# Patient Record
Sex: Male | Born: 1945 | Race: Black or African American | Hispanic: No | Smoking: Current every day smoker
Health system: Southern US, Community
[De-identification: ages and names within clinical notes are randomized; demographics above are authoritative.]

## PROBLEM LIST (undated history)

## (undated) DIAGNOSIS — B1920 Unspecified viral hepatitis C without hepatic coma: Secondary | ICD-10-CM

## (undated) DIAGNOSIS — E119 Type 2 diabetes mellitus without complications: Secondary | ICD-10-CM

## (undated) DIAGNOSIS — F209 Schizophrenia, unspecified: Secondary | ICD-10-CM

## (undated) DIAGNOSIS — I639 Cerebral infarction, unspecified: Secondary | ICD-10-CM

## (undated) HISTORY — PX: HERNIA REPAIR: SHX51

---

## 2006-07-08 ENCOUNTER — Emergency Department: Payer: Self-pay | Admitting: Emergency Medicine

## 2006-07-23 ENCOUNTER — Encounter: Payer: Self-pay | Admitting: Emergency Medicine

## 2008-06-28 ENCOUNTER — Emergency Department: Payer: Self-pay | Admitting: Internal Medicine

## 2008-07-31 ENCOUNTER — Emergency Department: Payer: Self-pay | Admitting: Emergency Medicine

## 2009-10-21 DIAGNOSIS — K219 Gastro-esophageal reflux disease without esophagitis: Secondary | ICD-10-CM | POA: Insufficient documentation

## 2012-07-31 DIAGNOSIS — G5603 Carpal tunnel syndrome, bilateral upper limbs: Secondary | ICD-10-CM | POA: Insufficient documentation

## 2012-07-31 DIAGNOSIS — M160 Bilateral primary osteoarthritis of hip: Secondary | ICD-10-CM | POA: Insufficient documentation

## 2012-07-31 DIAGNOSIS — E1142 Type 2 diabetes mellitus with diabetic polyneuropathy: Secondary | ICD-10-CM | POA: Insufficient documentation

## 2012-10-23 DIAGNOSIS — F1990 Other psychoactive substance use, unspecified, uncomplicated: Secondary | ICD-10-CM | POA: Insufficient documentation

## 2012-10-23 DIAGNOSIS — F329 Major depressive disorder, single episode, unspecified: Secondary | ICD-10-CM | POA: Insufficient documentation

## 2012-10-23 DIAGNOSIS — F209 Schizophrenia, unspecified: Secondary | ICD-10-CM | POA: Insufficient documentation

## 2013-03-21 DIAGNOSIS — Z23 Encounter for immunization: Secondary | ICD-10-CM | POA: Insufficient documentation

## 2013-03-21 DIAGNOSIS — B182 Chronic viral hepatitis C: Secondary | ICD-10-CM | POA: Insufficient documentation

## 2013-03-21 DIAGNOSIS — B1920 Unspecified viral hepatitis C without hepatic coma: Secondary | ICD-10-CM | POA: Insufficient documentation

## 2013-08-26 DIAGNOSIS — E1140 Type 2 diabetes mellitus with diabetic neuropathy, unspecified: Secondary | ICD-10-CM | POA: Insufficient documentation

## 2013-08-26 DIAGNOSIS — E1165 Type 2 diabetes mellitus with hyperglycemia: Secondary | ICD-10-CM | POA: Insufficient documentation

## 2013-08-30 DIAGNOSIS — D126 Benign neoplasm of colon, unspecified: Secondary | ICD-10-CM | POA: Insufficient documentation

## 2014-07-07 DIAGNOSIS — F1120 Opioid dependence, uncomplicated: Secondary | ICD-10-CM | POA: Insufficient documentation

## 2014-08-31 DIAGNOSIS — K610 Anal abscess: Secondary | ICD-10-CM | POA: Insufficient documentation

## 2015-03-10 DIAGNOSIS — Z206 Contact with and (suspected) exposure to human immunodeficiency virus [HIV]: Secondary | ICD-10-CM | POA: Insufficient documentation

## 2015-03-10 DIAGNOSIS — R2230 Localized swelling, mass and lump, unspecified upper limb: Secondary | ICD-10-CM | POA: Insufficient documentation

## 2015-03-10 DIAGNOSIS — F1910 Other psychoactive substance abuse, uncomplicated: Secondary | ICD-10-CM | POA: Insufficient documentation

## 2015-12-04 ENCOUNTER — Encounter: Payer: Self-pay | Admitting: Urology

## 2015-12-04 ENCOUNTER — Ambulatory Visit: Payer: Self-pay | Admitting: Urology

## 2016-03-14 ENCOUNTER — Encounter: Payer: Self-pay | Admitting: Emergency Medicine

## 2016-03-14 ENCOUNTER — Emergency Department: Payer: Medicare Other

## 2016-03-14 ENCOUNTER — Emergency Department
Admission: EM | Admit: 2016-03-14 | Discharge: 2016-03-14 | Disposition: A | Discharge status: Home / Self Care | Payer: Medicare Other | Attending: Emergency Medicine | Admitting: Emergency Medicine

## 2016-03-14 DIAGNOSIS — R1084 Generalized abdominal pain: Secondary | ICD-10-CM | POA: Diagnosis present

## 2016-03-14 DIAGNOSIS — R1011 Right upper quadrant pain: Secondary | ICD-10-CM

## 2016-03-14 DIAGNOSIS — F17200 Nicotine dependence, unspecified, uncomplicated: Secondary | ICD-10-CM | POA: Diagnosis not present

## 2016-03-14 DIAGNOSIS — R1013 Epigastric pain: Secondary | ICD-10-CM | POA: Diagnosis not present

## 2016-03-14 DIAGNOSIS — E119 Type 2 diabetes mellitus without complications: Secondary | ICD-10-CM | POA: Insufficient documentation

## 2016-03-14 HISTORY — DX: Type 2 diabetes mellitus without complications: E11.9

## 2016-03-14 LAB — CBC
HEMATOCRIT: 41.9 % (ref 40.0–52.0)
HEMOGLOBIN: 14.4 g/dL (ref 13.0–18.0)
MCH: 31.8 pg (ref 26.0–34.0)
MCHC: 34.3 g/dL (ref 32.0–36.0)
MCV: 92.6 fL (ref 80.0–100.0)
Platelets: 152 10*3/uL (ref 150–440)
RBC: 4.53 MIL/uL (ref 4.40–5.90)
RDW: 12.5 % (ref 11.5–14.5)
WBC: 9.5 10*3/uL (ref 3.8–10.6)

## 2016-03-14 LAB — COMPREHENSIVE METABOLIC PANEL
ALT: 60 U/L (ref 17–63)
ANION GAP: 6 (ref 5–15)
AST: 48 U/L — ABNORMAL HIGH (ref 15–41)
Albumin: 3.7 g/dL (ref 3.5–5.0)
Alkaline Phosphatase: 75 U/L (ref 38–126)
BILIRUBIN TOTAL: 0.9 mg/dL (ref 0.3–1.2)
BUN: 14 mg/dL (ref 6–20)
CO2: 31 mmol/L (ref 22–32)
Calcium: 9.4 mg/dL (ref 8.9–10.3)
Chloride: 100 mmol/L — ABNORMAL LOW (ref 101–111)
Creatinine, Ser: 0.83 mg/dL (ref 0.61–1.24)
Glucose, Bld: 201 mg/dL — ABNORMAL HIGH (ref 65–99)
POTASSIUM: 3.9 mmol/L (ref 3.5–5.1)
Sodium: 137 mmol/L (ref 135–145)
TOTAL PROTEIN: 7.4 g/dL (ref 6.5–8.1)

## 2016-03-14 LAB — URINALYSIS COMPLETE WITH MICROSCOPIC (ARMC ONLY)
BACTERIA UA: NONE SEEN
Bilirubin Urine: NEGATIVE
Glucose, UA: >500 mg/dL — AB
Hgb urine dipstick: NEGATIVE
LEUKOCYTES UA: NEGATIVE
Nitrite: NEGATIVE
PROTEIN: 100 mg/dL — AB
Specific Gravity, Urine: 1.024 (ref 1.005–1.030)
pH: 5 (ref 5.0–8.0)

## 2016-03-14 LAB — LIPASE, BLOOD: LIPASE: 43 U/L (ref 11–51)

## 2016-03-14 LAB — TROPONIN I: Troponin I: <0.03 ng/mL (ref <0.03)

## 2016-03-14 MED ORDER — ONDANSETRON HCL 4 MG PO TABS: 4.0000 mg | ORAL_TABLET | Freq: Every day | ORAL | 0 refills | Status: DC | PRN

## 2016-03-14 MED ORDER — ONDANSETRON HCL 4 MG/2ML IJ SOLN
4.0000 mg | Freq: Once | INTRAMUSCULAR | Status: AC
  Administered 2016-03-14: 4 mg via INTRAVENOUS
  Filled 2016-03-14: qty 2

## 2016-03-14 MED ORDER — SODIUM CHLORIDE 0.9 % IV BOLUS (SEPSIS)
1000.0000 mL | Freq: Once | INTRAVENOUS | Status: AC
  Administered 2016-03-14: 1000 mL via INTRAVENOUS

## 2016-03-14 MED ORDER — IOPAMIDOL (ISOVUE-300) INJECTION 61%
30.0000 mL | Freq: Once | INTRAVENOUS | Status: AC
  Administered 2016-03-14: 30 mL via ORAL

## 2016-03-14 MED ORDER — OXYCODONE-ACETAMINOPHEN 5-325 MG PO TABS: 1.0000 | ORAL_TABLET | Freq: Four times a day (QID) | ORAL | 0 refills | Status: DC | PRN

## 2016-03-14 MED ORDER — MORPHINE SULFATE (PF) 4 MG/ML IV SOLN
4.0000 mg | Freq: Once | INTRAVENOUS | Status: AC
  Administered 2016-03-14: 4 mg via INTRAVENOUS
  Filled 2016-03-14: qty 1

## 2016-03-14 MED ORDER — IOPAMIDOL (ISOVUE-300) INJECTION 61%
100.0000 mL | Freq: Once | INTRAVENOUS | Status: AC | PRN
  Administered 2016-03-14: 100 mL via INTRAVENOUS

## 2016-03-14 NOTE — ED Notes (Signed)
Assisted pt to bathroom. Collected urine sample to send to lab. Pt drank first bottle of contrast but unable to drink second.

## 2016-03-14 NOTE — ED Provider Notes (Signed)
[facility] Emergency Department Provider Note  ____________________________________________   I have reviewed the triage vital signs and the nursing notes.   HISTORY  Chief Complaint Abdominal Pain    HPI Lance [name] is a 70 y.o. male who states that he is here today complaining of diffuse abdominal discomfort. His finger going on for a day or 2. Denies any chest pain to me although he may have told the triage nurse he had some. He had a bowel movement today that seems slightly loose. Did not have any fever or chills. States the pain is mostly epigastric and some mild extent right upper quadrant. However to also "everywhere". He denies melena or bright red blood per rectum.    Past Medical History:  Diagnosis Date  . Diabetes mellitus without complication ([place])     There are no active problems to display for this patient.   History reviewed. No pertinent surgical history.  Prior to Admission medications   Not on File    Allergies Review of patient's allergies indicates no known allergies.  History reviewed. No pertinent family history.  Social History Social History  Substance Use Topics  . Smoking status: Current Every Day Smoker  . Smokeless tobacco: Not on file  . Alcohol use No    Review of Systems Constitutional: No fever/chills Eyes: No visual changes. ENT: No sore throat. No stiff neck no neck pain Cardiovascular: Denies chest pain. Respiratory: Denies shortness of breath. Gastrointestinal:   See history of present illness Genitourinary: Negative for dysuria. Musculoskeletal: Negative lower extremity swelling Skin: Negative for rash. Neurological: Negative for severe headaches, focal weakness or numbness. 10-point ROS otherwise negative.  ____________________________________________   PHYSICAL EXAM:  VITAL SIGNS: ED Triage Vitals  Enc Vitals Group     BP 03/14/16 1524 125/88     Pulse Rate 03/14/16 1524 69     Resp  03/14/16 1524 20     Temp 03/14/16 1524 98.4 F (36.9 C)     Temp Source 03/14/16 1524 Oral     SpO2 03/14/16 1524 98 %     Weight 03/14/16 1523 176 lb (79.8 kg)     Height 03/14/16 1523 5\' 9"  (1.753 m)     Head Circumference --      Peak Flow --      Pain Score 03/14/16 1524 10     Pain Loc --      Pain Edu? --      Excl. in [place]? --     Constitutional: Alert and oriented. Well appearing and in no acute distress. Eyes: Conjunctivae are normal. PERRL. EOMI. Head: Atraumatic. Nose: No congestion/rhinnorhea. Mouth/Throat: Mucous membranes are moist.  Oropharynx non-erythematous. Neck: No stridor.   Nontender with no meningismus Cardiovascular: Normal rate, regular rhythm. Grossly normal heart sounds.  Good peripheral circulation. Respiratory: Normal respiratory effort.  No retractions. Lungs CTAB. Abdominal: Soft and Mildly tender principally in the epigastric and right upper quadrant. No distention. No guarding no rebound Back:  There is no focal tenderness or step off.  there is no midline tenderness there are no lesions noted. there is no CVA tenderness Musculoskeletal: No lower extremity tenderness, no upper extremity tenderness. No joint effusions, no DVT signs strong distal pulses no edema Neurologic:  Normal speech and language. No gross focal neurologic deficits are appreciated.  Skin:  Skin is warm, dry and intact. No rash noted. Psychiatric: Mood and affect are normal. Speech and behavior are normal.  ____________________________________________   LABS (all labs  ordered are listed, but only abnormal results are displayed)  Labs Reviewed  COMPREHENSIVE METABOLIC PANEL - Abnormal; Notable for the following:       Result Value   Chloride 100 (*)    Glucose, Bld 201 (*)    AST 48 (*)    All other components within normal limits  URINALYSIS COMPLETEWITH MICROSCOPIC ([facility] ONLY) - Abnormal; Notable for the following:    Color, Urine YELLOW (*)    APPearance CLEAR (*)     Glucose, UA >500 (*)    Ketones, ur 1+ (*)    Protein, ur 100 (*)    Squamous Epithelial / LPF 0-5 (*)    All other components within normal limits  LIPASE, BLOOD  CBC  TROPONIN I   ____________________________________________  EKG  I personally interpreted any EKGs ordered by me or triage Sinus rhythm rate 65 bpm no acute ST elevation or acute ST depression borderline RAD. ____________________________________________  RADIOLOGY  I reviewed any imaging ordered by me or triage that were performed during my shift and, if possible, patient and/or family made aware of any abnormal findings. ____________________________________________   PROCEDURES  Procedure(s) performed: None  Procedures  Critical Care performed: None  ____________________________________________   INITIAL IMPRESSION / ASSESSMENT AND PLAN / ED COURSE  Pertinent labs & imaging results that were available during my care of the patient were reviewed by me and considered in my medical decision making (see chart for details).  Patient with reproducible Donald discomfort for a few days associated with vomiting and diarrhea. He was pain-free after a single dose of pain medication. He has not been vomiting. Serial abdominal exams are benign. This time is nothing to indicate the patient has "pain out of proportion to exam" or ischemic gut. CT scan is reassuring, ultrasound is reassuring, vital signs are reassuring white count and blood work is reassuring. Patient is tolerating by mouth with no difficulty here. He did not wish to drink another bottle of contrast however he states he could do it. We will continue by mouth challenge here. Extensive return precautions and follow-up given and understood. This time, there is no indication for acute intra-abdominal pathology. Patient remains with a benign abdomen. Nor certainly indication this reproducible abdominal pain and vomiting and loose stool is caused by ACS but  nonetheless we did do an EKG and a troponin which, despite 2-3 days of symptoms, is reassuring. Extensive return precautions and follow-up given and understood  Clinical Course   ____________________________________________   FINAL CLINICAL IMPRESSION(S) / ED DIAGNOSES  Final diagnoses:  RUQ pain      This chart was dictated using voice recognition software.  Despite best efforts to proofread,  errors can occur which can change meaning.      Schuyler Amor, MD 03/14/16 [phone]

## 2016-03-14 NOTE — ED Triage Notes (Signed)
Pt c/o generalized abdominal pain and nausea/vomiting. Reports chest pain last night but none today. Last BM today.

## 2016-03-16 ENCOUNTER — Emergency Department
Admission: EM | Admit: 2016-03-16 | Discharge: 2016-03-16 | Disposition: A | Discharge status: Home / Self Care | Payer: Medicare Other | Attending: Emergency Medicine | Admitting: Emergency Medicine

## 2016-03-16 DIAGNOSIS — K529 Noninfective gastroenteritis and colitis, unspecified: Secondary | ICD-10-CM | POA: Diagnosis not present

## 2016-03-16 DIAGNOSIS — R112 Nausea with vomiting, unspecified: Secondary | ICD-10-CM | POA: Diagnosis not present

## 2016-03-16 DIAGNOSIS — F17200 Nicotine dependence, unspecified, uncomplicated: Secondary | ICD-10-CM | POA: Diagnosis not present

## 2016-03-16 DIAGNOSIS — Z7984 Long term (current) use of oral hypoglycemic drugs: Secondary | ICD-10-CM | POA: Insufficient documentation

## 2016-03-16 DIAGNOSIS — E119 Type 2 diabetes mellitus without complications: Secondary | ICD-10-CM | POA: Diagnosis not present

## 2016-03-16 DIAGNOSIS — R1084 Generalized abdominal pain: Secondary | ICD-10-CM

## 2016-03-16 DIAGNOSIS — R109 Unspecified abdominal pain: Secondary | ICD-10-CM | POA: Diagnosis present

## 2016-03-16 DIAGNOSIS — Z79899 Other long term (current) drug therapy: Secondary | ICD-10-CM | POA: Insufficient documentation

## 2016-03-16 LAB — URINALYSIS COMPLETE WITH MICROSCOPIC (ARMC ONLY)
Bilirubin Urine: NEGATIVE
Glucose, UA: >500 mg/dL — AB
Hgb urine dipstick: NEGATIVE
Leukocytes, UA: NEGATIVE
NITRITE: NEGATIVE
PROTEIN: 100 mg/dL — AB
Specific Gravity, Urine: 1.031 — ABNORMAL HIGH (ref 1.005–1.030)
pH: 5 (ref 5.0–8.0)

## 2016-03-16 LAB — CBC WITH DIFFERENTIAL/PLATELET
Basophils Absolute: 0 10*3/uL (ref 0–0.1)
Basophils Relative: 0 %
EOS ABS: 0 10*3/uL (ref 0–0.7)
EOS PCT: 0 %
HCT: 43.5 % (ref 40.0–52.0)
Hemoglobin: 15.4 g/dL (ref 13.0–18.0)
LYMPHS ABS: 1.5 10*3/uL (ref 1.0–3.6)
LYMPHS PCT: 19 %
MCH: 32.3 pg (ref 26.0–34.0)
MCHC: 35.3 g/dL (ref 32.0–36.0)
MCV: 91.4 fL (ref 80.0–100.0)
MONO ABS: 0.7 10*3/uL (ref 0.2–1.0)
MONOS PCT: 9 %
Neutro Abs: 5.8 10*3/uL (ref 1.4–6.5)
Neutrophils Relative %: 72 %
PLATELETS: 175 10*3/uL (ref 150–440)
RBC: 4.76 MIL/uL (ref 4.40–5.90)
RDW: 12.7 % (ref 11.5–14.5)
WBC: 8.1 10*3/uL (ref 3.8–10.6)

## 2016-03-16 LAB — COMPREHENSIVE METABOLIC PANEL
ALT: 66 U/L — AB (ref 17–63)
ANION GAP: 8 (ref 5–15)
AST: 54 U/L — ABNORMAL HIGH (ref 15–41)
Albumin: 3.7 g/dL (ref 3.5–5.0)
Alkaline Phosphatase: 77 U/L (ref 38–126)
BUN: 17 mg/dL (ref 6–20)
CALCIUM: 9.1 mg/dL (ref 8.9–10.3)
CHLORIDE: 97 mmol/L — AB (ref 101–111)
CO2: 30 mmol/L (ref 22–32)
CREATININE: 0.81 mg/dL (ref 0.61–1.24)
Glucose, Bld: 177 mg/dL — ABNORMAL HIGH (ref 65–99)
Potassium: 3.9 mmol/L (ref 3.5–5.1)
Sodium: 135 mmol/L (ref 135–145)
Total Bilirubin: 1.1 mg/dL (ref 0.3–1.2)
Total Protein: 8.1 g/dL (ref 6.5–8.1)

## 2016-03-16 LAB — LIPASE, BLOOD: LIPASE: 24 U/L (ref 11–51)

## 2016-03-16 MED ORDER — DICYCLOMINE HCL 20 MG PO TABS: 20.0000 mg | ORAL_TABLET | Freq: Three times a day (TID) | ORAL | 0 refills | Status: DC | PRN

## 2016-03-16 MED ORDER — DICYCLOMINE HCL 10 MG/ML IM SOLN
20.0000 mg | Freq: Once | INTRAMUSCULAR | Status: AC
Start: 2016-03-16 — End: 2016-03-16
  Administered 2016-03-16: 20 mg via INTRAMUSCULAR
  Filled 2016-03-16: qty 2

## 2016-03-16 MED ORDER — METRONIDAZOLE 500 MG PO TABS
500.0000 mg | ORAL_TABLET | Freq: Once | ORAL | Status: AC
  Administered 2016-03-16: 500 mg via ORAL
  Filled 2016-03-16: qty 1

## 2016-03-16 MED ORDER — OXYCODONE-ACETAMINOPHEN 5-325 MG PO TABS
1.0000 | ORAL_TABLET | Freq: Once | ORAL | Status: AC
  Administered 2016-03-16: 1 via ORAL
  Filled 2016-03-16: qty 1

## 2016-03-16 MED ORDER — METRONIDAZOLE 500 MG PO TABS: 500.0000 mg | ORAL_TABLET | Freq: Three times a day (TID) | ORAL | 0 refills | Status: AC

## 2016-03-16 MED ORDER — CIPROFLOXACIN HCL 500 MG PO TABS
500.0000 mg | ORAL_TABLET | Freq: Two times a day (BID) | ORAL | 0 refills | Status: AC
Start: 2016-03-16 — End: 2016-03-26

## 2016-03-16 MED ORDER — ONDANSETRON HCL 4 MG/2ML IJ SOLN
4.0000 mg | Freq: Once | INTRAMUSCULAR | Status: AC
  Administered 2016-03-16: 4 mg via INTRAVENOUS
  Filled 2016-03-16: qty 2

## 2016-03-16 MED ORDER — ONDANSETRON HCL 4 MG PO TABS: 4.0000 mg | ORAL_TABLET | Freq: Three times a day (TID) | ORAL | 0 refills | Status: DC | PRN

## 2016-03-16 MED ORDER — CIPROFLOXACIN HCL 500 MG PO TABS
500.0000 mg | ORAL_TABLET | Freq: Once | ORAL | Status: AC
  Administered 2016-03-16: 500 mg via ORAL
  Filled 2016-03-16: qty 1

## 2016-03-16 NOTE — ED Provider Notes (Signed)
[facility] Emergency Department Provider Note   ____________________________________________   I have reviewed the triage vital signs and the nursing notes.   HISTORY  Chief Complaint Abdominal Pain   History limited by: Not Limited   HPI Lance [name] is a 70 y.o. male who presents to the emergency department today with concerns for continued abdominal pain, nausea and vomiting. The patient was seen in the emergency department 2 days ago for these complaints. At that time he had blood work and CT scan performed. CT scan showed some mild possible inflammation of the colon consistent with colitis. Patient states he was sent home on pain medication and nausea medication. He states that he finished those courses and that the symptoms have persisted. He denies any fevers. Denies any bloody vomit or bloody diarrhea.    Past Medical History:  Diagnosis Date  . Diabetes mellitus without complication ([place])     There are no active problems to display for this patient.   History reviewed. No pertinent surgical history.  Prior to Admission medications   Medication Sig Start Date End Date Taking? Authorizing Provider  gabapentin (NEURONTIN) 300 MG capsule Take 300 mg by mouth 3 (three) times daily. 03/04/16  Yes Historical Provider, MD  JANUVIA 100 MG tablet Take 100 mg by mouth daily. 03/06/16  Yes Historical Provider, MD  metFORMIN (GLUCOPHAGE-XR) 500 MG 24 hr tablet Take 500 mg by mouth daily. 02/04/16  Yes Historical Provider, MD  Multiple Vitamins-Minerals (GNP MENS MULTIPLUS PO) Take 1 tablet by mouth daily.   Yes Historical Provider, MD  ondansetron (ZOFRAN) 4 MG tablet Take 1 tablet (4 mg total) by mouth daily as needed for nausea or vomiting. 03/14/16  Yes Schuyler Amor, MD  oxyCODONE-acetaminophen (ROXICET) 5-325 MG tablet Take 1 tablet by mouth every 6 (six) hours as needed for severe pain. 03/14/16  Yes Schuyler Amor, MD    Allergies Review of  patient's allergies indicates no known allergies.  No family history on file.  Social History Social History  Substance Use Topics  . Smoking status: Current Every Day Smoker  . Smokeless tobacco: Not on file  . Alcohol use No    Review of Systems  Constitutional: Negative for fever. Cardiovascular: Negative for chest pain. Respiratory: Negative for shortness of breath. Gastrointestinal: Positive for abdominal pain, nausea and vomiting Neurological: Negative for headaches, focal weakness or numbness.  10-point ROS otherwise negative.  ____________________________________________   PHYSICAL EXAM:  VITAL SIGNS: ED Triage Vitals  Enc Vitals Group     BP 03/16/16 1552 (!) 163/87     Pulse Rate 03/16/16 1552 70     Resp 03/16/16 1552 18     Temp 03/16/16 1552 98.1 F (36.7 C)     Temp Source 03/16/16 1552 Oral     SpO2 03/16/16 1552 98 %     Weight 03/16/16 1548 176 lb (79.8 kg)     Height 03/16/16 1548 5\' 9"  (1.753 m)     Head Circumference --      Peak Flow --      Pain Score 03/16/16 1548 10   Constitutional: Alert and oriented. Well appearing and in no distress. Eyes: Conjunctivae are normal. Normal extraocular movements. ENT   Head: Normocephalic and atraumatic.   Nose: No congestion/rhinnorhea.   Mouth/Throat: Mucous membranes are moist.   Neck: No stridor. Hematological/Lymphatic/Immunilogical: No cervical lymphadenopathy. Cardiovascular: Normal rate, regular rhythm.  No murmurs, rubs, or gallops. Respiratory: Normal respiratory effort without tachypnea nor retractions.  Breath sounds are clear and equal bilaterally. No wheezes/rales/rhonchi. Gastrointestinal: Soft and minimally tender to palpation. No rebound. No guarding.  Genitourinary: Deferred Musculoskeletal: Normal range of motion in all extremities. No lower extremity edema. Neurologic:  Normal speech and language. No gross focal neurologic deficits are appreciated.  Skin:  Skin is warm,  dry and intact. No rash noted. Psychiatric: Mood and affect are normal. Speech and behavior are normal. Patient exhibits appropriate insight and judgment.  ____________________________________________    LABS (pertinent positives/negatives)  Labs Reviewed  COMPREHENSIVE METABOLIC PANEL - Abnormal; Notable for the following:       Result Value   Chloride 97 (*)    Glucose, Bld 177 (*)    AST 54 (*)    ALT 66 (*)    All other components within normal limits  URINALYSIS COMPLETEWITH MICROSCOPIC ([facility] ONLY) - Abnormal; Notable for the following:    Color, Urine AMBER (*)    APPearance CLEAR (*)    Glucose, UA >500 (*)    Ketones, ur 2+ (*)    Specific Gravity, Urine 1.031 (*)    Protein, ur 100 (*)    Bacteria, UA RARE (*)    Squamous Epithelial / LPF 0-5 (*)    All other components within normal limits  CBC WITH DIFFERENTIAL/PLATELET  LIPASE, BLOOD     ____________________________________________   EKG  None  ____________________________________________    RADIOLOGY  None  ____________________________________________   PROCEDURES  Procedures  ____________________________________________   INITIAL IMPRESSION / ASSESSMENT AND PLAN / ED COURSE  Pertinent labs & imaging results that were available during my care of the patient were reviewed by me and considered in my medical decision making (see chart for details).  Patient presented to the emergency department today with concerns for continuing abdominal pain. Blood work today without any concerning leukocytosis. CT scan from 2 days ago showed a possible colitis. Given that the patient still has symptoms will place patient on antibiotics. ____________________________________________   FINAL CLINICAL IMPRESSION(S) / ED DIAGNOSES  Final diagnoses:  Generalized abdominal pain  Colitis     Note: This dictation was prepared with Dragon dictation. Any transcriptional errors that result from this process  are unintentional    Nance Pear, MD 03/16/16 1831

## 2016-03-16 NOTE — Discharge Instructions (Signed)
Please seek medical attention for any high fevers, chest pain, shortness of breath, change in behavior, persistent vomiting, bloody stool or any other new or concerning symptoms.  

## 2016-03-16 NOTE — ED Notes (Signed)
Family called again for pt but no answer, taxi called for pt at request, [facility] open pharmacy found and directions and contact given to pt

## 2016-03-16 NOTE — ED Triage Notes (Signed)
Pt from home via EMS, reports diffuse abd pain N/V. Was seen here on the 5th for same but symptoms have not resolved

## 2016-08-26 ENCOUNTER — Emergency Department: Payer: Medicare Other

## 2016-08-26 ENCOUNTER — Encounter: Payer: Self-pay | Admitting: Intensive Care

## 2016-08-26 ENCOUNTER — Emergency Department
Admission: EM | Admit: 2016-08-26 | Discharge: 2016-08-26 | Disposition: A | Discharge status: Home / Self Care | Payer: Medicare Other | Attending: Emergency Medicine | Admitting: Emergency Medicine

## 2016-08-26 DIAGNOSIS — R079 Chest pain, unspecified: Secondary | ICD-10-CM | POA: Diagnosis not present

## 2016-08-26 DIAGNOSIS — F17200 Nicotine dependence, unspecified, uncomplicated: Secondary | ICD-10-CM | POA: Insufficient documentation

## 2016-08-26 DIAGNOSIS — E119 Type 2 diabetes mellitus without complications: Secondary | ICD-10-CM | POA: Diagnosis not present

## 2016-08-26 DIAGNOSIS — Z5321 Procedure and treatment not carried out due to patient leaving prior to being seen by health care provider: Secondary | ICD-10-CM | POA: Insufficient documentation

## 2016-08-26 DIAGNOSIS — Z7984 Long term (current) use of oral hypoglycemic drugs: Secondary | ICD-10-CM | POA: Insufficient documentation

## 2016-08-26 LAB — CBC
HCT: 38.3 % — ABNORMAL LOW (ref 40.0–52.0)
HEMOGLOBIN: 13.2 g/dL (ref 13.0–18.0)
MCH: 31.9 pg (ref 26.0–34.0)
MCHC: 34.4 g/dL (ref 32.0–36.0)
MCV: 92.6 fL (ref 80.0–100.0)
PLATELETS: 136 10*3/uL — AB (ref 150–440)
RBC: 4.14 MIL/uL — AB (ref 4.40–5.90)
RDW: 12.8 % (ref 11.5–14.5)
WBC: 7.1 10*3/uL (ref 3.8–10.6)

## 2016-08-26 LAB — BASIC METABOLIC PANEL
Anion gap: 7 (ref 5–15)
BUN: 20 mg/dL (ref 6–20)
CHLORIDE: 101 mmol/L (ref 101–111)
CO2: 29 mmol/L (ref 22–32)
CREATININE: 0.6 mg/dL — AB (ref 0.61–1.24)
Calcium: 9.1 mg/dL (ref 8.9–10.3)
GFR calc Af Amer: >60 mL/min (ref >60)
GFR calc non Af Amer: >60 mL/min (ref >60)
GLUCOSE: 285 mg/dL — AB (ref 65–99)
Potassium: 4.2 mmol/L (ref 3.5–5.1)
Sodium: 137 mmol/L (ref 135–145)

## 2016-08-26 LAB — ETHANOL: Alcohol, Ethyl (B): <5 mg/dL (ref <5)

## 2016-08-26 LAB — TROPONIN I: Troponin I: <0.03 ng/mL (ref <0.03)

## 2016-08-26 NOTE — ED Triage Notes (Signed)
Patient presents to ER for L sided chest pain and abdominal pain X1 week. Reports it feels like something is sitting on his chest. Patient has been falling asleep during triage. NAD noted in triage.Speech clear

## 2016-08-27 ENCOUNTER — Telehealth: Payer: Self-pay | Admitting: Emergency Medicine

## 2016-08-27 NOTE — Telephone Encounter (Signed)
Called patient due to lwot to inquire about condition and follow up plans. Name on voicemail is not patients, so I did not leave a message.

## 2016-10-06 ENCOUNTER — Emergency Department: Payer: Medicare Other

## 2016-10-06 ENCOUNTER — Emergency Department
Admission: EM | Admit: 2016-10-06 | Discharge: 2016-10-06 | Disposition: A | Discharge status: Home / Self Care | Payer: Medicare Other | Attending: Emergency Medicine | Admitting: Emergency Medicine

## 2016-10-06 ENCOUNTER — Encounter: Payer: Self-pay | Admitting: Emergency Medicine

## 2016-10-06 DIAGNOSIS — E1165 Type 2 diabetes mellitus with hyperglycemia: Secondary | ICD-10-CM | POA: Insufficient documentation

## 2016-10-06 DIAGNOSIS — Z79899 Other long term (current) drug therapy: Secondary | ICD-10-CM | POA: Insufficient documentation

## 2016-10-06 DIAGNOSIS — Z7984 Long term (current) use of oral hypoglycemic drugs: Secondary | ICD-10-CM | POA: Insufficient documentation

## 2016-10-06 DIAGNOSIS — R1033 Periumbilical pain: Secondary | ICD-10-CM

## 2016-10-06 DIAGNOSIS — F17200 Nicotine dependence, unspecified, uncomplicated: Secondary | ICD-10-CM | POA: Insufficient documentation

## 2016-10-06 DIAGNOSIS — R112 Nausea with vomiting, unspecified: Secondary | ICD-10-CM

## 2016-10-06 DIAGNOSIS — R739 Hyperglycemia, unspecified: Secondary | ICD-10-CM

## 2016-10-06 LAB — CBC
HEMATOCRIT: 49 % (ref 40.0–52.0)
Hemoglobin: 16.6 g/dL (ref 13.0–18.0)
MCH: 31.5 pg (ref 26.0–34.0)
MCHC: 33.8 g/dL (ref 32.0–36.0)
MCV: 93.3 fL (ref 80.0–100.0)
Platelets: 176 10*3/uL (ref 150–440)
RBC: 5.25 MIL/uL (ref 4.40–5.90)
RDW: 12.8 % (ref 11.5–14.5)
WBC: 9.7 10*3/uL (ref 3.8–10.6)

## 2016-10-06 LAB — COMPREHENSIVE METABOLIC PANEL
ALT: 90 U/L — ABNORMAL HIGH (ref 17–63)
AST: 68 U/L — ABNORMAL HIGH (ref 15–41)
Albumin: 3.9 g/dL (ref 3.5–5.0)
Alkaline Phosphatase: 82 U/L (ref 38–126)
Anion gap: 10 (ref 5–15)
BILIRUBIN TOTAL: 1.2 mg/dL (ref 0.3–1.2)
BUN: 16 mg/dL (ref 6–20)
CHLORIDE: 96 mmol/L — AB (ref 101–111)
CO2: 30 mmol/L (ref 22–32)
Calcium: 9.3 mg/dL (ref 8.9–10.3)
Creatinine, Ser: 0.86 mg/dL (ref 0.61–1.24)
Glucose, Bld: 206 mg/dL — ABNORMAL HIGH (ref 65–99)
POTASSIUM: 4 mmol/L (ref 3.5–5.1)
Sodium: 136 mmol/L (ref 135–145)
TOTAL PROTEIN: 8.5 g/dL — AB (ref 6.5–8.1)

## 2016-10-06 LAB — URINALYSIS, COMPLETE (UACMP) WITH MICROSCOPIC
BACTERIA UA: NONE SEEN
BILIRUBIN URINE: NEGATIVE
Glucose, UA: >=500 mg/dL — AB
HGB URINE DIPSTICK: NEGATIVE
KETONES UR: 20 mg/dL — AB
LEUKOCYTES UA: NEGATIVE
NITRITE: NEGATIVE
Protein, ur: 100 mg/dL — AB
SPECIFIC GRAVITY, URINE: 1.039 — AB (ref 1.005–1.030)
pH: 5 (ref 5.0–8.0)

## 2016-10-06 LAB — LIPASE, BLOOD: Lipase: 37 U/L (ref 11–51)

## 2016-10-06 LAB — GLUCOSE, CAPILLARY: Glucose-Capillary: 201 mg/dL — ABNORMAL HIGH (ref 65–99)

## 2016-10-06 LAB — TROPONIN I: Troponin I: <0.03 ng/mL (ref <0.03)

## 2016-10-06 MED ORDER — ONDANSETRON HCL 4 MG/2ML IJ SOLN
4.0000 mg | Freq: Once | INTRAMUSCULAR | Status: AC | PRN
  Administered 2016-10-06: 4 mg via INTRAVENOUS
  Filled 2016-10-06: qty 2

## 2016-10-06 MED ORDER — IOPAMIDOL (ISOVUE-300) INJECTION 61%
30.0000 mL | Freq: Once | INTRAVENOUS | Status: AC | PRN
  Administered 2016-10-06: 30 mL via ORAL

## 2016-10-06 MED ORDER — ONDANSETRON HCL 4 MG PO TABS: 4.0000 mg | ORAL_TABLET | Freq: Three times a day (TID) | ORAL | 0 refills | Status: DC | PRN

## 2016-10-06 MED ORDER — HYDROCODONE-ACETAMINOPHEN 5-325 MG PO TABS: 1.0000 | ORAL_TABLET | ORAL | 0 refills | Status: DC | PRN

## 2016-10-06 MED ORDER — SODIUM CHLORIDE 0.9 % IV BOLUS (SEPSIS)
1000.0000 mL | Freq: Once | INTRAVENOUS | Status: AC
  Administered 2016-10-06: 1000 mL via INTRAVENOUS

## 2016-10-06 MED ORDER — IOPAMIDOL (ISOVUE-300) INJECTION 61%
100.0000 mL | Freq: Once | INTRAVENOUS | Status: AC | PRN
  Administered 2016-10-06: 100 mL via INTRAVENOUS

## 2016-10-06 MED ORDER — MORPHINE SULFATE (PF) 4 MG/ML IV SOLN
4.0000 mg | Freq: Once | INTRAVENOUS | Status: AC
  Administered 2016-10-06: 4 mg via INTRAVENOUS
  Filled 2016-10-06: qty 1

## 2016-10-06 MED ORDER — ONDANSETRON HCL 4 MG/2ML IJ SOLN
4.0000 mg | Freq: Once | INTRAMUSCULAR | Status: AC
  Administered 2016-10-06: 4 mg via INTRAVENOUS
  Filled 2016-10-06: qty 2

## 2016-10-06 NOTE — ED Triage Notes (Signed)
Pt arrived via EMS from home at a boarding house with reports of N/V x 2 days and constipation x 2 days.  Hx of diabetes. C/o abdominal pain generalized and in the RLQ. Afebrile

## 2016-10-06 NOTE — Discharge Instructions (Signed)
Please call the number provided for GI medicine to arrange a follow-up appointment as soon as possible. Please take your pain medication as needed, as prescribed for pain. Do not drink alcohol or drive while taking this medication. Return to the emergency department for any significant worsening of pain, fever, or any other symptom personally concerning to yourself.

## 2016-10-06 NOTE — ED Provider Notes (Signed)
[facility] Emergency Department Provider Note ____________________________________________   I have reviewed the triage vital signs and the triage nursing note.  HISTORY  Chief Complaint Abdominal Pain; Emesis; and Nausea   Historian Patient  HPI Halford Goetzke is a 71 y.o. male with diabetes, takes metformin, reports 2 days of nausea and vomiting and abdominal pain.  Watery vomiting, nonbilious has some blood streaks in it. Pain in abdomen mid abdomen, and described as crampy and waxing and waning. Denies diarrhea. Denies bad food. States that he's felt like this before at times with diabetes, but does not believe he's been diagnosed with gastroparesis.  No chest pain or trouble breathing. Denies headache. Denies fevers.   Past Medical History:  Diagnosis Date  . Diabetes mellitus without complication ([place])     There are no active problems to display for this patient.   History reviewed. No pertinent surgical history.  Prior to Admission medications   Medication Sig Start Date End Date Taking? Authorizing Provider  dicyclomine (BENTYL) 20 MG tablet Take 1 tablet (20 mg total) by mouth 3 (three) times daily as needed (abdominal pain). 03/16/16   Nance Pear, MD  gabapentin (NEURONTIN) 300 MG capsule Take 300 mg by mouth 3 (three) times daily. 03/04/16   Historical Provider, MD  JANUVIA 100 MG tablet Take 100 mg by mouth daily. 03/06/16   Historical Provider, MD  metFORMIN (GLUCOPHAGE-XR) 500 MG 24 hr tablet Take 500 mg by mouth daily. 02/04/16   Historical Provider, MD  Multiple Vitamins-Minerals (GNP MENS MULTIPLUS PO) Take 1 tablet by mouth daily.    Historical Provider, MD  ondansetron (ZOFRAN) 4 MG tablet Take 1 tablet (4 mg total) by mouth every 8 (eight) hours as needed for nausea or vomiting. 10/06/16   Lisa Roca, MD  oxyCODONE-acetaminophen (ROXICET) 5-325 MG tablet Take 1 tablet by mouth every 6 (six) hours as needed for severe pain. 03/14/16    Schuyler Amor, MD    No Known Allergies  History reviewed. No pertinent family history.  Social History Social History  Substance Use Topics  . Smoking status: Current Every Day Smoker  . Smokeless tobacco: Never Used  . Alcohol use 6.0 oz/week    10 Shots of liquor per week    Review of Systems  Constitutional: Negative for fever. Eyes: Negative for visual changes. ENT: Negative for sore throat. Cardiovascular: Negative for chest pain. Respiratory: Negative for shortness of breath. Gastrointestinal: Positive as per history of present illness Genitourinary: Negative for dysuria. Musculoskeletal: Negative for back pain. Skin: Negative for rash. Neurological: Negative for headache. 10 point Review of Systems otherwise negative ____________________________________________   PHYSICAL EXAM:  VITAL SIGNS: ED Triage Vitals  Enc Vitals Group     BP 10/06/16 1116 (!) 174/97     Pulse Rate 10/06/16 1116 95     Resp 10/06/16 1116 (!) 24     Temp 10/06/16 1116 97.9 F (36.6 C)     Temp Source 10/06/16 1116 Oral     SpO2 10/06/16 1116 100 %     Weight 10/06/16 1116 166 lb (75.3 kg)     Height 10/06/16 1116 5\' 9"  (1.753 m)     Head Circumference --      Peak Flow --      Pain Score 10/06/16 1113 10     Pain Loc --      Pain Edu? --      Excl. in [place]? --      Constitutional: Alert and  oriented. Well appearing and in no distress. HEENT   Head: Normocephalic and atraumatic.      Eyes: Conjunctivae are normal. PERRL. Normal extraocular movements.      Ears:         Nose: No congestion/rhinnorhea.   Mouth/Throat: Mucous membranes are Moderately dry.   Neck: No stridor. Cardiovascular/Chest: Normal rate, regular rhythm.  No murmurs, rubs, or gallops. Respiratory: Normal respiratory effort without tachypnea nor retractions. Breath sounds are clear and equal bilaterally. No wheezes/rales/rhonchi. Gastrointestinal: Soft. No distention, no guarding, no rebound.  Moderate tenderness mid abdomen.  Genitourinary/rectal:Deferred Musculoskeletal: Nontender with normal range of motion in all extremities. No joint effusions.  No lower extremity tenderness.  No edema. Neurologic:  Normal speech and language. No gross or focal neurologic deficits are appreciated. Skin:  Skin is warm, dry and intact. No rash noted. Psychiatric: Mood and affect are normal. Speech and behavior are normal. Patient exhibits appropriate insight and judgment.   ____________________________________________  LABS (pertinent positives/negatives)  Labs Reviewed  COMPREHENSIVE METABOLIC PANEL - Abnormal; Notable for the following:       Result Value   Chloride 96 (*)    Glucose, Bld 206 (*)    Total Protein 8.5 (*)    AST 68 (*)    ALT 90 (*)    All other components within normal limits  URINALYSIS, COMPLETE ([facility]) WITH MICROSCOPIC - Abnormal; Notable for the following:    Color, Urine AMBER (*)    APPearance HAZY (*)    Specific Gravity, Urine 1.039 (*)    Glucose, UA >=500 (*)    Ketones, ur 20 (*)    Protein, ur 100 (*)    Squamous Epithelial / LPF 0-5 (*)    All other components within normal limits  GLUCOSE, CAPILLARY - Abnormal; Notable for the following:    Glucose-Capillary 201 (*)    All other components within normal limits  LIPASE, BLOOD  CBC  TROPONIN I    ____________________________________________    EKG I, Lisa Roca, MD, the attending physician have personally viewed and interpreted all ECGs.  72 bpm. Normal sinus rhythm. Narrow QRS Normal axis. Normal ST and T-wave ____________________________________________  RADIOLOGY All Xrays were viewed by me. Imaging interpreted by Radiologist.  CT abdomen and pelvis with contrast: Pending __________________________________________  PROCEDURES  Procedure(s) performed: None  Critical Care performed: None  ____________________________________________   ED COURSE / ASSESSMENT AND  PLAN  Pertinent labs & imaging results that were available during my care of the patient were reviewed by me and considered in my medical decision making (see chart for details).    Mr. Mcalexander is here with vomiting abdominal pain and cramping for 2 days. He does look somewhat clinically dehydrated. I am starting IV fluid resuscitation. No vital sign abnormalities.  Laboratory studies are reassuring.  On exam he continued to have moderate mid abdominal pain, I discussed with him risks versus benefit of obtaining CT for evaluation and he does want proceed.  Patient care transferred to Dr. Kerman Passey at shift change 3 PM. CT scan result pending. Disposition upon CT results and patient's examination.    CONSULTATIONS: None  Patient / Family / Caregiver informed of clinical course, medical decision-making process, and agree with plan.    ___________________________________________   FINAL CLINICAL IMPRESSION(S) / ED DIAGNOSES   Final diagnoses:  Non-intractable vomiting with nausea, unspecified vomiting type  Hyperglycemia  Abdominal pain, periumbilical              Note: This  dictation was prepared with Dragon dictation. Any transcriptional errors that result from this process are unintentional    Lisa Roca, MD 10/06/16 1506

## 2016-10-06 NOTE — ED Provider Notes (Signed)
-----------------------------------------   5:20 PM on 10/06/2016 -----------------------------------------  Patient CT scan shows likely biliary sludge with mild LFT elevation we'll obtain an ultrasound to further evaluate.  Ultrasound is normal. We'll discharge with a short course of pain medication. Patient will follow up with GI medicine. Patient agreeable to this plan.   Harvest Dark, MD 10/06/16 [phone]

## 2016-10-10 ENCOUNTER — Encounter
Admission: EM | Disposition: A | Discharge status: Home / Self Care | Payer: Self-pay | Source: Home / Self Care | Attending: Internal Medicine

## 2016-10-10 ENCOUNTER — Inpatient Hospital Stay: Payer: Medicare Other | Admitting: Certified Registered"

## 2016-10-10 ENCOUNTER — Inpatient Hospital Stay
Admission: EM | Admit: 2016-10-10 | Discharge: 2016-10-14 | DRG: 357 | Disposition: A | Discharge status: Home / Self Care | Payer: Medicare Other | Attending: Internal Medicine | Admitting: Internal Medicine

## 2016-10-10 ENCOUNTER — Encounter: Payer: Self-pay | Admitting: Emergency Medicine

## 2016-10-10 DIAGNOSIS — E860 Dehydration: Secondary | ICD-10-CM | POA: Diagnosis present

## 2016-10-10 DIAGNOSIS — F17200 Nicotine dependence, unspecified, uncomplicated: Secondary | ICD-10-CM | POA: Diagnosis present

## 2016-10-10 DIAGNOSIS — K254 Chronic or unspecified gastric ulcer with hemorrhage: Secondary | ICD-10-CM | POA: Diagnosis present

## 2016-10-10 DIAGNOSIS — K922 Gastrointestinal hemorrhage, unspecified: Secondary | ICD-10-CM | POA: Diagnosis present

## 2016-10-10 DIAGNOSIS — B182 Chronic viral hepatitis C: Secondary | ICD-10-CM | POA: Diagnosis present

## 2016-10-10 DIAGNOSIS — F209 Schizophrenia, unspecified: Secondary | ICD-10-CM | POA: Diagnosis present

## 2016-10-10 DIAGNOSIS — E875 Hyperkalemia: Secondary | ICD-10-CM | POA: Diagnosis present

## 2016-10-10 DIAGNOSIS — Z7984 Long term (current) use of oral hypoglycemic drugs: Secondary | ICD-10-CM

## 2016-10-10 DIAGNOSIS — Z79899 Other long term (current) drug therapy: Secondary | ICD-10-CM

## 2016-10-10 DIAGNOSIS — Z79891 Long term (current) use of opiate analgesic: Secondary | ICD-10-CM

## 2016-10-10 DIAGNOSIS — D62 Acute posthemorrhagic anemia: Secondary | ICD-10-CM | POA: Diagnosis present

## 2016-10-10 DIAGNOSIS — Z23 Encounter for immunization: Secondary | ICD-10-CM

## 2016-10-10 DIAGNOSIS — K2970 Gastritis, unspecified, without bleeding: Secondary | ICD-10-CM | POA: Diagnosis present

## 2016-10-10 DIAGNOSIS — I959 Hypotension, unspecified: Secondary | ICD-10-CM

## 2016-10-10 DIAGNOSIS — E1165 Type 2 diabetes mellitus with hyperglycemia: Secondary | ICD-10-CM | POA: Diagnosis present

## 2016-10-10 DIAGNOSIS — K264 Chronic or unspecified duodenal ulcer with hemorrhage: Principal | ICD-10-CM | POA: Diagnosis present

## 2016-10-10 DIAGNOSIS — Z833 Family history of diabetes mellitus: Secondary | ICD-10-CM | POA: Diagnosis not present

## 2016-10-10 DIAGNOSIS — N179 Acute kidney failure, unspecified: Secondary | ICD-10-CM | POA: Diagnosis present

## 2016-10-10 DIAGNOSIS — D72829 Elevated white blood cell count, unspecified: Secondary | ICD-10-CM | POA: Diagnosis present

## 2016-10-10 DIAGNOSIS — E861 Hypovolemia: Secondary | ICD-10-CM | POA: Diagnosis present

## 2016-10-10 DIAGNOSIS — Z8719 Personal history of other diseases of the digestive system: Secondary | ICD-10-CM

## 2016-10-10 DIAGNOSIS — E119 Type 2 diabetes mellitus without complications: Secondary | ICD-10-CM | POA: Diagnosis not present

## 2016-10-10 HISTORY — DX: Schizophrenia, unspecified: F20.9

## 2016-10-10 HISTORY — PX: ESOPHAGOGASTRODUODENOSCOPY (EGD) WITH PROPOFOL: SHX5813

## 2016-10-10 HISTORY — DX: Unspecified viral hepatitis C without hepatic coma: B19.20

## 2016-10-10 LAB — COMPREHENSIVE METABOLIC PANEL
ALBUMIN: 2.9 g/dL — AB (ref 3.5–5.0)
ALT: 62 U/L (ref 17–63)
ANION GAP: 13 (ref 5–15)
AST: 47 U/L — ABNORMAL HIGH (ref 15–41)
Alkaline Phosphatase: 60 U/L (ref 38–126)
BUN: 61 mg/dL — ABNORMAL HIGH (ref 6–20)
CHLORIDE: 97 mmol/L — AB (ref 101–111)
CO2: 23 mmol/L (ref 22–32)
Calcium: 8.1 mg/dL — ABNORMAL LOW (ref 8.9–10.3)
Creatinine, Ser: 1.57 mg/dL — ABNORMAL HIGH (ref 0.61–1.24)
GFR calc Af Amer: 51 mL/min — ABNORMAL LOW (ref >60)
GFR calc non Af Amer: 44 mL/min — ABNORMAL LOW (ref >60)
Glucose, Bld: 392 mg/dL — ABNORMAL HIGH (ref 65–99)
Potassium: 5 mmol/L (ref 3.5–5.1)
SODIUM: 133 mmol/L — AB (ref 135–145)
Total Bilirubin: 1 mg/dL (ref 0.3–1.2)
Total Protein: 6.2 g/dL — ABNORMAL LOW (ref 6.5–8.1)

## 2016-10-10 LAB — URINALYSIS, COMPLETE (UACMP) WITH MICROSCOPIC
BACTERIA UA: NONE SEEN
BILIRUBIN URINE: NEGATIVE
Glucose, UA: >=500 mg/dL — AB
Hgb urine dipstick: NEGATIVE
KETONES UR: NEGATIVE mg/dL
Nitrite: NEGATIVE
PH: 5 (ref 5.0–8.0)
PROTEIN: NEGATIVE mg/dL
Specific Gravity, Urine: 1.021 (ref 1.005–1.030)

## 2016-10-10 LAB — GLUCOSE, CAPILLARY
GLUCOSE-CAPILLARY: 196 mg/dL — AB (ref 65–99)
Glucose-Capillary: 116 mg/dL — ABNORMAL HIGH (ref 65–99)
Glucose-Capillary: 153 mg/dL — ABNORMAL HIGH (ref 65–99)
Glucose-Capillary: 176 mg/dL — ABNORMAL HIGH (ref 65–99)
Glucose-Capillary: 256 mg/dL — ABNORMAL HIGH (ref 65–99)

## 2016-10-10 LAB — HEMOGLOBIN AND HEMATOCRIT, BLOOD
HCT: 31.2 % — ABNORMAL LOW (ref 40.0–52.0)
HEMATOCRIT: 34.6 % — AB (ref 40.0–52.0)
HEMOGLOBIN: 12.2 g/dL — AB (ref 13.0–18.0)
HEMOGLOBIN: 10.8 g/dL — AB (ref 13.0–18.0)

## 2016-10-10 LAB — CBC WITH DIFFERENTIAL/PLATELET
BASOS PCT: 0 %
Basophils Absolute: 0 10*3/uL (ref 0–0.1)
EOS ABS: 0 10*3/uL (ref 0–0.7)
EOS PCT: 0 %
HCT: 35.1 % — ABNORMAL LOW (ref 40.0–52.0)
Hemoglobin: 12 g/dL — ABNORMAL LOW (ref 13.0–18.0)
Lymphocytes Relative: 11 %
Lymphs Abs: 1.6 10*3/uL (ref 1.0–3.6)
MCH: 31.6 pg (ref 26.0–34.0)
MCHC: 34.2 g/dL (ref 32.0–36.0)
MCV: 92.6 fL (ref 80.0–100.0)
Monocytes Absolute: 1.2 10*3/uL — ABNORMAL HIGH (ref 0.2–1.0)
Monocytes Relative: 8 %
Neutro Abs: 12 10*3/uL — ABNORMAL HIGH (ref 1.4–6.5)
Neutrophils Relative %: 81 %
PLATELETS: 177 10*3/uL (ref 150–440)
RBC: 3.8 MIL/uL — AB (ref 4.40–5.90)
RDW: 12.3 % (ref 11.5–14.5)
WBC: 14.8 10*3/uL — AB (ref 3.8–10.6)

## 2016-10-10 LAB — MRSA PCR SCREENING: MRSA BY PCR: NEGATIVE

## 2016-10-10 LAB — PROTIME-INR
INR: 1.38
PROTHROMBIN TIME: 17.1 s — AB (ref 11.4–15.2)

## 2016-10-10 LAB — ABO/RH: ABO/RH(D): O POS

## 2016-10-10 LAB — APTT: aPTT: 26 seconds (ref 24–36)

## 2016-10-10 LAB — PREPARE RBC (CROSSMATCH)

## 2016-10-10 LAB — ETHANOL: Alcohol, Ethyl (B): <5 mg/dL (ref <5)

## 2016-10-10 SURGERY — ESOPHAGOGASTRODUODENOSCOPY (EGD) WITH PROPOFOL
Anesthesia: General

## 2016-10-10 MED ORDER — SODIUM CHLORIDE 0.9 % IV SOLN
INTRAVENOUS | Status: DC
  Administered 2016-10-10 – 2016-10-11 (×4): via INTRAVENOUS

## 2016-10-10 MED ORDER — EPHEDRINE SULFATE 50 MG/ML IJ SOLN
INTRAMUSCULAR | Status: AC
  Filled 2016-10-10: qty 1

## 2016-10-10 MED ORDER — SODIUM CHLORIDE 0.9 % IV SOLN: INTRAVENOUS | Status: DC

## 2016-10-10 MED ORDER — PNEUMOCOCCAL VAC POLYVALENT 25 MCG/0.5ML IJ INJ
0.5000 mL | INJECTION | INTRAMUSCULAR | Status: AC
  Administered 2016-10-13: 0.5 mL via INTRAMUSCULAR
  Filled 2016-10-10: qty 0.5

## 2016-10-10 MED ORDER — LIDOCAINE HCL 2 % IJ SOLN
INTRAMUSCULAR | Status: AC
  Filled 2016-10-10: qty 10

## 2016-10-10 MED ORDER — FENTANYL CITRATE (PF) 100 MCG/2ML IJ SOLN
INTRAMUSCULAR | Status: AC
  Filled 2016-10-10: qty 2

## 2016-10-10 MED ORDER — ONDANSETRON HCL 4 MG/2ML IJ SOLN
4.0000 mg | Freq: Four times a day (QID) | INTRAMUSCULAR | Status: DC | PRN
  Administered 2016-10-10: 4 mg via INTRAVENOUS
  Filled 2016-10-10: qty 2

## 2016-10-10 MED ORDER — SODIUM CHLORIDE 0.9 % IJ SOLN
INTRAMUSCULAR | Status: AC
  Filled 2016-10-10: qty 10

## 2016-10-10 MED ORDER — POLYETHYLENE GLYCOL 3350 17 G PO PACK: 17.0000 g | PACK | Freq: Every day | ORAL | Status: DC | PRN

## 2016-10-10 MED ORDER — PHENYLEPHRINE HCL 10 MG/ML IJ SOLN
INTRAMUSCULAR | Status: DC | PRN
  Administered 2016-10-10 (×2): 200 ug via INTRAVENOUS

## 2016-10-10 MED ORDER — LIDOCAINE 5 % EX PTCH
1.0000 | MEDICATED_PATCH | CUTANEOUS | Status: DC
  Administered 2016-10-10 – 2016-10-13 (×4): 1 via TRANSDERMAL
  Filled 2016-10-10 (×6): qty 1

## 2016-10-10 MED ORDER — ACETAMINOPHEN 650 MG RE SUPP: 650.0000 mg | Freq: Four times a day (QID) | RECTAL | Status: DC | PRN

## 2016-10-10 MED ORDER — MIDAZOLAM HCL 2 MG/2ML IJ SOLN
INTRAMUSCULAR | Status: DC | PRN
  Administered 2016-10-10: 1 mg via INTRAVENOUS

## 2016-10-10 MED ORDER — PANTOPRAZOLE SODIUM 40 MG IV SOLR
40.0000 mg | Freq: Two times a day (BID) | INTRAVENOUS | Status: DC
  Administered 2016-10-13: 40 mg via INTRAVENOUS
  Filled 2016-10-10: qty 40

## 2016-10-10 MED ORDER — PHENYLEPHRINE HCL 10 MG/ML IJ SOLN
INTRAMUSCULAR | Status: AC
  Filled 2016-10-10: qty 1

## 2016-10-10 MED ORDER — HYDROCODONE-ACETAMINOPHEN 5-325 MG PO TABS
1.0000 | ORAL_TABLET | ORAL | Status: DC | PRN
  Administered 2016-10-10: 1 via ORAL
  Administered 2016-10-13 – 2016-10-14 (×2): 2 via ORAL
  Filled 2016-10-10: qty 1
  Filled 2016-10-10 (×2): qty 2

## 2016-10-10 MED ORDER — INSULIN ASPART 100 UNIT/ML ~~LOC~~ SOLN
0.0000 [IU] | SUBCUTANEOUS | Status: DC
  Administered 2016-10-10: 2 [IU] via SUBCUTANEOUS
  Administered 2016-10-10: 5 [IU] via SUBCUTANEOUS
  Administered 2016-10-12: 1 [IU] via SUBCUTANEOUS
  Filled 2016-10-10: qty 2
  Filled 2016-10-10: qty 1
  Filled 2016-10-10: qty 5

## 2016-10-10 MED ORDER — SODIUM CHLORIDE 0.9 % IV SOLN
80.0000 mg | Freq: Once | INTRAVENOUS | Status: AC
  Administered 2016-10-10: 80 mg via INTRAVENOUS
  Filled 2016-10-10: qty 80

## 2016-10-10 MED ORDER — LIDOCAINE HCL (CARDIAC) 20 MG/ML IV SOLN
INTRAVENOUS | Status: DC | PRN
  Administered 2016-10-10: 60 mg via INTRAVENOUS

## 2016-10-10 MED ORDER — ACETAMINOPHEN 325 MG PO TABS: 650.0000 mg | ORAL_TABLET | Freq: Four times a day (QID) | ORAL | Status: DC | PRN

## 2016-10-10 MED ORDER — SODIUM CHLORIDE 0.9 % IV SOLN
8.0000 mg/h | INTRAVENOUS | Status: AC
  Administered 2016-10-10 – 2016-10-13 (×7): 8 mg/h via INTRAVENOUS
  Filled 2016-10-10 (×7): qty 80

## 2016-10-10 MED ORDER — ONDANSETRON HCL 4 MG PO TABS: 4.0000 mg | ORAL_TABLET | Freq: Four times a day (QID) | ORAL | Status: DC | PRN

## 2016-10-10 MED ORDER — PROPOFOL 10 MG/ML IV BOLUS
INTRAVENOUS | Status: DC | PRN
  Administered 2016-10-10: 50 mg via INTRAVENOUS

## 2016-10-10 MED ORDER — FENTANYL CITRATE (PF) 100 MCG/2ML IJ SOLN
INTRAMUSCULAR | Status: DC | PRN
  Administered 2016-10-10: 25 ug via INTRAVENOUS

## 2016-10-10 MED ORDER — SODIUM CHLORIDE 0.9 % IV SOLN
10.0000 mL/h | Freq: Once | INTRAVENOUS | Status: AC
  Administered 2016-10-10: 10 mL/h via INTRAVENOUS

## 2016-10-10 MED ORDER — SODIUM CHLORIDE 0.9 % IV BOLUS (SEPSIS)
500.0000 mL | Freq: Once | INTRAVENOUS | Status: AC
  Administered 2016-10-10: 500 mL via INTRAVENOUS

## 2016-10-10 MED ORDER — PROPOFOL 500 MG/50ML IV EMUL
INTRAVENOUS | Status: AC
  Filled 2016-10-10: qty 50

## 2016-10-10 MED ORDER — MIDAZOLAM HCL 2 MG/2ML IJ SOLN
INTRAMUSCULAR | Status: AC
  Filled 2016-10-10: qty 2

## 2016-10-10 NOTE — Anesthesia Post-op Follow-up Note (Cosign Needed)
Anesthesia QCDR form completed.        

## 2016-10-10 NOTE — Op Note (Signed)
[facility] Patient Name: Lance [name] Procedure Date: 10/10/2016 3:51 PM MRN: [id number] Account #: [id number] Date of Birth: [birth year] Admit Type: Inpatient Age: 71 Room: [facility] ENDO ROOM 1 Gender: Male Note Status: Finalized Procedure:            Upper GI endoscopy Indications:          Melena Providers:            Lucilla Lame MD, MD Referring MD:         Tania Ade (Referring MD) Medicines:            Propofol per Anesthesia Complications:        No immediate complications. Procedure:            Pre-Anesthesia Assessment:                       - Prior to the procedure, a History and Physical was                        performed, and patient medications and allergies were                        reviewed. The patient's tolerance of previous                        anesthesia was also reviewed. The risks and benefits of                        the procedure and the sedation options and risks were                        discussed with the patient. All questions were                        answered, and informed consent was obtained. Prior                        Anticoagulants: The patient has taken no previous                        anticoagulant or antiplatelet agents. ASA Grade                        Assessment: II - A patient with mild systemic disease.                        After reviewing the risks and benefits, the patient was                        deemed in satisfactory condition to undergo the                        procedure.                       After obtaining informed consent, the endoscope was                        passed under direct vision. Throughout the procedure,  the patient's blood pressure, pulse, and oxygen                        saturations were monitored continuously. The Endoscope                        was introduced through the mouth, and advanced to the                        second part  of duodenum. The upper GI endoscopy was                        accomplished without difficulty. The patient tolerated                        the procedure well. Findings:      The examined esophagus was normal.      Moderate inflammation characterized by aphthous ulcerations was found in       the gastric body and in the gastric antrum.      One non-bleeding cratered duodenal ulcer with a visible vessel was found       in the duodenal bulb. Impression:           - Normal esophagus.                       - Gastritis.                       - One non-bleeding duodenal ulcer with a visible vessel.                       - No specimens collected. Recommendation:       - Return patient to ICU for ongoing care. Lucilla Lame MD, MD 10/10/2016 4:38:50 PM This report has been signed electronically. Number of Addenda: 0 Note Initiated On: 10/10/2016 3:51 PM      [facility]

## 2016-10-10 NOTE — Consult Note (Addendum)
[facility] Vascular Consult Note  MRN : [id number]  Lance [name] is a 71 y.o. (20-Jun-1945) male who presents with chief complaint of  Chief Complaint  Patient presents with  . GI Bleeding  . Emesis  .  History of Present Illness: I am asked to see the patient by Dr. Allen Norris For consideration for embolization for an upper GI bleed. The patient was admitted earlier today with recurrent brisk lower GI bleeding. At this point was a combination of melena and bright red blood per rectum. He was previously admitted for melena several days ago. He has a gnawing upper abdominal pain. He denies use of NSAIDs or aspirin. He does not take Alka-Seltzer or Goody powders. He has been throwing up blood. He has no fever or chills. He underwent upper endoscopy which I have reviewed the images with his gastroenterologist and has a very large duodenal ulcer with a clearly exposed visible vessel. This is not actively bleeding, but clearly had recent bleeding. This is located in the first portion of the duodenum.  Current Facility-Administered Medications  Medication Dose Route Frequency Provider Last Rate Last Dose  . 0.9 %  sodium chloride infusion   Intravenous Continuous Hillary Bow, MD 100 mL/hr at 10/10/16 1041    . 0.9 %  sodium chloride infusion   Intravenous Continuous Algernon Huxley, MD      . Doug Sou Hold] acetaminophen (TYLENOL) tablet 650 mg  650 mg Oral Q6H PRN Hillary Bow, MD       Or  . Doug Sou Hold] acetaminophen (TYLENOL) suppository 650 mg  650 mg Rectal Q6H PRN Hillary Bow, MD      . Doug Sou Hold] HYDROcodone-acetaminophen (NORCO/VICODIN) 5-325 MG per tablet 1-2 tablet  1-2 tablet Oral Q4H PRN Hillary Bow, MD      . Doug Sou Hold] insulin aspart (novoLOG) injection 0-9 Units  0-9 Units Subcutaneous Q4H Hillary Bow, MD   5 Units at 10/10/16 1126  . [MAR Hold] ondansetron (ZOFRAN) tablet 4 mg  4 mg Oral Q6H PRN Hillary Bow, MD       Or  . Doug Sou Hold] ondansetron (ZOFRAN)  injection 4 mg  4 mg Intravenous Q6H PRN Hillary Bow, MD   4 mg at 10/10/16 1316  . pantoprazole (PROTONIX) 80 mg in sodium chloride 0.9 % 250 mL (0.32 mg/mL) infusion  8 mg/hr Intravenous Continuous Schuyler Amor, MD 25 mL/hr at 10/10/16 0944 8 mg/hr at 10/10/16 0944  . [MAR Hold] pantoprazole (PROTONIX) injection 40 mg  40 mg Intravenous Q12H Schuyler Amor, MD      . Doug Sou Hold] pneumococcal 23 valent vaccine (PNU-IMMUNE) injection 0.5 mL  0.5 mL Intramuscular Tomorrow-1000 Wilhelmina Mcardle, MD      . Doug Sou Hold] polyethylene glycol (MIRALAX / GLYCOLAX) packet 17 g  17 g Oral Daily PRN Hillary Bow, MD        Past Medical History:  Diagnosis Date  . Diabetes mellitus without complication ([place])   . Hepatitis C   . Schizophrenia ([place])     History reviewed. No pertinent surgical history.  Social History Social History  Substance Use Topics  . Smoking status: Current Every Day Smoker  . Smokeless tobacco: Never Used  . Alcohol use 6.0 oz/week    10 Shots of liquor per week  No IV drug use  Family History Family History  Problem Relation Age of Onset  . Diabetes Sister   No bleeding disorders, clotting disorders, autoimmune diseases, or aneurysms  No  Known Allergies   REVIEW OF SYSTEMS (Negative unless checked)  Constitutional: [] Weight loss  [] Fever  [] Chills Cardiac: [] Chest pain   [] Chest pressure   [x] Palpitations   [] Shortness of breath when laying flat   [] Shortness of breath at rest   [] Shortness of breath with exertion. Vascular:  [] Pain in legs with walking   [] Pain in legs at rest   [] Pain in legs when laying flat   [] Claudication   [] Pain in feet when walking  [] Pain in feet at rest  [] Pain in feet when laying flat   [] History of DVT   [] Phlebitis   [] Swelling in legs   [] Varicose veins   [] Non-healing ulcers Pulmonary:   [] Uses home oxygen   [] Productive cough   [] Hemoptysis   [] Wheeze  [] COPD   [] Asthma Neurologic:  [] Dizziness  [] Blackouts   [] Seizures    [] History of stroke   [] History of TIA  [] Aphasia   [] Temporary blindness   [] Dysphagia   [] Weakness or numbness in arms   [] Weakness or numbness in legs Musculoskeletal:  [] Arthritis   [] Joint swelling   [] Joint pain   [] Low back pain Hematologic:  [] Easy bruising  [] Easy bleeding   [] Hypercoagulable state   [] Anemic  [] Hepatitis Gastrointestinal:  [x] Blood in stool   [x] Vomiting blood  [x] Gastroesophageal reflux/heartburn   [] Difficulty swallowing. Genitourinary:  [] Chronic kidney disease   [] Difficult urination  [] Frequent urination  [] Burning with urination   [] Blood in urine Skin:  [] Rashes   [] Ulcers   [] Wounds Psychological:  [x] History of anxiety   []  History of major depression.  Physical Examination  Vitals:   10/10/16 1630 10/10/16 1640 10/10/16 1650 10/10/16 1700  BP: 98/62 95/66 107/77 122/76  Pulse: 83 85 92 92  Resp: 17 16 (!) 22 16  Temp: 97.1 F (36.2 C)     TempSrc: Tympanic     SpO2: 100% 100% 100% 100%  Weight:      Height:       Body mass index is 22.63 kg/m. Gen:  WD/WN, NAD Head: [place]/AT, No temporalis wasting.  Ear/Nose/Throat: Hearing grossly intact, nares w/o erythema or drainage, oropharynx w/o Erythema/Exudate Eyes: Sclera non-icteric, conjunctiva clear Neck: Trachea midline.  No JVD.  Pulmonary:  Good air movement, respirations not labored, equal bilaterally.  Cardiac: RRR, normal S1, S2. Vascular:  Vessel Right Left  Radial Palpable Palpable                                   Gastrointestinal: soft, non-tender/non-distended.  Musculoskeletal: M/S 5/5 throughout.  Extremities without ischemic changes.  No deformity or atrophy. No edema. Neurologic: Sensation grossly intact in extremities.  Symmetrical.  Speech is fluent. Motor exam as listed above. Psychiatric: Judgment intact, Mood & affect appropriate for pt's clinical situation. Dermatologic: No rashes or ulcers noted.  No cellulitis or open wounds.    HGB 12.2 (L) 10/10/2016   HCT 34.6  (L) 10/10/2016   MCV 92.6 10/10/2016   PLT 177 10/10/2016    BMET    Component Value Date/Time   NA 133 (L) 10/10/2016 0822   K 5.0 10/10/2016 0822   CL 97 (L) 10/10/2016 0822   CO2 23 10/10/2016 0822   GLUCOSE 392 (H) 10/10/2016 0822   BUN 61 (H) 10/10/2016 0822   CREATININE 1.57 (H) 10/10/2016 0822   CALCIUM 8.1 (L) 10/10/2016 0822   GFRNONAA 44 (L) 10/10/2016 0822   GFRAA 51 (L) 10/10/2016 8756  Estimated Creatinine Clearance: 44.3 mL/min (A) (by C-G formula based on SCr of 1.57 mg/dL (H)).  COAG Lab Results  Component Value Date   INR 1.38 10/10/2016    Radiology Ct Abdomen Pelvis W Contrast  Result Date: 10/06/2016 CLINICAL DATA:  Abdominal pain and vomiting. EXAM: CT ABDOMEN AND PELVIS WITH CONTRAST TECHNIQUE: Multidetector CT imaging of the abdomen and pelvis was performed using the standard protocol following bolus administration of intravenous contrast. CONTRAST:  169mL ISOVUE-300 IOPAMIDOL (ISOVUE-300) INJECTION 61% COMPARISON:  None. FINDINGS: Lower chest: No acute abnormality. Hepatobiliary: No focal liver abnormality is seen. No gallbladder wall thickening, or biliary dilatation. Biphasic appearance of the gallbladder suggestive of sludge. Pancreas: Unremarkable. No pancreatic ductal dilatation or surrounding inflammatory changes. Spleen: Normal in size without focal abnormality. Adrenals/Urinary Tract: Adrenal glands are unremarkable. Kidneys are without renal calculi, focal lesion, or hydronephrosis. Bilateral renal cysts. Bladder is unremarkable. Stomach/Bowel: Stomach is within normal limits. No evidence of appendicitis. No evidence of bowel wall thickening, distention, or inflammatory changes. Vascular/Lymphatic: Aortic atherosclerosis. No enlarged abdominal or pelvic lymph nodes. Reproductive: Heterogeneous appears of the prostate gland. Other: No abdominal wall hernia or abnormality. No abdominopelvic ascites. Musculoskeletal: L3-L4 and L4-L5 osteoarthritic changes  with particularly prominent discogenic endplate remodeling in the inferior endplate of L4 vertebral body. Extensive lower lumbosacral spine posterior facet arthropathy. IMPRESSION: Suspected gallbladder sludge. Bilateral renal cysts. Aortic atherosclerosis. Heterogeneous appearance of the prostate gland. Please correlate to patient's serum PSA values. Osteoarthritic changes at L4-L5 and L3-L4, and advanced posterior facet arthropathy of the lumbosacral spine. Electronically Signed   By: Fidela Salisbury M.D.   On: 10/06/2016 16:19   US Abdomen Limited Ruq  Result Date: 10/06/2016 CLINICAL DATA:  Right upper quadrant pain. Elevated liver function tests. EXAM: US ABDOMEN LIMITED - RIGHT UPPER QUADRANT COMPARISON:  CT, 10/06/2016 at 1603 hours FINDINGS: Gallbladder: No gallstones or wall thickening visualized. No sonographic Murphy sign noted by sonographer. Common bile duct: Diameter: 4 mm Liver: No focal lesion identified. Within normal limits in parenchymal echogenicity. IMPRESSION: Normal right upper quadrant ultrasound. Electronically Signed   By: Lajean Manes M.D.   On: 10/06/2016 17:04      Assessment/Plan 1. upper GI bleeding with an exposed vessel and the first portion of the duodenum. I have had a long discussion with gastroenterology and general surgery as well as the patient regarding the vascular options for treatment of this patient. I have discussed with the patient in detail the options for mesenteric artery embolization. I think embolization of the gastroduodenal artery would be a reasonable option as that is the most likely vessel to be bleeding. If that is the culprit vessel, the likelihood of success is reasonably high. Not be particularly able to selectively embolize smaller branches or other bleeding branches. The fact that he is not actively bleeding may make it more difficult to localize, but I think a planned coil embolization of the gastroduodenal artery tomorrow would be a  reasonable option. If that fails, open surgical therapy would likely be required. The patient voices understanding and agrees to proceed with this.  2. Tobacco dependence. May increase his risk of ulcer disease and certainly increase his risk of vascular disease.  3. Diabetes. Stable on outpatient medications and blood glucose control important in reducing the progression of atherosclerotic disease. Also, involved in wound healing. On appropriate medications. 4. Hepatitis C. This does not appear to be a portal hypertension situation is more a duodenal ulcer bleed.   Leotis Pain, MD  10/10/2016 5:14 PM    This note was created with Dragon medical transcription system.  Any error is purely unintentional

## 2016-10-10 NOTE — Consult Note (Signed)
This patient was admitted with melanotic stools and a drop in his hemoglobin.  The patient was taken down for a upper endoscopy shortly after admission.  The patient was found to have a large duodenal ulcer with visible vessels seen.  The patient has been seen by surgery and vascular surgery in case the patient has further bleeding.

## 2016-10-10 NOTE — ED Provider Notes (Addendum)
[facility] Emergency Department Provider Note  ____________________________________________   I have reviewed the triage vital signs and the nursing notes.   HISTORY  Chief Complaint GI Bleeding and Emesis    HPI Lance [name] is a 71 y.o. male with a history of diabetes, who states he did have some sort of bleeding once from his bottom years ago but he cannot really remember the details. He states that he was seen here 4 days ago at that time he had reassuring ultrasound and CT he will was over 16, he went home his abdominal pain which was epigastric is primarily gone. He is now complaining of hematemesis and melanotic stools since yesterday. EMS found him with a marginal blood pressure  they did give him IV fluids and his pressure responded well. He has had 300 cc approximately prior to arrival. Patient states his pain is minimal. Nothing makes symptoms better nothing makes symptoms worse, symptoms since yesterday, pain is better, nothing made the pain better and nothing made the pain worse and seemed to get better on its own. Pain was in the epigastric region and sharp.   Past Medical History:  Diagnosis Date  . Diabetes mellitus without complication ([place])     There are no active problems to display for this patient.   No past surgical history on file.  Prior to Admission medications   Medication Sig Start Date End Date Taking? Authorizing Provider  dicyclomine (BENTYL) 20 MG tablet Take 1 tablet (20 mg total) by mouth 3 (three) times daily as needed (abdominal pain). 03/16/16   Nance Pear, MD  gabapentin (NEURONTIN) 300 MG capsule Take 300 mg by mouth 3 (three) times daily. 03/04/16   Historical Provider, MD  HYDROcodone-acetaminophen (NORCO/VICODIN) 5-325 MG tablet Take 1 tablet by mouth every 4 (four) hours as needed. 10/06/16   Harvest Dark, MD  JANUVIA 100 MG tablet Take 100 mg by mouth daily. 03/06/16   Historical Provider, MD  metFORMIN  (GLUCOPHAGE-XR) 500 MG 24 hr tablet Take 500 mg by mouth daily. 02/04/16   Historical Provider, MD  Multiple Vitamins-Minerals (GNP MENS MULTIPLUS PO) Take 1 tablet by mouth daily.    Historical Provider, MD  ondansetron (ZOFRAN) 4 MG tablet Take 1 tablet (4 mg total) by mouth every 8 (eight) hours as needed for nausea or vomiting. 10/06/16   Lisa Roca, MD  oxyCODONE-acetaminophen (ROXICET) 5-325 MG tablet Take 1 tablet by mouth every 6 (six) hours as needed for severe pain. 03/14/16   Schuyler Amor, MD    Allergies Patient has no known allergies.  No family history on file.  Social History Social History  Substance Use Topics  . Smoking status: Current Every Day Smoker  . Smokeless tobacco: Never Used  . Alcohol use 6.0 oz/week    10 Shots of liquor per week    Review of Systems Constitutional: No fever/chills Eyes: No visual changes. ENT: No sore throat. No stiff neck no neck pain Cardiovascular: Denies chest pain. Respiratory: Denies shortness of breath. Gastrointestinal:  See history of present illness  No constipation. Genitourinary: Negative for dysuria. Musculoskeletal: Negative lower extremity swelling Skin: Negative for rash. Neurological: Negative for severe headaches, focal weakness or numbness. 10-point ROS otherwise negative.  ____________________________________________   PHYSICAL EXAM:  VITAL SIGNS: ED Triage Vitals  Enc Vitals Group     BP 10/10/16 0824 104/74     Pulse Rate 10/10/16 0824 100     Resp 10/10/16 0824 (!) 21  Temp 10/10/16 0824 97.6 F (36.4 C)     Temp Source 10/10/16 0824 Oral     SpO2 10/10/16 0824 98 %     Weight 10/10/16 0821 166 lb (75.3 kg)     Height 10/10/16 0821 5\' 9"  (1.753 m)     Head Circumference --      Peak Flow --      Pain Score 10/10/16 0818 10     Pain Loc --      Pain Edu? --      Excl. in [place]? --     Constitutional: Alert and oriented. Well appearing and in no acute distress. Eyes: Conjunctivae are  normal. PERRL. EOMI. Head: Atraumatic. Nose: No congestion/rhinnorhea. Mouth/Throat: Mucous membranes are moist.  Oropharynx non-erythematous. Neck: No stridor.   Nontender with no meningismus Cardiovascular: Normal rate, regular rhythm. Grossly normal heart sounds.  Good peripheral circulation. Respiratory: Normal respiratory effort.  No retractions. Lungs CTAB. Abdominal: Soft and nontender. No distention. No guarding no rebound Back:  There is no focal tenderness or step off.  there is no midline tenderness there are no lesions noted. there is no CVA tenderness Rectal exam: Guaiac positive melanotic reddish stool Musculoskeletal: No lower extremity tenderness, no upper extremity tenderness. No joint effusions, no DVT signs strong distal pulses no edema Neurologic:  Normal speech and language. No gross focal neurologic deficits are appreciated.  Skin:  Skin is warm, dry and intact. No rash noted. Psychiatric: Mood and affect are normal. Speech and behavior are normal.  ____________________________________________   LABS (all labs ordered are listed, but only abnormal results are displayed)  Labs Reviewed  COMPREHENSIVE METABOLIC PANEL  ETHANOL  CBC WITH DIFFERENTIAL/PLATELET  PROTIME-INR  URINALYSIS, COMPLETE (UACMP) WITH MICROSCOPIC  APTT  TYPE AND SCREEN  PREPARE RBC (CROSSMATCH)   ____________________________________________  EKG  I personally interpreted any EKGs ordered by me or triage Sinus tach rate 104, normal axis no acute ST elevation or depression nonspecific ST changes noted ____________________________________________  RADIOLOGY  I reviewed any imaging ordered by me or triage that were performed during my shift and, if possible, patient and/or family made aware of any abnormal findings. ____________________________________________   PROCEDURES  Procedure(s) performed: None  Procedures  Critical Care performed: CRITICAL CARE Performed by: Schuyler Amor   Total critical care time: 60 minutes  Critical care time was exclusive of separately billable procedures and treating other patients.  Critical care was necessary to treat or prevent imminent or life-threatening deterioration.  Critical care was time spent personally by me on the following activities: development of treatment plan with patient and/or surrogate as well as nursing, discussions with consultants, evaluation of patient's response to treatment, examination of patient, obtaining history from patient or surrogate, ordering and performing treatments and interventions, ordering and review of laboratory studies, ordering and review of radiographic studies, pulse oximetry and re-evaluation of patient's condition.   ____________________________________________   INITIAL IMPRESSION / ASSESSMENT AND PLAN / ED COURSE  Pertinent labs & imaging results that were available during my care of the patient were reviewed by me and considered in my medical decision making (see chart for details).  Patient here with a likely upper GI bleed with hematemesis and melena. Giving him IV proton X, type and screen, why we'll transfuse him empirically given low blood pressure in the field, at this point his blood pressures were lower than his baseline in any event. He does appear to be stable enough hopefully to wait for type and  cross if not will use emergency release blood. Patient will need to be admitted to the hospital for endoscopy and colonoscopy. Nothing to suggest rupture, patient has normal abdominal exam with no tenderness. Nothing to suggest ruptured viscus or hemoperitoneum.  ----------------------------------------- 9:01 AM on 10/10/2016 -----------------------------------------  Discussed with GI, they do recommend unit admission, we'll discuss with the hospitalist service, have paged the hospitalist service. Hemoglobin is 12 but obviously that stem 4 from yesterday blood pressure  remains tenuous, he is around 90 right now, we will give him IV fluids pending blood. If we have to we'll give him not crossmatch blood. I talked to the blood bank, they state were about 20 minutes away from being able to give him crossmatch blood. It is my preference to do that but if he is unstable in any worsening way we'll give him not crossmatch blood. Patient is awake and alert. Risk benefits and alternatives to transfusion have been explained, patient understands and agrees to transfusion. He has no religious or other barriers to transfusion        ____________________________________________   FINAL CLINICAL IMPRESSION(S) / ED DIAGNOSES  Final diagnoses:  None      This chart was dictated using voice recognition software.  Despite best efforts to proofread,  errors can occur which can change meaning.      Schuyler Amor, MD 10/10/16 1281    Schuyler Amor, MD 10/10/16 [place], MD 10/10/16 [place], MD 10/10/16 [phone]

## 2016-10-10 NOTE — Consult Note (Signed)
Name: Lance [name] MRN: [id number] DOB: [birth year]    ADMISSION DATE:  10/10/2016 CONSULTATION DATE: 10/10/2016  REFERRING MD :  Dr. Darvin Neighbours   CHIEF COMPLAINT:  Vomiting black emesis and Black tarry stools   BRIEF PATIENT DESCRIPTION:  71 yo male admitted 05/3 with gastrointestinal bleeding and hypotension  SIGNIFICANT EVENTS  05/3-Pt admitted to ICU   STUDIES:  None  HISTORY OF PRESENT ILLNESS:   This is a 71 yo male with a PMH of Schizophrenia, Depression, Chronic Hepatitis C, Gastrointestinal Bleeding, Former IV Drug User, Current everyday smoker (1-4 cigarettes per day), and Type 2 Diabetes Mellitus.  He presented to [facility] ER 05/3 with c/o vomiting black emesis and having black tarry stools  onset of symptoms 05/2.  He recently went to the ER  04/29 with c/o epigastric pain, however he was not admitted, abdominal ultrasound and CT Abd Pelvis findings were reassuring.  Per ER notes pt notified EMS today and upon EMS arrival his blood pressure was marginal, therefore he received 300 ml IV fluids with improvement of bp.  He states he had 2 syncopal episodes at home.  He denies taking anticoagulants, NSAID's, or herbal remedies.  Lab results revealed Na+ 133, Creatinine 1.57, WBC 14.8, hgb 12.0 and he was hypotensive with systolic bp 44'I.  He was subsequently admitted to [facility] for further workup and management gastroenterology consulted.    PAST MEDICAL HISTORY :   has a past medical history of Diabetes mellitus without complication ([place]); Hepatitis C; and Schizophrenia ([place]).  has no past surgical history on file. Prior to Admission medications   Medication Sig Start Date End Date Taking? Authorizing Provider  gabapentin (NEURONTIN) 300 MG capsule Take 600 mg by mouth 4 (four) times daily.  03/04/16  Yes Historical Provider, MD  JANUVIA 100 MG tablet Take 100 mg by mouth daily. 03/06/16  Yes Historical Provider, MD  metFORMIN (GLUCOPHAGE-XR) 500 MG 24 hr tablet Take 1,000 mg by  mouth 2 (two) times daily.  02/04/16  Yes Historical Provider, MD  Multiple Vitamins-Minerals (GNP MENS MULTIPLUS PO) Take 1 tablet by mouth daily.   Yes Historical Provider, MD  dicyclomine (BENTYL) 20 MG tablet Take 1 tablet (20 mg total) by mouth 3 (three) times daily as needed (abdominal pain). Patient not taking: Reported on 10/10/2016 03/16/16   Nance Pear, MD  HYDROcodone-acetaminophen (NORCO/VICODIN) 5-325 MG tablet Take 1 tablet by mouth every 4 (four) hours as needed. Patient not taking: Reported on 10/10/2016 10/06/16   Harvest Dark, MD  ondansetron (ZOFRAN) 4 MG tablet Take 1 tablet (4 mg total) by mouth every 8 (eight) hours as needed for nausea or vomiting. Patient not taking: Reported on 10/10/2016 10/06/16   Lisa Roca, MD  oxyCODONE-acetaminophen (ROXICET) 5-325 MG tablet Take 1 tablet by mouth every 6 (six) hours as needed for severe pain. Patient not taking: Reported on 10/10/2016 03/14/16   Schuyler Amor, MD   No Known Allergies  FAMILY HISTORY:  family history includes Diabetes in his sister. SOCIAL HISTORY:  reports that he has been smoking.  He has never used smokeless tobacco. He reports that he drinks about 6.0 oz of alcohol per week . He reports that he does not use drugs.  REVIEW OF SYSTEMS:  Positives in BOLD  Constitutional: Negative for fever, chills, weight loss, malaise/fatigue and diaphoresis.  HENT: Negative for hearing loss, ear pain, nosebleeds, congestion, sore throat, neck pain, tinnitus and ear discharge.  Eyes: Negative for blurred vision, double vision, photophobia, pain,  discharge and redness.  Respiratory: Negative for cough, hemoptysis, sputum production, shortness of breath, wheezing and stridor.   Cardiovascular: Negative for chest pain, palpitations, orthopnea, claudication, leg swelling and PND.  Gastrointestinal: heartburn, nausea, hematemesis, epigastric pain, diarrhea, constipation, blood in stool and melena.  Genitourinary: Negative for  dysuria, urgency, frequency, hematuria and flank pain.  Musculoskeletal: Negative for myalgias, back pain, joint pain and falls.  Skin: Negative for itching and rash.  Neurological: Negative for dizziness, tingling, tremors, sensory change, speech change, focal weakness, seizures, loss of consciousness, weakness and headaches.  Endo/Heme/Allergies: Negative for environmental allergies and polydipsia. Does not bruise/bleed easily.  SUBJECTIVE:  No complaints at this time he states he wants to eat.  VITAL SIGNS: Temp:  [97.6 F (36.4 C)] 97.6 F (36.4 C) (05/03 0824) Pulse Rate:  [95-100] 95 (05/03 0830) Resp:  [17-23] 22 (05/03 1030) BP: (86-122)/(69-84) 106/79 (05/03 1030) SpO2:  [98 %-99 %] 99 % (05/03 0830) Weight:  [75.3 kg (166 lb)] 75.3 kg (166 lb) (05/03 0821)  PHYSICAL EXAMINATION: General: AA male resting in bed, NAD Neuro: alert and oriented, follows commands  HEENT: supple, no JVD Cardiovascular: nsr, s1s2, no M/R/G Lungs: rhonchi throughout, even, non labored  Abdomen: +BS x4, soft, non tender, non distended  Musculoskeletal: normal bulk and tone, no edema Skin: intact no rashes or lesions    Recent Labs Lab 10/06/16 1117 10/10/16 0822  NA 136 133*  K 4.0 5.0  CL 96* 97*  CO2 30 23  BUN 16 61*  CREATININE 0.86 1.57*  GLUCOSE 206* 392*    Recent Labs Lab 10/06/16 1117 10/10/16 0822  HGB 16.6 12.0*  HCT 49.0 35.1*  WBC 9.7 14.8*  PLT 176 177   No results found.  ASSESSMENT / PLAN: Gastrointestinal Bleeding  Hypotension secondary to hypovolemia due to GI bleed Anemia with acute blood loss Acute renal failure likely secondary to hypotension Mild leukocytosis  Hx: Uncontrolled Type 2 DM and Current Everyday smoker  P: Supplemental O2 to maintain O2 sats >92% Serial h&h Monitor for s/sx of bleeding  Gastroenterology consulted appreciate input will likely need colonoscopy and endoscopy Protonix gtt Transfuse 1 unit pRBC's today 05/3 recheck CBC  1hr post transfusion  Will avoid chemical VTE prophylaxis, SCD's for VTE prophylaxis  Fluid resuscitation as needed to maintain map >65 NS @ 100 ml/hr Trend BMP Replace electrolytes as indicated Monitor UOP Continuous telemetry monitoring Keep NPO for now  CBG's q4hr and SSI   Smoking Cessation counseling provided   Marda Stalker, [place] Pager [phone] (please enter 7 digits) PCCM Consult Pager [phone] (please enter 7 digits)   Pt seen and examined with NP, agree with assessment, plan, findings. The patient is a 71 yo male with history of HCV, DM, now with vomiting, hematemesis, c/w likely upper GI bleeding. The patient was hypotensive, his BP has responded to IV fluid, he is now on protonix drip. GI consulted, will continue to monitor hb and transfuse as needed.   Marda Stalker, M.D.  10/10/2016

## 2016-10-10 NOTE — Progress Notes (Signed)
CH responded to an OR for an AD. Pt was educated on the AD. [place] provided the ministry of active listening as the Pt spoke of his wife who passed a year ago June. Pt also spoke of his desire to be a Psychologist, sport and exercise", not wanting to just talk about his faith, but to live it out. [place] and Pt prayed together for his continued spiritual growth. Pt indicated that his spirit was lifted. CH is available for follow up as needed.    10/10/16 1500  Clinical Encounter Type  Visited With Patient;Health care provider  Visit Type Initial;Spiritual support;Critical Care  Referral From Nurse  Consult/Referral To Chaplain  Spiritual Encounters  Spiritual Needs Literature;Prayer;Emotional

## 2016-10-10 NOTE — Progress Notes (Signed)
Endo Recovery finished. Report called to [facility] RN and pt. Transferred back to CCU. Pt. Aware that he is NPO

## 2016-10-10 NOTE — ED Triage Notes (Signed)
Pt arrived via EMS from home for reports of continued black tarry stools. Pt reports now vomiting black emesis. Pt was seen and discharged two days ago. Pt alert and oriented on arrival.

## 2016-10-10 NOTE — Progress Notes (Signed)
Patient went to endoscopy by bed with Ivin Booty, RN.  Report given to Webb Silversmith, RN endo nurse via phone prior to patient leaving ICU.

## 2016-10-10 NOTE — Transfer of Care (Signed)
Immediate Anesthesia Transfer of Care Note  Patient: Lance [name]  Procedure(s) Performed: Procedure(s): ESOPHAGOGASTRODUODENOSCOPY (EGD) WITH PROPOFOL (N/A)  Patient Location: Endoscopy Unit  Anesthesia Type:General  Level of Consciousness: patient cooperative and responds to stimulation  Airway & Oxygen Therapy: Patient Spontanous Breathing and Patient connected to nasal cannula oxygen  Post-op Assessment: Report given to RN, Post -op Vital signs reviewed and stable and Patient moving all extremities X 4  Post vital signs: Reviewed and stable  Last Vitals:  Vitals:   10/10/16 1530 10/10/16 1630  BP: 117/74 98/62  Pulse: 93 83  Resp: 16 17  Temp: 36.3 C 36.2 C    Last Pain:  Vitals:   10/10/16 1630  TempSrc: Tympanic  PainSc:          Complications: No apparent anesthesia complications

## 2016-10-10 NOTE — Anesthesia Preprocedure Evaluation (Signed)
Anesthesia Evaluation  Patient identified by MRN, date of birth, ID band Patient awake    Reviewed: Allergy & Precautions, NPO status , Patient's Chart, lab work & pertinent test results  History of Anesthesia Complications Negative for: history of anesthetic complications  Airway Mallampati: II  TM Distance: >3 FB Neck ROM: Full    Dental  (+) Upper Dentures, Lower Dentures   Pulmonary neg sleep apnea, neg COPD, Current Smoker,    breath sounds clear to auscultation- rhonchi (-) wheezing      Cardiovascular (-) hypertension(-) CAD and (-) Past MI  Rhythm:Regular Rate:Normal - Systolic murmurs and - Diastolic murmurs    Neuro/Psych PSYCHIATRIC DISORDERS Schizophrenia negative neurological ROS     GI/Hepatic (+) Hepatitis -, CPossible GIB   Endo/Other  diabetes, Insulin Dependent  Renal/GU negative Renal ROS     Musculoskeletal negative musculoskeletal ROS (+)   Abdominal (+) - obese,   Peds  Hematology negative hematology ROS (+)   Anesthesia Other Findings Past Medical History: No date: Diabetes mellitus without complication (H[facility]) No date: Hepatitis C No date: Schizophrenia ([place])   Reproductive/Obstetrics                             Anesthesia Physical Anesthesia Plan  ASA: III  Anesthesia Plan: General   Post-op Pain Management:    Induction: Intravenous  Airway Management Planned: Natural Airway  Additional Equipment:   Intra-op Plan:   Post-operative Plan:   Informed Consent: I have reviewed the patients History and Physical, chart, labs and discussed the procedure including the risks, benefits and alternatives for the proposed anesthesia with the patient or authorized representative who has indicated his/her understanding and acceptance.   Dental advisory given  Plan Discussed with: CRNA and Anesthesiologist  Anesthesia Plan Comments:         Lab Results   Component Value Date   WBC 14.8 (H) 10/10/2016   HGB 12.2 (L) 10/10/2016   HCT 34.6 (L) 10/10/2016   MCV 92.6 10/10/2016   PLT 177 10/10/2016    Anesthesia Quick Evaluation

## 2016-10-10 NOTE — Care Management (Signed)
RNCM consult for discharge planning. Patient is NOT open to [facility] but has an application in place [phone] which I confirmed. RNCM spoke with patient and he said he lives in a boarding house requesting more options of places to live.  Address: [place]. His contact is [phone]. I have updated CSW. The address listed on his face sheet is his daughter's address. He states this is not an option for him but he gets his mail at this address.He states he does not have any DME. Per RN patient can walk but has history of fall. Patient states he would benefit from a rollator. PT evaluation to check safety with patient using a rollator would be helpful. His  PCP is Dr. Gwynneth Aliment and he uses that pharmacy for some med and  [facility] [address] for other medications. Transports via [facility]. He would like home health physical therapy without preference of agencies provided. [facility]. RNCM will continue to follow.

## 2016-10-10 NOTE — Anesthesia Postprocedure Evaluation (Signed)
Anesthesia Post Note  Patient: Lance [name]  Procedure(s) Performed: Procedure(s) (LRB): ESOPHAGOGASTRODUODENOSCOPY (EGD) WITH PROPOFOL (N/A)  Patient location during evaluation: Endoscopy Anesthesia Type: General Level of consciousness: awake and alert and oriented Pain management: pain level controlled Vital Signs Assessment: post-procedure vital signs reviewed and stable Respiratory status: spontaneous breathing, nonlabored ventilation and respiratory function stable Cardiovascular status: blood pressure returned to baseline and stable Postop Assessment: no signs of nausea or vomiting Anesthetic complications: no     Last Vitals:  Vitals:   10/10/16 1650 10/10/16 1700  BP: 107/77 122/76  Pulse: 92 92  Resp: (!) 22 16  Temp:      Last Pain:  Vitals:   10/10/16 1630  TempSrc: Tympanic  PainSc:                  Amada Hallisey

## 2016-10-10 NOTE — H&P (Signed)
[place] [facility] [place] NAME: Lance [name]    MR#:  [id number]  DATE OF BIRTH:  Feb 10, 1946  DATE OF ADMISSION:  10/10/2016  PRIMARY CARE PHYSICIAN: Sabino Snipes KEY, MD   REQUESTING/REFERRING PHYSICIAN: Dr. Burlene Arnt  CHIEF COMPLAINT:   Chief Complaint  Patient presents with  . GI Bleeding  . Emesis    HISTORY OF PRESENT ILLNESS:  Lance [name]  is a 71 y.o. male with a known history of DM, Hepatitis C without cirrhosis presents with abdominal pain, vomiting, hematemesis, hematochezia. Was in ED on Thursday and discharged home. Later started vomiting blood. No anti coagulants. Feels weak, lightheaded. Hypotensive into systolic 40J.  PAST MEDICAL HISTORY:   Past Medical History:  Diagnosis Date  . Diabetes mellitus without complication ([place])   . Hepatitis C   . Schizophrenia ([place])     PAST SURGICAL HISTORY:  No past surgical history on file.  SOCIAL HISTORY:   Social History  Substance Use Topics  . Smoking status: Current Every Day Smoker  . Smokeless tobacco: Never Used  . Alcohol use 6.0 oz/week    10 Shots of liquor per week    FAMILY HISTORY:   Family History  Problem Relation Age of Onset  . Diabetes Sister     DRUG ALLERGIES:  No Known Allergies  REVIEW OF SYSTEMS:   Review of Systems  Constitutional: Positive for malaise/fatigue. Negative for chills and fever.  HENT: Negative for sore throat.   Eyes: Negative for blurred vision, double vision and pain.  Respiratory: Negative for cough, hemoptysis, shortness of breath and wheezing.   Cardiovascular: Negative for chest pain, palpitations, orthopnea and leg swelling.  Gastrointestinal: Positive for abdominal pain, blood in stool, melena, nausea and vomiting. Negative for constipation, diarrhea and heartburn.  Genitourinary: Negative for dysuria and hematuria.  Musculoskeletal: Negative for back pain and joint pain.  Skin: Negative for rash.   Neurological: Positive for weakness. Negative for sensory change, speech change, focal weakness and headaches.  Endo/Heme/Allergies: Does not bruise/bleed easily.  Psychiatric/Behavioral: Negative for depression. The patient is not nervous/anxious.     MEDICATIONS AT HOME:   Prior to Admission medications   Medication Sig Start Date End Date Taking? Authorizing Provider  dicyclomine (BENTYL) 20 MG tablet Take 1 tablet (20 mg total) by mouth 3 (three) times daily as needed (abdominal pain). 03/16/16   Nance Pear, MD  gabapentin (NEURONTIN) 300 MG capsule Take 300 mg by mouth 3 (three) times daily. 03/04/16   Historical Provider, MD  HYDROcodone-acetaminophen (NORCO/VICODIN) 5-325 MG tablet Take 1 tablet by mouth every 4 (four) hours as needed. 10/06/16   Harvest Dark, MD  JANUVIA 100 MG tablet Take 100 mg by mouth daily. 03/06/16   Historical Provider, MD  metFORMIN (GLUCOPHAGE-XR) 500 MG 24 hr tablet Take 500 mg by mouth daily. 02/04/16   Historical Provider, MD  Multiple Vitamins-Minerals (GNP MENS MULTIPLUS PO) Take 1 tablet by mouth daily.    Historical Provider, MD  ondansetron (ZOFRAN) 4 MG tablet Take 1 tablet (4 mg total) by mouth every 8 (eight) hours as needed for nausea or vomiting. 10/06/16   Lisa Roca, MD  oxyCODONE-acetaminophen (ROXICET) 5-325 MG tablet Take 1 tablet by mouth every 6 (six) hours as needed for severe pain. 03/14/16   Schuyler Amor, MD     VITAL SIGNS:  Blood pressure 112/76, pulse 95, temperature 97.6 F (36.4 C), temperature source Oral, resp. rate (!) 21, height 5\' 9"  (1.753 m),  weight 75.3 kg (166 lb), SpO2 99 %.  PHYSICAL EXAMINATION:  Physical Exam  GENERAL:  71 y.o.-year-old patient lying in the bed with no acute distress.  EYES: Pupils equal, round, reactive to light and accommodation. No scleral icterus. Extraocular muscles intact.  HEENT: Head atraumatic, normocephalic. Oropharynx and nasopharynx clear. No oropharyngeal erythema, moist oral  dry  NECK:  Supple, no jugular venous distention. No thyroid enlargement, no tenderness.  LUNGS: Normal breath sounds bilaterally, no wheezing, rales, rhonchi. No use of accessory muscles of respiration.  CARDIOVASCULAR: S1, S2, tachycardia ABDOMEN: Soft, nontender, nondistended. Bowel sounds present. No organomegaly or mass.  EXTREMITIES: No pedal edema, cyanosis, or clubbing. + 2 pedal & radial pulses b/l.   NEUROLOGIC: Cranial nerves II through XII are intact. No focal Motor or sensory deficits appreciated b/l PSYCHIATRIC: The patient is alert and oriented x 3. Good affect.  SKIN: No obvious rash, lesion, or ulcer.   LABORATORY PANEL:   CBC  Recent Labs Lab 10/10/16 0822  WBC 14.8*  HGB 12.0*  HCT 35.1*  PLT 177   ------------------------------------------------------------------------------------------------------------------  Chemistries   Recent Labs Lab 10/10/16 0822  NA 133*  K 5.0  CL 97*  CO2 23  GLUCOSE 392*  BUN 61*  CREATININE 1.57*  CALCIUM 8.1*  AST 47*  ALT 62  ALKPHOS 60  BILITOT 1.0   ------------------------------------------------------------------------------------------------------------------  Cardiac Enzymes  Recent Labs Lab 10/06/16 1117  TROPONINI <0.03   ------------------------------------------------------------------------------------------------------------------  RADIOLOGY:  No results found.   IMPRESSION AND PLAN:   * Upper GI bleed with melena, hematemesis  And Hematochezia Likely PUD. Start protonix drip Transfuse 1 unit PRBC due to hypotension and significant drop in Hb. Admit to ICU GI - Dr. Allen Norris consulted for EGD Discussed with ICU- Dr. Alva Garnet IVF NPO  * AKI due to ATN from hypotension and dehydration Mild hyperkalemia Start IVF Repeat labs in AM I/Os  * DM NPO SSI  * DVT prophylaxis SCDs  All the records are reviewed and case discussed with ED provider. Management plans discussed with the  patient, family and they are in agreement.  CODE STATUS: FULL CODE  TOTAL CC TIME TAKING CARE OF THIS PATIENT: 40 minutes.   Hillary Bow R M.D on 10/10/2016 at 9:52 AM  Between 7am to 6pm - Pager - [phone]  After 6pm go to [email or web address] - password [email or web address] [facility]  Office  [phone]  CC: Primary care physician; Sabino Snipes KEY, MD  Note: This dictation was prepared with Dragon dictation along with smaller phrase technology. Any transcriptional errors that result from this process are unintentional.

## 2016-10-10 NOTE — Progress Notes (Signed)
RN asked Marda Stalker, NP about how long that blood transfusion should be for and made her aware that current order reads for blood to go in over an hour.  NP gave order for unit of blood to transfuse over 4 hours.

## 2016-10-10 NOTE — Consult Note (Signed)
SURGICAL CONSULTATION NOTE (initial) - cpt: 99254  HISTORY OF PRESENT ILLNESS (HPI):  71 y.o. schizophrenic male presented to [facility] ED after vomiting bright red blood along with black emesis and black tarry stools associated with 2 episodes of passing out at home today. Patient reports he recently presented to ED 4 (4/29) days ago for upper abdominal pain, but was discharged home after Right upper quadrant ultrasound and CT of abdomen and pelvis failed to reveal a specific etiology for his symptoms. Upon arrival of EMS, patient was found to be marginally hypotensive, which improved with IV fluid. Patient reports significant EtOH dependence until one year ago and former IV drug abuse, but he denies any recent or prolonged NSAID's such as Goody Powders or otherwise. Patient just underwent EGD with Dr. Allen Norris, which diagnosed a large ulceration in the first segment of his duodenum with an exposed large pulsatile vessel at the base of the ulcer with some surrounding blood but no evidence of active bleeding at this time. Patient now reports only mild upper abdominal pain and +flatus, denies fever/chills, CP, or SOB.  Surgery is consulted by GI physician Dr. Allen Norris in this context for evaluation and management of large duodenal ulcer with exposed pulsatile artery.  PAST MEDICAL HISTORY (PMH):  Past Medical History:  Diagnosis Date  . Diabetes mellitus without complication ([place])   . Hepatitis C   . Schizophrenia ([place])      PAST SURGICAL HISTORY ([place]):  History reviewed. No pertinent surgical history.   MEDICATIONS:  Prior to Admission medications   Medication Sig Start Date End Date Taking? Authorizing Provider  gabapentin (NEURONTIN) 300 MG capsule Take 600 mg by mouth 4 (four) times daily.  03/04/16  Yes Historical Provider, MD  JANUVIA 100 MG tablet Take 100 mg by mouth daily. 03/06/16  Yes Historical Provider, MD  metFORMIN (GLUCOPHAGE-XR) 500 MG 24 hr tablet Take 1,000 mg by mouth 2 (two) times  daily.  02/04/16  Yes Historical Provider, MD  Multiple Vitamins-Minerals (GNP MENS MULTIPLUS PO) Take 1 tablet by mouth daily.   Yes Historical Provider, MD  dicyclomine (BENTYL) 20 MG tablet Take 1 tablet (20 mg total) by mouth 3 (three) times daily as needed (abdominal pain). Patient not taking: Reported on 10/10/2016 03/16/16   Nance Pear, MD  HYDROcodone-acetaminophen (NORCO/VICODIN) 5-325 MG tablet Take 1 tablet by mouth every 4 (four) hours as needed. Patient not taking: Reported on 10/10/2016 10/06/16   Harvest Dark, MD  ondansetron (ZOFRAN) 4 MG tablet Take 1 tablet (4 mg total) by mouth every 8 (eight) hours as needed for nausea or vomiting. Patient not taking: Reported on 10/10/2016 10/06/16   Lisa Roca, MD  oxyCODONE-acetaminophen (ROXICET) 5-325 MG tablet Take 1 tablet by mouth every 6 (six) hours as needed for severe pain. Patient not taking: Reported on 10/10/2016 03/14/16   Schuyler Amor, MD     ALLERGIES:  No Known Allergies   SOCIAL HISTORY:  Social History   Social History  . Marital status: Widowed    Spouse name: N/A  . Number of children: N/A  . Years of education: N/A   Occupational History  . Not on file.   Social History Main Topics  . Smoking status: Current Every Day Smoker  . Smokeless tobacco: Never Used  . Alcohol use 6.0 oz/week    10 Shots of liquor per week  . Drug use: No  . Sexual activity: Not on file   Other Topics Concern  . Not on file  Social History Narrative  . No narrative on file    FAMILY HISTORY:  Family History  Problem Relation Age of Onset  . Diabetes Sister     REVIEW OF SYSTEMS:  Constitutional: denies weight loss, fever, chills, or sweats  Eyes: denies any other vision changes, history of eye injury  ENT: denies sore throat, hearing problems  Respiratory: denies shortness of breath, wheezing  Cardiovascular: denies chest pain, palpitations  Gastrointestinal: abdominal pain, N/V, and bowel function as per  HPI Genitourinary: denies burning with urination or urinary frequency Musculoskeletal: denies any other joint pains or cramps  Skin: denies any other rashes or skin discolorations  Neurological: denies any other headache, dizziness, weakness  Psychiatric: denies any other depression, anxiety   All other review of systems were negative   VITAL SIGNS:  Temp:  [97.1 F (36.2 C)-98.2 F (36.8 C)] 97.1 F (36.2 C) (05/03 1630) Pulse Rate:  [83-107] 92 (05/03 1700) Resp:  [16-28] 16 (05/03 1700) BP: (86-130)/(62-105) 122/76 (05/03 1700) SpO2:  [95 %-100 %] 100 % (05/03 1700) Weight:  [153 lb 3.5 oz (69.5 kg)-166 lb (75.3 kg)] 153 lb 3.5 oz (69.5 kg) (05/03 1104)     Height: 5\' 9"  (175.3 cm) Weight: 153 lb 3.5 oz (69.5 kg) BMI (Calculated): 22.7   INTAKE/OUTPUT:  This shift: Total I/O In: 2135 [I.V.:1725; Blood:310; IV Piggyback:100] Out: 600 [Urine:600]  Last 2 shifts: @IOLAST2SHIFTS @   PHYSICAL EXAM:  Constitutional:  -- Normal body habitus  -- Awake, alert, and oriented x3  Eyes:  -- Pupils equally round and reactive to light  -- No scleral icterus  Ear, nose, and throat:  -- No jugular venous distension  Pulmonary:  -- No crackles  -- Equal breath sounds bilaterally -- Breathing non-labored at rest Cardiovascular:  -- S1, S2 present  -- No pericardial rubs Gastrointestinal:  -- Abdomen soft, nontender, nondistended, no guarding/rebound  -- No abdominal masses appreciated, pulsatile or otherwise  Musculoskeletal and Integumentary:  -- Wounds or skin discoloration: None appreciated -- Extremities: B/L UE and LE FROM, hands and feet warm, no edema  Neurologic:  -- Motor function: intact and symmetric -- Sensation: intact and symmetric  Labs:  CBC Latest Ref Rng & Units 10/10/2016 10/10/2016 10/06/2016  WBC 3.8 - 10.6 K/uL - 14.8(H) 9.7  Hemoglobin 13.0 - 18.0 g/dL 12.2(L) 12.0(L) 16.6  Hematocrit 40.0 - 52.0 % 34.6(L) 35.1(L) 49.0  Platelets 150 - 440 K/uL - 177 176    BMP:  Lab Results  Component Value Date   GLUCOSE 392 (H) 10/10/2016   CO2 23 10/10/2016   BUN 61 (H) 10/10/2016   CREATININE 1.57 (H) 10/10/2016   CALCIUM 8.1 (L) 10/10/2016     Imaging studies:  Esophagogastricduodenoscopy (Dr. Allen Norris, 10/10/2016) - personally reviewed with Dr. Allen Norris and with patient  - The examined esophagus was normal.  - Moderate inflammation characterized by aphthous ulcerations was found in the gastric body and in the gastric antrum.  - One non-bleeding cratered duodenal ulcer with a visible vessel was found in the duodenal bulb.  Assessment/Plan: (ICD-10's: K25.4) 71 y.o. male with a large ulceration in the first segment of his duodenum with an exposed large pulsatile vessel (GDA) at the base of the ulcer with some surrounding blood but no evidence of active bleeding at this time, to which recent acute hemorrhagic blood loss anemia with syncope x 2 is attributed,  complicated by pertinent comorbidities including DM, hepatitis C, EtOH and IV drug abuse, and schizophrenia with limited routine  medical care.   - check serial Hb/Hct   - close ICU monitoring of BP with target SBP 100 - 120 mmHg   - percutaneous embolization of GDA +/- PDA tomorrow morning by Dr. Lucky Cowboy  - general surgery available for surgical control of bleeding duodenal ulcer as needed   - PPI and medical management of comorbidities as per medical team  All of the above findings and recommendations were discussed with the patient, Dr. Allen Norris and Dr. Lucky Cowboy, and all of patient's questions were answered to his expressed satisfaction.  Thank you for the opportunity to participate in this patient's care.   -- Marilynne Drivers Rosana Hoes, MD, [place]: [facility] - Partnering for exceptional care. Office: [phone]

## 2016-10-10 NOTE — Progress Notes (Signed)
A&Ox4.  Talking to his daughter who is at bedside. Denies pain at this time.  No vomiting and no stool since admission.  NPO for vascular surgery in the morning.

## 2016-10-11 ENCOUNTER — Encounter
Admission: EM | Disposition: A | Discharge status: Home / Self Care | Payer: Self-pay | Source: Home / Self Care | Attending: Internal Medicine

## 2016-10-11 ENCOUNTER — Encounter: Payer: Self-pay | Admitting: Gastroenterology

## 2016-10-11 DIAGNOSIS — K922 Gastrointestinal hemorrhage, unspecified: Secondary | ICD-10-CM

## 2016-10-11 HISTORY — PX: VISCERAL ARTERY INTERVENTION: CATH118277

## 2016-10-11 LAB — CBC
HEMATOCRIT: 31.1 % — AB (ref 40.0–52.0)
HEMOGLOBIN: 10.7 g/dL — AB (ref 13.0–18.0)
MCH: 31.9 pg (ref 26.0–34.0)
MCHC: 34.6 g/dL (ref 32.0–36.0)
MCV: 92.3 fL (ref 80.0–100.0)
Platelets: 134 10*3/uL — ABNORMAL LOW (ref 150–440)
RBC: 3.37 MIL/uL — AB (ref 4.40–5.90)
RDW: 12.9 % (ref 11.5–14.5)
WBC: 10.8 10*3/uL — ABNORMAL HIGH (ref 3.8–10.6)

## 2016-10-11 LAB — HEMOGLOBIN A1C
Hgb A1c MFr Bld: 9 % — ABNORMAL HIGH (ref 4.8–5.6)
Mean Plasma Glucose: 212 mg/dL

## 2016-10-11 LAB — BASIC METABOLIC PANEL
ANION GAP: 3 — AB (ref 5–15)
BUN: 32 mg/dL — ABNORMAL HIGH (ref 6–20)
CHLORIDE: 114 mmol/L — AB (ref 101–111)
CO2: 26 mmol/L (ref 22–32)
Calcium: 7.9 mg/dL — ABNORMAL LOW (ref 8.9–10.3)
Creatinine, Ser: 0.7 mg/dL (ref 0.61–1.24)
GFR calc non Af Amer: >60 mL/min (ref >60)
GLUCOSE: 134 mg/dL — AB (ref 65–99)
POTASSIUM: 3.9 mmol/L (ref 3.5–5.1)
Sodium: 143 mmol/L (ref 135–145)

## 2016-10-11 LAB — GLUCOSE, CAPILLARY
GLUCOSE-CAPILLARY: 118 mg/dL — AB (ref 65–99)
Glucose-Capillary: 115 mg/dL — ABNORMAL HIGH (ref 65–99)
Glucose-Capillary: 120 mg/dL — ABNORMAL HIGH (ref 65–99)
Glucose-Capillary: 104 mg/dL — ABNORMAL HIGH (ref 65–99)

## 2016-10-11 SURGERY — VISCERAL ARTERY INTERVENTION
Anesthesia: Moderate Sedation

## 2016-10-11 MED ORDER — MIDAZOLAM HCL 2 MG/2ML IJ SOLN
INTRAMUSCULAR | Status: DC | PRN
  Administered 2016-10-11 (×2): 2 mg via INTRAVENOUS
  Administered 2016-10-11: 1 mg via INTRAVENOUS

## 2016-10-11 MED ORDER — CEFAZOLIN SODIUM-DEXTROSE 1-4 GM/50ML-% IV SOLN
INTRAVENOUS | Status: AC
  Filled 2016-10-11: qty 50

## 2016-10-11 MED ORDER — FENTANYL CITRATE (PF) 100 MCG/2ML IJ SOLN
INTRAMUSCULAR | Status: AC
Start: 2016-10-11 — End: 2016-10-11
  Filled 2016-10-11: qty 2

## 2016-10-11 MED ORDER — MIDAZOLAM HCL 5 MG/5ML IJ SOLN
INTRAMUSCULAR | Status: AC
  Filled 2016-10-11: qty 5

## 2016-10-11 MED ORDER — FENTANYL CITRATE (PF) 100 MCG/2ML IJ SOLN
INTRAMUSCULAR | Status: AC
  Filled 2016-10-11: qty 2

## 2016-10-11 MED ORDER — HYDROMORPHONE HCL 1 MG/ML IJ SOLN
1.0000 mg | Freq: Once | INTRAMUSCULAR | Status: AC
  Administered 2016-10-11: 1 mg via INTRAVENOUS

## 2016-10-11 MED ORDER — FENTANYL CITRATE (PF) 100 MCG/2ML IJ SOLN
INTRAMUSCULAR | Status: DC | PRN
  Administered 2016-10-11 (×2): 50 ug via INTRAVENOUS

## 2016-10-11 MED ORDER — HYDROMORPHONE HCL 1 MG/ML IJ SOLN
INTRAMUSCULAR | Status: AC
  Filled 2016-10-11: qty 1

## 2016-10-11 MED ORDER — DIPHENHYDRAMINE HCL 50 MG/ML IJ SOLN
INTRAMUSCULAR | Status: AC
  Filled 2016-10-11: qty 1

## 2016-10-11 MED ORDER — LACTATED RINGERS IV SOLN
INTRAVENOUS | Status: DC
  Administered 2016-10-11: 19:00:00 via INTRAVENOUS

## 2016-10-11 MED ORDER — CHLORHEXIDINE GLUCONATE 0.12 % MT SOLN
15.0000 mL | Freq: Two times a day (BID) | OROMUCOSAL | Status: DC
  Administered 2016-10-12 – 2016-10-14 (×5): 15 mL via OROMUCOSAL
  Filled 2016-10-11 (×5): qty 15

## 2016-10-11 MED ORDER — LIDOCAINE-EPINEPHRINE (PF) 2 %-1:200000 IJ SOLN
INTRAMUSCULAR | Status: AC
  Filled 2016-10-11: qty 20

## 2016-10-11 MED ORDER — HEPARIN SODIUM (PORCINE) 1000 UNIT/ML IJ SOLN
INTRAMUSCULAR | Status: AC
  Filled 2016-10-11: qty 1

## 2016-10-11 MED ORDER — DEXTROSE 5 % IV SOLN
1000.0000 mg | Freq: Once | INTRAVENOUS | Status: DC
  Filled 2016-10-11: qty 10

## 2016-10-11 MED ORDER — DIPHENHYDRAMINE HCL 50 MG/ML IJ SOLN
INTRAMUSCULAR | Status: DC | PRN
  Administered 2016-10-11: 50 mg via INTRAVENOUS

## 2016-10-11 MED ORDER — CEFAZOLIN SODIUM-DEXTROSE 1-4 GM/50ML-% IV SOLN
1.0000 g | Freq: Once | INTRAVENOUS | Status: AC
  Administered 2016-10-11: 1 g via INTRAVENOUS

## 2016-10-11 MED ORDER — ORAL CARE MOUTH RINSE
15.0000 mL | Freq: Two times a day (BID) | OROMUCOSAL | Status: DC
  Administered 2016-10-12 – 2016-10-13 (×2): 15 mL via OROMUCOSAL

## 2016-10-11 SURGICAL SUPPLY — 26 items
BLOCK BEAD 300-500 MIC (Vascular Products) ×3 IMPLANT
CATH 5FR REUT (CATHETERS) ×3 IMPLANT
CATH BEACON 5 .035 65 KMP TIP (CATHETERS) ×3 IMPLANT
CATH LANTERN 025 150CM 45TIP (MICROCATHETER) ×2 IMPLANT
CATH MICROCATH PRGRT 110CM (CATHETERS) ×1 IMPLANT
CATH PIG 70CM (CATHETERS) ×3 IMPLANT
COIL 400 COMPLEX SOFT 4X15CM (Vascular Products) ×3 IMPLANT
COIL 400 COMPLEX SOFT 4X6CM (Vascular Products) ×3 IMPLANT
COIL 400 COMPLEX SOFT 6X20CM (Vascular Products) ×3 IMPLANT
COIL 400 COMPLEX STD 5X12CM (Vascular Products) ×3 IMPLANT
COIL 400 COMPLEX STD 6X20CM (Vascular Products) ×3 IMPLANT
DEVICE PRESTO INFLATION (MISCELLANEOUS) ×3 IMPLANT
DEVICE STARCLOSE SE CLOSURE (Vascular Products) ×3 IMPLANT
DEVICE TORQUE (MISCELLANEOUS) ×3 IMPLANT
GLIDEWIRE STIFF .35X180X3 HYDR (WIRE) ×3 IMPLANT
GUIDEWIRE FATHOM 016-180CM (WIRE) ×3 IMPLANT
GUIDEWIRE PFTE-COATED .018X300 (WIRE) ×3 IMPLANT
HANDLE DETACHMENT COIL (MISCELLANEOUS) ×3 IMPLANT
MICROCATH PROGREAT 110CM (CATHETERS) ×3
PACK ANGIOGRAPHY (CUSTOM PROCEDURE TRAY) ×3 IMPLANT
SHEATH ANL2 6FRX45 HC (SHEATH) ×3 IMPLANT
SHEATH BRITE TIP 5FRX11 (SHEATH) ×3 IMPLANT
SYR MEDRAD MARK V 150ML (SYRINGE) ×3 IMPLANT
TUBING CONTRAST HIGH PRESS 72 (TUBING) ×3 IMPLANT
WIRE J 3MM .035X145CM (WIRE) ×3 IMPLANT
WIRE MAGIC TORQUE 260C (WIRE) ×3 IMPLANT

## 2016-10-11 NOTE — Progress Notes (Signed)
[place] [facility] [place] NAME: Lance [name]    MR#:  [id number]  DATE OF BIRTH:  [birth year]  SUBJECTIVE:  CHIEF COMPLAINT:   Chief Complaint  Patient presents with  . GI Bleeding  . Emesis   No further melena or vomiting. EGD showed duodenal ulcer with pulsatile vessel at the base. No active bleeding found.  REVIEW OF SYSTEMS:    Review of Systems  Constitutional: Positive for malaise/fatigue. Negative for chills and fever.  HENT: Negative for sore throat.   Eyes: Negative for blurred vision, double vision and pain.  Respiratory: Negative for cough, hemoptysis, shortness of breath and wheezing.   Cardiovascular: Negative for chest pain, palpitations, orthopnea and leg swelling.  Gastrointestinal: Negative for abdominal pain, constipation, diarrhea, heartburn, nausea and vomiting.  Genitourinary: Negative for dysuria and hematuria.  Musculoskeletal: Negative for back pain and joint pain.  Skin: Negative for rash.  Neurological: Positive for weakness. Negative for sensory change, speech change, focal weakness and headaches.  Endo/Heme/Allergies: Does not bruise/bleed easily.  Psychiatric/Behavioral: Negative for depression. The patient is not nervous/anxious.     DRUG ALLERGIES:  No Known Allergies  VITALS:  Blood pressure 123/84, pulse 82, temperature 97.5 F (36.4 C), temperature source Axillary, resp. rate 20, height 5\' 9"  (1.753 m), weight 68.2 kg (150 lb 5.7 oz), SpO2 100 %.  PHYSICAL EXAMINATION:   Physical Exam  GENERAL:  71 y.o.-year-old patient lying in the bed with no acute distress.  EYES: Pupils equal, round, reactive to light and accommodation. No scleral icterus. Extraocular muscles intact.  HEENT: Head atraumatic, normocephalic. Oropharynx and nasopharynx clear.  NECK:  Supple, no jugular venous distention. No thyroid enlargement, no tenderness.  LUNGS: Normal breath sounds bilaterally, no wheezing, rales, rhonchi. No  use of accessory muscles of respiration.  CARDIOVASCULAR: S1, S2 normal. No murmurs, rubs, or gallops.  ABDOMEN: Soft, nontender, nondistended. Bowel sounds present. No organomegaly or mass.  EXTREMITIES: No cyanosis, clubbing or edema b/l.    NEUROLOGIC: Cranial nerves II through XII are intact. No focal Motor or sensory deficits b/l.   PSYCHIATRIC: The patient is alert and oriented x 3.  SKIN: No obvious rash, lesion, or ulcer.   LABORATORY PANEL:   CBC  Recent Labs Lab 10/11/16 0327  WBC 10.8*  HGB 10.7*  HCT 31.1*  PLT 134*   ------------------------------------------------------------------------------------------------------------------ Chemistries   Recent Labs Lab 10/10/16 0822 10/11/16 0327  NA 133* 143  K 5.0 3.9  CL 97* 114*  CO2 23 26  GLUCOSE 392* 134*  BUN 61* 32*  CREATININE 1.57* 0.70  CALCIUM 8.1* 7.9*  AST 47*  --   ALT 62  --   ALKPHOS 60  --   BILITOT 1.0  --    ------------------------------------------------------------------------------------------------------------------  Cardiac Enzymes  Recent Labs Lab 10/06/16 1117  TROPONINI <0.03   ------------------------------------------------------------------------------------------------------------------  RADIOLOGY:  No results found.   ASSESSMENT AND PLAN:   * Upper GI bleed due to duodenal ulcer with a pulsatile vessel. Patient nothing by mouth Associated acute blood loss anemia status post 1 unit packed RBC transfusion percutaneous embolization of GDA +/- PDA today by vascular surgery  * Acute kidney injury due to hypotension from GI bleed. Resolved  * Diabetes mellitus. On sliding scale insulin.  * DVT prophylaxis with SCDs  All the records are reviewed and case discussed with Care Management/Social Workerr. Management plans discussed with the patient, family and they are in agreement.  CODE STATUS: FULL CODE  DVT  Prophylaxis: SCDs  TOTAL TIME TAKING CARE OF THIS  PATIENT: 30 minutes.   POSSIBLE D/C IN 2-3 DAYS, DEPENDING ON CLINICAL CONDITION.  Hillary Bow R M.D on 10/11/2016 at 10:33 AM  Between 7am to 6pm - Pager - [phone]  After 6pm go to [email or web address] - password [email or web address] [facility]  Office  [phone]  CC: Primary care physician; Sabino Snipes KEY, MD  Note: This dictation was prepared with Dragon dictation along with smaller phrase technology. Any transcriptional errors that result from this process are unintentional.

## 2016-10-11 NOTE — Progress Notes (Signed)
Patient taken to special procedures by bed with this RN on monitoring devices.  Report given to [place], Therapist, sports.

## 2016-10-11 NOTE — Progress Notes (Signed)
Patient alert to self and place.  Drowsy since arriving back to ICU from special procedures.  Arouses easily to voice.  NSR per cardiac monitor with stable blood pressure.  No respiratory distress.  Right groin site free of complications and palpable bilateral pedal pulses.  Report given to [place], Therapist, sports.

## 2016-10-11 NOTE — Progress Notes (Signed)
  Lance Lame, MD [facility]   [address] [place],  [address] Phone: [phone] Fax : [phone]   Subjective: The patient has had no further bleeding overnight. The patient had an EGD with a large duodenal ulcer and a visible vessel seen yesterday. The patient has had no further signs of bleeding with a stable hemoglobin.   Objective: Vital signs in last 24 hours: Vitals:   10/11/16 0800 10/11/16 0830 10/11/16 1135 10/11/16 1146  BP: 128/81 123/84 108/76   Pulse: 75 82    Resp: 17 20 18    Temp:    98.3 F (36.8 C)  TempSrc:    Oral  SpO2: 99% 100% 95%   Weight:      Height:       Weight change:   Intake/Output Summary (Last 24 hours) at 10/11/16 1149 Last data filed at 10/11/16 1142  Gross per 24 hour  Intake             4190 ml  Output             1075 ml  Net             3115 ml     Exam: Heart:: Regular rate and rhythm, S1S2 present or without murmur or extra heart sounds Lungs: normal and clear to auscultation and percussion Abdomen: soft, nontender, normal bowel sounds   Lab Results: @LABTEST2 @ Micro Results: Recent Results (from the past 240 hour(s))  MRSA PCR Screening     Status: None   Collection Time: 10/10/16 11:08 AM  Result Value Ref Range Status   MRSA by PCR NEGATIVE NEGATIVE Final    Comment:        The GeneXpert MRSA Assay (FDA approved for NASAL specimens only), is one component of a comprehensive MRSA colonization surveillance program. It is not intended to diagnose MRSA infection nor to guide or monitor treatment for MRSA infections.    Studies/Results: No results found. Medications: I have reviewed the patient's current medications. Scheduled Meds: . insulin aspart  0-9 Units Subcutaneous Q4H  . lidocaine  1 patch Transdermal Q24H  . [START ON 10/13/2016] pantoprazole  40 mg Intravenous Q12H  . pneumococcal 23 valent vaccine  0.5 mL Intramuscular Tomorrow-1000   Continuous Infusions: . sodium chloride 100  mL/hr at 10/11/16 1138  . pantoprozole (PROTONIX) infusion 8 mg/hr (10/11/16 0835)   PRN Meds:.acetaminophen **OR** acetaminophen, HYDROcodone-acetaminophen, ondansetron **OR** ondansetron (ZOFRAN) IV, polyethylene glycol   Assessment: Active Problems:   Acute upper GI bleed   Hypotension    Plan: This patient had a large one ulcer with visible vessel. The patient has had general surgery and thoracic surgery involved in the care. There is not anything that could be done endoscopically at this time. Continue treating with a PPI.    LOS: 1 day   Lance [name] 10/11/2016, 11:49 AM

## 2016-10-11 NOTE — Progress Notes (Signed)
Initial Nutrition Assessment  DOCUMENTATION CODES:   Severe malnutrition in context of acute illness/injury  INTERVENTION:  1. Monitor for diet advancement per MD/NP/PA  NUTRITION DIAGNOSIS:   Malnutrition related to acute illness as evidenced by percent weight loss, energy intake < or equal to 50% for > or equal to 5 days.  GOAL:   Patient will meet greater than or equal to 90% of their needs  MONITOR:   I & O's, Labs, Weight trends, Diet advancement  REASON FOR ASSESSMENT:   Malnutrition Screening Tool    ASSESSMENT:   Lance [name]  is a 71 y.o. male with a known history of DM, Hepatitis C without cirrhosis presents with abdominal pain, vomiting, hematemesis, hematochezia. Was in ED on Thursday and discharged home. Later started vomiting blood.  Spoke with patient at bedside. He claims he hasn't eaten anything since last Wednesday. States he was vomiting food and liquids during prior to this.  Prior to onset of this illness - he was eating 3 meals per day, changed his diet when he became diabetic and thus lost 30-40# Continues to have HGBA1C of 9. Only diabetic medication at this time is metformin. States he struggles with not eating fried fish but otherwise is doing ok. Normally eats McDonalds or Brendolyn Patty for breakfast. Has someone cook his dinner for him. Also lost his 2nd wife last year, has been going to mental health counseling and continues to work 17.5 hours per week.  Per chart, currently exhibits a severe 16#/10% wt loss over 7 days. No chewing/swallowing concerns. Complains that he wants to eat and drink.  Underwent EGD yesterday for GIB, duodenal ulcer with pulsatile vessel found.  Labs and medications reviewed: NS @ 123mL/hr, Protonix gtt  Diet Order:     Skin:  Reviewed, no issues  Last BM:  10/10/2016  Height:   Ht Readings from Last 1 Encounters:  10/10/16 5\' 9"  (1.753 m)    Weight:   Wt Readings from Last 1 Encounters:  10/11/16 150 lb  5.7 oz (68.2 kg)    Ideal Body Weight:  72.72 kg  BMI:  Body mass index is 22.2 kg/m.  Estimated Nutritional Needs:   Kcal:  1600-1900 calories  Protein:  68-81 gm  Fluid:  >/= 1.6L  EDUCATION NEEDS:   Education needs no appropriate at this time  Lance Anis. Avree Szczygiel, MS, RD LDN Inpatient Clinical Dietitian Pager [phone]

## 2016-10-11 NOTE — Op Note (Signed)
[facility] Percutaneous Study/Intervention Procedural Note     Surgeon(s): [employer]  Assistants: none  Pre-operative Diagnosis: 1. Upper GI bleed   Post-operative diagnosis: Same  Procedure(s) Performed: 1. Ultrasound guidance for vascular access right femoral artery 2. Catheter placement into pancreaticoduodenal artery through the celiac artery from the right femoral approach 3. Aortogram and selective angiogram of the celiac artery, gastroduodenal artery, and pancreaticoduodenal artery 4. Microbead embolization of pancreaticoduodenal artery and gastroduodenal artery with 2 cc of 300-500  polyvinyl alcohol beads.  5.  Coil embolization using two 4 mm Ruby coils to the pancreaticoduodenal artery  6.  Coil embolization using two 6 mm Ruby coils and a single 5 mm Ruby coil to the gastroduodenal artery 7. StarClose closure device right femoral artery  Anesthesia: Moderate conscious sedation for approximately 60 minutes using 5 mg of Versed and 100 mcg of Fentanyl  EBL: 20 cc  Fluoro Time: 13.1 minutes  Contrast: 65 cc  Indications: Patient is a 71 y.o.male with brisk recurrent upper GI bleeding with resultant anemia. The patient has a very large duodenal ulcer that gastroenterology did not feel comfortable performing endoscopic therapy on. General surgery recommended consideration for embolization before proceeding with laparotomy. The patient is brought in for angiography for further evaluation and potential treatment. Risks and benefits are discussed and informed consent is obtained  Procedure: The patient was identified and appropriate procedural time out was performed. The patient was then placed supine on the table and prepped and draped in the usual sterile fashion.Moderate conscious sedation was administered during a face to face encounter with the patient  throughout the procedure with my supervision of the RN administering medicines and monitoring the patient's vital signs, pulse oximetry, telemetry and mental status throughout from the start of the procedure until the patient was taken to the recovery room.  Ultrasound was used to evaluate the right common femoral artery. It was patent . A digital ultrasound image was acquired. A Seldinger needle was used to access the right common femoral artery under direct ultrasound guidance and a permanent image was performed. A 0.035 J wire was advanced without resistance and a 5Fr sheath was placed. Pigtail catheter was placed into the aorta and an aortogram was performed. This demonstrated normal origins of the celiac and spare mesenteric artery and renal arteries. We transitioned to the lateral projection to cannulate the celiac artery. A V S1 catheter was used to selectively cannulate the celiac artery.  This demonstrated typical branching pattern of the celiac artery with the gastroduodenal artery coming off of the hepatic artery. I placed a 6 [place] Ansell sheath and the celiac artery of a Magic torque wire and used a Kumpe catheter and Glidewire to get into the hepatic artery and then down the gastroduodenal artery. Selective imaging showed a large arcade going into the area likely consistent with the duodenal ulcer although active bleeding was not seen. There was then a continuation of a large pancreaticoduodenal artery going transversely distally. I was I will take a appropriate microcatheter out the pancreaticoduodenal artery and place a 4 mm Ruby coil in this artery to stop any backbleeding into the gastroduodenal artery. A second 4 mm Ruby coil was then deployed and successful occlusion was present on angiography. I then instilled 1 cc of 300-500  polyvinyl alcohol beads into the pancreaticoduodenal artery before pulling back the Kumpe catheter and the prograde microcatheter into the main gastroduodenal  artery. An additional cc of 300-500  polyvinyl alcohol beads were  then deployed into the gastroduodenal artery and coil embolization of the gastroduodenal artery was performed up to its initial branch taking care not to completely devascularize the duodenum. The first coils 5 mm in diameter by 12 cm in length in the next 2 coils were 6 mm diameter by 20 cm length Ruby coils. With the catheter back in the hepatic artery successful embolization of the gastroduodenal and pancreaticoduodenal arteries were seen. I elected to terminate the procedure. The diagnostic catheter was removed. StarClose closure device was deployed in usual fashion with excellent hemostatic result. The patient was taken to the recovery room in stable condition having tolerated the procedure well.     Findings: no active bleeding but large GDA in area of known previous bleeding ulcer   Disposition: Patient was taken to the recovery room in stable condition having tolerated the procedure well.  Complications: None  Leotis Pain 10/11/2016 5:48 PM   This note was created with Dragon Medical transcription system. Any errors in dictation are purely unintentional.

## 2016-10-11 NOTE — Progress Notes (Signed)
SURGICAL PROGRESS NOTE (cpt [phone])  Hospital Day(s): 1.   Post op day(s): 1 Day Post-Op.   Interval History: Patient seen and examined, no acute events or new complaints overnight. Patient reports continued resolution of his recent epigastric abdominal pain with +flatus and denies any further nausea, bloody emesis or BM's (bloody/black tarry or otherwise), fever/chills, CP, or SOB.  Review of Systems:  Constitutional: denies fever, chills  HEENT: denies cough or congestion  Respiratory: denies any shortness of breath  Cardiovascular: denies chest pain or palpitations  Gastrointestinal: abdominal pain, N/V, and bowel function as per interval history Genitourinary: denies burning with urination or urinary frequency Musculoskeletal: denies pain, decreased motor or sensation Integumentary: denies any other rashes or skin discolorations Neurological: denies HA or vision/hearing changes   Vital signs in last 24 hours: [min-max] current  Temp:  [97.1 F (36.2 C)-98.8 F (37.1 C)] 97.5 F (36.4 C) (05/04 0200) Pulse Rate:  [67-107] 84 (05/04 0730) Resp:  [13-28] 15 (05/04 0730) BP: (86-130)/(62-105) 106/84 (05/04 0730) SpO2:  [95 %-100 %] 100 % (05/04 0730) Weight:  [150 lb 5.7 oz (68.2 kg)-166 lb (75.3 kg)] 150 lb 5.7 oz (68.2 kg) (05/04 0500)     Height: 5\' 9"  (175.3 cm) Weight: 150 lb 5.7 oz (68.2 kg) BMI (Calculated): 22.7   Intake/Output this shift:  Total I/O In: 125 [I.V.:125] Out: -    Intake/Output last 2 shifts:  @IOLAST2SHIFTS @   Physical Exam:  Constitutional: alert, cooperative and no distress  HENT: normocephalic without obvious abnormality  Eyes: PERRL, EOM's grossly intact and symmetric  Neuro: CN II - XII grossly intact and symmetric without deficit  Respiratory: breathing non-labored at rest  Cardiovascular: regular rate and sinus rhythm  Gastrointestinal: soft, non-tender, and non-distended  Musculoskeletal: UE and LE FROM, no edema or wounds, motor and  sensation grossly intact, NT   Labs:  CBC Latest Ref Rng & Units 10/11/2016 10/10/2016 10/10/2016  WBC 3.8 - 10.6 K/uL 10.8(H) - -  Hemoglobin 13.0 - 18.0 g/dL 10.7(L) 10.8(L) 12.2(L)  Hematocrit 40.0 - 52.0 % 31.1(L) 31.2(L) 34.6(L)  Platelets 150 - 440 K/uL 134(L) - -   CMP Latest Ref Rng & Units 10/11/2016 10/10/2016 10/06/2016  Glucose 65 - 99 mg/dL 134(H) 392(H) 206(H)  BUN 6 - 20 mg/dL 32(H) 61(H) 16  Creatinine 0.61 - 1.24 mg/dL 0.70 1.57(H) 0.86  Sodium 135 - 145 mmol/L 143 133(L) 136  Potassium 3.5 - 5.1 mmol/L 3.9 5.0 4.0  Chloride 101 - 111 mmol/L 114(H) 97(L) 96(L)  CO2 22 - 32 mmol/L 26 23 30   Calcium 8.9 - 10.3 mg/dL 7.9(L) 8.1(L) 9.3  Total Protein 6.5 - 8.1 g/dL - 6.2(L) 8.5(H)  Total Bilirubin 0.3 - 1.2 mg/dL - 1.0 1.2  Alkaline Phos 38 - 126 U/L - 60 82  AST 15 - 41 U/L - 47(H) 68(H)  ALT 17 - 63 U/L - 62 90(H)   Imaging studies: No new pertinent imaging studies   Assessment/Plan: (ICD-10's: K26.4) 71 y.o. male with a large ulceration in the first segment of his duodenum with an exposed large pulsatile vessel (GDA) at the base of the ulcer with some surrounding blood but no evidence of active bleeding at this time, to which recent acute hemorrhagic blood loss anemia with syncope x 2 is attributed,  complicated by pertinent comorbidities including DM, hepatitis C, EtOH and IV drug abuse, and schizophrenia with limited routine medical care.              -  check serial Hb/Hct              - close ICU monitoring of BP with target SBP 100 - 120 mmHg              - percutaneous embolization of GDA +/- PDA later today by Dr. Lucky Cowboy             - general surgery available for surgical control of bleeding duodenal ulcer as needed              - PPI and medical management of comorbidities as per medical team  All of the above findings and recommendations were discussed with the patient, Dr. Allen Norris and Dr. Lucky Cowboy, and all of patient's questions were answered to his expressed  satisfaction.  Thank you for the opportunity to participate in this patient's care.   -- Marilynne Drivers Rosana Hoes, MD, [place]: [facility] - Partnering for exceptional care. Office: [phone]

## 2016-10-11 NOTE — Progress Notes (Signed)
Hemodynamically stable with no evidence of further bleeding. Mesenteric angiogram and embolization planned for later this afternoon. PCCM service is available for assistance while he is in ICU/SDU.  Merton Border, MD PCCM service Mobile [phone] Pager [phone] 10/11/2016 1:44 PM

## 2016-10-12 LAB — TYPE AND SCREEN
ABO/RH(D): O POS
ANTIBODY SCREEN: NEGATIVE
UNIT DIVISION: 0
UNIT DIVISION: 0
Unit division: 0
Unit division: 0

## 2016-10-12 LAB — CBC
HEMATOCRIT: 30.2 % — AB (ref 40.0–52.0)
HEMOGLOBIN: 10.2 g/dL — AB (ref 13.0–18.0)
MCH: 30.6 pg (ref 26.0–34.0)
MCHC: 33.7 g/dL (ref 32.0–36.0)
MCV: 90.8 fL (ref 80.0–100.0)
PLATELETS: 141 10*3/uL — AB (ref 150–440)
RBC: 3.32 MIL/uL — AB (ref 4.40–5.90)
RDW: 13.1 % (ref 11.5–14.5)
WBC: 8.8 10*3/uL (ref 3.8–10.6)

## 2016-10-12 LAB — BPAM RBC
BLOOD PRODUCT EXPIRATION DATE: 201805302359
BLOOD PRODUCT EXPIRATION DATE: 201805302359
Blood Product Expiration Date: 201805282359
Blood Product Expiration Date: 201805302359
ISSUE DATE / TIME: 201805031118
UNIT TYPE AND RH: 5100
UNIT TYPE AND RH: 5100
UNIT TYPE AND RH: 5100
Unit Type and Rh: 5100

## 2016-10-12 LAB — BASIC METABOLIC PANEL
ANION GAP: 6 (ref 5–15)
BUN: 11 mg/dL (ref 6–20)
CHLORIDE: 105 mmol/L (ref 101–111)
CO2: 25 mmol/L (ref 22–32)
CREATININE: 0.49 mg/dL — AB (ref 0.61–1.24)
Calcium: 7.9 mg/dL — ABNORMAL LOW (ref 8.9–10.3)
GFR calc non Af Amer: >60 mL/min (ref >60)
Glucose, Bld: 117 mg/dL — ABNORMAL HIGH (ref 65–99)
POTASSIUM: 3.3 mmol/L — AB (ref 3.5–5.1)
SODIUM: 136 mmol/L (ref 135–145)

## 2016-10-12 LAB — GLUCOSE, CAPILLARY
GLUCOSE-CAPILLARY: 111 mg/dL — AB (ref 65–99)
GLUCOSE-CAPILLARY: 128 mg/dL — AB (ref 65–99)
Glucose-Capillary: 104 mg/dL — ABNORMAL HIGH (ref 65–99)
Glucose-Capillary: 117 mg/dL — ABNORMAL HIGH (ref 65–99)
Glucose-Capillary: 120 mg/dL — ABNORMAL HIGH (ref 65–99)
Glucose-Capillary: 143 mg/dL — ABNORMAL HIGH (ref 65–99)

## 2016-10-12 MED ORDER — INSULIN ASPART 100 UNIT/ML ~~LOC~~ SOLN: 0.0000 [IU] | Freq: Every day | SUBCUTANEOUS | Status: DC

## 2016-10-12 MED ORDER — POTASSIUM CHLORIDE 10 MEQ/100ML IV SOLN
10.0000 meq | INTRAVENOUS | Status: AC
  Administered 2016-10-12 (×4): 10 meq via INTRAVENOUS
  Filled 2016-10-12 (×4): qty 100

## 2016-10-12 MED ORDER — POTASSIUM CHLORIDE 20 MEQ PO PACK
40.0000 meq | PACK | Freq: Once | ORAL | Status: DC
  Filled 2016-10-12: qty 2

## 2016-10-12 MED ORDER — INSULIN ASPART 100 UNIT/ML ~~LOC~~ SOLN
0.0000 [IU] | Freq: Three times a day (TID) | SUBCUTANEOUS | Status: DC
  Administered 2016-10-13 (×2): 2 [IU] via SUBCUTANEOUS
  Administered 2016-10-13: 1 [IU] via SUBCUTANEOUS
  Administered 2016-10-14 (×2): 2 [IU] via SUBCUTANEOUS
  Filled 2016-10-12 (×4): qty 2
  Filled 2016-10-12: qty 1

## 2016-10-12 NOTE — Progress Notes (Signed)
Called Ms. Patria Mane, NP and alerted her of pt.'s K+: 3.3.  NP ordered PO replacement.  Will continue to monitor pt. Closely.

## 2016-10-12 NOTE — Progress Notes (Signed)
Vascular Surgery  Doing well. Hemodynamically stable. No further bleeding. HgB- 10  Denies abdominal pain. Resting. Abdomen- soft, NT/ND  No further Vascular Surgery intervention. Further care per Medicine and GI.

## 2016-10-12 NOTE — Progress Notes (Addendum)
Pt.'s PAD remains with 40 mL of air on R femoral, per orders.  Deflated and re-inflated per policy overnight with no s/s bleeding, strong pulses and no c/o pain. Pt. Slept well O/N, VSS, A & O x 4, but lethargic with a RASS: -1. Pt. Able to MAE and following commands, but weak needing a 1 person assist. UOP: > 1 L. No care concerns at this time, report given to [facility]

## 2016-10-12 NOTE — Progress Notes (Signed)
SURGICAL PROGRESS NOTE (cpt [phone])  Hospital Day(s): 2.   Post op day(s): 1 Day Post-Op.   Interval History: Patient seen and examined, no acute events or new complaints overnight. Patient reports mild RUQ abdominal pain (improved in comparison to pain with presentation) and tolerating PO, denies Right groin pain, N/V, fever/chills, CP, or SOB.  Review of Systems:  Constitutional: denies fever, chills  HEENT: denies cough or congestion  Respiratory: denies any shortness of breath  Cardiovascular: denies chest pain or palpitations  Gastrointestinal: abdominal pain, N/V, and bowel function as per interval history Genitourinary: denies burning with urination or urinary frequency Musculoskeletal: denies pain, decreased motor or sensation Integumentary: denies any other rashes or skin discolorations Neurological: denies HA or vision/hearing changes   Vital signs in last 24 hours: [min-max] current  Temp:  [97.6 F (36.4 C)-99 F (37.2 C)] 99 F (37.2 C) (05/05 0200) Pulse Rate:  [61-99] 71 (05/05 0500) Resp:  [13-20] 14 (05/05 0500) BP: (106-162)/(63-91) 155/82 (05/05 0500) SpO2:  [94 %-100 %] 100 % (05/05 0500) Weight:  [148 lb 9.4 oz (67.4 kg)-150 lb (68 kg)] 148 lb 9.4 oz (67.4 kg) (05/05 0500)     Height: 5\' 9"  (175.3 cm) Weight: 148 lb 9.4 oz (67.4 kg) BMI (Calculated): 22.2   Intake/Output this shift:  No intake/output data recorded.   Intake/Output last 2 shifts:  @IOLAST2SHIFTS @   Physical Exam:  Constitutional: alert, cooperative and no distress  HENT: normocephalic without obvious abnormality  Eyes: PERRL, EOM's grossly intact and symmetric  Neuro: CN II - XII grossly intact and symmetric without deficit  Respiratory: breathing non-labored at rest  Cardiovascular: regular rate and sinus rhythm, Right groin NT without ecchymosis or hematoma Gastrointestinal: soft, minimal RUQ tenderness to palpation, and non-distended  Musculoskeletal: UE and LE FROM, no edema or  wounds, motor and sensation grossly intact, NT   Labs:  CBC Latest Ref Rng & Units 10/12/2016 10/11/2016 10/10/2016  WBC 3.8 - 10.6 K/uL 8.8 10.8(H) -  Hemoglobin 13.0 - 18.0 g/dL 10.2(L) 10.7(L) 10.8(L)  Hematocrit 40.0 - 52.0 % 30.2(L) 31.1(L) 31.2(L)  Platelets 150 - 440 K/uL 141(L) 134(L) -   CMP Latest Ref Rng & Units 10/12/2016 10/11/2016 10/10/2016  Glucose 65 - 99 mg/dL 117(H) 134(H) 392(H)  BUN 6 - 20 mg/dL 11 32(H) 61(H)  Creatinine 0.61 - 1.24 mg/dL 0.49(L) 0.70 1.57(H)  Sodium 135 - 145 mmol/L 136 143 133(L)  Potassium 3.5 - 5.1 mmol/L 3.3(L) 3.9 5.0  Chloride 101 - 111 mmol/L 105 114(H) 97(L)  CO2 22 - 32 mmol/L 25 26 23   Calcium 8.9 - 10.3 mg/dL 7.9(L) 7.9(L) 8.1(L)  Total Protein 6.5 - 8.1 g/dL - - 6.2(L)  Total Bilirubin 0.3 - 1.2 mg/dL - - 1.0  Alkaline Phos 38 - 126 U/L - - 60  AST 15 - 41 U/L - - 47(H)  ALT 17 - 63 U/L - - 62   Imaging studies: No new pertinent imaging studies   Assessment/Plan: (ICD-10's: K26.4) 70 y.o.maledoing well with no evidence of further GI bleeding 1 Day Post-Op s/p percutaneous embolization of GDA and PDA for large ulceration in the first segment of his duodenum with an exposed large pulsatile vessel (GDA) at the base of the ulcer with some surrounding blood (recently bleeding) but no evidence of active bleeding, to which recent acute hemorrhagic blood loss anemia with syncope x 2 is attributed,complicated by pertinent comorbidities including DM, hepatitis C, EtOH and IV drug abuse, and schizophrenia with limited routine medical  care.  - check routine Hb/Hct  - advance diet as tolerated  - PPI and medical management as per medical team - no indication for surgical intervention at this time  All of the above findings and recommendations were discussed with the patient and patient's ICU RN, and all of patient's questions were answered to his expressed satisfaction.  Thank you for the opportunity to participate in this  patient's care.   -- Marilynne Drivers Rosana Hoes, MD, [place]: [place] [facility] - Partnering for exceptional care. Office: [phone]

## 2016-10-12 NOTE — Progress Notes (Signed)
Pt. Had a small run of V-tach- see CV strips, unsustained, currently in NSR- other VSS, discussed with Ms. Patria Mane, NP. Will continue to monitor pt. Closely.

## 2016-10-12 NOTE — Plan of Care (Signed)
Problem: Safety: Goal: Ability to remain free from injury will improve Outcome: Progressing Pt. One assist to stand bedside, tolerated well.  Problem: Nutrition: Goal: Adequate nutrition will be maintained Outcome: Not Progressing Pt. Ordered clear liquid diet for breakfast, attempted to drink a clear liquid beverage with K + supplement which he vomited back up.  Ms. Patria Mane, NP and incoming RN taylor aware.

## 2016-10-12 NOTE — Progress Notes (Signed)
[place] [facility] [place] NAME: Lance [name]    MR#:  [id number]  DATE OF BIRTH:  04-13-1946  SUBJECTIVE:   No further melena or vomiting. EGD showed duodenal ulcer with pulsatile vessel at the base. No active bleeding found. Tolerating po CLD diet REVIEW OF SYSTEMS:    Review of Systems  Constitutional: Positive for malaise/fatigue. Negative for chills and fever.  HENT: Negative for sore throat.   Eyes: Negative for blurred vision, double vision and pain.  Respiratory: Negative for cough, hemoptysis, shortness of breath and wheezing.   Cardiovascular: Negative for chest pain, palpitations, orthopnea and leg swelling.  Gastrointestinal: Negative for abdominal pain, constipation, diarrhea, heartburn, nausea and vomiting.  Genitourinary: Negative for dysuria and hematuria.  Musculoskeletal: Negative for back pain and joint pain.  Skin: Negative for rash.  Neurological: Positive for weakness. Negative for sensory change, speech change, focal weakness and headaches.  Endo/Heme/Allergies: Does not bruise/bleed easily.  Psychiatric/Behavioral: Negative for depression. The patient is not nervous/anxious.     DRUG ALLERGIES:  No Known Allergies  VITALS:  Blood pressure 133/62, pulse 83, temperature 97.7 F (36.5 C), temperature source Axillary, resp. rate (!) 22, height 5\' 9"  (1.753 m), weight 67.4 kg (148 lb 9.4 oz), SpO2 97 %.  PHYSICAL EXAMINATION:   Physical Exam  GENERAL:  71 y.o.-year-old patient lying in the bed with no acute distress.  EYES: Pupils equal, round, reactive to light and accommodation. No scleral icterus. Extraocular muscles intact.  HEENT: Head atraumatic, normocephalic. Oropharynx and nasopharynx clear.  NECK:  Supple, no jugular venous distention. No thyroid enlargement, no tenderness.  LUNGS: Normal breath sounds bilaterally, no wheezing, rales, rhonchi. No use of accessory muscles of respiration.  CARDIOVASCULAR: S1,  S2 normal. No murmurs, rubs, or gallops.  ABDOMEN: Soft, nontender, nondistended. Bowel sounds present. No organomegaly or mass.  EXTREMITIES: No cyanosis, clubbing or edema b/l.    NEUROLOGIC: Cranial nerves II through XII are intact. No focal Motor or sensory deficits b/l.   PSYCHIATRIC: The patient is alert and oriented x 3.  SKIN: No obvious rash, lesion, or ulcer.   LABORATORY PANEL:   CBC  Recent Labs Lab 10/12/16 0321  WBC 8.8  HGB 10.2*  HCT 30.2*  PLT 141*   ------------------------------------------------------------------------------------------------------------------ Chemistries   Recent Labs Lab 10/10/16 0822  10/12/16 0321  NA 133*  < > 136  K 5.0  < > 3.3*  CL 97*  < > 105  CO2 23  < > 25  GLUCOSE 392*  < > 117*  BUN 61*  < > 11  CREATININE 1.57*  < > 0.49*  CALCIUM 8.1*  < > 7.9*  AST 47*  --   --   ALT 62  --   --   ALKPHOS 60  --   --   BILITOT 1.0  --   --   < > = values in this interval not displayed. ------------------------------------------------------------------------------------------------------------------  Cardiac Enzymes  Recent Labs Lab 10/06/16 1117  TROPONINI <0.03   ------------------------------------------------------------------------------------------------------------------  RADIOLOGY:  No results found.   ASSESSMENT AND PLAN:  Lance [name]  is a 71 y.o. male with a known history of DM, Hepatitis C without cirrhosis presents with abdominal pain, vomiting, hematemesis, hematochezia. Was in ED on Thursday and discharged home. Later started vomiting blood. No anti coagulants.  * Upper GI bleed due to duodenal ulcer with a pulsatile vessel. Associated acute blood loss anemia status post 1 unit packed RBC transfusion -s/p  percutaneous embolization of GDA and PDA by vascular surgery on 10/11/16 -CLD hgb stable  * Acute kidney injury due to hypotension from GI bleed. Resolved  * Diabetes mellitus. On sliding scale  insulin.  * DVT prophylaxis with SCDs  Transfer to medical floor  All the records are reviewed and case discussed with Care Management/Social Workerr. Management plans discussed with the patient, family and they are in agreement.  CODE STATUS: FULL CODE  DVT Prophylaxis: SCDs  TOTAL TIME TAKING CARE OF THIS PATIENT: 30 minutes.   POSSIBLE D/C IN 1-2 DAYS, DEPENDING ON CLINICAL CONDITION.  Andjela Wickes M.D on 10/12/2016 at 12:47 PM  Between 7am to 6pm - Pager - [phone]  After 6pm go to [email or web address] - password [email or web address] [place] [facility]  Office  [phone]  CC: Primary care physician; Herminio Commons, MD  Note: This dictation was prepared with Dragon dictation along with smaller phrase technology. Any transcriptional errors that result from this process are unintentional.

## 2016-10-13 DIAGNOSIS — K922 Gastrointestinal hemorrhage, unspecified: Secondary | ICD-10-CM

## 2016-10-13 LAB — GLUCOSE, CAPILLARY
GLUCOSE-CAPILLARY: 183 mg/dL — AB (ref 65–99)
GLUCOSE-CAPILLARY: 186 mg/dL — AB (ref 65–99)
Glucose-Capillary: 126 mg/dL — ABNORMAL HIGH (ref 65–99)
Glucose-Capillary: 196 mg/dL — ABNORMAL HIGH (ref 65–99)

## 2016-10-13 LAB — BASIC METABOLIC PANEL
ANION GAP: 7 (ref 5–15)
BUN: 8 mg/dL (ref 6–20)
CALCIUM: 8.2 mg/dL — AB (ref 8.9–10.3)
CO2: 25 mmol/L (ref 22–32)
CREATININE: 0.52 mg/dL — AB (ref 0.61–1.24)
Chloride: 99 mmol/L — ABNORMAL LOW (ref 101–111)
GFR calc non Af Amer: >60 mL/min (ref >60)
GLUCOSE: 136 mg/dL — AB (ref 65–99)
Potassium: 3.3 mmol/L — ABNORMAL LOW (ref 3.5–5.1)
Sodium: 131 mmol/L — ABNORMAL LOW (ref 135–145)

## 2016-10-13 NOTE — Evaluation (Signed)
Physical Therapy Evaluation Patient Details Name: Lance [name] MRN: [id number] DOB: 1945-07-02 Today's Date: 10/13/2016   History of Present Illness  71 yo male with onset of upper GI bleed with aortogram and selective angiogram of celiac, gastroduodenal and pancreaticoduodenal arteries for duodenal ulcer.  Has AKI  from the bleed complications.  PMHx:  DM, Hep C, EtOH abuse, IV drug abuse, schizophrenia.   Clinical Impression  Pt is assessed for mobility with a considerable difficulty getting to the chair today, fatigued by the effort.  He is expecting to be admitted to ALF in June, and is a participant in the [facility] program.  He is going to remain on the PT caseload in acute care, and will progress as he is able with gait and strengthening.  Will need inpt therapy to follow up as he is unsafe to walk more than short trips.  Progress acutely to increase standing balance and control of LE's which will decrease his need to stay in SNF.      Follow Up Recommendations SNF    Equipment Recommendations  Rolling walker with 5" wheels    Recommendations for Other Services       Precautions / Restrictions Precautions Precautions: Fall Restrictions Weight Bearing Restrictions: No      Mobility  Bed Mobility Overal bed mobility: Needs Assistance Bed Mobility: Supine to Sit     Supine to sit: Min assist;Mod assist     General bed mobility comments: help to lift trunk and to pivot hips to side of bed  Transfers Overall transfer level: Needs assistance Equipment used: Rolling walker (2 wheeled);1 person hand held assist Transfers: Sit to/from Stand Sit to Stand: Mod assist         General transfer comment: reminders for hand placement  Ambulation/Gait Ambulation/Gait assistance: Min assist Ambulation Distance (Feet): 4 Feet Assistive device: Rolling walker (2 wheeled);1 person hand held assist Gait Pattern/deviations: Step-to pattern;Step-through pattern;Shuffle;Decreased stride  length;Narrow base of support;Trunk flexed Gait velocity: reduced Gait velocity interpretation: Below normal speed for age/gender General Gait Details: pt is looking buckled on LE's and takes very small steps to chair  Stairs            Wheelchair Mobility    Modified Rankin (Stroke Patients Only)       Balance                                             Pertinent Vitals/Pain Pain Assessment: No/denies pain    Home Living Family/patient expects to be discharged to:: Private residence Living Arrangements: Alone Available Help at Discharge: Family;Friend(s);Available PRN/intermittently Type of Home: House Home Access: Stairs to enter Entrance Stairs-Rails: Right Entrance Stairs-Number of Steps: 13 Home Layout: One level Home Equipment: Cane - single point Additional Comments: pt states he uses the cane often but has neuropathy in limbs and frequent falls on the stairs    Prior Function Level of Independence: Independent with assistive device(s)         Comments: with falls     Hand Dominance        Extremity/Trunk Assessment   Upper Extremity Assessment Upper Extremity Assessment: Overall WFL for tasks assessed    Lower Extremity Assessment Lower Extremity Assessment: Generalized weakness    Cervical / Trunk Assessment Cervical / Trunk Assessment: Normal  Communication   Communication: No difficulties  Cognition Arousal/Alertness: Awake/alert Behavior During Therapy:  WFL for tasks assessed/performed Overall Cognitive Status: No family/caregiver present to determine baseline cognitive functioning                                        General Comments      Exercises     Assessment/Plan    PT Assessment Patient needs continued PT services  PT Problem List Decreased strength;Decreased range of motion;Decreased activity tolerance;Decreased balance;Decreased mobility;Decreased coordination;Decreased  cognition;Decreased knowledge of use of DME;Decreased safety awareness;Decreased knowledge of precautions;Decreased skin integrity       PT Treatment Interventions DME instruction;Gait training;Stair training;Functional mobility training;Therapeutic activities;Therapeutic exercise;Balance training;Neuromuscular re-education;Patient/family education    PT Goals (Current goals can be found in the Care Plan section)  Acute Rehab PT Goals Patient Stated Goal: to prevent further falls PT Goal Formulation: With patient Time For Goal Achievement: 10/27/16 Potential to Achieve Goals: Good    Frequency Min 2X/week   Barriers to discharge Inaccessible home environment;Decreased caregiver support home alone with 13 stairs to get into house    Co-evaluation               AM-PAC PT "6 Clicks" Daily Activity  Outcome Measure Difficulty turning over in bed (including adjusting bedclothes, sheets and blankets)?: Total Difficulty moving from lying on back to sitting on the side of the bed? : Total Difficulty sitting down on and standing up from a chair with arms (e.g., wheelchair, bedside commode, etc,.)?: Total Help needed moving to and from a bed to chair (including a wheelchair)?: A Little Help needed walking in hospital room?: A Lot Help needed climbing 3-5 steps with a railing? : A Lot 6 Click Score: 10    End of Session Equipment Utilized During Treatment: Gait belt Activity Tolerance: Patient limited by fatigue Patient left: in chair;with call bell/phone within reach;with chair alarm set Nurse Communication: Mobility status PT Visit Diagnosis: Unsteadiness on feet (R26.81);History of falling (Z91.81);Muscle weakness (generalized) (M62.81)    Time: 8119-1478 PT Time Calculation (min) (ACUTE ONLY): 35 min   Charges:   PT Evaluation $PT Eval Moderate Complexity: 1 Procedure PT Treatments $Gait Training: 8-22 mins   PT G Codes:   PT G-Codes **NOT FOR INPATIENT  CLASS** Functional Assessment Tool Used: AM-PAC 6 Clicks Basic Mobility    Ramond Dial 10/13/2016, 3:46 PM   Mee Hives, PT MS Acute Rehab Dept. Number: [place] and [place]

## 2016-10-13 NOTE — Progress Notes (Signed)
SURGICAL PROGRESS NOTE (cpt [phone])  Hospital Day(s): 3.   Post op day(s): 2 Days Post-Op.   Interval History: Patient seen and examined, no acute events or new complaints overnight. Patient reports improved RUQ abdominal pain and tolerating clear liquids diet, +flatus, denies any further GI bleeding, N/V, fever/chills, RLE pain (s/p arterial access for angio), CP, or SOB.  Review of Systems:  Constitutional: denies fever, chills  HEENT: denies cough or congestion  Respiratory: denies any shortness of breath  Cardiovascular: denies chest pain or palpitations  Gastrointestinal: abdominal pain, N/V, and bowel function as per interval history Genitourinary: denies burning with urination or urinary frequency Musculoskeletal: denies pain, decreased motor or sensation Integumentary: denies any other rashes or skin discolorations Neurological: denies HA or vision/hearing changes   Vital signs in last 24 hours: [min-max] current  Temp:  [97.7 F (36.5 C)-99.3 F (37.4 C)] 97.8 F (36.6 C) (05/06 0633) Pulse Rate:  [73-93] 86 (05/06 0633) Resp:  [16-23] 16 (05/06 0633) BP: (125-153)/(62-85) 144/72 (05/06 0633) SpO2:  [96 %-100 %] 100 % (05/06 0633)     Height: 5\' 9"  (175.3 cm) Weight: 148 lb 9.4 oz (67.4 kg) BMI (Calculated): 22.2   Intake/Output this shift:  Total I/O In: 300.4 [I.V.:300.4] Out: -    Intake/Output last 2 shifts:  @IOLAST2SHIFTS @   Physical Exam:  Constitutional: alert, cooperative and no distress  HENT: normocephalic without obvious abnormality  Eyes: PERRL, EOM's grossly intact and symmetric  Neuro: CN II - XII grossly intact and symmetric without deficit  Respiratory: breathing non-labored at rest  Cardiovascular: regular rate and sinus rhythm  Gastrointestinal: soft, non-tender, and non-distended  Musculoskeletal: UE and LE FROM, no edema or wounds, motor and sensation grossly intact, NT   Labs:  CBC Latest Ref Rng & Units 10/12/2016 10/11/2016 10/10/2016   WBC 3.8 - 10.6 K/uL 8.8 10.8(H) -  Hemoglobin 13.0 - 18.0 g/dL 10.2(L) 10.7(L) 10.8(L)  Hematocrit 40.0 - 52.0 % 30.2(L) 31.1(L) 31.2(L)  Platelets 150 - 440 K/uL 141(L) 134(L) -   CMP Latest Ref Rng & Units 10/13/2016 10/12/2016 10/11/2016  Glucose 65 - 99 mg/dL 136(H) 117(H) 134(H)  BUN 6 - 20 mg/dL 8 11 32(H)  Creatinine 0.61 - 1.24 mg/dL 0.52(L) 0.49(L) 0.70  Sodium 135 - 145 mmol/L 131(L) 136 143  Potassium 3.5 - 5.1 mmol/L 3.3(L) 3.3(L) 3.9  Chloride 101 - 111 mmol/L 99(L) 105 114(H)  CO2 22 - 32 mmol/L 25 25 26   Calcium 8.9 - 10.3 mg/dL 8.2(L) 7.9(L) 7.9(L)  Total Protein 6.5 - 8.1 g/dL - - -  Total Bilirubin 0.3 - 1.2 mg/dL - - -  Alkaline Phos 38 - 126 U/L - - -  AST 15 - 41 U/L - - -  ALT 17 - 63 U/L - - -   Imaging studies: No new pertinent imaging studies   Assessment/Plan: (ICD-10's: K26.4) 71 y.o.maledoing well with no evidence of further GI bleeding 2 Days Post-Op s/p percutaneous embolization of GDA and PDA for large ulceration in the first segment of his duodenum with an exposed large pulsatile vessel (GDA) at the base of the ulcer with some surrounding blood (recently bleeding) but no evidence of active bleeding, to which recent acute hemorrhagic blood loss anemia with syncope x 2 is attributed,complicated by AKI and pertinent comorbidities including DM, hepatitis C, EtOH and IV drug abuse, and schizophrenia with limited routine medical care.              - advance diet as tolerated             -  PPI and medical management as per medical team - no indication for surgical intervention at this time  - discharge planning as per primary team  All of the above findings and recommendations were discussed with the patient, and all of patient's questions were answered to his expressed satisfaction.  Thank you for the opportunity to participate in this patient's care.   -- Marilynne Drivers Rosana Hoes, MD, [place]: [facility]  - Partnering for exceptional care. Office: [phone]

## 2016-10-13 NOTE — Progress Notes (Signed)
[facility] [place] NAME: Lance [name]    MR#:  [id number]  DATE OF BIRTH:  11/12/1945  SUBJECTIVE:   No further melena or vomiting. Tolerating po CLD diet REVIEW OF SYSTEMS:    Review of Systems  Constitutional: Positive for malaise/fatigue. Negative for chills and fever.  HENT: Negative for sore throat.   Eyes: Negative for blurred vision, double vision and pain.  Respiratory: Negative for cough, hemoptysis, shortness of breath and wheezing.   Cardiovascular: Negative for chest pain, palpitations, orthopnea and leg swelling.  Gastrointestinal: Negative for abdominal pain, constipation, diarrhea, heartburn, nausea and vomiting.  Genitourinary: Negative for dysuria and hematuria.  Musculoskeletal: Negative for back pain and joint pain.  Skin: Negative for rash.  Neurological: Positive for weakness. Negative for sensory change, speech change, focal weakness and headaches.  Endo/Heme/Allergies: Does not bruise/bleed easily.  Psychiatric/Behavioral: Negative for depression. The patient is not nervous/anxious.     DRUG ALLERGIES:  No Known Allergies  VITALS:  Blood pressure (!) 144/72, pulse 86, temperature 97.8 F (36.6 C), temperature source Oral, resp. rate 16, height 5\' 9"  (1.753 m), weight 65.4 kg (144 lb 3.2 oz), SpO2 100 %.  PHYSICAL EXAMINATION:   Physical Exam  GENERAL:  71 y.o.-year-old patient lying in the bed with no acute distress.  EYES: Pupils equal, round, reactive to light and accommodation. No scleral icterus. Extraocular muscles intact.  HEENT: Head atraumatic, normocephalic. Oropharynx and nasopharynx clear.  NECK:  Supple, no jugular venous distention. No thyroid enlargement, no tenderness.  LUNGS: Normal breath sounds bilaterally, no wheezing, rales, rhonchi. No use of accessory muscles of respiration.  CARDIOVASCULAR: S1, S2 normal. No murmurs, rubs, or gallops.  ABDOMEN: Soft, nontender, nondistended. Bowel  sounds present. No organomegaly or mass.  EXTREMITIES: No cyanosis, clubbing or edema b/l.    NEUROLOGIC: Cranial nerves II through XII are intact. No focal Motor or sensory deficits b/l.   PSYCHIATRIC: The patient is alert and oriented x 3.  SKIN: No obvious rash, lesion, or ulcer.   LABORATORY PANEL:   CBC  Recent Labs Lab 10/12/16 0321  WBC 8.8  HGB 10.2*  HCT 30.2*  PLT 141*   ------------------------------------------------------------------------------------------------------------------ Chemistries   Recent Labs Lab 10/10/16 0822  10/13/16 0446  NA 133*  < > 131*  K 5.0  < > 3.3*  CL 97*  < > 99*  CO2 23  < > 25  GLUCOSE 392*  < > 136*  BUN 61*  < > 8  CREATININE 1.57*  < > 0.52*  CALCIUM 8.1*  < > 8.2*  AST 47*  --   --   ALT 62  --   --   ALKPHOS 60  --   --   BILITOT 1.0  --   --   < > = values in this interval not displayed. ------------------------------------------------------------------------------------------------------------------  Cardiac Enzymes No results for input(s): TROPONINI in the last 168 hours. ------------------------------------------------------------------------------------------------------------------  RADIOLOGY:  No results found.   ASSESSMENT AND PLAN:  Lance [name]  is a 71 y.o. male with a known history of DM, Hepatitis C without cirrhosis presents with abdominal pain, vomiting, hematemesis, hematochezia. Was in ED on Thursday and discharged home. Later started vomiting blood. No anti coagulants.  * Upper GI bleed due to duodenal ulcer with a pulsatile vessel. Associated acute blood loss anemia status post 1 unit packed RBC transfusion -s/p percutaneous embolization of GDA and PDA by vascular surgery on 10/11/16 -CLD--FLD today hgb stable  *  Acute kidney injury due to hypotension from GI bleed. Resolved  * Diabetes mellitus. On sliding scale insulin.  * DVT prophylaxis with SCDs  IF remains stable d/c home in  am  All the records are reviewed and case discussed with Care Management/Social Workerr. Management plans discussed with the patient, family and they are in agreement.  CODE STATUS: FULL CODE  DVT Prophylaxis: SCDs  TOTAL TIME TAKING CARE OF THIS PATIENT: 20 minutes.   POSSIBLE D/C IN 1-2 DAYS, DEPENDING ON CLINICAL CONDITION.  Britanni Yarde M.D on 10/13/2016 at 1:52 PM  Between 7am to 6pm - Pager - [phone]  After 6pm go to [email or web address] - password [email or web address] [facility]  Office  [phone]  CC: Primary care physician; Herminio Commons, MD  Note: This dictation was prepared with Dragon dictation along with smaller phrase technology. Any transcriptional errors that result from this process are unintentional.

## 2016-10-14 ENCOUNTER — Encounter: Payer: Self-pay | Admitting: Vascular Surgery

## 2016-10-14 LAB — GLUCOSE, CAPILLARY
Glucose-Capillary: 161 mg/dL — ABNORMAL HIGH (ref 65–99)
Glucose-Capillary: 190 mg/dL — ABNORMAL HIGH (ref 65–99)

## 2016-10-14 MED ORDER — METFORMIN HCL ER 500 MG PO TB24
1000.0000 mg | ORAL_TABLET | Freq: Two times a day (BID) | ORAL | Status: DC
  Administered 2016-10-14: 1000 mg via ORAL
  Filled 2016-10-14 (×2): qty 2

## 2016-10-14 MED ORDER — ACETAMINOPHEN 325 MG PO TABS
650.0000 mg | ORAL_TABLET | Freq: Four times a day (QID) | ORAL | Status: DC | PRN
  Administered 2016-10-14: 650 mg via ORAL
  Filled 2016-10-14: qty 2

## 2016-10-14 MED ORDER — PANTOPRAZOLE SODIUM 40 MG PO TBEC: 40.0000 mg | DELAYED_RELEASE_TABLET | Freq: Two times a day (BID) | ORAL | 0 refills | Status: DC

## 2016-10-14 MED ORDER — POLYETHYLENE GLYCOL 3350 17 G PO PACK: 17.0000 g | PACK | Freq: Every day | ORAL | 0 refills | Status: DC | PRN

## 2016-10-14 MED ORDER — ENSURE ENLIVE PO LIQD
237.0000 mL | Freq: Two times a day (BID) | ORAL | Status: DC
  Administered 2016-10-14 (×2): 237 mL via ORAL

## 2016-10-14 MED ORDER — ENSURE ENLIVE PO LIQD: 237.0000 mL | Freq: Two times a day (BID) | ORAL | 12 refills | Status: DC

## 2016-10-14 MED ORDER — GABAPENTIN 300 MG PO CAPS
600.0000 mg | ORAL_CAPSULE | Freq: Four times a day (QID) | ORAL | Status: DC
  Administered 2016-10-14 (×2): 600 mg via ORAL
  Filled 2016-10-14 (×2): qty 2

## 2016-10-14 MED ORDER — PANTOPRAZOLE SODIUM 40 MG PO TBEC: 40.0000 mg | DELAYED_RELEASE_TABLET | Freq: Two times a day (BID) | ORAL | Status: DC

## 2016-10-14 NOTE — Progress Notes (Signed)
CC: Abdominal pain Subjective: Patient reports that his abdominal pain has almost resolved. He's feeling much better than he was on admission. He is tolerating a diet and having normal bowel function. He has been very happy with his experience for this hospital stay.  Objective: Vital signs in last 24 hours: Temp:  [98.1 F (36.7 C)-98.4 F (36.9 C)] 98.1 F (36.7 C) (05/07 0433) Pulse Rate:  [77-97] 77 (05/07 0433) Resp:  [17-18] 18 (05/07 0433) BP: (114-137)/(72-93) 115/76 (05/07 0433) SpO2:  [98 %-100 %] 100 % (05/07 0433) Weight:  [65.4 kg (144 lb 3.2 oz)] 65.4 kg (144 lb 3.2 oz) (05/07 0537) Last BM Date: 10/10/16  Intake/Output from previous day: 05/06 0701 - 05/07 0700 In: 1000 [P.O.:1000] Out: 300 [Urine:300] Intake/Output this shift: No intake/output data recorded.  Physical exam:  Gen.: No acute distress  chest: Clear to auscultation Heart: Regular rhythm Abdomen: Soft, nontender, nondistended  Lab Results: CBC   Recent Labs  10/12/16 0321  WBC 8.8  HGB 10.2*  HCT 30.2*  PLT 141*   BMET  Recent Labs  10/12/16 0321 10/13/16 0446  NA 136 131*  K 3.3* 3.3*  CL 105 99*  CO2 25 25  GLUCOSE 117* 136*  BUN 11 8  CREATININE 0.49* 0.52*  CALCIUM 7.9* 8.2*   PT/INR No results for input(s): LABPROT, INR in the last 72 hours. ABG No results for input(s): PHART, HCO3 in the last 72 hours.  Invalid input(s): PCO2, PO2  Studies/Results: No results found.  Anti-infectives: Anti-infectives    Start     Dose/Rate Route Frequency Ordered Stop   10/11/16 1715  ceFAZolin (ANCEF) IVPB 1 g/50 mL premix     1 g 100 mL/hr over 30 Minutes Intravenous  Once 10/11/16 1705 10/11/16 1849   10/11/16 1545  ceFAZolin (ANCEF) 1,000 mg in dextrose 5 % 100 mL IVPB  Status:  Discontinued     1,000 mg 220 mL/hr over 30 Minutes Intravenous  Once 10/11/16 1535 10/11/16 1704      Assessment/Plan:  71 year old male admitted with an upper GI bleed that is treated  endovascularly. Clinically doing much better. Discussed with the patient that at this point should he bleed again he would likely require a surgical intervention. However given he's had no evidence of bleeding over the last 48 hours that this would be unlikely to occur. No plans for any general surgery intervention at this time. Please call again if we can be of further assistance with this patient's care.  Dejia Ebron T. Adonis Huguenin, MD, [facility] General Surgeon [facility]  Day [facility] [phone] Night [facility] [phone] 10/14/2016

## 2016-10-14 NOTE — Progress Notes (Signed)
Pt had large liquid BM with old blood. Notified MD and per MD still okay for pt to discharge as he had not had a BM since the bloody stool when he came in and old blood needed to be passed

## 2016-10-14 NOTE — Progress Notes (Signed)
Per Dr. Posey Pronto okay to place order for ensure 2 times daily

## 2016-10-14 NOTE — Progress Notes (Signed)
Pt A and O x 4. VSS. Pt tolerating diet well. No complaints of pain or nausea. IV removed intact, prescriptions sent across. Pt voiced understanding of discharge instructions with no further questions. Pt discharged via wheelchair with nurse. Report called to RN at [facility]. Pt to leave via car with son in law.

## 2016-10-14 NOTE — Clinical Social Work Note (Signed)
Clinical Social Work Assessment  Patient Details  Name: Lance [name] MRN: [id number] Date of Birth: August 26, 1945  Date of referral:  10/14/16               Reason for consult:  Facility Placement                Permission sought to share information with:  Facility Sport and exercise psychologist, Family Supports Permission granted to share information::  Yes, Verbal Permission Granted  Name::        Agency::     Relationship::     Contact Information:     Housing/Transportation Living arrangements for the past 2 months:  International Paper of Information:  Patient Patient Interpreter Needed:  None Criminal Activity/Legal Involvement Pertinent to Current Situation/Hospitalization:  No - Comment as needed Significant Relationships:  Adult Children Lives with:  Self Do you feel safe going back to the place where you live?  Yes Need for family participation in patient care:  Yes (Comment)  Care giving concerns:  Patient resides in a boarding house.   Social Worker assessment / plan:  CSW spoke with patient regarding PT recommendations for STR. Patient is in agreement to go to rehab but states he does not know the facilities very well. CSW began a bed search and received two offers: Geophysicist/field seismologist and [facility]. Patient asked that I call his daughter regarding which one and CSW contacted patient's daughter via phone. She chose [facility] and is having her boyfriend transport patient today. Discharge information has been sent. Nurse to call report.  Employment status:  Disabled (Comment on whether or not currently receiving Disability) Insurance information:  Medicare PT Recommendations:  [place] / Referral to community resources:     Patient/Family's Response to care:  Patient expressed appreciation for CSW assistance.  Patient/Family's Understanding of and Emotional Response to Diagnosis, Current Treatment, and Prognosis:  Patient is aware that  he would benefit from strengthening at a rehab facility.  Emotional Assessment Appearance:  Appears stated age Attitude/Demeanor/Rapport:   (pleasant and cooperative) Affect (typically observed):  Accepting, Adaptable, Calm, Pleasant, Guarded Orientation:  Oriented to Self, Oriented to Place, Oriented to  Time, Oriented to Situation Alcohol / Substance use:  Not Applicable Psych involvement (Current and /or in the community):  No (Comment)  Discharge Needs  Concerns to be addressed:  Care Coordination Readmission within the last 30 days:  No Current discharge risk:  None Barriers to Discharge:  No Barriers Identified   Shela Leff, LCSW 10/14/2016, 1:43 PM

## 2016-10-14 NOTE — Discharge Summary (Addendum)
[facility] [name] at [place] NAME: Lance [name]    MR#:  [id number]  DATE OF BIRTH:  06-13-1945  DATE OF ADMISSION:  10/10/2016 ADMITTING PHYSICIAN: Hillary Bow, MD  DATE OF DISCHARGE: 10/14/16  PRIMARY CARE PHYSICIAN: Herminio Commons, MD    ADMISSION DIAGNOSIS:  Acute upper GI bleed [K92.2]  DISCHARGE DIAGNOSIS:  Upper GI bleed due to Gastric ULCer DM-2  SECONDARY DIAGNOSIS:   Past Medical History:  Diagnosis Date  . Diabetes mellitus without complication ([place])   . Hepatitis C   . Schizophrenia [facility])     HOSPITAL COURSE:  JamesThompsonis a 71 y.o.malewith a known history of DM, Hepatitis C without cirrhosis presents with abdominal pain, vomiting, hematemesis, hematochezia.  Later started vomiting blood. No anti coagulants.  * Upper GI bleed due to duodenal ulcer with a pulsatile vessel. Associated acute blood loss anemia status post 1 unit packed RBC transfusion -s/p percutaneous embolization of GDA and PDA by vascular surgery on 10/11/16 -CLD--FLD -now on soft diet hgb stable  * Acute kidney injury due to hypotension from GI bleed. Resolved  * Diabetes mellitus. On sliding scale insulin. -resume metformin  * DVT prophylaxis with SCDs  * PT recommends Rehab CSW for d/c planning  spoke with dter Earlie Lou  CONSULTS OBTAINED:  Treatment Team:  Lucilla Lame, MD Algernon Huxley, MD Vickie Epley, MD  DRUG ALLERGIES:  No Known Allergies  DISCHARGE MEDICATIONS:   Current Discharge Medication List    START taking these medications   Details  feeding supplement, ENSURE ENLIVE, (ENSURE ENLIVE) LIQD Take 237 mLs by mouth 2 (two) times daily between meals. Qty: 237 mL, Refills: 12    pantoprazole (PROTONIX) 40 MG tablet Take 1 tablet (40 mg total) by mouth 2 (two) times daily before a meal. Qty: 60 tablet, Refills: 0    polyethylene glycol (MIRALAX / GLYCOLAX) packet Take 17 g by mouth daily as  needed for mild constipation. Qty: 14 each, Refills: 0      CONTINUE these medications which have NOT CHANGED   Details  gabapentin (NEURONTIN) 300 MG capsule Take 600 mg by mouth 4 (four) times daily.  Refills: 2    metFORMIN (GLUCOPHAGE-XR) 500 MG 24 hr tablet Take 1,000 mg by mouth 2 (two) times daily.  Refills: 2    Multiple Vitamins-Minerals (GNP MENS MULTIPLUS PO) Take 1 tablet by mouth daily.      STOP taking these medications     JANUVIA 100 MG tablet         If you experience worsening of your admission symptoms, develop shortness of breath, life threatening emergency, suicidal or homicidal thoughts you must seek medical attention immediately by calling 911 or calling your MD immediately  if symptoms less severe.  You Must read complete instructions/literature along with all the possible adverse reactions/side effects for all the Medicines you take and that have been prescribed to you. Take any new Medicines after you have completely understood and accept all the possible adverse reactions/side effects.   Please note  You were cared for by a hospitalist during your hospital stay. If you have any questions about your discharge medications or the care you received while you were in the hospital after you are discharged, you can call the unit and asked to speak with the hospitalist on call if the hospitalist that took care of you is not available. Once you are discharged, your primary care physician will handle any further  medical issues. Please note that NO REFILLS for any discharge medications will be authorized once you are discharged, as it is imperative that you return to your primary care physician (or establish a relationship with a primary care physician if you do not have one) for your aftercare needs so that they can reassess your need for medications and monitor your lab values. Today   SUBJECTIVE   Doing well  VITAL SIGNS:  Blood pressure 115/76, pulse 77,  temperature 98.1 F (36.7 C), temperature source Oral, resp. rate 18, height 5\' 9"  (1.753 m), weight 65.4 kg (144 lb 3.2 oz), SpO2 100 %.  I/O:    Intake/Output Summary (Last 24 hours) at 10/14/16 1001 Last data filed at 10/14/16 1000  Gross per 24 hour  Intake             1760 ml  Output              300 ml  Net             1460 ml    PHYSICAL EXAMINATION:  GENERAL:  71 y.o.-year-old patient lying in the bed with no acute distress.  EYES: Pupils equal, round, reactive to light and accommodation. No scleral icterus. Extraocular muscles intact.  HEENT: Head atraumatic, normocephalic. Oropharynx and nasopharynx clear.  NECK:  Supple, no jugular venous distention. No thyroid enlargement, no tenderness.  LUNGS: Normal breath sounds bilaterally, no wheezing, rales,rhonchi or crepitation. No use of accessory muscles of respiration.  CARDIOVASCULAR: S1, S2 normal. No murmurs, rubs, or gallops.  ABDOMEN: Soft, non-tender, non-distended. Bowel sounds present. No organomegaly or mass.  EXTREMITIES: No pedal edema, cyanosis, or clubbing.  NEUROLOGIC: Cranial nerves II through XII are intact. Muscle strength 5/5 in all extremities. Sensation intact. Gait not checked.  PSYCHIATRIC: The patient is alert and oriented x 3.  SKIN: No obvious rash, lesion, or ulcer.   DATA REVIEW:   CBC   Recent Labs Lab 10/12/16 0321  WBC 8.8  HGB 10.2*  HCT 30.2*  PLT 141*    Chemistries   Recent Labs Lab 10/10/16 0822  10/13/16 0446  NA 133*  < > 131*  K 5.0  < > 3.3*  CL 97*  < > 99*  CO2 23  < > 25  GLUCOSE 392*  < > 136*  BUN 61*  < > 8  CREATININE 1.57*  < > 0.52*  CALCIUM 8.1*  < > 8.2*  AST 47*  --   --   ALT 62  --   --   ALKPHOS 60  --   --   BILITOT 1.0  --   --   < > = values in this interval not displayed.  Microbiology Results   Recent Results (from the past 240 hour(s))  MRSA PCR Screening     Status: None   Collection Time: 10/10/16 11:08 AM  Result Value Ref Range  Status   MRSA by PCR NEGATIVE NEGATIVE Final    Comment:        The GeneXpert MRSA Assay (FDA approved for NASAL specimens only), is one component of a comprehensive MRSA colonization surveillance program. It is not intended to diagnose MRSA infection nor to guide or monitor treatment for MRSA infections.     RADIOLOGY:  No results found.   Management plans discussed with the patient, family and they are in agreement.  CODE STATUS:     Code Status Orders        Start  Ordered   10/10/16 0947  Full code  Continuous     10/10/16 0947    Code Status History    Date Active Date Inactive Code Status Order ID Comments User Context   This patient has a current code status but no historical code status.      TOTAL TIME TAKING CARE OF THIS PATIENT: *40* minutes.    Annaliese Saez M.D on 10/14/2016 at 10:01 AM  Between 7am to 6pm - Pager - [phone] After 6pm go to [email or web address] - password [email or web address] [place] [facility]  Office  [phone]  CC: Primary care physician; Herminio Commons, MD

## 2016-10-14 NOTE — NC FL2 (Signed)
[place] LEVEL OF CARE SCREENING TOOL     IDENTIFICATION  Patient Name: Lance [name] Birthdate: 1946-03-28 Sex: male Admission Date (Current Location): 10/10/2016  [place] and [place] Number:  Engineering geologist and Address:  [facility], [address] [place], [place] [address]      Provider Number: [id number]  Attending Physician Name and Address:  Fritzi Mandes, MD  Relative Name and Phone Number:       Current Level of Care: Hospital Recommended Level of Care: [place] Prior Approval Number:    Date Approved/Denied:   PASRR Number:    Discharge Plan: SNF    Current Diagnoses: Patient Active Problem List   Diagnosis Date Noted  . Acute upper GI bleed 10/10/2016  . Hypotension     Orientation RESPIRATION BLADDER Height & Weight     Self, Time, Situation, Place  Normal Incontinent Weight: 144 lb 3.2 oz (65.4 kg) Height:  5\' 9"  (175.3 cm)  BEHAVIORAL SYMPTOMS/MOOD NEUROLOGICAL BOWEL NUTRITION STATUS   (none)  (none) Continent Diet (soft)  AMBULATORY STATUS COMMUNICATION OF NEEDS Skin   Limited Assist Verbally Normal                       Personal Care Assistance Level of Assistance  Bathing, Dressing Bathing Assistance: Independent   Dressing Assistance: Independent     Functional Limitations Info   (no issues)          SPECIAL CARE FACTORS FREQUENCY  PT (By licensed PT)                    Contractures Contractures Info: Not present    Additional Factors Info  Code Status               Current Medications (10/14/2016):  This is the current hospital active medication list Current Facility-Administered Medications  Medication Dose Route Frequency Provider Last Rate Last Dose  . chlorhexidine (PERIDEX) 0.12 % solution 15 mL  15 mL Mouth Rinse BID Tukov, Magadalene S, NP   15 mL at 10/14/16 1002  . feeding supplement (ENSURE ENLIVE) (ENSURE ENLIVE) liquid 237 mL  237 mL Oral  BID BM Fritzi Mandes, MD   237 mL at 10/14/16 1002  . gabapentin (NEURONTIN) capsule 600 mg  600 mg Oral QID Fritzi Mandes, MD   600 mg at 10/14/16 1002  . insulin aspart (novoLOG) injection 0-5 Units  0-5 Units Subcutaneous QHS Merton Border B, MD      . insulin aspart (novoLOG) injection 0-9 Units  0-9 Units Subcutaneous TID WC Wilhelmina Mcardle, MD   2 Units at 10/14/16 [phone]  . lidocaine (LIDODERM) 5 % 1 patch  1 patch Transdermal Q24H Varughese, Bincy S, NP   1 patch at 10/13/16 2100  . MEDLINE mouth rinse  15 mL Mouth Rinse q12n4p Tukov, Magadalene S, NP   15 mL at 10/13/16 1638  . metFORMIN (GLUCOPHAGE-XR) 24 hr tablet 1,000 mg  1,000 mg Oral BID WC Fritzi Mandes, MD   1,000 mg at 10/14/16 1002  . ondansetron (ZOFRAN) injection 4 mg  4 mg Intravenous Q6H PRN Hillary Bow, MD   4 mg at 10/10/16 1316  . pantoprazole (PROTONIX) EC tablet 40 mg  40 mg Oral BID AC Patel, Sona, MD      . polyethylene glycol (MIRALAX / GLYCOLAX) packet 17 g  17 g Oral Daily PRN Hillary Bow, MD  Discharge Medications: Please see discharge summary for a list of discharge medications.  Relevant Imaging Results:  Relevant Lab Results:   Additional Information ss: [id number]  Shela Leff, LCSW

## 2016-10-14 NOTE — Care Management Important Message (Signed)
Important Message  Patient Details  Name: Lance [name] MRN: [id number] Date of Birth: 10/16/1945   Medicare Important Message Given:  Yes    Beverly Sessions, RN 10/14/2016, 10:12 AM

## 2016-10-14 NOTE — Progress Notes (Signed)
Pt complaining of abdominal pain. Per Dr. Posey Pronto okay to place order for tylenol 650mg  oral q 6 prn for pain.

## 2016-10-30 ENCOUNTER — Ambulatory Visit: Payer: Medicare Other | Admitting: Gastroenterology

## 2016-10-30 ENCOUNTER — Encounter: Payer: Self-pay | Admitting: Gastroenterology

## 2016-11-19 ENCOUNTER — Telehealth: Payer: Self-pay

## 2016-11-19 NOTE — Telephone Encounter (Signed)
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 I called the [facility] office and was transferred x 4 and no one could tell me where the pt was located or the PCP. Left v/m for Mr Lance [name] that could not find this pt as a [facility] pt.

## 2017-06-18 DIAGNOSIS — E1121 Type 2 diabetes mellitus with diabetic nephropathy: Secondary | ICD-10-CM | POA: Diagnosis not present

## 2017-06-18 DIAGNOSIS — B182 Chronic viral hepatitis C: Secondary | ICD-10-CM | POA: Diagnosis not present

## 2017-06-25 DIAGNOSIS — E119 Type 2 diabetes mellitus without complications: Secondary | ICD-10-CM | POA: Diagnosis not present

## 2017-06-25 DIAGNOSIS — E1140 Type 2 diabetes mellitus with diabetic neuropathy, unspecified: Secondary | ICD-10-CM | POA: Diagnosis not present

## 2017-06-25 DIAGNOSIS — E1121 Type 2 diabetes mellitus with diabetic nephropathy: Secondary | ICD-10-CM | POA: Diagnosis not present

## 2017-07-04 ENCOUNTER — Ambulatory Visit: Payer: Medicare Other | Admitting: Podiatry

## 2017-07-09 ENCOUNTER — Ambulatory Visit (INDEPENDENT_AMBULATORY_CARE_PROVIDER_SITE_OTHER): Payer: Medicare HMO | Admitting: Podiatry

## 2017-07-09 ENCOUNTER — Encounter: Payer: Self-pay | Admitting: Podiatry

## 2017-07-09 ENCOUNTER — Ambulatory Visit (INDEPENDENT_AMBULATORY_CARE_PROVIDER_SITE_OTHER): Payer: Medicaid Other

## 2017-07-09 VITALS — BP 127/86 | HR 102 | Resp 16

## 2017-07-09 DIAGNOSIS — G629 Polyneuropathy, unspecified: Secondary | ICD-10-CM | POA: Diagnosis not present

## 2017-07-09 DIAGNOSIS — E1142 Type 2 diabetes mellitus with diabetic polyneuropathy: Secondary | ICD-10-CM | POA: Diagnosis not present

## 2017-07-09 MED ORDER — GABAPENTIN 600 MG PO TABS: 600.0000 mg | ORAL_TABLET | Freq: Two times a day (BID) | ORAL | 3 refills | Status: DC

## 2017-07-09 NOTE — Progress Notes (Signed)
Subjective:  Patient ID: Lance [name], male    DOB: [birth year],  MRN: [id number] HPI Chief Complaint  Patient presents with  . Diabetes    Patient states he has neuropathy symptoms, takes gabapentin 300mg  TID, burning a lot, toenails long and dark and tender, last a1c 9.0    72 y.o. male presents with the above complaint.   He is currently undergoing treatment for hepatitis C he is also and is taking gabapentin 300 mg 3 tablets 3 times a day.  So he is taking 2700 mg of gabapentin.  He states that he still has night pain.  Past Medical History:  Diagnosis Date  . Diabetes mellitus without complication ([place])   . Hepatitis C   . Schizophrenia [facility])    Past Surgical History:  Procedure Laterality Date  . ESOPHAGOGASTRODUODENOSCOPY (EGD) WITH PROPOFOL N/A 10/10/2016   Procedure: ESOPHAGOGASTRODUODENOSCOPY (EGD) WITH PROPOFOL;  Surgeon: Lucilla Lame, MD;  Location: [facility] ENDOSCOPY;  Service: Endoscopy;  Laterality: N/A;  . VISCERAL ARTERY INTERVENTION N/A 10/11/2016   Procedure: Visceral Artery Intervention;  Surgeon: Algernon Huxley, MD;  Location: [place] [facility];  Service: Cardiovascular;  Laterality: N/A;    Current Outpatient Medications:  .  gabapentin (NEURONTIN) 300 MG capsule, Take by mouth., Disp: , Rfl:  .  metFORMIN (GLUCOPHAGE-XR) 500 MG 24 hr tablet, TAKE 4 TABLETS BY MOUTH EVERY DAY, Disp: , Rfl:  .  sitaGLIPtin (JANUVIA) 100 MG tablet, TAKE 1 TABLET BY MOUTH EVERY DAY, Disp: , Rfl:  .  buprenorphine-naloxone (SUBOXONE) 8-2 mg SUBL SL tablet, Place under the tongue., Disp: , Rfl:  .  feeding supplement, ENSURE ENLIVE, (ENSURE ENLIVE) LIQD, Take 237 mLs by mouth 2 (two) times daily between meals., Disp: 237 mL, Rfl: 12 .  gabapentin (NEURONTIN) 300 MG capsule, Take 600 mg by mouth 4 (four) times daily. , Disp: , Rfl: 2 .  HARVONI 90-400 MG TABS, , Disp: , Rfl:  .  Multiple Vitamins-Minerals (GNP MENS MULTIPLUS PO), Take 1 tablet by mouth daily., Disp: , Rfl:  .   pantoprazole (PROTONIX) 40 MG tablet, Take 1 tablet (40 mg total) by mouth 2 (two) times daily before a meal., Disp: 60 tablet, Rfl: 0 .  polyethylene glycol (MIRALAX / GLYCOLAX) packet, Take 17 g by mouth daily as needed for mild constipation., Disp: 14 each, Rfl: 0  No Known Allergies Review of Systems  All other systems reviewed and are negative.  Objective:   Vitals:   07/09/17 1331  BP: 127/86  Pulse: (!) 102  Resp: 16    General: Well developed, nourished, in no acute distress, alert and oriented x3   Dermatological: Skin is warm, dry and supple bilateral. Nails x 10 are well maintained; remaining integument appears unremarkable at this time. There are no open sores, no preulcerative lesions, no rash or signs of infection present.  Toenails are long thick yellow dystrophic clinically mycotic painful on palpation.  Vascular: Dorsalis Pedis artery and Posterior Tibial artery pedal pulses are 2/4 bilateral with immedate capillary fill time. Pedal hair growth present. No varicosities and no lower extremity edema present bilateral.   Neruologic: Grossly intact via light touch bilateral. Vibratory intact via tuning fork bilateral.  Loss of protective sensation per Semmes Weinstein monofilament to all pedal locations to the level of the ankle.  Patellar and Achilles deep tendon reflexes 2+ bilateral. No Babinski or clonus noted bilateral.   Musculoskeletal: No gross boney pedal deformities bilateral. No pain, crepitus, or limitation noted with foot  and ankle range of motion bilateral. Muscular strength 5/5 in all groups tested bilateral.  Gait: Unassisted, Nonantalgic.    Radiographs:  Demonstrates no acute findings.  Hammertoe deformities are noted bunion deformities are noted Shon Hale arthritic changes are noted  Assessment & Plan:   Assessment: Diabetic peripheral neuropathy pain in limb secondary to onychomycosis.  Plan: Debridement of toenails 1 through 5 bilateral.  I increased  his gabapentin to the maximum dose of 3600 mg a day 1200 mg 3 times a day.     Azadeh Hyder T. [place], [place]

## 2017-07-09 NOTE — Patient Instructions (Signed)

## 2017-07-23 DIAGNOSIS — Z23 Encounter for immunization: Secondary | ICD-10-CM | POA: Diagnosis not present

## 2017-07-23 DIAGNOSIS — F4320 Adjustment disorder, unspecified: Secondary | ICD-10-CM | POA: Diagnosis not present

## 2017-07-23 DIAGNOSIS — Z1329 Encounter for screening for other suspected endocrine disorder: Secondary | ICD-10-CM | POA: Diagnosis not present

## 2017-07-23 DIAGNOSIS — B182 Chronic viral hepatitis C: Secondary | ICD-10-CM | POA: Diagnosis not present

## 2017-08-22 DIAGNOSIS — Z111 Encounter for screening for respiratory tuberculosis: Secondary | ICD-10-CM | POA: Diagnosis not present

## 2017-08-22 DIAGNOSIS — Z23 Encounter for immunization: Secondary | ICD-10-CM | POA: Diagnosis not present

## 2017-08-22 DIAGNOSIS — B182 Chronic viral hepatitis C: Secondary | ICD-10-CM | POA: Diagnosis not present

## 2017-08-22 DIAGNOSIS — Z1329 Encounter for screening for other suspected endocrine disorder: Secondary | ICD-10-CM | POA: Diagnosis not present

## 2017-08-25 ENCOUNTER — Ambulatory Visit
Admission: RE | Admit: 2017-08-25 | Discharge: 2017-08-25 | Disposition: A | Discharge status: Home / Self Care | Payer: Medicaid Other | Source: Ambulatory Visit | Attending: Family Medicine | Admitting: Family Medicine

## 2017-08-25 ENCOUNTER — Other Ambulatory Visit (HOSPITAL_COMMUNITY): Payer: Self-pay | Admitting: Family Medicine

## 2017-08-25 DIAGNOSIS — R7611 Nonspecific reaction to tuberculin skin test without active tuberculosis: Secondary | ICD-10-CM

## 2017-08-25 DIAGNOSIS — F4320 Adjustment disorder, unspecified: Secondary | ICD-10-CM | POA: Diagnosis not present

## 2017-09-17 DIAGNOSIS — Z1329 Encounter for screening for other suspected endocrine disorder: Secondary | ICD-10-CM | POA: Diagnosis not present

## 2017-09-17 DIAGNOSIS — F4320 Adjustment disorder, unspecified: Secondary | ICD-10-CM | POA: Diagnosis not present

## 2017-09-17 DIAGNOSIS — E1140 Type 2 diabetes mellitus with diabetic neuropathy, unspecified: Secondary | ICD-10-CM | POA: Diagnosis not present

## 2017-09-19 DIAGNOSIS — E1121 Type 2 diabetes mellitus with diabetic nephropathy: Secondary | ICD-10-CM | POA: Diagnosis not present

## 2017-10-08 DIAGNOSIS — E1140 Type 2 diabetes mellitus with diabetic neuropathy, unspecified: Secondary | ICD-10-CM | POA: Diagnosis not present

## 2017-10-08 DIAGNOSIS — Z1329 Encounter for screening for other suspected endocrine disorder: Secondary | ICD-10-CM | POA: Diagnosis not present

## 2017-10-08 DIAGNOSIS — E1121 Type 2 diabetes mellitus with diabetic nephropathy: Secondary | ICD-10-CM | POA: Diagnosis not present

## 2017-10-08 DIAGNOSIS — R300 Dysuria: Secondary | ICD-10-CM | POA: Diagnosis not present

## 2017-10-09 ENCOUNTER — Other Ambulatory Visit: Payer: Self-pay | Admitting: *Deleted

## 2017-10-09 ENCOUNTER — Ambulatory Visit (INDEPENDENT_AMBULATORY_CARE_PROVIDER_SITE_OTHER): Payer: Medicare HMO | Admitting: Podiatry

## 2017-10-09 ENCOUNTER — Encounter: Payer: Self-pay | Admitting: Podiatry

## 2017-10-09 DIAGNOSIS — M79676 Pain in unspecified toe(s): Secondary | ICD-10-CM | POA: Diagnosis not present

## 2017-10-09 DIAGNOSIS — B351 Tinea unguium: Secondary | ICD-10-CM | POA: Diagnosis not present

## 2017-10-09 DIAGNOSIS — M79674 Pain in right toe(s): Principal | ICD-10-CM

## 2017-10-09 DIAGNOSIS — E119 Type 2 diabetes mellitus without complications: Secondary | ICD-10-CM

## 2017-10-09 DIAGNOSIS — M79675 Pain in left toe(s): Principal | ICD-10-CM

## 2017-10-09 MED ORDER — TRIAMCINOLONE ACETONIDE 0.025 % EX OINT: 1.0000 "application " | TOPICAL_OINTMENT | Freq: Two times a day (BID) | CUTANEOUS | 0 refills | Status: DC

## 2017-10-09 NOTE — Progress Notes (Signed)
Complaint:  Visit Type: Patient returns to my office for continued preventative foot care services. Complaint: Patient states" my nails have grown long and thick and become painful to walk and wear shoes" Patient has been diagnosed with DM with no foot complications. The patient presents for preventative foot care services. No changes to ROS  Podiatric Exam: Vascular: dorsalis pedis and posterior tibial pulses are palpable bilateral. Capillary return is immediate. Temperature gradient is WNL. Skin turgor WNL  Sensorium: Normal Semmes Weinstein monofilament test. Normal tactile sensation bilaterally. Nail Exam: Pt has thick disfigured discolored nails with subungual debris noted bilateral entire nail hallux through fifth toenails Ulcer Exam: There is no evidence of ulcer or pre-ulcerative changes or infection. Orthopedic Exam: Muscle tone and strength are WNL. No limitations in general ROM. No crepitus or effusions noted. Foot type and digits show no abnormalities. Bony prominences are unremarkable. Skin: No Porokeratosis. No infection or ulcers  Diagnosis:  Onychomycosis, , Pain in right toe, pain in left toes  Treatment & Plan Procedures and Treatment: Consent by patient was obtained for treatment procedures.   Debridement of mycotic and hypertrophic toenails, 1 through 5 bilateral and clearing of subungual debris. No ulceration, no infection noted. Refilled Triamcinalone 0.025% Return Visit-Office Procedure: Patient instructed to return to the office for a follow up visit 3 months for continued evaluation and treatment.    Kaylee Trivett DPM 

## 2017-10-13 ENCOUNTER — Telehealth: Payer: Self-pay | Admitting: Podiatry

## 2017-10-13 DIAGNOSIS — K219 Gastro-esophageal reflux disease without esophagitis: Secondary | ICD-10-CM | POA: Diagnosis not present

## 2017-10-13 DIAGNOSIS — E119 Type 2 diabetes mellitus without complications: Secondary | ICD-10-CM | POA: Diagnosis not present

## 2017-10-13 DIAGNOSIS — F209 Schizophrenia, unspecified: Secondary | ICD-10-CM | POA: Diagnosis not present

## 2017-10-13 DIAGNOSIS — N400 Enlarged prostate without lower urinary tract symptoms: Secondary | ICD-10-CM | POA: Diagnosis not present

## 2017-10-13 NOTE — Telephone Encounter (Signed)
I'm Mr. Dauphinais's care taker. He was there last Thursday and he said you were supposed to send a cream/ointment he uses to the pharmacy. Neither did the pharmacy nor the care home has received it. Please call me back at [phone] or [phone]. Thank you.

## 2017-10-13 NOTE — Telephone Encounter (Signed)
Pt never received creme  Medication, it was sent to wrong Pharmacy. Please send RX to [facility] [place] [address]  Phone number [phone].

## 2017-10-14 MED ORDER — TRIAMCINOLONE ACETONIDE 0.025 % EX OINT: 1.0000 "application " | TOPICAL_OINTMENT | Freq: Two times a day (BID) | CUTANEOUS | 0 refills | Status: DC

## 2017-10-14 NOTE — Telephone Encounter (Signed)
I spoke with caretaker, he requested rx to be sent to [facility].  Medication was sent

## 2017-10-14 NOTE — Addendum Note (Signed)
Addended by: Graceann Congress D on: 10/14/2017 09:12 AM   Modules accepted: Orders

## 2017-10-23 DIAGNOSIS — R52 Pain, unspecified: Secondary | ICD-10-CM | POA: Diagnosis not present

## 2017-10-24 DIAGNOSIS — N419 Inflammatory disease of prostate, unspecified: Secondary | ICD-10-CM | POA: Diagnosis not present

## 2017-10-24 DIAGNOSIS — Z1329 Encounter for screening for other suspected endocrine disorder: Secondary | ICD-10-CM | POA: Diagnosis not present

## 2017-10-27 DIAGNOSIS — I700 Atherosclerosis of aorta: Secondary | ICD-10-CM | POA: Diagnosis not present

## 2017-10-27 DIAGNOSIS — F209 Schizophrenia, unspecified: Secondary | ICD-10-CM | POA: Diagnosis not present

## 2017-10-27 DIAGNOSIS — F319 Bipolar disorder, unspecified: Secondary | ICD-10-CM | POA: Diagnosis not present

## 2017-10-29 DIAGNOSIS — Z1329 Encounter for screening for other suspected endocrine disorder: Secondary | ICD-10-CM | POA: Diagnosis not present

## 2017-10-29 DIAGNOSIS — N419 Inflammatory disease of prostate, unspecified: Secondary | ICD-10-CM | POA: Diagnosis not present

## 2017-11-05 DIAGNOSIS — F17209 Nicotine dependence, unspecified, with unspecified nicotine-induced disorders: Secondary | ICD-10-CM | POA: Diagnosis not present

## 2017-11-05 DIAGNOSIS — E1140 Type 2 diabetes mellitus with diabetic neuropathy, unspecified: Secondary | ICD-10-CM | POA: Diagnosis not present

## 2017-11-08 DIAGNOSIS — F209 Schizophrenia, unspecified: Secondary | ICD-10-CM | POA: Diagnosis not present

## 2017-11-08 DIAGNOSIS — I700 Atherosclerosis of aorta: Secondary | ICD-10-CM | POA: Diagnosis not present

## 2017-11-08 DIAGNOSIS — F319 Bipolar disorder, unspecified: Secondary | ICD-10-CM | POA: Diagnosis not present

## 2017-11-09 DIAGNOSIS — F319 Bipolar disorder, unspecified: Secondary | ICD-10-CM | POA: Diagnosis not present

## 2017-11-09 DIAGNOSIS — F209 Schizophrenia, unspecified: Secondary | ICD-10-CM | POA: Diagnosis not present

## 2017-11-09 DIAGNOSIS — I700 Atherosclerosis of aorta: Secondary | ICD-10-CM | POA: Diagnosis not present

## 2017-11-10 DIAGNOSIS — K219 Gastro-esophageal reflux disease without esophagitis: Secondary | ICD-10-CM | POA: Diagnosis not present

## 2017-11-10 DIAGNOSIS — F319 Bipolar disorder, unspecified: Secondary | ICD-10-CM | POA: Diagnosis not present

## 2017-11-10 DIAGNOSIS — I700 Atherosclerosis of aorta: Secondary | ICD-10-CM | POA: Diagnosis not present

## 2017-11-10 DIAGNOSIS — F209 Schizophrenia, unspecified: Secondary | ICD-10-CM | POA: Diagnosis not present

## 2017-11-10 DIAGNOSIS — N400 Enlarged prostate without lower urinary tract symptoms: Secondary | ICD-10-CM | POA: Diagnosis not present

## 2017-11-10 DIAGNOSIS — E119 Type 2 diabetes mellitus without complications: Secondary | ICD-10-CM | POA: Diagnosis not present

## 2017-12-03 DIAGNOSIS — Z23 Encounter for immunization: Secondary | ICD-10-CM | POA: Diagnosis not present

## 2017-12-03 DIAGNOSIS — Z1389 Encounter for screening for other disorder: Secondary | ICD-10-CM | POA: Diagnosis not present

## 2017-12-03 DIAGNOSIS — Z0000 Encounter for general adult medical examination without abnormal findings: Secondary | ICD-10-CM | POA: Diagnosis not present

## 2017-12-03 DIAGNOSIS — Z1329 Encounter for screening for other suspected endocrine disorder: Secondary | ICD-10-CM | POA: Diagnosis not present

## 2017-12-05 DIAGNOSIS — E1121 Type 2 diabetes mellitus with diabetic nephropathy: Secondary | ICD-10-CM | POA: Diagnosis not present

## 2017-12-05 DIAGNOSIS — B182 Chronic viral hepatitis C: Secondary | ICD-10-CM | POA: Diagnosis not present

## 2017-12-08 DIAGNOSIS — I700 Atherosclerosis of aorta: Secondary | ICD-10-CM | POA: Diagnosis not present

## 2017-12-08 DIAGNOSIS — F319 Bipolar disorder, unspecified: Secondary | ICD-10-CM | POA: Diagnosis not present

## 2017-12-08 DIAGNOSIS — F209 Schizophrenia, unspecified: Secondary | ICD-10-CM | POA: Diagnosis not present

## 2017-12-09 DIAGNOSIS — I700 Atherosclerosis of aorta: Secondary | ICD-10-CM | POA: Diagnosis not present

## 2017-12-09 DIAGNOSIS — F319 Bipolar disorder, unspecified: Secondary | ICD-10-CM | POA: Diagnosis not present

## 2017-12-09 DIAGNOSIS — F209 Schizophrenia, unspecified: Secondary | ICD-10-CM | POA: Diagnosis not present

## 2017-12-10 DIAGNOSIS — F319 Bipolar disorder, unspecified: Secondary | ICD-10-CM | POA: Diagnosis not present

## 2017-12-10 DIAGNOSIS — N400 Enlarged prostate without lower urinary tract symptoms: Secondary | ICD-10-CM | POA: Diagnosis not present

## 2017-12-10 DIAGNOSIS — K219 Gastro-esophageal reflux disease without esophagitis: Secondary | ICD-10-CM | POA: Diagnosis not present

## 2017-12-10 DIAGNOSIS — E119 Type 2 diabetes mellitus without complications: Secondary | ICD-10-CM | POA: Diagnosis not present

## 2017-12-10 DIAGNOSIS — F209 Schizophrenia, unspecified: Secondary | ICD-10-CM | POA: Diagnosis not present

## 2017-12-10 DIAGNOSIS — I700 Atherosclerosis of aorta: Secondary | ICD-10-CM | POA: Diagnosis not present

## 2018-01-05 DIAGNOSIS — G64 Other disorders of peripheral nervous system: Secondary | ICD-10-CM | POA: Diagnosis not present

## 2018-01-05 DIAGNOSIS — K219 Gastro-esophageal reflux disease without esophagitis: Secondary | ICD-10-CM | POA: Diagnosis not present

## 2018-01-05 DIAGNOSIS — K4090 Unilateral inguinal hernia, without obstruction or gangrene, not specified as recurrent: Secondary | ICD-10-CM | POA: Diagnosis not present

## 2018-01-05 DIAGNOSIS — E119 Type 2 diabetes mellitus without complications: Secondary | ICD-10-CM | POA: Diagnosis not present

## 2018-01-08 DIAGNOSIS — F319 Bipolar disorder, unspecified: Secondary | ICD-10-CM | POA: Diagnosis not present

## 2018-01-08 DIAGNOSIS — F209 Schizophrenia, unspecified: Secondary | ICD-10-CM | POA: Diagnosis not present

## 2018-01-08 DIAGNOSIS — I700 Atherosclerosis of aorta: Secondary | ICD-10-CM | POA: Diagnosis not present

## 2018-01-09 DIAGNOSIS — K4090 Unilateral inguinal hernia, without obstruction or gangrene, not specified as recurrent: Secondary | ICD-10-CM | POA: Diagnosis not present

## 2018-01-09 DIAGNOSIS — E1121 Type 2 diabetes mellitus with diabetic nephropathy: Secondary | ICD-10-CM | POA: Diagnosis not present

## 2018-01-09 DIAGNOSIS — Z1329 Encounter for screening for other suspected endocrine disorder: Secondary | ICD-10-CM | POA: Diagnosis not present

## 2018-01-12 ENCOUNTER — Encounter: Payer: Self-pay | Admitting: *Deleted

## 2018-01-12 DIAGNOSIS — E785 Hyperlipidemia, unspecified: Secondary | ICD-10-CM | POA: Diagnosis not present

## 2018-01-15 ENCOUNTER — Ambulatory Visit: Payer: Medicaid Other | Admitting: Podiatry

## 2018-01-22 ENCOUNTER — Encounter: Payer: Self-pay | Admitting: Podiatry

## 2018-01-22 ENCOUNTER — Ambulatory Visit (INDEPENDENT_AMBULATORY_CARE_PROVIDER_SITE_OTHER): Payer: Medicare HMO | Admitting: Podiatry

## 2018-01-22 DIAGNOSIS — E119 Type 2 diabetes mellitus without complications: Secondary | ICD-10-CM

## 2018-01-22 DIAGNOSIS — M79675 Pain in left toe(s): Secondary | ICD-10-CM | POA: Diagnosis not present

## 2018-01-22 DIAGNOSIS — B351 Tinea unguium: Secondary | ICD-10-CM

## 2018-01-22 DIAGNOSIS — M79674 Pain in right toe(s): Secondary | ICD-10-CM | POA: Diagnosis not present

## 2018-01-22 MED ORDER — TRIAMCINOLONE ACETONIDE 0.025 % EX OINT: 1.0000 "application " | TOPICAL_OINTMENT | Freq: Two times a day (BID) | CUTANEOUS | 0 refills | Status: DC

## 2018-01-22 NOTE — Progress Notes (Signed)
Complaint:  Visit Type: Patient returns to my office for continued preventative foot care services. Complaint: Patient states" my nails have grown long and thick and become painful to walk and wear shoes" Patient has been diagnosed with DM with no foot complications. The patient presents for preventative foot care services. No changes to ROS  Podiatric Exam: Vascular: dorsalis pedis and posterior tibial pulses are palpable bilateral. Capillary return is immediate. Temperature gradient is WNL. Skin turgor WNL  Sensorium: Normal Semmes Weinstein monofilament test. Normal tactile sensation bilaterally. Nail Exam: Pt has thick disfigured discolored nails with subungual debris noted bilateral entire nail hallux through fifth toenails Ulcer Exam: There is no evidence of ulcer or pre-ulcerative changes or infection. Orthopedic Exam: Muscle tone and strength are WNL. No limitations in general ROM. No crepitus or effusions noted. Foot type and digits show no abnormalities. Bony prominences are unremarkable. Skin: No Porokeratosis. No infection or ulcers  Diagnosis:  Onychomycosis, , Pain in right toe, pain in left toes  Treatment & Plan Procedures and Treatment: Consent by patient was obtained for treatment procedures.   Debridement of mycotic and hypertrophic toenails, 1 through 5 bilateral and clearing of subungual debris. No ulceration, no infection noted. Refilled Triamcinalone 0.025% Return Visit-Office Procedure: Patient instructed to return to the office for a follow up visit 3 months for continued evaluation and treatment.    Gardiner Barefoot DPM

## 2018-02-02 DIAGNOSIS — N400 Enlarged prostate without lower urinary tract symptoms: Secondary | ICD-10-CM | POA: Diagnosis not present

## 2018-02-02 DIAGNOSIS — E119 Type 2 diabetes mellitus without complications: Secondary | ICD-10-CM | POA: Diagnosis not present

## 2018-02-02 DIAGNOSIS — K219 Gastro-esophageal reflux disease without esophagitis: Secondary | ICD-10-CM | POA: Diagnosis not present

## 2018-02-02 DIAGNOSIS — F319 Bipolar disorder, unspecified: Secondary | ICD-10-CM | POA: Diagnosis not present

## 2018-02-08 DIAGNOSIS — I700 Atherosclerosis of aorta: Secondary | ICD-10-CM | POA: Diagnosis not present

## 2018-02-08 DIAGNOSIS — F209 Schizophrenia, unspecified: Secondary | ICD-10-CM | POA: Diagnosis not present

## 2018-02-08 DIAGNOSIS — F319 Bipolar disorder, unspecified: Secondary | ICD-10-CM | POA: Diagnosis not present

## 2018-02-09 DIAGNOSIS — F209 Schizophrenia, unspecified: Secondary | ICD-10-CM | POA: Diagnosis not present

## 2018-02-09 DIAGNOSIS — F319 Bipolar disorder, unspecified: Secondary | ICD-10-CM | POA: Diagnosis not present

## 2018-02-09 DIAGNOSIS — I700 Atherosclerosis of aorta: Secondary | ICD-10-CM | POA: Diagnosis not present

## 2018-02-10 ENCOUNTER — Ambulatory Visit: Payer: Medicaid Other | Admitting: General Surgery

## 2018-02-10 DIAGNOSIS — F319 Bipolar disorder, unspecified: Secondary | ICD-10-CM | POA: Diagnosis not present

## 2018-02-10 DIAGNOSIS — I700 Atherosclerosis of aorta: Secondary | ICD-10-CM | POA: Diagnosis not present

## 2018-02-10 DIAGNOSIS — F209 Schizophrenia, unspecified: Secondary | ICD-10-CM | POA: Diagnosis not present

## 2018-02-19 ENCOUNTER — Other Ambulatory Visit: Payer: Self-pay

## 2018-02-19 ENCOUNTER — Emergency Department: Payer: Medicare HMO

## 2018-02-19 ENCOUNTER — Emergency Department
Admission: EM | Admit: 2018-02-19 | Discharge: 2018-02-19 | Disposition: A | Discharge status: Home / Self Care | Payer: Medicare HMO | Attending: Emergency Medicine | Admitting: Emergency Medicine

## 2018-02-19 DIAGNOSIS — F17200 Nicotine dependence, unspecified, uncomplicated: Secondary | ICD-10-CM | POA: Insufficient documentation

## 2018-02-19 DIAGNOSIS — Z79899 Other long term (current) drug therapy: Secondary | ICD-10-CM | POA: Diagnosis not present

## 2018-02-19 DIAGNOSIS — Z7984 Long term (current) use of oral hypoglycemic drugs: Secondary | ICD-10-CM | POA: Insufficient documentation

## 2018-02-19 DIAGNOSIS — E1140 Type 2 diabetes mellitus with diabetic neuropathy, unspecified: Secondary | ICD-10-CM | POA: Diagnosis not present

## 2018-02-19 DIAGNOSIS — R1032 Left lower quadrant pain: Secondary | ICD-10-CM

## 2018-02-19 DIAGNOSIS — K4090 Unilateral inguinal hernia, without obstruction or gangrene, not specified as recurrent: Secondary | ICD-10-CM | POA: Diagnosis not present

## 2018-02-19 LAB — URINALYSIS, COMPLETE (UACMP) WITH MICROSCOPIC
Bacteria, UA: NONE SEEN
Bilirubin Urine: NEGATIVE
GLUCOSE, UA: 150 mg/dL — AB
Ketones, ur: NEGATIVE mg/dL
Nitrite: NEGATIVE
PH: 6 (ref 5.0–8.0)
PROTEIN: NEGATIVE mg/dL
SPECIFIC GRAVITY, URINE: 1.018 (ref 1.005–1.030)

## 2018-02-19 LAB — CBC
HEMATOCRIT: 35.9 % — AB (ref 40.0–52.0)
HEMOGLOBIN: 12.6 g/dL — AB (ref 13.0–18.0)
MCH: 31.4 pg (ref 26.0–34.0)
MCHC: 35.1 g/dL (ref 32.0–36.0)
MCV: 89.5 fL (ref 80.0–100.0)
Platelets: 193 10*3/uL (ref 150–440)
RBC: 4.01 MIL/uL — ABNORMAL LOW (ref 4.40–5.90)
RDW: 13.4 % (ref 11.5–14.5)
WBC: 7.3 10*3/uL (ref 3.8–10.6)

## 2018-02-19 LAB — BASIC METABOLIC PANEL
ANION GAP: 9 (ref 5–15)
BUN: 11 mg/dL (ref 8–23)
CHLORIDE: 105 mmol/L (ref 98–111)
CO2: 27 mmol/L (ref 22–32)
Calcium: 9.2 mg/dL (ref 8.9–10.3)
Creatinine, Ser: 0.74 mg/dL (ref 0.61–1.24)
GFR calc Af Amer: >60 mL/min (ref >60)
GLUCOSE: 134 mg/dL — AB (ref 70–99)
POTASSIUM: 4.2 mmol/L (ref 3.5–5.1)
Sodium: 141 mmol/L (ref 135–145)

## 2018-02-19 MED ORDER — TRAMADOL HCL 50 MG PO TABS
50.0000 mg | ORAL_TABLET | ORAL | Status: AC
  Administered 2018-02-19: 50 mg via ORAL
  Filled 2018-02-19: qty 1

## 2018-02-19 NOTE — ED Provider Notes (Signed)
[facility] Emergency Department Provider Note   ____________________________________________   First MD Initiated Contact with Patient 02/19/18 1515     (approximate)  I have reviewed the triage vital signs and the nursing notes.   HISTORY  Chief Complaint Hernia    HPI Lance [name] is a 72 y.o. male said about 2 to 3 days of intermittent pain in his left groin.  Reports he saw his primary care doctor and was diagnosed with a "hernia" and has an appointment in 1 week to see surgery.  Reports occasionally feels slightly swollen in the left groin it comes and goes causes pain discomfort off and on.  No nausea or vomiting.  Had some pain and discomfort this morning worse when he walks and stands, currently not experiencing pain except for a very slight discomfort in the left lower groin.  Usually takes tramadol at his group home  No chest pain or trouble breathing.  No fevers or chills.  Has not noticed any blood in his urine.  No pain or burning with urination.  Reports when the pain gums it is very uncomfortable, he is hoping to be able to see a doctor for the pain soon.  Past Medical History:  Diagnosis Date  . Diabetes mellitus without complication ([place])   . Hepatitis C   . Schizophrenia [facility])     Patient Active Problem List   Diagnosis Date Noted  . Acute upper GI bleed 10/10/2016  . Hypotension   . Perianal abscess 08/31/2014  . 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 Adenomatous polyp of colon 08/30/2013  . Type 2 diabetes, controlled, with neuropathy ([place]) 08/26/2013  . Chronic viral hepatitis C ([place]) 03/21/2013  . Need for hepatitis B vaccination 03/21/2013  . Depression 10/23/2012  . IVDU (intravenous drug user) 10/23/2012  . Schizophrenia ([place]) 10/23/2012  . Carpal tunnel syndrome on both sides 07/31/2012  . Diabetic peripheral neuropathy ([place]) 07/31/2012  . Osteoarthritis of both hips 07/31/2012    Past  Surgical History:  Procedure Laterality Date  . ESOPHAGOGASTRODUODENOSCOPY (EGD) WITH PROPOFOL N/A 10/10/2016   Procedure: ESOPHAGOGASTRODUODENOSCOPY (EGD) WITH PROPOFOL;  Surgeon: Lucilla Lame, MD;  Location: [facility] ENDOSCOPY;  Service: Endoscopy;  Laterality: N/A;  . VISCERAL ARTERY INTERVENTION N/A 10/11/2016   Procedure: Visceral Artery Intervention;  Surgeon: Algernon Huxley, MD;  Location: [place] [facility];  Service: Cardiovascular;  Laterality: N/A;    Prior to Admission medications   Medication Sig Start Date End Date Taking? Authorizing Provider  buprenorphine-naloxone (SUBOXONE) 8-2 mg SUBL SL tablet Place under the tongue.    [provider]  feeding supplement, ENSURE ENLIVE, (ENSURE ENLIVE) LIQD Take 237 mLs by mouth 2 (two) times daily between meals. 10/14/16   Fritzi Mandes, MD  gabapentin (NEURONTIN) 600 MG tablet Take 1 tablet (600 mg total) by mouth 2 (two) times daily. 07/09/17   Hyatt, Max T, DPM  HARVONI 90-400 MG TABS  07/02/17   [provider]  metFORMIN (GLUCOPHAGE-XR) 500 MG 24 hr tablet TAKE 4 TABLETS BY MOUTH EVERY DAY 02/23/15   [provider]  Multiple Vitamins-Minerals (GNP MENS MULTIPLUS PO) Take 1 tablet by mouth daily.    [provider]  pantoprazole (PROTONIX) 40 MG tablet Take 1 tablet (40 mg total) by mouth 2 (two) times daily before a meal. 10/14/16   Fritzi Mandes, MD  polyethylene glycol (MIRALAX / GLYCOLAX) packet Take 17 g by mouth daily as needed for mild constipation. 10/14/16  Fritzi Mandes, MD  sitaGLIPtin (JANUVIA) 100 MG tablet TAKE 1 TABLET BY MOUTH EVERY DAY 01/23/16   [provider]  triamcinolone (KENALOG) 0.025 % ointment Apply 1 application topically 2 (two) times daily. 01/22/18   Gardiner Barefoot, DPM    Allergies Patient has no known allergies.  Family History  Problem Relation Age of Onset  . Diabetes Sister     Social History Social History   Tobacco Use  . Smoking status: Current Every Day Smoker  .  Smokeless tobacco: Never Used  Substance Use Topics  . Alcohol use: Yes    Alcohol/week: 10.0 standard drinks    Types: 10 Shots of liquor per week  . Drug use: No    Review of Systems Constitutional: No fever/chills Eyes: No visual changes. ENT: No sore throat. Cardiovascular: Denies chest pain. Respiratory: Denies shortness of breath. Gastrointestinal:   No nausea, no vomiting.  No diarrhea.  No constipation. Genitourinary: Negative for dysuria. Musculoskeletal: Negative for back pain. Skin: Negative for rash. Neurological: Negative for headaches, focal weakness or numbness.    ____________________________________________   PHYSICAL EXAM:  VITAL SIGNS: ED Triage Vitals  Enc Vitals Group     BP 02/19/18 1307 133/76     Pulse Rate 02/19/18 1306 95     Resp 02/19/18 1306 16     Temp 02/19/18 1306 97.9 F (36.6 C)     Temp Source 02/19/18 1306 Oral     SpO2 02/19/18 1306 100 %     Weight 02/19/18 1306 196 lb (88.9 kg)     Height 02/19/18 1306 5\' 9"  (1.753 m)     Head Circumference --      Peak Flow --      Pain Score 02/19/18 1306 10     Pain Loc --      Pain Edu? --      Excl. in [place]? --     Constitutional: Alert and oriented. Well appearing and in no acute distress.  Very pleasant.  Well-dressed. Eyes: Conjunctivae are normal. Head: Atraumatic. Nose: No congestion/rhinnorhea. Mouth/Throat: Mucous membranes are moist. Neck: No stridor.   Cardiovascular: Normal rate, regular rhythm. Grossly normal heart sounds.  Good peripheral circulation. Respiratory: Normal respiratory effort.  No retractions. Lungs CTAB. Gastrointestinal: Soft and nontender. No distention.  Right inguinal canal nontender.  Left inguinal canal nontender, when he bears down question if there may be just a slight feeling of a small hernia present, but is rapidly reduced and goes away when he stops bearing down.  No testicular pain.  Normal circumcised penis Musculoskeletal: No lower extremity  tenderness nor edema. Neurologic:  Normal speech and language. No gross focal neurologic deficits are appreciated.  Skin:  Skin is warm, dry and intact. No rash noted. Psychiatric: Mood and affect are normal. Speech and behavior are normal.  ____________________________________________   LABS (all labs ordered are listed, but only abnormal results are displayed)  Labs Reviewed  URINALYSIS, COMPLETE (UACMP) WITH MICROSCOPIC - Abnormal; Notable for the following components:      Result Value   Color, Urine YELLOW (*)    APPearance CLEAR (*)    Glucose, UA 150 (*)    Hgb urine dipstick SMALL (*)    Leukocytes, UA TRACE (*)    All other components within normal limits  BASIC METABOLIC PANEL - Abnormal; Notable for the following components:   Glucose, Bld 134 (*)    All other components within normal limits  CBC - Abnormal; Notable for the following  components:   RBC 4.01 (*)    Hemoglobin 12.6 (*)    HCT 35.9 (*)    All other components within normal limits   ____________________________________________  EKG   ____________________________________________  RADIOLOGY   CT reviewed by me, soft tissue thickening left groin ____________________________________________   PROCEDURES  Procedure(s) performed: None  Procedures  Critical Care performed: No  ____________________________________________   INITIAL IMPRESSION / ASSESSMENT AND PLAN / ED COURSE  Pertinent labs & imaging results that were available during my care of the patient were reviewed by me and considered in my medical decision making (see chart for details).  Clinical history suggestive of probable left inguinal hernia.  No evidence of incarceration by clinical history or exam at this time.  Reassuring exam with normal vital signs.  CT scan performed does not demonstrate any incarcerated hernia, though it is suspicious to me that there could be a small hernia responsible for his intermittent discomfort and  symptoms.  Has general surgery appointment set up for September 20, discussed with patient and advised and recommended he call to schedule a sooner appointment if possible.  Counseled on return precautions.  Return precautions and treatment recommendations and follow-up discussed with the patient who is agreeable with the plan.       ____________________________________________   FINAL CLINICAL IMPRESSION(S) / ED DIAGNOSES  Final diagnoses:  Left groin pain      NEW MEDICATIONS STARTED DURING THIS VISIT:  New Prescriptions   No medications on file     Note:  This document was prepared using Dragon voice recognition software and may include unintentional dictation errors.     Delman Kitten, MD 02/19/18 1550

## 2018-02-19 NOTE — ED Triage Notes (Signed)
Called pt for triage, no answer 

## 2018-02-19 NOTE — ED Triage Notes (Signed)
Pt arrives to ED stating he has a hernia in groin on L side. States he can't take the pain anymore. Alert, oriented. In wheelchair. States gave urine sample at doctor this AM but states "I can't wait no more, I've been waiting 2-3 weeks now."

## 2018-02-19 NOTE — Discharge Instructions (Signed)

## 2018-02-27 ENCOUNTER — Encounter: Payer: Self-pay | Admitting: Surgery

## 2018-02-27 ENCOUNTER — Ambulatory Visit (INDEPENDENT_AMBULATORY_CARE_PROVIDER_SITE_OTHER): Payer: Medicare HMO | Admitting: Surgery

## 2018-02-27 VITALS — BP 112/82 | HR 108 | Temp 98.4°F | Wt 168.0 lb

## 2018-02-27 DIAGNOSIS — R1032 Left lower quadrant pain: Secondary | ICD-10-CM

## 2018-02-27 NOTE — Progress Notes (Signed)
02/27/2018  Reason for Visit:  Left inguinal hernia  Referring Provider:  Elyse Jarvis, MD  History of Present Illness: Lance [name] is a 72 y.o. male referred to [place] for evaluation of a left inguinal hernia.  She had actually been to the emergency room on 9/12 with left groin pain CT scan was obtained on that point.  Reviewing the CT scan the patient does not have a left inguinal hernia.  The read was transcribed incorrectly.  The patient does report that over the last few months he has been having burning and pain where he describes as the left groin but is constant.  He does not feel a bulge that is very inconsistent and unclear with his own medical history.  He had first mentioned that he has never had any hernia repairs and then later on he did.  Unable to obtain a full history due to the patient's inconsistencies.  Past Medical History: Past Medical History:  Diagnosis Date  . Diabetes mellitus without complication ([place])   . Hepatitis C   . Schizophrenia [facility])      Past Surgical History: Past Surgical History:  Procedure Laterality Date  . ESOPHAGOGASTRODUODENOSCOPY (EGD) WITH PROPOFOL N/A 10/10/2016   Procedure: ESOPHAGOGASTRODUODENOSCOPY (EGD) WITH PROPOFOL;  Surgeon: Lucilla Lame, MD;  Location: [facility] ENDOSCOPY;  Service: Endoscopy;  Laterality: N/A;  . VISCERAL ARTERY INTERVENTION N/A 10/11/2016   Procedure: Visceral Artery Intervention;  Surgeon: Algernon Huxley, MD;  Location: [place] [facility];  Service: Cardiovascular;  Laterality: N/A;    Home Medications: Prior to Admission medications   Medication Sig Start Date End Date Taking? Authorizing Provider  acetaminophen (TYLENOL) 325 MG tablet Take 650 mg by mouth every 4 (four) hours as needed.   Yes [provider]  albuterol (PROVENTIL HFA;VENTOLIN HFA) 108 (90 Base) MCG/ACT inhaler Inhale into the lungs every 6 (six) hours as needed for wheezing or shortness of breath.   Yes [provider]   atorvastatin (LIPITOR) 40 MG tablet Take 1 tablet (40 mg total) by mouth once daily. 02/17/15  Yes [provider]  buprenorphine-naloxone (SUBOXONE) 8-2 mg SUBL SL tablet Place under the tongue.   Yes [provider]  dicyclomine (BENTYL) 20 MG tablet  12/16/14  Yes [provider]  docusate sodium (COLACE) 100 MG capsule Take by mouth. 03/08/15  Yes [provider]  feeding supplement, ENSURE ENLIVE, (ENSURE ENLIVE) LIQD Take 237 mLs by mouth 2 (two) times daily between meals. 10/14/16  Yes Fritzi Mandes, MD  ferrous sulfate 325 (65 FE) MG tablet Take 325 mg by mouth daily with breakfast.   Yes [provider]  gabapentin (NEURONTIN) 600 MG tablet Take 1 tablet (600 mg total) by mouth 2 (two) times daily. 07/09/17  Yes Hyatt, Max T, DPM  glipiZIDE (GLUCOTROL) 5 MG tablet Take 1 tablet (5 mg total) by mouth every morning before breakfast. 02/17/15  Yes [provider]  Glucose Blood (BLOOD GLUCOSE TEST STRIPS) STRP Use as instructed.to test BS twice daily 07/26/13  Yes [provider]  guaiFENesin (ROBITUSSIN) 100 MG/5ML liquid Take 200 mg by mouth 3 (three) times daily as needed for cough.   Yes [provider]  HARVONI 90-400 MG TABS  07/02/17  Yes [provider]  Lancets 28G MISC Use as instructed.to test BS twice daily fastclix 07/26/13  Yes [provider]  LEVEMIR FLEXTOUCH 100 UNIT/ML Pen  02/25/18  Yes [provider]  levofloxacin (LEVAQUIN) 500 MG tablet Take by mouth.  10/23/09  Yes [provider]  loperamide (IMODIUM) 2 MG capsule  11/26/17  Yes [provider]  magnesium hydroxide (MILK OF MAGNESIA) 400 MG/5ML suspension Take by mouth daily as needed for mild constipation.   Yes [provider]  metFORMIN (GLUCOPHAGE-XR) 500 MG 24 hr tablet TAKE 4 TABLETS BY MOUTH EVERY DAY 02/23/15  Yes [provider]  Multiple Vitamins-Minerals (GNP MENS MULTIPLUS PO) Take 1 tablet  by mouth daily.   Yes [provider]  OLANZapine (ZYPREXA) 5 MG tablet Take 5 mg by mouth at bedtime.   Yes [provider]  pantoprazole (PROTONIX) 40 MG tablet Take 1 tablet (40 mg total) by mouth 2 (two) times daily before a meal. 10/14/16  Yes Fritzi Mandes, MD  polyethylene glycol (MIRALAX / GLYCOLAX) packet Take 17 g by mouth daily as needed for mild constipation. 10/14/16  Yes Fritzi Mandes, MD  simvastatin (ZOCOR) 20 MG tablet Take 1 tablet (20 mg total) by mouth nightly. 03/17/15  Yes [provider]  sitaGLIPtin (JANUVIA) 100 MG tablet TAKE 1 TABLET BY MOUTH EVERY DAY 01/23/16  Yes [provider]  sucralfate (CARAFATE) 1 g tablet  02/16/18  Yes [provider]  tamsulosin (FLOMAX) 0.4 MG CAPS capsule  02/16/18  Yes [provider]  traMADol (ULTRAM) 50 MG tablet  01/05/18  Yes [provider]  triamcinolone (KENALOG) 0.025 % ointment Apply 1 application topically 2 (two) times daily. 01/22/18  Yes Gardiner Barefoot, DPM    Allergies: No Known Allergies  Social History:  reports that he has been smoking. He has never used smokeless tobacco. He reports that he drinks about 10.0 standard drinks of alcohol per week. He reports that he does not use drugs.   Family History: Family History  Problem Relation Age of Onset  . Diabetes Sister     Review of Systems: Review of Systems  Unable to perform ROS: Medical condition    Physical Exam BP 112/82   Pulse (!) 108   Temp 98.4 F (36.9 C) (Skin)   Wt 168 lb (76.2 kg)   BMI 24.81 kg/m  CONSTITUTIONAL: No acute distress HEENT:  Normocephalic, atraumatic, extraocular motion intact. NECK: Trachea is midline, and there is no jugular venous distension.  RESPIRATORY:  Lungs are clear, and breath sounds are equal bilaterally. Normal respiratory effort without pathologic use of accessory muscles. CARDIOVASCULAR: Heart is regular without murmurs, gallops, or rubs. GI: The abdomen is soft,  nondistended, nontender to palpation.  The patient does have a prior open left inguinal hernia repair scar in the left groin, but there is no recurrent hernia at this point.  There is also no significant discomfort or tenderness when palpating the left groin. GU: The patient does exhibit discomfort and some tenderness when palpating the left testicle and scrotal area. MUSCULOSKELETAL:  Normal muscle strength and tone in all four extremities.  No peripheral edema or cyanosis. SKIN: Skin turgor is normal. There are no pathologic skin lesions.  NEUROLOGIC:  Motor and sensation is grossly normal.  Cranial nerves are grossly intact. PSYCH:  Alert and oriented to person, place and time. Affect is normal.  Laboratory Analysis: Labs from 02/19/2018 show a sodium of 141, potassium 4.2, chloride 105, CO2 27, BUN 11, creatinine 0.74.  WBC 7.3, hemoglobin 12.6, hematocrit 35.9, platelet count 193.  Imaging: Patient had a CT scan on 9/12 which has been personally reviewed by me.  He does have areas of scar tissue that are consistent with postoperative changes  from his prior left inguinal hernia repair.  However there is no recurrence of a hernia.  Assessment and Plan: This is a 72 y.o. male referred to [place] for evaluation of a left inguinal hernia that does not exist on exam or on CT scan.  Reassured the patient that he does not have a left inguinal hernia or a recurrence or other issues in the left groin.  His scar is well-healed and does not appear to have any complications.  We are unclear as to when he had his hernia repair as the patient does not remember but tracking back on his chart, he has had a CT scan in 2018, and a CT scan 2017, and a CT scan 2015 all of which do not show an inguinal hernia and only shows scar tissue corresponding to a prior history of hernia repair.  However given that the pain has been on exam uncomfortable with some tenderness to the left scrotum and testicle, we will send the  referral for urology as this may be the etiology of the patient's symptoms so he can get further work-up.  Patient may follow-up with [place] on an as-needed basis.  Face-to-face time spent with the patient and care providers was 80 minutes, with more than 50% of the time spent counseling, educating, and coordinating care of the patient.     Melvyn Neth, [facility]

## 2018-02-27 NOTE — Patient Instructions (Addendum)
Patient needs to be referred to [facility] for Left testicular pain.  Please give [place] a call in case you  Have any questions or concerns.

## 2018-03-05 ENCOUNTER — Ambulatory Visit: Payer: Medicare Other | Admitting: Urology

## 2018-03-06 ENCOUNTER — Ambulatory Visit: Payer: Medicare Other | Admitting: Urology

## 2018-03-09 DIAGNOSIS — E1140 Type 2 diabetes mellitus with diabetic neuropathy, unspecified: Secondary | ICD-10-CM | POA: Diagnosis not present

## 2018-03-09 DIAGNOSIS — F319 Bipolar disorder, unspecified: Secondary | ICD-10-CM | POA: Diagnosis not present

## 2018-03-09 DIAGNOSIS — F209 Schizophrenia, unspecified: Secondary | ICD-10-CM | POA: Diagnosis not present

## 2018-03-09 DIAGNOSIS — E1121 Type 2 diabetes mellitus with diabetic nephropathy: Secondary | ICD-10-CM | POA: Diagnosis not present

## 2018-03-09 DIAGNOSIS — E785 Hyperlipidemia, unspecified: Secondary | ICD-10-CM | POA: Diagnosis not present

## 2018-03-10 ENCOUNTER — Ambulatory Visit: Payer: Medicaid Other | Admitting: General Surgery

## 2018-03-10 DIAGNOSIS — I700 Atherosclerosis of aorta: Secondary | ICD-10-CM | POA: Diagnosis not present

## 2018-03-10 DIAGNOSIS — F319 Bipolar disorder, unspecified: Secondary | ICD-10-CM | POA: Diagnosis not present

## 2018-03-10 DIAGNOSIS — F209 Schizophrenia, unspecified: Secondary | ICD-10-CM | POA: Diagnosis not present

## 2018-03-11 ENCOUNTER — Other Ambulatory Visit: Payer: Self-pay

## 2018-03-11 ENCOUNTER — Encounter: Payer: Self-pay | Admitting: Urology

## 2018-03-11 ENCOUNTER — Ambulatory Visit (INDEPENDENT_AMBULATORY_CARE_PROVIDER_SITE_OTHER): Payer: Medicare HMO | Admitting: Urology

## 2018-03-11 VITALS — BP 157/86 | HR 110 | Ht 69.0 in | Wt 169.1 lb

## 2018-03-11 DIAGNOSIS — R1032 Left lower quadrant pain: Secondary | ICD-10-CM | POA: Diagnosis not present

## 2018-03-11 DIAGNOSIS — F209 Schizophrenia, unspecified: Secondary | ICD-10-CM | POA: Diagnosis not present

## 2018-03-11 DIAGNOSIS — R1031 Right lower quadrant pain: Secondary | ICD-10-CM

## 2018-03-11 DIAGNOSIS — R399 Unspecified symptoms and signs involving the genitourinary system: Secondary | ICD-10-CM

## 2018-03-11 DIAGNOSIS — I700 Atherosclerosis of aorta: Secondary | ICD-10-CM | POA: Diagnosis not present

## 2018-03-11 DIAGNOSIS — R102 Pelvic and perineal pain: Secondary | ICD-10-CM | POA: Diagnosis not present

## 2018-03-11 DIAGNOSIS — R3129 Other microscopic hematuria: Secondary | ICD-10-CM | POA: Diagnosis not present

## 2018-03-11 DIAGNOSIS — F319 Bipolar disorder, unspecified: Secondary | ICD-10-CM | POA: Diagnosis not present

## 2018-03-11 DIAGNOSIS — R1030 Lower abdominal pain, unspecified: Secondary | ICD-10-CM | POA: Diagnosis not present

## 2018-03-11 LAB — URINALYSIS, COMPLETE
BILIRUBIN UA: NEGATIVE
GLUCOSE, UA: NEGATIVE
KETONES UA: NEGATIVE
Nitrite, UA: NEGATIVE
Specific Gravity, UA: 1.015 (ref 1.005–1.030)
Urobilinogen, Ur: 0.2 mg/dL (ref 0.2–1.0)
pH, UA: 6 (ref 5.0–7.5)

## 2018-03-11 LAB — MICROSCOPIC EXAMINATION

## 2018-03-11 MED ORDER — TAMSULOSIN HCL 0.4 MG PO CAPS: 0.8000 mg | ORAL_CAPSULE | Freq: Every day | ORAL | 1 refills | Status: DC

## 2018-03-11 NOTE — Progress Notes (Signed)
03/11/2018 10:05 AM   Lance [name], Lance [name] [id number]  Referring provider: Theotis Burrow, MD [address] [place], [place] [address]  Chief Complaint  Patient presents with  . Groin Pain    pain, some swelling in the groin area. Pt reports he's notice improvement with the swelling    HPI: 72 year old male presented to the ED on 02/19/2018 with a 2 to 3-day history of intermittent left groin pain.  He was felt to have a hernia on a prior visit to his PCP and had been referred to general surgery.  He had a CT in the ED which showed mild soft tissue thickening in the left groin but no evidence of a hernia.  He saw Dr. Hampton Abbot on 9/20 and physical exam showed no evidence of a recurrent hernia.  At that visit he was noted to have tenderness of his left testis and was referred to urology.  He complains of bilateral groin pain left greater than right.  He is also had some suprapubic discomfort.  He notes lower urinary tract symptoms of frequency, intermittent urinary stream and nocturia x2-3.  He denies dysuria or gross hematuria.  Denies previous history of urologic problems.   PMH: Past Medical History:  Diagnosis Date  . Diabetes mellitus without complication ([place])   . Hepatitis C   . Schizophrenia [facility])     Surgical History: Past Surgical History:  Procedure Laterality Date  . ESOPHAGOGASTRODUODENOSCOPY (EGD) WITH PROPOFOL N/A 10/10/2016   Procedure: ESOPHAGOGASTRODUODENOSCOPY (EGD) WITH PROPOFOL;  Surgeon: Lucilla Lame, MD;  Location: [facility] ENDOSCOPY;  Service: Endoscopy;  Laterality: N/A;  . HERNIA REPAIR    . VISCERAL ARTERY INTERVENTION N/A 10/11/2016   Procedure: Visceral Artery Intervention;  Surgeon: Algernon Huxley, MD;  Location: [place] [facility];  Service: Cardiovascular;  Laterality: N/A;    Home Medications:  Allergies as of 03/11/2018   No Known Allergies     Medication List        Accurate as of 03/11/18 10:05 AM. Always use your most recent  med list.          acetaminophen 325 MG tablet Commonly known as:  TYLENOL Take 650 mg by mouth every 4 (four) hours as needed.   albuterol 108 (90 Base) MCG/ACT inhaler Commonly known as:  PROVENTIL HFA;VENTOLIN HFA Inhale into the lungs every 6 (six) hours as needed for wheezing or shortness of breath.   atorvastatin 40 MG tablet Commonly known as:  LIPITOR Take 1 tablet (40 mg total) by mouth once daily.   BLOOD GLUCOSE TEST STRIPS Strp Use as instructed.to test BS twice daily   buprenorphine-naloxone 8-2 mg Subl SL tablet Commonly known as:  SUBOXONE Place under the tongue.   dicyclomine 20 MG tablet Commonly known as:  BENTYL   docusate sodium 100 MG capsule Commonly known as:  COLACE Take by mouth.   feeding supplement (ENSURE ENLIVE) Liqd Take 237 mLs by mouth 2 (two) times daily between meals.   ferrous sulfate 325 (65 FE) MG tablet Take 325 mg by mouth daily with breakfast.   gabapentin 600 MG tablet Commonly known as:  NEURONTIN Take 1 tablet (600 mg total) by mouth 2 (two) times daily.   glipiZIDE 5 MG tablet Commonly known as:  GLUCOTROL Take 1 tablet (5 mg total) by mouth every morning before breakfast.   GNP MENS MULTIPLUS PO Take 1 tablet by mouth daily.   guaiFENesin 100 MG/5ML liquid Commonly known as:  ROBITUSSIN Take 200  mg by mouth 3 (three) times daily as needed for cough.   HARVONI 90-400 MG Tabs Generic drug:  Ledipasvir-Sofosbuvir   JANUVIA 100 MG tablet Generic drug:  sitaGLIPtin TAKE 1 TABLET BY MOUTH EVERY DAY   Lancets 28G Misc Use as instructed.to test BS twice daily fastclix   LEVEMIR FLEXTOUCH 100 UNIT/ML Pen Generic drug:  Insulin Detemir   levofloxacin 500 MG tablet Commonly known as:  LEVAQUIN Take by mouth.   loperamide 2 MG capsule Commonly known as:  IMODIUM   magnesium hydroxide 400 MG/5ML suspension Commonly known as:  MILK OF MAGNESIA Take by mouth daily as needed for mild constipation.   metFORMIN 500  MG 24 hr tablet Commonly known as:  GLUCOPHAGE-XR TAKE 4 TABLETS BY MOUTH EVERY DAY   OLANZapine 5 MG tablet Commonly known as:  ZYPREXA Take 5 mg by mouth at bedtime.   pantoprazole 40 MG tablet Commonly known as:  PROTONIX Take 1 tablet (40 mg total) by mouth 2 (two) times daily before a meal.   polyethylene glycol packet Commonly known as:  MIRALAX / GLYCOLAX Take 17 g by mouth daily as needed for mild constipation.   simvastatin 20 MG tablet Commonly known as:  ZOCOR Take 1 tablet (20 mg total) by mouth nightly.   sucralfate 1 g tablet Commonly known as:  CARAFATE   tamsulosin 0.4 MG Caps capsule Commonly known as:  FLOMAX   traMADol 50 MG tablet Commonly known as:  ULTRAM   triamcinolone 0.025 % ointment Commonly known as:  KENALOG Apply 1 application topically 2 (two) times daily.       Allergies: No Known Allergies  Family History: Family History  Problem Relation Age of Onset  . Diabetes Sister     Social History:  reports that he has been smoking. He has been smoking about 0.25 packs per day. He has never used smokeless tobacco. He reports that he drank about 10.0 standard drinks of alcohol per week. He reports that he does not use drugs.  ROS: UROLOGY Frequent Urination?: Yes Hard to postpone urination?: No Burning/pain with urination?: No Get up at night to urinate?: Yes Leakage of urine?: No Urine stream starts and stops?: Yes Trouble starting stream?: No Do you have to strain to urinate?: No Blood in urine?: No Urinary tract infection?: Yes Sexually transmitted disease?: No Injury to kidneys or bladder?: No Painful intercourse?: No Weak stream?: No Erection problems?: Yes Penile pain?: No  Gastrointestinal Nausea?: No Vomiting?: No Indigestion/heartburn?: No Diarrhea?: No Constipation?: No  Constitutional Fever: No Night sweats?: No Weight loss?: No Fatigue?: No  Skin Skin rash/lesions?: No Itching?: No  Eyes Blurred  vision?: No Double vision?: No  Ears/Nose/Throat Sore throat?: No Sinus problems?: No  Hematologic/Lymphatic Swollen glands?: No Easy bruising?: No  Cardiovascular Leg swelling?: No Chest pain?: No  Respiratory Cough?: No Shortness of breath?: No  Endocrine Excessive thirst?: No  Musculoskeletal Back pain?: Yes Joint pain?: Yes  Neurological Headaches?: No Dizziness?: No  Psychologic Depression?: No Anxiety?: No  Physical Exam: BP (!) 157/86 (BP Location: Right Arm)   Pulse (!) 110   Ht 5\' 9"  (1.753 m)   Wt 169 lb 1.6 oz (76.7 kg)   BMI 24.97 kg/m   Constitutional:  Alert and oriented, No acute distress. HEENT: [place] AT, moist mucus membranes.  Trachea midline, no masses. Cardiovascular: No clubbing, cyanosis, or edema. Respiratory: Normal respiratory effort, no increased work of breathing. GI: Abdomen is soft, nontender, nondistended, no abdominal masses GU: No  CVA tenderness.  No groin tenderness or suprapubic tenderness.  Testes descended bilaterally without masses or tenderness.  Small left varicocele present.  Prostate 30 g, smooth with mild to moderate tenderness.  No nodules or induration. Lymph: No cervical or inguinal lymphadenopathy. Skin: No rashes, bruises or suspicious lesions. Neurologic: Grossly intact, no focal deficits, moving all 4 extremities. Psychiatric: Normal mood and affect.   Urinalysis Dipstick 1+ blood Microscopy 3-10 RBC  Pertinent Imaging: CT images personally reviewed  Results for orders placed during the hospital encounter of 02/19/18  CT Renal Stone Study   Narrative CLINICAL DATA:  Hernia left groin abdomen pain  EXAM: CT ABDOMEN AND PELVIS WITHOUT CONTRAST  TECHNIQUE: Multidetector CT imaging of the abdomen and pelvis was performed following the standard protocol without IV contrast.  COMPARISON:  CT 10/06/2016  FINDINGS: Lower chest: Lung bases demonstrate no acute consolidation or pleural effusion. Heart size  is upper normal. Trace pericardial effusion.  Hepatobiliary: No focal liver abnormality is seen. No gallstones, gallbladder wall thickening, or biliary dilatation.  Pancreas: Unremarkable. No pancreatic ductal dilatation or surrounding inflammatory changes.  Spleen: Normal in size without focal abnormality.  Adrenals/Urinary Tract: Adrenal glands are within normal limits. No hydronephrosis or ureteral stone. Cysts within the bilateral kidneys. The bladder is unremarkable.  Stomach/Bowel: Stomach is nonenlarged. Metallic coils anterior to the stomach and adjacent to the pancreas. No dilated small bowel. No bowel wall thickening. Large volume of stool in the colon. Negative appendix.  Vascular/Lymphatic: Moderate aortic atherosclerosis. No aneurysmal dilatation. No significantly enlarged lymph nodes.  Reproductive: Prostate is slightly enlarged.  Other: Negative for free air or free fluid. Mild soft tissue thickening within the left groin region without bowel containing hernia or inguinal mass.  Musculoskeletal: Degenerative changes of the spine. No acute or suspicious abnormality.  IMPRESSION: 1. Negative for hydronephrosis or ureteral stone. 2. Mild soft tissue thickening in the left groin without bowel containing hernia, enlarged lymph node or inguinal mass   Electronically Signed   By: Donavan Foil M.D.   On: 02/19/2018 15:41     Assessment & Plan:   72 year old male with bilateral groin and pelvic pain.  Genitourinary exam is unremarkable.  No testicular abnormalities appreciated.  His pain may be referred secondary to prostatic inflammation.  Increase tamsulosin to 0.8 mg daily.  Return in 4-6 weeks for symptom reassessment and cystoscopy for further evaluation of his microhematuria.   Abbie Sons, [facility] [address] [place], [place] [address] [phone]

## 2018-03-12 DIAGNOSIS — F209 Schizophrenia, unspecified: Secondary | ICD-10-CM | POA: Diagnosis not present

## 2018-03-12 DIAGNOSIS — I700 Atherosclerosis of aorta: Secondary | ICD-10-CM | POA: Diagnosis not present

## 2018-03-12 DIAGNOSIS — F319 Bipolar disorder, unspecified: Secondary | ICD-10-CM | POA: Diagnosis not present

## 2018-03-16 DIAGNOSIS — Z23 Encounter for immunization: Secondary | ICD-10-CM | POA: Diagnosis not present

## 2018-03-19 ENCOUNTER — Ambulatory Visit: Payer: Medicaid Other | Admitting: General Surgery

## 2018-03-30 ENCOUNTER — Other Ambulatory Visit: Payer: Self-pay | Admitting: Podiatry

## 2018-04-01 ENCOUNTER — Other Ambulatory Visit: Payer: Self-pay | Admitting: Podiatry

## 2018-04-08 ENCOUNTER — Other Ambulatory Visit: Payer: Medicare HMO | Admitting: Urology

## 2018-04-08 ENCOUNTER — Encounter: Payer: Self-pay | Admitting: Urology

## 2018-04-10 DIAGNOSIS — I700 Atherosclerosis of aorta: Secondary | ICD-10-CM | POA: Diagnosis not present

## 2018-04-10 DIAGNOSIS — F319 Bipolar disorder, unspecified: Secondary | ICD-10-CM | POA: Diagnosis not present

## 2018-04-10 DIAGNOSIS — F209 Schizophrenia, unspecified: Secondary | ICD-10-CM | POA: Diagnosis not present

## 2018-04-21 DIAGNOSIS — E1121 Type 2 diabetes mellitus with diabetic nephropathy: Secondary | ICD-10-CM | POA: Diagnosis not present

## 2018-04-21 DIAGNOSIS — F69 Unspecified disorder of adult personality and behavior: Secondary | ICD-10-CM | POA: Diagnosis not present

## 2018-04-21 DIAGNOSIS — E1140 Type 2 diabetes mellitus with diabetic neuropathy, unspecified: Secondary | ICD-10-CM | POA: Diagnosis not present

## 2018-04-23 ENCOUNTER — Ambulatory Visit (INDEPENDENT_AMBULATORY_CARE_PROVIDER_SITE_OTHER): Payer: Medicare HMO | Admitting: Podiatry

## 2018-04-23 ENCOUNTER — Encounter: Payer: Self-pay | Admitting: Podiatry

## 2018-04-23 ENCOUNTER — Ambulatory Visit: Payer: Medicaid Other | Admitting: Podiatry

## 2018-04-23 DIAGNOSIS — E119 Type 2 diabetes mellitus without complications: Secondary | ICD-10-CM

## 2018-04-23 DIAGNOSIS — M79674 Pain in right toe(s): Secondary | ICD-10-CM

## 2018-04-23 DIAGNOSIS — M79675 Pain in left toe(s): Secondary | ICD-10-CM

## 2018-04-23 DIAGNOSIS — B351 Tinea unguium: Secondary | ICD-10-CM | POA: Diagnosis not present

## 2018-04-23 NOTE — Progress Notes (Signed)
Complaint:  Visit Type: Patient returns to my office for continued preventative foot care services. Complaint: Patient states" my nails have grown long and thick and become painful to walk and wear shoes" Patient has been diagnosed with DM with no foot complications. The patient presents for preventative foot care services. No changes to ROS  Podiatric Exam: Vascular: dorsalis pedis and posterior tibial pulses are palpable bilateral. Capillary return is immediate. Temperature gradient is WNL. Skin turgor WNL  Sensorium: Normal Semmes Weinstein monofilament test. Normal tactile sensation bilaterally. Nail Exam: Pt has thick disfigured discolored nails with subungual debris noted bilateral entire nail hallux through fifth toenails Ulcer Exam: There is no evidence of ulcer or pre-ulcerative changes or infection. Orthopedic Exam: Muscle tone and strength are WNL. No limitations in general ROM. No crepitus or effusions noted. Foot type and digits show no abnormalities. Mild  HAV  B/L Skin: No Porokeratosis. No infection or ulcers  Diagnosis:  Onychomycosis, , Pain in right toe, pain in left toes  Treatment & Plan Procedures and Treatment: Consent by patient was obtained for treatment procedures.   Debridement of mycotic and hypertrophic toenails, 1 through 5 bilateral and clearing of subungual debris. No ulceration, no infection noted.  Return Visit-Office Procedure: Patient instructed to return to the office for a follow up visit 3 months for continued evaluation and treatment.    Gardiner Barefoot DPM

## 2018-05-04 DIAGNOSIS — F209 Schizophrenia, unspecified: Secondary | ICD-10-CM | POA: Diagnosis not present

## 2018-05-04 DIAGNOSIS — E1121 Type 2 diabetes mellitus with diabetic nephropathy: Secondary | ICD-10-CM | POA: Diagnosis not present

## 2018-05-10 DIAGNOSIS — I700 Atherosclerosis of aorta: Secondary | ICD-10-CM | POA: Diagnosis not present

## 2018-05-10 DIAGNOSIS — F319 Bipolar disorder, unspecified: Secondary | ICD-10-CM | POA: Diagnosis not present

## 2018-05-10 DIAGNOSIS — F209 Schizophrenia, unspecified: Secondary | ICD-10-CM | POA: Diagnosis not present

## 2018-05-11 DIAGNOSIS — I700 Atherosclerosis of aorta: Secondary | ICD-10-CM | POA: Diagnosis not present

## 2018-05-11 DIAGNOSIS — F319 Bipolar disorder, unspecified: Secondary | ICD-10-CM | POA: Diagnosis not present

## 2018-05-11 DIAGNOSIS — F209 Schizophrenia, unspecified: Secondary | ICD-10-CM | POA: Diagnosis not present

## 2018-05-12 DIAGNOSIS — I700 Atherosclerosis of aorta: Secondary | ICD-10-CM | POA: Diagnosis not present

## 2018-05-12 DIAGNOSIS — F209 Schizophrenia, unspecified: Secondary | ICD-10-CM | POA: Diagnosis not present

## 2018-05-12 DIAGNOSIS — F319 Bipolar disorder, unspecified: Secondary | ICD-10-CM | POA: Diagnosis not present

## 2018-05-13 DIAGNOSIS — E1121 Type 2 diabetes mellitus with diabetic nephropathy: Secondary | ICD-10-CM | POA: Diagnosis not present

## 2018-05-13 DIAGNOSIS — E785 Hyperlipidemia, unspecified: Secondary | ICD-10-CM | POA: Diagnosis not present

## 2018-05-18 ENCOUNTER — Other Ambulatory Visit: Payer: Self-pay | Admitting: Podiatry

## 2018-05-21 ENCOUNTER — Other Ambulatory Visit: Payer: Self-pay | Admitting: Family Medicine

## 2018-05-21 MED ORDER — TAMSULOSIN HCL 0.4 MG PO CAPS: 0.8000 mg | ORAL_CAPSULE | Freq: Every day | ORAL | 11 refills | Status: DC

## 2018-05-25 ENCOUNTER — Other Ambulatory Visit: Payer: Self-pay | Admitting: *Deleted

## 2018-05-25 MED ORDER — TRIAMCINOLONE ACETONIDE 0.025 % EX CREA: TOPICAL_CREAM | CUTANEOUS | 0 refills | Status: DC

## 2018-06-10 DIAGNOSIS — F209 Schizophrenia, unspecified: Secondary | ICD-10-CM | POA: Diagnosis not present

## 2018-06-10 DIAGNOSIS — F319 Bipolar disorder, unspecified: Secondary | ICD-10-CM | POA: Diagnosis not present

## 2018-06-10 DIAGNOSIS — I700 Atherosclerosis of aorta: Secondary | ICD-10-CM | POA: Diagnosis not present

## 2018-06-11 DIAGNOSIS — I700 Atherosclerosis of aorta: Secondary | ICD-10-CM | POA: Diagnosis not present

## 2018-06-11 DIAGNOSIS — F319 Bipolar disorder, unspecified: Secondary | ICD-10-CM | POA: Diagnosis not present

## 2018-06-11 DIAGNOSIS — F209 Schizophrenia, unspecified: Secondary | ICD-10-CM | POA: Diagnosis not present

## 2018-06-12 DIAGNOSIS — I700 Atherosclerosis of aorta: Secondary | ICD-10-CM | POA: Diagnosis not present

## 2018-06-12 DIAGNOSIS — F209 Schizophrenia, unspecified: Secondary | ICD-10-CM | POA: Diagnosis not present

## 2018-06-12 DIAGNOSIS — F319 Bipolar disorder, unspecified: Secondary | ICD-10-CM | POA: Diagnosis not present

## 2018-06-16 DIAGNOSIS — E1121 Type 2 diabetes mellitus with diabetic nephropathy: Secondary | ICD-10-CM | POA: Diagnosis not present

## 2018-06-19 ENCOUNTER — Ambulatory Visit (INDEPENDENT_AMBULATORY_CARE_PROVIDER_SITE_OTHER): Payer: Medicare HMO

## 2018-06-19 VITALS — BP 110/80 | HR 98 | Ht 69.0 in | Wt 174.0 lb

## 2018-06-19 DIAGNOSIS — R399 Unspecified symptoms and signs involving the genitourinary system: Secondary | ICD-10-CM | POA: Diagnosis not present

## 2018-06-19 DIAGNOSIS — R309 Painful micturition, unspecified: Secondary | ICD-10-CM | POA: Diagnosis not present

## 2018-06-19 LAB — MICROSCOPIC EXAMINATION: RBC, UA: NONE SEEN /hpf (ref 0–2)

## 2018-06-19 LAB — URINALYSIS, COMPLETE
Bilirubin, UA: NEGATIVE
Glucose, UA: NEGATIVE
Ketones, UA: NEGATIVE
Leukocytes, UA: NEGATIVE
NITRITE UA: NEGATIVE
Specific Gravity, UA: 1.025 (ref 1.005–1.030)
UUROB: 0.2 mg/dL (ref 0.2–1.0)
pH, UA: 5.5 (ref 5.0–7.5)

## 2018-06-19 LAB — BLADDER SCAN AMB NON-IMAGING

## 2018-06-19 NOTE — Progress Notes (Signed)
Patient presents today complaining of painful urination with lower abdominal pain. Symptoms present for 1-2 weeks. Patient was unable to void for urine sample. PVR 79ml. Patient instructed to wait and drink water for a few minutes. Urine left for UA and culture. Advised patient that we will call with results.

## 2018-06-23 DIAGNOSIS — Z1329 Encounter for screening for other suspected endocrine disorder: Secondary | ICD-10-CM | POA: Diagnosis not present

## 2018-06-23 DIAGNOSIS — F17209 Nicotine dependence, unspecified, with unspecified nicotine-induced disorders: Secondary | ICD-10-CM | POA: Diagnosis not present

## 2018-06-23 DIAGNOSIS — E1121 Type 2 diabetes mellitus with diabetic nephropathy: Secondary | ICD-10-CM | POA: Diagnosis not present

## 2018-06-23 DIAGNOSIS — F69 Unspecified disorder of adult personality and behavior: Secondary | ICD-10-CM | POA: Diagnosis not present

## 2018-06-23 DIAGNOSIS — N419 Inflammatory disease of prostate, unspecified: Secondary | ICD-10-CM | POA: Diagnosis not present

## 2018-06-24 ENCOUNTER — Telehealth: Payer: Self-pay

## 2018-06-24 LAB — CULTURE, URINE COMPREHENSIVE

## 2018-06-24 MED ORDER — CEPHALEXIN 500 MG PO CAPS: 500.0000 mg | ORAL_CAPSULE | Freq: Two times a day (BID) | ORAL | 0 refills | Status: DC

## 2018-06-24 NOTE — Telephone Encounter (Signed)
Left message informing patient he had a positive infection and that Keflex was sent to [facility].

## 2018-06-24 NOTE — Telephone Encounter (Signed)
-----   Message from Nori Riis, PA-C sent at 06/24/2018  1:19 PM EST ----- Please let Mr. Brandel know that his urine culture was positive for infection.  We need to start Keflex 500 mg, BID x seven days.

## 2018-07-01 ENCOUNTER — Ambulatory Visit (INDEPENDENT_AMBULATORY_CARE_PROVIDER_SITE_OTHER): Payer: Medicare HMO | Admitting: Urology

## 2018-07-01 ENCOUNTER — Encounter: Payer: Self-pay | Admitting: Urology

## 2018-07-01 VITALS — BP 90/59 | HR 121 | Ht 67.0 in | Wt 174.0 lb

## 2018-07-01 DIAGNOSIS — R3129 Other microscopic hematuria: Secondary | ICD-10-CM | POA: Diagnosis not present

## 2018-07-01 LAB — URINALYSIS, COMPLETE
Bilirubin, UA: NEGATIVE
Ketones, UA: NEGATIVE
Leukocytes, UA: NEGATIVE
NITRITE UA: NEGATIVE
Specific Gravity, UA: 1.015 (ref 1.005–1.030)
UUROB: 0.2 mg/dL (ref 0.2–1.0)
pH, UA: 6 (ref 5.0–7.5)

## 2018-07-01 LAB — MICROSCOPIC EXAMINATION: WBC, UA: NONE SEEN /hpf (ref 0–5)

## 2018-07-01 MED ORDER — LIDOCAINE HCL URETHRAL/MUCOSAL 2 % EX GEL
1.0000 "application " | Freq: Once | CUTANEOUS | Status: AC
  Administered 2018-07-01: 1 via URETHRAL

## 2018-07-01 NOTE — Patient Instructions (Signed)

## 2018-07-02 NOTE — Progress Notes (Signed)
   07/02/18  CC:  Chief Complaint  Patient presents with  . Cysto    HPI: Initially seen in the ED September 2019 for groin pain.  He was seen by general surgery and subsequently referred here as he was noted to have testis tenderness.  His exam here in October 2019 was unremarkable however he was noted to have microhematuria.  A stone protocol CT showed no significant abnormalities.  Cystoscopy was recommended. His only complaints today are diffuse lower abdominal discomfort which is mild.  He has no bothersome lower urinary tract symptoms.  Denies gross hematuria.  Blood pressure (!) 90/59, pulse (!) 121, height 5\' 7"  (1.702 m), weight 174 lb (78.9 kg). NED. A&Ox3.   No respiratory distress   Abd soft, NT, ND Normal phallus with bilateral descended testicles  Cystoscopy Procedure Note  Patient identification was confirmed, informed consent was obtained, and patient was prepped using Betadine solution.  Lidocaine jelly was administered per urethral meatus.     Pre-Procedure: - Inspection reveals a normal caliber urethral meatus.  Procedure: The flexible cystoscope was introduced without difficulty - No urethral strictures/lesions are present. - Lateral lobe enlargement with prominent hypervascularity, friability prostate  - Moderate median lobe  - Bilateral ureteral orifices identified - Bladder mucosa  reveals no ulcers, tumors, or lesions - No bladder stones -Mild trabeculation  Retroflexion shows no intravesical median lobe and backbleeding from prostate noted   Post-Procedure: - Patient tolerated the procedure well  Assessment/ Plan: Cystoscopy remarkable for BPH with prominent hypervascularity which is the most likely source of his microhematuria.  He did not have a CT urogram.  We will have him follow-up in 3 months for repeat UA.  Recommend CTU if he has persistent clinically significant microhematuria.   Abbie Sons, MD

## 2018-07-07 DIAGNOSIS — E119 Type 2 diabetes mellitus without complications: Secondary | ICD-10-CM | POA: Diagnosis not present

## 2018-07-11 DIAGNOSIS — I700 Atherosclerosis of aorta: Secondary | ICD-10-CM | POA: Diagnosis not present

## 2018-07-11 DIAGNOSIS — F319 Bipolar disorder, unspecified: Secondary | ICD-10-CM | POA: Diagnosis not present

## 2018-07-11 DIAGNOSIS — F209 Schizophrenia, unspecified: Secondary | ICD-10-CM | POA: Diagnosis not present

## 2018-07-12 DIAGNOSIS — F319 Bipolar disorder, unspecified: Secondary | ICD-10-CM | POA: Diagnosis not present

## 2018-07-12 DIAGNOSIS — F209 Schizophrenia, unspecified: Secondary | ICD-10-CM | POA: Diagnosis not present

## 2018-07-12 DIAGNOSIS — I700 Atherosclerosis of aorta: Secondary | ICD-10-CM | POA: Diagnosis not present

## 2018-07-13 DIAGNOSIS — F319 Bipolar disorder, unspecified: Secondary | ICD-10-CM | POA: Diagnosis not present

## 2018-07-13 DIAGNOSIS — F209 Schizophrenia, unspecified: Secondary | ICD-10-CM | POA: Diagnosis not present

## 2018-07-13 DIAGNOSIS — I700 Atherosclerosis of aorta: Secondary | ICD-10-CM | POA: Diagnosis not present

## 2018-07-23 ENCOUNTER — Encounter: Payer: Self-pay | Admitting: Podiatry

## 2018-07-23 ENCOUNTER — Ambulatory Visit: Payer: Medicare HMO | Admitting: Podiatry

## 2018-07-23 ENCOUNTER — Ambulatory Visit (INDEPENDENT_AMBULATORY_CARE_PROVIDER_SITE_OTHER): Payer: Medicare HMO | Admitting: Podiatry

## 2018-07-23 DIAGNOSIS — B351 Tinea unguium: Secondary | ICD-10-CM | POA: Diagnosis not present

## 2018-07-23 DIAGNOSIS — M79675 Pain in left toe(s): Secondary | ICD-10-CM

## 2018-07-23 DIAGNOSIS — E119 Type 2 diabetes mellitus without complications: Secondary | ICD-10-CM

## 2018-07-23 DIAGNOSIS — G629 Polyneuropathy, unspecified: Secondary | ICD-10-CM

## 2018-07-23 DIAGNOSIS — M79674 Pain in right toe(s): Secondary | ICD-10-CM

## 2018-07-23 NOTE — Progress Notes (Signed)
Complaint:  Visit Type: Patient returns to my office for continued preventative foot care services. Complaint: Patient states" my nails have grown long and thick and become painful to walk and wear shoes" Patient has been diagnosed with DM with no foot complications. The patient presents for preventative foot care services. No changes to ROS  Podiatric Exam: Vascular: dorsalis pedis and posterior tibial pulses are palpable bilateral. Capillary return is immediate. Temperature gradient is WNL. Skin turgor WNL  Sensorium: Normal Semmes Weinstein monofilament test. Normal tactile sensation bilaterally. Nail Exam: Pt has thick disfigured discolored nails with subungual debris noted bilateral entire nail hallux through fifth toenails Ulcer Exam: There is no evidence of ulcer or pre-ulcerative changes or infection. Orthopedic Exam: Muscle tone and strength are WNL. No limitations in general ROM. No crepitus or effusions noted. Foot type and digits show no abnormalities. Mild  HAV  B/L Skin: No Porokeratosis. No infection or ulcers  Diagnosis:  Onychomycosis, , Pain in right toe, pain in left toes  Treatment & Plan Procedures and Treatment: Consent by patient was obtained for treatment procedures.   Debridement of mycotic and hypertrophic toenails, 1 through 5 bilateral and clearing of subungual debris. No ulceration, no infection noted.  Return Visit-Office Procedure: Patient instructed to return to the office for a follow up visit 3 months for continued evaluation and treatment.    Gardiner Barefoot DPM

## 2018-08-02 DIAGNOSIS — B1920 Unspecified viral hepatitis C without hepatic coma: Secondary | ICD-10-CM

## 2018-08-09 DIAGNOSIS — I700 Atherosclerosis of aorta: Secondary | ICD-10-CM | POA: Diagnosis not present

## 2018-08-09 DIAGNOSIS — F319 Bipolar disorder, unspecified: Secondary | ICD-10-CM | POA: Diagnosis not present

## 2018-08-09 DIAGNOSIS — F209 Schizophrenia, unspecified: Secondary | ICD-10-CM | POA: Diagnosis not present

## 2018-08-10 DIAGNOSIS — F319 Bipolar disorder, unspecified: Secondary | ICD-10-CM | POA: Diagnosis not present

## 2018-08-10 DIAGNOSIS — F209 Schizophrenia, unspecified: Secondary | ICD-10-CM | POA: Diagnosis not present

## 2018-08-10 DIAGNOSIS — I700 Atherosclerosis of aorta: Secondary | ICD-10-CM | POA: Diagnosis not present

## 2018-08-11 DIAGNOSIS — I700 Atherosclerosis of aorta: Secondary | ICD-10-CM | POA: Diagnosis not present

## 2018-08-11 DIAGNOSIS — F319 Bipolar disorder, unspecified: Secondary | ICD-10-CM | POA: Diagnosis not present

## 2018-08-11 DIAGNOSIS — F209 Schizophrenia, unspecified: Secondary | ICD-10-CM | POA: Diagnosis not present

## 2018-09-02 DIAGNOSIS — F319 Bipolar disorder, unspecified: Secondary | ICD-10-CM | POA: Diagnosis not present

## 2018-09-02 DIAGNOSIS — E1121 Type 2 diabetes mellitus with diabetic nephropathy: Secondary | ICD-10-CM | POA: Diagnosis not present

## 2018-09-02 DIAGNOSIS — F209 Schizophrenia, unspecified: Secondary | ICD-10-CM | POA: Diagnosis not present

## 2018-09-15 ENCOUNTER — Other Ambulatory Visit: Payer: Self-pay | Admitting: Podiatry

## 2018-09-30 ENCOUNTER — Ambulatory Visit: Payer: Medicare HMO | Admitting: Urology

## 2018-10-04 NOTE — Progress Notes (Signed)
10/05/2018 10:56 AM   Lance [name] June 29, 1945 [id number]  Referring provider: Theotis Burrow, MD [address] [place], [place] [address]  Chief Complaint  Patient presents with  . Follow-up    HPI: Lance [name] is a 73 year old African American male with a history of hematuria and BPH with LU TS who presents today for a three month follow up.   CT Renal stone study completed on 02/19/2018 noted adrenal glands are within normal limits. No hydronephrosis or ureteral stone. Cysts within the bilateral kidneys. The bladder is unremarkable.  Prostate is slightly enlarged.  Bladder scan was 17 mL on 06/19/2018.  Cystoscopy on on 07/01/2018 with Lance [name] noted lateral lobe enlargement with prominent hypervascularity, friability prostate.  Moderate median lobe.  Mild trabeculation.    His tamsulosin was increased to 0.8 for complaints of nocturia x 2-3, frequency and intermittency.    BPH WITH LUTS  (prostate and/or bladder) IPSS score: 14/3   PVR: 73 mL  Previous PVR: 17 m  Major complaint(s): Nocturia x 3.  Bilateral side pain that radiates to the suprapubic region.  Describes the pain as burning.  8/10 pain.  Lasts for for one minute.  Nothing makes it worse.  Nothing makes it better.  States it occurs more in the morning and during sleep.  Denies constipation and diarrhea.  BM's normal.    Denies any dysuria or gross hematuria.  Denies any recent fevers, chills, nausea or vomiting.  He states both parents died of cancer, but he cannot remember what type.    He had similar complaints when he first presented to Lance [name] in 03/2018.    His UA was negative for AMH.  It was also bland without evidence of infection.    IPSS    Row Name 10/05/18 0900         International Prostate Symptom Score   How often have you had the sensation of not emptying your bladder?  Less than 1 in 5     How often have you had to urinate less than every two hours?  About half the  time     How often have you found you stopped and started again several times when you urinated?  Less than half the time     How often have you found it difficult to postpone urination?  Less than half the time     How often have you had a weak urinary stream?  Not at All     How often have you had to strain to start urination?  About half the time     How many times did you typically get up at night to urinate?  3 Times     Total IPSS Score  14       Quality of Life due to urinary symptoms   If you were to spend the rest of your life with your urinary condition just the way it is now how would you feel about that?  Mixed        Score:  1-7 Mild 8-19 Moderate 20-35 Severe  PMH: Past Medical History:  Diagnosis Date  . Diabetes mellitus without complication (Lance [name])   . Hepatitis C   . Schizophrenia [facility])     Surgical History: Past Surgical History:  Procedure Laterality Date  . ESOPHAGOGASTRODUODENOSCOPY (EGD) WITH PROPOFOL N/A 10/10/2016   Procedure: ESOPHAGOGASTRODUODENOSCOPY (EGD) WITH PROPOFOL;  Surgeon: Lance Lame, MD;  Location: [facility] ENDOSCOPY;  Service:  Endoscopy;  Laterality: N/A;  . HERNIA REPAIR    . VISCERAL ARTERY INTERVENTION N/A 10/11/2016   Procedure: Visceral Artery Intervention;  Surgeon: Lance Huxley, MD;  Location: [facility];  Service: Cardiovascular;  Laterality: N/A;    Home Medications:  Allergies as of 10/05/2018   No Known Allergies     Medication List       Accurate as of October 05, 2018 10:56 AM. Always use your most recent med list.        acetaminophen 325 MG tablet Commonly known as:  TYLENOL Take 650 mg by mouth every 4 (four) hours as needed.   albuterol 108 (90 Base) MCG/ACT inhaler Commonly known as:  VENTOLIN HFA Inhale into the lungs every 6 (six) hours as needed for wheezing or shortness of breath.   BLOOD GLUCOSE TEST STRIPS Strp Use as instructed.to test BS twice daily   buprenorphine-naloxone 8-2 mg Subl SL tablet  Commonly known as:  SUBOXONE Place under the tongue.   cephALEXin 500 MG capsule Commonly known as:  KEFLEX Take 1 capsule (500 mg total) by mouth 2 (two) times daily.   citalopram 10 MG tablet Commonly known as:  CELEXA Take 10 mg by mouth daily.   dicyclomine 20 MG tablet Commonly known as:  BENTYL   docusate sodium 100 MG capsule Commonly known as:  COLACE Take by mouth.   ferrous sulfate 325 (65 FE) MG tablet Take 325 mg by mouth daily with breakfast.   gabapentin 800 MG tablet Commonly known as:  NEURONTIN 800 mg 3 (three) times daily.   GNP MENS MULTIPLUS PO Take 1 tablet by mouth daily.   guaiFENesin 100 MG/5ML liquid Commonly known as:  ROBITUSSIN Take 200 mg by mouth 3 (three) times daily as needed for cough.   Harvoni 90-400 MG Tabs Generic drug:  Ledipasvir-Sofosbuvir   Januvia 100 MG tablet Generic drug:  sitaGLIPtin TAKE 1 TABLET BY MOUTH EVERY DAY   Lancets 28G Misc Use as instructed.to test BS twice daily fastclix   Levemir FlexTouch 100 UNIT/ML Pen Generic drug:  Insulin Detemir   loperamide 2 MG capsule Commonly known as:  IMODIUM   magnesium hydroxide 400 MG/5ML suspension Commonly known as:  MILK OF MAGNESIA Take by mouth daily as needed for mild constipation.   metFORMIN 1000 MG tablet Commonly known as:  GLUCOPHAGE 1,000 mg 2 (two) times daily with a meal.   OLANZapine 5 MG tablet Commonly known as:  ZYPREXA Take 5 mg by mouth at bedtime.   polyethylene glycol 17 g packet Commonly known as:  MIRALAX / GLYCOLAX Take 17 g by mouth daily as needed for mild constipation.   sucralfate 1 g tablet Commonly known as:  CARAFATE 1 g 4 (four) times daily.   tamsulosin 0.4 MG Caps capsule Commonly known as:  FLOMAX Take 2 capsules (0.8 mg total) by mouth daily after breakfast.   triamcinolone 0.025 % cream Commonly known as:  KENALOG APPLY TOPICALLY 2 TIMES PER DAY.  PT. NEEDS APPOINTMENT PRIOR TOPICALLY FUTUREREFILLS.   TRUEplus  Pen Needles 32G X 4 MM Misc Generic drug:  Insulin Pen Needle       Allergies: No Known Allergies  Family History: Family History  Problem Relation Age of Onset  . Diabetes Sister     Social History:  reports that he has been smoking. He has been smoking about 0.25 packs per day. He has never used smokeless tobacco. He reports previous alcohol use of about 10.0 standard  drinks of alcohol per week. He reports that he does not use drugs.  ROS: UROLOGY Frequent Urination?: No Hard to postpone urination?: No Burning/pain with urination?: No Get up at night to urinate?: No Leakage of urine?: No Urine stream starts and stops?: No Trouble starting stream?: No Do you have to strain to urinate?: No Blood in urine?: No Urinary tract infection?: No Sexually transmitted disease?: No Injury to kidneys or bladder?: No Painful intercourse?: No Weak stream?: No Erection problems?: No Penile pain?: No  Gastrointestinal Nausea?: No Vomiting?: No Indigestion/heartburn?: No Diarrhea?: No Constipation?: No  Constitutional Fever: No Night sweats?: No Weight loss?: Yes Fatigue?: No  Skin Skin rash/lesions?: No Itching?: Yes  Eyes Blurred vision?: No Double vision?: No  Ears/Nose/Throat Sore throat?: No Sinus problems?: No  Hematologic/Lymphatic Swollen glands?: No Easy bruising?: No  Cardiovascular Leg swelling?: No Chest pain?: No  Respiratory Cough?: No Shortness of breath?: No  Endocrine Excessive thirst?: No  Musculoskeletal Back pain?: No Joint pain?: No  Neurological Headaches?: No Dizziness?: No  Psychologic Depression?: No Anxiety?: No  Physical Exam: BP 121/78 (BP Location: Left Arm, Patient Position: Sitting, Cuff Size: Normal)   Pulse (!) 111   Constitutional:  Well nourished. Alert and oriented, No acute distress. HEENT: [place] AT, moist mucus membranes.  Trachea midline, no masses. Cardiovascular: No clubbing, cyanosis, or edema.  Respiratory: Normal respiratory effort, no increased work of breathing. GI: Abdomen is soft, non tender, non distended, no abdominal masses. Liver and spleen not palpable.  No hernias appreciated.  Stool sample for occult testing is not indicated.   GU: No CVA tenderness.  No bladder fullness or masses.   Patient had to leave prior to genital and prostate exam could be performed.   Neurologic: Grossly intact, no focal deficits, moving all 4 extremities. Psychiatric: Normal mood and affect.  Laboratory Data: Lab Results  Component Value Date   WBC 7.3 02/19/2018   HGB 12.6 (L) 02/19/2018   HCT 35.9 (L) 02/19/2018   MCV 89.5 02/19/2018   PLT 193 02/19/2018    Lab Results  Component Value Date   CREATININE 0.74 02/19/2018    No results found for: PSA  No results found for: TESTOSTERONE  Lab Results  Component Value Date   HGBA1C 9.0 (H) 10/10/2016    No results found for: TSH  No results found for: CHOL, HDL, CHOLHDL, VLDL, LDLCALC  Lab Results  Component Value Date   AST 47 (H) 10/10/2016   Lab Results  Component Value Date   ALT 62 10/10/2016   No components found for: ALKALINEPHOPHATASE No components found for: BILIRUBINTOTAL  No results found for: ESTRADIOL  Urinalysis Bland.  See Epic.  I have reviewed the labs.   Pertinent Imaging: Results for CLETUS, PARIS (MRN [id number]) as of 10/05/2018 10:19  Ref. Range 10/05/2018 09:25  Scan Result Unknown 41mL       Assessment & Plan:    1. History of hematuria Hematuria work up completed in 01/20202 with CT Renal study and cysto - findings positive for vascular prostate  No report of gross hematuria  UA today negative  RTC in one year for UA - patient to report any gross hematuria in the interim    2. BPH with LUTS IPSS score is 14/3 Continue conservative management, avoiding bladder irritants and timed voiding's Most bothersome symptoms is/are nocturia  Continue tamsulosin 0.8 mg daily RTC in 12  months for IPSS, PSA, PVR and exam   3. Bilateral side pain -  not of GU etiology as CT Renal stone study, cystoscopy, bladder scan and UA has not demonstrated a cause for his pain - advised to follow up with PCP   Return in about 1 year (around 10/05/2019) for IPSS, UA, PVR and exam.  These notes generated with voice recognition software. I apologize for typographical errors.  Zara Council, PA-C  [facility] [address]  [place], [place] [address] [phone]

## 2018-10-05 ENCOUNTER — Telehealth: Payer: Self-pay | Admitting: Urology

## 2018-10-05 ENCOUNTER — Ambulatory Visit (INDEPENDENT_AMBULATORY_CARE_PROVIDER_SITE_OTHER): Payer: Medicare HMO | Admitting: Urology

## 2018-10-05 ENCOUNTER — Encounter: Payer: Self-pay | Admitting: Urology

## 2018-10-05 ENCOUNTER — Other Ambulatory Visit: Payer: Self-pay

## 2018-10-05 VITALS — BP 121/78 | HR 111

## 2018-10-05 DIAGNOSIS — R109 Unspecified abdominal pain: Secondary | ICD-10-CM

## 2018-10-05 DIAGNOSIS — Z87448 Personal history of other diseases of urinary system: Secondary | ICD-10-CM | POA: Diagnosis not present

## 2018-10-05 DIAGNOSIS — N138 Other obstructive and reflux uropathy: Secondary | ICD-10-CM | POA: Diagnosis not present

## 2018-10-05 DIAGNOSIS — E1121 Type 2 diabetes mellitus with diabetic nephropathy: Secondary | ICD-10-CM | POA: Diagnosis not present

## 2018-10-05 DIAGNOSIS — N401 Enlarged prostate with lower urinary tract symptoms: Secondary | ICD-10-CM | POA: Diagnosis not present

## 2018-10-05 LAB — MICROSCOPIC EXAMINATION

## 2018-10-05 LAB — URINALYSIS, COMPLETE
Bilirubin, UA: NEGATIVE
Ketones, UA: NEGATIVE
Leukocytes,UA: NEGATIVE
Nitrite, UA: NEGATIVE
Specific Gravity, UA: 1.02 (ref 1.005–1.030)
Urobilinogen, Ur: 0.2 mg/dL (ref 0.2–1.0)
pH, UA: 5.5 (ref 5.0–7.5)

## 2018-10-05 LAB — BLADDER SCAN AMB NON-IMAGING

## 2018-10-05 NOTE — Telephone Encounter (Signed)
Please let Lance [name] know that his urine was clear.  No sign of infection.  We need to see him in one year for I PSS, PVR and exam.  Concerning his side pain, he needs to speak with his PCP as we have not identified any urological reason for his pain.

## 2018-10-05 NOTE — Patient Instructions (Signed)
Hematuria, Adult Hematuria is blood in the urine. Blood may be visible in the urine, or it may be identified with a test. This condition can be caused by infections of the bladder, urethra, kidney, or prostate. Other possible causes include:  Kidney stones.  Cancer of the urinary tract.  Too much calcium in the urine.  Conditions that are passed from parent to child (inherited conditions).  Exercise that requires a lot of energy. Infections can usually be treated with medicine, and a kidney stone usually will pass through your urine. If neither of these is the cause of your hematuria, more tests may be needed to identify the cause of your symptoms. It is very important to tell your health care provider about any blood in your urine, even if it is painless or the blood stops without treatment. Blood in the urine, when it happens and then stops and then happens again, can be a symptom of a very serious condition, including cancer. There is no pain in the initial stages of many urinary cancers. Follow these instructions at home: Medicines  Take over-the-counter and prescription medicines only as told by your health care provider.  If you were prescribed an antibiotic medicine, take it as told by your health care provider. Do not stop taking the antibiotic even if you start to feel better. Eating and drinking  Drink enough fluid to keep your urine clear or pale yellow. It is recommended that you drink 3-4 quarts (2.8-3.8 L) a day. If you have been diagnosed with an infection, it is recommended that you drink cranberry juice in addition to large amounts of water.  Avoid caffeine, tea, and carbonated beverages. These tend to irritate the bladder.  Avoid alcohol because it may irritate the prostate (men). General instructions  If you have been diagnosed with a kidney stone, follow your health care provider's instructions about straining your urine to catch the stone.  Empty your bladder  often. Avoid holding urine for long periods of time.  If you are male: ? After a bowel movement, wipe from front to back and use each piece of toilet paper only once. ? Empty your bladder before and after sex.  Pay attention to any changes in your symptoms. Tell your health care provider about any changes or any new symptoms.  It is your responsibility to get your test results. Ask your health care provider, or the department performing the test, when your results will be ready.  Keep all follow-up visits as told by your health care provider. This is important. Contact a health care provider if:  You develop back pain.  You have a fever.  You have nausea or vomiting.  Your symptoms do not improve after 3 days.  Your symptoms get worse. Get help right away if:  You develop severe vomiting and are unable take medicine without vomiting.  You develop severe pain in your back or abdomen even though you are taking medicine.  You pass a large amount of blood in your urine.  You pass blood clots in your urine.  You feel very weak or like you might faint.  You faint. Summary  Hematuria is blood in the urine. It has many possible causes.  It is very important that you tell your health care provider about any blood in your urine, even if it is painless or the blood stops without treatment.  Take over-the-counter and prescription medicines only as told by your health care provider.  Drink enough fluid to keep   your urine clear or pale yellow. This information is not intended to replace advice given to you by your health care provider. Make sure you discuss any questions you have with your health care provider. Document Released: 05/27/2005 Document Revised: 06/29/2016 Document Reviewed: 06/29/2016 Elsevier Interactive Patient Education  2019 Else[employer] Inc. Benign Prostatic Hyperplasia  Benign prostatic hyperplasia (BPH) is an enlarged prostate gland that is caused by the normal  aging process and not by cancer. The prostate is a walnut-sized gland that is involved in the production of semen. It is located in front of the rectum and below the bladder. The bladder stores urine and the urethra is the tube that carries the urine out of the body. The prostate may get bigger as a man gets older. An enlarged prostate can press on the urethra. This can make it harder to pass urine. The build-up of urine in the bladder can cause infection. Back pressure and infection may progress to bladder damage and kidney (renal) failure. What are the causes? This condition is part of a normal aging process. However, not all men develop problems from this condition. If the prostate enlarges away from the urethra, urine flow will not be blocked. If it enlarges toward the urethra and compresses it, there will be problems passing urine. What increases the risk? This condition is more likely to develop in men over the age of 50 years. What are the signs or symptoms? Symptoms of this condition include:  Getting up often during the night to urinate.  Needing to urinate frequently during the day.  Difficulty starting urine flow.  Decrease in size and strength of your urine stream.  Leaking (dribbling) after urinating.  Inability to pass urine. This needs immediate treatment.  Inability to completely empty your bladder.  Pain when you pass urine. This is more common if there is also an infection.  Urinary tract infection (UTI). How is this diagnosed? This condition is diagnosed based on your medical history, a physical exam, and your symptoms. Tests will also be done, such as:  A post-void bladder scan. This measures any amount of urine that may remain in your bladder after you finish urinating.  A digital rectal exam. In a rectal exam, your health care provider checks your prostate by putting a lubricated, gloved finger into your rectum to feel the back of your prostate gland. This exam  detects the size of your gland and any abnormal lumps or growths.  An exam of your urine (urinalysis).  A prostate specific antigen (PSA) screening. This is a blood test used to screen for prostate cancer.  An ultrasound. This test uses sound waves to electronically produce a picture of your prostate gland. Your health care provider may refer you to a specialist in kidney and prostate diseases (urologist). How is this treated? Once symptoms begin, your health care provider will monitor your condition (active surveillance or watchful waiting). Treatment for this condition will depend on the severity of your condition. Treatment may include:  Observation and yearly exams. This may be the only treatment needed if your condition and symptoms are mild.  Medicines to relieve your symptoms, including: ? Medicines to shrink the prostate. ? Medicines to relax the muscle of the prostate.  Surgery in severe cases. Surgery may include: ? Prostatectomy. In this procedure, the prostate tissue is removed completely through an open incision or with a laparascope or robotics. ? Transurethral resection of the prostate (TURP). In this procedure, a tool is inserted through the   opening at the tip of the penis (urethra). It is used to cut away tissue of the inner core of the prostate. The pieces are removed through the same opening of the penis. This removes the blockage. ? Transurethral incision (TUIP). In this procedure, small cuts are made in the prostate. This lessens the prostate's pressure on the urethra. ? Transurethral microwave thermotherapy (TUMT). This procedure uses microwaves to create heat. The heat destroys and removes a small amount of prostate tissue. ? Transurethral needle ablation (TUNA). This procedure uses radio frequencies to destroy and remove a small amount of prostate tissue. ? Interstitial laser coagulation (ILC). This procedure uses a laser to destroy and remove a small amount of prostate  tissue. ? Transurethral electrovaporization (TUVP). This procedure uses electrodes to destroy and remove a small amount of prostate tissue. ? Prostatic urethral lift. This procedure inserts an implant to push the lobes of the prostate away from the urethra. Follow these instructions at home:  Take over-the-counter and prescription medicines only as told by your health care provider.  Monitor your symptoms for any changes. Contact your health care provider with any changes.  Avoid drinking large amounts of liquid before going to bed or out in public.  Avoid or reduce how much caffeine or alcohol you drink.  Give yourself time when you urinate.  Keep all follow-up visits as told by your health care provider. This is important. Contact a health care provider if:  You have unexplained back pain.  Your symptoms do not get better with treatment.  You develop side effects from the medicine you are taking.  Your urine becomes very dark or has a bad smell.  Your lower abdomen becomes distended and you have trouble passing your urine. Get help right away if:  You have a fever or chills.  You suddenly cannot urinate.  You feel lightheaded, or very dizzy, or you faint.  There are large amounts of blood or clots in the urine.  Your urinary problems become hard to manage.  You develop moderate to severe low back or flank pain. The flank is the side of your body between the ribs and the hip. These symptoms may represent a serious problem that is an emergency. Do not wait to see if the symptoms will go away. Get medical help right away. Call your local emergency services (911 in the [place] Do not drive yourself to the hospital. Summary  Benign prostatic hyperplasia (BPH) is an enlarged prostate that is caused by the normal aging process and not by cancer.  An enlarged prostate can press on the urethra. This can make it hard to pass urine.  This condition is part of a normal aging  process and is more likely to develop in men over the age of 50 years.  Get help right away if you suddenly cannot urinate. This information is not intended to replace advice given to you by your health care provider. Make sure you discuss any questions you have with your health care provider. Document Released: 05/27/2005 Document Revised: 07/01/2016 Document Reviewed: 07/01/2016 Elsevier Interactive Patient Education  2019 Elsevier Inc.  

## 2018-10-06 NOTE — Telephone Encounter (Signed)
LMOM notifying patient of Shannon's note below. Also advised patient to return call to schedule one year follow up for I PSS, PVR and exam.

## 2018-10-22 ENCOUNTER — Other Ambulatory Visit: Payer: Self-pay

## 2018-10-22 ENCOUNTER — Ambulatory Visit (INDEPENDENT_AMBULATORY_CARE_PROVIDER_SITE_OTHER): Payer: Medicare HMO | Admitting: Podiatry

## 2018-10-22 ENCOUNTER — Encounter: Payer: Self-pay | Admitting: Podiatry

## 2018-10-22 VITALS — Temp 96.2°F

## 2018-10-22 DIAGNOSIS — M79675 Pain in left toe(s): Secondary | ICD-10-CM

## 2018-10-22 DIAGNOSIS — B351 Tinea unguium: Secondary | ICD-10-CM

## 2018-10-22 DIAGNOSIS — E1142 Type 2 diabetes mellitus with diabetic polyneuropathy: Secondary | ICD-10-CM | POA: Diagnosis not present

## 2018-10-22 DIAGNOSIS — M79674 Pain in right toe(s): Secondary | ICD-10-CM

## 2018-10-22 DIAGNOSIS — I739 Peripheral vascular disease, unspecified: Secondary | ICD-10-CM

## 2018-10-22 NOTE — Progress Notes (Signed)
Complaint:  Visit Type: Patient returns to my office for continued preventative foot care services. Complaint: Patient states" my nails have grown long and thick and become painful to walk and wear shoes" Patient has been diagnosed with DM with neuropathy and angiopathy.. The patient presents for preventative foot care services. No changes to ROS.  During my treatment he stated that he was having pain in his foot and from his ankle into his knee on both feet.  He says that the pain was present especially at night after a day's activity.  Patient states that he used to walk 2 blocks and now can only walk 1 block due to his pain.    Podiatric Exam: Vascular: dorsalis pedis and posterior tibial pulses are weakly  palpable bilateral. Capillary return is immediate. Temperature gradient is WNL. Skin turgor WNL  Sensorium: Normal Semmes Weinstein monofilament test. Normal tactile sensation bilaterally. Nail Exam: Pt has thick disfigured discolored nails with subungual debris noted bilateral entire nail hallux through fifth toenails Ulcer Exam: There is no evidence of ulcer or pre-ulcerative changes or infection. Orthopedic Exam: Muscle tone and strength are WNL. No limitations in general ROM. No crepitus or effusions noted. Foot type and digits show no abnormalities. Mild  HAV  B/L Skin: No Porokeratosis. No infection or ulcers  Diagnosis:  Onychomycosis, , Pain in right toe, pain in left toes  Diabetes with angiopathy and neuropathy  Treatment & Plan Procedures and Treatment: Consent by patient was obtained for treatment procedures.   Debridement of mycotic and hypertrophic toenails, 1 through 5 bilateral and clearing of subungual debris. No ulceration, no infection noted.  After my treatment was provided I decided to send him for a vascular consult for his diminished pulses and possible intermittent claudication.  Patient is diabetic with neuropathy and I could not determine whether his pain was from the  angiopathy or the neuropathy. Return Visit-Office Procedure: Patient instructed to return to the office for a follow up visit 3 months for continued evaluation and treatment.    Gardiner Barefoot DPM

## 2018-10-27 ENCOUNTER — Encounter (INDEPENDENT_AMBULATORY_CARE_PROVIDER_SITE_OTHER): Payer: Medicare HMO

## 2018-10-27 ENCOUNTER — Encounter (INDEPENDENT_AMBULATORY_CARE_PROVIDER_SITE_OTHER): Payer: Medicare HMO | Admitting: Vascular Surgery

## 2018-11-12 ENCOUNTER — Other Ambulatory Visit: Payer: Self-pay | Admitting: Podiatry

## 2018-11-13 ENCOUNTER — Telehealth: Payer: Self-pay | Admitting: *Deleted

## 2018-11-13 NOTE — Telephone Encounter (Signed)
I ordered refill Kenalog 0.025% cream with [facility].

## 2018-11-19 ENCOUNTER — Other Ambulatory Visit: Payer: Self-pay | Admitting: Family Medicine

## 2018-11-19 DIAGNOSIS — Z8619 Personal history of other infectious and parasitic diseases: Secondary | ICD-10-CM

## 2018-12-07 ENCOUNTER — Encounter (INDEPENDENT_AMBULATORY_CARE_PROVIDER_SITE_OTHER): Payer: Self-pay | Admitting: Vascular Surgery

## 2018-12-07 ENCOUNTER — Ambulatory Visit (INDEPENDENT_AMBULATORY_CARE_PROVIDER_SITE_OTHER): Payer: Medicare HMO

## 2018-12-07 ENCOUNTER — Other Ambulatory Visit: Payer: Self-pay

## 2018-12-07 ENCOUNTER — Ambulatory Visit (INDEPENDENT_AMBULATORY_CARE_PROVIDER_SITE_OTHER): Payer: Medicare HMO | Admitting: Vascular Surgery

## 2018-12-07 VITALS — BP 122/75 | HR 92 | Resp 16 | Ht 69.0 in | Wt 178.2 lb

## 2018-12-07 DIAGNOSIS — I739 Peripheral vascular disease, unspecified: Secondary | ICD-10-CM

## 2018-12-07 DIAGNOSIS — M79604 Pain in right leg: Secondary | ICD-10-CM | POA: Diagnosis not present

## 2018-12-07 DIAGNOSIS — M79605 Pain in left leg: Secondary | ICD-10-CM

## 2018-12-07 DIAGNOSIS — E1140 Type 2 diabetes mellitus with diabetic neuropathy, unspecified: Secondary | ICD-10-CM

## 2018-12-07 DIAGNOSIS — M79606 Pain in leg, unspecified: Secondary | ICD-10-CM | POA: Insufficient documentation

## 2018-12-07 DIAGNOSIS — F17200 Nicotine dependence, unspecified, uncomplicated: Secondary | ICD-10-CM

## 2018-12-07 DIAGNOSIS — M160 Bilateral primary osteoarthritis of hip: Secondary | ICD-10-CM

## 2018-12-07 NOTE — Progress Notes (Signed)
MRN : [id number]  Lance [name] is a 73 y.o. (08-20-1945) male who presents with chief complaint of  Chief Complaint  Patient presents with  . New Patient (Initial Visit)    ref Prudence Davidson for PVD, ABI  .  History of Present Illness:   The patient is seen for evaluation of painful lower extremities. Patient notes the pain is variable and not always associated with activity.  The pain is somewhat consistent day to day occurring on most days. The patient notes the pain also occurs with standing and routinely seems worse as the day wears on. The pain has been progressive over the past several years. The patient states these symptoms are causing  a profound negative impact on quality of life and daily activities.  The patient denies rest pain or dangling of an extremity off the side of the bed during the night for relief. No open wounds or sores at this time. No history of DVT or phlebitis. No prior interventions or surgeries.  There is a  history of back problems and DJD of the lumbar and sacral spine.   ABI's today Rt=0.95 and Lt=1.02 with triphasic signals  Current Meds  Medication Sig  . acetaminophen (TYLENOL) 325 MG tablet Take 650 mg by mouth every 4 (four) hours as needed.  Marland Kitchen albuterol (PROVENTIL HFA;VENTOLIN HFA) 108 (90 Base) MCG/ACT inhaler Inhale into the lungs every 6 (six) hours as needed for wheezing or shortness of breath.  Marland Kitchen alum & mag hydroxide-simeth (MAALOX/MYLANTA) 200-200-20 MG/5ML suspension Take by mouth every 6 (six) hours as needed for indigestion or heartburn.  . buprenorphine-naloxone (SUBOXONE) 8-2 mg SUBL SL tablet Place under the tongue.  . Carboxymethylcellulose Sodium (ARTIFICIAL TEARS OP) Apply to eye as needed.  . citalopram (CELEXA) 10 MG tablet Take 10 mg by mouth daily.   Marland Kitchen docusate sodium (COLACE) 100 MG capsule Take by mouth.  . ferrous sulfate 325 (65 FE) MG tablet Take 325 mg by mouth daily with breakfast.  . gabapentin (NEURONTIN) 800 MG tablet  800 mg 3 (three) times daily.   . Glucose Blood (BLOOD GLUCOSE TEST STRIPS) STRP Use as instructed.to test BS twice daily  . guaiFENesin (ROBITUSSIN) 100 MG/5ML liquid Take 200 mg by mouth 3 (three) times daily as needed for cough.  Marland Kitchen HARVONI 90-400 MG TABS   . Lancets 28G MISC Use as instructed.to test BS twice daily fastclix  . LEVEMIR FLEXTOUCH 100 UNIT/ML Pen 10 Units daily.   Marland Kitchen loperamide (IMODIUM) 2 MG capsule   . magnesium hydroxide (MILK OF MAGNESIA) 400 MG/5ML suspension Take by mouth daily as needed for mild constipation.  . metFORMIN (GLUCOPHAGE) 1000 MG tablet 1,000 mg 2 (two) times daily with a meal.   . Multiple Vitamins-Minerals (GNP MENS MULTIPLUS PO) Take 1 tablet by mouth daily.  Marland Kitchen Neomycin-Bacitracin-Polymyxin (TRIPLE ANTIBIOTIC) [phone] OINT Apply topically.  Marland Kitchen OLANZapine (ZYPREXA) 5 MG tablet Take 5 mg by mouth at bedtime.  . polyethylene glycol (MIRALAX / GLYCOLAX) packet Take 17 g by mouth daily as needed for mild constipation.  . sitaGLIPtin (JANUVIA) 100 MG tablet TAKE 1 TABLET BY MOUTH EVERY DAY  . sucralfate (CARAFATE) 1 g tablet 1 g 4 (four) times daily.   . tamsulosin (FLOMAX) 0.4 MG CAPS capsule Take 2 capsules (0.8 mg total) by mouth daily after breakfast.  . TRUEPLUS PEN NEEDLES 32G X 4 MM MISC     Past Medical History:  Diagnosis Date  . Diabetes mellitus without complication ([place])   .  Hepatitis C   . Schizophrenia [facility])     Past Surgical History:  Procedure Laterality Date  . ESOPHAGOGASTRODUODENOSCOPY (EGD) WITH PROPOFOL N/A 10/10/2016   Procedure: ESOPHAGOGASTRODUODENOSCOPY (EGD) WITH PROPOFOL;  Surgeon: Lucilla Lame, MD;  Location: [facility] ENDOSCOPY;  Service: Endoscopy;  Laterality: N/A;  . HERNIA REPAIR    . VISCERAL ARTERY INTERVENTION N/A 10/11/2016   Procedure: Visceral Artery Intervention;  Surgeon: Algernon Huxley, MD;  Location: [place] [facility];  Service: Cardiovascular;  Laterality: N/A;    Social History Social History   Tobacco Use   . Smoking status: Current Every Day Smoker    Packs/day: 0.25  . Smokeless tobacco: Never Used  Substance Use Topics  . Alcohol use: Not Currently    Alcohol/week: 10.0 standard drinks    Types: 10 Shots of liquor per week  . Drug use: No    Family History Family History  Problem Relation Age of Onset  . Diabetes Sister   No family history of bleeding/clotting disorders, porphyria or autoimmune disease   No Known Allergies   REVIEW OF SYSTEMS (Negative unless checked)  Constitutional: [] Weight loss  [] Fever  [] Chills Cardiac: [] Chest pain   [] Chest pressure   [] Palpitations   [] Shortness of breath when laying flat   [] Shortness of breath with exertion. Vascular:  [x] Pain in legs with walking   [x] Pain in legs at rest  [] History of DVT   [] Phlebitis   [] Swelling in legs   [] Varicose veins   [] Non-healing ulcers Pulmonary:   [] Uses home oxygen   [] Productive cough   [] Hemoptysis   [] Wheeze  [] COPD   [] Asthma Neurologic:  [] Dizziness   [] Seizures   [] History of stroke   [] History of TIA  [] Aphasia   [] Vissual changes   [] Weakness or numbness in arm   [] Weakness or numbness in leg Musculoskeletal:   [] Joint swelling   [x] Joint pain   [x] Low back pain Hematologic:  [] Easy bruising  [] Easy bleeding   [] Hypercoagulable state   [] Anemic Gastrointestinal:  [] Diarrhea   [] Vomiting  [x] Gastroesophageal reflux/heartburn   [] Difficulty swallowing. Genitourinary:  [] Chronic kidney disease   [] Difficult urination  [] Frequent urination   [] Blood in urine Skin:  [] Rashes   [] Ulcers  Psychological:  [x] History of anxiety   []  History of major depression.  Physical Examination  Vitals:   12/07/18 1043  BP: 122/75  Pulse: 92  Resp: 16  Weight: 178 lb 3.2 oz (80.8 kg)  Height: 5\' 9"  (1.753 m)   Body mass index is 26.32 kg/m. Gen: WD/WN, NAD Head: [place]/AT, No temporalis wasting.  Ear/Nose/Throat: Hearing grossly intact, nares w/o erythema or drainage, poor dentition Eyes: PER, EOMI, sclera  nonicteric.  Neck: Supple, no masses.  No bruit or JVD.  Pulmonary:  Good air movement, clear to auscultation bilaterally, no use of accessory muscles.  Cardiac: RRR, normal S1, S2, no Murmurs. Vascular:  Vessel Right Left  PT Trace Palpable Trace Palpable  DP 1+ Palpable 1+ Palpable   Gastrointestinal: soft, non-distended. No guarding/no peritoneal signs.  Musculoskeletal: M/S 5/5 throughout.  No deformity or atrophy.  Neurologic: CN 2-12 intact. Pain and light touch intact in extremities.  Symmetrical.  Speech is fluent. Motor exam as listed above. Psychiatric: Judgment intact, Mood & affect appropriate for pt's clinical situation. Dermatologic: No rashes or ulcers noted.  No changes consistent with cellulitis. Lymph : No Cervical lymphadenopathy, no lichenification or skin changes of chronic lymphedema.  CBC Lab Results  Component Value Date   WBC 7.3 02/19/2018  HGB 12.6 (L) 02/19/2018   HCT 35.9 (L) 02/19/2018   MCV 89.5 02/19/2018   PLT 193 02/19/2018    BMET    Component Value Date/Time   NA 141 02/19/2018 1309   K 4.2 02/19/2018 1309   CL 105 02/19/2018 1309   CO2 27 02/19/2018 1309   GLUCOSE 134 (H) 02/19/2018 1309   BUN 11 02/19/2018 1309   CREATININE 0.74 02/19/2018 1309   CALCIUM 9.2 02/19/2018 1309   GFRNONAA >60 02/19/2018 1309   GFRAA >60 02/19/2018 1309   CrCl cannot be calculated (Patient's most recent lab result is older than the maximum 21 days allowed.).  COAG Lab Results  Component Value Date   INR 1.38 10/10/2016    Radiology No results found.   Assessment/Plan 1. Pain in both lower extremities Recommend:  I do not find evidence of Vascular pathology that would explain the patient's symptoms  The patient has atypical pain symptoms for vascular disease  Noninvasive studies including venous ultrasound of the legs do not identify vascular problems  The patient should continue walking and begin a more formal exercise program. The  patient should continue his antiplatelet therapy and aggressive treatment of the lipid abnormalities. The patient should begin wearing graduated compression socks 15-20 mmHg strength to control her mild edema.  Further work-up of his lower extremity pain is deferred to the primary service     - VAS [place] ABI WITH/WO TBI; Future  2. PAD (peripheral artery disease) (HCC) Recommend:  I do not find evidence of life style limiting vascular disease. The patient specifically denies life style limitation.  Previous noninvasive studies including ABI's of the legs do not identify critical vascular problems.  The patient should continue walking and begin a more formal exercise program. The patient should continue his antiplatelet therapy and aggressive treatment of the lipid abnormalities.  The patient should begin wearing graduated compression socks 15-20 mmHg strength to control her mild edema.  - VAS [place] ABI WITH/WO TBI; Future  3. Type 2 diabetes, controlled, with neuropathy ([place]) Continue hypoglycemic medications as already ordered, these medications have been reviewed and there are no changes at this time.  Hgb A1C to be monitored as already arranged by primary service   4. Primary osteoarthritis of both hips Continue NSAID medications as already ordered, these medications have been reviewed and there are no changes at this time.  Continued activity and therapy was stressed.     Hortencia Pilar, MD  12/07/2018 11:26 AM

## 2018-12-14 ENCOUNTER — Telehealth: Payer: Self-pay | Admitting: Podiatry

## 2018-12-14 NOTE — Telephone Encounter (Signed)
Pt called requesting a refill on his medicated cream, Triamcinolone (Kenalog)

## 2018-12-16 NOTE — Telephone Encounter (Signed)
Called patient back, left message to return my call.

## 2019-01-05 ENCOUNTER — Other Ambulatory Visit: Payer: Self-pay | Admitting: Family Medicine

## 2019-01-05 DIAGNOSIS — Z13820 Encounter for screening for osteoporosis: Secondary | ICD-10-CM

## 2019-01-21 ENCOUNTER — Other Ambulatory Visit: Payer: Self-pay

## 2019-01-21 ENCOUNTER — Ambulatory Visit (INDEPENDENT_AMBULATORY_CARE_PROVIDER_SITE_OTHER): Payer: Medicare HMO | Admitting: Podiatry

## 2019-01-21 ENCOUNTER — Encounter: Payer: Self-pay | Admitting: Podiatry

## 2019-01-21 VITALS — Temp 97.7°F

## 2019-01-21 DIAGNOSIS — E1142 Type 2 diabetes mellitus with diabetic polyneuropathy: Secondary | ICD-10-CM | POA: Diagnosis not present

## 2019-01-21 DIAGNOSIS — I739 Peripheral vascular disease, unspecified: Secondary | ICD-10-CM | POA: Diagnosis not present

## 2019-01-21 DIAGNOSIS — M79674 Pain in right toe(s): Secondary | ICD-10-CM | POA: Diagnosis not present

## 2019-01-21 DIAGNOSIS — B351 Tinea unguium: Secondary | ICD-10-CM

## 2019-01-21 DIAGNOSIS — M79675 Pain in left toe(s): Secondary | ICD-10-CM

## 2019-01-21 MED ORDER — TRIAMCINOLONE ACETONIDE 0.025 % EX CREA: TOPICAL_CREAM | CUTANEOUS | 1 refills | Status: DC

## 2019-01-21 NOTE — Progress Notes (Addendum)
Complaint:  Visit Type: Patient returns to my office for continued preventative foot care services. Complaint: Patient states" my nails have grown long and thick and become painful to walk and wear shoes" Patient has been diagnosed with DM with no foot complications. The patient presents for preventative foot care services. No changes to ROS.  Patient says his cream is working well for his discomfort. Podiatric Exam: Vascular: dorsalis pedis and posterior tibial pulses are palpable bilateral. Capillary return is immediate. Temperature gradient is WNL. Skin turgor WNL  Sensorium: Normal Semmes Weinstein monofilament test. Normal tactile sensation bilaterally. Nail Exam: Pt has thick disfigured discolored nails with subungual debris noted bilateral entire nail hallux through fifth toenails Ulcer Exam: There is no evidence of ulcer or pre-ulcerative changes or infection. Orthopedic Exam: Muscle tone and strength are WNL. No limitations in general ROM. No crepitus or effusions noted. Foot type and digits show no abnormalities. Mild  HAV  B/L/DJD. Skin: No Porokeratosis. No infection or ulcers  Diagnosis:  Onychomycosis, , Pain in right toe, pain in left toes  Treatment & Plan Procedures and Treatment: Consent by patient was obtained for treatment procedures.   Debridement of mycotic and hypertrophic toenails, 1 through 5 bilateral and clearing of subungual debris. No ulceration, no infection noted. Prescribe kenalog 0.025% Return Visit-Office Procedure: Patient instructed to return to the office for a follow up visit 3 months for continued evaluation and treatment.    Gardiner Barefoot DPM

## 2019-01-21 NOTE — Addendum Note (Signed)
Addended by: Graceann Congress D on: 01/21/2019 10:15 AM   Modules accepted: Orders

## 2019-02-04 ENCOUNTER — Other Ambulatory Visit: Payer: Medicare HMO

## 2019-03-02 ENCOUNTER — Ambulatory Visit (INDEPENDENT_AMBULATORY_CARE_PROVIDER_SITE_OTHER): Payer: Medicare HMO | Admitting: Gastroenterology

## 2019-03-02 ENCOUNTER — Other Ambulatory Visit: Payer: Self-pay

## 2019-03-02 ENCOUNTER — Encounter: Payer: Self-pay | Admitting: Gastroenterology

## 2019-03-02 ENCOUNTER — Encounter (INDEPENDENT_AMBULATORY_CARE_PROVIDER_SITE_OTHER): Payer: Self-pay

## 2019-03-02 VITALS — BP 93/64 | HR 111 | Temp 98.2°F | Ht 69.0 in | Wt 177.2 lb

## 2019-03-02 DIAGNOSIS — B1920 Unspecified viral hepatitis C without hepatic coma: Secondary | ICD-10-CM

## 2019-03-02 NOTE — Progress Notes (Signed)
Jonathon Bellows MD, MRCP(U.K) [address]  [place], [place] [address]  Main: [phone]  Fax: [phone]   Gastroenterology Consultation  Referring Provider:     Theotis Burrow* Primary Care Physician:  Theotis Burrow, MD Primary Gastroenterologist:  Dr. Jonathon Bellows  Reason for Consultation:     Hepatitis C        HPI:   Lance [name] is a 73 y.o. y/o male referred for consultation & management  by Dr. Alene Mires, Elyse Jarvis, MD.     He has been referred for hepatitis C.  Review of labs shows that he has had a hepatitis C virus antibody positive in 2014 with PCR as well that was positive.  I do not have any recent labs.  Right upper quadrant ultrasound in April 2018 was normal. He says that he used to drink a lot of alcohol and quit a year back.  He also used to use marijuana and cocaine which he also quit many years back.  Denies any tattoos or incarceration.  Denies any Armed forces logistics/support/administrative officer.  He states that about a year back he was treated for hepatitis C with 90 days of treatment.  He cannot recollect what medication she was given but he was treated at the health center.    Past Medical History:  Diagnosis Date  . Diabetes mellitus without complication ([place])   . Hepatitis C   . Schizophrenia [facility])     Past Surgical History:  Procedure Laterality Date  . ESOPHAGOGASTRODUODENOSCOPY (EGD) WITH PROPOFOL N/A 10/10/2016   Procedure: ESOPHAGOGASTRODUODENOSCOPY (EGD) WITH PROPOFOL;  Surgeon: Lucilla Lame, MD;  Location: [facility] ENDOSCOPY;  Service: Endoscopy;  Laterality: N/A;  . HERNIA REPAIR    . VISCERAL ARTERY INTERVENTION N/A 10/11/2016   Procedure: Visceral Artery Intervention;  Surgeon: Algernon Huxley, MD;  Location: [place] [facility];  Service: Cardiovascular;  Laterality: N/A;    Prior to Admission medications   Medication Sig Start Date End Date Taking? Authorizing Provider  acetaminophen (TYLENOL) 325 MG tablet Take 650 mg by mouth every 4  (four) hours as needed.    [provider]  albuterol (PROVENTIL HFA;VENTOLIN HFA) 108 (90 Base) MCG/ACT inhaler Inhale into the lungs every 6 (six) hours as needed for wheezing or shortness of breath.    [provider]  alum & mag hydroxide-simeth (MAALOX/MYLANTA) 200-200-20 MG/5ML suspension Take by mouth every 6 (six) hours as needed for indigestion or heartburn.    [provider]  buprenorphine-naloxone (SUBOXONE) 8-2 mg SUBL SL tablet Place under the tongue.    [provider]  Carboxymethylcellulose Sodium (ARTIFICIAL TEARS OP) Apply to eye as needed.    [provider]  cephALEXin (KEFLEX) 500 MG capsule Take 1 capsule (500 mg total) by mouth 2 (two) times daily. Patient not taking: Reported on 12/07/2018 06/24/18   Zara Council A, PA-C  citalopram (CELEXA) 10 MG tablet Take 10 mg by mouth daily.  06/16/18   [provider]  dicyclomine (BENTYL) 20 MG tablet  12/16/14   [provider]  docusate sodium (COLACE) 100 MG capsule Take by mouth. 03/08/15   [provider]  ferrous sulfate 325 (65 FE) MG tablet Take 325 mg by mouth daily with breakfast.    [provider]  gabapentin (NEURONTIN) 800 MG tablet 800 mg 3 (three) times daily.  04/16/18   [provider]  Glucose Blood (BLOOD GLUCOSE TEST STRIPS) STRP Use as instructed.to test BS twice daily 07/26/13  [provider]  guaiFENesin (ROBITUSSIN) 100 MG/5ML liquid Take 200 mg by mouth 3 (three) times daily as needed for cough.    [provider]  HARVONI 90-400 MG TABS  07/02/17   [provider]  hydroxypropyl methylcellulose / hypromellose (ISOPTO TEARS / GONIOVISC) 2.5 % ophthalmic solution Place 1 drop into both eyes as needed for dry eyes.    [provider]  Lancets 28G MISC Use as instructed.to test BS twice daily fastclix 07/26/13   [provider]  LEVEMIR FLEXTOUCH 100 UNIT/ML Pen 10 Units daily.   02/25/18   [provider]  loperamide (IMODIUM) 2 MG capsule  11/26/17   [provider]  magnesium hydroxide (MILK OF MAGNESIA) 400 MG/5ML suspension Take by mouth daily as needed for mild constipation.    [provider]  metFORMIN (GLUCOPHAGE) 1000 MG tablet 1,000 mg 2 (two) times daily with a meal.  04/16/18   [provider]  Multiple Vitamins-Minerals (GNP MENS MULTIPLUS PO) Take 1 tablet by mouth daily.    [provider]  Neomycin-Bacitracin-Polymyxin (TRIPLE ANTIBIOTIC) [phone] OINT Apply topically.    [provider]  OLANZapine (ZYPREXA) 5 MG tablet Take 5 mg by mouth at bedtime.    [provider]  polyethylene glycol (MIRALAX / GLYCOLAX) packet Take 17 g by mouth daily as needed for mild constipation. 10/14/16   Fritzi Mandes, MD  sitaGLIPtin (JANUVIA) 100 MG tablet TAKE 1 TABLET BY MOUTH EVERY DAY 01/23/16   [provider]  sucralfate (CARAFATE) 1 g tablet 1 g 4 (four) times daily.  02/16/18   [provider]  tamsulosin (FLOMAX) 0.4 MG CAPS capsule Take 2 capsules (0.8 mg total) by mouth daily after breakfast. 05/21/18   Bernardo Heater, Ronda Fairly, MD  triamcinolone (KENALOG) 0.025 % cream Apply topically twice daily 01/21/19   Gardiner Barefoot, DPM  TRUEPLUS PEN NEEDLES 32G X 4 MM MISC  08/14/18   [provider]    Family History  Problem Relation Age of Onset  . Diabetes Sister      Social History   Tobacco Use  . Smoking status: Current Every Day Smoker    Packs/day: 0.25  . Smokeless tobacco: Never Used  Substance Use Topics  . Alcohol use: Not Currently    Alcohol/week: 10.0 standard drinks    Types: 10 Shots of liquor per week  . Drug use: No    Allergies as of 03/02/2019  . (No Known Allergies)    Review of Systems:    All systems reviewed and negative except where noted in HPI.   Physical Exam:  There were no vitals taken for this visit. No LMP for male patient. Psych:  Alert and  cooperative. Normal mood and affect. General:   Alert,  Well-developed, well-nourished, pleasant and cooperative in NAD Head:  Normocephalic and atraumatic. Eyes:  Sclera clear, no icterus.   Conjunctiva pink. Ears:  Normal auditory acuity. Nose:  No deformity, discharge, or lesions. Mouth:  No deformity or lesions,oropharynx pink & moist. Neck:  Supple; no masses or thyromegaly. Lungs:  Respirations even and unlabored.  Clear throughout to auscultation.   No wheezes, crackles, or rhonchi. No acute distress. Heart:  Regular rate and rhythm; no murmurs, clicks, rubs, or gallops. Abdomen:  Normal bowel sounds.  No bruits.  Soft, non-tender and non-distended without masses, hepatosplenomegaly or hernias noted.  No guarding or rebound tenderness.    Neurologic:  Alert and oriented x3;  grossly normal neurologically. Skin:  Intact  without significant lesions or rashes. No jaundice. Lymph Nodes:  No significant cervical adenopathy. Psych:  Alert and cooperative. Normal mood and affect.  Imaging Studies: No results found.  Assessment and Plan:   Lance [name] is a 73 y.o. y/o male has been referred for hepatitis C.  The labs that I can see on epic are from back in 2014.  At that point had a hepatitis C antibody that was positive as along with a positive viral load.  He mentions about being treated about a year back at the [facility] which honestly are not too sure if the treat that but I do see.  I think we should check him again to see if he does have does not have the hepatitis C.  Plan 1.  Obtain labs including CMP, hepatitis A,B and C serology.  Liver elastography   Follow up in 6 weeks  Dr Jonathon Bellows MD,MRCP(U.K)

## 2019-03-04 NOTE — Progress Notes (Signed)
Jadijah please inform hemoglobin is lower than what it has been in the past.  I do not have any other lab results to compare with.  Check iron studies B12 folate.  Noticed that his creatinine is 1.49.  I do not have any other old results to compare with.  Can you please confirm with the primary care doctor Dr. Alene Mires that he is aware of this and if it is something that is new may need to be referred to nephrology.  May have to ensure that he is not taking any NSAIDs.Dr. Vicente Males

## 2019-03-08 ENCOUNTER — Telehealth: Payer: Self-pay

## 2019-03-08 ENCOUNTER — Encounter: Payer: Self-pay | Admitting: Gastroenterology

## 2019-03-08 ENCOUNTER — Other Ambulatory Visit: Payer: Self-pay

## 2019-03-08 DIAGNOSIS — D649 Anemia, unspecified: Secondary | ICD-10-CM

## 2019-03-08 NOTE — Telephone Encounter (Signed)
Called pt to inform him of lab results and U/S appointment information and Dr. Georgeann Oppenheim instructions for pt to have additional lab test to check iron.  Unable to contact, LVM to return call

## 2019-03-08 NOTE — Telephone Encounter (Signed)
-----   Message from Jonathon Bellows, MD sent at 03/08/2019 12:41 PM EDT ----- Is the PCP aware that the creatinine is elevated ?  ----- Message ----- From: Rushie Chestnut, [place]: 03/08/2019  11:32 AM EDT To: Jonathon Bellows, MD  Dr. Vicente Males, pt PCP facility has faxed over his last labs which were on 12-17-18. Pt creatinine was 0.94. ----- Message ----- From: Jonathon Bellows, MD Sent: 03/04/2019   1:19 PM EDT To: Theotis Burrow, MD, #  Sherald Hess please inform hemoglobin is lower than what it has been in the past.  I do not have any other lab results to compare with.  Check iron studies B12 folate.  Noticed that his creatinine is 1.49.  I do not have any other old results to compare w ith.  Can you please confirm with the primary care doctor Dr. Alene Mires that he is aware of this and if it is something that is new may need to be referred to nephrology.  May have to ensure that he is not taking any NSAIDs.Dr. Vicente Males

## 2019-03-10 LAB — CBC WITH DIFFERENTIAL/PLATELET
Basophils Absolute: 0.1 10*3/uL (ref 0.0–0.2)
Basos: 1 %
EOS (ABSOLUTE): 0.2 10*3/uL (ref 0.0–0.4)
Eos: 2 %
Hematocrit: 28.2 % — ABNORMAL LOW (ref 37.5–51.0)
Hemoglobin: 9.4 g/dL — ABNORMAL LOW (ref 13.0–17.7)
Immature Grans (Abs): 0.1 10*3/uL (ref 0.0–0.1)
Immature Granulocytes: 1 %
Lymphocytes Absolute: 2.5 10*3/uL (ref 0.7–3.1)
Lymphs: 21 %
MCH: 30.1 pg (ref 26.6–33.0)
MCHC: 33.3 g/dL (ref 31.5–35.7)
MCV: 90 fL (ref 79–97)
Monocytes Absolute: 0.8 10*3/uL (ref 0.1–0.9)
Monocytes: 6 %
Neutrophils Absolute: 8.3 10*3/uL — ABNORMAL HIGH (ref 1.4–7.0)
Neutrophils: 69 %
Platelets: 241 10*3/uL (ref 150–450)
RBC: 3.12 x10E6/uL — ABNORMAL LOW (ref 4.14–5.80)
RDW: 12.9 % (ref 11.6–15.4)
WBC: 11.9 10*3/uL — ABNORMAL HIGH (ref 3.4–10.8)

## 2019-03-10 LAB — HEPATITIS B E ANTIBODY: Hep B E Ab: NEGATIVE

## 2019-03-10 LAB — PROTIME-INR
INR: 1.2 (ref 0.8–1.2)
Prothrombin Time: 12.3 s — ABNORMAL HIGH (ref 9.1–12.0)

## 2019-03-10 LAB — COMPREHENSIVE METABOLIC PANEL
ALT: 10 IU/L (ref 0–44)
AST: 13 IU/L (ref 0–40)
Albumin/Globulin Ratio: 1.3 (ref 1.2–2.2)
Albumin: 4.4 g/dL (ref 3.7–4.7)
Alkaline Phosphatase: 124 IU/L — ABNORMAL HIGH (ref 39–117)
BUN/Creatinine Ratio: 11 (ref 10–24)
BUN: 17 mg/dL (ref 8–27)
Bilirubin Total: 0.3 mg/dL (ref 0.0–1.2)
CO2: 22 mmol/L (ref 20–29)
Calcium: 9.7 mg/dL (ref 8.6–10.2)
Chloride: 102 mmol/L (ref 96–106)
Creatinine, Ser: 1.49 mg/dL — ABNORMAL HIGH (ref 0.76–1.27)
GFR calc Af Amer: 54 mL/min/{1.73_m2} — ABNORMAL LOW (ref >59)
GFR calc non Af Amer: 47 mL/min/{1.73_m2} — ABNORMAL LOW (ref >59)
Globulin, Total: 3.5 g/dL (ref 1.5–4.5)
Glucose: 147 mg/dL — ABNORMAL HIGH (ref 65–99)
Potassium: 4.5 mmol/L (ref 3.5–5.2)
Sodium: 139 mmol/L (ref 134–144)
Total Protein: 7.9 g/dL (ref 6.0–8.5)

## 2019-03-10 LAB — HEPATITIS A ANTIBODY, TOTAL: hep A Total Ab: POSITIVE — AB

## 2019-03-10 LAB — HEPATITIS B CORE ANTIBODY, TOTAL: Hep B Core Total Ab: NEGATIVE

## 2019-03-10 LAB — HEPATITIS C GENOTYPE

## 2019-03-10 LAB — HEPATITIS B SURFACE ANTIGEN: Hepatitis B Surface Ag: NEGATIVE

## 2019-03-10 LAB — HEPATITIS B E ANTIGEN: Hep B E Ag: NEGATIVE

## 2019-03-10 LAB — HIV ANTIBODY (ROUTINE TESTING W REFLEX): HIV Screen 4th Generation wRfx: NONREACTIVE

## 2019-03-10 NOTE — Telephone Encounter (Signed)
-----   Message from Jonathon Bellows, MD sent at 03/08/2019 12:41 PM EDT ----- Is the PCP aware that the creatinine is elevated ?  ----- Message ----- From: Rushie Chestnut, [place]: 03/08/2019  11:32 AM EDT To: Jonathon Bellows, MD  Dr. Vicente Males, pt PCP facility has faxed over his last labs which were on 12-17-18. Pt creatinine was 0.94. ----- Message ----- From: Jonathon Bellows, MD Sent: 03/04/2019   1:19 PM EDT To: Theotis Burrow, MD, #  Sherald Hess please inform hemoglobin is lower than what it has been in the past.  I do not have any other lab results to compare with.  Check iron studies B12 folate.  Noticed that his creatinine is 1.49.  I do not have any other old results to compare w ith.  Can you please confirm with the primary care doctor Dr. Alene Mires that he is aware of this and if it is something that is new may need to be referred to nephrology.  May have to ensure that he is not taking any NSAIDs.Dr. Vicente Males

## 2019-03-10 NOTE — Telephone Encounter (Signed)
Spoke with pt and informed him of lab results and Dr. Georgeann Oppenheim instructions for pt to have additional lab tests. I also informed pt of his U/S appointment information. Pt agrees and plans to visit lab on Friday after his U/S appointment.

## 2019-03-12 ENCOUNTER — Ambulatory Visit: Payer: Medicare HMO

## 2019-03-15 ENCOUNTER — Other Ambulatory Visit: Payer: Self-pay

## 2019-03-15 ENCOUNTER — Ambulatory Visit
Admission: RE | Admit: 2019-03-15 | Discharge: 2019-03-15 | Disposition: A | Discharge status: Home / Self Care | Payer: Medicare HMO | Source: Ambulatory Visit | Attending: Gastroenterology | Admitting: Gastroenterology

## 2019-03-15 DIAGNOSIS — B1920 Unspecified viral hepatitis C without hepatic coma: Secondary | ICD-10-CM | POA: Diagnosis present

## 2019-03-28 ENCOUNTER — Encounter: Payer: Self-pay | Admitting: Gastroenterology

## 2019-04-13 ENCOUNTER — Ambulatory Visit: Payer: Medicare HMO | Admitting: Gastroenterology

## 2019-04-13 ENCOUNTER — Encounter: Payer: Self-pay | Admitting: Gastroenterology

## 2019-04-15 ENCOUNTER — Encounter: Payer: Self-pay | Admitting: Podiatry

## 2019-04-15 ENCOUNTER — Ambulatory Visit (INDEPENDENT_AMBULATORY_CARE_PROVIDER_SITE_OTHER): Payer: Medicare HMO | Admitting: Podiatry

## 2019-04-15 ENCOUNTER — Other Ambulatory Visit: Payer: Self-pay | Admitting: *Deleted

## 2019-04-15 ENCOUNTER — Other Ambulatory Visit: Payer: Self-pay

## 2019-04-15 DIAGNOSIS — M79674 Pain in right toe(s): Secondary | ICD-10-CM

## 2019-04-15 DIAGNOSIS — E1142 Type 2 diabetes mellitus with diabetic polyneuropathy: Secondary | ICD-10-CM | POA: Diagnosis not present

## 2019-04-15 DIAGNOSIS — M79675 Pain in left toe(s): Secondary | ICD-10-CM | POA: Diagnosis not present

## 2019-04-15 DIAGNOSIS — B351 Tinea unguium: Secondary | ICD-10-CM

## 2019-04-15 MED ORDER — TRIAMCINOLONE ACETONIDE 0.025 % EX CREA: TOPICAL_CREAM | CUTANEOUS | 1 refills | Status: DC

## 2019-04-15 NOTE — Progress Notes (Addendum)
Complaint:  Visit Type: Patient returns to my office for continued preventative foot care services. Complaint: Patient states" my nails have grown long and thick and become painful to walk and wear shoes" Patient has been diagnosed with DM with no foot complications. The patient presents for preventative foot care services. No changes to ROS.  Patient says his cream is not working now for his neuropathy. Podiatric Exam: Vascular: dorsalis pedis and posterior tibial pulses are palpable bilateral. Capillary return is immediate. Temperature gradient is WNL. Skin turgor WNL  Sensorium: Normal Semmes Weinstein monofilament test. Normal tactile sensation bilaterally. Nail Exam: Pt has thick disfigured discolored nails with subungual debris noted bilateral entire nail hallux through fifth toenails Ulcer Exam: There is no evidence of ulcer or pre-ulcerative changes or infection. Orthopedic Exam: Muscle tone and strength are WNL. No limitations in general ROM. No crepitus or effusions noted. Foot type and digits show no abnormalities. Mild  HAV  B/L/DJD. Skin: No Porokeratosis. No infection or ulcers  Diagnosis:  Onychomycosis, , Pain in right toe, pain in left toes  Treatment & Plan Procedures and Treatment: Consent by patient was obtained for treatment procedures.   Debridement of mycotic and hypertrophic toenails, 1 through 5 bilateral and clearing of subungual debris. No ulceration, no infection noted. Prescribe kenalog. Return Visit-Office Procedure: Patient instructed to return to the office for a follow up visit 3 months for continued evaluation and treatment.    Gardiner Barefoot DPM

## 2019-04-19 NOTE — Telephone Encounter (Signed)
completed

## 2019-05-18 ENCOUNTER — Telehealth: Payer: Self-pay

## 2019-05-18 NOTE — Telephone Encounter (Signed)
Spoke with pt to enquire if he's still interested in following Dr. Georgeann Oppenheim recommendations. Pt states yes, he's been busy and was unable to get the additional labs collected. Pt states he'll visit the [facility] lab on Monday. Spoke with pt pcp office to follow up on pt elevated creatinine being addressed, and was told that they would be contacting pt to follow up.

## 2019-05-18 NOTE — Telephone Encounter (Signed)
-----   Message from Jonathon Bellows, MD sent at 05/11/2019 10:54 AM EST ----- Doesn't look like he followed up with any recs?  Can you check on him- if interested needs labs  ----- Message ----- From: Interface, [facility] Lab Results In Sent: 03/03/2019   7:39 AM EST To: Jonathon Bellows, MD

## 2019-06-07 ENCOUNTER — Other Ambulatory Visit: Payer: Self-pay | Admitting: Urology

## 2019-07-20 ENCOUNTER — Ambulatory Visit: Payer: Medicare HMO | Admitting: Gastroenterology

## 2019-08-03 ENCOUNTER — Encounter: Payer: Self-pay | Admitting: Gastroenterology

## 2019-08-03 ENCOUNTER — Ambulatory Visit: Payer: Medicare HMO | Admitting: Gastroenterology

## 2019-08-04 ENCOUNTER — Other Ambulatory Visit: Payer: Self-pay | Admitting: Urology

## 2019-10-21 ENCOUNTER — Telehealth: Payer: Self-pay | Admitting: Urology

## 2019-10-21 NOTE — Telephone Encounter (Signed)
Would you call Mr. Grandville Silos and have him schedule an appointment for a recheck for hematuria?

## 2019-10-25 NOTE — Telephone Encounter (Signed)
Unable to reach patient by phone. Tried home & cell numbers. Voicemail is full.

## 2019-10-28 ENCOUNTER — Other Ambulatory Visit: Payer: Self-pay

## 2019-10-28 ENCOUNTER — Emergency Department
Admission: EM | Admit: 2019-10-28 | Discharge: 2019-10-29 | Disposition: A | Discharge status: Home / Self Care | Payer: Medicare HMO | Attending: Emergency Medicine | Admitting: Emergency Medicine

## 2019-10-28 ENCOUNTER — Emergency Department: Payer: Medicare HMO

## 2019-10-28 DIAGNOSIS — L98491 Non-pressure chronic ulcer of skin of other sites limited to breakdown of skin: Secondary | ICD-10-CM

## 2019-10-28 DIAGNOSIS — E1140 Type 2 diabetes mellitus with diabetic neuropathy, unspecified: Secondary | ICD-10-CM | POA: Diagnosis not present

## 2019-10-28 DIAGNOSIS — F17210 Nicotine dependence, cigarettes, uncomplicated: Secondary | ICD-10-CM | POA: Diagnosis not present

## 2019-10-28 DIAGNOSIS — K921 Melena: Secondary | ICD-10-CM | POA: Diagnosis present

## 2019-10-28 DIAGNOSIS — K5790 Diverticulosis of intestine, part unspecified, without perforation or abscess without bleeding: Secondary | ICD-10-CM | POA: Diagnosis not present

## 2019-10-28 DIAGNOSIS — L0291 Cutaneous abscess, unspecified: Secondary | ICD-10-CM | POA: Diagnosis not present

## 2019-10-28 DIAGNOSIS — Z79899 Other long term (current) drug therapy: Secondary | ICD-10-CM | POA: Insufficient documentation

## 2019-10-28 LAB — CBC
HCT: 39.3 % (ref 39.0–52.0)
Hemoglobin: 13 g/dL (ref 13.0–17.0)
MCH: 29.9 pg (ref 26.0–34.0)
MCHC: 33.1 g/dL (ref 30.0–36.0)
MCV: 90.3 fL (ref 80.0–100.0)
Platelets: 223 10*3/uL (ref 150–400)
RBC: 4.35 MIL/uL (ref 4.22–5.81)
RDW: 12 % (ref 11.5–15.5)
WBC: 9.7 10*3/uL (ref 4.0–10.5)
nRBC: 0 % (ref 0.0–0.2)

## 2019-10-28 LAB — COMPREHENSIVE METABOLIC PANEL
ALT: 18 U/L (ref 0–44)
AST: 13 U/L — ABNORMAL LOW (ref 15–41)
Albumin: 3.9 g/dL (ref 3.5–5.0)
Alkaline Phosphatase: 108 U/L (ref 38–126)
Anion gap: 11 (ref 5–15)
BUN: 27 mg/dL — ABNORMAL HIGH (ref 8–23)
CO2: 25 mmol/L (ref 22–32)
Calcium: 9.2 mg/dL (ref 8.9–10.3)
Chloride: 99 mmol/L (ref 98–111)
Creatinine, Ser: 1.24 mg/dL (ref 0.61–1.24)
GFR calc Af Amer: >60 mL/min (ref >60)
GFR calc non Af Amer: 58 mL/min — ABNORMAL LOW (ref >60)
Glucose, Bld: 249 mg/dL — ABNORMAL HIGH (ref 70–99)
Potassium: 4.7 mmol/L (ref 3.5–5.1)
Sodium: 135 mmol/L (ref 135–145)
Total Bilirubin: 0.6 mg/dL (ref 0.3–1.2)
Total Protein: 8.1 g/dL (ref 6.5–8.1)

## 2019-10-28 LAB — LIPASE, BLOOD: Lipase: 37 U/L (ref 11–51)

## 2019-10-28 MED ORDER — SODIUM CHLORIDE 0.9% FLUSH: 3.0000 mL | Freq: Once | INTRAVENOUS | Status: DC

## 2019-10-28 MED ORDER — IOHEXOL 300 MG/ML  SOLN
100.0000 mL | Freq: Once | INTRAMUSCULAR | Status: AC | PRN
  Administered 2019-10-28: 100 mL via INTRAVENOUS

## 2019-10-28 NOTE — ED Triage Notes (Signed)
Pt comes via POV from home with c/o blood in stool for couple of days the patient states LLQ pain.  Pt denies any N/V/D

## 2019-10-29 DIAGNOSIS — K5790 Diverticulosis of intestine, part unspecified, without perforation or abscess without bleeding: Secondary | ICD-10-CM | POA: Diagnosis not present

## 2019-10-29 MED ORDER — CEPHALEXIN 500 MG PO CAPS
500.0000 mg | ORAL_CAPSULE | Freq: Two times a day (BID) | ORAL | 0 refills | Status: AC
Start: 2019-10-29 — End: 2019-11-08

## 2019-10-29 MED ORDER — CEPHALEXIN 500 MG PO CAPS
500.0000 mg | ORAL_CAPSULE | Freq: Once | ORAL | Status: AC
  Administered 2019-10-29: 500 mg via ORAL
  Filled 2019-10-29: qty 1

## 2019-10-29 MED ORDER — LIDOCAINE VISCOUS HCL 2 % MT SOLN
15.0000 mL | Freq: Once | OROMUCOSAL | Status: AC
  Administered 2019-10-29: 15 mL via OROMUCOSAL
  Filled 2019-10-29: qty 15

## 2019-10-29 NOTE — ED Provider Notes (Signed)
[facility] Emergency Department Provider Note  ____________________________________________   First MD Initiated Contact with Patient 10/28/19 2357     (approximate)  I have reviewed the triage vital signs and the nursing notes.   HISTORY  Chief Complaint blood in stool    HPI Lance [name] is a 74 y.o. male with below listed previous medical conditions presents to the emergency department with bright red blood noted when he wiped after defecation.  Patient states that he noted no blood in the toilet.  Patient admits to pain adjacent to his rectum is tender to touch.  Patient denies any fever.  Patient denies any nausea or vomiting diarrhea or constipation.         Past Medical History:  Diagnosis Date  . Diabetes mellitus without complication ([place])   . Hepatitis C   . Schizophrenia [facility])     Patient Active Problem List   Diagnosis Date Noted  . Leg pain 12/07/2018  . PAD (peripheral artery disease) ([place]) 12/07/2018  . Acute upper GI bleed 10/10/2016  . Hypotension   . Polysubstance abuse ([place]) 03/10/2015  . Mass of arm 03/10/2015  . 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 03/10/2015  . Perianal abscess 08/31/2014  . 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 Adenomatous polyp of colon 08/30/2013  . Type 2 diabetes, controlled, with neuropathy ([place]) 08/26/2013  . Chronic viral hepatitis C ([place]) 03/21/2013  . Need for hepatitis B vaccination 03/21/2013  . Depression 10/23/2012  . IVDU (intravenous drug user) 10/23/2012  . Schizophrenia ([place]) 10/23/2012  . Carpal tunnel syndrome on both sides 07/31/2012  . Diabetic peripheral neuropathy ([place]) 07/31/2012  . Osteoarthritis of both hips 07/31/2012  . Esophageal reflux 10/21/2009  . Diverticulitis of colon 10/21/2009    Past Surgical History:  Procedure Laterality Date  . ESOPHAGOGASTRODUODENOSCOPY (EGD) WITH PROPOFOL N/A 10/10/2016   Procedure: ESOPHAGOGASTRODUODENOSCOPY (EGD) WITH PROPOFOL;   Surgeon: Lucilla Lame, MD;  Location: [facility] ENDOSCOPY;  Service: Endoscopy;  Laterality: N/A;  . HERNIA REPAIR    . VISCERAL ARTERY INTERVENTION N/A 10/11/2016   Procedure: Visceral Artery Intervention;  Surgeon: Algernon Huxley, MD;  Location: [place] [facility];  Service: Cardiovascular;  Laterality: N/A;    Prior to Admission medications   Medication Sig Start Date End Date Taking? Authorizing Provider  acetaminophen (TYLENOL) 325 MG tablet Take 650 mg by mouth every 4 (four) hours as needed.    [provider]  albuterol (PROVENTIL HFA;VENTOLIN HFA) 108 (90 Base) MCG/ACT inhaler Inhale into the lungs every 6 (six) hours as needed for wheezing or shortness of breath.    [provider]  alum & mag hydroxide-simeth (MAALOX/MYLANTA) 200-200-20 MG/5ML suspension Take by mouth every 6 (six) hours as needed for indigestion or heartburn.    [provider]  buprenorphine-naloxone (SUBOXONE) 8-2 mg SUBL SL tablet Place under the tongue.    [provider]  Carboxymethylcellulose Sodium (ARTIFICIAL TEARS OP) Apply to eye as needed.    [provider]  cephALEXin (KEFLEX) 500 MG capsule Take 1 capsule (500 mg total) by mouth 2 (two) times daily. 06/24/18   Zara Council A, PA-C  citalopram (CELEXA) 10 MG tablet Take 10 mg by mouth daily.  06/16/18   [provider]  dicyclomine (BENTYL) 20 MG tablet  12/16/14   [provider]  docusate sodium (COLACE) 100 MG capsule Take by mouth. 03/08/15   [provider]  ferrous sulfate 325 (65 FE) MG tablet Take 325 mg  by mouth daily with breakfast.    [provider]  gabapentin (NEURONTIN) 800 MG tablet 800 mg 3 (three) times daily.  04/16/18   [provider]  Glucose Blood (BLOOD GLUCOSE TEST STRIPS) STRP Use as instructed.to test BS twice daily 07/26/13   [provider]  guaiFENesin (ROBITUSSIN) 100 MG/5ML liquid Take 200 mg by mouth 3 (three) times daily as needed for  cough.    [provider]  HARVONI 90-400 MG TABS  07/02/17   [provider]  hydroxypropyl methylcellulose / hypromellose (ISOPTO TEARS / GONIOVISC) 2.5 % ophthalmic solution Place 1 drop into both eyes as needed for dry eyes.    [provider]  Lancets 28G MISC Use as instructed.to test BS twice daily fastclix 07/26/13   [provider]  LEVEMIR FLEXTOUCH 100 UNIT/ML Pen 10 Units daily.  02/25/18   [provider]  loperamide (IMODIUM) 2 MG capsule  11/26/17   [provider]  magnesium hydroxide (MILK OF MAGNESIA) 400 MG/5ML suspension Take by mouth daily as needed for mild constipation.    [provider]  metFORMIN (GLUCOPHAGE) 1000 MG tablet 1,000 mg 2 (two) times daily with a meal.  04/16/18   [provider]  Multiple Vitamins-Minerals (GNP MENS MULTIPLUS PO) Take 1 tablet by mouth daily.    [provider]  Neomycin-Bacitracin-Polymyxin (TRIPLE ANTIBIOTIC) [phone] OINT Apply topically.    [provider]  OLANZapine (ZYPREXA) 5 MG tablet Take 5 mg by mouth at bedtime.    [provider]  polyethylene glycol (MIRALAX / GLYCOLAX) packet Take 17 g by mouth daily as needed for mild constipation. 10/14/16   Fritzi Mandes, MD  sitaGLIPtin (JANUVIA) 100 MG tablet TAKE 1 TABLET BY MOUTH EVERY DAY 01/23/16   [provider]  sucralfate (CARAFATE) 1 g tablet 1 g 4 (four) times daily.  02/16/18   [provider]  tamsulosin (FLOMAX) 0.4 MG CAPS capsule TAKE 2 CAPSULES BY MOUTH ONCE DAILY 08/04/19   Bernardo Heater, Ronda Fairly, MD  triamcinolone (KENALOG) 0.025 % cream Apply topically twice daily 04/15/19   Gardiner Barefoot, DPM  TRUEPLUS PEN NEEDLES 32G X 4 MM MISC  08/14/18   [provider]    Allergies Patient has no known allergies.  Family History  Problem Relation Age of Onset  . Diabetes Sister     Social History Social History   Tobacco Use  . Smoking status: Current Every Day  Smoker    Packs/day: 0.25  . Smokeless tobacco: Never Used  Substance Use Topics  . Alcohol use: Not Currently    Alcohol/week: 10.0 standard drinks    Types: 10 Shots of liquor per week  . Drug use: No    Review of Systems Constitutional: No fever/chills Eyes: No visual changes. ENT: No sore throat. Cardiovascular: Denies chest pain. Respiratory: Denies shortness of breath. Gastrointestinal: No abdominal pain.  No nausea, no vomiting.  No diarrhea.  No constipation. Genitourinary: Negative for dysuria. Musculoskeletal: Negative for neck pain.  Negative for back pain. Integumentary: Negative for rash. Neurological: Negative for headaches, focal weakness or numbness.  ____________________________________________   PHYSICAL EXAM:  VITAL SIGNS: ED Triage Vitals  Enc Vitals Group     BP 10/28/19 1706 130/85     Pulse Rate 10/28/19 1706 91     Resp 10/28/19 1706 18     Temp 10/28/19 1706 98.6 F (37 C)     Temp src --      SpO2 10/28/19 1706 100 %  Weight 10/28/19 1707 79.4 kg (175 lb)     Height 10/28/19 1707 1.753 m (5\' 9" )     Head Circumference --      Peak Flow --      Pain Score 10/28/19 1707 8     Pain Loc --      Pain Edu? --      Excl. in [place]? --     Constitutional: Alert and oriented.  Eyes: Conjunctivae are normal.  Mouth/Throat: Patient is wearing a mask. Neck: No stridor.  No meningeal signs.   Cardiovascular: Normal rate, regular rhythm. Good peripheral circulation. Grossly normal heart sounds. Respiratory: Normal respiratory effort.  No retractions. Gastrointestinal: Soft and nontender. No distention.  Genitourinary: Shallow-based ulcers exquisitely painful to touch noted in the perineal area.  No active bleeding Musculoskeletal: No lower extremity tenderness nor edema. No gross deformities of extremities. Neurologic:  Normal speech and language. No gross focal neurologic deficits are appreciated.  Skin:  Skin is warm, dry and intact. Psychiatric:  Mood and affect are normal. Speech and behavior are normal.  ____________________________________________   LABS (all labs ordered are listed, but only abnormal results are displayed)  Labs Reviewed  COMPREHENSIVE METABOLIC PANEL - Abnormal; Notable for the following components:      Result Value   Glucose, Bld 249 (*)    BUN 27 (*)    AST 13 (*)    GFR calc non Af Amer 58 (*)    All other components within normal limits  LIPASE, BLOOD  CBC  URINALYSIS, COMPLETE (UACMP) WITH MICROSCOPIC     RADIOLOGY I, Fernando Salinas N Bari Leib, personally viewed and evaluated these images (plain radiographs) as part of my medical decision making, as well as reviewing the written report by the radiologist.  ED MD interpretation: Noninflamed sigmoid diverticulosis no other acute pathology noted  Official radiology report(s): CT ABDOMEN PELVIS W CONTRAST  Result Date: 10/28/2019 CLINICAL DATA:  Left lower quadrant pain EXAM: CT ABDOMEN AND PELVIS WITH CONTRAST TECHNIQUE: Multidetector CT imaging of the abdomen and pelvis was performed using the standard protocol following bolus administration of intravenous contrast. CONTRAST:  177mL OMNIPAQUE IOHEXOL 300 MG/ML  SOLN COMPARISON:  February 19, 2018 FINDINGS: Lower chest: No acute abnormality. Hepatobiliary: No focal liver abnormality is seen. No gallstones, gallbladder wall thickening, or biliary dilatation. Pancreas: Unremarkable. No pancreatic ductal dilatation or surrounding inflammatory changes. Metallic density surgical material is seen anterior to the junction of the body and head of the pancreas and anterior to the gastric antrum. Spleen: Normal in size without focal abnormality. Adrenals/Urinary Tract: Adrenal glands are unremarkable. Kidneys are normal in size, without renal calculi or hydronephrosis. Stable bilateral renal cysts are seen. Bladder is unremarkable. Stomach/Bowel: Stomach is within normal limits. Appendix appears normal. No evidence of  bowel wall thickening, distention, or inflammatory changes. Noninflamed diverticula are seen within the sigmoid colon. Vascular/Lymphatic: There is marked severity calcification of the abdominal aorta. No enlarged abdominal or pelvic lymph nodes. Reproductive: The prostate gland is moderately enlarged. Other: No abdominal wall hernia or abnormality. No abdominopelvic ascites. Musculoskeletal: Degenerative changes seen throughout the lumbar spine. This is most prominent at the levels of L3-L4 and L4-L5. IMPRESSION: 1. Noninflamed sigmoid diverticulosis. 2. Stable bilateral renal cysts. 3. Aortic atherosclerosis. 4. Enlarged prostate gland Aortic Atherosclerosis (ICD10-I70.0). Electronically Signed   By: Virgina Norfolk M.D.   On: 10/28/2019 23:29    ____________________________________________   PROCEDURES   Procedure(s) performed (including Critical Care):  Procedures   ____________________________________________  INITIAL IMPRESSION / MDM / ASSESSMENT AND PLAN / ED COURSE  As part of my medical decision making, I reviewed the following data within the electronic MEDICAL RECORD NUMBER  74 year old male presented with above-stated history and physical exam secondary to bright red blood per rectum when he wipes.  2 distinct shallow-based ulcers noted grossly tender to touch which may be the etiology of the patient's bleeding.  In addition consider the possibility of diverticulosis diverticulitis as the patient has history of the same.  CT scan revealed evidence of diverticulosis however no evidence of diverticulitis.  Patient states that the area of the ulcer is his location for his discomfort and as such I suspect this to be the etiology.  Consider the possibility of genital herpes patient denies any new sexual partner.  However HSV lab test obtained.  Patient given Keflex and will be prescribed the same for home.  Viscous lidocaine applied to the area with resolution of  pain.  ____________________________________________  FINAL CLINICAL IMPRESSION(S) / ED DIAGNOSES  Final diagnoses:  Diverticulosis  Ulcer of perianal area, limited to breakdown of skin (HCC)  Abscess     MEDICATIONS GIVEN DURING THIS VISIT:  Medications  sodium chloride flush (NS) 0.9 % injection 3 mL (has no administration in time range)  cephALEXin (KEFLEX) capsule 500 mg (has no administration in time range)  lidocaine (XYLOCAINE) 2 % viscous mouth solution 15 mL (has no administration in time range)  iohexol (OMNIPAQUE) 300 MG/ML solution 100 mL (100 mLs Intravenous Contrast Given 10/28/19 2302)     ED Discharge Orders    None      *Please note:  KELLEE KNEECE was evaluated in Emergency Department on 10/29/2019 for the symptoms described in the history of present illness. He was evaluated in the context of the global COVID-19 pandemic, which necessitated consideration that the patient might be at risk for infection with the SARS-CoV-2 virus that causes COVID-19. Institutional protocols and algorithms that pertain to the evaluation of patients at risk for COVID-19 are in a state of rapid change based on information released by regulatory bodies including the CDC and federal and state organizations. These policies and algorithms were followed during the patient's care in the ED.  Some ED evaluations and interventions may be delayed as a result of limited staffing during the pandemic.*  Note:  This document was prepared using Dragon voice recognition software and may include unintentional dictation errors.   Gregor Hams, MD 10/29/19 [phone]

## 2019-10-30 LAB — HSV 2 ANTIBODY, IGG: HSV 2 Glycoprotein G Ab, IgG: 12.2 index — ABNORMAL HIGH (ref 0.00–0.90)

## 2019-11-01 ENCOUNTER — Telehealth: Payer: Self-pay | Admitting: Emergency Medicine

## 2019-11-01 NOTE — Telephone Encounter (Signed)
Called patient to give result of hsv test.  Per dr Corky Downs no change in treatment, but patient needs to follow up with his doctor.  Left a message asking him to call me.

## 2019-11-09 NOTE — Telephone Encounter (Signed)
Attempted to contact patient again to inform of hsv and need to follow up with pcp.  Left message again.

## 2019-11-10 ENCOUNTER — Encounter: Payer: Self-pay | Admitting: Urology

## 2019-11-18 ENCOUNTER — Encounter: Payer: Self-pay | Admitting: Urology

## 2019-12-03 ENCOUNTER — Encounter: Payer: Self-pay | Admitting: *Deleted

## 2019-12-09 ENCOUNTER — Ambulatory Visit (INDEPENDENT_AMBULATORY_CARE_PROVIDER_SITE_OTHER): Payer: Medicare HMO | Admitting: Vascular Surgery

## 2019-12-09 ENCOUNTER — Encounter (INDEPENDENT_AMBULATORY_CARE_PROVIDER_SITE_OTHER): Payer: Medicare HMO

## 2020-05-14 ENCOUNTER — Emergency Department: Payer: Medicare HMO

## 2020-05-14 ENCOUNTER — Inpatient Hospital Stay
Admission: EM | Admit: 2020-05-14 | Discharge: 2020-06-15 | DRG: 545 | Disposition: A | Discharge status: Home Health | Payer: Medicare HMO | Attending: Internal Medicine | Admitting: Internal Medicine

## 2020-05-14 ENCOUNTER — Other Ambulatory Visit: Payer: Self-pay

## 2020-05-14 DIAGNOSIS — F17210 Nicotine dependence, cigarettes, uncomplicated: Secondary | ICD-10-CM | POA: Diagnosis present

## 2020-05-14 DIAGNOSIS — A419 Sepsis, unspecified organism: Secondary | ICD-10-CM | POA: Diagnosis present

## 2020-05-14 DIAGNOSIS — E119 Type 2 diabetes mellitus without complications: Secondary | ICD-10-CM

## 2020-05-14 DIAGNOSIS — D649 Anemia, unspecified: Secondary | ICD-10-CM | POA: Diagnosis present

## 2020-05-14 DIAGNOSIS — Z794 Long term (current) use of insulin: Secondary | ICD-10-CM

## 2020-05-14 DIAGNOSIS — G936 Cerebral edema: Secondary | ICD-10-CM | POA: Diagnosis present

## 2020-05-14 DIAGNOSIS — E1165 Type 2 diabetes mellitus with hyperglycemia: Secondary | ICD-10-CM

## 2020-05-14 DIAGNOSIS — F1410 Cocaine abuse, uncomplicated: Secondary | ICD-10-CM | POA: Diagnosis present

## 2020-05-14 DIAGNOSIS — Z1389 Encounter for screening for other disorder: Secondary | ICD-10-CM

## 2020-05-14 DIAGNOSIS — R55 Syncope and collapse: Secondary | ICD-10-CM | POA: Diagnosis not present

## 2020-05-14 DIAGNOSIS — N400 Enlarged prostate without lower urinary tract symptoms: Secondary | ICD-10-CM | POA: Diagnosis present

## 2020-05-14 DIAGNOSIS — K5900 Constipation, unspecified: Secondary | ICD-10-CM

## 2020-05-14 DIAGNOSIS — E872 Acidosis, unspecified: Secondary | ICD-10-CM

## 2020-05-14 DIAGNOSIS — R7989 Other specified abnormal findings of blood chemistry: Secondary | ICD-10-CM

## 2020-05-14 DIAGNOSIS — Z0189 Encounter for other specified special examinations: Secondary | ICD-10-CM

## 2020-05-14 DIAGNOSIS — N390 Urinary tract infection, site not specified: Secondary | ICD-10-CM

## 2020-05-14 DIAGNOSIS — Z5320 Procedure and treatment not carried out because of patient's decision for unspecified reasons: Secondary | ICD-10-CM

## 2020-05-14 DIAGNOSIS — G894 Chronic pain syndrome: Secondary | ICD-10-CM | POA: Diagnosis present

## 2020-05-14 DIAGNOSIS — Z20822 Contact with and (suspected) exposure to covid-19: Secondary | ICD-10-CM | POA: Diagnosis present

## 2020-05-14 DIAGNOSIS — I680 Cerebral amyloid angiopathy: Secondary | ICD-10-CM | POA: Diagnosis present

## 2020-05-14 DIAGNOSIS — E876 Hypokalemia: Secondary | ICD-10-CM | POA: Diagnosis not present

## 2020-05-14 DIAGNOSIS — Z21 Asymptomatic human immunodeficiency virus [HIV] infection status: Secondary | ICD-10-CM | POA: Diagnosis present

## 2020-05-14 DIAGNOSIS — Z79899 Other long term (current) drug therapy: Secondary | ICD-10-CM

## 2020-05-14 DIAGNOSIS — I618 Other nontraumatic intracerebral hemorrhage: Secondary | ICD-10-CM

## 2020-05-14 DIAGNOSIS — F1490 Cocaine use, unspecified, uncomplicated: Secondary | ICD-10-CM

## 2020-05-14 DIAGNOSIS — R0902 Hypoxemia: Secondary | ICD-10-CM | POA: Diagnosis not present

## 2020-05-14 DIAGNOSIS — R410 Disorientation, unspecified: Secondary | ICD-10-CM

## 2020-05-14 DIAGNOSIS — S065X9A Traumatic subdural hemorrhage with loss of consciousness of unspecified duration, initial encounter: Secondary | ICD-10-CM | POA: Diagnosis present

## 2020-05-14 DIAGNOSIS — D320 Benign neoplasm of cerebral meninges: Secondary | ICD-10-CM | POA: Diagnosis present

## 2020-05-14 DIAGNOSIS — E854 Organ-limited amyloidosis: Secondary | ICD-10-CM | POA: Diagnosis not present

## 2020-05-14 DIAGNOSIS — B1920 Unspecified viral hepatitis C without hepatic coma: Secondary | ICD-10-CM

## 2020-05-14 DIAGNOSIS — Z7984 Long term (current) use of oral hypoglycemic drugs: Secondary | ICD-10-CM

## 2020-05-14 DIAGNOSIS — Z833 Family history of diabetes mellitus: Secondary | ICD-10-CM

## 2020-05-14 DIAGNOSIS — G9341 Metabolic encephalopathy: Secondary | ICD-10-CM

## 2020-05-14 DIAGNOSIS — R4182 Altered mental status, unspecified: Secondary | ICD-10-CM

## 2020-05-14 DIAGNOSIS — W19XXXA Unspecified fall, initial encounter: Secondary | ICD-10-CM | POA: Diagnosis present

## 2020-05-14 DIAGNOSIS — R531 Weakness: Secondary | ICD-10-CM

## 2020-05-14 DIAGNOSIS — E440 Moderate protein-calorie malnutrition: Secondary | ICD-10-CM | POA: Insufficient documentation

## 2020-05-14 DIAGNOSIS — B9689 Other specified bacterial agents as the cause of diseases classified elsewhere: Secondary | ICD-10-CM | POA: Diagnosis present

## 2020-05-14 LAB — BLOOD GAS, ARTERIAL
Acid-Base Excess: 1 mmol/L (ref 0.0–2.0)
Bicarbonate: 23.4 mmol/L (ref 20.0–28.0)
FIO2: 0.21
O2 Saturation: 99.5 %
Patient temperature: 37
pCO2 arterial: 30 mmHg — ABNORMAL LOW (ref 32.0–48.0)
pH, Arterial: 7.5 — ABNORMAL HIGH (ref 7.350–7.450)
pO2, Arterial: 151 mmHg — ABNORMAL HIGH (ref 83.0–108.0)

## 2020-05-14 LAB — COMPREHENSIVE METABOLIC PANEL
ALT: 15 U/L (ref 0–44)
AST: 21 U/L (ref 15–41)
Albumin: 3.6 g/dL (ref 3.5–5.0)
Alkaline Phosphatase: 120 U/L (ref 38–126)
Anion gap: 9 (ref 5–15)
BUN: 12 mg/dL (ref 8–23)
CO2: 26 mmol/L (ref 22–32)
Calcium: 9.3 mg/dL (ref 8.9–10.3)
Chloride: 103 mmol/L (ref 98–111)
Creatinine, Ser: 1.16 mg/dL (ref 0.61–1.24)
GFR, Estimated: >60 mL/min (ref >60)
Glucose, Bld: 381 mg/dL — ABNORMAL HIGH (ref 70–99)
Potassium: 4.1 mmol/L (ref 3.5–5.1)
Sodium: 138 mmol/L (ref 135–145)
Total Bilirubin: 0.7 mg/dL (ref 0.3–1.2)
Total Protein: 7.9 g/dL (ref 6.5–8.1)

## 2020-05-14 LAB — URINE DRUG SCREEN, QUALITATIVE (ARMC ONLY)
Amphetamines, Ur Screen: NOT DETECTED
Barbiturates, Ur Screen: NOT DETECTED
Benzodiazepine, Ur Scrn: NOT DETECTED
Cannabinoid 50 Ng, Ur ~~LOC~~: NOT DETECTED
Cocaine Metabolite,Ur ~~LOC~~: POSITIVE — AB
MDMA (Ecstasy)Ur Screen: NOT DETECTED
Methadone Scn, Ur: NOT DETECTED
Opiate, Ur Screen: NOT DETECTED
Phencyclidine (PCP) Ur S: NOT DETECTED
Tricyclic, Ur Screen: NOT DETECTED

## 2020-05-14 LAB — URINALYSIS, COMPLETE (UACMP) WITH MICROSCOPIC
Bacteria, UA: NONE SEEN
Bilirubin Urine: NEGATIVE
Glucose, UA: >=500 mg/dL — AB
Ketones, ur: NEGATIVE mg/dL
Nitrite: NEGATIVE
Protein, ur: 30 mg/dL — AB
Specific Gravity, Urine: 1.028 (ref 1.005–1.030)
pH: 5 (ref 5.0–8.0)

## 2020-05-14 LAB — CBC WITH DIFFERENTIAL/PLATELET
Abs Immature Granulocytes: 0.08 10*3/uL — ABNORMAL HIGH (ref 0.00–0.07)
Basophils Absolute: 0 10*3/uL (ref 0.0–0.1)
Basophils Relative: 0 %
Eosinophils Absolute: 0 10*3/uL (ref 0.0–0.5)
Eosinophils Relative: 0 %
HCT: 40.3 % (ref 39.0–52.0)
Hemoglobin: 13.8 g/dL (ref 13.0–17.0)
Immature Granulocytes: 1 %
Lymphocytes Relative: 7 %
Lymphs Abs: 1.1 10*3/uL (ref 0.7–4.0)
MCH: 30.4 pg (ref 26.0–34.0)
MCHC: 34.2 g/dL (ref 30.0–36.0)
MCV: 88.8 fL (ref 80.0–100.0)
Monocytes Absolute: 0.7 10*3/uL (ref 0.1–1.0)
Monocytes Relative: 4 %
Neutro Abs: 13.2 10*3/uL — ABNORMAL HIGH (ref 1.7–7.7)
Neutrophils Relative %: 88 %
Platelets: 215 10*3/uL (ref 150–400)
RBC: 4.54 MIL/uL (ref 4.22–5.81)
RDW: 12.9 % (ref 11.5–15.5)
WBC: 15.2 10*3/uL — ABNORMAL HIGH (ref 4.0–10.5)
nRBC: 0 % (ref 0.0–0.2)

## 2020-05-14 LAB — RESP PANEL BY RT-PCR (FLU A&B, COVID) ARPGX2
Influenza A by PCR: NEGATIVE
Influenza B by PCR: NEGATIVE
SARS Coronavirus 2 by RT PCR: NEGATIVE

## 2020-05-14 LAB — CK: Total CK: 548 U/L — ABNORMAL HIGH (ref 49–397)

## 2020-05-14 LAB — ETHANOL: Alcohol, Ethyl (B): <10 mg/dL (ref <10)

## 2020-05-14 LAB — TSH: TSH: 0.957 u[IU]/mL (ref 0.350–4.500)

## 2020-05-14 LAB — AMMONIA: Ammonia: 40 umol/L — ABNORMAL HIGH (ref 9–35)

## 2020-05-14 LAB — PROTIME-INR
INR: 1 (ref 0.8–1.2)
Prothrombin Time: 13.1 seconds (ref 11.4–15.2)

## 2020-05-14 LAB — CBG MONITORING, ED: Glucose-Capillary: 329 mg/dL — ABNORMAL HIGH (ref 70–99)

## 2020-05-14 LAB — APTT: aPTT: 27 seconds (ref 24–36)

## 2020-05-14 LAB — LACTIC ACID, PLASMA
Lactic Acid, Venous: 3.1 mmol/L (ref 0.5–1.9)
Lactic Acid, Venous: 1.9 mmol/L (ref 0.5–1.9)

## 2020-05-14 LAB — TROPONIN I (HIGH SENSITIVITY): Troponin I (High Sensitivity): 11 ng/L (ref <18)

## 2020-05-14 MED ORDER — TAMSULOSIN HCL 0.4 MG PO CAPS
0.8000 mg | ORAL_CAPSULE | Freq: Every day | ORAL | Status: DC
  Administered 2020-05-15 – 2020-06-15 (×31): 0.8 mg via ORAL
  Filled 2020-05-14 (×32): qty 2

## 2020-05-14 MED ORDER — ACETAMINOPHEN 650 MG RE SUPP: 325.0000 mg | Freq: Four times a day (QID) | RECTAL | Status: DC | PRN

## 2020-05-14 MED ORDER — VANCOMYCIN HCL IN DEXTROSE 1-5 GM/200ML-% IV SOLN
1000.0000 mg | Freq: Once | INTRAVENOUS | Status: AC
  Administered 2020-05-15: 1000 mg via INTRAVENOUS
  Filled 2020-05-14: qty 200

## 2020-05-14 MED ORDER — VANCOMYCIN HCL 750 MG/150ML IV SOLN
750.0000 mg | Freq: Two times a day (BID) | INTRAVENOUS | Status: DC
  Administered 2020-05-15 – 2020-05-16 (×3): 750 mg via INTRAVENOUS
  Filled 2020-05-14 (×7): qty 150

## 2020-05-14 MED ORDER — VANCOMYCIN HCL IN DEXTROSE 1-5 GM/200ML-% IV SOLN: 1000.0000 mg | Freq: Once | INTRAVENOUS | Status: DC

## 2020-05-14 MED ORDER — OLANZAPINE 5 MG PO TABS
5.0000 mg | ORAL_TABLET | Freq: Every day | ORAL | Status: DC
  Administered 2020-05-15 – 2020-05-18 (×4): 5 mg via ORAL
  Filled 2020-05-14 (×6): qty 1

## 2020-05-14 MED ORDER — METRONIDAZOLE 500 MG PO TABS: 500.0000 mg | ORAL_TABLET | Freq: Three times a day (TID) | ORAL | Status: DC

## 2020-05-14 MED ORDER — SODIUM CHLORIDE 0.9 % IV SOLN
2.0000 g | Freq: Once | INTRAVENOUS | Status: AC
  Administered 2020-05-14: 2 g via INTRAVENOUS
  Filled 2020-05-14: qty 2

## 2020-05-14 MED ORDER — CITALOPRAM HYDROBROMIDE 20 MG PO TABS
10.0000 mg | ORAL_TABLET | Freq: Every day | ORAL | Status: DC
  Administered 2020-05-15 – 2020-06-15 (×31): 10 mg via ORAL
  Filled 2020-05-14 (×32): qty 1

## 2020-05-14 MED ORDER — SODIUM CHLORIDE 0.9 % IV SOLN
2.0000 g | Freq: Two times a day (BID) | INTRAVENOUS | Status: DC
  Administered 2020-05-15: 2 g via INTRAVENOUS
  Filled 2020-05-14: qty 2

## 2020-05-14 MED ORDER — ADULT MULTIVITAMIN W/MINERALS CH
1.0000 | ORAL_TABLET | Freq: Every day | ORAL | Status: DC
  Administered 2020-05-15 – 2020-06-15 (×31): 1 via ORAL
  Filled 2020-05-14 (×32): qty 1

## 2020-05-14 MED ORDER — INSULIN DETEMIR 100 UNIT/ML ~~LOC~~ SOLN
10.0000 [IU] | Freq: Every day | SUBCUTANEOUS | Status: DC
  Administered 2020-05-15 – 2020-06-06 (×21): 10 [IU] via SUBCUTANEOUS
  Filled 2020-05-14 (×26): qty 0.1

## 2020-05-14 MED ORDER — HYDRALAZINE HCL 50 MG PO TABS: 25.0000 mg | ORAL_TABLET | Freq: Three times a day (TID) | ORAL | Status: DC | PRN

## 2020-05-14 MED ORDER — INSULIN ASPART 100 UNIT/ML ~~LOC~~ SOLN
0.0000 [IU] | Freq: Three times a day (TID) | SUBCUTANEOUS | Status: DC
  Administered 2020-05-15: 11 [IU] via SUBCUTANEOUS
  Filled 2020-05-14: qty 1

## 2020-05-14 MED ORDER — SODIUM CHLORIDE 0.9 % IV SOLN: INTRAVENOUS | Status: AC

## 2020-05-14 MED ORDER — ALBUTEROL SULFATE (2.5 MG/3ML) 0.083% IN NEBU: 2.5000 mg | INHALATION_SOLUTION | Freq: Four times a day (QID) | RESPIRATORY_TRACT | Status: DC | PRN

## 2020-05-14 MED ORDER — VANCOMYCIN HCL IN DEXTROSE 1-5 GM/200ML-% IV SOLN
1000.0000 mg | Freq: Once | INTRAVENOUS | Status: AC
  Administered 2020-05-14: 1000 mg via INTRAVENOUS
  Filled 2020-05-14: qty 200

## 2020-05-14 MED ORDER — POLYETHYLENE GLYCOL 3350 17 G PO PACK
17.0000 g | PACK | Freq: Every day | ORAL | Status: DC | PRN
  Filled 2020-05-14: qty 1

## 2020-05-14 MED ORDER — SODIUM CHLORIDE 0.9 % IV SOLN: 2.0000 g | Freq: Once | INTRAVENOUS | Status: DC

## 2020-05-14 MED ORDER — INSULIN ASPART 100 UNIT/ML ~~LOC~~ SOLN
0.0000 [IU] | Freq: Every day | SUBCUTANEOUS | Status: DC
  Administered 2020-05-15: 2 [IU] via SUBCUTANEOUS
  Filled 2020-05-14: qty 1

## 2020-05-14 MED ORDER — LACTATED RINGERS IV BOLUS
1000.0000 mL | Freq: Once | INTRAVENOUS | Status: AC
  Administered 2020-05-14: 1000 mL via INTRAVENOUS

## 2020-05-14 MED ORDER — ONDANSETRON HCL 4 MG PO TABS: 4.0000 mg | ORAL_TABLET | Freq: Four times a day (QID) | ORAL | Status: DC | PRN

## 2020-05-14 MED ORDER — ONDANSETRON HCL 4 MG/2ML IJ SOLN: 4.0000 mg | Freq: Four times a day (QID) | INTRAMUSCULAR | Status: DC | PRN

## 2020-05-14 MED ORDER — METRONIDAZOLE IN NACL 5-0.79 MG/ML-% IV SOLN
500.0000 mg | Freq: Once | INTRAVENOUS | Status: AC
  Administered 2020-05-14: 500 mg via INTRAVENOUS
  Filled 2020-05-14: qty 100

## 2020-05-14 MED ORDER — FERROUS SULFATE 325 (65 FE) MG PO TABS
325.0000 mg | ORAL_TABLET | Freq: Every day | ORAL | Status: DC
  Administered 2020-05-15 – 2020-06-15 (×31): 325 mg via ORAL
  Filled 2020-05-14 (×31): qty 1

## 2020-05-14 MED ORDER — ENOXAPARIN SODIUM 40 MG/0.4ML ~~LOC~~ SOLN
40.0000 mg | SUBCUTANEOUS | Status: DC
  Administered 2020-05-15: 40 mg via SUBCUTANEOUS
  Filled 2020-05-14: qty 0.4

## 2020-05-14 MED ORDER — ACETAMINOPHEN 325 MG PO TABS
325.0000 mg | ORAL_TABLET | Freq: Four times a day (QID) | ORAL | Status: DC | PRN
  Administered 2020-06-01: 325 mg via ORAL
  Filled 2020-05-14: qty 1

## 2020-05-14 NOTE — ED Provider Notes (Addendum)
[facility] Emergency Department Provider Note  ____________________________________________   I have reviewed the triage vital signs and the nursing notes.   HISTORY  Chief Complaint Altered mental status   History limited by and level 5 caveat due to: AMS   HPI Lance [name] is a 74 y.o. male who presents to the emergency department today via EMS because of concerns for altered mental status.  Patient unfortunately cannot give any history.  Apparently friends went and checked on him today and found him on the ground and unresponsive.  They did then call 911.  The patient does not remember what happened to him.   Records reviewed. Per medical record review patient has a history of DM, schizophrenia.   Past Medical History:  Diagnosis Date  . Diabetes mellitus without complication ([place])   . Hepatitis C   . Schizophrenia [facility])     Patient Active Problem List   Diagnosis Date Noted  . Leg pain 12/07/2018  . PAD (peripheral artery disease) ([place]) 12/07/2018  . Acute upper GI bleed 10/10/2016  . Hypotension   . Polysubstance abuse ([place]) 03/10/2015  . Mass of arm 03/10/2015  . 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 03/10/2015  . Perianal abscess 08/31/2014  . 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 Adenomatous polyp of colon 08/30/2013  . Type 2 diabetes, controlled, with neuropathy ([place]) 08/26/2013  . Chronic viral hepatitis C ([place]) 03/21/2013  . Need for hepatitis B vaccination 03/21/2013  . Depression 10/23/2012  . IVDU (intravenous drug user) 10/23/2012  . Schizophrenia ([place]) 10/23/2012  . Carpal tunnel syndrome on both sides 07/31/2012  . Diabetic peripheral neuropathy ([place]) 07/31/2012  . Osteoarthritis of both hips 07/31/2012  . Esophageal reflux 10/21/2009  . Diverticulitis of colon 10/21/2009    Past Surgical History:  Procedure Laterality Date  . ESOPHAGOGASTRODUODENOSCOPY (EGD) WITH PROPOFOL N/A 10/10/2016   Procedure:  ESOPHAGOGASTRODUODENOSCOPY (EGD) WITH PROPOFOL;  Surgeon: Lucilla Lame, MD;  Location: [facility] ENDOSCOPY;  Service: Endoscopy;  Laterality: N/A;  . HERNIA REPAIR    . VISCERAL ARTERY INTERVENTION N/A 10/11/2016   Procedure: Visceral Artery Intervention;  Surgeon: Algernon Huxley, MD;  Location: [place] [facility];  Service: Cardiovascular;  Laterality: N/A;    Prior to Admission medications   Medication Sig Start Date End Date Taking? Authorizing Provider  acetaminophen (TYLENOL) 325 MG tablet Take 650 mg by mouth every 4 (four) hours as needed.    [provider]  albuterol (PROVENTIL HFA;VENTOLIN HFA) 108 (90 Base) MCG/ACT inhaler Inhale into the lungs every 6 (six) hours as needed for wheezing or shortness of breath.    [provider]  alum & mag hydroxide-simeth (MAALOX/MYLANTA) 200-200-20 MG/5ML suspension Take by mouth every 6 (six) hours as needed for indigestion or heartburn.    [provider]  buprenorphine-naloxone (SUBOXONE) 8-2 mg SUBL SL tablet Place under the tongue.    [provider]  Carboxymethylcellulose Sodium (ARTIFICIAL TEARS OP) Apply to eye as needed.    [provider]  cephALEXin (KEFLEX) 500 MG capsule Take 1 capsule (500 mg total) by mouth 2 (two) times daily. 06/24/18   Zara Council A, PA-C  citalopram (CELEXA) 10 MG tablet Take 10 mg by mouth daily.  06/16/18   [provider]  dicyclomine (BENTYL) 20 MG tablet  12/16/14   [provider]  docusate sodium (COLACE) 100 MG capsule Take by mouth. 03/08/15   [provider]  ferrous sulfate 325 (65 FE) MG tablet Take 325  mg by mouth daily with breakfast.    [provider]  gabapentin (NEURONTIN) 800 MG tablet 800 mg 3 (three) times daily.  04/16/18   [provider]  Glucose Blood (BLOOD GLUCOSE TEST STRIPS) STRP Use as instructed.to test BS twice daily 07/26/13   [provider]  guaiFENesin (ROBITUSSIN) 100 MG/5ML liquid Take 200  mg by mouth 3 (three) times daily as needed for cough.    [provider]  HARVONI 90-400 MG TABS  07/02/17   [provider]  hydroxypropyl methylcellulose / hypromellose (ISOPTO TEARS / GONIOVISC) 2.5 % ophthalmic solution Place 1 drop into both eyes as needed for dry eyes.    [provider]  Lancets 28G MISC Use as instructed.to test BS twice daily fastclix 07/26/13   [provider]  LEVEMIR FLEXTOUCH 100 UNIT/ML Pen 10 Units daily.  02/25/18   [provider]  loperamide (IMODIUM) 2 MG capsule  11/26/17   [provider]  magnesium hydroxide (MILK OF MAGNESIA) 400 MG/5ML suspension Take by mouth daily as needed for mild constipation.    [provider]  metFORMIN (GLUCOPHAGE) 1000 MG tablet 1,000 mg 2 (two) times daily with a meal.  04/16/18   [provider]  Multiple Vitamins-Minerals (GNP MENS MULTIPLUS PO) Take 1 tablet by mouth daily.    [provider]  Neomycin-Bacitracin-Polymyxin (TRIPLE ANTIBIOTIC) [phone] OINT Apply topically.    [provider]  OLANZapine (ZYPREXA) 5 MG tablet Take 5 mg by mouth at bedtime.    [provider]  polyethylene glycol (MIRALAX / GLYCOLAX) packet Take 17 g by mouth daily as needed for mild constipation. 10/14/16   Fritzi Mandes, MD  sitaGLIPtin (JANUVIA) 100 MG tablet TAKE 1 TABLET BY MOUTH EVERY DAY 01/23/16   [provider]  sucralfate (CARAFATE) 1 g tablet 1 g 4 (four) times daily.  02/16/18   [provider]  tamsulosin (FLOMAX) 0.4 MG CAPS capsule TAKE 2 CAPSULES BY MOUTH ONCE DAILY 08/04/19   Bernardo Heater, Ronda Fairly, MD  triamcinolone (KENALOG) 0.025 % cream Apply topically twice daily 04/15/19   Gardiner Barefoot, DPM  TRUEPLUS PEN NEEDLES 32G X 4 MM MISC  08/14/18   [provider]    Allergies Patient has no known allergies.  Family History  Problem Relation Age of Onset  . Diabetes Sister     Social History Social History    Tobacco Use  . Smoking status: Current Every Day Smoker    Packs/day: 0.25  . Smokeless tobacco: Never Used  Vaping Use  . Vaping Use: Never used  Substance Use Topics  . Alcohol use: Not Currently    Alcohol/week: 10.0 standard drinks    Types: 10 Shots of liquor per week  . Drug use: No    Review of Systems Unable to obtain reliable ROS secondary to AMS ____________________________________________   PHYSICAL EXAM:  VITAL SIGNS: ED Triage Vitals  Enc Vitals Group     BP 05/14/20 1659 129/65     Pulse Rate 05/14/20 1659 85     Resp 05/14/20 1659 16     Temp 05/14/20 1659 (!) 97.5 F (36.4 C)     Temp Source 05/14/20 1659 Oral     SpO2 05/14/20 1659 98 %     Weight 05/14/20 1714 180 lb (81.6 kg)     Height 05/14/20 1714 5\' 9"  (1.753 m)     Head Circumference --      Peak Flow --  Pain Score 05/14/20 1714 0   Constitutional: Awake and alert. Oriented to name.  Eyes: Conjunctivae are normal.  ENT      Head: Normocephalic and atraumatic.      Nose: No congestion/rhinnorhea.      Mouth/Throat: Mucous membranes are moist.      Neck: No stridor. Hematological/Lymphatic/Immunilogical: No cervical lymphadenopathy. Cardiovascular: Normal rate, regular rhythm.  No murmurs, rubs, or gallops.  Respiratory: Normal respiratory effort without tachypnea nor retractions. Breath sounds are clear and equal bilaterally. No wheezes/rales/rhonchi. Gastrointestinal: Soft and non tender. No rebound. No guarding.  Genitourinary: Deferred Musculoskeletal: Normal range of motion in all extremities. No lower extremity edema. Neurologic:  Awake and alert. Oriented to name.  Skin:  Skin is warm, dry and intact. No rash noted. ____________________________________________    LABS (pertinent positives/negatives)  Ammonia 40 Lactic acid 1.9 Trop hs 11 COVID negative CMP wnl except glu 381 CBC wbc 15.2, hgb 13.8, plt 215 CK 548 UDS positive cocaine ABG pH 7.50, pco2 30 UA hazy,  moderate hgb dipstick, trace leukocytes, 0-5 rbc, 21-50 wbc   EKG I, Nance Pear, attending physician, personally viewed and interpreted this EKG  EKG Time: 1649 Rate: 86 Rhythm: sinus rhythm Axis: normal Intervals: qtc 463 QRS: narrow ST changes: no st elevation Impression: normal ekg  ____________________________________________    RADIOLOGY  CXR No acute abnormality  CT head Mass present without midline shift. No acute abnormality.  ____________________________________________   PROCEDURES  Procedures  CRITICAL CARE Performed by: Nance Pear   Total critical care time: 40 minutes  Critical care time was exclusive of separately billable procedures and treating other patients.  Critical care was necessary to treat or prevent imminent or life-threatening deterioration.  Critical care was time spent personally by me on the following activities: development of treatment plan with patient and/or surrogate as well as nursing, discussions with consultants, evaluation of patient's response to treatment, examination of patient, obtaining history from patient or surrogate, ordering and performing treatments and interventions, ordering and review of laboratory studies, ordering and review of radiographic studies, pulse oximetry and re-evaluation of patient's condition.  ____________________________________________   INITIAL IMPRESSION / ASSESSMENT AND PLAN / ED COURSE  Pertinent labs & imaging results that were available during my care of the patient were reviewed by me and considered in my medical decision making (see chart for details).   Patient presented to the emergency department today via EMS because of concerns for altered mental status.  The patient cannot give any specific history.  EMS did report that he had elevated blood sugar.  Broad work-up was initiated.  Head CT does show a mass however no mass-effect.  Likely meningioma.  Blood work was concerning  for leukocytosis as well as lactic acidosis.  This did raise concern for infection patient was given antibiotics.  Chest x-ray without pneumonia.  Urine was concerning for infection.  Will plan on admission to the hospitalist service.  ____________________________________________   FINAL CLINICAL IMPRESSION(S) / ED DIAGNOSES  Final diagnoses:  Altered mental status, unspecified altered mental status type  Lower urinary tract infectious disease  Lactic acidosis     Note: This dictation was prepared with Dragon dictation. Any transcriptional errors that result from this process are unintentional     Nance Pear, MD 05/14/20 2214    Nance Pear, MD 05/14/20 2241

## 2020-05-14 NOTE — ED Notes (Signed)
Assumed care of pt, pt sleeping and awakens to placement of telemetry leads on chest. Lethargic and oriented to self, place, and year. Disoriented to situation. Denies trauma to head. Denies pain. Side rails up x2, call bell within reach, urinal at bedside, brief in place, non-skid socks in place, blanket provided, bed alarm in place.

## 2020-05-14 NOTE — ED Triage Notes (Signed)
Pt brought in via EMS  For altered mental status.  Found by family friend/laying on couch unresponsive.  EMS states CBG was greater than 500.  NS initiated en route.  Pt is lethargic, but answers name and DOB.

## 2020-05-14 NOTE — ED Notes (Signed)
Pt taken to CT.

## 2020-05-14 NOTE — ED Notes (Signed)
Pt out of bed prior to RN able to reach him with bed alarm going off. Pt had crawled out of bottom of bed insisting on getting up to use restroom. Pt had already voided in brief. RN and assisting RN assisted pt back into bed. Pt reminded not to get out of bed without RN help. Non-skid socks remain in place, bed alarm remains on, call bell at bedside, side rails up x2. Head of bed lowered with feet of bed elevate to decrease difficulty and slow pt from getting out of bed. Water provided and at bedside

## 2020-05-14 NOTE — Progress Notes (Signed)
Pharmacy Antibiotic Note  Lance [name] is a 74 y.o. male admitted on 05/14/2020 with sepsis.  Pharmacy has been consulted for Vancomycin , Cefepime  dosing.  Plan: Vancomycin 1 gm IV X 1 given in ED on 12/5 @ ~ 2100. Additional Vanc 1 gm IV X 1 ordered to make total loading dose of 2 gm. Vancomycin 750 mg IV Q12H ordered to start on 12/6 @ 0900.  Cefepime 2 gm IV X 1 given in ED on 12/5 @ ~2000. Cefepime 2 gm IV Q12H ordered to continue on 12/6 @ 0800.   Height: 5\' 9"  (175.3 cm) Weight: 81.6 kg (180 lb) IBW/kg (Calculated) : 70.7  Temp (24hrs), Avg:97.5 F (36.4 C), Min:97.5 F (36.4 C), Max:97.5 F (36.4 C)  Recent Labs  Lab 05/14/20 1833 05/14/20 2003  WBC 15.2*  --   CREATININE 1.16  --   LATICACIDVEN 3.1* 1.9    Estimated Creatinine Clearance: 57.6 mL/min (by C-G formula based on SCr of 1.16 mg/dL).    No Known Allergies  Antimicrobials this admission:   >>    >>   Dose adjustments this admission:   Microbiology results:  BCx:   UCx:    Sputum:    MRSA PCR:   Thank you for allowing pharmacy to be a part of this patient's care.  Annamarie Yamaguchi D 05/14/2020 10:52 PM

## 2020-05-14 NOTE — H&P (Addendum)
History and Physical   Lance CHIARELLI MWN:[id number] DOB: 04/12/1946 DOA: 05/14/2020  PCP: Theotis Burrow, MD  Outpatient Specialists: [facility] Patient coming from: Home via EMS  I have personally briefly reviewed patient's old medical records in [facility].  Chief Concern: Altered mentation  HPI: Lance [name] is a 74 y.o. male with medical history significant for hepatitis C was treated with Harvoni, insulin-dependent diabetes mellitus presented to the emergency department for chief concerns of being found unresponsive/altered mentation by his neighbors or friends.  No one is at bedside on my evaluation.  Patient does not want anyone to be called.  He does not know what happened.  He reports that he ate dinner today.  He denies anything out of the ordinary.  He denies drug use on day of admission.  He endorses compliance with medications.  ROS was negative for headache, vision changes, chest pain, shortness of breath, nausea, vomiting, dysphagia, fever, chills, dysuria, hematuria, diarrhea, weakness in legs, appetite changes, unintentional weight loss.   Social history: lives by himself. Endorses cocaine use and that the last time he used cocaine was about 1 week ago. He lasted drink etoh 1 week ago.   ED Course: Discussed with ED provider  Review of Systems: As per HPI otherwise 10 point review of systems negative.   Assessment/Plan  Active Problems:   Sepsis ([place])   Altered level of consciousness Sepsis Leukocytosis -Status post sepsis protocol and bolus per ED provider -Continue Vanco and cefepime per pharmacy -Blood cultures pending -UA was positive for leukocytes however no urinary symptoms -Continue coverage  Altered level of consciousness Query syncope -Etiology unclear at this time, query polypharmacy versus cocaine use -Echo ordered -Checking TSH -Checking D-dimer, if positive would recommend CTA to assess for  PE  Insulin-dependent diabetes mellitus-resumed home insulin -Insulin SSI -Checking A1c  Chart reviewed.   DVT prophylaxis: enoxaparin  Code Status: full code Diet: Carb modified/heart healthy Family Communication: he does not want anyone updated Disposition Plan: Pending clinical course Consults called: None at this time Admission status: Observation with telemetry  Past Medical History:  Diagnosis Date  . Diabetes mellitus without complication ([place])   . Hepatitis C   . Schizophrenia [facility])    Past Surgical History:  Procedure Laterality Date  . ESOPHAGOGASTRODUODENOSCOPY (EGD) WITH PROPOFOL N/A 10/10/2016   Procedure: ESOPHAGOGASTRODUODENOSCOPY (EGD) WITH PROPOFOL;  Surgeon: Lucilla Lame, MD;  Location: [facility] ENDOSCOPY;  Service: Endoscopy;  Laterality: N/A;  . HERNIA REPAIR    . VISCERAL ARTERY INTERVENTION N/A 10/11/2016   Procedure: Visceral Artery Intervention;  Surgeon: Algernon Huxley, MD;  Location: [facility];  Service: Cardiovascular;  Laterality: N/A;   Social History:  reports that he has been smoking. He has been smoking about 0.25 packs per day. He has never used smokeless tobacco. He reports previous alcohol use of about 10.0 standard drinks of alcohol per week. He reports that he does not use drugs.  No Known Allergies Family History  Problem Relation Age of Onset  . Diabetes Sister    Family history: Family history reviewed and not pertinent. He denies family history of cancer.   Prior to Admission medications   Medication Sig Start Date End Date Taking? Authorizing Provider  acetaminophen (TYLENOL) 325 MG tablet Take 650 mg by mouth every 4 (four) hours as needed.    [provider]  albuterol (PROVENTIL HFA;VENTOLIN HFA) 108 (90 Base) MCG/ACT inhaler Inhale into the lungs every 6 (six) hours as  needed for wheezing or shortness of breath.    [provider]  alum & mag hydroxide-simeth (MAALOX/MYLANTA) 200-200-20 MG/5ML suspension Take  by mouth every 6 (six) hours as needed for indigestion or heartburn.    [provider]  buprenorphine-naloxone (SUBOXONE) 8-2 mg SUBL SL tablet Place under the tongue.    [provider]  Carboxymethylcellulose Sodium (ARTIFICIAL TEARS OP) Apply to eye as needed.    [provider]  citalopram (CELEXA) 10 MG tablet Take 10 mg by mouth daily.  06/16/18   [provider]  dicyclomine (BENTYL) 20 MG tablet  12/16/14   [provider]  docusate sodium (COLACE) 100 MG capsule Take by mouth. 03/08/15   [provider]  ferrous sulfate 325 (65 FE) MG tablet Take 325 mg by mouth daily with breakfast.    [provider]  gabapentin (NEURONTIN) 800 MG tablet 800 mg 3 (three) times daily.  04/16/18   [provider]  guaiFENesin (ROBITUSSIN) 100 MG/5ML liquid Take 200 mg by mouth 3 (three) times daily as needed for cough.    [provider]  HARVONI 90-400 MG TABS  07/02/17   [provider]  hydroxypropyl methylcellulose / hypromellose (ISOPTO TEARS / GONIOVISC) 2.5 % ophthalmic solution Place 1 drop into both eyes as needed for dry eyes.    [provider]  LEVEMIR FLEXTOUCH 100 UNIT/ML Pen 10 Units daily.  02/25/18   [provider]  loperamide (IMODIUM) 2 MG capsule  11/26/17   [provider]  magnesium hydroxide (MILK OF MAGNESIA) 400 MG/5ML suspension Take by mouth daily as needed for mild constipation.    [provider]  metFORMIN (GLUCOPHAGE) 1000 MG tablet 1,000 mg 2 (two) times daily with a meal.  04/16/18   [provider]  Multiple Vitamins-Minerals (GNP MENS MULTIPLUS PO) Take 1 tablet by mouth daily.    [provider]  Neomycin-Bacitracin-Polymyxin (TRIPLE ANTIBIOTIC) [phone] OINT Apply topically.    [provider]  OLANZapine (ZYPREXA) 5 MG tablet Take 5 mg by mouth at bedtime.    [provider]  polyethylene glycol (MIRALAX /  GLYCOLAX) packet Take 17 g by mouth daily as needed for mild constipation. 10/14/16   Fritzi Mandes, MD  sitaGLIPtin (JANUVIA) 100 MG tablet TAKE 1 TABLET BY MOUTH EVERY DAY 01/23/16   [provider]  sucralfate (CARAFATE) 1 g tablet 1 g 4 (four) times daily.  02/16/18   [provider]  tamsulosin (FLOMAX) 0.4 MG CAPS capsule TAKE 2 CAPSULES BY MOUTH ONCE DAILY 08/04/19   Bernardo Heater, Ronda Fairly, MD  triamcinolone (KENALOG) 0.025 % cream Apply topically twice daily 04/15/19   Gardiner Barefoot, DPM   Physical Exam: Vitals:   05/14/20 1930 05/14/20 2000 05/14/20 2112 05/14/20 2130  BP: (!) 150/82 (!) 158/80 (!) 149/120 (!) 158/86  Pulse: 82 79 71 78  Resp: 13 13 15 16   Temp:      TempSrc:      SpO2: 99% 100% 100% 99%  Weight:      Height:       Constitutional: appears age appropriate, NAD, calm, comfortable Eyes: PERRL, lids and conjunctivae normal ENMT: Mucous membranes are moist. Posterior pharynx clear of any exudate or lesions. Age-appropriate dentition. Hearing appropriate Neck: normal, supple, no masses, no thyromegaly Respiratory: clear to auscultation bilaterally, no wheezing, no crackles. Normal respiratory effort. No accessory muscle use.  Cardiovascular: Regular rate and rhythm, no murmurs / rubs / gallops. No extremity edema. 2+ pedal pulses.  No carotid bruits.  Abdomen: no tenderness, no masses palpated, no hepatosplenomegaly. Bowel sounds positive.  Musculoskeletal: no clubbing / cyanosis. No joint deformity upper and lower extremities. Good ROM, no contractures, no atrophy. Normal muscle tone.  Skin: no rashes, lesions, ulcers. No induration Neurologic: Sensation intact. Strength 5/5 in all 4.  Psychiatric: Normal judgment and insight. Alert and oriented x 3. Normal mood.   EKG: Independently reviewed, showing sinus rhythm with rate of 86, QTc 463.  Chest x-ray on Admission: Personally reviewed and I agree with radiologist reading as below.  CT Head Wo  Contrast  Result Date: 05/14/2020 CLINICAL DATA:  Altered level of consciousness, hyperglycemia EXAM: CT HEAD WITHOUT CONTRAST TECHNIQUE: Contiguous axial images were obtained from the base of the skull through the vertex without intravenous contrast. COMPARISON:  None. FINDINGS: Brain: Confluent hypodensities in the periventricular white matter are nonspecific, but likely reflect age-indeterminate small vessel ischemic change. No other signs of acute infarct. There is a 4.1 x 1.9 x 2.6 cm extra-axial mass abutting the right frontal lobe and anterior falx, with no significant mass effect on the adjacent brain parenchyma. Overall findings are most consistent with meningioma. No signs of acute hemorrhage. Lateral ventricles and remaining midline structures are unremarkable. No acute extra-axial fluid collections. Vascular: No hyperdense vessel or unexpected calcification. Skull: Normal. Negative for fracture or focal lesion. Sinuses/Orbits: No acute finding. Other: None. IMPRESSION: 1. Right frontal extra-axial mass, without significant mass effect on the brain parenchyma, most compatible with meningioma. This could be confirmed with nonemergent follow-up MRI. 2. Age-indeterminate small vessel ischemic changes within the white matter, likely chronic. Electronically Signed   By: Randa Ngo M.D.   On: 05/14/2020 19:01   DG Chest Port 1 View  Result Date: 05/14/2020 CLINICAL DATA:  Altered level of consciousness, sepsis, hyperglycemia EXAM: PORTABLE CHEST 1 VIEW COMPARISON:  08/25/2017 FINDINGS: Single frontal view of the chest demonstrates an unremarkable cardiac silhouette. No airspace disease, effusion, or pneumothorax. No acute bony abnormality. IMPRESSION: 1. Stable exam, no acute process. Electronically Signed   By: Randa Ngo M.D.   On: 05/14/2020 17:40   Labs on Admission: I have personally reviewed following labs  CBC: Recent Labs  Lab 05/14/20 1833  WBC 15.2*  NEUTROABS 13.2*  HGB 13.8   HCT 40.3  MCV 88.8  PLT 109   Basic Metabolic Panel: Recent Labs  Lab 05/14/20 1833  NA 138  K 4.1  CL 103  CO2 26  GLUCOSE 381*  BUN 12  CREATININE 1.16  CALCIUM 9.3   GFR: Estimated Creatinine Clearance: 57.6 mL/min (by C-G formula based on SCr of 1.16 mg/dL). Liver Function Tests: Recent Labs  Lab 05/14/20 1833  AST 21  ALT 15  ALKPHOS 120  BILITOT 0.7  PROT 7.9  ALBUMIN 3.6   Recent Labs  Lab 05/14/20 2003  AMMONIA 40*   Coagulation Profile: Recent Labs  Lab 05/14/20 1833  INR 1.0   Cardiac Enzymes: Recent Labs  Lab 05/14/20 1821  CKTOTAL 548*   CBG: Recent Labs  Lab 05/14/20 1957  GLUCAP 329*   Urine analysis:    Component Value Date/Time   COLORURINE STRAW (A) 05/14/2020 1720   APPEARANCEUR HAZY (A) 05/14/2020 1720   APPEARANCEUR Clear 10/05/2018 1028   LABSPEC 1.028 05/14/2020 1720   PHURINE 5.0 05/14/2020 1720   GLUCOSEU >=500 (A) 05/14/2020 1720   HGBUR MODERATE (A) 05/14/2020 1720   BILIRUBINUR NEGATIVE 05/14/2020 1720   BILIRUBINUR Negative 10/05/2018 1028   KETONESUR NEGATIVE  05/14/2020 1720   PROTEINUR 30 (A) 05/14/2020 1720   NITRITE NEGATIVE 05/14/2020 1720   LEUKOCYTESUR TRACE (A) 05/14/2020 1720   Murriel Eidem N Duanne Duchesne D.O. [facility]  If 12AM-7AM, please contact overnight-coverage provider If 7AM-7PM, please contact day coverage provider [email or web address]  05/14/2020, 10:23 PM

## 2020-05-14 NOTE — ED Notes (Signed)
Pt removed himself from tele monitor, not tolerating at this time

## 2020-05-15 ENCOUNTER — Encounter: Payer: Self-pay | Admitting: Internal Medicine

## 2020-05-15 ENCOUNTER — Other Ambulatory Visit: Payer: Self-pay

## 2020-05-15 ENCOUNTER — Inpatient Hospital Stay: Payer: Medicare HMO

## 2020-05-15 ENCOUNTER — Observation Stay
Admit: 2020-05-15 | Discharge: 2020-05-15 | Disposition: A | Discharge status: Home / Self Care | Payer: Medicare HMO | Attending: Internal Medicine | Admitting: Internal Medicine

## 2020-05-15 DIAGNOSIS — E876 Hypokalemia: Secondary | ICD-10-CM | POA: Diagnosis not present

## 2020-05-15 DIAGNOSIS — N400 Enlarged prostate without lower urinary tract symptoms: Secondary | ICD-10-CM | POA: Diagnosis present

## 2020-05-15 DIAGNOSIS — K5900 Constipation, unspecified: Secondary | ICD-10-CM | POA: Diagnosis present

## 2020-05-15 DIAGNOSIS — E854 Organ-limited amyloidosis: Secondary | ICD-10-CM | POA: Diagnosis present

## 2020-05-15 DIAGNOSIS — R55 Syncope and collapse: Secondary | ICD-10-CM | POA: Diagnosis not present

## 2020-05-15 DIAGNOSIS — G9341 Metabolic encephalopathy: Secondary | ICD-10-CM | POA: Diagnosis present

## 2020-05-15 DIAGNOSIS — F1410 Cocaine abuse, uncomplicated: Secondary | ICD-10-CM

## 2020-05-15 DIAGNOSIS — A419 Sepsis, unspecified organism: Secondary | ICD-10-CM | POA: Diagnosis not present

## 2020-05-15 DIAGNOSIS — R0902 Hypoxemia: Secondary | ICD-10-CM | POA: Diagnosis not present

## 2020-05-15 DIAGNOSIS — R4182 Altered mental status, unspecified: Secondary | ICD-10-CM | POA: Diagnosis not present

## 2020-05-15 DIAGNOSIS — D649 Anemia, unspecified: Secondary | ICD-10-CM | POA: Diagnosis present

## 2020-05-15 DIAGNOSIS — Z5320 Procedure and treatment not carried out because of patient's decision for unspecified reasons: Secondary | ICD-10-CM | POA: Diagnosis not present

## 2020-05-15 DIAGNOSIS — E872 Acidosis: Secondary | ICD-10-CM | POA: Diagnosis present

## 2020-05-15 DIAGNOSIS — R569 Unspecified convulsions: Secondary | ICD-10-CM | POA: Diagnosis not present

## 2020-05-15 DIAGNOSIS — Z20822 Contact with and (suspected) exposure to covid-19: Secondary | ICD-10-CM | POA: Diagnosis present

## 2020-05-15 DIAGNOSIS — R7989 Other specified abnormal findings of blood chemistry: Secondary | ICD-10-CM | POA: Diagnosis present

## 2020-05-15 DIAGNOSIS — F17210 Nicotine dependence, cigarettes, uncomplicated: Secondary | ICD-10-CM | POA: Diagnosis present

## 2020-05-15 DIAGNOSIS — Z794 Long term (current) use of insulin: Secondary | ICD-10-CM | POA: Diagnosis not present

## 2020-05-15 DIAGNOSIS — G894 Chronic pain syndrome: Secondary | ICD-10-CM | POA: Diagnosis present

## 2020-05-15 DIAGNOSIS — B9689 Other specified bacterial agents as the cause of diseases classified elsewhere: Secondary | ICD-10-CM | POA: Diagnosis present

## 2020-05-15 DIAGNOSIS — E1165 Type 2 diabetes mellitus with hyperglycemia: Secondary | ICD-10-CM | POA: Diagnosis present

## 2020-05-15 DIAGNOSIS — Z21 Asymptomatic human immunodeficiency virus [HIV] infection status: Secondary | ICD-10-CM | POA: Diagnosis present

## 2020-05-15 DIAGNOSIS — D329 Benign neoplasm of meninges, unspecified: Secondary | ICD-10-CM | POA: Diagnosis not present

## 2020-05-15 DIAGNOSIS — R410 Disorientation, unspecified: Secondary | ICD-10-CM | POA: Diagnosis not present

## 2020-05-15 DIAGNOSIS — I680 Cerebral amyloid angiopathy: Secondary | ICD-10-CM | POA: Diagnosis present

## 2020-05-15 DIAGNOSIS — Z833 Family history of diabetes mellitus: Secondary | ICD-10-CM | POA: Diagnosis not present

## 2020-05-15 DIAGNOSIS — G936 Cerebral edema: Secondary | ICD-10-CM | POA: Diagnosis present

## 2020-05-15 DIAGNOSIS — R531 Weakness: Secondary | ICD-10-CM | POA: Diagnosis not present

## 2020-05-15 DIAGNOSIS — S065X9A Traumatic subdural hemorrhage with loss of consciousness of unspecified duration, initial encounter: Secondary | ICD-10-CM | POA: Diagnosis present

## 2020-05-15 DIAGNOSIS — D320 Benign neoplasm of cerebral meninges: Secondary | ICD-10-CM | POA: Diagnosis present

## 2020-05-15 DIAGNOSIS — E440 Moderate protein-calorie malnutrition: Secondary | ICD-10-CM | POA: Diagnosis present

## 2020-05-15 DIAGNOSIS — W19XXXA Unspecified fall, initial encounter: Secondary | ICD-10-CM | POA: Diagnosis present

## 2020-05-15 DIAGNOSIS — I618 Other nontraumatic intracerebral hemorrhage: Secondary | ICD-10-CM | POA: Diagnosis not present

## 2020-05-15 DIAGNOSIS — G40909 Epilepsy, unspecified, not intractable, without status epilepticus: Secondary | ICD-10-CM | POA: Diagnosis not present

## 2020-05-15 DIAGNOSIS — N390 Urinary tract infection, site not specified: Secondary | ICD-10-CM | POA: Diagnosis present

## 2020-05-15 DIAGNOSIS — G9340 Encephalopathy, unspecified: Secondary | ICD-10-CM | POA: Diagnosis not present

## 2020-05-15 DIAGNOSIS — Z79899 Other long term (current) drug therapy: Secondary | ICD-10-CM | POA: Diagnosis not present

## 2020-05-15 LAB — HIV ANTIBODY (ROUTINE TESTING W REFLEX): HIV Screen 4th Generation wRfx: NONREACTIVE

## 2020-05-15 LAB — ECHOCARDIOGRAM COMPLETE
AR max vel: 2.17 cm2
AV Area VTI: 2.55 cm2
AV Area mean vel: 2.1 cm2
AV Mean grad: 3 mmHg
AV Peak grad: 5.5 mmHg
Ao pk vel: 1.17 m/s
Area-P 1/2: 2.42 cm2
Height: 69 in
S' Lateral: 2.62 cm
Weight: 2880 oz

## 2020-05-15 LAB — HEMOGLOBIN A1C
Hgb A1c MFr Bld: 10.8 % — ABNORMAL HIGH (ref 4.8–5.6)
Mean Plasma Glucose: 263.26 mg/dL

## 2020-05-15 LAB — CBG MONITORING, ED
Glucose-Capillary: 213 mg/dL — ABNORMAL HIGH (ref 70–99)
Glucose-Capillary: 309 mg/dL — ABNORMAL HIGH (ref 70–99)

## 2020-05-15 LAB — GLUCOSE, CAPILLARY
Glucose-Capillary: 183 mg/dL — ABNORMAL HIGH (ref 70–99)
Glucose-Capillary: 197 mg/dL — ABNORMAL HIGH (ref 70–99)

## 2020-05-15 LAB — PROTIME-INR
INR: 1.1 (ref 0.8–1.2)
Prothrombin Time: 13.5 seconds (ref 11.4–15.2)

## 2020-05-15 LAB — CORTISOL-AM, BLOOD: Cortisol - AM: 11.5 ug/dL (ref 6.7–22.6)

## 2020-05-15 LAB — FIBRIN DERIVATIVES D-DIMER (ARMC ONLY): Fibrin derivatives D-dimer (ARMC): 1417.44 ng/mL (FEU) — ABNORMAL HIGH (ref 0.00–499.00)

## 2020-05-15 LAB — FOLATE: Folate: 13.1 ng/mL (ref >5.9)

## 2020-05-15 LAB — TSH: TSH: 1.225 u[IU]/mL (ref 0.350–4.500)

## 2020-05-15 LAB — VITAMIN B12: Vitamin B-12: 294 pg/mL (ref 180–914)

## 2020-05-15 LAB — PROCALCITONIN: Procalcitonin: <0.1 ng/mL

## 2020-05-15 MED ORDER — LEDIPASVIR-SOFOSBUVIR 90-400 MG PO TABS
1.0000 | ORAL_TABLET | Freq: Every day | ORAL | Status: DC
  Administered 2020-05-26: 09:00:00 1 via ORAL
  Filled 2020-05-15 (×2): qty 1

## 2020-05-15 MED ORDER — THIAMINE HCL 100 MG/ML IJ SOLN
500.0000 mg | Freq: Three times a day (TID) | INTRAVENOUS | Status: AC
  Administered 2020-05-16 – 2020-05-17 (×4): 500 mg via INTRAVENOUS
  Filled 2020-05-15 (×7): qty 5

## 2020-05-15 MED ORDER — HALOPERIDOL LACTATE 5 MG/ML IJ SOLN: 2.0000 mg | Freq: Four times a day (QID) | INTRAMUSCULAR | Status: DC | PRN

## 2020-05-15 MED ORDER — POLYVINYL ALCOHOL 1.4 % OP SOLN
1.0000 [drp] | OPHTHALMIC | Status: DC | PRN
  Filled 2020-05-15: qty 15

## 2020-05-15 MED ORDER — THIAMINE HCL 100 MG/ML IJ SOLN
100.0000 mg | Freq: Every day | INTRAMUSCULAR | Status: DC
  Administered 2020-05-23 – 2020-05-27 (×3): 100 mg via INTRAVENOUS
  Filled 2020-05-15 (×4): qty 2

## 2020-05-15 MED ORDER — GADOBUTROL 1 MMOL/ML IV SOLN
8.0000 mL | Freq: Once | INTRAVENOUS | Status: AC | PRN
  Administered 2020-05-15: 8 mL via INTRAVENOUS

## 2020-05-15 MED ORDER — HYPROMELLOSE (GONIOSCOPIC) 2.5 % OP SOLN: 1.0000 [drp] | OPHTHALMIC | Status: DC | PRN

## 2020-05-15 MED ORDER — GABAPENTIN 400 MG PO CAPS
800.0000 mg | ORAL_CAPSULE | Freq: Three times a day (TID) | ORAL | Status: DC
  Administered 2020-05-15: 800 mg via ORAL
  Filled 2020-05-15: qty 2
  Filled 2020-05-15: qty 8
  Filled 2020-05-15: qty 2

## 2020-05-15 MED ORDER — BUPRENORPHINE HCL-NALOXONE HCL 8-2 MG SL SUBL
1.0000 | SUBLINGUAL_TABLET | Freq: Every day | SUBLINGUAL | Status: DC
  Administered 2020-05-15 – 2020-05-28 (×13): 1 via SUBLINGUAL
  Filled 2020-05-15 (×14): qty 1

## 2020-05-15 MED ORDER — LEVETIRACETAM IN NACL 500 MG/100ML IV SOLN
500.0000 mg | Freq: Two times a day (BID) | INTRAVENOUS | Status: DC
  Filled 2020-05-15 (×2): qty 100

## 2020-05-15 MED ORDER — THIAMINE HCL 100 MG/ML IJ SOLN
250.0000 mg | Freq: Every day | INTRAVENOUS | Status: AC
  Administered 2020-05-17 – 2020-05-22 (×6): 250 mg via INTRAVENOUS
  Filled 2020-05-15 (×6): qty 2.5

## 2020-05-15 NOTE — ED Notes (Signed)
Pt allowed RN to remove monitors and IVF and is now walking around room stating he is going to the BR.  Pt demands that RN "open this door" while tapping on the back wall of the room.  When RN asks pt where he is, he states at my house.  Pt informed he is in hospital and he states "I know I'm not."  Pt tries to place pulse/ox cord around his waist and tie as if it were a belt.  Pt insists that he is home and is changing his pants, as he steps into the blanket he is holding.  Pt continues to swat at staff as they attempt to assist him.

## 2020-05-15 NOTE — ED Notes (Signed)
Pt got back into bed and covered back up.  Pt states he is in the hospital and that he knows he was telling us that he was in his house and that he truly thought that he was in his house.  Pt placed back on monitors. IVF restarted and call bell in reach.  Pt clean and dry, new blanket provided.

## 2020-05-15 NOTE — ED Notes (Signed)
Pt again out of bed and confused,the patient pulled IV put, urinated on floor and dropped lunch tray on floor.

## 2020-05-15 NOTE — Progress Notes (Addendum)
PROGRESS NOTE  Lance [name] HEN:[id number] DOB: 12/29/1945 DOA: 05/14/2020 PCP: Theotis Burrow, MD  Brief History   Lance [name] is a 74 y.o. male with medical history significant for hepatitis C was treated with Harvoni, insulin-dependent diabetes mellitus presented to the emergency department for chief concerns of being found unresponsive/altered mentation by his neighbors or friends.  No one is at bedside on my evaluation.  Patient does not want anyone to be called.  He does not know what happened.  He reports that he ate dinner today.  He denies anything out of the ordinary.  He denies drug use on day of admission.  He endorses compliance with medications.  ROS was negative for headache, vision changes, chest pain, shortness of breath, nausea, vomiting, dysphagia, fever, chills, dysuria, hematuria, diarrhea, weakness in legs, appetite changes, unintentional weight loss.   Social history: lives by himself. Endorses cocaine use and that the last time he used cocaine was about 1 week ago. He lasted drink etoh 1 week ago.   [facility] were consulted to admit the patient for further evaluation and treatment.   The patient tells me that he only remembers waking up on the ground. He states that he had no warning that he was going to pass out. No chest pain or shortness of breath prior to event. Feels fine now.  Consultants  . Neurology  Procedures  . EEG  Antibiotics   Anti-infectives (From admission, onward)   Start     Dose/Rate Route Frequency Ordered Stop   05/15/20 1230  Ledipasvir-Sofosbuvir 90-400 MG TABS 1 tablet        1 tablet Oral Daily 05/15/20 1223     05/15/20 0900  vancomycin (VANCOREADY) IVPB 750 mg/150 mL        750 mg 150 mL/hr over 60 Minutes Intravenous Every 12 hours 05/14/20 2251     05/15/20 0800  ceFEPIme (MAXIPIME) 2 g in sodium chloride 0.9 % 100 mL IVPB  Status:  Discontinued        2 g 200 mL/hr over 30 Minutes Intravenous Every  12 hours 05/14/20 2250 05/15/20 1321   05/14/20 2300  vancomycin (VANCOCIN) IVPB 1000 mg/200 mL premix        1,000 mg 200 mL/hr over 60 Minutes Intravenous  Once 05/14/20 2250 05/15/20 0156   05/14/20 2245  ceFEPIme (MAXIPIME) 2 g in sodium chloride 0.9 % 100 mL IVPB  Status:  Discontinued        2 g 200 mL/hr over 30 Minutes Intravenous  Once 05/14/20 2244 05/14/20 2248   05/14/20 2245  vancomycin (VANCOCIN) IVPB 1000 mg/200 mL premix  Status:  Discontinued        1,000 mg 200 mL/hr over 60 Minutes Intravenous  Once 05/14/20 2244 05/14/20 2248   05/14/20 2245  metroNIDAZOLE (FLAGYL) tablet 500 mg  Status:  Discontinued        500 mg Oral Every 8 hours 05/14/20 2244 05/14/20 2248   05/14/20 1930  ceFEPIme (MAXIPIME) 2 g in sodium chloride 0.9 % 100 mL IVPB        2 g 200 mL/hr over 30 Minutes Intravenous  Once 05/14/20 1923 05/14/20 2106   05/14/20 1930  metroNIDAZOLE (FLAGYL) IVPB 500 mg        500 mg 100 mL/hr over 60 Minutes Intravenous  Once 05/14/20 1923 05/14/20 2106   05/14/20 1930  vancomycin (VANCOCIN) IVPB 1000 mg/200 mL premix        1,000 mg 200  mL/hr over 60 Minutes Intravenous  Once 05/14/20 1923 05/14/20 2219    .   Subjective  During my visit the patient is lying patiently in his bed. He is cooperated, oriented and pleasant. No new complaints.  Objective   Vitals:  Vitals:   05/15/20 1628 05/15/20 1708  BP: (!) 151/99 (!) 174/97  Pulse: 84 88  Resp: 18 19  Temp: 98 F (36.7 C) 98.5 F (36.9 C)  SpO2: 99% 100%   Exam:  Constitutional:  . The patient is awake, alert, and oriented x 3. No acute distress. Respiratory:  . No increased work of breathing. . No wheezes, rales, or rhonchi . No tactile fremitus Cardiovascular:  . Regular rate and rhythm . No murmurs, ectopy, or gallups. . No lateral PMI. No thrills. Abdomen:  . Abdomen is soft, non-tender, non-distended . No hernias, masses, or organomegaly . Normoactive bowel sounds.  Musculoskeletal:   . No cyanosis, clubbing, or edema Skin:  . No rashes, lesions, ulcers . palpation of skin: no induration or nodules Neurologic:  . CN 2-12 intact . Sensation all 4 extremities intact Psychiatric:  . Mental status o Mood, affect appropriate o Orientation to person, place, time  . judgment and insight appear intact  I have personally reviewed the following:   Today's Data  . Vitals, Procalcitonin, Lactic acid, Ammonia, CMP, CBC  Micro Data  . Blood cultures x 2: No growth  Imaging  . CT head . MRI Brain  Cardiology Data  . EKG . Echocardiogram pending  Other Data  . EEG  Scheduled Meds: . buprenorphine-naloxone  1 tablet Sublingual Daily  . citalopram  10 mg Oral Daily  . enoxaparin (LOVENOX) injection  40 mg Subcutaneous Q24H  . ferrous sulfate  325 mg Oral Q breakfast  . gabapentin  800 mg Oral TID  . insulin aspart  0-15 Units Subcutaneous TID WC  . insulin aspart  0-5 Units Subcutaneous QHS  . insulin detemir  10 Units Subcutaneous Q2200  . Ledipasvir-Sofosbuvir  1 tablet Oral Daily  . multivitamin with minerals  1 tablet Oral Daily  . OLANZapine  5 mg Oral QHS  . tamsulosin  0.8 mg Oral Daily  . [START ON 05/23/2020] thiamine injection  100 mg Intravenous Daily   Continuous Infusions: . sodium chloride 125 mL/hr at 05/15/20 0454  . thiamine injection     Followed by  . [START ON 05/17/2020] thiamine injection    . vancomycin Stopped (05/15/20 1613)    Active Problems:   Sepsis ([place])   Syncope   LOS: 0 days   A & P  Altered level of consciousness: The patient has had swings of behavior between being cooperative and lucid to being confused, agitated aond combative. Concern for seizure activity. I have discussed the patient with dr.   Sepsis: Doubt. Leukocytosis - due to demargination/reactive. Lactic acid - due to syncope/seizure/cocaine abuse. Resolved. No fever, hypotension, or likely source of infection. Procalcitonin negative. Antibiotics stopped.    Syncope/Seizure/AlteredMentalStatus/Post ictal period: Seizure would explain lactic acidosis, decreased level of consciousness, and leukocytosis. Possibly due to cocaine abuse and/or meningioma in the f. Echocardiogram ordered to evaluate for syncope. The patient is being monitored on telemetry. I have discussed the patient with Dr. Lorrin Goodell. He will consult and he has ordered an EEG. He recommends that Keppra be initiated if the patient has further episodes.  Insulin-dependent diabetes mellitus-resumed home insulin -Insulin SSI -Checking A1c  HIV: Continue antiretroviral regimen.   DVT prophylaxis: enoxaparin  Code Status: full code Diet: Carb modified/heart healthy Family Communication: he does not want anyone updated Disposition Plan: Pending clinical course   Armanii Urbanik, DO [facility] Direct contact: see [email or web address]  7PM-7AM contact night coverage as above 05/15/2020, 6:39 PM  LOS: 0 days

## 2020-05-15 NOTE — ED Notes (Signed)
Pt reoriented, cleaned of blood and urine.  Pt placed in hospital bed, new gown provided.  Pt eating what remains of lunch at this time.  Nurse Tech will remain near pt room to assist with reorientation and preventing fall.

## 2020-05-15 NOTE — Progress Notes (Incomplete)
Cross Cover Notified by RN patient difficult to arouse around 2000this evening      Addenda  ADDENDUM REPORT: 05/15/2020 23:20  ADDENDUM: Study discussed by telephone with provider Sharion Settler on 05/15/2020 at 2313 hours. Regarding the small bilateral subdural hematomas which appear stable since 05/14/2020, I advised repeat noncontrast Head CT on 05/16/2020.   Electronically Signed   By: Genevie Ann M.D.   On: 05/15/2020 23:20   Signed by Gaspar Cola, MD on 05/15/2020 11:22 PM  Narrative & Impression  CLINICAL DATA:  74 year old male with altered mental status, right frontal extra-axial mass on noncontrast head CT yesterday.  EXAM: MRI HEAD WITHOUT AND WITH CONTRAST  TECHNIQUE: Multiplanar, multiecho pulse sequences of the brain and surrounding structures were obtained without and with intravenous contrast.  CONTRAST:  48mL GADAVIST GADOBUTROL 1 MMOL/ML IV SOLN  COMPARISON:  Head CT 05/14/2020.  FINDINGS: Brain: Confirmed extra-axial (series 9, image 10) dural based and fairly homogeneously enhancing mass along the anterior right frontal convexity encompassing 21 by 40 by 66 mm (AP by transverse by CC). Associated mild mass effect on the anterior right frontal lobe and possibly mild leptomeningeal thickening (series 20, image 11) but no definite cerebral edema.  Additionally, the mass broadly involves the anterior falx and appears to occlude the anterior most portion of the superior sagittal sinus (series 19, image 24 and series 17, image 23).  Additional fairly smooth pachymeningeal thickening and enhancement in the right anterior cranial fossa along the right inferior frontal gyrus (series 18, image 90). No other abnormal enhancement or extra-axial mass identified.  However, there is a trace left side subdural hematoma (series 11, image 28) up to 2 mm in thickness. And similar small right side subdural hematoma which is nearly isointense to CSF  measuring 2-3 mm in thickness (series 11, image 32 and series 17, image 13).  No subsequent midline shift. Basilar cisterns remain normal. No ventriculomegaly. No intraventricular hemorrhage identified.  Furthermore there is a small 5 mm focus of abnormal diffusion in the left frontal operculum with focal T2 and FLAIR hyperintensity which is more suggestive of a small cerebral contusion than a small acute infarct. No other restricted diffusion.  Underlying innumerable foci of chronic microhemorrhage throughout the bilateral brain (series 15 image 40) relatively sparing the deep gray nuclei and brainstem. Some deep cerebellar nuclei involvement.  Additional Patchy and confluent nonspecific bilateral cerebral white matter T2 and FLAIR hyperintensity, with occasional small areas of cystic encephalomalacia most resembling chronic white matter lacunar infarcts (series 11, image 39). Deep gray matter nuclei, brainstem signal is within normal limits for age. Cervicomedullary junction and pituitary are within normal limits.  Vascular: Major intracranial vascular flow voids are preserved. The anterior superior sagittal sinus appears occluded secondary to involvement by meningioma (series 17, image 23), but the mid and posterior aspects of that sinus, as well as the other major dural venous structures appear to remain patent.  Skull and upper cervical spine: Negative for age visible cervical spine. Skull bone marrow signal is normal.  Sinuses/Orbits: Negative.  Other: Mastoids are clear. Visible internal auditory structures appear normal. Chronic appearing right posterior scalp soft tissue scarring.  IMPRESSION: 1. Small or trace bilateral subdural hematomas (up to 3 mm on the right and 2 mm on the left) are likely posttraumatic along with evidence of a small left frontal operculum cerebral contusion. No midline shift. Basilar cisterns remain normal.  2. Superimposed up to  6 cm long axis right anterior frontal convexity Meningioma  with extension to the midline, invasion and occlusion of the anterior-most superior sagittal sinus.  3. 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 Superimposed chronic white matter signal changes, likely small vessel disease related.  Electronically Signed: By: Genevie Ann M.D. On: 05/15/2020 22:57

## 2020-05-15 NOTE — ED Notes (Signed)
To room to hang Vancomycin at this time.  Pt awakes and begins to get up and slide to end of bed.  Attempted to redirect pt to lie back down in bed.  Pt begins to tell RN that he is going to get up and go to his bathroom.  RN explained to pt that there is no BR in his room and he needs to use the urinal.  Pt becomes very frustrated with RN and will not be redirected at this time.  Pt insists that he is going to get up and go to the bathroom.  RN attempts to disconnect pts IV and monitors in an attempt to ambulate pt to BR and pt begins to swing at RN.

## 2020-05-15 NOTE — Progress Notes (Signed)
*  PRELIMINARY RESULTS* Echocardiogram 2D Echocardiogram has been performed.  Lance [name] 05/15/2020, 9:51 AM

## 2020-05-15 NOTE — ED Notes (Signed)
Pt assisted to BR and continues to argue with tech about where he is and why he is here.  PT has intermittent periods of agitation coupled with periods of compliance and cooperation.  Pt currently unwilling to have RN restart his IV.

## 2020-05-15 NOTE — ED Notes (Signed)
Pt in visual field of c pod nurses station, gurney lowest position and pt  On bed alarm, pt explained to call prior to trying to get up.

## 2020-05-15 NOTE — Consult Note (Signed)
NEUROLOGY CONSULTATION NOTE   Date of service: May 15, 2020 Patient Name: Lance [name] MRN:  [id number] DOB:  [birth year] Reason for consult: "Rapid shifts in mentation" _ _ _   _ __   _ __ _ _  __ __   _ __   __ _  History of Present Illness  Lance [name] is a 74 y.o. male with PMH significant for DM2, Hep C, Schizophrenia who is admitted with being found down by his neighbors. Patient has no recollection of the event that led to this. He was admitted for syncope workup.  Today, he was noted to by hospitalist to be oriented, conversant with normal affect. RN later noted patient to be significantly confused, agitated, insisting open the bathroom door, swinging at staff, unable to redirect. About 40 mins later, he was improved and back to his baseline. He had a CTH with a R frontal meningioma. Neurology was consulted to assess for potential seizures.  Workup here with Urine drug screen positive for cocaine. CBC with leukocytosis with WBC count of 15.2 on Cefepime and Vanc. Rest of the labs with Ammonia of 40, EtOH levels < 10.   Lance [name] has no recollection of the events leading upto his admission. He reports that his family brought him in and has no idea what led upto it. He endorses using cocaine and binge drinking alcohol. Denies drinking every day, maybe about once every 2 weeks. Denies using any other recreational substances. Denies any personal hx of seizures, endorses that his brother had seizures when he was an adult. No hx of strokes, no hx of CNS infection or CNS surgery.   ROS   Constitutional Denies weight loss, fever and chills.  HEENT Denies changes in vision and hearing.  Respiratory Denies SOB and cough.  CV Denies palpitations and CP  GI Denies abdominal pain, nausea, vomiting and diarrhea.  GU Denies dysuria and urinary frequency.  MSK Denies myalgia and joint pain.  Skin Denies rash and pruritus.  Neurological Denies headache and syncope.  Psychiatric  Denies recent changes in mood. Denies anxiety and depression.   Past History   Past Medical History:  Diagnosis Date  . Diabetes mellitus without complication ([place])   . Hepatitis C   . Schizophrenia [facility])    Past Surgical History:  Procedure Laterality Date  . ESOPHAGOGASTRODUODENOSCOPY (EGD) WITH PROPOFOL N/A 10/10/2016   Procedure: ESOPHAGOGASTRODUODENOSCOPY (EGD) WITH PROPOFOL;  Surgeon: Lucilla Lame, MD;  Location: [facility] ENDOSCOPY;  Service: Endoscopy;  Laterality: N/A;  . HERNIA REPAIR    . VISCERAL ARTERY INTERVENTION N/A 10/11/2016   Procedure: Visceral Artery Intervention;  Surgeon: Algernon Huxley, MD;  Location: [place] CV LAB;  Service: Cardiovascular;  Laterality: N/A;   Family History  Problem Relation Age of Onset  . Diabetes Sister    Social History   Socioeconomic History  . Marital status: Widowed    Spouse name: Not on file  . Number of children: Not on file  . Years of education: Not on file  . Highest education level: Not on file  Occupational History  . Not on file  Tobacco Use  . Smoking status: Current Every Day Smoker    Packs/day: 0.25  . Smokeless tobacco: Never Used  Vaping Use  . Vaping Use: Never used  Substance and Sexual Activity  . Alcohol use: Not Currently    Alcohol/week: 10.0 standard drinks    Types: 10 Shots of liquor per week  . Drug use: No  .  Sexual activity: Yes    Birth control/protection: None  Other Topics Concern  . Not on file  Social History Narrative  . Not on file   Social Determinants of Health   Financial Resource Strain:   . Difficulty of Paying Living Expenses: Not on file  Food Insecurity:   . Worried About Charity fundraiser in the Last Year: Not on file  . Ran Out of Food in the Last Year: Not on file  Transportation Needs:   . Lack of Transportation (Medical): Not on file  . Lack of Transportation (Non-Medical): Not on file  Physical Activity:   . Days of Exercise per Week: Not on file  . Minutes of  Exercise per Session: Not on file  Stress:   . Feeling of Stress : Not on file  Social Connections:   . Frequency of Communication with Friends and Family: Not on file  . Frequency of Social Gatherings with Friends and Family: Not on file  . Attends Religious Services: Not on file  . Active Member of Clubs or Organizations: Not on file  . Attends Archivist Meetings: Not on file  . Marital Status: Not on file   No Known Allergies  Medications  (Not in a hospital admission)    Vitals   Vitals:   05/15/20 0600 05/15/20 0615 05/15/20 0630 05/15/20 0800  BP: (!) 176/94   (!) 163/93  Pulse: 62 (!) 57 87 (!) 58  Resp:    18  Temp:      TempSrc:      SpO2: 100% 100% 97% 100%  Weight:      Height:         Body mass index is 26.58 kg/Lance.  Physical Exam   General: Laying comfortably in bed; in no acute distress.  HENT: Normal oropharynx and mucosa. Normal external appearance of ears and nose.  Neck: Supple, no pain or tenderness  CV: No JVD. No peripheral edema.  Pulmonary: Symmetric Chest rise. Normal respiratory effort.  Abdomen: Soft to touch, non-tender.  Ext: No cyanosis, edema, or deformity  Skin: No rash. Normal palpation of skin.   Musculoskeletal: Normal digits and nails by inspection. No clubbing.   Neurologic Examination  Mental status/Cognition: Alert, oriented to self, place, but not to month and year, poor attention. Easily distracted. Poor attention limits evaluation of memory and thought process. Does appear to have underlying cognitive deficit. Speech/language: Fluent, comprehension intact, object naming intact, repetition intact.  Cranial nerves:   CN II Pupils equal and reactive to light, no VF deficits    CN III,IV,VI EOM intact, no gaze preference or deviation, no nystagmus    CN V normal sensation in V1, V2, and V3 segments bilaterally    CN VII no asymmetry, no nasolabial fold flattening    CN VIII normal hearing to speech    CN IX & X  normal palatal elevation, no uvular deviation    CN XI 5/5 head turn and 5/5 shoulder shrug bilaterally    CN XII midline tongue protrusion    Motor:  Muscle bulk: normal, tone normal, pronator drift none tremor none Mvmt Root Nerve  Muscle Right Left Comments  SA C5/6 Ax Deltoid 5 5   EF C5/6 Mc Biceps 5 5   EE C6/7/8 Rad Triceps 5 5   WF C6/7 Med FCR 5 5   WE C7/8 PIN ECU 5 5   F Ab C8/T1 U ADM/FDI 5 5   HF L1/2/3 Fem  Illopsoas 5 5   KE L2/3/4 Fem Quad 5 5   DF L4/5 D Peron Tib Ant 5 5   PF S1/2 Tibial Grc/Sol 5 5    Reflexes:  Right Left Comments  Pectoralis      Biceps (C5/6) 1 1   Brachioradialis (C5/6) 1 1    Triceps (C6/7) 1 1    Patellar (L3/4) 1 1    Achilles (S1) 1 1    Hoffman      Plantar     Jaw jerk    Sensation:  Light touch Intact throughout   Pin prick    Temperature    Vibration   Proprioception    Coordination/Complex Motor:  - Finger to Nose intact BL - Heel to shin unable to get him to understand how to do it. - Rapid alternating movement are normal - Gait: deferred.  Labs   CBC:  Recent Labs  Lab 05/14/20 1833  WBC 15.2*  NEUTROABS 13.2*  HGB 13.8  HCT 40.3  MCV 88.8  PLT 782    Basic Metabolic Panel:  Lab Results  Component Value Date   NA 138 05/14/2020   K 4.1 05/14/2020   CO2 26 05/14/2020   GLUCOSE 381 (H) 05/14/2020   BUN 12 05/14/2020   CREATININE 1.16 05/14/2020   CALCIUM 9.3 05/14/2020   GFRNONAA >60 05/14/2020   GFRAA >60 10/28/2019   Lipid Panel: No results found for: LDLCALC HgbA1c:  Lab Results  Component Value Date   HGBA1C 10.8 (H) 05/14/2020   Urine Drug Screen:     Component Value Date/Time   LABOPIA NONE DETECTED 05/14/2020 1720   COCAINSCRNUR POSITIVE (A) 05/14/2020 1720   LABBENZ NONE DETECTED 05/14/2020 1720   AMPHETMU NONE DETECTED 05/14/2020 1720   THCU NONE DETECTED 05/14/2020 1720   LABBARB NONE DETECTED 05/14/2020 1720    Alcohol Level     Component Value Date/Time   ETH <10  05/14/2020 1833    CT Head without contrast: 1. Right frontal extra-axial mass, without significant mass effect on the brain parenchyma, most compatible with meningioma. This could be confirmed with nonemergent follow-up MRI. 2. Age-indeterminate small vessel ischemic changes within the white matter, likely chronic.  MRI Brain pending  rEEG: pending  Impression   Lance [name] is a 74 y.o. male with PMH significant for DM2, Hep C, Schizophrenia who is admitted with being found down by his neighbors. Workup with leukocytosis, positive UDS for cocaine. His neurologic examination is notable for cognitive deficit with poor attention out of proportion to his somnolence, otherwise no focal deficit. Given the location of his meningioma over the R frontal lobe and concern for cognitive deficits, it is reasonable to get an MRI Brain with and without contrast to evaluate for underlying mass effect, subclinical seizures. It is not uncommon for frontal lobe seizures to presents as episodes of behavioral arrest.  Recommendations  - I ordered MRI Brain with and without contrast - I ordered rEEG - I ordered Vit B1 levels, followed by Thiamine high dose replacement - I ordered Vit B12 levels, MMA, Folate, TSH, RPR, HIV Ab. - We will continue to follow along. ______________________________________________________________________   Thank you for the opportunity to take part in the care of this patient. If you have any further questions, please contact the neurology consultation attending.  Signed,  [place] Pager Number [phone] _ _ _   _ __   _ __ _ _  __ __   _  __   __ _  

## 2020-05-15 NOTE — Progress Notes (Signed)
Cross Cover Notified by RN patient difficult to arouse around 2000 this evening.  At bedside patient easily awakening with sternal rub.  He was not able to state his full name or where he was, slightly agitated.  No consistency in following commands but no focal neuro deficits in his movements were noted.  Requested MRI to do previously ordered routine MRI study as stat.    Addenda  ADDENDUM REPORT: 05/15/2020 23:20  ADDENDUM: Study discussed by telephone with provider Sharion Settler on 05/15/2020 at 2313 hours. Regarding the small bilateral subdural hematomas which appear stable since 05/14/2020, I advised repeat noncontrast Head CT on 05/16/2020.   Electronically Signed   By: Genevie Ann M.D.   On: 05/15/2020 23:20   Signed by Gaspar Cola, MD on 05/15/2020 11:22 PM  Narrative & Impression  CLINICAL DATA:  74 year old male with altered mental status, right frontal extra-axial mass on noncontrast head CT yesterday.  EXAM: MRI HEAD WITHOUT AND WITH CONTRAST  TECHNIQUE: Multiplanar, multiecho pulse sequences of the brain and surrounding structures were obtained without and with intravenous contrast.  CONTRAST:  68mL GADAVIST GADOBUTROL 1 MMOL/ML IV SOLN  COMPARISON:  Head CT 05/14/2020.  FINDINGS: Brain: Confirmed extra-axial (series 9, image 10) dural based and fairly homogeneously enhancing mass along the anterior right frontal convexity encompassing 21 by 40 by 66 mm (AP by transverse by CC). Associated mild mass effect on the anterior right frontal lobe and possibly mild leptomeningeal thickening (series 20, image 11) but no definite cerebral edema.  Additionally, the mass broadly involves the anterior falx and appears to occlude the anterior most portion of the superior sagittal sinus (series 19, image 24 and series 17, image 23).  Additional fairly smooth pachymeningeal thickening and enhancement in the right anterior cranial fossa along the right inferior  frontal gyrus (series 18, image 90). No other abnormal enhancement or extra-axial mass identified.  However, there is a trace left side subdural hematoma (series 11, image 28) up to 2 mm in thickness. And similar small right side subdural hematoma which is nearly isointense to CSF measuring 2-3 mm in thickness (series 11, image 32 and series 17, image 13).  No subsequent midline shift. Basilar cisterns remain normal. No ventriculomegaly. No intraventricular hemorrhage identified.  Furthermore there is a small 5 mm focus of abnormal diffusion in the left frontal operculum with focal T2 and FLAIR hyperintensity which is more suggestive of a small cerebral contusion than a small acute infarct. No other restricted diffusion.  Underlying innumerable foci of chronic microhemorrhage throughout the bilateral brain (series 15 image 40) relatively sparing the deep gray nuclei and brainstem. Some deep cerebellar nuclei involvement.  Additional Patchy and confluent nonspecific bilateral cerebral white matter T2 and FLAIR hyperintensity, with occasional small areas of cystic encephalomalacia most resembling chronic white matter lacunar infarcts (series 11, image 39). Deep gray matter nuclei, brainstem signal is within normal limits for age. Cervicomedullary junction and pituitary are within normal limits.  Vascular: Major intracranial vascular flow voids are preserved. The anterior superior sagittal sinus appears occluded secondary to involvement by meningioma (series 17, image 23), but the mid and posterior aspects of that sinus, as well as the other major dural venous structures appear to remain patent.  Skull and upper cervical spine: Negative for age visible cervical spine. Skull bone marrow signal is normal.  Sinuses/Orbits: Negative.  Other: Mastoids are clear. Visible internal auditory structures appear normal. Chronic appearing right posterior scalp soft tissue scarring.   IMPRESSION: 1. Small or  trace bilateral subdural hematomas (up to 3 mm on the right and 2 mm on the left) are likely posttraumatic along with evidence of a small left frontal operculum cerebral contusion. No midline shift. Basilar cisterns remain normal.  2. 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  3. 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 Superimposed chronic white matter signal changes, likely small vessel disease related.  Electronically Signed: By: Genevie Ann M.D. On: 05/15/2020 22:57    After MRI neuro exam slightly improved with patients interactivity but still slightly off with response to requests as taki longer to comprehend and perform.  Still showing impulsivity at times.   He was moved to stepdown care in the ICU to allow for more frequent neuro exams.  CT without contrast ordered for later today as recommended by Dr. Nevada Crane.  Dr. Lacinda Axon with neurosurgery was also contacted to make him aware of patient in case of neuro changes and need for further evaluation from his specialty

## 2020-05-15 NOTE — ED Notes (Signed)
Neurologist to room at this time.

## 2020-05-16 ENCOUNTER — Inpatient Hospital Stay: Payer: Medicare HMO

## 2020-05-16 DIAGNOSIS — R410 Disorientation, unspecified: Secondary | ICD-10-CM

## 2020-05-16 DIAGNOSIS — G9340 Encephalopathy, unspecified: Secondary | ICD-10-CM

## 2020-05-16 LAB — BASIC METABOLIC PANEL
Anion gap: 8 (ref 5–15)
BUN: 14 mg/dL (ref 8–23)
CO2: 25 mmol/L (ref 22–32)
Calcium: 8.6 mg/dL — ABNORMAL LOW (ref 8.9–10.3)
Chloride: 106 mmol/L (ref 98–111)
Creatinine, Ser: 0.98 mg/dL (ref 0.61–1.24)
GFR, Estimated: >60 mL/min (ref >60)
Glucose, Bld: 79 mg/dL (ref 70–99)
Potassium: 3.3 mmol/L — ABNORMAL LOW (ref 3.5–5.1)
Sodium: 139 mmol/L (ref 135–145)

## 2020-05-16 LAB — CBC WITH DIFFERENTIAL/PLATELET
Abs Immature Granulocytes: 0.08 10*3/uL — ABNORMAL HIGH (ref 0.00–0.07)
Basophils Absolute: 0 10*3/uL (ref 0.0–0.1)
Basophils Relative: 0 %
Eosinophils Absolute: 0.1 10*3/uL (ref 0.0–0.5)
Eosinophils Relative: 1 %
HCT: 35.5 % — ABNORMAL LOW (ref 39.0–52.0)
Hemoglobin: 11.9 g/dL — ABNORMAL LOW (ref 13.0–17.0)
Immature Granulocytes: 1 %
Lymphocytes Relative: 21 %
Lymphs Abs: 2.1 10*3/uL (ref 0.7–4.0)
MCH: 30.4 pg (ref 26.0–34.0)
MCHC: 33.5 g/dL (ref 30.0–36.0)
MCV: 90.6 fL (ref 80.0–100.0)
Monocytes Absolute: 0.8 10*3/uL (ref 0.1–1.0)
Monocytes Relative: 8 %
Neutro Abs: 7.1 10*3/uL (ref 1.7–7.7)
Neutrophils Relative %: 69 %
Platelets: 200 10*3/uL (ref 150–400)
RBC: 3.92 MIL/uL — ABNORMAL LOW (ref 4.22–5.81)
RDW: 13.1 % (ref 11.5–15.5)
WBC: 10.1 10*3/uL (ref 4.0–10.5)
nRBC: 0 % (ref 0.0–0.2)

## 2020-05-16 LAB — RPR: RPR Ser Ql: NONREACTIVE

## 2020-05-16 LAB — GLUCOSE, CAPILLARY
Glucose-Capillary: 102 mg/dL — ABNORMAL HIGH (ref 70–99)
Glucose-Capillary: 118 mg/dL — ABNORMAL HIGH (ref 70–99)
Glucose-Capillary: 251 mg/dL — ABNORMAL HIGH (ref 70–99)
Glucose-Capillary: 252 mg/dL — ABNORMAL HIGH (ref 70–99)
Glucose-Capillary: 276 mg/dL — ABNORMAL HIGH (ref 70–99)
Glucose-Capillary: 61 mg/dL — ABNORMAL LOW (ref 70–99)

## 2020-05-16 MED ORDER — POTASSIUM CHLORIDE CRYS ER 20 MEQ PO TBCR
40.0000 meq | EXTENDED_RELEASE_TABLET | Freq: Two times a day (BID) | ORAL | Status: AC
  Administered 2020-05-16 – 2020-05-17 (×2): 40 meq via ORAL
  Filled 2020-05-16 (×2): qty 2

## 2020-05-16 MED ORDER — CHLORHEXIDINE GLUCONATE CLOTH 2 % EX PADS
6.0000 | MEDICATED_PAD | Freq: Every day | CUTANEOUS | Status: DC
  Administered 2020-05-17 – 2020-06-12 (×24): 6 via TOPICAL

## 2020-05-16 MED ORDER — HEPARIN SODIUM (PORCINE) 5000 UNIT/ML IJ SOLN: 5000.0000 [IU] | Freq: Three times a day (TID) | INTRAMUSCULAR | Status: DC

## 2020-05-16 MED ORDER — LEVETIRACETAM IN NACL 1000 MG/100ML IV SOLN
1000.0000 mg | Freq: Once | INTRAVENOUS | Status: AC
  Administered 2020-05-16: 1000 mg via INTRAVENOUS
  Filled 2020-05-16: qty 100

## 2020-05-16 MED ORDER — DIVALPROEX SODIUM 500 MG PO DR TAB
500.0000 mg | DELAYED_RELEASE_TABLET | Freq: Two times a day (BID) | ORAL | Status: DC
  Administered 2020-05-16 – 2020-05-21 (×11): 500 mg via ORAL
  Filled 2020-05-16 (×13): qty 1

## 2020-05-16 MED ORDER — HALOPERIDOL LACTATE 5 MG/ML IJ SOLN
2.0000 mg | Freq: Four times a day (QID) | INTRAMUSCULAR | Status: DC | PRN
  Administered 2020-05-16: 2 mg via INTRAVENOUS
  Filled 2020-05-16: qty 1

## 2020-05-16 MED ORDER — INSULIN ASPART 100 UNIT/ML ~~LOC~~ SOLN
0.0000 [IU] | SUBCUTANEOUS | Status: DC
  Administered 2020-05-16 – 2020-05-17 (×3): 11 [IU] via SUBCUTANEOUS
  Filled 2020-05-16 (×2): qty 1

## 2020-05-16 MED ORDER — INSULIN ASPART 100 UNIT/ML ~~LOC~~ SOLN: 0.0000 [IU] | SUBCUTANEOUS | Status: DC

## 2020-05-16 MED ORDER — DEXTROSE 50 % IV SOLN
INTRAVENOUS | Status: AC
  Administered 2020-05-16: 25 mL via INTRAVENOUS
  Filled 2020-05-16: qty 50

## 2020-05-16 MED ORDER — CYANOCOBALAMIN 1000 MCG/ML IJ SOLN
1000.0000 ug | Freq: Once | INTRAMUSCULAR | Status: AC
  Administered 2020-05-16: 1000 ug via INTRAMUSCULAR
  Filled 2020-05-16: qty 1

## 2020-05-16 MED ORDER — DEXTROSE 50 % IV SOLN: 25.0000 mL | Freq: Once | INTRAVENOUS | Status: AC

## 2020-05-16 MED ORDER — VITAMIN B-12 1000 MCG PO TABS
1000.0000 ug | ORAL_TABLET | Freq: Every day | ORAL | Status: DC
  Administered 2020-05-17 – 2020-06-15 (×29): 1000 ug via ORAL
  Filled 2020-05-16 (×29): qty 1

## 2020-05-16 MED ORDER — HALOPERIDOL LACTATE 5 MG/ML IJ SOLN: 1.0000 mg | INTRAMUSCULAR | Status: DC | PRN

## 2020-05-16 NOTE — Progress Notes (Signed)
Pt refused EEG °

## 2020-05-16 NOTE — Progress Notes (Signed)
2000- Patient transported to CT in ICU on cardiac monitor. Tolerated CT well. Patient is cooperative at this time. After returning from [place] put order for CTA of chest, patient is refusing at this time. Hassan Rowan NP aware, charge RN and CT staff aware.

## 2020-05-16 NOTE — Procedures (Signed)
Pt refused eeg test

## 2020-05-16 NOTE — Progress Notes (Signed)
PT noncompliant with staff.  IV access obtained and pt taken to MRI.  RN notified of MRI results and pt to be moved to ICU

## 2020-05-16 NOTE — Progress Notes (Signed)
Hospitalist and neurology aware that patient is refusing everything. Pt keeps climbing out of bed and urinating on floor. Sitter at bedside but unable to redirect patient. IV Haldol given to patient after he was safely assisted back to bed. Pt resting with eyes closed.

## 2020-05-16 NOTE — Progress Notes (Addendum)
PROGRESS NOTE    Lance [name]  [name] DOB: 02-26-1946 DOA: 05/14/2020 PCP: Theotis Burrow, MD  Brief Narrative:  HPI per Dr. Rupert Stacks on 05/14/20 Lance [name] is a 74 y.o. male with medical history significant for hepatitis C was treated with Harvoni, insulin-dependent diabetes mellitus presented to the emergency department for chief concerns of being found unresponsive/altered mentation by his neighbors or friends.  No one is at bedside on my evaluation.  Patient does not want anyone to be called.  He does not know what happened.  He reports that he ate dinner today.  He denies anything out of the ordinary.  He denies drug use on day of admission.  He endorses compliance with medications.  ROS was negative for headache, vision changes, chest pain, shortness of breath, nausea, vomiting, dysphagia, fever, chills, dysuria, hematuria, diarrhea, weakness in legs, appetite changes, unintentional weight loss.   Social history: lives by himself. Endorses cocaine use and that the last time he used cocaine was about 1 week ago. He lasted drink etoh 1 week ago.   ED Course: Discussed with ED provider  **Interim History  Patient has been intermittently lucid and today he was significantly confused.  Wanted to leave and threatened to leave [place].  Became very agitated and noncompliant and given Haldol.  Psychiatry was consulted for his behavioral disturbances.  Psychiatry recommended inpatient psychiatric hospitalization once patient is medically stable.  Patient refused EEG this morning and will need to be done when he is more calm.  Neurology recommending changing his Keppra to Depakote  Assessment & Plan:   Principal Problem:   Subacute delirium Active Problems:   Sepsis ([place])   Syncope  Acute Encephalopathy  Subacute Delirium  -The patient has had swings of behavior between being cooperative and lucid to being confused, agitated aond combative.   -Concern for seizure activity. -Has been extremely combative and agitated and very confused and threatening to leave.  EEG was ordered however was canceled as patient refused -Keppra was given to the patient however this was changed to Depakote 500 mg p.o. twice daily.  EEG will be reattempted when he is more calm Patient's vitamin B12 levels were normal and neurology recommending starting p.o. cyanocobalamin 1000 mg p.o. daily and also recommending continuing high-dose IV thiamine replacement protocol Patient is also getting p.o. multivitamin -Head CT Done and showed "Right frontal extra-axial mass, without significant mass effect on the brain parenchyma, most compatible with meningioma. This could be confirmed with nonemergent follow-up MRI. Age-indeterminate small vessel ischemic changes within the white matter, likely chronic." -MRI done and showed "Small or trace bilateral subdural hematomas (up to 3 mm on the right and 2 mm on the left) are likely posttraumatic along with evidence of a small left frontal operculum cerebral contusion. No midline shift. Basilar cisterns remain normal. 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 Superimposed chronic white matter signal changes, likely small vessel disease related." -Neurosurgery Dr. Lacinda Axon evaluated the scans and he feels that all the MRI findings are chronic in nature and that the microhemorrhages are not true hemorrhages.  Dr. Lacinda Axon was not impressed by the subdural spaces on the CT scan recommends no repeat head CT from his perspective and feels that he can see the patient in outpatient setting for his meningioma -Patient's TSH was  1.225, RPR was nonreactive and vitamin B12 levels 294 as well as folate level being 30.1 -Because of his  behavioral disturbances psychiatry was consulted and he was given 2 mg of IV Haldol but Dr. Weber Cooks is recommending changing to 1 mg every 4 hours as needed for behavioral disturbances  Sepsis ruled out he had SIRS from seizure - Doubt. Leukocytosis - due to demargination/reactive.  -Lactic acid - due to syncope/seizure/cocaine abuse.  -Resolved. No fever, hypotension, or likely source of infection.  -Procalcitonin negative.  -Antibiotics stopped.   Syncope/Seizure with Post ictal period -Seizure would explain lactic acidosis, decreased level of consciousness, and leukocytosis.  -Possibly due to cocaine abuse and/or meningioma - Echocardiogram ordered to evaluate for syncope. The patient is being monitored on telemetry.  -Dr. Benny Lennert discussed the patient with Dr. Lorrin Goodell. He will consult and he has ordered an EEG. He recommends that Keppra be initiated if the patient has further episodes. -Keppra has been changed to p.o. Depakote as above  Schizophrenia and history of bipolar disorder -Consulted Psych For further evaluation -C/w Olanzapine 5 mg p.o. nightly and also continue the haloperidol as above as well as citalopram -Psych recommending Inpatient Psychiatric Admission   Insulin-Dependent Diabetes Mellitus -Resumed home insulin -Insulin SSI -CBG's ranging from 61-118 -Checking A1c and was 10.8   Normocytic Anemia -Patient's Hgb/Hct went 11.9/35.5 -C/w Ferrous Sulfate -Check Anemia Panel  -Continue to Monitor for S/Sx of Bleeding; Currently no overt bleeding -Repeat CBC in AM  Elevated D-dimer/fibrin derivatives -Patient's D-dimer on admission was 1417.44 -Will obtain CTA of the chest but patient is not complaining of shortness of breath or dyspneic. He did have a Syncopal Episode Likely and ? From PE -Obtain lower extremity venous duplex as well  Chronic Pain Syndrome -Continue with Suboxone  History of Hepatitis C -Continue Antiretroviral regimen with  Ledipasvir-Sofosbuvir 90-400 1 tab po BID. -Check LFTs in the morning  Hypokalemia -K+ was 3.3 -Replete po KCl 40 mEQ BID x2 -Continue to Monitor and Replete as Necessary -Repeat CMP in the AM    DVT prophylaxis: SCDs; will be adding heparin 5000 units subcu Code Status: FULL CODE  Family Communication: No family present at bedside  Disposition Plan:   Status is: Inpatient  Remains inpatient appropriate because:Unsafe d/c plan, IV treatments appropriate due to intensity of illness or inability to take PO and Inpatient level of care appropriate due to severity of illness   Dispo: The patient is from: Home              Anticipated d/c is to: Inpatient Pyschiatry Hospitalization when Medically Stable              Anticipated d/c date is: 2 days              Patient currently is not medically stable to d/c.  Consultants:   Neurology  Neurosurgery (Dr. Lacinda Axon) via [place]  Psychiatry    Procedures: None  Antimicrobials:  Anti-infectives (From admission, onward)   Start     Dose/Rate Route Frequency Ordered Stop   05/15/20 1230  Ledipasvir-Sofosbuvir 90-400 MG TABS 1 tablet        1 tablet Oral Daily 05/15/20 1223     05/15/20 0900  vancomycin (VANCOREADY) IVPB 750 mg/150 mL        750 mg 150 mL/hr over 60 Minutes Intravenous Every 12 hours 05/14/20 2251     05/15/20 0800  ceFEPIme (MAXIPIME) 2 g in sodium chloride 0.9 % 100 mL IVPB  Status:  Discontinued        2 g 200 mL/hr over 30 Minutes Intravenous Every 12 hours 05/14/20 2250 05/15/20 1321   05/14/20 2300  vancomycin (VANCOCIN) IVPB 1000 mg/200 mL premix        1,000 mg 200 mL/hr over 60 Minutes Intravenous  Once 05/14/20 2250 05/15/20 0156   05/14/20 2245  ceFEPIme (MAXIPIME) 2 g in sodium chloride 0.9 % 100 mL IVPB  Status:  Discontinued        2 g 200 mL/hr over 30 Minutes Intravenous  Once 05/14/20 2244 05/14/20 2248   05/14/20 2245  vancomycin (VANCOCIN) IVPB 1000 mg/200 mL premix  Status:  Discontinued         1,000 mg 200 mL/hr over 60 Minutes Intravenous  Once 05/14/20 2244 05/14/20 2248   05/14/20 2245  metroNIDAZOLE (FLAGYL) tablet 500 mg  Status:  Discontinued        500 mg Oral Every 8 hours 05/14/20 2244 05/14/20 2248   05/14/20 1930  ceFEPIme (MAXIPIME) 2 g in sodium chloride 0.9 % 100 mL IVPB        2 g 200 mL/hr over 30 Minutes Intravenous  Once 05/14/20 1923 05/14/20 2106   05/14/20 1930  metroNIDAZOLE (FLAGYL) IVPB 500 mg        500 mg 100 mL/hr over 60 Minutes Intravenous  Once 05/14/20 1923 05/14/20 2106   05/14/20 1930  vancomycin (VANCOCIN) IVPB 1000 mg/200 mL premix        1,000 mg 200 mL/hr over 60 Minutes Intravenous  Once 05/14/20 1923 05/14/20 2219        Subjective: Seen and examined and he is confused this morning.  Ate all his breakfast but unable to provide any subjective history of where he is.  Is very agitated and has a one-to-one Actuary.  Multiple times he is gotten up and urinated on the floor.  Psychiatry was consulted for his behavioral disturbances.  Patient denies any complaints at this time  Objective: Vitals:   05/16/20 1400 05/16/20 1500 05/16/20 1600 05/16/20 1700  BP:      Pulse:      Resp: (!) 7 (!) 9 (!) 8 11  Temp:      TempSrc:      SpO2:      Weight:      Height:        Intake/Output Summary (Last 24 hours) at 05/16/2020 1723 Last data filed at 05/16/2020 1600 Gross per 24 hour  Intake 490.68 ml  Output 325 ml  Net 165.68 ml   Filed Weights   05/14/20 1714 05/16/20 1333  Weight: 81.6 kg 82 kg   Examination: Physical Exam:  Constitutional: WN/WD chronically ill-appearing African-American male currently ain NAD and appears slightly agitated Eyes:  Lids and conjunctivae normal, sclerae anicteric  ENMT: External Ears, Nose appear normal. Grossly normal hearing. .  Neck: Appears normal, supple, no cervical masses, normal ROM, no appreciable thyromegaly: No JVD Respiratory: Diminished to auscultation bilaterally, no wheezing,  rales, rhonchi or crackles. Normal respiratory effort and patient is not tachypenic. No accessory muscle use.  Unlabored breathing Cardiovascular: RRR, no murmurs / rubs / gallops. S1 and S2 auscultated.  Minimal extremity edema Abdomen: Soft, non-tender, non-distended. Bowel sounds positive.  GU: Deferred. Musculoskeletal: No clubbing / cyanosis of digits/nails. No joint deformity upper and lower extremities.  Skin: No rashes, lesions, ulcers on limited skin evaluation. No induration; Warm and dry.  Neurologic: CN 2-12 grossly intact with no focal deficits. Romberg  sign and cerebellar reflexes not assessed.  Psychiatric: Impaired judgment and insight.  He is not alert and oriented x 3. Normal mood and appropriate affect.   Data Reviewed: I have personally reviewed following labs and imaging studies  CBC: Recent Labs  Lab 05/14/20 1833 05/16/20 0458  WBC 15.2* 10.1  NEUTROABS 13.2* 7.1  HGB 13.8 11.9*  HCT 40.3 35.5*  MCV 88.8 90.6  PLT 215 735   Basic Metabolic Panel: Recent Labs  Lab 05/14/20 1833 05/16/20 0458  NA 138 139  K 4.1 3.3*  CL 103 106  CO2 26 25  GLUCOSE 381* 79  BUN 12 14  CREATININE 1.16 0.98  CALCIUM 9.3 8.6*   GFR: Estimated Creatinine Clearance: 68.1 mL/min (by C-G formula based on SCr of 0.98 mg/dL). Liver Function Tests: Recent Labs  Lab 05/14/20 1833  AST 21  ALT 15  ALKPHOS 120  BILITOT 0.7  PROT 7.9  ALBUMIN 3.6   No results for input(s): LIPASE, AMYLASE in the last 168 hours. Recent Labs  Lab 05/14/20 2003  AMMONIA 40*   Coagulation Profile: Recent Labs  Lab 05/14/20 1833 05/15/20 0441  INR 1.0 1.1   Cardiac Enzymes: Recent Labs  Lab 05/14/20 1821  CKTOTAL 548*   BNP (last 3 results) No results for input(s): PROBNP in the last 8760 hours. HbA1C: Recent Labs    05/14/20 1833  HGBA1C 10.8*   CBG: Recent Labs  Lab 05/15/20 2358 05/16/20 0053 05/16/20 0509 05/16/20 0559 05/16/20 0717  GLUCAP 183* 251* 61* 102*  118*   Lipid Profile: No results for input(s): CHOL, HDL, LDLCALC, TRIG, CHOLHDL, LDLDIRECT in the last 72 hours. Thyroid Function Tests: Recent Labs    05/15/20 1749  TSH 1.225   Anemia Panel: Recent Labs    05/15/20 1749  VITAMINB12 294  FOLATE 13.1   Sepsis Labs: Recent Labs  Lab 05/14/20 1833 05/14/20 2003 05/15/20 0441  PROCALCITON  --   --  <0.10  LATICACIDVEN 3.1* 1.9  --     Recent Results (from the past 240 hour(s))  Urine culture     Status: Abnormal (Preliminary result)   Collection Time: 05/14/20  5:20 PM   Specimen: Urine, Random  Result Value Ref Range Status   Specimen Description   Final    URINE, RANDOM Performed at [facility], [address], [place], [place] [address]    Special Requests   Final    NONE Performed at [facility], [address], [place], [place] [address]    Culture (A)  Final    70,000 COLONIES/mL GRAM NEGATIVE RODS IDENTIFICATION AND SUSCEPTIBILITIES TO FOLLOW Performed at [facility], [address] [place], [place] [address]    Report Status PENDING  Incomplete  Blood culture (routine x 2)     Status: None (Preliminary result)   Collection Time: 05/14/20  6:22 PM   Specimen: BLOOD  Result Value Ref Range Status   Specimen Description BLOOD LEFT HAND  Final   Special Requests   Final    BOTTLES DRAWN AEROBIC AND ANAEROBIC Blood Culture adequate volume   Culture   Final    NO GROWTH 2 DAYS Performed at [facility], [address], [place], [place] [address]    Report Status PENDING  Incomplete  Blood culture (routine x 2)     Status: None (Preliminary result)   Collection Time: 05/14/20  6:23 PM   Specimen: BLOOD  Result Value Ref Range Status  Specimen Description BLOOD RIGHT FA  Final   Special Requests   Final    BOTTLES DRAWN AEROBIC AND ANAEROBIC Blood Culture adequate volume   Culture   Final    NO GROWTH 2 DAYS Performed at [facility], [address], [place], [place] [address]    Report Status PENDING  Incomplete  Resp Panel by RT-PCR (Flu A&B, Covid) Nasopharyngeal Swab     Status: None   Collection Time: 05/14/20  8:03 PM   Specimen: Nasopharyngeal Swab; Nasopharyngeal(NP) swabs in vial transport medium  Result Value Ref Range Status   SARS Coronavirus 2 by RT PCR NEGATIVE NEGATIVE Final    Comment: (NOTE) SARS-CoV-2 target nucleic acids are NOT DETECTED.  The SARS-CoV-2 RNA is generally detectable in upper respiratory specimens during the acute phase of infection. The lowest concentration of SARS-CoV-2 viral copies this assay can detect is 138 copies/mL. A negative result does not preclude SARS-Cov-2 infection and should not be used as the sole basis for treatment or other patient management decisions. A negative result may occur with  improper specimen collection/handling, submission of specimen other than nasopharyngeal swab, presence of viral mutation(s) within the areas targeted by this assay, and inadequate number of viral copies(<138 copies/mL). A negative result must be combined with clinical observations, patient history, and epidemiological information. The expected result is Negative.  Fact Sheet for Patients:  EntrepreneurPulse.com.au  Fact Sheet for Healthcare Providers:  IncredibleEmployment.be  This test is no t yet approved or cleared by the [place] FDA and  has been authorized for detection and/or diagnosis of SARS-CoV-2 by FDA under an Emergency Use Authorization (EUA). This EUA will remain  in effect (meaning this test can be used) for the duration of the COVID-19 declaration under Section 564(b)(1) of the Act, 21 U.S.C.section 360bbb-3(b)(1), unless the authorization is terminated  or revoked sooner.       Influenza A by PCR NEGATIVE NEGATIVE Final   Influenza B by PCR NEGATIVE NEGATIVE Final    Comment: (NOTE) The Xpert Xpress SARS-CoV-2/FLU/RSV plus assay  is intended as an aid in the diagnosis of influenza from Nasopharyngeal swab specimens and should not be used as a sole basis for treatment. Nasal washings and aspirates are unacceptable for Xpert Xpress SARS-CoV-2/FLU/RSV testing.  Fact Sheet for Patients: EntrepreneurPulse.com.au  Fact Sheet for Healthcare Providers: IncredibleEmployment.be  This test is not yet approved or cleared by the [place] FDA and has been authorized for detection and/or diagnosis of SARS-CoV-2 by FDA under an Emergency Use Authorization (EUA). This EUA will remain in effect (meaning this test can be used) for the duration of the COVID-19 declaration under Section 564(b)(1) of the Act, 21 U.S.C. section 360bbb-3(b)(1), unless the authorization is terminated or revoked.  Performed at [facility], [address] [place], [place] [address]     RN Pressure Injury Documentation:     Estimated body mass index is 26.7 kg/m as calculated from the following:   Height as of this encounter: 5\' 9"  (1.753 m).   Weight as of this encounter: 82 kg.  Malnutrition Type:   Malnutrition Characteristics:   Nutrition Interventions:   Radiology Studies: DG Pelvis 1-2 Views  Result Date: 05/15/2020 CLINICAL DATA:  MRI screening EXAM: PELVIS - 1-2 VIEW COMPARISON:  None. FINDINGS: There are degenerative changes of both hips and the spine. There is no acute displaced fracture or dislocation. Small phleboliths project over the patient's pelvis. There is no radiopaque foreign body. IMPRESSION: No radiopaque foreign  body. Degenerative changes of both hips and the spine. Electronically Signed   By: Constance Holster M.D.   On: 05/15/2020 19:39   DG Abd 1 View  Result Date: 05/15/2020 CLINICAL DATA:  MRI clearance EXAM: ABDOMEN - 1 VIEW COMPARISON:  CT dated 03/29/2020 FINDINGS: Multiple metallic coils project over the abdomen related to prior embolization. The bowel gas  pattern is nonobstructive. There are degenerative changes of the lumbar spine and bilateral hips. There are no radiopaque kidney stones. IMPRESSION: 1. Nonobstructive bowel gas pattern. No radiopaque kidney stones identified. 2. Multiple metallic coils involving the abdomen related to prior embolization. Electronically Signed   By: Constance Holster M.D.   On: 05/15/2020 19:56   CT Head Wo Contrast  Result Date: 05/14/2020 CLINICAL DATA:  Altered level of consciousness, hyperglycemia EXAM: CT HEAD WITHOUT CONTRAST TECHNIQUE: Contiguous axial images were obtained from the base of the skull through the vertex without intravenous contrast. COMPARISON:  None. FINDINGS: Brain: Confluent hypodensities in the periventricular white matter are nonspecific, but likely reflect age-indeterminate small vessel ischemic change. No other signs of acute infarct. There is a 4.1 x 1.9 x 2.6 cm extra-axial mass abutting the right frontal lobe and anterior falx, with no significant mass effect on the adjacent brain parenchyma. Overall findings are most consistent with meningioma. No signs of acute hemorrhage. Lateral ventricles and remaining midline structures are unremarkable. No acute extra-axial fluid collections. Vascular: No hyperdense vessel or unexpected calcification. Skull: Normal. Negative for fracture or focal lesion. Sinuses/Orbits: No acute finding. Other: None. IMPRESSION: 1. Right frontal extra-axial mass, without significant mass effect on the brain parenchyma, most compatible with meningioma. This could be confirmed with nonemergent follow-up MRI. 2. Age-indeterminate small vessel ischemic changes within the white matter, likely chronic. Electronically Signed   By: Randa Ngo M.D.   On: 05/14/2020 19:01   MR BRAIN W WO CONTRAST  Addendum Date: 05/15/2020   ADDENDUM REPORT: 05/15/2020 23:20 ADDENDUM: Study discussed by telephone with provider Sharion Settler on 05/15/2020 at 2313 hours. Regarding the small  bilateral subdural hematomas which appear stable since 05/14/2020, I advised repeat noncontrast Head CT on 05/16/2020. Electronically Signed   By: Genevie Ann M.D.   On: 05/15/2020 23:20   Result Date: 05/15/2020 CLINICAL DATA:  74 year old male with altered mental status, right frontal extra-axial mass on noncontrast head CT yesterday. EXAM: MRI HEAD WITHOUT AND WITH CONTRAST TECHNIQUE: Multiplanar, multiecho pulse sequences of the brain and surrounding structures were obtained without and with intravenous contrast. CONTRAST:  50mL GADAVIST GADOBUTROL 1 MMOL/ML IV SOLN COMPARISON:  Head CT 05/14/2020. FINDINGS: Brain: Confirmed extra-axial (series 9, image 10) dural based and fairly homogeneously enhancing mass along the anterior right frontal convexity encompassing 21 by 40 by 66 mm (AP by transverse by CC). Associated mild mass effect on the anterior right frontal lobe and possibly mild leptomeningeal thickening (series 20, image 11) but no definite cerebral edema. Additionally, the mass broadly involves the anterior falx and appears to occlude the anterior most portion of the superior sagittal sinus (series 19, image 24 and series 17, image 23). Additional fairly smooth pachymeningeal thickening and enhancement in the right anterior cranial fossa along the right inferior frontal gyrus (series 18, image 90). No other abnormal enhancement or extra-axial mass identified. However, there is a trace left side subdural hematoma (series 11, image 28) up to 2 mm in thickness. And similar small right side subdural hematoma which is nearly isointense to CSF measuring 2-3 mm in thickness (series 11,  image 32 and series 17, image 13). No subsequent midline shift. Basilar cisterns remain normal. No ventriculomegaly. No intraventricular hemorrhage identified. Furthermore there is a small 5 mm focus of abnormal diffusion in the left frontal operculum with focal T2 and FLAIR hyperintensity which is more suggestive of a small  cerebral contusion than a small acute infarct. No other restricted diffusion. Underlying innumerable foci of chronic microhemorrhage throughout the bilateral brain (series 15 image 40) relatively sparing the deep gray nuclei and brainstem. Some deep cerebellar nuclei involvement. Additional Patchy and confluent nonspecific bilateral cerebral white matter T2 and FLAIR hyperintensity, with occasional small areas of cystic encephalomalacia most resembling chronic white matter lacunar infarcts (series 11, image 39). Deep gray matter nuclei, brainstem signal is within normal limits for age. Cervicomedullary junction and pituitary are within normal limits. Vascular: Major intracranial vascular flow voids are preserved. The anterior superior sagittal sinus appears occluded secondary to involvement by meningioma (series 17, image 23), but the mid and posterior aspects of that sinus, as well as the other major dural venous structures appear to remain patent. Skull and upper cervical spine: Negative for age visible cervical spine. Skull bone marrow signal is normal. Sinuses/Orbits: Negative. Other: Mastoids are clear. Visible internal auditory structures appear normal. Chronic appearing right posterior scalp soft tissue scarring. IMPRESSION: 1. Small or trace bilateral subdural hematomas (up to 3 mm on the right and 2 mm on the left) are likely posttraumatic along with evidence of a small left frontal operculum cerebral contusion. No midline shift. Basilar cisterns remain normal. 2. 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 3. 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 Superimposed chronic white matter signal changes, likely small vessel disease related. Electronically Signed: By: Genevie Ann M.D. On: 05/15/2020 22:57   DG Chest Port 1 View  Result Date:  05/14/2020 CLINICAL DATA:  Altered level of consciousness, sepsis, hyperglycemia EXAM: PORTABLE CHEST 1 VIEW COMPARISON:  08/25/2017 FINDINGS: Single frontal view of the chest demonstrates an unremarkable cardiac silhouette. No airspace disease, effusion, or pneumothorax. No acute bony abnormality. IMPRESSION: 1. Stable exam, no acute process. Electronically Signed   By: Randa Ngo M.D.   On: 05/14/2020 17:40   ECHOCARDIOGRAM COMPLETE  Result Date: 05/15/2020    ECHOCARDIOGRAM REPORT   Patient Name:   JARIAH JARMON Date of Exam: 05/15/2020 Medical Rec #:  [id number]        Height:       69.0 in Accession #:    [id number]       Weight:       180.0 lb Date of Birth:  1945-08-27        BSA:          1.976 m Patient Age:    69 years         BP:           163/93 mmHg Patient Gender: M                HR:           60 bpm. Exam Location:  [facility] Procedure: 2D Echo, Color Doppler, Cardiac Doppler and Strain Analysis Indications:     R55 Syncope  History:         Patient has no prior history of Echocardiogram examinations.  Risk Factors:Diabetes. Hepatitis C.  Sonographer:     Charmayne Sheer RDCS (AE) Referring Phys:  [id number] AMY N [facility] Diagnosing Phys: Bartholome Bill MD  Sonographer Comments: Suboptimal parasternal window. Image acquisition challenging due to uncooperative patient. Global longitudinal strain was attempted. IMPRESSIONS  1. Left ventricular ejection fraction, by estimation, is 60 to 65%. The left ventricle has normal function. The left ventricle has no regional wall motion abnormalities. There is mild left ventricular hypertrophy. Left ventricular diastolic parameters are consistent with Grade I diastolic dysfunction (impaired relaxation).  2. Right ventricular systolic function is normal. The right ventricular size is normal.  3. The mitral valve is grossly normal. Trivial mitral valve regurgitation.  4. The aortic valve is grossly normal. Aortic valve regurgitation is trivial. FINDINGS  Left  Ventricle: Left ventricular ejection fraction, by estimation, is 60 to 65%. The left ventricle has normal function. The left ventricle has no regional wall motion abnormalities. The left ventricular internal cavity size was normal in size. There is  mild left ventricular hypertrophy. Left ventricular diastolic parameters are consistent with Grade I diastolic dysfunction (impaired relaxation). Right Ventricle: The right ventricular size is normal. No increase in right ventricular wall thickness. Right ventricular systolic function is normal. Left Atrium: Left atrial size was normal in size. Right Atrium: Right atrial size was normal in size. Pericardium: There is no evidence of pericardial effusion. Mitral Valve: The mitral valve is grossly normal. Trivial mitral valve regurgitation. MV peak gradient, 3.4 mmHg. The mean mitral valve gradient is 1.0 mmHg. Tricuspid Valve: The tricuspid valve is not well visualized. Tricuspid valve regurgitation is trivial. Aortic Valve: The aortic valve is grossly normal. Aortic valve regurgitation is trivial. Aortic valve mean gradient measures 3.0 mmHg. Aortic valve peak gradient measures 5.5 mmHg. Aortic valve area, by VTI measures 2.55 cm. Pulmonic Valve: The pulmonic valve was not well visualized. Pulmonic valve regurgitation is trivial. Aorta: The aortic root is normal in size and structure. IAS/Shunts: The interatrial septum was not assessed.  LEFT VENTRICLE PLAX 2D LVIDd:         4.15 cm  Diastology LVIDs:         2.62 cm  LV e' medial:    4.24 cm/s LV PW:         1.15 cm  LV E/e' medial:  11.2 LV IVS:        0.82 cm  LV e' lateral:   7.62 cm/s LVOT diam:     2.00 cm  LV E/e' lateral: 6.2 LV SV:         60 LV SV Index:   30 LVOT Area:     3.14 cm  RIGHT VENTRICLE RV Basal diam:  3.56 cm LEFT ATRIUM             Index       RIGHT ATRIUM           Index LA diam:        2.90 cm 1.47 cm/m  RA Area:     17.30 cm LA Vol (A2C):   44.4 ml 22.48 ml/m RA Volume:   46.70 ml  23.64  ml/m LA Vol (A4C):   21.1 ml 10.68 ml/m LA Biplane Vol: 31.4 ml 15.89 ml/m  AORTIC VALVE                   PULMONIC VALVE AV Area (Vmax):    2.17 cm    PV Vmax:       0.61 m/s AV  Area (Vmean):   2.10 cm    PV Vmean:      47.400 cm/s AV Area (VTI):     2.55 cm    PV VTI:        0.143 m AV Vmax:           117.00 cm/s PV Peak grad:  1.5 mmHg AV Vmean:          78.100 cm/s PV Mean grad:  1.0 mmHg AV VTI:            0.235 m AV Peak Grad:      5.5 mmHg AV Mean Grad:      3.0 mmHg LVOT Vmax:         80.90 cm/s LVOT Vmean:        52.100 cm/s LVOT VTI:          0.191 m LVOT/AV VTI ratio: 0.81  AORTA Ao Root diam: 3.30 cm MITRAL VALVE MV Area (PHT): 2.42 cm    SHUNTS MV Peak grad:  3.4 mmHg    Systemic VTI:  0.19 m MV Mean grad:  1.0 mmHg    Systemic Diam: 2.00 cm MV Vmax:       0.92 m/s MV Vmean:      44.1 cm/s MV Decel Time: 313 msec MV E velocity: 47.60 cm/s MV A velocity: 64.70 cm/s MV E/A ratio:  0.74 Bartholome Bill MD Electronically signed by Bartholome Bill MD Signature Date/Time: 05/15/2020/12:07:42 PM    Final    Scheduled Meds: . buprenorphine-naloxone  1 tablet Sublingual Daily  . Chlorhexidine Gluconate Cloth  6 each Topical Daily  . citalopram  10 mg Oral Daily  . divalproex  500 mg Oral Q12H  . ferrous sulfate  325 mg Oral Q breakfast  . insulin aspart  0-20 Units Subcutaneous Q4H  . insulin detemir  10 Units Subcutaneous Q2200  . Ledipasvir-Sofosbuvir  1 tablet Oral Daily  . multivitamin with minerals  1 tablet Oral Daily  . OLANZapine  5 mg Oral QHS  . tamsulosin  0.8 mg Oral Daily  . [START ON 05/23/2020] thiamine injection  100 mg Intravenous Daily  . [START ON 05/17/2020] vitamin B-12  1,000 mcg Oral Daily   Continuous Infusions: . thiamine injection 500 mg (05/16/20 1335)   Followed by  . [START ON 05/17/2020] thiamine injection    . vancomycin Stopped (05/16/20 1315)    LOS: 1 day   Kerney Elbe, DO [facility] PAGER is on AMION  If 7PM-7AM, please contact  night-coverage [email or web address]

## 2020-05-16 NOTE — Progress Notes (Signed)
Nutrition Brief Note  Patient identified on the Malnutrition Screening Tool (MST) Report  Wt Readings from Last 15 Encounters:  05/14/20 81.6 kg  10/28/19 79.4 kg  03/02/19 80.4 kg  12/07/18 80.8 kg  07/01/18 78.9 kg  06/19/18 78.9 kg  03/11/18 76.7 kg  02/27/18 76.2 kg  02/19/18 88.9 kg  10/14/16 65.4 kg  10/06/16 75.3 kg  08/26/16 75.8 kg  03/16/16 79.8 kg  03/14/16 22.33 kg   74 year old male with PMHx of DM, hepatitis C, HIV, schizophrenia admitted with AMS, sepsis, syncope.  Met with patient at bedside. Safety sitter present. Patient reports he has good appetite now and at baseline. He had finished about 95% of his breakfast tray at time of RD assessment. He reports at home he eats 2-3 meals per day. He typically eats a meat with sides. He reports he receives Meals on Wheels. Patient reports he is weight-stable but is unsure of specific UBW. RD bed scale weight of 82.0 kg. Patient has been weight-stable for >1 year now. Completed Nutrition-Focused Physical exam and only found mild muscle depletion in posterior calf region. Patient does not meet criteria for malnutrition at this time.   Body mass index is 26.58 kg/m. Patient meets criteria for overweight based on current BMI.   Current diet order is heart healthy/carbohydrate modified, patient is consuming approximately 95% of meals at this time. Labs and medications reviewed.   No nutrition interventions warranted at this time. If nutrition issues arise, please consult RD.   Jacklynn Barnacle, MS, RD, LDN Pager number available on Amion

## 2020-05-16 NOTE — Progress Notes (Signed)
Pt refusing telemetry. Says he is going home. MD paged.

## 2020-05-16 NOTE — Progress Notes (Signed)
Patient refusing sat probe at this time, with try later.

## 2020-05-16 NOTE — Progress Notes (Signed)
NEUROLOGY CONSULTATION PROGRESS NOTE   Date of service: May 16, 2020 Patient Name: Lance [name] MRN:  [id number] DOB:  [birth year]  Brief HPI  Lance [name] is a 74 y.o. male with PMH significant for DM2, Hep C, Schizophrenia who is admitted with being found down by his neighbors. Workup with leukocytosis, positive UDS for cocaine. 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 Associated mild mass effect on the anterior right frontal lobe and possibly mild leptomeningeal thickening but no definite cerebral edema. 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   Interval Hx   He is very uncooperative with staff, has been refusing telemetry, rEEG. Vitals   Vitals:   05/16/20 1100 05/16/20 1200 05/16/20 1300 05/16/20 1333  BP:      Pulse:      Resp: 13 12 (!) 7   Temp:      TempSrc:      SpO2:      Weight:    82 kg  Height:         Body mass index is 26.7 kg/m.  Physical Exam   General: Laying comfortably in bed; in no acute distress. HENT: Normal oropharynx and mucosa. Normal external appearance of ears and nose. Neck: Supple, no pain or tenderness CV: No JVD. No peripheral edema. Pulmonary: Symmetric Chest rise. Normal respiratory effort. Abdomen: Soft to touch, non-tender. Ext: No cyanosis, edema, or deformity  Skin: No rash. Normal palpation of skin.  Musculoskeletal: Normal digits and nails by inspection. No clubbing.   Neurologic Examination  Mental status/Cognition: Alert, oriented to self, but not month and time or place. Appears to have  significant cognitive deficit. Speech/language: Fluent, comprehension intact, object naming intact, repetition intact to simple commands. Cranial nerves:   CN II Pupils equal and reactive to light, no VF deficits   CN III,IV,VI EOM intact, no gaze preference or deviation, no nystagmus   CN V normal sensation in V1, V2, and V3 segments bilaterally   CN VII no asymmetry, no nasolabial fold flattening   CN VIII normal hearing to speech   CN IX & X normal palatal elevation, no uvular deviation   CN XI    CN XII midline tongue protrusion   Motor:  Muscle bulk: poor, tone normal, pronator drift none tremor none.  Does not cooperate with full strength testing but moving all extremities spontaneously and antigravity.  Sensation:  Light touch Intact throughout   Pin prick    Temperature    Vibration   Proprioception     Labs   Basic Metabolic Panel:  Lab Results  Component Value Date   NA 139 05/16/2020   K 3.3 (L) 05/16/2020   CO2 25 05/16/2020   GLUCOSE 79 05/16/2020   BUN 14 05/16/2020   CREATININE 0.98 05/16/2020   CALCIUM 8.6 (L) 05/16/2020   GFRNONAA >60 05/16/2020   GFRAA >60 10/28/2019   HbA1c:  Lab Results  Component Value Date   HGBA1C 10.8 (H) 05/14/2020   LDL: No results found for: [facility] Urine Drug Screen:     Component Value Date/Time   LABOPIA NONE DETECTED 05/14/2020 [place]  POSITIVE (A) 05/14/2020 1720   LABBENZ NONE DETECTED 05/14/2020 1720   AMPHETMU NONE DETECTED 05/14/2020 1720   THCU NONE DETECTED 05/14/2020 1720   LABBARB NONE DETECTED 05/14/2020 1720    Alcohol Level     Component Value Date/Time   ETH <10 05/14/2020 1833   No results found for: PHENYTOIN, ZONISAMIDE, LAMOTRIGINE, LEVETIRACETA No results found for: PHENYTOIN, PHENOBARB, VALPROATE, CBMZ  Imaging and Diagnostic studies   MRI Brain with and without contrast:  1. Small or trace bilateral subdural hematomas (up to 3 mm on the right and 2 mm on the left) are  likely posttraumatic along with evidence of a small left frontal operculum cerebral contusion. No midline shift. Basilar cisterns remain normal.  2. 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  3. 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 Superimposed chronic white matter signal changes, likely small vessel disease related.  Impression   Lance [name] is a 74 y.o. male with PMH significant for DM2, Hep C, Schizophrenia who is admitted with being found down by his neighbors. His neurologic examination is notable for cognitive impairment but no focal deficit.  Unfortunately, it is difficult to work him up with him refusing all testing including rEEG. We will start him on depakote with hope that this will calm him down and help with any potential seizures.  Recommendations  - I discontinue Keppra - I ordered Depakote 500mg  BID - Will reattempt EEG when he is more calm - Vit B12 levels were low normal, will start PO Cyancobalamin 1000mg  daily - Continue Thiamine high dose replacement protocol - I ordered Multivitamin tablet PO ______________________________________________________________________   Thank you for the opportunity to take part in the care of this patient. If you have any further questions, please contact the neurology consultation attending.  Signed,  [place] Pager Number [phone]

## 2020-05-16 NOTE — Consult Note (Signed)
[facility] Face-to-Face Psychiatry Consult   Reason for Consult: Consult for this 74 year old man with a history of substance abuse and possible other mental health issues.  Currently delirious. Referring Physician:  Alfredia Ferguson Patient Identification: Lance [name] MRN:  [id number] Principal Diagnosis: Subacute delirium Diagnosis:  Principal Problem:   Subacute delirium Active Problems:   Sepsis ([place])   Syncope   Total Time spent with patient: 45 minutes  Subjective:   Lance [name] is a 74 y.o. male patient admitted with patient not verbal.  HPI: Patient seen chart reviewed.  Patient is in the intensive care unit.  Sitter at bedside.  Sitter tells me that for the last couple hours he had been asleep and that staff are hoping he will stay asleep as he had been very agitated earlier climbing out of bed urinating on the floor agitated.  I did not wake the patient up so did not get any direct history.  74 year old man with multiple severe medical problems as well as substance abuse who was brought in with altered mental status .  Evidently he was fairly intact mentally when first seen by the hospitalist 2 days ago but subsequently became confused and delirious.  Also a meningioma was discovered which may have some slight mass-effect.  Question raised with neurology about the possibility of seizures.  Since then it appears that he is continued to be agitated and confused.  Patient currently unable to offer any history.  It appears that he had been using cocaine and binge drinking prior to hospitalization.  It is very unclear to me what if any medication he had been taking recently.  Past Psychiatric History: A diagnosis of schizophrenia is on the chart.  None of the more recent notes that I could find actually described any symptoms in detail.  It appears that for quite some time he has seen a nurse practitioner for a diagnosis of bipolar disorder.  Exact medications unclear.  Could not find documentation  of inpatient psychiatric hospitalization.  Risk to Self:   Risk to Others:   Prior Inpatient Therapy:   Prior Outpatient Therapy:    Past Medical History:  Past Medical History:  Diagnosis Date  . Diabetes mellitus without complication ([place])   . Hepatitis C   . Schizophrenia [facility])     Past Surgical History:  Procedure Laterality Date  . ESOPHAGOGASTRODUODENOSCOPY (EGD) WITH PROPOFOL N/A 10/10/2016   Procedure: ESOPHAGOGASTRODUODENOSCOPY (EGD) WITH PROPOFOL;  Surgeon: Lucilla Lame, MD;  Location: [facility] ENDOSCOPY;  Service: Endoscopy;  Laterality: N/A;  . HERNIA REPAIR    . VISCERAL ARTERY INTERVENTION N/A 10/11/2016   Procedure: Visceral Artery Intervention;  Surgeon: Algernon Huxley, MD;  Location: [place] [facility];  Service: Cardiovascular;  Laterality: N/A;   Family History:  Family History  Problem Relation Age of Onset  . Diabetes Sister    Family Psychiatric  History: Unknown Social History:  Social History   Substance and Sexual Activity  Alcohol Use Not Currently  . Alcohol/week: 10.0 standard drinks  . Types: 10 Shots of liquor per week     Social History   Substance and Sexual Activity  Drug Use No    Social History   Socioeconomic History  . Marital status: Widowed    Spouse name: Not on file  . Number of children: Not on file  . Years of education: Not on file  . Highest education level: Not on file  Occupational History  . Not on file  Tobacco Use  .  Smoking status: Current Every Day Smoker    Packs/day: 0.25  . Smokeless tobacco: Never Used  Vaping Use  . Vaping Use: Never used  Substance and Sexual Activity  . Alcohol use: Not Currently    Alcohol/week: 10.0 standard drinks    Types: 10 Shots of liquor per week  . Drug use: No  . Sexual activity: Yes    Birth control/protection: None  Other Topics Concern  . Not on file  Social History Narrative  . Not on file   Social Determinants of Health   Financial Resource Strain:   . Difficulty  of Paying Living Expenses: Not on file  Food Insecurity:   . Worried About Charity fundraiser in the Last Year: Not on file  . Ran Out of Food in the Last Year: Not on file  Transportation Needs:   . Lack of Transportation (Medical): Not on file  . Lack of Transportation (Non-Medical): Not on file  Physical Activity:   . Days of Exercise per Week: Not on file  . Minutes of Exercise per Session: Not on file  Stress:   . Feeling of Stress : Not on file  Social Connections:   . Frequency of Communication with Friends and Family: Not on file  . Frequency of Social Gatherings with Friends and Family: Not on file  . Attends Religious Services: Not on file  . Active Member of Clubs or Organizations: Not on file  . Attends Archivist Meetings: Not on file  . Marital Status: Not on file   Additional Social History:    Allergies:  No Known Allergies  Labs:  Results for orders placed or performed during the hospital encounter of 05/14/20 (from the past 48 hour(s))  Urinalysis, Complete w Microscopic     Status: Abnormal   Collection Time: 05/14/20  5:20 PM  Result Value Ref Range   Color, Urine STRAW (A) YELLOW   APPearance HAZY (A) CLEAR   Specific Gravity, Urine 1.028 1.005 - 1.030   pH 5.0 5.0 - 8.0   Glucose, UA >=500 (A) NEGATIVE mg/dL   Hgb urine dipstick MODERATE (A) NEGATIVE   Bilirubin Urine NEGATIVE NEGATIVE   Ketones, ur NEGATIVE NEGATIVE mg/dL   Protein, ur 30 (A) NEGATIVE mg/dL   Nitrite NEGATIVE NEGATIVE   Leukocytes,Ua TRACE (A) NEGATIVE   RBC / HPF 0-5 0 - 5 RBC/hpf   WBC, UA 21-50 0 - 5 WBC/hpf   Bacteria, UA NONE SEEN NONE SEEN   Squamous Epithelial / LPF 0-5 0 - 5   Hyaline Casts, UA PRESENT     Comment: Performed at [facility], [address], [place], [place] [address]  Urine culture     Status: Abnormal (Preliminary result)   Collection Time: 05/14/20  5:20 PM   Specimen: Urine, Random  Result Value Ref Range   Specimen Description       URINE, RANDOM Performed at [facility], [address] [place], [place] [address]    Special Requests      NONE Performed at [facility], [address] [place], [place] [address]    Culture (A)     70,000 COLONIES/mL GRAM NEGATIVE RODS IDENTIFICATION AND SUSCEPTIBILITIES TO FOLLOW Performed at [facility], [address], [place], [place] [address]    Report Status PENDING   Urine Drug Screen, Qualitative ([place] only)     Status: Abnormal   Collection Time: 05/14/20  5:20 PM  Result Value Ref  Range   Tricyclic, Ur Screen NONE DETECTED NONE DETECTED   Amphetamines, Ur Screen NONE DETECTED NONE DETECTED   MDMA (Ecstasy)Ur Screen NONE DETECTED NONE DETECTED   Cocaine Metabolite,Ur [place] POSITIVE (A) NONE DETECTED   Opiate, Ur Screen NONE DETECTED NONE DETECTED   Phencyclidine (PCP) Ur S NONE DETECTED NONE DETECTED   Cannabinoid 50 Ng, Ur [place] NONE DETECTED NONE DETECTED   Barbiturates, Ur Screen NONE DETECTED NONE DETECTED   Benzodiazepine, Ur Scrn NONE DETECTED NONE DETECTED   Methadone Scn, Ur NONE DETECTED NONE DETECTED    Comment: (NOTE) Tricyclics + metabolites, urine    Cutoff 1000 ng/mL Amphetamines + metabolites, urine  Cutoff 1000 ng/mL MDMA (Ecstasy), urine              Cutoff 500 ng/mL Cocaine Metabolite, urine          Cutoff 300 ng/mL Opiate + metabolites, urine        Cutoff 300 ng/mL Phencyclidine (PCP), urine         Cutoff 25 ng/mL Cannabinoid, urine                 Cutoff 50 ng/mL Barbiturates + metabolites, urine  Cutoff 200 ng/mL Benzodiazepine, urine              Cutoff 200 ng/mL Methadone, urine                   Cutoff 300 ng/mL  The urine drug screen provides only a preliminary, unconfirmed analytical test result and should not be used for non-medical purposes. Clinical consideration and professional judgment should be applied to any positive drug screen result due to possible interfering substances. A more specific  alternate chemical method must be used in order to obtain a confirmed analytical result. Gas chromatography / mass spectrometry (GC/MS) is the preferred confirm atory method. Performed at [facility], [address] [place], [place] [address]   Blood gas, arterial     Status: Abnormal   Collection Time: 05/14/20  5:25 PM  Result Value Ref Range   FIO2 0.21    pH, Arterial 7.50 (H) 7.35 - 7.45   pCO2 arterial 30 (L) 32 - 48 mmHg   pO2, Arterial 151 (H) 83 - 108 mmHg   Bicarbonate 23.4 20.0 - 28.0 mmol/L   Acid-Base Excess 1.0 0.0 - 2.0 mmol/L   O2 Saturation 99.5 %   Patient temperature 37.0    Collection site RIGHT RADIAL    Sample type ARTERIAL DRAW    Allens test (pass/fail) PASS PASS    Comment: Performed at [facility], [address] [place], [place] [address]  CK     Status: Abnormal   Collection Time: 05/14/20  6:21 PM  Result Value Ref Range   Total CK 548 (H) 49.0 - 397.0 U/L    Comment: Performed at [facility], [address] [place], [place] [address]  Blood culture (routine x 2)     Status: None (Preliminary result)   Collection Time: 05/14/20  6:22 PM   Specimen: BLOOD  Result Value Ref Range   Specimen Description BLOOD LEFT HAND    Special Requests      BOTTLES DRAWN AEROBIC AND ANAEROBIC Blood Culture adequate volume   Culture      NO GROWTH 2 DAYS Performed at [facility], [address], [place], [place] [address]    Report Status PENDING   Blood culture (routine x 2)  Status: None (Preliminary result)   Collection Time: 05/14/20  6:23 PM   Specimen: BLOOD  Result Value Ref Range   Specimen Description BLOOD RIGHT FA    Special Requests      BOTTLES DRAWN AEROBIC AND ANAEROBIC Blood Culture adequate volume   Culture      NO GROWTH 2 DAYS Performed at [facility], [address], [place], [place] [address]    Report Status PENDING   Lactic acid, plasma     Status: Abnormal   Collection  Time: 05/14/20  6:33 PM  Result Value Ref Range   Lactic Acid, Venous 3.1 (HH) 0.5 - 1.9 mmol/L    Comment: CRITICAL RESULT CALLED TO, READ BACK BY AND VERIFIED WITH TAMMY JUDE 05/14/20 AT 1858 BY ACR Performed at [facility], [address] [place], [place] [address]   Protime-INR     Status: None   Collection Time: 05/14/20  6:33 PM  Result Value Ref Range   Prothrombin Time 13.1 11.4 - 15.2 seconds   INR 1.0 0.8 - 1.2    Comment: (NOTE) INR goal varies based on device and disease states. Performed at [facility], [address] [place], [place] [address]   APTT     Status: None   Collection Time: 05/14/20  6:33 PM  Result Value Ref Range   aPTT 27 24 - 36 seconds    Comment: Performed at [facility], [address] [place], [place] [address]  CBC with Differential/Platelet     Status: Abnormal   Collection Time: 05/14/20  6:33 PM  Result Value Ref Range   WBC 15.2 (H) 4.0 - 10.5 K/uL   RBC 4.54 4.22 - 5.81 MIL/uL   Hemoglobin 13.8 13.0 - 17.0 g/dL   HCT 40.3 39 - 52 %   MCV 88.8 80.0 - 100.0 fL   MCH 30.4 26.0 - 34.0 pg   MCHC 34.2 30.0 - 36.0 g/dL   RDW 12.9 11.5 - 15.5 %   Platelets 215 150 - 400 K/uL   nRBC 0.0 0.0 - 0.2 %   Neutrophils Relative % 88 %   Neutro Abs 13.2 (H) 1.7 - 7.7 K/uL   Lymphocytes Relative 7 %   Lymphs Abs 1.1 0.7 - 4.0 K/uL   Monocytes Relative 4 %   Monocytes Absolute 0.7 0.1 - 1.0 K/uL   Eosinophils Relative 0 %   Eosinophils Absolute 0.0 0.0 - 0.5 K/uL   Basophils Relative 0 %   Basophils Absolute 0.0 0.0 - 0.1 K/uL   Immature Granulocytes 1 %   Abs Immature Granulocytes 0.08 (H) 0.00 - 0.07 K/uL    Comment: Performed at [facility], [address] [place], [place] [address]  Comprehensive metabolic panel     Status: Abnormal   Collection Time: 05/14/20  6:33 PM  Result Value Ref Range   Sodium 138 135 - 145 mmol/L   Potassium 4.1 3.5 - 5.1 mmol/L   Chloride 103 98 - 111 mmol/L   CO2 26  22 - 32 mmol/L   Glucose, Bld 381 (H) 70 - 99 mg/dL    Comment: Glucose reference range applies only to samples taken after fasting for at least 8 hours.   BUN 12 8 - 23 mg/dL   Creatinine, Ser 1.16 0.61 - 1.24 mg/dL   Calcium 9.3 8.9 - 10.3 mg/dL   Total Protein 7.9 6.5 - 8.1 g/dL   Albumin 3.6 3.5 - 5.0 g/dL   AST  21 15 - 41 U/L   ALT 15 0 - 44 U/L   Alkaline Phosphatase 120 38 - 126 U/L   Total Bilirubin 0.7 0.3 - 1.2 mg/dL   GFR, Estimated >60 >60 mL/min    Comment: (NOTE) Calculated using the CKD-EPI Creatinine Equation (2021)    Anion gap 9 5 - 15    Comment: Performed at [facility], [address] [place], [place] [address]  Ethanol     Status: None   Collection Time: 05/14/20  6:33 PM  Result Value Ref Range   Alcohol, Ethyl (B) <10 <10 mg/dL    Comment: (NOTE) Lowest detectable limit for serum alcohol is 10 mg/dL.  For medical purposes only. Performed at [facility], [address] [place], [place] [address]   TSH     Status: None   Collection Time: 05/14/20  6:33 PM  Result Value Ref Range   TSH 0.957 0.350 - 4.500 uIU/mL    Comment: Performed by a 3rd Generation assay with a functional sensitivity of <=0.01 uIU/mL. Performed at [facility], [address] [place], [place] [address]   Hemoglobin A1c     Status: Abnormal   Collection Time: 05/14/20  6:33 PM  Result Value Ref Range   Hgb A1c MFr Bld 10.8 (H) 4.8 - 5.6 %    Comment: (NOTE) Pre diabetes:          5.7%-6.4%  Diabetes:              >6.4%  Glycemic control for   <7.0% adults with diabetes    Mean Plasma Glucose 263.26 mg/dL    Comment: Performed at [facility] [address], [place], [place] [address]  Troponin I (High Sensitivity)     Status: None   Collection Time: 05/14/20  7:49 PM  Result Value Ref Range   Troponin I (High Sensitivity) 11 <18 ng/L    Comment: (NOTE) Elevated high sensitivity troponin I (hsTnI) values and significant  changes  across serial measurements may suggest ACS but many other  chronic and acute conditions are known to elevate hsTnI results.  Refer to the "Links" section for chest pain algorithms and additional  guidance. Performed at [facility], [address] [place], [place] [address]   CBG monitoring, ED     Status: Abnormal   Collection Time: 05/14/20  7:57 PM  Result Value Ref Range   Glucose-Capillary 329 (H) 70 - 99 mg/dL    Comment: Glucose reference range applies only to samples taken after fasting for at least 8 hours.  Lactic acid, plasma     Status: None   Collection Time: 05/14/20  8:03 PM  Result Value Ref Range   Lactic Acid, Venous 1.9 0.5 - 1.9 mmol/L    Comment: Performed at [facility], [address] [place], [place] [address]  Resp Panel by RT-PCR (Flu A&B, Covid) Nasopharyngeal Swab     Status: None   Collection Time: 05/14/20  8:03 PM   Specimen: Nasopharyngeal Swab; Nasopharyngeal(NP) swabs in vial transport medium  Result Value Ref Range   SARS Coronavirus 2 by RT PCR NEGATIVE NEGATIVE    Comment: (NOTE) SARS-CoV-2 target nucleic acids are NOT DETECTED.  The SARS-CoV-2 RNA is generally detectable in upper respiratory specimens during the acute phase of infection. The lowest concentration of SARS-CoV-2 viral copies this assay can detect is 138 copies/mL. A negative result does not preclude SARS-Cov-2 infection and should not be used  as the sole basis for treatment or other patient management decisions. A negative result may occur with  improper specimen collection/handling, submission of specimen other than nasopharyngeal swab, presence of viral mutation(s) within the areas targeted by this assay, and inadequate number of viral copies(<138 copies/mL). A negative result must be combined with clinical observations, patient history, and epidemiological information. The expected result is Negative.  Fact Sheet for Patients:   EntrepreneurPulse.com.au  Fact Sheet for Healthcare Providers:  IncredibleEmployment.be  This test is no t yet approved or cleared by the [place] FDA and  has been authorized for detection and/or diagnosis of SARS-CoV-2 by FDA under an Emergency Use Authorization (EUA). This EUA will remain  in effect (meaning this test can be used) for the duration of the COVID-19 declaration under Section 564(b)(1) of the Act, 21 U.S.C.section 360bbb-3(b)(1), unless the authorization is terminated  or revoked sooner.       Influenza A by PCR NEGATIVE NEGATIVE   Influenza B by PCR NEGATIVE NEGATIVE    Comment: (NOTE) The Xpert Xpress SARS-CoV-2/FLU/RSV plus assay is intended as an aid in the diagnosis of influenza from Nasopharyngeal swab specimens and should not be used as a sole basis for treatment. Nasal washings and aspirates are unacceptable for Xpert Xpress SARS-CoV-2/FLU/RSV testing.  Fact Sheet for Patients: EntrepreneurPulse.com.au  Fact Sheet for Healthcare Providers: IncredibleEmployment.be  This test is not yet approved or cleared by the [place] FDA and has been authorized for detection and/or diagnosis of SARS-CoV-2 by FDA under an Emergency Use Authorization (EUA). This EUA will remain in effect (meaning this test can be used) for the duration of the COVID-19 declaration under Section 564(b)(1) of the Act, 21 U.S.C. section 360bbb-3(b)(1), unless the authorization is terminated or revoked.  Performed at [facility], [address] [place], [place] [address]   Ammonia     Status: Abnormal   Collection Time: 05/14/20  8:03 PM  Result Value Ref Range   Ammonia 40 (H) 9 - 35 umol/L    Comment: Performed at [facility], [address] [place], [place] [address]  Fibrin derivatives D-Dimer [facility] only)     Status: Abnormal   Collection Time: 05/14/20 10:58 PM  Result  Value Ref Range   Fibrin derivatives D-dimer ([facility]) 1,417.44 (H) 0.00 - 499.00 ng/mL (FEU)    Comment: (NOTE) <> Exclusion of Venous Thromboembolism (VTE) - OUTPATIENT ONLY   (Emergency Department or [place])    0-499 ng/ml (FEU): With a low to intermediate pretest probability                      for VTE this test result excludes the diagnosis                      of VTE.   >499 ng/ml (FEU) : VTE not excluded; additional work up for VTE is                      required.  <> Testing on Inpatients and Evaluation of Disseminated Intravascular   Coagulation (DIC) Reference Range:   0-499 ng/ml (FEU) Performed at [facility], [address] [place], [place] [address]   CBG monitoring, ED     Status: Abnormal   Collection Time: 05/15/20 12:33 AM  Result Value Ref Range   Glucose-Capillary 213 (H) 70 - 99 mg/dL    Comment: Glucose reference range applies only to samples taken after fasting  for at least 8 hours.  Protime-INR     Status: None   Collection Time: 05/15/20  4:41 AM  Result Value Ref Range   Prothrombin Time 13.5 11.4 - 15.2 seconds   INR 1.1 0.8 - 1.2    Comment: (NOTE) INR goal varies based on device and disease states. Performed at [facility], [address] [place], [place] [address]   Cortisol-am, blood     Status: None   Collection Time: 05/15/20  4:41 AM  Result Value Ref Range   Cortisol - AM 11.5 6.7 - 22.6 ug/dL    Comment: Performed at [facility] [address], [place], [place] [address]  Procalcitonin     Status: None   Collection Time: 05/15/20  4:41 AM  Result Value Ref Range   Procalcitonin <0.10 ng/mL    Comment:        Interpretation: PCT (Procalcitonin) <= 0.5 ng/mL: Systemic infection (sepsis) is not likely. Local bacterial infection is possible. (NOTE)       Sepsis PCT Algorithm           Lower Respiratory Tract                                      Infection PCT Algorithm    ----------------------------      ----------------------------         PCT < 0.25 ng/mL                PCT < 0.10 ng/mL          Strongly encourage             Strongly discourage   discontinuation of antibiotics    initiation of antibiotics    ----------------------------     -----------------------------       PCT 0.25 - 0.50 ng/mL            PCT 0.10 - 0.25 ng/mL               OR       >80% decrease in PCT            Discourage initiation of                                            antibiotics      Encourage discontinuation           of antibiotics    ----------------------------     -----------------------------         PCT >= 0.50 ng/mL              PCT 0.26 - 0.50 ng/mL               AND        <80% decrease in PCT             Encourage initiation of                                             antibiotics       Encourage continuation           of antibiotics    ----------------------------     -----------------------------  PCT >= 0.50 ng/mL                  PCT > 0.50 ng/mL               AND         increase in PCT                  Strongly encourage                                      initiation of antibiotics    Strongly encourage escalation           of antibiotics                                     -----------------------------                                           PCT <= 0.25 ng/mL                                                 OR                                        > 80% decrease in PCT                                      Discontinue / Do not initiate                                             antibiotics  Performed at [facility], [address] [place], [place] [address]   CBG monitoring, ED     Status: Abnormal   Collection Time: 05/15/20  4:04 PM  Result Value Ref Range   Glucose-Capillary 309 (H) 70 - 99 mg/dL    Comment: Glucose reference range applies only to samples taken after fasting for at least 8 hours.  Folate     Status: None   Collection Time:  05/15/20  5:49 PM  Result Value Ref Range   Folate 13.1 >5.9 ng/mL    Comment: Performed at [facility], [address] [place], [place] [address]  TSH     Status: None   Collection Time: 05/15/20  5:49 PM  Result Value Ref Range   TSH 1.225 0.350 - 4.500 uIU/mL    Comment: Performed by a 3rd Generation assay with a functional sensitivity of <=0.01 uIU/mL. Performed at [facility], [address] [place], [place] [address]   Vitamin B12     Status: None   Collection Time: 05/15/20  5:49 PM  Result Value Ref Range   Vitamin B-12 294 180 - 914 pg/mL    Comment: (NOTE) This assay is not validated  for testing neonatal or myeloproliferative syndrome specimens for Vitamin B12 levels. Performed at [facility], [place] [address], [place], [place] [address]   RPR     Status: None   Collection Time: 05/15/20  5:49 PM  Result Value Ref Range   RPR Ser Ql NON REACTIVE NON REACTIVE    Comment: Performed at [facility], [place] [address] [place], [place] [address]  HIV Antibody (routine testing w rflx)     Status: None   Collection Time: 05/15/20  5:49 PM  Result Value Ref Range   HIV Screen 4th Generation wRfx Non Reactive Non Reactive    Comment: Performed at [facility], [place] [address] [place], [place] [address]  Glucose, capillary     Status: Abnormal   Collection Time: 05/15/20  8:10 PM  Result Value Ref Range   Glucose-Capillary 197 (H) 70 - 99 mg/dL    Comment: Glucose reference range applies only to samples taken after fasting for at least 8 hours.  Glucose, capillary     Status: Abnormal   Collection Time: 05/15/20 11:58 PM  Result Value Ref Range   Glucose-Capillary 183 (H) 70 - 99 mg/dL    Comment: Glucose reference range applies only to samples taken after fasting for at least 8 hours.  Glucose, capillary     Status: Abnormal   Collection Time: 05/16/20 12:53 AM  Result Value Ref Range   Glucose-Capillary 251 (H) 70 - 99 mg/dL     Comment: Glucose reference range applies only to samples taken after fasting for at least 8 hours.   Comment 1 Document in Chart   CBC with Differential/Platelet     Status: Abnormal   Collection Time: 05/16/20  4:58 AM  Result Value Ref Range   WBC 10.1 4.0 - 10.5 K/uL   RBC 3.92 (L) 4.22 - 5.81 MIL/uL   Hemoglobin 11.9 (L) 13.0 - 17.0 g/dL   HCT 35.5 (L) 39 - 52 %   MCV 90.6 80.0 - 100.0 fL   MCH 30.4 26.0 - 34.0 pg   MCHC 33.5 30.0 - 36.0 g/dL   RDW 13.1 11.5 - 15.5 %   Platelets 200 150 - 400 K/uL   nRBC 0.0 0.0 - 0.2 %   Neutrophils Relative % 69 %   Neutro Abs 7.1 1.7 - 7.7 K/uL   Lymphocytes Relative 21 %   Lymphs Abs 2.1 0.7 - 4.0 K/uL   Monocytes Relative 8 %   Monocytes Absolute 0.8 0.1 - 1.0 K/uL   Eosinophils Relative 1 %   Eosinophils Absolute 0.1 0.0 - 0.5 K/uL   Basophils Relative 0 %   Basophils Absolute 0.0 0.0 - 0.1 K/uL   Immature Granulocytes 1 %   Abs Immature Granulocytes 0.08 (H) 0.00 - 0.07 K/uL    Comment: Performed at [facility], [address] [place], [place] [address]  Basic metabolic panel     Status: Abnormal   Collection Time: 05/16/20  4:58 AM  Result Value Ref Range   Sodium 139 135 - 145 mmol/L   Potassium 3.3 (L) 3.5 - 5.1 mmol/L   Chloride 106 98 - 111 mmol/L   CO2 25 22 - 32 mmol/L   Glucose, Bld 79 70 - 99 mg/dL    Comment: Glucose reference range applies only to samples taken after fasting for at least 8 hours.   BUN 14 8 - 23 mg/dL   Creatinine, Ser 0.98 0.61 - 1.24 mg/dL  Calcium 8.6 (L) 8.9 - 10.3 mg/dL   GFR, Estimated >60 >60 mL/min    Comment: (NOTE) Calculated using the CKD-EPI Creatinine Equation (2021)    Anion gap 8 5 - 15    Comment: Performed at [facility], [address] [place],  [address]  Glucose, capillary     Status: Abnormal   Collection Time: 05/16/20  5:09 AM  Result Value Ref Range   Glucose-Capillary 61 (L) 70 - 99 mg/dL    Comment: Glucose reference range applies only to  samples taken after fasting for at least 8 hours.   Comment 1 Document in Chart   Glucose, capillary     Status: Abnormal   Collection Time: 05/16/20  5:59 AM  Result Value Ref Range   Glucose-Capillary 102 (H) 70 - 99 mg/dL    Comment: Glucose reference range applies only to samples taken after fasting for at least 8 hours.   Comment 1 Document in Chart   Glucose, capillary     Status: Abnormal   Collection Time: 05/16/20  7:17 AM  Result Value Ref Range   Glucose-Capillary 118 (H) 70 - 99 mg/dL    Comment: Glucose reference range applies only to samples taken after fasting for at least 8 hours.    Current Facility-Administered Medications  Medication Dose Route Frequency Provider Last Rate Last Admin  . acetaminophen (TYLENOL) tablet 325 mg  325 mg Oral Q6H PRN Cox, Amy N, DO       Or  . acetaminophen (TYLENOL) suppository 325 mg  325 mg Rectal Q6H PRN Cox, Amy N, DO      . albuterol (PROVENTIL) (2.5 MG/3ML) 0.083% nebulizer solution 2.5 mg  2.5 mg Inhalation Q6H PRN Cox, Amy N, DO      . buprenorphine-naloxone (SUBOXONE) 8-2 mg per SL tablet 1 tablet  1 tablet Sublingual Daily Swayze, Ava, DO   1 tablet at 05/16/20 1033  . Chlorhexidine Gluconate Cloth 2 % PADS 6 each  6 each Topical Daily Sharion Settler, NP      . citalopram (CELEXA) tablet 10 mg  10 mg Oral Daily Cox, Amy N, DO   10 mg at 05/16/20 1033  . divalproex (DEPAKOTE) DR tablet 500 mg  500 mg Oral Q12H Donnetta Simpers, MD      . ferrous sulfate tablet 325 mg  325 mg Oral Q breakfast Cox, Amy N, DO   325 mg at 05/16/20 1033  . haloperidol lactate (HALDOL) injection 1 mg  1 mg Intravenous Q4H PRN Eevie Lapp T, MD      . hydrALAZINE (APRESOLINE) tablet 25 mg  25 mg Oral Q8H PRN Cox, Amy N, DO      . insulin aspart (novoLOG) injection 0-20 Units  0-20 Units Subcutaneous Q4H Sharion Settler, NP   11 Units at 05/16/20 0106  . insulin detemir (LEVEMIR) injection 10 Units  10 Units Subcutaneous Q2200 Cox, Amy N, DO   10  Units at 05/15/20 0055  . Ledipasvir-Sofosbuvir 90-400 MG TABS 1 tablet  1 tablet Oral Daily Swayze, Ava, DO      . multivitamin with minerals tablet 1 tablet  1 tablet Oral Daily Cox, Amy N, DO   1 tablet at 05/16/20 1033  . OLANZapine (ZYPREXA) tablet 5 mg  5 mg Oral QHS Cox, Amy N, DO   5 mg at 05/15/20 0031  . ondansetron (ZOFRAN) tablet 4 mg  4 mg Oral Q6H PRN Cox, Amy N, DO  Or  . ondansetron (ZOFRAN) injection 4 mg  4 mg Intravenous Q6H PRN Cox, Amy N, DO      . polyethylene glycol (MIRALAX / GLYCOLAX) packet 17 g  17 g Oral Daily PRN Cox, Amy N, DO      . polyvinyl alcohol (LIQUIFILM TEARS) 1.4 % ophthalmic solution 1 drop  1 drop Both Eyes PRN Swayze, Ava, DO      . tamsulosin (FLOMAX) capsule 0.8 mg  0.8 mg Oral Daily Cox, Amy N, DO   0.8 mg at 05/16/20 1032  . thiamine 500mg  in normal saline (67ml) IVPB  500 mg Intravenous Q8H Donnetta Simpers, MD 100 mL/hr at 05/16/20 1335 500 mg at 05/16/20 1335   Followed by  . [START ON 05/17/2020] thiamine (B-1) 250 mg in sodium chloride 0.9 % 50 mL IVPB  250 mg Intravenous Daily Donnetta Simpers, MD       Followed by  . [START ON 05/23/2020] thiamine (B-1) injection 100 mg  100 mg Intravenous Daily Donnetta Simpers, MD      . vancomycin (VANCOREADY) IVPB 750 mg/150 mL  750 mg Intravenous Q12H Cox, Amy N, DO   Stopped at 05/16/20 1315  . [START ON 05/17/2020] vitamin B-12 (CYANOCOBALAMIN) tablet 1,000 mcg  1,000 mcg Oral Daily Donnetta Simpers, MD        Musculoskeletal: Strength & Muscle Tone: decreased Gait & Station: unsteady Patient leans: N/A  Psychiatric Specialty Exam: Physical Exam Vitals and nursing note reviewed.  Constitutional:      Appearance: He is well-developed.  HENT:     Head: Normocephalic and atraumatic.  Eyes:     Conjunctiva/sclera: Conjunctivae normal.     Pupils: Pupils are equal, round, and reactive to light.  Cardiovascular:     Heart sounds: Normal heart sounds.  Pulmonary:     Effort:  Pulmonary effort is normal.  Abdominal:     Palpations: Abdomen is soft.  Musculoskeletal:        General: Normal range of motion.     Cervical back: Normal range of motion.  Skin:    General: Skin is warm and dry.  Neurological:     Mental Status: He is alert.  Psychiatric:        Attention and Perception: He is inattentive.        Mood and Affect: Affect is flat.        Speech: He is noncommunicative.     Review of Systems  Unable to perform ROS: Patient nonverbal    Blood pressure 124/65, pulse 82, temperature (!) 97.5 F (36.4 C), temperature source Axillary, resp. rate 11, height 5\' 9"  (1.753 m), weight 82 kg, SpO2 100 %.Body mass index is 26.7 kg/m.  General Appearance: Disheveled  Eye Contact:  None  Speech:  Negative  Volume:  Decreased  Mood:  Negative  Affect:  Negative  Thought Process:  NA  Orientation:  Negative  Thought Content:  Negative  Suicidal Thoughts:  No  Homicidal Thoughts:  No  Memory:  Negative  Judgement:  Negative  Insight:  Negative  Psychomotor Activity:  Negative  Concentration:  Concentration: Negative  Recall:  Negative  Fund of Knowledge:  Negative  Language:  Negative  Akathisia:  Negative  Handed:  Right  AIMS (if indicated):     Assets:  Resilience  ADL's:  Impaired  Cognition:  Impaired,  Moderate  Sleep:        Treatment Plan Summary: Plan Patient presented to the hospital with some altered mental  status but was able to give history and appeared to have been at least transiently lucid.  Subsequently became agitated.  Wide differential diagnosis.  Possibly related to the meningioma.  Possibly seizures.  Possibly sepsis.  Possibly delirium tremens.  Currently patient was knocked out when I went to see him probably from having been given 2 mg of haloperidol IV.  I am going to make a slight change to his orders to make the Haldol 1 mg but every 4 hours as needed and would encourage use of that as needed for any agitation.  If we  knew for certain it was DTs we could switch to Precedex more easily or benzodiazepines.  For now just subjectively treat the agitation.  I will continue to follow-up.  Disposition: Recommend psychiatric Inpatient admission when medically cleared.  Alethia Berthold, MD 05/16/2020 5:04 PM

## 2020-05-16 NOTE — Progress Notes (Signed)
Pt refused CBG at this time °

## 2020-05-16 NOTE — Progress Notes (Signed)
Pt refusing BP cycles at this time.

## 2020-05-16 NOTE — Progress Notes (Signed)
Pt refused cbg check at this time.

## 2020-05-17 ENCOUNTER — Inpatient Hospital Stay: Payer: Medicare HMO

## 2020-05-17 ENCOUNTER — Encounter: Payer: Self-pay | Admitting: Internal Medicine

## 2020-05-17 DIAGNOSIS — R4182 Altered mental status, unspecified: Secondary | ICD-10-CM

## 2020-05-17 DIAGNOSIS — R410 Disorientation, unspecified: Secondary | ICD-10-CM

## 2020-05-17 LAB — URINE CULTURE: Culture: 70000 — AB

## 2020-05-17 LAB — CBC WITH DIFFERENTIAL/PLATELET
Abs Immature Granulocytes: 0.06 10*3/uL (ref 0.00–0.07)
Basophils Absolute: 0 10*3/uL (ref 0.0–0.1)
Basophils Relative: 0 %
Eosinophils Absolute: 0.1 10*3/uL (ref 0.0–0.5)
Eosinophils Relative: 1 %
HCT: 35.4 % — ABNORMAL LOW (ref 39.0–52.0)
Hemoglobin: 11.8 g/dL — ABNORMAL LOW (ref 13.0–17.0)
Immature Granulocytes: 1 %
Lymphocytes Relative: 26 %
Lymphs Abs: 2.7 10*3/uL (ref 0.7–4.0)
MCH: 30.3 pg (ref 26.0–34.0)
MCHC: 33.3 g/dL (ref 30.0–36.0)
MCV: 91 fL (ref 80.0–100.0)
Monocytes Absolute: 1 10*3/uL (ref 0.1–1.0)
Monocytes Relative: 10 %
Neutro Abs: 6.3 10*3/uL (ref 1.7–7.7)
Neutrophils Relative %: 62 %
Platelets: 215 10*3/uL (ref 150–400)
RBC: 3.89 MIL/uL — ABNORMAL LOW (ref 4.22–5.81)
RDW: 13.2 % (ref 11.5–15.5)
WBC: 10.3 10*3/uL (ref 4.0–10.5)
nRBC: 0 % (ref 0.0–0.2)

## 2020-05-17 LAB — GLUCOSE, CAPILLARY
Glucose-Capillary: 51 mg/dL — ABNORMAL LOW (ref 70–99)
Glucose-Capillary: 119 mg/dL — ABNORMAL HIGH (ref 70–99)
Glucose-Capillary: 95 mg/dL (ref 70–99)
Glucose-Capillary: 185 mg/dL — ABNORMAL HIGH (ref 70–99)
Glucose-Capillary: 168 mg/dL — ABNORMAL HIGH (ref 70–99)
Glucose-Capillary: 210 mg/dL — ABNORMAL HIGH (ref 70–99)

## 2020-05-17 LAB — PHOSPHORUS: Phosphorus: 3.6 mg/dL (ref 2.5–4.6)

## 2020-05-17 LAB — COMPREHENSIVE METABOLIC PANEL
ALT: 12 U/L (ref 0–44)
AST: 13 U/L — ABNORMAL LOW (ref 15–41)
Albumin: 2.9 g/dL — ABNORMAL LOW (ref 3.5–5.0)
Alkaline Phosphatase: 80 U/L (ref 38–126)
Anion gap: 8 (ref 5–15)
BUN: 16 mg/dL (ref 8–23)
CO2: 24 mmol/L (ref 22–32)
Calcium: 8.7 mg/dL — ABNORMAL LOW (ref 8.9–10.3)
Chloride: 106 mmol/L (ref 98–111)
Creatinine, Ser: 1.04 mg/dL (ref 0.61–1.24)
GFR, Estimated: >60 mL/min (ref >60)
Glucose, Bld: 176 mg/dL — ABNORMAL HIGH (ref 70–99)
Potassium: 3.5 mmol/L (ref 3.5–5.1)
Sodium: 138 mmol/L (ref 135–145)
Total Bilirubin: 0.5 mg/dL (ref 0.3–1.2)
Total Protein: 6.5 g/dL (ref 6.5–8.1)

## 2020-05-17 LAB — MAGNESIUM: Magnesium: 2 mg/dL (ref 1.7–2.4)

## 2020-05-17 MED ORDER — DEXTROSE 50 % IV SOLN
INTRAVENOUS | Status: AC
  Administered 2020-05-17: 25 mL via INTRAVENOUS
  Filled 2020-05-17: qty 50

## 2020-05-17 MED ORDER — INSULIN ASPART 100 UNIT/ML ~~LOC~~ SOLN
0.0000 [IU] | Freq: Three times a day (TID) | SUBCUTANEOUS | Status: DC
  Administered 2020-05-17 – 2020-05-18 (×3): 4 [IU] via SUBCUTANEOUS
  Filled 2020-05-17 (×3): qty 1

## 2020-05-17 MED ORDER — IOHEXOL 350 MG/ML SOLN
75.0000 mL | Freq: Once | INTRAVENOUS | Status: AC | PRN
  Administered 2020-05-17: 75 mL via INTRAVENOUS

## 2020-05-17 MED ORDER — DEXTROSE 50 % IV SOLN: 25.0000 mL | Freq: Once | INTRAVENOUS | Status: AC

## 2020-05-17 NOTE — Progress Notes (Signed)
Mobility Specialist - Progress Note   05/17/20 1300  Mobility  Range of Motion/Exercises Right leg;Left leg (ankle pumps, straight leg raises, hip abd/add)  Level of Assistance Standby assist, set-up cues, supervision of patient - no hands on  Assistive Device None  Distance Ambulated (ft) 0 ft  Mobility Response Tolerated well  Mobility performed by Mobility specialist  $Mobility charge 1 Mobility    Pre-mobility: 89 HR, 98% SpO2 Post-mobility: 89 HR, 98% SpO2   Pt was sleeping in bed upon arrival with sitter present in room. Pt was awakened by voice and agreed to session. Pt denied any pain, nausea, or fatigue. Pt performed supine exercises: ankle pumps, straight leg raises, and hip abd/add. Pt appears lethargic, falling asleep 3 times during activity, further mobility deferred. Overall, pt tolerated well. Pt was left in bed with all needs in reach. Sitter still present after exit.    Kathee Delton Mobility Specialist 05/17/20, 2:03 PM

## 2020-05-17 NOTE — Progress Notes (Signed)
PROGRESS NOTE  Lance [name] KWI:[id number] DOB: [birth year] DOA: 05/14/2020 PCP: Theotis Burrow, MD  Brief History   Lance [name] is a 74 y.o. male with medical history significant for hepatitis C was treated with Harvoni, insulin-dependent diabetes mellitus presented to the emergency department for chief concerns of being found unresponsive/altered mentation by his neighbors or friends.  No one is at bedside on my evaluation.  Patient does not want anyone to be called.  He does not know what happened.  He reports that he ate dinner today.  He denies anything out of the ordinary.  He denies drug use on day of admission.  He endorses compliance with medications.  ROS was negative for headache, vision changes, chest pain, shortness of breath, nausea, vomiting, dysphagia, fever, chills, dysuria, hematuria, diarrhea, weakness in legs, appetite changes, unintentional weight loss.   Social history: lives by himself. Endorses cocaine use and that the last time he used cocaine was about 1 week ago. He lasted drink etoh 1 week ago.   [facility] were consulted to admit the patient for further evaluation and treatment.   The patient tells me that he only remembers waking up on the ground. He states that he had no warning that he was going to pass out. No chest pain or shortness of breath prior to event. Feels fine now.  MRI brain demonstrates: Small or trace bilateral subdural hematomas, likely post traumatic,6 cm long axis right anterior frontal convexity Meningioma with extension to the midline, invasion and occlusion of the anterior-most superior sagittal sinusbutthe mid and posterior aspects of that sinus, as well as the other major dural venous structures appear to remain patent.. 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  Psychiatry has evaluated the patient and has found him to have subacute delirium, but not to require inpatient psychiatric placement at this time. No mental health treatment is required in their opinion other than substance abuse referral.  Consultants  . Neurology . Psychiatry  Procedures  . EEG  Antibiotics   Anti-infectives (From admission, onward)   Start     Dose/Rate Route Frequency Ordered Stop   05/15/20 1230  Ledipasvir-Sofosbuvir 90-400 MG TABS 1 tablet        1 tablet Oral Daily 05/15/20 1223     05/15/20 0900  vancomycin (VANCOREADY) IVPB 750 mg/150 mL  Status:  Discontinued        750 mg 150 mL/hr over 60 Minutes Intravenous Every 12 hours 05/14/20 2251 05/16/20 1809   05/15/20 0800  ceFEPIme (MAXIPIME) 2 g in sodium chloride 0.9 % 100 mL IVPB  Status:  Discontinued        2 g 200 mL/hr over 30 Minutes Intravenous Every 12 hours 05/14/20 2250 05/15/20 1321   05/14/20 2300  vancomycin (VANCOCIN) IVPB 1000 mg/200 mL premix        1,000 mg 200 mL/hr over 60 Minutes Intravenous  Once 05/14/20 2250 05/15/20 0156   05/14/20 2245  ceFEPIme (MAXIPIME) 2 g in sodium chloride 0.9 % 100 mL IVPB  Status:  Discontinued        2 g 200 mL/hr over 30 Minutes Intravenous  Once 05/14/20 2244 05/14/20 2248   05/14/20 2245  vancomycin (VANCOCIN) IVPB 1000 mg/200 mL premix  Status:  Discontinued        1,000 mg 200 mL/hr over 60 Minutes  Intravenous  Once 05/14/20 2244 05/14/20 2248   05/14/20 2245  metroNIDAZOLE (FLAGYL) tablet 500 mg  Status:  Discontinued        500 mg Oral Every 8 hours 05/14/20 2244 05/14/20 2248   05/14/20 1930  ceFEPIme (MAXIPIME) 2 g in sodium chloride 0.9 % 100 mL IVPB        2 g 200 mL/hr over 30 Minutes Intravenous  Once 05/14/20 1923 05/14/20 2106   05/14/20 1930  metroNIDAZOLE (FLAGYL) IVPB 500 mg        500 mg 100 mL/hr over 60 Minutes Intravenous  Once 05/14/20 1923 05/14/20 2106   05/14/20 1930  vancomycin  (VANCOCIN) IVPB 1000 mg/200 mL premix        1,000 mg 200 mL/hr over 60 Minutes Intravenous  Once 05/14/20 1923 05/14/20 2219      Subjective  During my visit the patient is lying patiently in his bed. He is cooperated, oriented and pleasant. No new complaints.  Objective   Vitals:  Vitals:   05/17/20 1122 05/17/20 1500  BP: (!) 162/77 135/87  Pulse: 86 87  Resp: 17 18  Temp: 97.9 F (36.6 C) 97.8 F (36.6 C)  SpO2: 100% 100%   Exam:  Constitutional:  . The patient is awake, alert, and oriented x 3. No acute distress. Respiratory:  . No increased work of breathing. . No wheezes, rales, or rhonchi . No tactile fremitus Cardiovascular:  . Regular rate and rhythm . No murmurs, ectopy, or gallups. . No lateral PMI. No thrills. Abdomen:  . Abdomen is soft, non-tender, non-distended . No hernias, masses, or organomegaly . Normoactive bowel sounds.  Musculoskeletal:  . No cyanosis, clubbing, or edema Skin:  . No rashes, lesions, ulcers . palpation of skin: no induration or nodules Neurologic:  . CN 2-12 intact . Sensation all 4 extremities intact Psychiatric:  . Mental status o Mood, affect appropriate o Orientation to person, place, time  . judgment and insight appear intact  I have personally reviewed the following:   Today's Data  . Vitals, CMP, CBC  Micro Data  . Blood cultures x 2: No growth  Imaging  . CT head . MRI Brain  Cardiology Data  . EKG . Echocardiogram pending  Other Data  . EEG  Scheduled Meds: . buprenorphine-naloxone  1 tablet Sublingual Daily  . Chlorhexidine Gluconate Cloth  6 each Topical Daily  . citalopram  10 mg Oral Daily  . divalproex  500 mg Oral Q12H  . ferrous sulfate  325 mg Oral Q breakfast  . insulin aspart  0-20 Units Subcutaneous TID WC  . insulin detemir  10 Units Subcutaneous Q2200  . Ledipasvir-Sofosbuvir  1 tablet Oral Daily  . multivitamin with minerals  1 tablet Oral Daily  . OLANZapine  5 mg Oral QHS   . tamsulosin  0.8 mg Oral Daily  . [START ON 05/23/2020] thiamine injection  100 mg Intravenous Daily  . vitamin B-12  1,000 mcg Oral Daily   Continuous Infusions: . thiamine injection 250 mg (05/17/20 1526)    Principal Problem:   Subacute delirium Active Problems:   Sepsis ([place])   Syncope   LOS: 2 days   A & P  Altered level of consciousness: The patient has had swings of behavior between being cooperative and lucid to being confused, agitated aond combative. Concern for seizure activity. I have discussed the patient with Neurology. Seizure activity vs encephalopathy/delirium secondary to alcohol and cocaine abuse.   Sepsis: Doubt. Leukocytosis -  due to demargination/reactive. Lactic acid - due to syncope/seizure/cocaine abuse. Resolved. No fever, hypotension, or likely source of infection. Procalcitonin negative. Antibiotics stopped.   Syncope/Seizure/AlteredMentalStatus/Post ictal period: Seizure would explain lactic acidosis, decreased level of consciousness, and leukocytosis. Possibly due to cocaine abuse and/or meningioma in the f. Echocardiogram ordered to evaluate for syncope. The patient is being monitored on telemetry. I have discussed the patient with Dr. Lorrin Goodell. He will consult and he has ordered an EEG. He recommends that Keppra be initiated if the patient has further episodes. Keppra has been changed to depakote.  Insulin-dependent diabetes mellitus-resumed home insulin -Insulin SSI -Checking A1c  HIV: Continue antiretroviral regimen.  I have seen and examined the patient myself. I have spent 36 minutes in his evaluation and care.  DVT prophylaxis: enoxaparin  Code Status: full code Diet: Carb modified/heart healthy Family Communication: he does not want anyone updated Disposition Plan: Pending clinical course/Pt/OT eval.   Jailee Jaquez, DO [facility] Direct contact: see [email or web address]  7PM-7AM contact night coverage as above 05/17/2020, 6:16 PM   LOS: 0 days

## 2020-05-17 NOTE — Progress Notes (Signed)
eeg done °

## 2020-05-17 NOTE — Progress Notes (Signed)
NEUROLOGY CONSULTATION PROGRESS NOTE   Date of service: May 17, 2020 Patient Name: Lance [name] MRN:  [id number] DOB:  01-Jul-1945  Brief HPI  Lance [name] is a 74 y.o. male with PMH significant for DM2, Hep C, Schizophrenia who is admitted with being found down by his neighbors. Found to have R frontal meningioma with mild R frontal mass effect and trace convexity SDH which seems to be likely traumatic. Also has findings concerning for amyloid angiopathy. UDS positive for cocaine.    Interval Hx   Daughter is at bedside and was able to help [place] get him to agree to cooperate with an EEG.  Vitals   Vitals:   05/17/20 0116 05/17/20 0151 05/17/20 0725 05/17/20 1122  BP: 130/82 (!) 138/97 (!) 166/94 (!) 162/77  Pulse: (!) 121 81 69 86  Resp:  15 17 17   Temp:  98.1 F (36.7 C) 97.8 F (36.6 C) 97.9 F (36.6 C)  TempSrc:  Oral    SpO2: 100% 99% 100% 100%  Weight:  80.4 kg    Height:  5\' 9"  (1.753 m)       Body mass index is 26.18 kg/m.  Physical Exam   General: Laying comfortably in bed; in no acute distress.  HENT: Normal oropharynx and mucosa. Normal external appearance of ears and nose.  Neck: Supple, no pain or tenderness  CV: No JVD. No peripheral edema.  Pulmonary: Symmetric Chest rise. Normal respiratory effort.  Abdomen: Soft to touch, non-tender.  Ext: No cyanosis, edema, or deformity  Skin: No rash. Normal palpation of skin.   Musculoskeletal: Normal digits and nails by inspection. No clubbing.   Neurologic Examination   Limited exam completed today: Mental status/Cognition: Alert, oriented to self, place, recognizes family members, poor attention. Speech/language: Fluent, comprehension intact  Cranial nerves:   CN II Pupils equal and reactive to light.   CN III,IV,VI Looks left and right.   CN V    CN VII no asymmetry, no nasolabial fold flattening   CN VIII normal hearing to speech   CN IX & X    CN XI    CN XII    Motor:  Moves all  extremities spontaneously and antigravity atleast.    Labs   Basic Metabolic Panel:  Lab Results  Component Value Date   NA 138 05/17/2020   K 3.5 05/17/2020   CO2 24 05/17/2020   GLUCOSE 176 (H) 05/17/2020   BUN 16 05/17/2020   CREATININE 1.04 05/17/2020   CALCIUM 8.7 (L) 05/17/2020   GFRNONAA >60 05/17/2020   GFRAA >60 10/28/2019   HbA1c:  Lab Results  Component Value Date   HGBA1C 10.8 (H) 05/14/2020   LDL: No results found for: [facility] Urine Drug Screen:     Component Value Date/Time   LABOPIA NONE DETECTED 05/14/2020 1720   COCAINSCRNUR POSITIVE (A) 05/14/2020 1720   LABBENZ NONE DETECTED 05/14/2020 1720   AMPHETMU NONE DETECTED 05/14/2020 1720   THCU NONE DETECTED 05/14/2020 1720   LABBARB NONE DETECTED 05/14/2020 1720    Alcohol Level     Component Value Date/Time   ETH <10 05/14/2020 1833   No results found for: PHENYTOIN, ZONISAMIDE, LAMOTRIGINE, LEVETIRACETA No results found for: PHENYTOIN, PHENOBARB, VALPROATE, CBMZ  Imaging and Diagnostic studies   MRI Brain without contrast: Small or trace bilateral subdural hematomas, likely post traumatic, 6 cm long axis right anterior frontal convexity Meningioma with extension to the midline, invasion and occlusion of the anterior-most superior sagittal sinus  but the mid and posterior aspects of that sinus, as well as the other major dural venous structures appear to remain patent.. Associated mild mass effect on the anterior right frontal lobe and possibly mild leptomeningeal thickening but no definite cerebral edema. Innumerable chronic microhemorrhages in the brain suggesting Amyloid Angiopathy   Impression   Lance [name] is a 74 y.o. male with PMH significant for DM2, Hep C, Schizophrenia who is admitted with being found down by his neighbors. His neurologic examination is notable for cognitive impairment but no focal deficit.  MRI Brain with R frontal meningioma with mild mass effect on the R frontal  lobe, trace SDH probably due to trauma. Chronic lobar microhemorrhages concerning for amyloid angiopathy.  He is calm and cooperative today.  Recommendations  - Continue Depakote 500mg  BID - rEEG re-ordered. - Continue B12 and Thiamine replacement. I switched thiamine replacement to PO today. - Continue Multivitamin PO. ______________________________________________________________________   Thank you for the opportunity to take part in the care of this patient. If you have any further questions, please contact the neurology consultation attending.  Signed,  [place] Pager Number [phone]

## 2020-05-17 NOTE — Progress Notes (Signed)
Inpatient Diabetes Program Recommendations  AACE/ADA: New Consensus Statement on Inpatient Glycemic Control (2015)  Target Ranges:  Prepandial:   less than 140 mg/dL      Peak postprandial:   less than 180 mg/dL (1-2 hours)      Critically ill patients:  140 - 180 mg/dL   Lab Results  Component Value Date   GLUCAP 95 05/17/2020   HGBA1C 10.8 (H) 05/14/2020    Review of Glycemic Control Results for YAMIN, SWINGLER (MRN [id number]) as of 05/17/2020 10:10  Ref. Range 05/16/2020 07:17 05/16/2020 20:47 05/16/2020 23:39 05/17/2020 04:13 05/17/2020 04:48 05/17/2020 07:28  Glucose-Capillary Latest Ref Range: 70 - 99 mg/dL 118 (H) 252 (H) 276 (H) 51 (L) 119 (H) 95   Diabetes history: DM 2 Outpatient Diabetes medications: Levemir 10 units daily (pt. Not taking), Metformin 1000 mg bid (not taking), Januvia 100 mg daily (not taking) Current orders for Inpatient glycemic control:  Novolog resistant q 4 hours Levemir 10 units q HS  Inpatient Diabetes Program Recommendations:    Note low blood sugar this AM.   Please reduce Novolog correction to moderate tid with meals (instead of q 4 hours).   Thanks  Adah Perl, RN, BC-ADM Inpatient Diabetes Coordinator Pager [phone] (8a-5p)

## 2020-05-17 NOTE — Consult Note (Signed)
[facility] Consult   Reason for Consult: Follow-up for this 74 year old man with agitation and confusion Referring Physician: Swayze Patient Identification: LAJUAN KOVALESKI MRN:  [id number] Principal Diagnosis: Subacute delirium Diagnosis:  Principal Problem:   Subacute delirium Active Problems:   Sepsis ([place])   Syncope   Total Time spent with patient: 30 minutes  Subjective:   DREVION OFFORD is a 74 y.o. male patient admitted with "I was hanging out with some guys".  HPI: Follow-up for this patient who was in the ICU yesterday with agitation.  He is now in a regular medical bed.  Much different than yesterday.  Awake and alert and interactive although still a little confused.  Could not answer a question about where he was although he was able to tell me that yes he had been using cocaine and alcohol before admission.  Tells me he lives by himself does not normally take any outpatient psychiatric medicine.  Reports that his mood today is okay.  Denies hallucinations.  Past Psychiatric History: Past history of schizophrenia although the patient says that was years ago when he had not recently been getting any treatment for it.  More recently substance abuse.  Risk to Self:   Risk to Others:   Prior Inpatient Therapy:   Prior Outpatient Therapy:    Past Medical History:  Past Medical History:  Diagnosis Date  . Diabetes mellitus without complication ([place])   . Hepatitis C   . Schizophrenia [facility])     Past Surgical History:  Procedure Laterality Date  . ESOPHAGOGASTRODUODENOSCOPY (EGD) WITH PROPOFOL N/A 10/10/2016   Procedure: ESOPHAGOGASTRODUODENOSCOPY (EGD) WITH PROPOFOL;  Surgeon: Lucilla Lame, MD;  Location: [facility] ENDOSCOPY;  Service: Endoscopy;  Laterality: N/A;  . HERNIA REPAIR    . VISCERAL ARTERY INTERVENTION N/A 10/11/2016   Procedure: Visceral Artery Intervention;  Surgeon: Algernon Huxley, MD;  Location: [facility];  Service: Cardiovascular;   Laterality: N/A;   Family History:  Family History  Problem Relation Age of Onset  . Diabetes Sister    Family Psychiatric  History: See previous Social History:  Social History   Substance and Sexual Activity  Alcohol Use Not Currently  . Alcohol/week: 10.0 standard drinks  . Types: 10 Shots of liquor per week     Social History   Substance and Sexual Activity  Drug Use No    Social History   Socioeconomic History  . Marital status: Widowed    Spouse name: Not on file  . Number of children: Not on file  . Years of education: Not on file  . Highest education level: Not on file  Occupational History  . Not on file  Tobacco Use  . Smoking status: Current Every Day Smoker    Packs/day: 0.25  . Smokeless tobacco: Never Used  Vaping Use  . Vaping Use: Never used  Substance and Sexual Activity  . Alcohol use: Not Currently    Alcohol/week: 10.0 standard drinks    Types: 10 Shots of liquor per week  . Drug use: No  . Sexual activity: Yes    Birth control/protection: None  Other Topics Concern  . Not on file  Social History Narrative  . Not on file   Social Determinants of Health   Financial Resource Strain:   . Difficulty of Paying Living Expenses: Not on file  Food Insecurity:   . Worried About Charity fundraiser in the Last Year: Not on file  . Ran Out of  Food in the Last Year: Not on file  Transportation Needs:   . Lack of Transportation (Medical): Not on file  . Lack of Transportation (Non-Medical): Not on file  Physical Activity:   . Days of Exercise per Week: Not on file  . Minutes of Exercise per Session: Not on file  Stress:   . Feeling of Stress : Not on file  Social Connections:   . Frequency of Communication with Friends and Family: Not on file  . Frequency of Social Gatherings with Friends and Family: Not on file  . Attends Religious Services: Not on file  . Active Member of Clubs or Organizations: Not on file  . Attends Archivist  Meetings: Not on file  . Marital Status: Not on file   Additional Social History:    Allergies:  No Known Allergies  Labs:  Results for orders placed or performed during the hospital encounter of 05/14/20 (from the past 48 hour(s))  Folate     Status: None   Collection Time: 05/15/20  5:49 PM  Result Value Ref Range   Folate 13.1 >5.9 ng/mL    Comment: Performed at [facility], [address] [place], [place] [address]  TSH     Status: None   Collection Time: 05/15/20  5:49 PM  Result Value Ref Range   TSH 1.225 0.350 - 4.500 uIU/mL    Comment: Performed by a 3rd Generation assay with a functional sensitivity of <=0.01 uIU/mL. Performed at [facility] [address] [place], [place] [address]   Vitamin B12     Status: None   Collection Time: 05/15/20  5:49 PM  Result Value Ref Range   Vitamin B-12 294 180 - 914 pg/mL    Comment: (NOTE) This assay is not validated for testing neonatal or myeloproliferative syndrome specimens for Vitamin B12 levels. Performed at [facility], [place] [address] [place], [place] [address]   RPR     Status: None   Collection Time: 05/15/20  5:49 PM  Result Value Ref Range   RPR Ser Ql NON REACTIVE NON REACTIVE    Comment: Performed at [facility], [place] [address] [place], [place] [address]  HIV Antibody (routine testing w rflx)     Status: None   Collection Time: 05/15/20  5:49 PM  Result Value Ref Range   HIV Screen 4th Generation wRfx Non Reactive Non Reactive    Comment: Performed at [facility], [place] [address], [place], [place] [address]  Glucose, capillary     Status: Abnormal   Collection Time: 05/15/20  8:10 PM  Result Value Ref Range   Glucose-Capillary 197 (H) 70 - 99 mg/dL    Comment: Glucose reference range applies only to samples taken after fasting for at least 8 hours.  Glucose, capillary     Status: Abnormal   Collection Time: 05/15/20 11:58 PM  Result Value Ref Range    Glucose-Capillary 183 (H) 70 - 99 mg/dL    Comment: Glucose reference range applies only to samples taken after fasting for at least 8 hours.  Glucose, capillary     Status: Abnormal   Collection Time: 05/16/20 12:53 AM  Result Value Ref Range   Glucose-Capillary 251 (H) 70 - 99 mg/dL    Comment: Glucose reference range applies only to samples taken after fasting for at least 8 hours.   Comment 1 Document in Chart   CBC with Differential/Platelet     Status:  Abnormal   Collection Time: 05/16/20  4:58 AM  Result Value Ref Range   WBC 10.1 4.0 - 10.5 K/uL   RBC 3.92 (L) 4.22 - 5.81 MIL/uL   Hemoglobin 11.9 (L) 13.0 - 17.0 g/dL   HCT 35.5 (L) 39 - 52 %   MCV 90.6 80.0 - 100.0 fL   MCH 30.4 26.0 - 34.0 pg   MCHC 33.5 30.0 - 36.0 g/dL   RDW 13.1 11.5 - 15.5 %   Platelets 200 150 - 400 K/uL   nRBC 0.0 0.0 - 0.2 %   Neutrophils Relative % 69 %   Neutro Abs 7.1 1.7 - 7.7 K/uL   Lymphocytes Relative 21 %   Lymphs Abs 2.1 0.7 - 4.0 K/uL   Monocytes Relative 8 %   Monocytes Absolute 0.8 0.1 - 1.0 K/uL   Eosinophils Relative 1 %   Eosinophils Absolute 0.1 0.0 - 0.5 K/uL   Basophils Relative 0 %   Basophils Absolute 0.0 0.0 - 0.1 K/uL   Immature Granulocytes 1 %   Abs Immature Granulocytes 0.08 (H) 0.00 - 0.07 K/uL    Comment: Performed at [facility], [address] [place], [place] [address]  Basic metabolic panel     Status: Abnormal   Collection Time: 05/16/20  4:58 AM  Result Value Ref Range   Sodium 139 135 - 145 mmol/L   Potassium 3.3 (L) 3.5 - 5.1 mmol/L   Chloride 106 98 - 111 mmol/L   CO2 25 22 - 32 mmol/L   Glucose, Bld 79 70 - 99 mg/dL    Comment: Glucose reference range applies only to samples taken after fasting for at least 8 hours.   BUN 14 8 - 23 mg/dL   Creatinine, Ser 0.98 0.61 - 1.24 mg/dL   Calcium 8.6 (L) 8.9 - 10.3 mg/dL   GFR, Estimated >60 >60 mL/min    Comment: (NOTE) Calculated using the CKD-EPI Creatinine Equation (2021)    Anion gap 8 5 -  15    Comment: Performed at [facility], [address] [place], [place] [address]  Glucose, capillary     Status: Abnormal   Collection Time: 05/16/20  5:09 AM  Result Value Ref Range   Glucose-Capillary 61 (L) 70 - 99 mg/dL    Comment: Glucose reference range applies only to samples taken after fasting for at least 8 hours.   Comment 1 Document in Chart   Glucose, capillary     Status: Abnormal   Collection Time: 05/16/20  5:59 AM  Result Value Ref Range   Glucose-Capillary 102 (H) 70 - 99 mg/dL    Comment: Glucose reference range applies only to samples taken after fasting for at least 8 hours.   Comment 1 Document in Chart   Glucose, capillary     Status: Abnormal   Collection Time: 05/16/20  7:17 AM  Result Value Ref Range   Glucose-Capillary 118 (H) 70 - 99 mg/dL    Comment: Glucose reference range applies only to samples taken after fasting for at least 8 hours.  Glucose, capillary     Status: Abnormal   Collection Time: 05/16/20  8:47 PM  Result Value Ref Range   Glucose-Capillary 252 (H) 70 - 99 mg/dL    Comment: Glucose reference range applies only to samples taken after fasting for at least 8 hours.  Glucose, capillary     Status: Abnormal   Collection Time: 05/16/20 11:39 PM  Result Value Ref Range  Glucose-Capillary 276 (H) 70 - 99 mg/dL    Comment: Glucose reference range applies only to samples taken after fasting for at least 8 hours.  Glucose, capillary     Status: Abnormal   Collection Time: 05/17/20  4:13 AM  Result Value Ref Range   Glucose-Capillary 51 (L) 70 - 99 mg/dL    Comment: Glucose reference range applies only to samples taken after fasting for at least 8 hours.  CBC with Differential/Platelet     Status: Abnormal   Collection Time: 05/17/20  4:30 AM  Result Value Ref Range   WBC 10.3 4.0 - 10.5 K/uL   RBC 3.89 (L) 4.22 - 5.81 MIL/uL   Hemoglobin 11.8 (L) 13.0 - 17.0 g/dL   HCT 35.4 (L) 39 - 52 %   MCV 91.0 80.0 - 100.0 fL   MCH 30.3  26.0 - 34.0 pg   MCHC 33.3 30.0 - 36.0 g/dL   RDW 13.2 11.5 - 15.5 %   Platelets 215 150 - 400 K/uL   nRBC 0.0 0.0 - 0.2 %   Neutrophils Relative % 62 %   Neutro Abs 6.3 1.7 - 7.7 K/uL   Lymphocytes Relative 26 %   Lymphs Abs 2.7 0.7 - 4.0 K/uL   Monocytes Relative 10 %   Monocytes Absolute 1.0 0.1 - 1.0 K/uL   Eosinophils Relative 1 %   Eosinophils Absolute 0.1 0.0 - 0.5 K/uL   Basophils Relative 0 %   Basophils Absolute 0.0 0.0 - 0.1 K/uL   Immature Granulocytes 1 %   Abs Immature Granulocytes 0.06 0.00 - 0.07 K/uL    Comment: Performed at [facility], [address] [place], [place] [address]  Comprehensive metabolic panel     Status: Abnormal   Collection Time: 05/17/20  4:30 AM  Result Value Ref Range   Sodium 138 135 - 145 mmol/L   Potassium 3.5 3.5 - 5.1 mmol/L   Chloride 106 98 - 111 mmol/L   CO2 24 22 - 32 mmol/L   Glucose, Bld 176 (H) 70 - 99 mg/dL    Comment: Glucose reference range applies only to samples taken after fasting for at least 8 hours.   BUN 16 8 - 23 mg/dL   Creatinine, Ser 1.04 0.61 - 1.24 mg/dL   Calcium 8.7 (L) 8.9 - 10.3 mg/dL   Total Protein 6.5 6.5 - 8.1 g/dL   Albumin 2.9 (L) 3.5 - 5.0 g/dL   AST 13 (L) 15 - 41 U/L   ALT 12 0 - 44 U/L   Alkaline Phosphatase 80 38 - 126 U/L   Total Bilirubin 0.5 0.3 - 1.2 mg/dL   GFR, Estimated >60 >60 mL/min    Comment: (NOTE) Calculated using the CKD-EPI Creatinine Equation (2021)    Anion gap 8 5 - 15    Comment: Performed at [facility], [address] [place], [place] [address]  Magnesium     Status: None   Collection Time: 05/17/20  4:30 AM  Result Value Ref Range   Magnesium 2.0 1.7 - 2.4 mg/dL    Comment: Performed at [facility], [address] [place], [place] [address]  Phosphorus     Status: None   Collection Time: 05/17/20  4:30 AM  Result Value Ref Range   Phosphorus 3.6 2.5 - 4.6 mg/dL    Comment: Performed at [facility], [address] [place], [place] [address]  Glucose, capillary     Status: Abnormal  Collection Time: 05/17/20  4:48 AM  Result Value Ref Range   Glucose-Capillary 119 (H) 70 - 99 mg/dL    Comment: Glucose reference range applies only to samples taken after fasting for at least 8 hours.  Glucose, capillary     Status: None   Collection Time: 05/17/20  7:28 AM  Result Value Ref Range   Glucose-Capillary 95 70 - 99 mg/dL    Comment: Glucose reference range applies only to samples taken after fasting for at least 8 hours.  Glucose, capillary     Status: Abnormal   Collection Time: 05/17/20 11:53 AM  Result Value Ref Range   Glucose-Capillary 185 (H) 70 - 99 mg/dL    Comment: Glucose reference range applies only to samples taken after fasting for at least 8 hours.  Glucose, capillary     Status: Abnormal   Collection Time: 05/17/20  3:48 PM  Result Value Ref Range   Glucose-Capillary 168 (H) 70 - 99 mg/dL    Comment: Glucose reference range applies only to samples taken after fasting for at least 8 hours.    Current Facility-Administered Medications  Medication Dose Route Frequency Provider Last Rate Last Admin  . acetaminophen (TYLENOL) tablet 325 mg  325 mg Oral Q6H PRN Cox, Amy N, DO       Or  . acetaminophen (TYLENOL) suppository 325 mg  325 mg Rectal Q6H PRN Cox, Amy N, DO      . albuterol (PROVENTIL) (2.5 MG/3ML) 0.083% nebulizer solution 2.5 mg  2.5 mg Inhalation Q6H PRN Cox, Amy N, DO      . buprenorphine-naloxone (SUBOXONE) 8-2 mg per SL tablet 1 tablet  1 tablet Sublingual Daily Swayze, Ava, DO   1 tablet at 05/17/20 1003  . Chlorhexidine Gluconate Cloth 2 % PADS 6 each  6 each Topical Daily Sharion Settler, NP   6 each at 05/17/20 1000  . citalopram (CELEXA) tablet 10 mg  10 mg Oral Daily Cox, Amy N, DO   10 mg at 05/17/20 1004  . divalproex (DEPAKOTE) DR tablet 500 mg  500 mg Oral Q12H Donnetta Simpers, MD   500 mg at 05/17/20 1152  . ferrous sulfate tablet 325 mg  325 mg Oral Q breakfast  Cox, Amy N, DO   325 mg at 05/17/20 1003  . haloperidol lactate (HALDOL) injection 1 mg  1 mg Intravenous Q4H PRN Elisabetta Mishra T, MD      . hydrALAZINE (APRESOLINE) tablet 25 mg  25 mg Oral Q8H PRN Cox, Amy N, DO      . insulin aspart (novoLOG) injection 0-20 Units  0-20 Units Subcutaneous TID WC Swayze, Ava, DO   4 Units at 05/17/20 1158  . insulin detemir (LEVEMIR) injection 10 Units  10 Units Subcutaneous Q2200 Cox, Amy N, DO   10 Units at 05/16/20 2205  . Ledipasvir-Sofosbuvir 90-400 MG TABS 1 tablet  1 tablet Oral Daily Swayze, Ava, DO      . multivitamin with minerals tablet 1 tablet  1 tablet Oral Daily Cox, Amy N, DO   1 tablet at 05/17/20 1003  . OLANZapine (ZYPREXA) tablet 5 mg  5 mg Oral QHS Cox, Amy N, DO   5 mg at 05/16/20 2204  . ondansetron (ZOFRAN) tablet 4 mg  4 mg Oral Q6H PRN Cox, Amy N, DO       Or  . ondansetron (ZOFRAN) injection 4 mg  4 mg Intravenous Q6H PRN Cox, Amy N, DO      .  polyethylene glycol (MIRALAX / GLYCOLAX) packet 17 g  17 g Oral Daily PRN Cox, Amy N, DO      . polyvinyl alcohol (LIQUIFILM TEARS) 1.4 % ophthalmic solution 1 drop  1 drop Both Eyes PRN Swayze, Ava, DO      . tamsulosin (FLOMAX) capsule 0.8 mg  0.8 mg Oral Daily Cox, Amy N, DO   0.8 mg at 05/17/20 1003  . thiamine (B-1) 250 mg in sodium chloride 0.9 % 50 mL IVPB  250 mg Intravenous Daily Donnetta Simpers, MD 100 mL/hr at 05/17/20 1526 250 mg at 05/17/20 1526   Followed by  . [START ON 05/23/2020] thiamine (B-1) injection 100 mg  100 mg Intravenous Daily Donnetta Simpers, MD      . vitamin B-12 (CYANOCOBALAMIN) tablet 1,000 mcg  1,000 mcg Oral Daily Donnetta Simpers, MD   1,000 mcg at 05/17/20 1004    Musculoskeletal: Strength & Muscle Tone: decreased Gait & Station: broad based Patient leans: Right  Psychiatric Specialty Exam: Physical Exam Vitals and nursing note reviewed.  Constitutional:      Appearance: He is well-developed.  HENT:     Head: Normocephalic and atraumatic.   Eyes:     Conjunctiva/sclera: Conjunctivae normal.     Pupils: Pupils are equal, round, and reactive to light.  Cardiovascular:     Heart sounds: Normal heart sounds.  Pulmonary:     Effort: Pulmonary effort is normal.  Abdominal:     Palpations: Abdomen is soft.  Musculoskeletal:        General: Normal range of motion.     Cervical back: Normal range of motion.  Skin:    General: Skin is warm and dry.  Neurological:     Mental Status: He is alert.  Psychiatric:        Attention and Perception: He is inattentive.        Mood and Affect: Mood normal.        Speech: Speech is delayed.        Behavior: Behavior is cooperative.        Thought Content: Thought content normal.        Cognition and Memory: Memory is impaired.     Review of Systems  Constitutional: Negative.   HENT: Negative.   Eyes: Negative.   Respiratory: Negative.   Cardiovascular: Negative.   Gastrointestinal: Negative.   Musculoskeletal: Negative.   Skin: Negative.   Neurological: Negative.   Psychiatric/Behavioral: Negative.     Blood pressure 135/87, pulse 87, temperature 97.8 F (36.6 C), resp. rate 18, height 5\' 9"  (1.753 m), weight 80.4 kg, SpO2 100 %.Body mass index is 26.18 kg/m.  General Appearance: Casual  Eye Contact:  Minimal  Speech:  Slow  Volume:  Decreased  Mood:  Euthymic  Affect:  Constricted  Thought Process:  Disorganized  Orientation:  Negative  Thought Content:  Illogical  Suicidal Thoughts:  No  Homicidal Thoughts:  No  Memory:  Immediate;   Fair Recent;   Fair Remote;   Poor  Judgement:  Impaired  Insight:  Shallow  Psychomotor Activity:  Decreased  Concentration:  Concentration: Fair  Recall:  [employer] of Knowledge:  Fair  Language:  Fair  Akathisia:  No  Handed:  Right  AIMS (if indicated):     Assets:  Housing  ADL's:  Impaired  Cognition:  Impaired,  Mild  Sleep:        Treatment Plan Summary: Plan Patient is greatly improved.  Still a little  bit  confused but not as grossly delirious since yesterday.  No longer behaving in dangerous ways.  Able to eat a little better.  No change to as needed orders but no need to add any extra psychiatric medicine at this point.  We will follow-up tomorrow.  Patient may not need any mental health treatment except for substance abuse referral at discharge.  Disposition: Patient does not meet criteria for psychiatric inpatient admission.  Alethia Berthold, MD 05/17/2020 5:34 PM

## 2020-05-17 NOTE — Progress Notes (Signed)
Report given to 2A RN, transported patient to [facility] with 1-1 sitter. Bag of patient belongings with patient.

## 2020-05-17 NOTE — Procedures (Signed)
Patient Name: Lance [name]  MRN: [id number]  Epilepsy Attending: Lora Havens  Referring Physician/Provider: Dr Donnetta Simpers Date: 05/17/2020 Duration: 21.50 mins  Patient history:  74 y.o. male with PMH significant for DM2, Hep C, Schizophrenia who is admitted with being found down by his neighbors.EEG to evaluate for seizure.  Level of alertness: Awake  AEDs during EEG study: VPA  Technical aspects: This EEG study was done with scalp electrodes positioned according to the 10-20 International system of electrode placement. Electrical activity was acquired at a sampling rate of 500Hz  and reviewed with a high frequency filter of 70Hz  and a low frequency filter of 1Hz . EEG data were recorded continuously and digitally stored.   Description: EEG showed continuous generalized predominantly 6 to 7 Hz theta slowing as well as intermittent generalized 2 to 3 Hz delta slowing which was at times rhythmic.  Hyperventilation and photic stimulation were not performed.     ABNORMALITY -Continuous slow, generalized  IMPRESSION: This study is suggestive of moderate diffuse encephalopathy, nonspecific etiology.  No seizures or epileptiform discharges were seen throughout the recording.  Lance [name]

## 2020-05-18 LAB — GLUCOSE, CAPILLARY
Glucose-Capillary: 200 mg/dL — ABNORMAL HIGH (ref 70–99)
Glucose-Capillary: 192 mg/dL — ABNORMAL HIGH (ref 70–99)
Glucose-Capillary: 184 mg/dL — ABNORMAL HIGH (ref 70–99)
Glucose-Capillary: 162 mg/dL — ABNORMAL HIGH (ref 70–99)
Glucose-Capillary: 193 mg/dL — ABNORMAL HIGH (ref 70–99)

## 2020-05-18 MED ORDER — INSULIN ASPART 100 UNIT/ML ~~LOC~~ SOLN
0.0000 [IU] | Freq: Every day | SUBCUTANEOUS | Status: DC
  Administered 2020-05-19 – 2020-05-31 (×4): 2 [IU] via SUBCUTANEOUS
  Administered 2020-06-02 – 2020-06-05 (×4): 3 [IU] via SUBCUTANEOUS
  Administered 2020-06-06: 23:00:00 4 [IU] via SUBCUTANEOUS
  Administered 2020-06-07: 21:00:00 2 [IU] via SUBCUTANEOUS
  Administered 2020-06-10 – 2020-06-11 (×2): 3 [IU] via SUBCUTANEOUS
  Administered 2020-06-12: 2 [IU] via SUBCUTANEOUS
  Administered 2020-06-14: 22:00:00 3 [IU] via SUBCUTANEOUS
  Filled 2020-05-18 (×13): qty 1

## 2020-05-18 MED ORDER — INSULIN ASPART 100 UNIT/ML ~~LOC~~ SOLN
4.0000 [IU] | Freq: Once | SUBCUTANEOUS | Status: AC
  Administered 2020-05-18: 4 [IU] via SUBCUTANEOUS
  Filled 2020-05-18: qty 1

## 2020-05-18 MED ORDER — INSULIN ASPART 100 UNIT/ML ~~LOC~~ SOLN
0.0000 [IU] | Freq: Three times a day (TID) | SUBCUTANEOUS | Status: DC
  Administered 2020-05-18: 3 [IU] via SUBCUTANEOUS
  Administered 2020-05-19: 2 [IU] via SUBCUTANEOUS
  Administered 2020-05-19: 3 [IU] via SUBCUTANEOUS
  Administered 2020-05-20 – 2020-05-23 (×5): 2 [IU] via SUBCUTANEOUS
  Administered 2020-05-23: 18:00:00 3 [IU] via SUBCUTANEOUS
  Administered 2020-05-24: 14:00:00 2 [IU] via SUBCUTANEOUS
  Administered 2020-05-25 – 2020-05-27 (×5): 3 [IU] via SUBCUTANEOUS
  Administered 2020-05-27: 12:00:00 2 [IU] via SUBCUTANEOUS
  Administered 2020-05-28: 17:00:00 5 [IU] via SUBCUTANEOUS
  Administered 2020-05-28: 09:00:00 2 [IU] via SUBCUTANEOUS
  Administered 2020-05-28 – 2020-05-30 (×4): 5 [IU] via SUBCUTANEOUS
  Administered 2020-05-30: 08:00:00 2 [IU] via SUBCUTANEOUS
  Administered 2020-05-31: 18:00:00 3 [IU] via SUBCUTANEOUS
  Administered 2020-05-31 – 2020-06-01 (×2): 5 [IU] via SUBCUTANEOUS
  Administered 2020-06-01 – 2020-06-02 (×2): 3 [IU] via SUBCUTANEOUS
  Administered 2020-06-02 – 2020-06-03 (×4): 5 [IU] via SUBCUTANEOUS
  Administered 2020-06-04: 18:00:00 3 [IU] via SUBCUTANEOUS
  Administered 2020-06-04: 09:00:00 2 [IU] via SUBCUTANEOUS
  Administered 2020-06-04: 13:00:00 5 [IU] via SUBCUTANEOUS
  Administered 2020-06-05: 12:00:00 2 [IU] via SUBCUTANEOUS
  Administered 2020-06-05: 19:00:00 5 [IU] via SUBCUTANEOUS
  Administered 2020-06-05: 09:00:00 11 [IU] via SUBCUTANEOUS
  Administered 2020-06-06: 09:00:00 2 [IU] via SUBCUTANEOUS
  Administered 2020-06-06: 12:00:00 5 [IU] via SUBCUTANEOUS
  Administered 2020-06-06: 18:00:00 3 [IU] via SUBCUTANEOUS
  Administered 2020-06-07 (×2): 5 [IU] via SUBCUTANEOUS
  Administered 2020-06-07 – 2020-06-08 (×2): 2 [IU] via SUBCUTANEOUS
  Administered 2020-06-08: 17:00:00 11 [IU] via SUBCUTANEOUS
  Administered 2020-06-08: 12:00:00 5 [IU] via SUBCUTANEOUS
  Administered 2020-06-09: 2 [IU] via SUBCUTANEOUS
  Administered 2020-06-09 (×2): 5 [IU] via SUBCUTANEOUS
  Administered 2020-06-10: 08:00:00 3 [IU] via SUBCUTANEOUS
  Administered 2020-06-10 – 2020-06-11 (×2): 8 [IU] via SUBCUTANEOUS
  Administered 2020-06-11: 3 [IU] via SUBCUTANEOUS
  Administered 2020-06-12 (×2): 5 [IU] via SUBCUTANEOUS
  Administered 2020-06-13 (×2): 3 [IU] via SUBCUTANEOUS
  Administered 2020-06-13: 5 [IU] via SUBCUTANEOUS
  Administered 2020-06-14: 3 [IU] via SUBCUTANEOUS
  Administered 2020-06-14: 09:00:00 2 [IU] via SUBCUTANEOUS
  Administered 2020-06-14 – 2020-06-15 (×2): 5 [IU] via SUBCUTANEOUS
  Filled 2020-05-18 (×63): qty 1

## 2020-05-18 NOTE — Progress Notes (Addendum)
Inpatient Diabetes Program Recommendations  AACE/ADA: New Consensus Statement on Inpatient Glycemic Control (2015)  Target Ranges:  Prepandial:   less than 140 mg/dL      Peak postprandial:   less than 180 mg/dL (1-2 hours)      Critically ill patients:  140 - 180 mg/dL   Lab Results  Component Value Date   GLUCAP 200 (H) 05/18/2020   HGBA1C 10.8 (H) 05/14/2020    Review of Glycemic Control Results for Lance [name], Lance [name] (MRN [id number]) as of 05/18/2020 11:55  Ref. Range 05/17/2020 07:28 05/17/2020 11:53 05/17/2020 15:48 05/17/2020 22:16 05/18/2020 07:12  Glucose-Capillary Latest Ref Range: 70 - 99 mg/dL 95 185 (H) 168 (H) 210 (H) 200 (H)  Diabetes history: DM 2 Outpatient Diabetes medications: Levemir 10 units daily (pt. Not taking), Metformin 1000 mg bid (not taking), Januvia 100 mg daily (not taking) Current orders for Inpatient glycemic control:  Novolog resistant tid with meals Levemir 10 units q HS Inpatient Diabetes Program Recommendations:   Consider increasing Levemir to 15 units q HS. Please reduce Novolog correction to moderate tid with meals.   Thanks  Adah Perl, RN, BC-ADM Inpatient Diabetes Coordinator Pager [phone] (8a-5p)

## 2020-05-18 NOTE — Care Management Important Message (Signed)
Important Message  Patient Details  Name: Lance [name] MRN: [id number] Date of Birth: 12-Apr-1946   Medicare Important Message Given:  Yes     Dannette Barbara 05/18/2020, 1:18 PM

## 2020-05-18 NOTE — Progress Notes (Signed)
NEUROLOGY CONSULTATION PROGRESS NOTE   Date of service: May 18, 2020 Patient Name: Lance [name] MRN:  [id number] DOB:  14-Jan-1946  Brief HPI  Lance [name] is a 74 y.o. male with PMH significant for DM2, Hep C, Schizophrenia who is admitted with being found down by his neighbors. Found to have R frontal meningioma with mild R frontal mass effect and trace convexity SDH which seems to be likely traumatic. Also has findings concerning for amyloid angiopathy. UDS positive for cocaine.   Interval Hx   He is more calm and cooperative today.  No acute events overnight.  Routine EEG with no seizures or epileptiform discharges.  Vitals   Vitals:   05/17/20 1122 05/17/20 1500 05/17/20 2047 05/18/20 0706  BP: (!) 162/77 135/87 (!) 141/82 132/80  Pulse: 86 87 89 77  Resp: 17 18 18 18   Temp: 97.9 F (36.6 C) 97.8 F (36.6 C) 98.9 F (37.2 C) 98.7 F (37.1 C)  TempSrc:      SpO2: 100% 100% 99% 100%  Weight:      Height:         Body mass index is 26.18 kg/m.  Physical Exam   General: Laying comfortably in bed; in no acute distress.  HENT: Normal oropharynx and mucosa. Normal external appearance of ears and nose.  Neck: Supple, no pain or tenderness  CV: No JVD. No peripheral edema.  Pulmonary: Symmetric Chest rise. Normal respiratory effort.  Abdomen: Soft to touch, non-tender.  Ext: No cyanosis, edema, or deformity  Skin: No rash. Normal palpation of skin.   Musculoskeletal: Normal digits and nails by inspection. No clubbing.   Neurologic Examination   Limited exam completed today: Mental status/Cognition: Alert, oriented to self, place, recognizes family members, poor attention. Speech/language: Fluent, comprehension intact  Cranial nerves:   CN II Pupils equal and reactive to light.   CN III,IV,VI Looks left and right.   CN V    CN VII no asymmetry, no nasolabial fold flattening   CN VIII normal hearing to speech   CN IX & X    CN XI    CN XII    Motor:   Moves all extremities spontaneously and antigravity atleast.  Labs   Basic Metabolic Panel:  Lab Results  Component Value Date   NA 138 05/17/2020   K 3.5 05/17/2020   CO2 24 05/17/2020   GLUCOSE 176 (H) 05/17/2020   BUN 16 05/17/2020   CREATININE 1.04 05/17/2020   CALCIUM 8.7 (L) 05/17/2020   GFRNONAA >60 05/17/2020   GFRAA >60 10/28/2019   HbA1c:  Lab Results  Component Value Date   HGBA1C 10.8 (H) 05/14/2020   LDL: No results found for: [facility] Urine Drug Screen:     Component Value Date/Time   LABOPIA NONE DETECTED 05/14/2020 1720   COCAINSCRNUR POSITIVE (A) 05/14/2020 1720   LABBENZ NONE DETECTED 05/14/2020 1720   AMPHETMU NONE DETECTED 05/14/2020 1720   THCU NONE DETECTED 05/14/2020 1720   LABBARB NONE DETECTED 05/14/2020 1720    Alcohol Level     Component Value Date/Time   ETH <10 05/14/2020 1833   No results found for: PHENYTOIN, ZONISAMIDE, LAMOTRIGINE, LEVETIRACETA No results found for: PHENYTOIN, PHENOBARB, VALPROATE, CBMZ  Imaging and Diagnostic studies   MRI Brain without contrast: Small or trace bilateral subdural hematomas, likely post traumatic, 6 cm long axis right anterior frontal convexity Meningioma with extension to the midline, invasion and occlusion of the anterior-most superior sagittal sinus but the mid and  posterior aspects of that sinus, as well as the other major dural venous structures appear to remain patent.. Associated mild mass effect on the anterior right frontal lobe and possibly mild leptomeningeal thickening but no definite cerebral edema. Innumerable chronic microhemorrhages in the brain suggesting Amyloid Angiopathy  Routine EEG: This study suggestive of moderate diffuse encephalopathy, nonspecific etiology.  No seizures or epileptiform discharges were seen throughout the recording.  Impression   Lance [name] is a 74 y.o. male with PMH significant for DM2, Hep C, Schizophrenia who is admitted with being found down by his  neighbors. His neurologic examination is notable for cognitive impairment but no focal deficit.  MRI Brain with R frontal meningioma with mild mass effect on the R frontal lobe, trace SDH probably due to trauma. Chronic lobar microhemorrhages concerning for amyloid angiopathy. rEEG was negative for any epileptiform discharges.  Recommendations  - Continue Depakote 500mg  BID - continue PO Thiamine and B12 replacement. - Again reiterated that he should not use cocaine. With his amyloid angiopathy, he is VERY HIGH RISK of ICH with cocaine use. - Continue Multivitamin PO. - Follow up with outpatient neurologist in 6-8 weeks for concern for underlying cognitive dysfunction. ______________________________________________________________________   Thank you for the opportunity to take part in the care of this patient. If you have any further questions, please contact the neurology consultation attending.  Signed,  [place] Pager Number [phone]

## 2020-05-18 NOTE — Evaluation (Signed)
Occupational Therapy Evaluation Patient Details Name: Lance [name] MRN: [id number] DOB: 1945-10-11 Today's Date: 05/18/2020    History of Present Illness 74 y.o. male with PMH significant for DM2, Hep C, Schizophrenia who is admitted with being found down by his neighbors. Found to have R frontal meningioma with mild R frontal mass effect and trace convexity SDH which seems to be likely traumatic. Also has findings concerning for amyloid angiopathy. UDS positive for cocaine   Clinical Impression   Patient presenting with decreased cognition, decreased I in self care, balance, functional mobility/transfer, endurance, and safety awareness.  Patient unable to verbalize home set up or PTA. No family present to verbalize baseline. Per chart, pt lives alone and uses [facility] for mobility and performs all aspects of self care independently.  Patient currently functioning at min - mod A for functional tasks and mobility. Pt falling in and out of sleep when in bed. Pt standing from bed and staggering L <> R with min - mod A for balance to assist pt to bathroom for toileting needs. Pt is inconsistent with following 1 step commands and needs increased time to process. Patient will benefit from acute OT to increase overall independence in the areas of ADLs, functional mobility, and safety awareness in order to safely discharge to next venue of care.    Follow Up Recommendations  SNF;Supervision/Assistance - 24 hour    Equipment Recommendations  Other (comment) (defer to next venue of care)       Precautions / Restrictions Precautions Precautions: Fall Restrictions Weight Bearing Restrictions: No      Mobility Bed Mobility Overal bed mobility: Needs Assistance Bed Mobility: Rolling;Supine to Sit;Sit to Supine Rolling: Min guard   Supine to sit: Min assist Sit to supine: Min assist        Transfers Overall transfer level: Needs assistance Equipment used: None Transfers: Sit to/from  [facility] Sit to Stand: Min assist Stand pivot transfers: Min assist;Mod assist       General transfer comment: Pt with a very staggered gait pattern requiring min - mod A for balance. Pt with LOB to the L and R this session while ambulating to commode without use of RW.    Balance Overall balance assessment: Needs assistance Sitting-balance support: Feet supported Sitting balance-Leahy Scale: Fair     Standing balance support: During functional activity Standing balance-Leahy Scale: Poor Standing balance comment: min - mod A for therapist for safety        ADL either performed or assessed with clinical judgement   ADL Overall ADL's : Needs assistance/impaired Eating/Feeding: Set up;Sitting   Grooming: Wash/dry hands;Wash/dry face;Oral care;Sitting;Set up           Upper Body Dressing : Minimal assistance;Sitting Upper Body Dressing Details (indicate cue type and reason): to don coat from home Lower Body Dressing: Moderate assistance Lower Body Dressing Details (indicate cue type and reason): assistance to don B socks while seated on EOB Toilet Transfer: Moderate assistance;Regular Toilet   Toileting- Clothing Manipulation and Hygiene: Minimal assistance;Sit to/from stand          Vision Baseline Vision/History: Wears glasses Patient Visual Report: No change from baseline              Pertinent Vitals/Pain Pain Assessment: Faces Faces Pain Scale: No hurt     Hand Dominance Right   Extremity/Trunk Assessment Upper Extremity Assessment Upper Extremity Assessment: Overall WFL for tasks assessed   Lower Extremity Assessment Lower Extremity Assessment: Defer to PT  evaluation       Communication Communication Communication: No difficulties   Cognition Arousal/Alertness: Lethargic Behavior During Therapy: Flat affect Overall Cognitive Status: Impaired/Different from baseline Area of Impairment: Orientation;Attention;Following  commands;Safety/judgement;Awareness;Problem solving      Orientation Level: Disoriented to;Place;Time;Situation Current Attention Level: Focused   Following Commands: Follows one step commands with increased time;Follows one step commands inconsistently Safety/Judgement: Decreased awareness of safety;Decreased awareness of deficits Awareness: Emergent Problem Solving: Slow processing;Decreased initiation;Difficulty sequencing;Requires verbal cues General Comments: Pt is very confused this session and oriented to self only. Mod - max mulitmodal cuing for safety awareness. Pt impulsive with mobility.              Home Living Family/patient expects to be discharged to:: Private residence Living Arrangements: [place]: Kasandra Knudsen - single point   Additional Comments: Pt unable to verbalize home set up. Per chart review, pt lives at home alone and he does endorse using [facility] at home.      Prior Functioning/Environment Level of Independence: Independent with assistive device(s)            OT Problem List: Decreased strength;Decreased activity tolerance;Decreased cognition;Impaired balance (sitting and/or standing);Decreased safety awareness      OT Treatment/Interventions: Self-care/ADL training;Therapeutic exercise;Balance training;Energy conservation;Therapeutic activities;DME and/or AE instruction;Patient/family education    OT Goals(Current goals can be found in the care plan section) Acute Rehab OT Goals Patient Stated Goal: none stated OT Goal Formulation: With patient Time For Goal Achievement: 06/01/20 Potential to Achieve Goals: Good ADL Goals Pt Will Perform Grooming: with supervision Pt Will Transfer to Toilet: with supervision Pt Will Perform Toileting - Clothing Manipulation and hygiene: with supervision Pt Will Perform Tub/Shower Transfer: with supervision  OT Frequency: Min 1X/week   Barriers to D/C: Other (comment)  unsure of caregiver  support at discharge.          AM-PAC OT "6 Clicks" Daily Activity     Outcome Measure Help from another person eating meals?: A Little Help from another person taking care of personal grooming?: A Little Help from another person toileting, which includes using toliet, bedpan, or urinal?: A Lot Help from another person bathing (including washing, rinsing, drying)?: A Lot Help from another person to put on and taking off regular upper body clothing?: A Little Help from another person to put on and taking off regular lower body clothing?: A Lot 6 Click Score: 15   End of Session Nurse Communication: Mobility status;Precautions  Activity Tolerance: Patient limited by fatigue Patient left: in bed;with call bell/phone within reach;with bed alarm set;with nursing/sitter in room  OT Visit Diagnosis: Unsteadiness on feet (R26.81);Repeated falls (R29.6);Muscle weakness (generalized) (M62.81)                Time: 3474-2595 OT Time Calculation (min): 24 min Charges:  OT General Charges $OT Visit: 1 Visit OT Evaluation $OT Eval Low Complexity: 1 Low OT Treatments $Self Care/Home Management : 8-22 mins  Darleen Crocker, MS, OTR/L , CBIS ascom [phone]  05/18/20, 1:01 PM

## 2020-05-18 NOTE — Progress Notes (Signed)
PROGRESS NOTE  Lance [name] OFB:[id number] DOB: [birth year] DOA: 05/14/2020 PCP: Theotis Burrow, MD  Brief History   Lance [name] is a 74 y.o. male with medical history significant for hepatitis C was treated with Harvoni, insulin-dependent diabetes mellitus presented to the emergency department for chief concerns of being found unresponsive/altered mentation by his neighbors or friends.  No one is at bedside on my evaluation.  Patient does not want anyone to be called.  He does not know what happened.  He reports that he ate dinner today.  He denies anything out of the ordinary.  He denies drug use on day of admission.  He endorses compliance with medications.  ROS was negative for headache, vision changes, chest pain, shortness of breath, nausea, vomiting, dysphagia, fever, chills, dysuria, hematuria, diarrhea, weakness in legs, appetite changes, unintentional weight loss.   Social history: lives by himself. Endorses cocaine use and that the last time he used cocaine was about 1 week ago. He lasted drink etoh 1 week ago.   [facility] were consulted to admit the patient for further evaluation and treatment.   The patient tells me that he only remembers waking up on the ground. He states that he had no warning that he was going to pass out. No chest pain or shortness of breath prior to event. Feels fine now.  MRI brain demonstrates: Small or trace bilateral subdural hematomas, likely post traumatic,6 cm long axis right anterior frontal convexity Meningioma with extension to the midline, invasion and occlusion of the anterior-most superior sagittal sinusbutthe mid and posterior aspects of that sinus, as well as the other major dural venous structures appear to remain patent.. 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  Psychiatry has evaluated the patient and has found him to have subacute delirium, but not to require inpatient psychiatric placement at this time. No mental health treatment is required in their opinion other than substance abuse referral.  Neurology has signed off. They have recommended follow up with neurosurgery for his meningioma. Continue depakote. No further cocaine.   The patient has been evaluated by PT/OT. They have recommended SNF placement.  Consultants  . Neurology . Psychiatry  Procedures  . EEG  Antibiotics   Anti-infectives (From admission, onward)   Start     Dose/Rate Route Frequency Ordered Stop   05/15/20 1230  Ledipasvir-Sofosbuvir 90-400 MG TABS 1 tablet        1 tablet Oral Daily 05/15/20 1223     05/15/20 0900  vancomycin (VANCOREADY) IVPB 750 mg/150 mL  Status:  Discontinued        750 mg 150 mL/hr over 60 Minutes Intravenous Every 12 hours 05/14/20 2251 05/16/20 1809   05/15/20 0800  ceFEPIme (MAXIPIME) 2 g in sodium chloride 0.9 % 100 mL IVPB  Status:  Discontinued        2 g 200 mL/hr over 30 Minutes Intravenous Every 12 hours 05/14/20 2250 05/15/20 1321   05/14/20 2300  vancomycin (VANCOCIN) IVPB 1000 mg/200 mL premix        1,000 mg 200 mL/hr over 60 Minutes Intravenous  Once 05/14/20 2250 05/15/20 0156   05/14/20 2245  ceFEPIme (MAXIPIME) 2 g in sodium chloride 0.9 % 100 mL IVPB  Status:  Discontinued        2 g 200 mL/hr over 30 Minutes Intravenous  Once  05/14/20 2244 05/14/20 2248   05/14/20 2245  vancomycin (VANCOCIN) IVPB 1000 mg/200 mL premix  Status:  Discontinued        1,000 mg 200 mL/hr over 60 Minutes Intravenous  Once 05/14/20 2244 05/14/20 2248   05/14/20 2245  metroNIDAZOLE (FLAGYL) tablet 500 mg  Status:  Discontinued        500 mg Oral Every 8 hours 05/14/20 2244 05/14/20 2248   05/14/20 1930  ceFEPIme (MAXIPIME) 2 g in sodium chloride 0.9 % 100 mL IVPB        2 g 200 mL/hr over 30  Minutes Intravenous  Once 05/14/20 1923 05/14/20 2106   05/14/20 1930  metroNIDAZOLE (FLAGYL) IVPB 500 mg        500 mg 100 mL/hr over 60 Minutes Intravenous  Once 05/14/20 1923 05/14/20 2106   05/14/20 1930  vancomycin (VANCOCIN) IVPB 1000 mg/200 mL premix        1,000 mg 200 mL/hr over 60 Minutes Intravenous  Once 05/14/20 1923 05/14/20 2219      Subjective  During my visit the patient is lying patiently in his bed. He is cooperative, oriented and pleasant. No new complaints.  Objective   Vitals:  Vitals:   05/18/20 1159 05/18/20 1527  BP: 133/86 123/75  Pulse: 81 93  Resp: 15 14  Temp: 98.6 F (37 C) 98.8 F (37.1 C)  SpO2: 100% 99%   Exam:  Constitutional:  . The patient is awake, alert, and oriented x 3. No acute distress. Respiratory:  . No increased work of breathing. . No wheezes, rales, or rhonchi . No tactile fremitus Cardiovascular:  . Regular rate and rhythm . No murmurs, ectopy, or gallups. . No lateral PMI. No thrills. Abdomen:  . Abdomen is soft, non-tender, non-distended . No hernias, masses, or organomegaly . Normoactive bowel sounds.  Musculoskeletal:  . No cyanosis, clubbing, or edema Skin:  . No rashes, lesions, ulcers . palpation of skin: no induration or nodules Neurologic:  . CN 2-12 intact . Sensation all 4 extremities intact Psychiatric:  . Mental status o Mood, affect appropriate o Orientation to person, place, time  . judgment and insight appear intact  I have personally reviewed the following:   Today's Data  . Vitals, CMP, CBC  Micro Data  . Blood cultures x 2: No growth  Imaging  . CT head . MRI Brain  Cardiology Data  . EKG . Echocardiogram pending  Other Data  . EEG  Scheduled Meds: . buprenorphine-naloxone  1 tablet Sublingual Daily  . Chlorhexidine Gluconate Cloth  6 each Topical Daily  . citalopram  10 mg Oral Daily  . divalproex  500 mg Oral Q12H  . ferrous sulfate  325 mg Oral Q breakfast  . insulin  aspart  0-15 Units Subcutaneous TID WC  . insulin aspart  0-5 Units Subcutaneous QHS  . insulin detemir  10 Units Subcutaneous Q2200  . Ledipasvir-Sofosbuvir  1 tablet Oral Daily  . multivitamin with minerals  1 tablet Oral Daily  . OLANZapine  5 mg Oral QHS  . tamsulosin  0.8 mg Oral Daily  . [START ON 05/23/2020] thiamine injection  100 mg Intravenous Daily  . vitamin B-12  1,000 mcg Oral Daily   Continuous Infusions: . thiamine injection 250 mg (05/18/20 1100)    Principal Problem:   Subacute delirium Active Problems:   Sepsis ([place])   Syncope   LOS: 3 days   A & P  Altered level of consciousness: The patient  has had swings of behavior between being cooperative and lucid to being confused, agitated aond combative. Concern for seizure activity. I have discussed the patient with Neurology. Seizure activity vs encephalopathy/delirium secondary to alcohol and cocaine abuse.   Sepsis: Doubt. Leukocytosis - due to demargination/reactive. Lactic acid - due to syncope/seizure/cocaine abuse. Resolved. No fever, hypotension, or likely source of infection. Procalcitonin negative. Antibiotics stopped.   Syncope/Seizure/AlteredMentalStatus/Post ictal period: Seizure would explain lactic acidosis, decreased level of consciousness, and leukocytosis. Possibly due to cocaine abuse and/or meningioma in the f. Echocardiogram ordered to evaluate for syncope. The patient is being monitored on telemetry. I have discussed the patient with Dr. Lorrin Goodell. He will consult and he has ordered an EEG. He recommends that Keppra be initiated if the patient has further episodes. Keppra has been changed to depakote. Dr. Sarita Haver feels that the patient's presentation was caused by meningioma with vasogenic edema placing pressure on the right frontal lobe. This in the setting of syncope likely due to cocaine abuse and then SDH caused by fall.  Insulin-dependent diabetes mellitus-resumed home insulin. FSBS for the  past 24 hours has run 168 - 210. HbA1c is 10.8.   HIV: Continue antiretroviral regimen.  Debility: PT/OT have recommended SNF. TOC consult has been placed.   I have seen and examined the patient myself. I have spent 32 minutes in his evaluation and care.  DVT prophylaxis: enoxaparin  Code Status: full code Diet: Carb modified/heart healthy Family Communication: he does not want anyone updated Disposition Plan: Pending clinical course/Pt/OT eval.   Aloysious Vangieson, DO [facility] Direct contact: see [email or web address]  7PM-7AM contact night coverage as above 05/18/2020, 3:55 PM  LOS: 0 days

## 2020-05-18 NOTE — Evaluation (Signed)
Physical Therapy Evaluation Patient Details Name: Lance [name] MRN: [id number] DOB: Feb 20, 1946 Today's Date: 05/18/2020   History of Present Illness  74 y.o. male with PMH significant for DM2, Hep C, Schizophrenia who is admitted with being found down by his neighbors. Found to have R frontal meningioma with mild R frontal mass effect and trace convexity SDH which seems to be likely traumatic. Also has findings concerning for amyloid angiopathy. UDS positive for cocaine    Clinical Impression  Pt is a 74 year old M admitted to hospital on 05/14/20 for subacute delirium. Unable to obtain subjective/PLOF as pt was only oriented to himself and could intermittently follow simple one-step commands. Per chart review, pt lives alone and has used [facility] for ambulation in the past. Pt presents with confusion, flat affect, poor safety awareness, impulsivity, functional weakness, decreased activity tolerance, and decreased balance resulting in impaired functional mobility. Due to deficits, pt required max cueing for safety/participation with therapy as well as min guard for bed mobility, min assist for transfers, and mod assist for short distance ambulation with BUE support on PT/sink. Further mobility limited secondary to pt impaired cognition and poor safety awareness. Deficits limit the pt's ability to safely and independently perform ADL's, transfer, and ambulate. Pt will benefit from acute skilled PT services to address deficits for return to baseline function. At this time, PT recommends SNF at [place] to improve overall safety with functional mobility prior to return home.     Follow Up Recommendations SNF;Supervision/Assistance - 24 hour    Equipment Recommendations  Other (comment) (Will defer to postacute facility)    Recommendations for Other Services       Precautions / Restrictions Precautions Precautions: Fall Restrictions Weight Bearing Restrictions: No      Mobility  Bed Mobility Overal  bed mobility: Needs Assistance Bed Mobility: Supine to Sit Rolling: Min guard   Supine to sit: Min guard;HOB elevated Sit to supine: Min guard;HOB elevated   General bed mobility comments: Min guard for safety supine<>sit transfer with HOB elevated and use of BUE for support. Pt required increased time/effort to perform mobility.    Transfers Overall transfer level: Needs assistance Equipment used: 1 person hand held assist Transfers: Sit to/from Stand Sit to Stand: Min assist;From elevated surface Stand pivot transfers: Min assist;Mod assist       General transfer comment: Min assist for sit<>stand transfer from elevated bed height with L HHA. Pt required increased time/effort to perform mobility.  Ambulation/Gait Ambulation/Gait assistance: Mod assist Gait Distance (Feet): 6 Feet Assistive device: 1 person hand held assist       General Gait Details: Pt required mod assist to ambulate short distance at bedside with L HHA. Pt demonstrated forward flexed posture with reaching out for sink for support. Pt with decreased step length/foot clearance bil, and narrow BOS. Pt required mod cueing for continued mobility and hand placement for safety.     Balance Overall balance assessment: Needs assistance Sitting-balance support: Feet unsupported;No upper extremity supported Sitting balance-Leahy Scale: Fair Sitting balance - Comments: Good seated balance at EOB as pt was able to perform anterior lean to adjust socks, without LOB.   Standing balance support: During functional activity;Bilateral upper extremity supported Standing balance-Leahy Scale: Poor Standing balance comment: Poor standing balance during ambulation with BUE support, requiring mod assist for safety/balance.  Pertinent Vitals/Pain Pain Assessment: No/denies pain Faces Pain Scale: No hurt    Home Living Family/patient expects to be discharged to:: Private  residence Living Arrangements: Alone             Home Equipment: Kasandra Knudsen - single point Additional Comments: Pt unable to communicate home set up due to AMS and lethargy; per chart review, pt lives at home alone and has used [facility] for ambulation.    Prior Function Level of Independence: Independent with assistive device(s)               Hand Dominance   Dominant Hand: Right    Extremity/Trunk Assessment   Upper Extremity Assessment Upper Extremity Assessment: Overall WFL for tasks assessed    Lower Extremity Assessment Lower Extremity Assessment: Overall WFL for tasks assessed (Grossly 4+/5)    Cervical / Trunk Assessment Cervical / Trunk Assessment: Normal  Communication   Communication: No difficulties  Cognition Arousal/Alertness: Lethargic Behavior During Therapy: Flat affect Overall Cognitive Status: Impaired/Different from baseline Area of Impairment: Orientation;Attention;Following commands;Safety/judgement;Awareness;Problem solving                 Orientation Level: Disoriented to;Place;Time;Situation Current Attention Level: Focused   Following Commands: Follows one step commands with increased time;Follows one step commands inconsistently Safety/Judgement: Decreased awareness of safety;Decreased awareness of deficits Awareness: Emergent Problem Solving: Slow processing;Decreased initiation;Difficulty sequencing;Requires verbal cues General Comments: Pt only oriented to self and unable to state location, situation, or time despite max cues. Pt required max multimodal cues throughout session for safety with mobility due to impulsivity.      General Comments      Exercises Other Exercises Other Exercises: Pt able to perform bed mobility, transfers, and short distance ambulation at bedside with increased assistance for transfers/ambulation due to poor safety awareness and functional weakness.   Assessment/Plan    PT Assessment Patient needs  continued PT services  PT Problem List Decreased mobility;Decreased safety awareness;Decreased coordination;Decreased activity tolerance;Decreased cognition;Decreased balance       PT Treatment Interventions Therapeutic exercise;Gait training;Balance training;Stair training;Neuromuscular re-education;Functional mobility training;Therapeutic activities    PT Goals (Current goals can be found in the Care Plan section)  Acute Rehab PT Goals Patient Stated Goal: None stated    Frequency Min 2X/week   Barriers to discharge Decreased caregiver support         AM-PAC PT "6 Clicks" Mobility  Outcome Measure Help needed turning from your back to your side while in a flat bed without using bedrails?: A Little Help needed moving from lying on your back to sitting on the side of a flat bed without using bedrails?: A Little Help needed moving to and from a bed to a chair (including a wheelchair)?: A Lot Help needed standing up from a chair using your arms (e.g., wheelchair or bedside chair)?: A Little Help needed to walk in hospital room?: A Lot Help needed climbing 3-5 steps with a railing? : Total 6 Click Score: 14    End of Session Equipment Utilized During Treatment: Gait belt Activity Tolerance: Patient tolerated treatment well;Patient limited by lethargy Patient left: in bed;with call bell/phone within reach;with nursing/sitter in room Nurse Communication: Mobility status PT Visit Diagnosis: Unsteadiness on feet (R26.81);Difficulty in walking, not elsewhere classified (R26.2)    Time: 4818-5631 PT Time Calculation (min) (ACUTE ONLY): 14 min   Charges:   PT Evaluation $PT Eval Moderate Complexity: 1 Mod          Herminio Commons, PT, DPT 3:43  PM,05/18/20

## 2020-05-19 LAB — CULTURE, BLOOD (ROUTINE X 2)
Culture: NO GROWTH
Culture: NO GROWTH
Special Requests: ADEQUATE
Special Requests: ADEQUATE

## 2020-05-19 LAB — METHYLMALONIC ACID, SERUM: Methylmalonic Acid, Quantitative: 164 nmol/L (ref 0–378)

## 2020-05-19 LAB — BASIC METABOLIC PANEL
Anion gap: 9 (ref 5–15)
BUN: 14 mg/dL (ref 8–23)
CO2: 27 mmol/L (ref 22–32)
Calcium: 9 mg/dL (ref 8.9–10.3)
Chloride: 102 mmol/L (ref 98–111)
Creatinine, Ser: 0.99 mg/dL (ref 0.61–1.24)
GFR, Estimated: >60 mL/min (ref >60)
Glucose, Bld: 202 mg/dL — ABNORMAL HIGH (ref 70–99)
Potassium: 4.4 mmol/L (ref 3.5–5.1)
Sodium: 138 mmol/L (ref 135–145)

## 2020-05-19 LAB — GLUCOSE, CAPILLARY
Glucose-Capillary: 208 mg/dL — ABNORMAL HIGH (ref 70–99)
Glucose-Capillary: 238 mg/dL — ABNORMAL HIGH (ref 70–99)
Glucose-Capillary: 158 mg/dL — ABNORMAL HIGH (ref 70–99)
Glucose-Capillary: 102 mg/dL — ABNORMAL HIGH (ref 70–99)
Glucose-Capillary: 145 mg/dL — ABNORMAL HIGH (ref 70–99)
Glucose-Capillary: 210 mg/dL — ABNORMAL HIGH (ref 70–99)

## 2020-05-19 LAB — VITAMIN B1: Vitamin B1 (Thiamine): 100.3 nmol/L (ref 66.5–200.0)

## 2020-05-19 MED ORDER — ENOXAPARIN SODIUM 40 MG/0.4ML ~~LOC~~ SOLN
40.0000 mg | SUBCUTANEOUS | Status: DC
  Administered 2020-05-20 – 2020-06-14 (×26): 40 mg via SUBCUTANEOUS
  Filled 2020-05-19 (×25): qty 0.4

## 2020-05-19 MED ORDER — OLANZAPINE 10 MG PO TABS
10.0000 mg | ORAL_TABLET | Freq: Every day | ORAL | Status: DC
  Administered 2020-05-19 – 2020-05-21 (×3): 10 mg via ORAL
  Filled 2020-05-19 (×4): qty 1

## 2020-05-19 NOTE — NC FL2 (Signed)
[place] LEVEL OF CARE SCREENING TOOL     IDENTIFICATION  Patient Name: Lance [name] Birthdate: 08-09-1945 Sex: male Admission Date (Current Location): 05/14/2020  [place] and [place] Number:  Engineering geologist and Address:  [facility], [address], [place], [place] [address]      Provider Number: [id number]  Attending Physician Name and Address:  Karie Kirks, DO  Relative Name and Phone Number:       Current Level of Care: Hospital Recommended Level of Care: [place] Prior Approval Number:    Date Approved/Denied:   PASRR Number: [id number]  Discharge Plan: SNF    Current Diagnoses: Patient Active Problem List   Diagnosis Date Noted  . Subacute delirium 05/16/2020  . Syncope 05/15/2020  . Sepsis ([place]) 05/14/2020  . Leg pain 12/07/2018  . PAD (peripheral artery disease) ([place]) 12/07/2018  . Acute upper GI bleed 10/10/2016  . Hypotension   . Polysubstance abuse ([place]) 03/10/2015  . Mass of arm 03/10/2015  . 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 03/10/2015  . Perianal abscess 08/31/2014  . 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 Adenomatous polyp of colon 08/30/2013  . Type 2 diabetes, controlled, with neuropathy ([place]) 08/26/2013  . Chronic viral hepatitis C ([place]) 03/21/2013  . Need for hepatitis B vaccination 03/21/2013  . Depression 10/23/2012  . IVDU (intravenous drug user) 10/23/2012  . Schizophrenia ([place]) 10/23/2012  . Carpal tunnel syndrome on both sides 07/31/2012  . Diabetic peripheral neuropathy ([place]) 07/31/2012  . Osteoarthritis of both hips 07/31/2012  . Esophageal reflux 10/21/2009  . Diverticulitis of colon 10/21/2009    Orientation RESPIRATION BLADDER Height & Weight     Self,Place  Normal Incontinent Weight: 178 lb (80.7 kg) Height:  5\' 9"  (175.3 cm)  BEHAVIORAL SYMPTOMS/MOOD NEUROLOGICAL BOWEL NUTRITION STATUS      Continent Diet (heart healthy, carb modified)  AMBULATORY STATUS  COMMUNICATION OF NEEDS Skin   Limited Assist Verbally Normal                       Personal Care Assistance Level of Assistance  Bathing,Feeding,Dressing Bathing Assistance: Limited assistance Feeding assistance: Independent Dressing Assistance: Limited assistance     Functional Limitations Info  Hearing,Sight,Speech Sight Info: Adequate Hearing Info: Adequate Speech Info: Adequate    SPECIAL CARE FACTORS FREQUENCY  PT (By licensed PT),OT (By licensed OT)     PT Frequency: 5x OT Frequency: 5x            Contractures Contractures Info: Not present    Additional Factors Info  Code Status,Allergies Code Status Info: Full Code Allergies Info: NO known allergies           Current Medications (05/19/2020):  This is the current hospital active medication list Current Facility-Administered Medications  Medication Dose Route Frequency Provider Last Rate Last Admin  . acetaminophen (TYLENOL) tablet 325 mg  325 mg Oral Q6H PRN Cox, Amy N, DO       Or  . acetaminophen (TYLENOL) suppository 325 mg  325 mg Rectal Q6H PRN Cox, Amy N, DO      . albuterol (PROVENTIL) (2.5 MG/3ML) 0.083% nebulizer solution 2.5 mg  2.5 mg Inhalation Q6H PRN Cox, Amy N, DO      . buprenorphine-naloxone (SUBOXONE) 8-2 mg per SL tablet 1 tablet  1 tablet Sublingual Daily Swayze, Ava, DO   1 tablet at 05/18/20 0821  . Chlorhexidine Gluconate Cloth 2 % PADS 6 each  6 each Topical Daily Sharion Settler, NP   6 each at 05/18/20 [phone]  . citalopram (CELEXA) tablet 10 mg  10 mg Oral Daily Cox, Amy N, DO   10 mg at 05/18/20 4656  . divalproex (DEPAKOTE) DR tablet 500 mg  500 mg Oral Q12H Donnetta Simpers, MD   500 mg at 05/18/20 2144  . ferrous sulfate tablet 325 mg  325 mg Oral Q breakfast Cox, Amy N, DO   325 mg at 05/18/20 8127  . haloperidol lactate (HALDOL) injection 1 mg  1 mg Intravenous Q4H PRN Clapacs, John T, MD      . hydrALAZINE (APRESOLINE) tablet 25 mg  25 mg Oral Q8H PRN Cox, Amy N, DO       . insulin aspart (novoLOG) injection 0-15 Units  0-15 Units Subcutaneous TID WC Swayze, Ava, DO   3 Units at 05/19/20 0911  . insulin aspart (novoLOG) injection 0-5 Units  0-5 Units Subcutaneous QHS Swayze, Ava, DO      . insulin detemir (LEVEMIR) injection 10 Units  10 Units Subcutaneous Q2200 Cox, Amy N, DO   10 Units at 05/18/20 2142  . Ledipasvir-Sofosbuvir 90-400 MG TABS 1 tablet  1 tablet Oral Daily Swayze, Ava, DO      . multivitamin with minerals tablet 1 tablet  1 tablet Oral Daily Cox, Amy N, DO   1 tablet at 05/18/20 5170  . OLANZapine (ZYPREXA) tablet 5 mg  5 mg Oral QHS Cox, Amy N, DO   5 mg at 05/18/20 2141  . ondansetron (ZOFRAN) tablet 4 mg  4 mg Oral Q6H PRN Cox, Amy N, DO       Or  . ondansetron (ZOFRAN) injection 4 mg  4 mg Intravenous Q6H PRN Cox, Amy N, DO      . polyethylene glycol (MIRALAX / GLYCOLAX) packet 17 g  17 g Oral Daily PRN Cox, Amy N, DO      . polyvinyl alcohol (LIQUIFILM TEARS) 1.4 % ophthalmic solution 1 drop  1 drop Both Eyes PRN Swayze, Ava, DO      . tamsulosin (FLOMAX) capsule 0.8 mg  0.8 mg Oral Daily Cox, Amy N, DO   0.8 mg at 05/18/20 0823  . thiamine (B-1) 250 mg in sodium chloride 0.9 % 50 mL IVPB  250 mg Intravenous Daily Donnetta Simpers, MD 100 mL/hr at 05/19/20 0916 250 mg at 05/19/20 0916   Followed by  . [START ON 05/23/2020] thiamine (B-1) injection 100 mg  100 mg Intravenous Daily Donnetta Simpers, MD      . vitamin B-12 (CYANOCOBALAMIN) tablet 1,000 mcg  1,000 mcg Oral Daily Donnetta Simpers, MD   1,000 mcg at 05/18/20 0174     Discharge Medications: Please see discharge summary for a list of discharge medications.  Relevant Imaging Results:  Relevant Lab Results:   Additional Information SSN:  [id number]  Eileen Stanford, LCSW

## 2020-05-19 NOTE — Plan of Care (Signed)
  Problem: Fluid Volume: Goal: Hemodynamic stability will improve 05/19/2020 1256 by Cristela Blue, RN Outcome: Progressing 05/19/2020 1256 by Cristela Blue, RN Outcome: Progressing   Problem: Clinical Measurements: Goal: Diagnostic test results will improve 05/19/2020 1256 by Cristela Blue, RN Outcome: Progressing 05/19/2020 1256 by Cristela Blue, RN Outcome: Progressing Goal: Signs and symptoms of infection will decrease 05/19/2020 1256 by Cristela Blue, RN Outcome: Progressing 05/19/2020 1256 by Cristela Blue, RN Outcome: Progressing   Problem: Respiratory: Goal: Ability to maintain adequate ventilation will improve 05/19/2020 1256 by Cristela Blue, RN Outcome: Progressing 05/19/2020 1256 by Cristela Blue, RN Outcome: Progressing   Problem: Education: Goal: Knowledge of General Education information will improve Description: Including pain rating scale, medication(s)/side effects and non-pharmacologic comfort measures 05/19/2020 1256 by Cristela Blue, RN Outcome: Progressing 05/19/2020 1256 by Cristela Blue, RN Outcome: Progressing   Problem: Health Behavior/Discharge Planning: Goal: Ability to manage health-related needs will improve 05/19/2020 1256 by Cristela Blue, RN Outcome: Progressing 05/19/2020 1256 by Cristela Blue, RN Outcome: Progressing   Problem: Clinical Measurements: Goal: Ability to maintain clinical measurements within normal limits will improve 05/19/2020 1256 by Cristela Blue, RN Outcome: Progressing 05/19/2020 1256 by Cristela Blue, RN Outcome: Progressing Goal: Will remain free from infection 05/19/2020 1256 by Cristela Blue, RN Outcome: Progressing 05/19/2020 1256 by Cristela Blue, RN Outcome: Progressing Goal: Diagnostic test results will improve 05/19/2020 1256 by Cristela Blue, RN Outcome: Progressing 05/19/2020 1256 by Cristela Blue, RN Outcome: Progressing Goal: Respiratory complications will improve 05/19/2020  1256 by Cristela Blue, RN Outcome: Progressing 05/19/2020 1256 by Cristela Blue, RN Outcome: Progressing Goal: Cardiovascular complication will be avoided 05/19/2020 1256 by Cristela Blue, RN Outcome: Progressing 05/19/2020 1256 by Cristela Blue, RN Outcome: Progressing   Problem: Activity: Goal: Risk for activity intolerance will decrease 05/19/2020 1256 by Cristela Blue, RN Outcome: Progressing 05/19/2020 1256 by Cristela Blue, RN Outcome: Progressing   Problem: Nutrition: Goal: Adequate nutrition will be maintained 05/19/2020 1256 by Cristela Blue, RN Outcome: Progressing 05/19/2020 1256 by Cristela Blue, RN Outcome: Progressing   Problem: Coping: Goal: Level of anxiety will decrease 05/19/2020 1256 by Cristela Blue, RN Outcome: Progressing 05/19/2020 1256 by Cristela Blue, RN Outcome: Progressing   Problem: Elimination: Goal: Will not experience complications related to bowel motility 05/19/2020 1256 by Cristela Blue, RN Outcome: Progressing 05/19/2020 1256 by Cristela Blue, RN Outcome: Progressing Goal: Will not experience complications related to urinary retention 05/19/2020 1256 by Cristela Blue, RN Outcome: Progressing 05/19/2020 1256 by Cristela Blue, RN Outcome: Progressing   Problem: Pain Managment: Goal: General experience of comfort will improve 05/19/2020 1256 by Cristela Blue, RN Outcome: Progressing 05/19/2020 1256 by Cristela Blue, RN Outcome: Progressing   Problem: Safety: Goal: Ability to remain free from injury will improve 05/19/2020 1256 by Cristela Blue, RN Outcome: Progressing 05/19/2020 1256 by Cristela Blue, RN Outcome: Progressing   Problem: Skin Integrity: Goal: Risk for impaired skin integrity will decrease 05/19/2020 1256 by Cristela Blue, RN Outcome: Progressing 05/19/2020 1256 by Cristela Blue, RN Outcome: Progressing

## 2020-05-19 NOTE — Consult Note (Signed)
[facility] Consult   Reason for Consult: Follow-up consult patient with delirium history of schizophrenia and substance abuse Referring Physician: Swayze Patient Identification: Lance [name] MRN:  [id number] Principal Diagnosis: Subacute delirium Diagnosis:  Principal Problem:   Subacute delirium Active Problems:   Sepsis ([place])   Syncope   Total Time spent with patient: 30 minutes  Subjective:   Lance [name] is a 74 y.o. male patient admitted with "I am okay".  HPI: Follow-up note for this patient with substance abuse recent delirium possible chronic mental illness.  Found the patient awake and trying to eat supper tonight.  He was clearly confused.  All he did was move items around his tray in a confused manner but was not eating.  Could not answer questions directly.  Seem to be a little more out of it.  Patient may be having more chronic cognitive impairment than was evident last time I talked with him  Past Psychiatric History: Chronic recurrent substance abuse and mental health problems  Risk to Self:   Risk to Others:   Prior Inpatient Therapy:   Prior Outpatient Therapy:    Past Medical History:  Past Medical History:  Diagnosis Date  . Diabetes mellitus without complication ([place])   . Hepatitis C   . Schizophrenia [facility])     Past Surgical History:  Procedure Laterality Date  . ESOPHAGOGASTRODUODENOSCOPY (EGD) WITH PROPOFOL N/A 10/10/2016   Procedure: ESOPHAGOGASTRODUODENOSCOPY (EGD) WITH PROPOFOL;  Surgeon: Lucilla Lame, MD;  Location: [facility] ENDOSCOPY;  Service: Endoscopy;  Laterality: N/A;  . HERNIA REPAIR    . VISCERAL ARTERY INTERVENTION N/A 10/11/2016   Procedure: Visceral Artery Intervention;  Surgeon: Algernon Huxley, MD;  Location: [place] [facility];  Service: Cardiovascular;  Laterality: N/A;   Family History:  Family History  Problem Relation Age of Onset  . Diabetes Sister    Family Psychiatric  History: See previous Social History:   Social History   Substance and Sexual Activity  Alcohol Use Not Currently  . Alcohol/week: 10.0 standard drinks  . Types: 10 Shots of liquor per week     Social History   Substance and Sexual Activity  Drug Use No    Social History   Socioeconomic History  . Marital status: Widowed    Spouse name: Not on file  . Number of children: Not on file  . Years of education: Not on file  . Highest education level: Not on file  Occupational History  . Not on file  Tobacco Use  . Smoking status: Current Every Day Smoker    Packs/day: 0.25  . Smokeless tobacco: Never Used  Vaping Use  . Vaping Use: Never used  Substance and Sexual Activity  . Alcohol use: Not Currently    Alcohol/week: 10.0 standard drinks    Types: 10 Shots of liquor per week  . Drug use: No  . Sexual activity: Yes    Birth control/protection: None  Other Topics Concern  . Not on file  Social History Narrative  . Not on file   Social Determinants of Health   Financial Resource Strain: Not on file  Food Insecurity: Not on file  Transportation Needs: Not on file  Physical Activity: Not on file  Stress: Not on file  Social Connections: Not on file   Additional Social History:    Allergies:  No Known Allergies  Labs:  Results for orders placed or performed during the hospital encounter of 05/14/20 (from the past 48 hour(s))  Glucose, capillary     Status: Abnormal   Collection Time: 05/17/20 10:16 PM  Result Value Ref Range   Glucose-Capillary 210 (H) 70 - 99 mg/dL    Comment: Glucose reference range applies only to samples taken after fasting for at least 8 hours.   Comment 1 Notify RN   Glucose, capillary     Status: Abnormal   Collection Time: 05/18/20  7:12 AM  Result Value Ref Range   Glucose-Capillary 200 (H) 70 - 99 mg/dL    Comment: Glucose reference range applies only to samples taken after fasting for at least 8 hours.   Comment 1 Notify RN   Glucose, capillary     Status: Abnormal    Collection Time: 05/18/20 12:03 PM  Result Value Ref Range   Glucose-Capillary 192 (H) 70 - 99 mg/dL    Comment: Glucose reference range applies only to samples taken after fasting for at least 8 hours.   Comment 1 Notify RN   Glucose, capillary     Status: Abnormal   Collection Time: 05/18/20  4:28 PM  Result Value Ref Range   Glucose-Capillary 184 (H) 70 - 99 mg/dL    Comment: Glucose reference range applies only to samples taken after fasting for at least 8 hours.   Comment 1 Notify RN   Glucose, capillary     Status: Abnormal   Collection Time: 05/18/20  8:22 PM  Result Value Ref Range   Glucose-Capillary 162 (H) 70 - 99 mg/dL    Comment: Glucose reference range applies only to samples taken after fasting for at least 8 hours.  Glucose, capillary     Status: Abnormal   Collection Time: 05/18/20  9:06 PM  Result Value Ref Range   Glucose-Capillary 193 (H) 70 - 99 mg/dL    Comment: Glucose reference range applies only to samples taken after fasting for at least 8 hours.  Glucose, capillary     Status: Abnormal   Collection Time: 05/19/20  1:25 AM  Result Value Ref Range   Glucose-Capillary 208 (H) 70 - 99 mg/dL    Comment: Glucose reference range applies only to samples taken after fasting for at least 8 hours.  Basic metabolic panel     Status: Abnormal   Collection Time: 05/19/20  4:22 AM  Result Value Ref Range   Sodium 138 135 - 145 mmol/L   Potassium 4.4 3.5 - 5.1 mmol/L   Chloride 102 98 - 111 mmol/L   CO2 27 22 - 32 mmol/L   Glucose, Bld 202 (H) 70 - 99 mg/dL    Comment: Glucose reference range applies only to samples taken after fasting for at least 8 hours.   BUN 14 8 - 23 mg/dL   Creatinine, Ser 0.99 0.61 - 1.24 mg/dL   Calcium 9.0 8.9 - 10.3 mg/dL   GFR, Estimated >60 >60 mL/min    Comment: (NOTE) Calculated using the CKD-EPI Creatinine Equation (2021)    Anion gap 9 5 - 15    Comment: Performed at [facility], [address] [place], [place]  [id number]  Glucose, capillary     Status: Abnormal   Collection Time: 05/19/20  4:43 AM  Result Value Ref Range   Glucose-Capillary 238 (H) 70 - 99 mg/dL    Comment: Glucose reference range applies only to samples taken after fasting for at least 8 hours.  Glucose, capillary     Status: Abnormal   Collection Time: 05/19/20  8:02 AM  Result Value  Ref Range   Glucose-Capillary 158 (H) 70 - 99 mg/dL    Comment: Glucose reference range applies only to samples taken after fasting for at least 8 hours.  Glucose, capillary     Status: Abnormal   Collection Time: 05/19/20 12:23 PM  Result Value Ref Range   Glucose-Capillary 102 (H) 70 - 99 mg/dL    Comment: Glucose reference range applies only to samples taken after fasting for at least 8 hours.  Glucose, capillary     Status: Abnormal   Collection Time: 05/19/20  4:25 PM  Result Value Ref Range   Glucose-Capillary 145 (H) 70 - 99 mg/dL    Comment: Glucose reference range applies only to samples taken after fasting for at least 8 hours.    Current Facility-Administered Medications  Medication Dose Route Frequency Provider Last Rate Last Admin  . acetaminophen (TYLENOL) tablet 325 mg  325 mg Oral Q6H PRN Cox, Amy N, DO       Or  . acetaminophen (TYLENOL) suppository 325 mg  325 mg Rectal Q6H PRN Cox, Amy N, DO      . albuterol (PROVENTIL) (2.5 MG/3ML) 0.083% nebulizer solution 2.5 mg  2.5 mg Inhalation Q6H PRN Cox, Amy N, DO      . buprenorphine-naloxone (SUBOXONE) 8-2 mg per SL tablet 1 tablet  1 tablet Sublingual Daily Swayze, Ava, DO   1 tablet at 05/19/20 1131  . Chlorhexidine Gluconate Cloth 2 % PADS 6 each  6 each Topical Daily Sharion Settler, NP   6 each at 05/19/20 1133  . citalopram (CELEXA) tablet 10 mg  10 mg Oral Daily Cox, Amy N, DO   10 mg at 05/19/20 1131  . divalproex (DEPAKOTE) DR tablet 500 mg  500 mg Oral Q12H Donnetta Simpers, MD   500 mg at 05/19/20 1131  . enoxaparin (LOVENOX) injection 40 mg  40 mg Subcutaneous Q24H  Oswald Hillock, [place]      . ferrous sulfate tablet 325 mg  325 mg Oral Q breakfast Cox, Amy N, DO   325 mg at 05/19/20 1131  . haloperidol lactate (HALDOL) injection 1 mg  1 mg Intravenous Q4H PRN Steffanie Mingle T, MD      . hydrALAZINE (APRESOLINE) tablet 25 mg  25 mg Oral Q8H PRN Cox, Amy N, DO      . insulin aspart (novoLOG) injection 0-15 Units  0-15 Units Subcutaneous TID WC Swayze, Ava, DO   2 Units at 05/19/20 1644  . insulin aspart (novoLOG) injection 0-5 Units  0-5 Units Subcutaneous QHS Swayze, Ava, DO      . insulin detemir (LEVEMIR) injection 10 Units  10 Units Subcutaneous Q2200 Cox, Amy N, DO   10 Units at 05/18/20 2142  . Ledipasvir-Sofosbuvir 90-400 MG TABS 1 tablet  1 tablet Oral Daily Swayze, Ava, DO      . multivitamin with minerals tablet 1 tablet  1 tablet Oral Daily Cox, Amy N, DO   1 tablet at 05/19/20 1130  . OLANZapine (ZYPREXA) tablet 10 mg  10 mg Oral QHS Len Azeez T, MD      . ondansetron (ZOFRAN) tablet 4 mg  4 mg Oral Q6H PRN Cox, Amy N, DO       Or  . ondansetron (ZOFRAN) injection 4 mg  4 mg Intravenous Q6H PRN Cox, Amy N, DO      . polyethylene glycol (MIRALAX / GLYCOLAX) packet 17 g  17 g Oral Daily PRN Cox, Amy N, DO      .  polyvinyl alcohol (LIQUIFILM TEARS) 1.4 % ophthalmic solution 1 drop  1 drop Both Eyes PRN Swayze, Ava, DO      . tamsulosin (FLOMAX) capsule 0.8 mg  0.8 mg Oral Daily Cox, Amy N, DO   0.8 mg at 05/19/20 1130  . thiamine (B-1) 250 mg in sodium chloride 0.9 % 50 mL IVPB  250 mg Intravenous Daily Donnetta Simpers, MD 100 mL/hr at 05/19/20 0916 250 mg at 05/19/20 0916   Followed by  . [START ON 05/23/2020] thiamine (B-1) injection 100 mg  100 mg Intravenous Daily Donnetta Simpers, MD      . vitamin B-12 (CYANOCOBALAMIN) tablet 1,000 mcg  1,000 mcg Oral Daily Donnetta Simpers, MD   1,000 mcg at 05/19/20 1132    Musculoskeletal: Strength & Muscle Tone: within normal limits Gait & Station: unsteady Patient leans: N/A  Psychiatric  Specialty Exam: Physical Exam Constitutional:      Appearance: He is well-developed and well-nourished.  HENT:     Head: Normocephalic and atraumatic.  Eyes:     Conjunctiva/sclera: Conjunctivae normal.     Pupils: Pupils are equal, round, and reactive to light.  Cardiovascular:     Heart sounds: Normal heart sounds.  Pulmonary:     Effort: Pulmonary effort is normal.  Abdominal:     Palpations: Abdomen is soft.  Musculoskeletal:        General: Normal range of motion.     Cervical back: Normal range of motion.  Skin:    General: Skin is warm and dry.  Neurological:     General: No focal deficit present.     Mental Status: He is alert.  Psychiatric:        Attention and Perception: He is inattentive.        Mood and Affect: Affect is blunt.        Speech: Speech is delayed.        Behavior: Behavior is slowed.        Cognition and Memory: Cognition is impaired. Memory is impaired.     Review of Systems  Constitutional: Negative.   HENT: Negative.   Eyes: Negative.   Respiratory: Negative.   Cardiovascular: Negative.   Gastrointestinal: Negative.   Musculoskeletal: Negative.   Skin: Negative.   Neurological: Negative.   Psychiatric/Behavioral: Negative.     Blood pressure (!) 155/79, pulse (!) 104, temperature 98 F (36.7 C), resp. rate 18, height 5\' 9"  (1.753 m), weight 80.7 kg, SpO2 94 %.Body mass index is 26.29 kg/m.  General Appearance: Casual  Eye Contact:  Minimal  Speech:  Slow  Volume:  Decreased  Mood:  Euthymic  Affect:  Constricted  Thought Process:  Disorganized  Orientation:  Negative  Thought Content:  Negative  Suicidal Thoughts:  No  Homicidal Thoughts:  No  Memory:  Negative  Judgement:  Negative  Insight:  Negative  Psychomotor Activity:  Negative  Concentration:  Concentration: Negative  Recall:  Negative  Fund of Knowledge:  Fair  Language:  Fair  Akathisia:  No  Handed:  Right  AIMS (if indicated):     Assets:  Resilience   ADL's:  Impaired  Cognition:  Impaired,  Mild  Sleep:        Treatment Plan Summary: Medication management and Plan Increasing dose of haloperidol at night to 10 mg for history of psychosis and possible confusion and sleep.  Patient will be reevaluated during the daytime.  Not clear to me yet what the discharge plan is.  If  he is remaining this confused might need placement.  Disposition: Supportive therapy provided about ongoing stressors. Discussed crisis plan, support from social network, calling 911, coming to the Emergency Department, and calling Suicide Hotline.  Alethia Berthold, MD 05/19/2020 6:13 PM

## 2020-05-19 NOTE — Progress Notes (Signed)
PROGRESS NOTE  Lance [name] DQQ:[id number] DOB: [birth year] DOA: 05/14/2020 PCP: Theotis Burrow, MD  Brief History   Lance [name] is a 74 y.o. male with medical history significant for hepatitis C was treated with Harvoni, insulin-dependent diabetes mellitus presented to the emergency department for chief concerns of being found unresponsive/altered mentation by his neighbors or friends.  No one is at bedside on my evaluation.  Patient does not want anyone to be called.  He does not know what happened.  He reports that he ate dinner today.  He denies anything out of the ordinary.  He denies drug use on day of admission.  He endorses compliance with medications.  ROS was negative for headache, vision changes, chest pain, shortness of breath, nausea, vomiting, dysphagia, fever, chills, dysuria, hematuria, diarrhea, weakness in legs, appetite changes, unintentional weight loss.   Social history: lives by himself. Endorses cocaine use and that the last time he used cocaine was about 1 week ago. He lasted drink etoh 1 week ago.   [facility] were consulted to admit the patient for further evaluation and treatment.   The patient tells me that he only remembers waking up on the ground. He states that he had no warning that he was going to pass out. No chest pain or shortness of breath prior to event. Feels fine now.  MRI brain demonstrates: Small or trace bilateral subdural hematomas, likely post traumatic,6 cm long axis right anterior frontal convexity Meningioma with extension to the midline, invasion and occlusion of the anterior-most superior sagittal sinusbutthe mid and posterior aspects of that sinus, as well as the other major dural venous structures appear to remain patent.. 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  Psychiatry has evaluated the patient and has found him to have subacute delirium, but not to require inpatient psychiatric placement at this time. No mental health treatment is required in their opinion other than substance abuse referral.  Neurology has signed off. They have recommended follow up with neurosurgery for his meningioma. Continue depakote. No further cocaine.   The patient has been evaluated by PT/OT. They have recommended SNF placement. TOC has been consulted for placement.  Consultants  . Neurology . Psychiatry  Procedures  . EEG  Antibiotics   Anti-infectives (From admission, onward)   Start     Dose/Rate Route Frequency Ordered Stop   05/15/20 1230  Ledipasvir-Sofosbuvir 90-400 MG TABS 1 tablet        1 tablet Oral Daily 05/15/20 1223     05/15/20 0900  vancomycin (VANCOREADY) IVPB 750 mg/150 mL  Status:  Discontinued        750 mg 150 mL/hr over 60 Minutes Intravenous Every 12 hours 05/14/20 2251 05/16/20 1809   05/15/20 0800  ceFEPIme (MAXIPIME) 2 g in sodium chloride 0.9 % 100 mL IVPB  Status:  Discontinued        2 g 200 mL/hr over 30 Minutes Intravenous Every 12 hours 05/14/20 2250 05/15/20 1321   05/14/20 2300  vancomycin (VANCOCIN) IVPB 1000 mg/200 mL premix        1,000 mg 200 mL/hr over 60 Minutes Intravenous  Once 05/14/20 2250 05/15/20 0156   05/14/20 2245  ceFEPIme (MAXIPIME) 2 g in sodium chloride 0.9 % 100 mL IVPB  Status:  Discontinued        2 g 200 mL/hr  over 30 Minutes Intravenous  Once 05/14/20 2244 05/14/20 2248   05/14/20 2245  vancomycin (VANCOCIN) IVPB 1000 mg/200 mL premix  Status:  Discontinued        1,000 mg 200 mL/hr over 60 Minutes Intravenous  Once 05/14/20 2244 05/14/20 2248   05/14/20 2245  metroNIDAZOLE (FLAGYL) tablet 500 mg  Status:  Discontinued        500 mg Oral Every 8 hours 05/14/20 2244 05/14/20 2248   05/14/20 1930  ceFEPIme (MAXIPIME) 2 g in sodium chloride 0.9 % 100 mL  IVPB        2 g 200 mL/hr over 30 Minutes Intravenous  Once 05/14/20 1923 05/14/20 2106   05/14/20 1930  metroNIDAZOLE (FLAGYL) IVPB 500 mg        500 mg 100 mL/hr over 60 Minutes Intravenous  Once 05/14/20 1923 05/14/20 2106   05/14/20 1930  vancomycin (VANCOCIN) IVPB 1000 mg/200 mL premix        1,000 mg 200 mL/hr over 60 Minutes Intravenous  Once 05/14/20 1923 05/14/20 2219      Subjective  During my visit the patient is lying patiently in his bed. He is cooperative, oriented and pleasant. No new complaints.  Objective   Vitals:  Vitals:   05/19/20 1222 05/19/20 1516  BP: 140/87 (!) 155/79  Pulse: 90 (!) 104  Resp: 19 18  Temp: 98.4 F (36.9 C) 98 F (36.7 C)  SpO2: 100% 94%   Exam:  Constitutional:  . The patient is awake, alert, and oriented x 3. No acute distress. Respiratory:  . No increased work of breathing. . No wheezes, rales, or rhonchi . No tactile fremitus Cardiovascular:  . Regular rate and rhythm . No murmurs, ectopy, or gallups. . No lateral PMI. No thrills. Abdomen:  . Abdomen is soft, non-tender, non-distended . No hernias, masses, or organomegaly . Normoactive bowel sounds.  Musculoskeletal:  . No cyanosis, clubbing, or edema Skin:  . No rashes, lesions, ulcers . palpation of skin: no induration or nodules Neurologic:  . CN 2-12 intact . Sensation all 4 extremities intact Psychiatric:  . Mental status o Mood, affect appropriate o Orientation to person, place, time  . judgment and insight appear intact  I have personally reviewed the following:   Today's Data  . Vitals, CMP  Micro Data  . Blood cultures x 2: No growth  Imaging  . CT head . MRI Brain  Cardiology Data  . EKG . Echocardiogram pending  Other Data  . EEG  Scheduled Meds: . buprenorphine-naloxone  1 tablet Sublingual Daily  . Chlorhexidine Gluconate Cloth  6 each Topical Daily  . citalopram  10 mg Oral Daily  . divalproex  500 mg Oral Q12H  . enoxaparin  (LOVENOX) injection  40 mg Subcutaneous Q24H  . ferrous sulfate  325 mg Oral Q breakfast  . insulin aspart  0-15 Units Subcutaneous TID WC  . insulin aspart  0-5 Units Subcutaneous QHS  . insulin detemir  10 Units Subcutaneous Q2200  . Ledipasvir-Sofosbuvir  1 tablet Oral Daily  . multivitamin with minerals  1 tablet Oral Daily  . OLANZapine  5 mg Oral QHS  . tamsulosin  0.8 mg Oral Daily  . [START ON 05/23/2020] thiamine injection  100 mg Intravenous Daily  . vitamin B-12  1,000 mcg Oral Daily   Continuous Infusions: . thiamine injection 250 mg (05/19/20 0916)    Principal Problem:   Subacute delirium Active Problems:   Sepsis ([place])   Syncope  LOS: 4 days   A & P  Altered level of consciousness: The patient has had swings of behavior between being cooperative and lucid to being confused, agitated aond combative. Concern for seizure activity. I have discussed the patient with Neurology. Seizure activity vs encephalopathy/delirium secondary to alcohol and cocaine abuse.   Sepsis: Doubt. Leukocytosis - due to demargination/reactive. Lactic acid - due to syncope/seizure/cocaine abuse. Resolved. No fever, hypotension, or likely source of infection. Procalcitonin negative. Antibiotics stopped.   Syncope/Seizure/AlteredMentalStatus/Post ictal period: Seizure would explain lactic acidosis, decreased level of consciousness, and leukocytosis. Possibly due to cocaine abuse and/or meningioma in the f. Echocardiogram ordered to evaluate for syncope. The patient is being monitored on telemetry. I have discussed the patient with Dr. Lorrin Goodell. He will consult and he has ordered an EEG. He recommends that Keppra be initiated if the patient has further episodes. Keppra has been changed to depakote. Dr. Sarita Haver feels that the patient's presentation was caused by meningioma with vasogenic edema placing pressure on the right frontal lobe. This in the setting of syncope likely due to cocaine abuse and  then SDH caused by fall.  Insulin-dependent diabetes mellitus-resumed home insulin. FSBS for the past 24 hours has run 168 - 210. HbA1c is 10.8.   HIV: Continue antiretroviral regimen.  Debility: PT/OT have recommended SNF. TOC consult has been placed.   I have seen and examined the patient myself. I have spent 30 minutes in his evaluation and care.  DVT prophylaxis: enoxaparin  Code Status: full code Diet: Carb modified/heart healthy Family Communication: he does not want anyone updated Disposition Plan: PT has recommended SNf placement. TOC has been consulted to assist. No safe discharge at this time.  Shanicka Oldenkamp, DO [facility] Direct contact: see [email or web address]  7PM-7AM contact night coverage as above 05/19/2020, 4:38 PM  LOS: 0 days

## 2020-05-19 NOTE — TOC Transition Note (Addendum)
Transition of Care [facility]) - CM/SW Discharge Note   Patient Details  Name: RAVEN HARMES MRN: [id number] Date of Birth: 09/06/1945  Transition of Care [facility]) CM/SW Contact:  Eileen Stanford, LCSW Phone Number: 05/19/2020, 10:48 AM   Clinical Narrative: Pt is only alert to self and place. CSW spoke with pt's daughter via telephone. Pt's daughter is agreeable for pt to go to SNF and states she works at [employer] and would like for pt to go there. CSW has sent referral will follow up regarding bed availability.    Pt's daughter called back and stated [facility] does not take pt's insurance. Pt's daughter is agreeable to [facility].   [facility] unable to take pt at d/c. Pt referral has been faxed out further--no bed offers at this time.    Barriers to Discharge: Continued Medical Work up   Patient Goals and CMS Choice Patient states their goals for this hospitalization and ongoing recovery are:: to get pt to [facility]   Choice offered to / list presented to : Adult Children  Discharge Placement                       Discharge Plan and Services In-house Referral: Clinical Social Work   Post Acute Care Choice: [facility]                               Social Determinants of Health (SDOH) Interventions     Readmission Risk Interventions No flowsheet data found.

## 2020-05-20 LAB — BASIC METABOLIC PANEL
Anion gap: 9 (ref 5–15)
BUN: 14 mg/dL (ref 8–23)
CO2: 28 mmol/L (ref 22–32)
Calcium: 9.1 mg/dL (ref 8.9–10.3)
Chloride: 102 mmol/L (ref 98–111)
Creatinine, Ser: 0.92 mg/dL (ref 0.61–1.24)
GFR, Estimated: >60 mL/min (ref >60)
Glucose, Bld: 159 mg/dL — ABNORMAL HIGH (ref 70–99)
Potassium: 4.2 mmol/L (ref 3.5–5.1)
Sodium: 139 mmol/L (ref 135–145)

## 2020-05-20 LAB — CBC WITH DIFFERENTIAL/PLATELET
Abs Immature Granulocytes: 0.05 10*3/uL (ref 0.00–0.07)
Basophils Absolute: 0 10*3/uL (ref 0.0–0.1)
Basophils Relative: 0 %
Eosinophils Absolute: 0.1 10*3/uL (ref 0.0–0.5)
Eosinophils Relative: 2 %
HCT: 36.5 % — ABNORMAL LOW (ref 39.0–52.0)
Hemoglobin: 12.4 g/dL — ABNORMAL LOW (ref 13.0–17.0)
Immature Granulocytes: 1 %
Lymphocytes Relative: 23 %
Lymphs Abs: 1.9 10*3/uL (ref 0.7–4.0)
MCH: 31 pg (ref 26.0–34.0)
MCHC: 34 g/dL (ref 30.0–36.0)
MCV: 91.3 fL (ref 80.0–100.0)
Monocytes Absolute: 0.8 10*3/uL (ref 0.1–1.0)
Monocytes Relative: 9 %
Neutro Abs: 5.3 10*3/uL (ref 1.7–7.7)
Neutrophils Relative %: 65 %
Platelets: 194 10*3/uL (ref 150–400)
RBC: 4 MIL/uL — ABNORMAL LOW (ref 4.22–5.81)
RDW: 12.6 % (ref 11.5–15.5)
WBC: 8.2 10*3/uL (ref 4.0–10.5)
nRBC: 0 % (ref 0.0–0.2)

## 2020-05-20 LAB — GLUCOSE, CAPILLARY
Glucose-Capillary: 146 mg/dL — ABNORMAL HIGH (ref 70–99)
Glucose-Capillary: 127 mg/dL — ABNORMAL HIGH (ref 70–99)
Glucose-Capillary: 96 mg/dL (ref 70–99)
Glucose-Capillary: 104 mg/dL — ABNORMAL HIGH (ref 70–99)
Glucose-Capillary: 105 mg/dL — ABNORMAL HIGH (ref 70–99)

## 2020-05-20 NOTE — Plan of Care (Signed)
  Problem: Fluid Volume: Goal: Hemodynamic stability will improve 05/20/2020 1402 by Cristela Blue, RN Outcome: Progressing 05/20/2020 1401 by Cristela Blue, RN Outcome: Progressing   Problem: Clinical Measurements: Goal: Diagnostic test results will improve 05/20/2020 1402 by Cristela Blue, RN Outcome: Progressing 05/20/2020 1401 by Cristela Blue, RN Outcome: Progressing Goal: Signs and symptoms of infection will decrease 05/20/2020 1402 by Cristela Blue, RN Outcome: Progressing 05/20/2020 1401 by Cristela Blue, RN Outcome: Progressing   Problem: Respiratory: Goal: Ability to maintain adequate ventilation will improve 05/20/2020 1402 by Cristela Blue, RN Outcome: Progressing 05/20/2020 1401 by Cristela Blue, RN Outcome: Progressing   Problem: Education: Goal: Knowledge of General Education information will improve Description: Including pain rating scale, medication(s)/side effects and non-pharmacologic comfort measures 05/20/2020 1402 by Cristela Blue, RN Outcome: Progressing 05/20/2020 1401 by Cristela Blue, RN Outcome: Progressing   Problem: Health Behavior/Discharge Planning: Goal: Ability to manage health-related needs will improve 05/20/2020 1402 by Cristela Blue, RN Outcome: Progressing 05/20/2020 1401 by Cristela Blue, RN Outcome: Progressing   Problem: Clinical Measurements: Goal: Ability to maintain clinical measurements within normal limits will improve 05/20/2020 1402 by Cristela Blue, RN Outcome: Progressing 05/20/2020 1401 by Cristela Blue, RN Outcome: Progressing Goal: Will remain free from infection 05/20/2020 1402 by Cristela Blue, RN Outcome: Progressing 05/20/2020 1401 by Cristela Blue, RN Outcome: Progressing Goal: Diagnostic test results will improve 05/20/2020 1402 by Cristela Blue, RN Outcome: Progressing 05/20/2020 1401 by Cristela Blue, RN Outcome: Progressing Goal: Respiratory complications will improve 05/20/2020  1402 by Cristela Blue, RN Outcome: Progressing 05/20/2020 1401 by Cristela Blue, RN Outcome: Progressing Goal: Cardiovascular complication will be avoided 05/20/2020 1402 by Cristela Blue, RN Outcome: Progressing 05/20/2020 1401 by Cristela Blue, RN Outcome: Progressing   Problem: Activity: Goal: Risk for activity intolerance will decrease 05/20/2020 1402 by Cristela Blue, RN Outcome: Progressing 05/20/2020 1401 by Cristela Blue, RN Outcome: Progressing   Problem: Nutrition: Goal: Adequate nutrition will be maintained 05/20/2020 1402 by Cristela Blue, RN Outcome: Progressing 05/20/2020 1401 by Cristela Blue, RN Outcome: Progressing   Problem: Coping: Goal: Level of anxiety will decrease 05/20/2020 1402 by Cristela Blue, RN Outcome: Progressing 05/20/2020 1401 by Cristela Blue, RN Outcome: Progressing   Problem: Elimination: Goal: Will not experience complications related to bowel motility 05/20/2020 1402 by Cristela Blue, RN Outcome: Progressing 05/20/2020 1401 by Cristela Blue, RN Outcome: Progressing Goal: Will not experience complications related to urinary retention 05/20/2020 1402 by Cristela Blue, RN Outcome: Progressing 05/20/2020 1401 by Cristela Blue, RN Outcome: Progressing   Problem: Pain Managment: Goal: General experience of comfort will improve 05/20/2020 1402 by Cristela Blue, RN Outcome: Progressing 05/20/2020 1401 by Cristela Blue, RN Outcome: Progressing   Problem: Safety: Goal: Ability to remain free from injury will improve 05/20/2020 1402 by Cristela Blue, RN Outcome: Progressing 05/20/2020 1401 by Cristela Blue, RN Outcome: Progressing   Problem: Skin Integrity: Goal: Risk for impaired skin integrity will decrease 05/20/2020 1402 by Cristela Blue, RN Outcome: Progressing 05/20/2020 1401 by Cristela Blue, RN Outcome: Progressing

## 2020-05-20 NOTE — Progress Notes (Signed)
Mobility Specialist - Progress Note   05/20/20 1326  Mobility  Activity Contraindicated/medical hold  Mobility performed by Mobility specialist    Per discussion w/ nurse, pt is extremely fatigue at this time. States pt has "been asleep all day" and can barely keep his eyes open. Will hold off and re-attempt at a later date/time when pt is appropriate.     Kynzi Levay Mobility Specialist  05/20/20, 1:28 PM

## 2020-05-20 NOTE — Progress Notes (Signed)
PROGRESS NOTE  Lance [name]:[id number] DOB: July 28, 1945 DOA: 05/14/2020 PCP: Theotis Burrow, MD  Brief History   Lance [name] is a 74 y.o. male with medical history significant for hepatitis C was treated with Harvoni, insulin-dependent diabetes mellitus presented to the emergency department for chief concerns of being found unresponsive/altered mentation by his neighbors or friends.  No one is at bedside on my evaluation.  Patient does not want anyone to be called.  He does not know what happened.  He reports that he ate dinner today.  He denies anything out of the ordinary.  He denies drug use on day of admission.  He endorses compliance with medications.  ROS was negative for headache, vision changes, chest pain, shortness of breath, nausea, vomiting, dysphagia, fever, chills, dysuria, hematuria, diarrhea, weakness in legs, appetite changes, unintentional weight loss.   Social history: lives by himself. Endorses cocaine use and that the last time he used cocaine was about 1 week ago. He lasted drink etoh 1 week ago.   [facility] were consulted to admit the patient for further evaluation and treatment.   The patient tells me that he only remembers waking up on the ground. He states that he had no warning that he was going to pass out. No chest pain or shortness of breath prior to event. Feels fine now.  MRI brain demonstrates: Small or trace bilateral subdural hematomas, likely post traumatic,6 cm long axis right anterior frontal convexity Meningioma with extension to the midline, invasion and occlusion of the anterior-most superior sagittal sinusbutthe mid and posterior aspects of that sinus, as well as the other major dural venous structures appear to remain patent.. 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  Psychiatry has evaluated the patient and has found him to have subacute delirium, but not to require inpatient psychiatric placement at this time. No mental health treatment is required in their opinion other than substance abuse referral.  Neurology has signed off. They have recommended follow up with neurosurgery for his meningioma. Continue depakote. No further cocaine.   The patient has been evaluated by PT/OT. They have recommended SNF placement. TOC has been consulted for placement.  Consultants  . Neurology . Psychiatry  Procedures  . EEG  Antibiotics   Anti-infectives (From admission, onward)   Start     Dose/Rate Route Frequency Ordered Stop   05/15/20 1230  Ledipasvir-Sofosbuvir 90-400 MG TABS 1 tablet        1 tablet Oral Daily 05/15/20 1223     05/15/20 0900  vancomycin (VANCOREADY) IVPB 750 mg/150 mL  Status:  Discontinued        750 mg 150 mL/hr over 60 Minutes Intravenous Every 12 hours 05/14/20 2251 05/16/20 1809   05/15/20 0800  ceFEPIme (MAXIPIME) 2 g in sodium chloride 0.9 % 100 mL IVPB  Status:  Discontinued        2 g 200 mL/hr over 30 Minutes Intravenous Every 12 hours 05/14/20 2250 05/15/20 1321   05/14/20 2300  vancomycin (VANCOCIN) IVPB 1000 mg/200 mL premix        1,000 mg 200 mL/hr over 60 Minutes Intravenous  Once 05/14/20 2250 05/15/20 0156   05/14/20 2245  ceFEPIme (MAXIPIME) 2 g in sodium chloride 0.9 % 100 mL IVPB  Status:  Discontinued        2 g 200 mL/hr  over 30 Minutes Intravenous  Once 05/14/20 2244 05/14/20 2248   05/14/20 2245  vancomycin (VANCOCIN) IVPB 1000 mg/200 mL premix  Status:  Discontinued        1,000 mg 200 mL/hr over 60 Minutes Intravenous  Once 05/14/20 2244 05/14/20 2248   05/14/20 2245  metroNIDAZOLE (FLAGYL) tablet 500 mg  Status:  Discontinued        500 mg Oral Every 8 hours 05/14/20 2244 05/14/20 2248   05/14/20 1930  ceFEPIme (MAXIPIME) 2 g in sodium chloride 0.9 % 100 mL  IVPB        2 g 200 mL/hr over 30 Minutes Intravenous  Once 05/14/20 1923 05/14/20 2106   05/14/20 1930  metroNIDAZOLE (FLAGYL) IVPB 500 mg        500 mg 100 mL/hr over 60 Minutes Intravenous  Once 05/14/20 1923 05/14/20 2106   05/14/20 1930  vancomycin (VANCOCIN) IVPB 1000 mg/200 mL premix        1,000 mg 200 mL/hr over 60 Minutes Intravenous  Once 05/14/20 1923 05/14/20 2219      Subjective  The patient is lying quietly in bed. He has been fairly lethargic today per nursing.  Objective   Vitals:  Vitals:   05/20/20 0821 05/20/20 1203  BP: (!) 149/85 (!) 137/92  Pulse: 79 82  Resp: 16 16  Temp: 98.3 F (36.8 C) 98.8 F (37.1 C)  SpO2: 99% 97%   Exam:  Constitutional:  . The patient is awake, but lethargic. No acute distress. Respiratory:  . No increased work of breathing. . No wheezes, rales, or rhonchi . No tactile fremitus Cardiovascular:  . Regular rate and rhythm . No murmurs, ectopy, or gallups. . No lateral PMI. No thrills. Abdomen:  . Abdomen is soft, non-tender, non-distended . No hernias, masses, or organomegaly . Normoactive bowel sounds.  Musculoskeletal:  . No cyanosis, clubbing, or edema Skin:  . No rashes, lesions, ulcers . palpation of skin: no induration or nodules Neurologic:  . CN 2-12 intact . Sensation all 4 extremities intact Psychiatric:  . Mental status o Mood, affect appropriate o Orientation to person, place, time  . judgment and insight appear intact  I have personally reviewed the following:   Today's Data  . Vitals, BMP, CBC  Micro Data  . Blood cultures x 2: No growth  Imaging  . CT head . MRI Brain  Cardiology Data  . EKG . Echocardiogram pending  Other Data  . EEG  Scheduled Meds: . buprenorphine-naloxone  1 tablet Sublingual Daily  . Chlorhexidine Gluconate Cloth  6 each Topical Daily  . citalopram  10 mg Oral Daily  . divalproex  500 mg Oral Q12H  . enoxaparin (LOVENOX) injection  40 mg Subcutaneous  Q24H  . ferrous sulfate  325 mg Oral Q breakfast  . insulin aspart  0-15 Units Subcutaneous TID WC  . insulin aspart  0-5 Units Subcutaneous QHS  . insulin detemir  10 Units Subcutaneous Q2200  . Ledipasvir-Sofosbuvir  1 tablet Oral Daily  . multivitamin with minerals  1 tablet Oral Daily  . OLANZapine  10 mg Oral QHS  . tamsulosin  0.8 mg Oral Daily  . [START ON 05/23/2020] thiamine injection  100 mg Intravenous Daily  . vitamin B-12  1,000 mcg Oral Daily   Continuous Infusions: . thiamine injection 250 mg (05/20/20 0925)    Principal Problem:   Subacute delirium Active Problems:   Sepsis ([place])   Syncope   LOS: 5 days  A & P  Altered level of consciousness: The patient has had swings of behavior between being cooperative and lucid to being confused, agitated aond combative. Concern for seizure activity. I have discussed the patient with Neurology. Seizure activity vs encephalopathy/delirium secondary to alcohol and cocaine abuse. Somewhat lethargic today  Sepsis: Doubt. Leukocytosis - due to demargination/reactive. Lactic acid - due to syncope/seizure/cocaine abuse. Resolved. No fever, hypotension, or likely source of infection. Procalcitonin negative. Antibiotics stopped.   Syncope/Seizure/AlteredMentalStatus/Post ictal period: Seizure would explain lactic acidosis, decreased level of consciousness, and leukocytosis. Possibly due to cocaine abuse and/or meningioma in the f. Echocardiogram ordered to evaluate for syncope. The patient is being monitored on telemetry. I have discussed the patient with Dr. Lorrin Goodell. He will consult and he has ordered an EEG. He recommends that Keppra be initiated if the patient has further episodes. Keppra has been changed to depakote. Dr. Sarita Haver feels that the patient's presentation was caused by meningioma with vasogenic edema placing pressure on the right frontal lobe. This in the setting of syncope likely due to cocaine abuse and then SDH caused  by fall.  Insulin-dependent diabetes mellitus-resumed home insulin. FSBS for the past 24 hours has run 168 - 210. HbA1c is 10.8.   HIV: Continue antiretroviral regimen.  Debility: PT/OT have recommended SNF. TOC consult has been placed.   I have seen and examined the patient myself. I have spent 30 minutes in his evaluation and care.  DVT prophylaxis: enoxaparin  Code Status: full code Diet: Carb modified/heart healthy Family Communication: he does not want anyone updated Disposition Plan: PT has recommended SNf placement. TOC has been consulted to assist. No safe discharge at this time.  Lance Devan, DO [facility] Direct contact: see [email or web address]  7PM-7AM contact night coverage as above 05/20/2020, 3:05 PM  LOS: 0 days

## 2020-05-20 NOTE — Plan of Care (Signed)
  Problem: Fluid Volume: Goal: Hemodynamic stability will improve Outcome: Progressing   Problem: Clinical Measurements: Goal: Will remain free from infection Outcome: Progressing Goal: Respiratory complications will improve Outcome: Progressing Goal: Cardiovascular complication will be avoided Outcome: Progressing   Problem: Activity: Goal: Risk for activity intolerance will decrease Outcome: Progressing

## 2020-05-21 LAB — GLUCOSE, CAPILLARY
Glucose-Capillary: 109 mg/dL — ABNORMAL HIGH (ref 70–99)
Glucose-Capillary: 96 mg/dL (ref 70–99)
Glucose-Capillary: 79 mg/dL (ref 70–99)
Glucose-Capillary: 112 mg/dL — ABNORMAL HIGH (ref 70–99)
Glucose-Capillary: 92 mg/dL (ref 70–99)
Glucose-Capillary: 109 mg/dL — ABNORMAL HIGH (ref 70–99)

## 2020-05-21 LAB — BASIC METABOLIC PANEL
Anion gap: 9 (ref 5–15)
BUN: 16 mg/dL (ref 8–23)
CO2: 29 mmol/L (ref 22–32)
Calcium: 9.2 mg/dL (ref 8.9–10.3)
Chloride: 101 mmol/L (ref 98–111)
Creatinine, Ser: 0.97 mg/dL (ref 0.61–1.24)
GFR, Estimated: >60 mL/min (ref >60)
Glucose, Bld: 109 mg/dL — ABNORMAL HIGH (ref 70–99)
Potassium: 4.3 mmol/L (ref 3.5–5.1)
Sodium: 139 mmol/L (ref 135–145)

## 2020-05-21 MED ORDER — DIVALPROEX SODIUM 250 MG PO DR TAB
375.0000 mg | DELAYED_RELEASE_TABLET | Freq: Two times a day (BID) | ORAL | Status: DC
  Administered 2020-05-21: 22:00:00 375 mg via ORAL
  Filled 2020-05-21 (×3): qty 1

## 2020-05-21 NOTE — Progress Notes (Signed)
PROGRESS NOTE  Lance [name] PIR:[id number] DOB: November 26, 1945 DOA: 05/14/2020 PCP: Theotis Burrow, MD  Brief History   Lance [name] is a 74 y.o. male with medical history significant for hepatitis C was treated with Harvoni, insulin-dependent diabetes mellitus presented to the emergency department for chief concerns of being found unresponsive/altered mentation by his neighbors or friends.  No one is at bedside on my evaluation.  Patient does not want anyone to be called.  He does not know what happened.  He reports that he ate dinner today.  He denies anything out of the ordinary.  He denies drug use on day of admission.  He endorses compliance with medications.  ROS was negative for headache, vision changes, chest pain, shortness of breath, nausea, vomiting, dysphagia, fever, chills, dysuria, hematuria, diarrhea, weakness in legs, appetite changes, unintentional weight loss.   Social history: lives by himself. Endorses cocaine use and that the last time he used cocaine was about 1 week ago. He lasted drink etoh 1 week ago.   [facility] were consulted to admit the patient for further evaluation and treatment.   The patient tells me that he only remembers waking up on the ground. He states that he had no warning that he was going to pass out. No chest pain or shortness of breath prior to event. Feels fine now.  MRI brain demonstrates: Small or trace bilateral subdural hematomas, likely post traumatic,6 cm long axis right anterior frontal convexity Meningioma with extension to the midline, invasion and occlusion of the anterior-most superior sagittal sinusbutthe mid and posterior aspects of that sinus, as well as the other major dural venous structures appear to remain patent.. 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  Psychiatry has evaluated the patient and has found him to have subacute delirium, but not to require inpatient psychiatric placement at this time. No mental health treatment is required in their opinion other than substance abuse referral.  Neurology has signed off. They have recommended follow up with neurosurgery for his meningioma. Continue depakote. No further cocaine.   The patient has been evaluated by PT/OT. They have recommended SNF placement. TOC has been consulted for placement.  Consultants  . Neurology . Psychiatry  Procedures  . EEG  Antibiotics   Anti-infectives (From admission, onward)   Start     Dose/Rate Route Frequency Ordered Stop   05/15/20 1230  Ledipasvir-Sofosbuvir 90-400 MG TABS 1 tablet        1 tablet Oral Daily 05/15/20 1223     05/15/20 0900  vancomycin (VANCOREADY) IVPB 750 mg/150 mL  Status:  Discontinued        750 mg 150 mL/hr over 60 Minutes Intravenous Every 12 hours 05/14/20 2251 05/16/20 1809   05/15/20 0800  ceFEPIme (MAXIPIME) 2 g in sodium chloride 0.9 % 100 mL IVPB  Status:  Discontinued        2 g 200 mL/hr over 30 Minutes Intravenous Every 12 hours 05/14/20 2250 05/15/20 1321   05/14/20 2300  vancomycin (VANCOCIN) IVPB 1000 mg/200 mL premix        1,000 mg 200 mL/hr over 60 Minutes Intravenous  Once 05/14/20 2250 05/15/20 0156   05/14/20 2245  ceFEPIme (MAXIPIME) 2 g in sodium chloride 0.9 % 100 mL IVPB  Status:  Discontinued        2 g 200 mL/hr  over 30 Minutes Intravenous  Once 05/14/20 2244 05/14/20 2248   05/14/20 2245  vancomycin (VANCOCIN) IVPB 1000 mg/200 mL premix  Status:  Discontinued        1,000 mg 200 mL/hr over 60 Minutes Intravenous  Once 05/14/20 2244 05/14/20 2248   05/14/20 2245  metroNIDAZOLE (FLAGYL) tablet 500 mg  Status:  Discontinued        500 mg Oral Every 8 hours 05/14/20 2244 05/14/20 2248   05/14/20 1930  ceFEPIme (MAXIPIME) 2 g in sodium chloride 0.9 % 100 mL  IVPB        2 g 200 mL/hr over 30 Minutes Intravenous  Once 05/14/20 1923 05/14/20 2106   05/14/20 1930  metroNIDAZOLE (FLAGYL) IVPB 500 mg        500 mg 100 mL/hr over 60 Minutes Intravenous  Once 05/14/20 1923 05/14/20 2106   05/14/20 1930  vancomycin (VANCOCIN) IVPB 1000 mg/200 mL premix        1,000 mg 200 mL/hr over 60 Minutes Intravenous  Once 05/14/20 1923 05/14/20 2219      Subjective  The patient is fairly lethargic for the second day. No new complaints.  Objective   Vitals:  Vitals:   05/21/20 0747 05/21/20 1132  BP: 130/81 127/79  Pulse: 87 84  Resp: 16   Temp: 98.4 F (36.9 C) 98.5 F (36.9 C)  SpO2: 100% 100%   Exam:  Constitutional:  . The patient is awake, but lethargic. No acute distress. Respiratory:  . No increased work of breathing. . No wheezes, rales, or rhonchi . No tactile fremitus Cardiovascular:  . Regular rate and rhythm . No murmurs, ectopy, or gallups. . No lateral PMI. No thrills. Abdomen:  . Abdomen is soft, non-tender, non-distended . No hernias, masses, or organomegaly . Normoactive bowel sounds.  Musculoskeletal:  . No cyanosis, clubbing, or edema Skin:  . No rashes, lesions, ulcers . palpation of skin: no induration or nodules Neurologic:  . CN 2-12 intact . Sensation all 4 extremities intact Psychiatric:  . Mental status o Mood, affect appropriate o Orientation to person, place, time  . judgment and insight appear intact  I have personally reviewed the following:   Today's Data  . Vitals, BMP, CBC  Micro Data  . Blood cultures x 2: No growth  Imaging  . CT head . MRI Brain  Cardiology Data  . EKG . Echocardiogram pending  Other Data  . EEG  Scheduled Meds: . buprenorphine-naloxone  1 tablet Sublingual Daily  . Chlorhexidine Gluconate Cloth  6 each Topical Daily  . citalopram  10 mg Oral Daily  . divalproex  375 mg Oral Q12H  . enoxaparin (LOVENOX) injection  40 mg Subcutaneous Q24H  . ferrous sulfate   325 mg Oral Q breakfast  . insulin aspart  0-15 Units Subcutaneous TID WC  . insulin aspart  0-5 Units Subcutaneous QHS  . insulin detemir  10 Units Subcutaneous Q2200  . Ledipasvir-Sofosbuvir  1 tablet Oral Daily  . multivitamin with minerals  1 tablet Oral Daily  . OLANZapine  10 mg Oral QHS  . tamsulosin  0.8 mg Oral Daily  . [START ON 05/23/2020] thiamine injection  100 mg Intravenous Daily  . vitamin B-12  1,000 mcg Oral Daily   Continuous Infusions: . thiamine injection Stopped (05/21/20 0943)    Principal Problem:   Subacute delirium Active Problems:   Sepsis ([place])   Syncope   LOS: 6 days   A & P  Altered level  of consciousness: The patient has had swings of behavior between being cooperative and lucid to being confused, agitated aond combative. Concern for seizure activity. I have discussed the patient with Neurology. Seizure activity vs encephalopathy/delirium secondary to alcohol and cocaine abuse. Somewhat lethargic for the last 2 days. Will decrease dose of Depakote from 500 mg bid to 375 PO bid. Monitor.  Sepsis: Doubt. Leukocytosis - due to demargination/reactive. Lactic acid - due to syncope/seizure/cocaine abuse. Resolved. No fever, hypotension, or likely source of infection. Procalcitonin negative. Antibiotics stopped.   Syncope/Seizure/AlteredMentalStatus/Post ictal period: Seizure would explain lactic acidosis, decreased level of consciousness, and leukocytosis. Possibly due to cocaine abuse and/or meningioma in the f. Echocardiogram ordered to evaluate for syncope. The patient is being monitored on telemetry. I have discussed the patient with Dr. Lorrin Goodell. He will consult and he has ordered an EEG. He recommends that Keppra be initiated if the patient has further episodes. Keppra has been changed to depakote. Dr. Sarita Haver feels that the patient's presentation was caused by meningioma with vasogenic edema placing pressure on the right frontal lobe. This in the  setting of syncope likely due to cocaine abuse and then SDH caused by fall.  Insulin-dependent diabetes mellitus-resumed home insulin. FSBS for the past 24 hours has run 168 - 210. HbA1c is 10.8.   HIV: Continue antiretroviral regimen.  Debility: PT/OT have recommended SNF. TOC consult has been placed.   I have seen and examined the patient myself. I have spent 35 minutes in his evaluation and care.  DVT prophylaxis: enoxaparin  Code Status: full code Diet: Carb modified/heart healthy Family Communication: he does not want anyone updated Disposition Plan: PT has recommended SNf placement. TOC has been consulted to assist. No safe discharge at this time.  Katya Rolston, DO [facility] Direct contact: see [email or web address]  7PM-7AM contact night coverage as above 05/21/2020, 3:23 PM  LOS: 0 days

## 2020-05-21 NOTE — Progress Notes (Signed)
PT Cancellation Note  Patient Details Name: Lance [name] MRN: [id number] DOB: [birth year]   Cancelled Treatment:    Reason Eval/Treat Not Completed: Other (RN, Crystal, deferring therapy at this time as pt continues to have increased lethargy. Will follow up with therapy intervention tomorrow, as appropriate.)  Herminio Commons, PT, DPT 2:42 PM,05/21/20

## 2020-05-22 ENCOUNTER — Inpatient Hospital Stay: Payer: Medicare HMO

## 2020-05-22 LAB — GLUCOSE, CAPILLARY
Glucose-Capillary: 136 mg/dL — ABNORMAL HIGH (ref 70–99)
Glucose-Capillary: 103 mg/dL — ABNORMAL HIGH (ref 70–99)
Glucose-Capillary: 114 mg/dL — ABNORMAL HIGH (ref 70–99)
Glucose-Capillary: 123 mg/dL — ABNORMAL HIGH (ref 70–99)
Glucose-Capillary: 146 mg/dL — ABNORMAL HIGH (ref 70–99)
Glucose-Capillary: 177 mg/dL — ABNORMAL HIGH (ref 70–99)

## 2020-05-22 LAB — BLOOD GAS, ARTERIAL
Acid-Base Excess: 7.6 mmol/L — ABNORMAL HIGH (ref 0.0–2.0)
Allens test (pass/fail): POSITIVE — AB
Bicarbonate: 32 mmol/L — ABNORMAL HIGH (ref 20.0–28.0)
FIO2: 0.21
O2 Saturation: 95.9 %
Patient temperature: 37
pCO2 arterial: 43 mmHg (ref 32.0–48.0)
pH, Arterial: 7.48 — ABNORMAL HIGH (ref 7.350–7.450)
pO2, Arterial: 75 mmHg — ABNORMAL LOW (ref 83.0–108.0)

## 2020-05-22 NOTE — Progress Notes (Signed)
Rapid response called for apneic episodes, Dr.Swayze at bedside, orders for STAT AGBs, CT of the head and bipap ventilation. Will continue to monitor.

## 2020-05-22 NOTE — Progress Notes (Signed)
Went to check on patient after rapid response this morning. Patient appeared in much the same condition. Longest apnea moment observed was 25 seconds this time. Patient was more responsive when verbally stimulated and was able to speak a few sentences with this RN. Touched base with patient's RN who reported that apnea events basically the same as earlier.

## 2020-05-22 NOTE — Progress Notes (Signed)
OT Cancellation Note  Patient Details Name: Lance [name] MRN: [id number] DOB: 1945/11/14   Cancelled Treatment:    Reason Eval/Treat Not Completed: Patient's level of consciousness;Medical issues which prohibited therapy. Chart reviewed. Rapid response called earlier today with apneic episodes. Checked with RN and pt inappropriate for therapy this afternoon. Will re-attempt at later date/time as medically appropriate.   Jeni Salles, MPH, MS, OTR/L ascom [phone] 05/22/20, 4:15 PM

## 2020-05-22 NOTE — Significant Event (Signed)
Rapid Response Event Note   Reason for Call :  Apnea periods, worsening mental status  Initial Focused Assessment:  Rapid response RN arrived in patient's room with patient surrounded by 2A RNs. Patient with known subdural hematomas and meningioma. Per 2A staff yesterday was alert and able to answer questions, this morning was alert but not really following commands, about 30 minutes prior to rapid being called patient having prolonged periods of apnea (about 30 seconds at a time). Joanne Chars RT and Fritz Pickerel RT arrived at bedside during assessment. Vital signs stable (last check 10:57 : BP 124/78, MAP 91, HR 95 SR, O2 sats 99% on room air, RR 15 irregular due to apnea periods). CBG 114. Longest apnea period observed 32 seconds. Patient's eyes open the whole time and O2 saturations never went below mid 90s during any apnea event. Patient would start spontaneously breathing on his own Patient able to move all extremities and able to answer some questions when asked but not full sentences and then would have his eyes open but stare off into space and continue to have his apnea events.  Interventions:  Dr. Benny Lennert ordered an ABG and a head CT. Freda RT got ABG and this RN took patient to CT. Based on ABG results, Dr. Benny Lennert decided not to order BIPAP.   Plan of Care:  Patient to remain on 2A for now. Based on CT results, Dr. Benny Lennert stated she may reconsult neurology based on CT results and patient's condition.   Event Summary:   MD Notified: Dr. Benny Lennert Call Time: 11:03 Arrival Time: 11:04 End Time: 11:58  Rhianne Soman, Jaynie Bream, RN

## 2020-05-22 NOTE — Progress Notes (Signed)
Pt very lethargic, responds to voice, starring gaze, pupils unreactive, pocketing food, unable to give meds due to pt not following commands. VSS. Apneic episodes. Dr.Swayze updated via chat on pt's condition. No new orders at this time, Will continue to monitor.

## 2020-05-22 NOTE — Progress Notes (Addendum)
PROGRESS NOTE  Lance [name] PTW:[id number] DOB: 03-17-1946 DOA: 05/14/2020 PCP: Theotis Burrow, MD  Brief History   Lance [name] is a 74 y.o. male with medical history significant for hepatitis C was treated with Harvoni, insulin-dependent diabetes mellitus presented to the emergency department for chief concerns of being found unresponsive/altered mentation by his neighbors or friends.  No one is at bedside on my evaluation.  Patient does not want anyone to be called.  He does not know what happened.  He reports that he ate dinner today.  He denies anything out of the ordinary.  He denies drug use on day of admission.  He endorses compliance with medications.  ROS was negative for headache, vision changes, chest pain, shortness of breath, nausea, vomiting, dysphagia, fever, chills, dysuria, hematuria, diarrhea, weakness in legs, appetite changes, unintentional weight loss.   Social history: lives by himself. Endorses cocaine use and that the last time he used cocaine was about 1 week ago. He lasted drink etoh 1 week ago.   [facility] were consulted to admit the patient for further evaluation and treatment.   The patient tells me that he only remembers waking up on the ground. He states that he had no warning that he was going to pass out. No chest pain or shortness of breath prior to event. Feels fine now.  MRI brain demonstrates: Small or trace bilateral subdural hematomas, likely post traumatic,6 cm long axis right anterior frontal convexity Meningioma with extension to the midline, invasion and occlusion of the anterior-most superior sagittal sinusbutthe mid and posterior aspects of that sinus, as well as the other major dural venous structures appear to remain patent.. 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  Psychiatry has evaluated the patient and has found him to have subacute delirium, but not to require inpatient psychiatric placement at this time. No mental health treatment is required in their opinion other than substance abuse referral.  Neurology has signed off. They have recommended follow up with neurosurgery for his meningioma. Continue depakote. No further cocaine.   The patient has been evaluated by PT/OT. They have recommended SNF placement. TOC has been consulted for placement.  Pt very lethargic on 05/21/2020. Depakote dose reduced and patient less lethargic upon visit this morning. The patient spoke to me.   Consultants  . Neurology . Psychiatry  Procedures  . EEG  Antibiotics   Anti-infectives (From admission, onward)   Start     Dose/Rate Route Frequency Ordered Stop   05/15/20 1230  Ledipasvir-Sofosbuvir 90-400 MG TABS 1 tablet        1 tablet Oral Daily 05/15/20 1223     05/15/20 0900  vancomycin (VANCOREADY) IVPB 750 mg/150 mL  Status:  Discontinued        750 mg 150 mL/hr over 60 Minutes Intravenous Every 12 hours 05/14/20 2251 05/16/20 1809   05/15/20 0800  ceFEPIme (MAXIPIME) 2 g in sodium chloride 0.9 % 100 mL IVPB  Status:  Discontinued        2 g 200 mL/hr over 30 Minutes Intravenous Every 12 hours 05/14/20 2250 05/15/20 1321   05/14/20 2300  vancomycin (VANCOCIN) IVPB 1000 mg/200 mL premix        1,000 mg 200 mL/hr over 60 Minutes Intravenous  Once 05/14/20 2250 05/15/20 0156   05/14/20 2245  ceFEPIme (MAXIPIME) 2 g  in sodium chloride 0.9 % 100 mL IVPB  Status:  Discontinued        2 g 200 mL/hr over 30 Minutes Intravenous  Once 05/14/20 2244 05/14/20 2248   05/14/20 2245  vancomycin (VANCOCIN) IVPB 1000 mg/200 mL premix  Status:  Discontinued        1,000 mg 200 mL/hr over 60 Minutes Intravenous  Once 05/14/20 2244 05/14/20 2248   05/14/20 2245  metroNIDAZOLE (FLAGYL) tablet 500 mg  Status:  Discontinued         500 mg Oral Every 8 hours 05/14/20 2244 05/14/20 2248   05/14/20 1930  ceFEPIme (MAXIPIME) 2 g in sodium chloride 0.9 % 100 mL IVPB        2 g 200 mL/hr over 30 Minutes Intravenous  Once 05/14/20 1923 05/14/20 2106   05/14/20 1930  metroNIDAZOLE (FLAGYL) IVPB 500 mg        500 mg 100 mL/hr over 60 Minutes Intravenous  Once 05/14/20 1923 05/14/20 2106   05/14/20 1930  vancomycin (VANCOCIN) IVPB 1000 mg/200 mL premix        1,000 mg 200 mL/hr over 60 Minutes Intravenous  Once 05/14/20 1923 05/14/20 2219      Subjective  The patient is fairly lethargic for the second day. No new complaints.  Objective   Vitals:  Vitals:   05/22/20 1057 05/22/20 1202  BP: 124/78 (!) 149/81  Pulse: 95 97  Resp: 15 16  Temp: 98.6 F (37 C) 97.7 F (36.5 C)  SpO2: 99% 100%   Exam:  Constitutional:  . The patient is lethargic thsi am, but less so than yesterday. Answers questions appropriately. No acute distress. Respiratory:  . No increased work of breathing. . No wheezes, rales, or rhonchi . No tactile fremitus Cardiovascular:  . Regular rate and rhythm . No murmurs, ectopy, or gallups. . No lateral PMI. No thrills. Abdomen:  . Abdomen is soft, non-tender, non-distended . No hernias, masses, or organomegaly . Normoactive bowel sounds.  Musculoskeletal:  . No cyanosis, clubbing, or edema Skin:  . No rashes, lesions, ulcers . palpation of skin: no induration or nodules Neurologic:  . CN 2-12 intact . Sensation all 4 extremities intact Psychiatric:  . Mental status o Mood, affect appropriate o Orientation to person, place, time  . judgment and insight appear intact  I have personally reviewed the following:   Today's Data  . Vitals, BMP  Micro Data  . Blood cultures x 2: No growth  Imaging  . CT head . MRI Brain  Cardiology Data  . EKG . Echocardiogram pending  Other Data  . EEG  Scheduled Meds: . buprenorphine-naloxone  1 tablet Sublingual Daily  .  Chlorhexidine Gluconate Cloth  6 each Topical Daily  . citalopram  10 mg Oral Daily  . enoxaparin (LOVENOX) injection  40 mg Subcutaneous Q24H  . ferrous sulfate  325 mg Oral Q breakfast  . insulin aspart  0-15 Units Subcutaneous TID WC  . insulin aspart  0-5 Units Subcutaneous QHS  . insulin detemir  10 Units Subcutaneous Q2200  . Ledipasvir-Sofosbuvir  1 tablet Oral Daily  . multivitamin with minerals  1 tablet Oral Daily  . tamsulosin  0.8 mg Oral Daily  . [START ON 05/23/2020] thiamine injection  100 mg Intravenous Daily  . vitamin B-12  1,000 mcg Oral Daily   Continuous Infusions:   Principal Problem:   Subacute delirium Active Problems:   Sepsis ([place])   Syncope   LOS: 7 days  A & P  Altered level of consciousness: The patient has had swings of behavior between being cooperative and lucid to being confused, agitated aond combative. Concern for seizure activity. I have discussed the patient with Neurology. Seizure activity vs encephalopathy/delirium secondary to alcohol and cocaine abuse. Somewhat lethargic for the last 2 days. Dose of Depakote decreased from 500 mg bid to 375 PO bid. Pt more reponsive this morning.  Sepsis: Doubt. Leukocytosis - due to demargination/reactive. Lactic acid - due to syncope/seizure/cocaine abuse. Resolved. No fever, hypotension, or likely source of infection. Procalcitonin negative. Antibiotics stopped.   Syncope/Seizure/AlteredMentalStatus/Post ictal period: Seizure would explain lactic acidosis, decreased level of consciousness, and leukocytosis. Possibly due to cocaine abuse and/or meningioma in the f. Echocardiogram ordered to evaluate for syncope. The patient is being monitored on telemetry. I have discussed the patient with Dr. Lorrin Goodell. He will consult and he has ordered an EEG. He recommends that Keppra be initiated if the patient has further episodes. Keppra has been changed to depakote. Dr. Sarita Haver feels that the patient's presentation  was caused by meningioma with vasogenic edema placing pressure on the right frontal lobe. This in the setting of syncope likely due to cocaine abuse and then SDH caused by fall.  Insulin-dependent diabetes mellitus-resumed home insulin. FSBS for the past 24 hours has run 168 - 210. HbA1c is 10.8.   HIV: Continue antiretroviral regimen.  Debility: PT/OT have recommended SNF. TOC consult has been placed.   I have seen and examined the patient myself. I have spent 32 minutes in his evaluation and care.  DVT prophylaxis: enoxaparin  Code Status: full code Diet: Carb modified/heart healthy Family Communication: he does not want anyone updated Disposition Plan: PT has recommended SNf placement. TOC has been consulted to assist. No safe discharge at this time.  Chrisette Man, DO [facility] Direct contact: see [email or web address]  7PM-7AM contact night coverage as above 05/22/2020, 9:47 AM  LOS: 0 days   ADDENDUM:  Rapid response called on this patient around 11:15. The patient was moribund. He was responsive to sternal rub and would speak, but mostly just stared with apneic episodes in respirations. The patient's vital signs and glucose were within normal limits including SaO2. ABG was obtained due to prolonged apneic periods. The pH was slightly alkalotic, but PCO2 was 44 and pAO2 was 73. All sedating medications were held.  A couple of hours later the patient was apparently fully awake, agitated, and swinging at nursing. HR at that time was 120-150. EKG ordered and oral depakote given. Will use injectable antipsychotics if necessary. Monitor.

## 2020-05-22 NOTE — Consult Note (Signed)
[facility] Consult   Reason for Consult: Follow-up for this 74 year old man with multiple medical problems. Referring Physician: Swayze Patient Identification: Lance [name] MRN:  [id number] Principal Diagnosis: Subacute delirium Diagnosis:  Principal Problem:   Subacute delirium Active Problems:   Sepsis ([place])   Syncope   Total Time spent with patient: 20 minutes  Subjective:   Lance [name] is a 74 y.o. male patient admitted with patient nonverbal.  HPI: Came by to see patient today also reviewed chart.  Evidently he had an episode this morning of hypoxia and decreased responsiveness.  When I came to see him this afternoon he was in bed flat on his back eyes open not responding.  After a few minutes of speaking to him he started to vocalize slightly but it was not clear that he was actually addressing me and nothing that he said was comprehensible.  More sort of wheezing.  Past Psychiatric History: See previous  Risk to Self:   Risk to Others:   Prior Inpatient Therapy:   Prior Outpatient Therapy:    Past Medical History:  Past Medical History:  Diagnosis Date  . Diabetes mellitus without complication ([place])   . Hepatitis C   . Schizophrenia [facility])     Past Surgical History:  Procedure Laterality Date  . ESOPHAGOGASTRODUODENOSCOPY (EGD) WITH PROPOFOL N/A 10/10/2016   Procedure: ESOPHAGOGASTRODUODENOSCOPY (EGD) WITH PROPOFOL;  Surgeon: Lucilla Lame, MD;  Location: [facility] ENDOSCOPY;  Service: Endoscopy;  Laterality: N/A;  . HERNIA REPAIR    . VISCERAL ARTERY INTERVENTION N/A 10/11/2016   Procedure: Visceral Artery Intervention;  Surgeon: Algernon Huxley, MD;  Location: [facility] LAB;  Service: Cardiovascular;  Laterality: N/A;   Family History:  Family History  Problem Relation Age of Onset  . Diabetes Sister    Family Psychiatric  History: See previous Social History:  Social History   Substance and Sexual Activity  Alcohol Use Not Currently  .  Alcohol/week: 10.0 standard drinks  . Types: 10 Shots of liquor per week     Social History   Substance and Sexual Activity  Drug Use No    Social History   Socioeconomic History  . Marital status: Widowed    Spouse name: Not on file  . Number of children: Not on file  . Years of education: Not on file  . Highest education level: Not on file  Occupational History  . Not on file  Tobacco Use  . Smoking status: Current Every Day Smoker    Packs/day: 0.25  . Smokeless tobacco: Never Used  Vaping Use  . Vaping Use: Never used  Substance and Sexual Activity  . Alcohol use: Not Currently    Alcohol/week: 10.0 standard drinks    Types: 10 Shots of liquor per week  . Drug use: No  . Sexual activity: Yes    Birth control/protection: None  Other Topics Concern  . Not on file  Social History Narrative  . Not on file   Social Determinants of Health   Financial Resource Strain: Not on file  Food Insecurity: Not on file  Transportation Needs: Not on file  Physical Activity: Not on file  Stress: Not on file  Social Connections: Not on file   Additional Social History:    Allergies:  No Known Allergies  Labs:  Results for orders placed or performed during the hospital encounter of 05/14/20 (from the past 48 hour(s))  Glucose, capillary     Status: Abnormal   Collection  Time: 05/20/20  8:34 PM  Result Value Ref Range   Glucose-Capillary 104 (H) 70 - 99 mg/dL    Comment: Glucose reference range applies only to samples taken after fasting for at least 8 hours.  Glucose, capillary     Status: Abnormal   Collection Time: 05/20/20  9:57 PM  Result Value Ref Range   Glucose-Capillary 105 (H) 70 - 99 mg/dL    Comment: Glucose reference range applies only to samples taken after fasting for at least 8 hours.  Glucose, capillary     Status: Abnormal   Collection Time: 05/21/20  1:06 AM  Result Value Ref Range   Glucose-Capillary 109 (H) 70 - 99 mg/dL    Comment: Glucose  reference range applies only to samples taken after fasting for at least 8 hours.  Basic metabolic panel     Status: Abnormal   Collection Time: 05/21/20  3:36 AM  Result Value Ref Range   Sodium 139 135 - 145 mmol/L   Potassium 4.3 3.5 - 5.1 mmol/L   Chloride 101 98 - 111 mmol/L   CO2 29 22 - 32 mmol/L   Glucose, Bld 109 (H) 70 - 99 mg/dL    Comment: Glucose reference range applies only to samples taken after fasting for at least 8 hours.   BUN 16 8 - 23 mg/dL   Creatinine, Ser 0.97 0.61 - 1.24 mg/dL   Calcium 9.2 8.9 - 10.3 mg/dL   GFR, Estimated >60 >60 mL/min    Comment: (NOTE) Calculated using the CKD-EPI Creatinine Equation (2021)    Anion gap 9 5 - 15    Comment: Performed at [facility], [address] [place], [place] [address]  Glucose, capillary     Status: None   Collection Time: 05/21/20  4:20 AM  Result Value Ref Range   Glucose-Capillary 96 70 - 99 mg/dL    Comment: Glucose reference range applies only to samples taken after fasting for at least 8 hours.  Glucose, capillary     Status: None   Collection Time: 05/21/20  7:47 AM  Result Value Ref Range   Glucose-Capillary 79 70 - 99 mg/dL    Comment: Glucose reference range applies only to samples taken after fasting for at least 8 hours.  Glucose, capillary     Status: Abnormal   Collection Time: 05/21/20 11:31 AM  Result Value Ref Range   Glucose-Capillary 112 (H) 70 - 99 mg/dL    Comment: Glucose reference range applies only to samples taken after fasting for at least 8 hours.  Glucose, capillary     Status: None   Collection Time: 05/21/20  4:21 PM  Result Value Ref Range   Glucose-Capillary 92 70 - 99 mg/dL    Comment: Glucose reference range applies only to samples taken after fasting for at least 8 hours.  Glucose, capillary     Status: Abnormal   Collection Time: 05/21/20  7:39 PM  Result Value Ref Range   Glucose-Capillary 109 (H) 70 - 99 mg/dL    Comment: Glucose reference range applies  only to samples taken after fasting for at least 8 hours.  Glucose, capillary     Status: Abnormal   Collection Time: 05/22/20  1:33 AM  Result Value Ref Range   Glucose-Capillary 136 (H) 70 - 99 mg/dL    Comment: Glucose reference range applies only to samples taken after fasting for at least 8 hours.  Glucose, capillary     Status: Abnormal  Collection Time: 05/22/20  8:07 AM  Result Value Ref Range   Glucose-Capillary 103 (H) 70 - 99 mg/dL    Comment: Glucose reference range applies only to samples taken after fasting for at least 8 hours.  Glucose, capillary     Status: Abnormal   Collection Time: 05/22/20 11:05 AM  Result Value Ref Range   Glucose-Capillary 114 (H) 70 - 99 mg/dL    Comment: Glucose reference range applies only to samples taken after fasting for at least 8 hours.  Blood gas, arterial     Status: Abnormal   Collection Time: 05/22/20 11:14 AM  Result Value Ref Range   FIO2 0.21    pH, Arterial 7.48 (H) 7.350 - 7.450   pCO2 arterial 43 32.0 - 48.0 mmHg   pO2, Arterial 75 (L) 83.0 - 108.0 mmHg   Bicarbonate 32.0 (H) 20.0 - 28.0 mmol/L   Acid-Base Excess 7.6 (H) 0.0 - 2.0 mmol/L   O2 Saturation 95.9 %   Patient temperature 37.0    Collection site RIGHT RADIAL    Sample type ARTERIAL DRAW    Allens test (pass/fail) POSITIVE (A) PASS    Comment: Performed at [facility], [address] [place], [place] [address]  Glucose, capillary     Status: Abnormal   Collection Time: 05/22/20 12:01 PM  Result Value Ref Range   Glucose-Capillary 123 (H) 70 - 99 mg/dL    Comment: Glucose reference range applies only to samples taken after fasting for at least 8 hours.  Glucose, capillary     Status: Abnormal   Collection Time: 05/22/20  4:35 PM  Result Value Ref Range   Glucose-Capillary 146 (H) 70 - 99 mg/dL    Comment: Glucose reference range applies only to samples taken after fasting for at least 8 hours.    Current Facility-Administered Medications   Medication Dose Route Frequency Provider Last Rate Last Admin  . acetaminophen (TYLENOL) tablet 325 mg  325 mg Oral Q6H PRN Cox, Amy N, DO       Or  . acetaminophen (TYLENOL) suppository 325 mg  325 mg Rectal Q6H PRN Cox, Amy N, DO      . albuterol (PROVENTIL) (2.5 MG/3ML) 0.083% nebulizer solution 2.5 mg  2.5 mg Inhalation Q6H PRN Cox, Amy N, DO      . buprenorphine-naloxone (SUBOXONE) 8-2 mg per SL tablet 1 tablet  1 tablet Sublingual Daily Swayze, Ava, DO   1 tablet at 05/21/20 0905  . Chlorhexidine Gluconate Cloth 2 % PADS 6 each  6 each Topical Daily Sharion Settler, NP   6 each at 05/22/20 1000  . citalopram (CELEXA) tablet 10 mg  10 mg Oral Daily Cox, Amy N, DO   10 mg at 05/21/20 0904  . enoxaparin (LOVENOX) injection 40 mg  40 mg Subcutaneous Q24H Oswald Hillock, RPH   40 mg at 05/21/20 2204  . ferrous sulfate tablet 325 mg  325 mg Oral Q breakfast Cox, Amy N, DO   325 mg at 05/21/20 2353  . hydrALAZINE (APRESOLINE) tablet 25 mg  25 mg Oral Q8H PRN Cox, Amy N, DO      . insulin aspart (novoLOG) injection 0-15 Units  0-15 Units Subcutaneous TID WC Swayze, Ava, DO   2 Units at 05/22/20 1249  . insulin aspart (novoLOG) injection 0-5 Units  0-5 Units Subcutaneous QHS Swayze, Ava, DO   2 Units at 05/19/20 2151  . insulin detemir (LEVEMIR) injection 10 Units  10 Units Subcutaneous Q2200  Cox, Amy N, DO   10 Units at 05/21/20 2206  . Ledipasvir-Sofosbuvir 90-400 MG TABS 1 tablet  1 tablet Oral Daily Swayze, Ava, DO      . multivitamin with minerals tablet 1 tablet  1 tablet Oral Daily Cox, Amy N, DO   1 tablet at 05/21/20 0904  . ondansetron (ZOFRAN) tablet 4 mg  4 mg Oral Q6H PRN Cox, Amy N, DO       Or  . ondansetron (ZOFRAN) injection 4 mg  4 mg Intravenous Q6H PRN Cox, Amy N, DO      . polyethylene glycol (MIRALAX / GLYCOLAX) packet 17 g  17 g Oral Daily PRN Cox, Amy N, DO      . polyvinyl alcohol (LIQUIFILM TEARS) 1.4 % ophthalmic solution 1 drop  1 drop Both Eyes PRN Swayze, Ava, DO       . tamsulosin (FLOMAX) capsule 0.8 mg  0.8 mg Oral Daily Cox, Amy N, DO   0.8 mg at 05/21/20 0904  . [START ON 05/23/2020] thiamine (B-1) injection 100 mg  100 mg Intravenous Daily Donnetta Simpers, MD      . vitamin B-12 (CYANOCOBALAMIN) tablet 1,000 mcg  1,000 mcg Oral Daily Donnetta Simpers, MD   1,000 mcg at 05/21/20 0905    Musculoskeletal: Strength & Muscle Tone: decreased Gait & Station: unable to stand Patient leans: N/A  Psychiatric Specialty Exam: Physical Exam Vitals and nursing note reviewed.  Constitutional:      Appearance: He is well-developed and well-nourished.  HENT:     Head: Normocephalic and atraumatic.  Eyes:     Conjunctiva/sclera: Conjunctivae normal.     Pupils: Pupils are equal, round, and reactive to light.  Cardiovascular:     Heart sounds: Normal heart sounds.  Pulmonary:     Effort: Pulmonary effort is normal.  Abdominal:     Palpations: Abdomen is soft.  Musculoskeletal:        General: Normal range of motion.     Cervical back: Normal range of motion.  Skin:    General: Skin is warm and dry.     Review of Systems  Unable to perform ROS: Patient nonverbal    Blood pressure 133/78, pulse 100, temperature 98.6 F (37 C), resp. rate 16, height 5\' 9"  (1.753 m), weight 78 kg, SpO2 97 %.Body mass index is 25.4 kg/m.  General Appearance: Casual  Eye Contact:  None  Speech:  Negative  Volume:  Decreased  Mood:  Negative  Affect:  Negative  Thought Process:  NA  Orientation:  Negative  Thought Content:  Negative  Suicidal Thoughts:  No  Homicidal Thoughts:  No  Memory:  Negative  Judgement:  Negative  Insight:  Negative  Psychomotor Activity:  Negative  Concentration:  Concentration: Negative  Recall:  Negative  Fund of Knowledge:  Negative  Language:  Negative  Akathisia:  Negative  Handed:  Right  AIMS (if indicated):     Assets:  Resilience  ADL's:  Impaired  Cognition:  Impaired,  Severe  Sleep:        Treatment Plan  Summary: Plan Patient seems markedly worse today.  Reviewed his medicine and I am not sure whether any of it could be related.  He is still on Suboxone which he has been given throughout this hospital stay.  I am not 100% clear where that came from I cannot find any record that he was being prescribed Suboxone outside the hospital but the low dose of it especially  since he has been getting it chronically would not I do not think because this sort of problem.  Would not expect that one could overdose on Suboxone very easily anyway.  Work-up is continuing.  No evidence of any change to the head CT.  Not going to add any medication at this point.  We will follow as needed.  Disposition: We will follow  Alethia Berthold, MD 05/22/2020 5:08 PM

## 2020-05-22 NOTE — Progress Notes (Signed)
[place] paged for RR; when [place] arrived, medical team attending to pt., who was sitting up in bed staring into the distance.  No other family present and no immediate needs evident.  [place] remains available as needed

## 2020-05-22 NOTE — Progress Notes (Signed)
PT Cancellation Note  Patient Details Name: Lance [name] MRN: [id number] DOB: 1946/02/06   Cancelled Treatment:    Reason Eval/Treat Not Completed: Other (comment). Per chart pt with apneic episodes, with orders for head CT & bipap. PT consulted nurse who confirms pt is not appropriate for PT treatment at this time. Will f/u as able & as pt is medically appropriate.   Lavone Nian, PT, DPT 05/22/20, 12:34 PM    Waunita Schooner 05/22/2020, 12:33 PM

## 2020-05-23 LAB — GLUCOSE, CAPILLARY
Glucose-Capillary: 139 mg/dL — ABNORMAL HIGH (ref 70–99)
Glucose-Capillary: 142 mg/dL — ABNORMAL HIGH (ref 70–99)
Glucose-Capillary: 163 mg/dL — ABNORMAL HIGH (ref 70–99)
Glucose-Capillary: 116 mg/dL — ABNORMAL HIGH (ref 70–99)

## 2020-05-23 MED ORDER — HALOPERIDOL LACTATE 5 MG/ML IJ SOLN
2.0000 mg | Freq: Four times a day (QID) | INTRAMUSCULAR | Status: DC | PRN
  Administered 2020-05-23: 2 mg via INTRAVENOUS
  Filled 2020-05-23: qty 1

## 2020-05-23 NOTE — Consult Note (Signed)
[facility] Consult   Reason for Consult: Follow-up for this 74 year old man with delirium Referring Physician: Swayze Patient Identification: Lance [name] MRN:  [id number] Principal Diagnosis: Subacute delirium Diagnosis:  Principal Problem:   Subacute delirium Active Problems:   Sepsis ([place])   Syncope   Total Time spent with patient: 30 minutes  Subjective:   Lance [name] is a 74 y.o. male patient admitted with "I am better".  HPI: Patient seen.  Compared to yesterday he really does look significantly better.  Yesterday he was not even responsive although his eyes were open.  Today he perked right up to his name.  Made eye contact.  Told me he was feeling a little better.  Beyond that his speech is still garbled and disorganized.  Still picking at his clothing with his mittens but not too badly.  Not pulling at lines not agitated.  He was able to calm down and relax when I told him that he was getting better and he needed to just try and rest.  Past Psychiatric History: Unclear possible substance abuse  Risk to Self:   Risk to Others:   Prior Inpatient Therapy:   Prior Outpatient Therapy:    Past Medical History:  Past Medical History:  Diagnosis Date   Diabetes mellitus without complication ([place])    Hepatitis C    Schizophrenia ([place])     Past Surgical History:  Procedure Laterality Date   ESOPHAGOGASTRODUODENOSCOPY (EGD) WITH PROPOFOL N/A 10/10/2016   Procedure: ESOPHAGOGASTRODUODENOSCOPY (EGD) WITH PROPOFOL;  Surgeon: Lucilla Lame, MD;  Location: [facility] ENDOSCOPY;  Service: Endoscopy;  Laterality: N/A;   HERNIA REPAIR     VISCERAL ARTERY INTERVENTION N/A 10/11/2016   Procedure: Visceral Artery Intervention;  Surgeon: Algernon Huxley, MD;  Location: [facility];  Service: Cardiovascular;  Laterality: N/A;   Family History:  Family History  Problem Relation Age of Onset   Diabetes Sister    Family Psychiatric  History: See  previous Social History:  Social History   Substance and Sexual Activity  Alcohol Use Not Currently   Alcohol/week: 10.0 standard drinks   Types: 10 Shots of liquor per week     Social History   Substance and Sexual Activity  Drug Use No    Social History   Socioeconomic History   Marital status: Widowed    Spouse name: Not on file   Number of children: Not on file   Years of education: Not on file   Highest education level: Not on file  Occupational History   Not on file  Tobacco Use   Smoking status: Current Every Day Smoker    Packs/day: 0.25   Smokeless tobacco: Never Used  Vaping Use   Vaping Use: Never used  Substance and Sexual Activity   Alcohol use: Not Currently    Alcohol/week: 10.0 standard drinks    Types: 10 Shots of liquor per week   Drug use: No   Sexual activity: Yes    Birth control/protection: None  Other Topics Concern   Not on file  Social History Narrative   Not on file   Social Determinants of Health   Financial Resource Strain: Not on file  Food Insecurity: Not on file  Transportation Needs: Not on file  Physical Activity: Not on file  Stress: Not on file  Social Connections: Not on file   Additional Social History:    Allergies:  No Known Allergies  Labs:  Results for orders placed or performed  during the hospital encounter of 05/14/20 (from the past 48 hour(s))  Glucose, capillary     Status: Abnormal   Collection Time: 05/21/20  7:39 PM  Result Value Ref Range   Glucose-Capillary 109 (H) 70 - 99 mg/dL    Comment: Glucose reference range applies only to samples taken after fasting for at least 8 hours.  Glucose, capillary     Status: Abnormal   Collection Time: 05/22/20  1:33 AM  Result Value Ref Range   Glucose-Capillary 136 (H) 70 - 99 mg/dL    Comment: Glucose reference range applies only to samples taken after fasting for at least 8 hours.  Glucose, capillary     Status: Abnormal   Collection Time:  05/22/20  8:07 AM  Result Value Ref Range   Glucose-Capillary 103 (H) 70 - 99 mg/dL    Comment: Glucose reference range applies only to samples taken after fasting for at least 8 hours.  Glucose, capillary     Status: Abnormal   Collection Time: 05/22/20 11:05 AM  Result Value Ref Range   Glucose-Capillary 114 (H) 70 - 99 mg/dL    Comment: Glucose reference range applies only to samples taken after fasting for at least 8 hours.  Blood gas, arterial     Status: Abnormal   Collection Time: 05/22/20 11:14 AM  Result Value Ref Range   FIO2 0.21    pH, Arterial 7.48 (H) 7.350 - 7.450   pCO2 arterial 43 32.0 - 48.0 mmHg   pO2, Arterial 75 (L) 83.0 - 108.0 mmHg   Bicarbonate 32.0 (H) 20.0 - 28.0 mmol/L   Acid-Base Excess 7.6 (H) 0.0 - 2.0 mmol/L   O2 Saturation 95.9 %   Patient temperature 37.0    Collection site RIGHT RADIAL    Sample type ARTERIAL DRAW    Allens test (pass/fail) POSITIVE (A) PASS    Comment: Performed at [facility], [address] [place], [place] [address]  Glucose, capillary     Status: Abnormal   Collection Time: 05/22/20 12:01 PM  Result Value Ref Range   Glucose-Capillary 123 (H) 70 - 99 mg/dL    Comment: Glucose reference range applies only to samples taken after fasting for at least 8 hours.  Glucose, capillary     Status: Abnormal   Collection Time: 05/22/20  4:35 PM  Result Value Ref Range   Glucose-Capillary 146 (H) 70 - 99 mg/dL    Comment: Glucose reference range applies only to samples taken after fasting for at least 8 hours.  Glucose, capillary     Status: Abnormal   Collection Time: 05/22/20  9:26 PM  Result Value Ref Range   Glucose-Capillary 177 (H) 70 - 99 mg/dL    Comment: Glucose reference range applies only to samples taken after fasting for at least 8 hours.  Glucose, capillary     Status: Abnormal   Collection Time: 05/23/20  7:43 AM  Result Value Ref Range   Glucose-Capillary 139 (H) 70 - 99 mg/dL    Comment: Glucose  reference range applies only to samples taken after fasting for at least 8 hours.  Glucose, capillary     Status: Abnormal   Collection Time: 05/23/20 12:09 PM  Result Value Ref Range   Glucose-Capillary 142 (H) 70 - 99 mg/dL    Comment: Glucose reference range applies only to samples taken after fasting for at least 8 hours.    Current Facility-Administered Medications  Medication Dose Route Frequency Provider Last Rate Last Admin  acetaminophen (TYLENOL) tablet 325 mg  325 mg Oral Q6H PRN Cox, Amy N, DO       Or   acetaminophen (TYLENOL) suppository 325 mg  325 mg Rectal Q6H PRN Cox, Amy N, DO       albuterol (PROVENTIL) (2.5 MG/3ML) 0.083% nebulizer solution 2.5 mg  2.5 mg Inhalation Q6H PRN Cox, Amy N, DO       buprenorphine-naloxone (SUBOXONE) 8-2 mg per SL tablet 1 tablet  1 tablet Sublingual Daily Swayze, Ava, DO   1 tablet at 05/23/20 0853   Chlorhexidine Gluconate Cloth 2 % PADS 6 each  6 each Topical Daily Sharion Settler, NP   6 each at 05/23/20 0901   citalopram (CELEXA) tablet 10 mg  10 mg Oral Daily Cox, Amy N, DO   10 mg at 05/23/20 0853   enoxaparin (LOVENOX) injection 40 mg  40 mg Subcutaneous Q24H Oswald Hillock, RPH   40 mg at 05/22/20 2144   ferrous sulfate tablet 325 mg  325 mg Oral Q breakfast Cox, Amy N, DO   325 mg at 05/23/20 2536   hydrALAZINE (APRESOLINE) tablet 25 mg  25 mg Oral Q8H PRN Cox, Amy N, DO       insulin aspart (novoLOG) injection 0-15 Units  0-15 Units Subcutaneous TID WC Swayze, Ava, DO   2 Units at 05/23/20 1216   insulin aspart (novoLOG) injection 0-5 Units  0-5 Units Subcutaneous QHS Swayze, Ava, DO   2 Units at 05/19/20 2151   insulin detemir (LEVEMIR) injection 10 Units  10 Units Subcutaneous Q2200 Cox, Amy N, DO   10 Units at 05/22/20 2143   Ledipasvir-Sofosbuvir 90-400 MG TABS 1 tablet  1 tablet Oral Daily Swayze, Ava, DO       multivitamin with minerals tablet 1 tablet  1 tablet Oral Daily Cox, Amy N, DO   1 tablet at 05/23/20  0853   ondansetron (ZOFRAN) tablet 4 mg  4 mg Oral Q6H PRN Cox, Amy N, DO       Or   ondansetron (ZOFRAN) injection 4 mg  4 mg Intravenous Q6H PRN Cox, Amy N, DO       polyethylene glycol (MIRALAX / GLYCOLAX) packet 17 g  17 g Oral Daily PRN Cox, Amy N, DO       polyvinyl alcohol (LIQUIFILM TEARS) 1.4 % ophthalmic solution 1 drop  1 drop Both Eyes PRN Swayze, Ava, DO       tamsulosin (FLOMAX) capsule 0.8 mg  0.8 mg Oral Daily Cox, Amy N, DO   0.8 mg at 05/23/20 6440   thiamine (B-1) injection 100 mg  100 mg Intravenous Daily Donnetta Simpers, MD   100 mg at 05/23/20 3474   vitamin B-12 (CYANOCOBALAMIN) tablet 1,000 mcg  1,000 mcg Oral Daily Donnetta Simpers, MD   1,000 mcg at 05/23/20 2595    Musculoskeletal: Strength & Muscle Tone: decreased Gait & Station: unsteady Patient leans: N/A  Psychiatric Specialty Exam: Physical Exam Vitals and nursing note reviewed.  Constitutional:      Appearance: He is well-developed and well-nourished.  HENT:     Head: Normocephalic and atraumatic.  Eyes:     Conjunctiva/sclera: Conjunctivae normal.     Pupils: Pupils are equal, round, and reactive to light.  Cardiovascular:     Heart sounds: Normal heart sounds.  Pulmonary:     Effort: Pulmonary effort is normal.  Abdominal:     Palpations: Abdomen is soft.  Musculoskeletal:  General: Normal range of motion.     Cervical back: Normal range of motion.  Skin:    General: Skin is warm and dry.  Psychiatric:        Attention and Perception: He is inattentive.        Mood and Affect: Mood is anxious.        Speech: He is noncommunicative. Speech is delayed.     Review of Systems  Unable to perform ROS: Mental status change    Blood pressure (!) 164/92, pulse 95, temperature 98.3 F (36.8 C), temperature source Axillary, resp. rate 20, height 5\' 9"  (1.753 m), weight 76.7 kg, SpO2 94 %.Body mass index is 24.96 kg/m.  General Appearance: Disheveled  Eye Contact:  Fair   Speech:  Garbled  Volume:  Decreased  Mood:  Euthymic  Affect:  Constricted  Thought Process:  Disorganized  Orientation:  Negative  Thought Content:  Illogical  Suicidal Thoughts:  No  Homicidal Thoughts:  No  Memory:  Negative  Judgement:  Negative  Insight:  Shallow  Psychomotor Activity:  Decreased  Concentration:  Concentration: Poor  Recall:  Poor  Fund of Knowledge:  Negative  Language:  Fair  Akathisia:  No  Handed:  Right  AIMS (if indicated):     Assets:  Desire for Improvement  ADL's:  Impaired  Cognition:  Impaired,  Mild  Sleep:        Treatment Plan Summary: Medication management and Plan What ever the reason was for his being so much more sedated and delirious yesterday he is improved today.  No changes were made to medication.  Not clear that any further work-up is been done since yesterday.  Encourage patient to get some rest and to try and eat and drink.  Will not make any changes to any orders.  We will follow-up to make sure things are getting better.  Disposition: No evidence of imminent risk to self or others at present.   Patient does not meet criteria for psychiatric inpatient admission. Supportive therapy provided about ongoing stressors.  Alethia Berthold, MD 05/23/2020 4:25 PM

## 2020-05-23 NOTE — Progress Notes (Addendum)
PROGRESS NOTE  Lance [name] TXM:[id number] DOB: 09-09-1945 DOA: 05/14/2020 PCP: Theotis Burrow, MD  Brief History   Lance [name] is a 74 y.o. male with medical history significant for hepatitis C was treated with Harv[place], insulin-dependent diabetes mellitus presented to the emergency department for chief concerns of being found unresponsive/altered mentation by his neighbors or friends.  No one is at bedside on my evaluation.  Patient does not want anyone to be called.  He does not know what happened.  He reports that he ate dinner today.  He denies anything out of the ordinary.  He denies drug use on day of admission.  He endorses compliance with medications.  ROS was negative for headache, vision changes, chest pain, shortness of breath, nausea, vomiting, dysphagia, fever, chills, dysuria, hematuria, diarrhea, weakness in legs, appetite changes, unintentional weight loss.   Social history: lives by himself. Endorses cocaine use and that the last time he used cocaine was about 1 week ago. He lasted drink etoh 1 week ago.   [facility] were consulted to admit the patient for further evaluation and treatment.   The patient tells me that he only remembers waking up on the ground. He states that he had no warning that he was going to pass out. No chest pain or shortness of breath prior to event. Feels fine now.  MRI brain demonstrates: Small or trace bilateral subdural hematomas, likely post traumatic,6 cm long axis right anterior frontal convexity Meningioma with extension to the midline, invasion and occlusion of the anterior-most superior sagittal sinusbutthe mid and posterior aspects of that sinus, as well as the other major dural venous structures appear to remain patent.. 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  Psychiatry has evaluated the patient and has found him to have subacute delirium, but not to require inpatient psychiatric placement at this time. No mental health treatment is required in their opinion other than substance abuse referral.  Neurology has signed off. They have recommended follow up with neurosurgery for his meningioma. Continue depakote. No further cocaine.   The patient has been evaluated by PT/OT. They have recommended SNF placement. TOC has been consulted for placement.  Pt very lethargic on 05/21/2020. Depakote dose reduced and patient less lethargic upon visit this morning. The patient spoke to me.   Rapid response called on this patient around 11:15. The patient was moribund. He was responsive to sternal rub and would speak, but mostly just stared with apneic episodes in respirations. The patient's vital signs and glucose were within normal limits including SaO2. ABG was obtained due to prolonged apneic periods. The pH was slightly alkalotic, but PCO2 was 44 and pAO2 was 73. All sedating medications were held.  A couple of hours later the patient was apparently fully awake, agitated, and swinging at nursing. HR at that time was 120-150. EKG ordered and oral depakote given. Will use injectable antipsychotics if necessary. Monitor.  The patient has been re-evaluated by Dr. Weber Cooks. He does not feel that any changes need to be made to the patient's treatment.  Consultants  . Neurology . Psychiatry  Procedures  . EEG  Antibiotics   Anti-infectives (From admission, onward)   Start     Dose/Rate Route Frequency Ordered Stop   05/15/20 1230  Ledipasvir-Sofosbuvir 90-400 MG TABS 1 tablet        1  tablet Oral Daily 05/15/20 1223     05/15/20 0900  vancomycin (VANCOREADY) IVPB 750 mg/150 mL  Status:  Discontinued        750 mg 150 mL/hr over 60 Minutes Intravenous Every 12 hours 05/14/20 2251 05/16/20 1809   05/15/20 0800   ceFEPIme (MAXIPIME) 2 g in sodium chloride 0.9 % 100 mL IVPB  Status:  Discontinued        2 g 200 mL/hr over 30 Minutes Intravenous Every 12 hours 05/14/20 2250 05/15/20 1321   05/14/20 2300  vancomycin (VANCOCIN) IVPB 1000 mg/200 mL premix        1,000 mg 200 mL/hr over 60 Minutes Intravenous  Once 05/14/20 2250 05/15/20 0156   05/14/20 2245  ceFEPIme (MAXIPIME) 2 g in sodium chloride 0.9 % 100 mL IVPB  Status:  Discontinued        2 g 200 mL/hr over 30 Minutes Intravenous  Once 05/14/20 2244 05/14/20 2248   05/14/20 2245  vancomycin (VANCOCIN) IVPB 1000 mg/200 mL premix  Status:  Discontinued        1,000 mg 200 mL/hr over 60 Minutes Intravenous  Once 05/14/20 2244 05/14/20 2248   05/14/20 2245  metroNIDAZOLE (FLAGYL) tablet 500 mg  Status:  Discontinued        500 mg Oral Every 8 hours 05/14/20 2244 05/14/20 2248   05/14/20 1930  ceFEPIme (MAXIPIME) 2 g in sodium chloride 0.9 % 100 mL IVPB        2 g 200 mL/hr over 30 Minutes Intravenous  Once 05/14/20 1923 05/14/20 2106   05/14/20 1930  metroNIDAZOLE (FLAGYL) IVPB 500 mg        500 mg 100 mL/hr over 60 Minutes Intravenous  Once 05/14/20 1923 05/14/20 2106   05/14/20 1930  vancomycin (VANCOCIN) IVPB 1000 mg/200 mL premix        1,000 mg 200 mL/hr over 60 Minutes Intravenous  Once 05/14/20 1923 05/14/20 2219      Subjective  The patient is fairly lethargic for the second day. No new complaints.  Objective   Vitals:  Vitals:   05/23/20 1211 05/23/20 1637  BP: (!) 164/92 136/90  Pulse: 95 (!) 104  Resp: 20 17  Temp: 98.3 F (36.8 C) 98.1 F (36.7 C)  SpO2: 94%    Exam:  Constitutional:  . The patient is lethargic thsi am, but less so than yesterday. Answers questions appropriately. No acute distress. Respiratory:  . No increased work of breathing. . No wheezes, rales, or rhonchi . No tactile fremitus Cardiovascular:  . Regular rate and rhythm . No murmurs, ectopy, or gallups. . No lateral PMI. No  thrills. Abdomen:  . Abdomen is soft, non-tender, non-distended . No hernias, masses, or organomegaly . Normoactive bowel sounds.  Musculoskeletal:  . No cyanosis, clubbing, or edema Skin:  . No rashes, lesions, ulcers . palpation of skin: no induration or nodules Neurologic:  . CN 2-12 intact . Sensation all 4 extremities intact Psychiatric:  . Mental status o Mood, affect appropriate o Orientation to person, place, time  . judgment and insight appear intact  I have personally reviewed the following:   Today's Data  . Vitals, BMP  Micro Data  . Blood cultures x 2: No growth  Imaging  . CT head . MRI Brain  Cardiology Data  . EKG . Echocardiogram pending  Other Data  . EEG  Scheduled Meds: . buprenorphine-naloxone  1 tablet Sublingual Daily  . Chlorhexidine Gluconate Cloth  6 each Topical  Daily  . citalopram  10 mg Oral Daily  . enoxaparin (LOVENOX) injection  40 mg Subcutaneous Q24H  . ferrous sulfate  325 mg Oral Q breakfast  . insulin aspart  0-15 Units Subcutaneous TID WC  . insulin aspart  0-5 Units Subcutaneous QHS  . insulin detemir  10 Units Subcutaneous Q2200  . Ledipasvir-Sofosbuvir  1 tablet Oral Daily  . multivitamin with minerals  1 tablet Oral Daily  . tamsulosin  0.8 mg Oral Daily  . thiamine injection  100 mg Intravenous Daily  . vitamin B-12  1,000 mcg Oral Daily   Continuous Infusions:   Principal Problem:   Subacute delirium Active Problems:   Sepsis ([place])   Syncope   LOS: 8 days   A & P  Altered level of consciousness: The patient has had swings of behavior between being cooperative and lucid to being confused, agitated aond combative. Concern for seizure activity. I have discussed the patient with Neurology. Seizure activity vs encephalopathy/delirium secondary to alcohol and cocaine abuse. Somewhat lethargic for the last 2 days. Dose of Depakote decreased from 500 mg bid to 375 PO bid. Pt more reponsive this morning.  Sepsis:  Doubt. Leukocytosis - due to demargination/reactive. Lactic acid - due to syncope/seizure/cocaine abuse. Resolved. No fever, hypotension, or likely source of infection. Procalcitonin negative. Antibiotics stopped.   Syncope/Seizure/AlteredMentalStatus/Post ictal period: Seizure would explain lactic acidosis, decreased level of consciousness, and leukocytosis. Possibly due to cocaine abuse and/or meningioma in the f. Echocardiogram ordered to evaluate for syncope. The patient is being monitored on telemetry. I have discussed the patient with Dr. Lorrin Goodell. He will consult and he has ordered an EEG. He recommends that Keppra be initiated if the patient has further episodes. Keppra has been changed to depakote. Dr. Sarita Haver feels that the patient's presentation was caused by meningioma with vasogenic edema placing pressure on the right frontal lobe. This in the setting of syncope likely due to cocaine abuse and then SDH caused by fall.  Insulin-dependent diabetes mellitus-resumed home insulin. FSBS for the past 24 hours has run 168 - 210. HbA1c is 10.8.   HIV: Continue antiretroviral regimen.  Debility: PT/OT have recommended SNF. TOC consult has been placed.   I have seen and examined the patient myself. I have spent 32 minutes in his evaluation and care.  DVT prophylaxis: enoxaparin  Code Status: full code Diet: Carb modified/heart healthy Family Communication: he does not want anyone updated Disposition Plan: PT has recommended SNf placement. TOC has been consulted to assist. No safe discharge at this time.  Dantrell Schertzer, DO [facility] Direct contact: see [email or web address]  7PM-7AM contact night coverage as above 05/23/2020, 5:08 PM  LOS: 0 days

## 2020-05-23 NOTE — Progress Notes (Signed)
OT Cancellation Note  Patient Details Name: Lance [name] MRN: [id number] DOB: 12/30/1945   Cancelled Treatment:    Reason Eval/Treat Not Completed: Patient not medically ready. Chart reviewed. Spoke with RN.  RN states patient is not appropriate for therapy. Will complete orders at this time. Please re-order if patient becomes able to participate.    Jeni Salles, MPH, MS, OTR/L ascom [phone] 05/23/20, 3:50 PM

## 2020-05-23 NOTE — Progress Notes (Signed)
PT Cancellation Note  Patient Details Name: Lance [name] MRN: [id number] DOB: February 08, 1946   Cancelled Treatment:    Reason Eval/Treat Not Completed: Medical issues which prohibited therapy. RN states patient is not appropriate for therapy. Will complete orders at this time. Please re-order if patient becomes able to participate.     [name] 05/23/2020, 10:59 AM

## 2020-05-24 LAB — CBC WITH DIFFERENTIAL/PLATELET
Abs Immature Granulocytes: 0.04 10*3/uL (ref 0.00–0.07)
Basophils Absolute: 0 10*3/uL (ref 0.0–0.1)
Basophils Relative: 0 %
Eosinophils Absolute: 0.1 10*3/uL (ref 0.0–0.5)
Eosinophils Relative: 1 %
HCT: 38.5 % — ABNORMAL LOW (ref 39.0–52.0)
Hemoglobin: 12.6 g/dL — ABNORMAL LOW (ref 13.0–17.0)
Immature Granulocytes: 0 %
Lymphocytes Relative: 15 %
Lymphs Abs: 1.5 10*3/uL (ref 0.7–4.0)
MCH: 30.1 pg (ref 26.0–34.0)
MCHC: 32.7 g/dL (ref 30.0–36.0)
MCV: 92.1 fL (ref 80.0–100.0)
Monocytes Absolute: 1 10*3/uL (ref 0.1–1.0)
Monocytes Relative: 11 %
Neutro Abs: 7.2 10*3/uL (ref 1.7–7.7)
Neutrophils Relative %: 73 %
Platelets: 246 10*3/uL (ref 150–400)
RBC: 4.18 MIL/uL — ABNORMAL LOW (ref 4.22–5.81)
RDW: 12.1 % (ref 11.5–15.5)
WBC: 9.9 10*3/uL (ref 4.0–10.5)
nRBC: 0 % (ref 0.0–0.2)

## 2020-05-24 LAB — COMPREHENSIVE METABOLIC PANEL
ALT: 18 U/L (ref 0–44)
AST: 19 U/L (ref 15–41)
Albumin: 3.3 g/dL — ABNORMAL LOW (ref 3.5–5.0)
Alkaline Phosphatase: 75 U/L (ref 38–126)
Anion gap: 13 (ref 5–15)
BUN: 22 mg/dL (ref 8–23)
CO2: 28 mmol/L (ref 22–32)
Calcium: 9.4 mg/dL (ref 8.9–10.3)
Chloride: 104 mmol/L (ref 98–111)
Creatinine, Ser: 1.02 mg/dL (ref 0.61–1.24)
GFR, Estimated: >60 mL/min (ref >60)
Glucose, Bld: 121 mg/dL — ABNORMAL HIGH (ref 70–99)
Potassium: 4.2 mmol/L (ref 3.5–5.1)
Sodium: 145 mmol/L (ref 135–145)
Total Bilirubin: 1 mg/dL (ref 0.3–1.2)
Total Protein: 8.4 g/dL — ABNORMAL HIGH (ref 6.5–8.1)

## 2020-05-24 LAB — GLUCOSE, CAPILLARY
Glucose-Capillary: 119 mg/dL — ABNORMAL HIGH (ref 70–99)
Glucose-Capillary: 145 mg/dL — ABNORMAL HIGH (ref 70–99)
Glucose-Capillary: 93 mg/dL (ref 70–99)
Glucose-Capillary: 145 mg/dL — ABNORMAL HIGH (ref 70–99)
Glucose-Capillary: 143 mg/dL — ABNORMAL HIGH (ref 70–99)

## 2020-05-24 MED ORDER — HALOPERIDOL LACTATE 5 MG/ML IJ SOLN: 2.0000 mg | Freq: Three times a day (TID) | INTRAMUSCULAR | Status: DC | PRN

## 2020-05-24 MED ORDER — HALOPERIDOL 2 MG PO TABS
2.0000 mg | ORAL_TABLET | Freq: Three times a day (TID) | ORAL | Status: DC | PRN
  Filled 2020-05-24: qty 1

## 2020-05-24 NOTE — Progress Notes (Signed)
Pt agitated and restless, unable to reorient to safety.  MD contacted, new PRN orders placed and given.  Pt now resting well with sitter in room.  RN will continue to monitor.  Justice Britain, RN

## 2020-05-24 NOTE — Progress Notes (Addendum)
PROGRESS NOTE    Lance [name]  YQM:[id number] DOB: Nov 17, 1945 DOA: 05/14/2020 PCP: Theotis Burrow, MD    Brief Narrative:  74 y.o.malewith medical history significant forhepatitis C was treated with Harvoni, insulin-dependent diabetes mellitus presented to the emergency department for chief concerns of being found unresponsive/altered mentation by his neighbors or friends.  MRI followed with further work-up.  Demonstrates findings consistent with amyloid angiopathy.  Patient does admit to cocaine use which he was strongly advised against given the high risk of intracranial hemorrhage in the setting of amyloid angiopathy  Psychiatry was consulted due to subacute delirium but felt he did not meet criteria for inpatient psychiatric placement.  Patient also had a meningioma seen on imaging.  Neurology and neurosurgery were on board however both are signed off.  Recommend outpatient follow-up with neurosurgery for meningioma and to continue Depakote for antiepileptic therapy  Patient been evaluated by therapy.  Have recommended SNF.  As of 05/24/2020 no bed offers have been made.   Assessment & Plan:   Principal Problem:   Subacute delirium Active Problems:   Sepsis ([place])   Syncope  Acute toxic metabolic encephalopathy Unclear etiology Patient has wide swings and behavior between cooperative and lucid to combative and confused Concern for possible seizure activity Also encephalopathy delirium secondary to substance abuse Per neurology AMS secondary to meningioma and slight vasogenic edema Plan: Continue Depakote Delirium precautions Frequent reorienting measures Avoid sedatives  Sepsis felt unlikely, ruled out Procalcitonin negative.  No indication for antibiotics  Insulin-dependent diabetes mellitus Basal bolus regimen Last A1c 10.8, poor control  Substance abuse Patient counseled on cocaine cessation  Functional decline PT OT recommending SNF, no bed  offers yet  HIV Continue ART regimen      DVT prophylaxis: Lovenox Code Status: Full Family Communication: Daughter Margaretmary Lombard [phone] Disposition Plan: Status is: Inpatient  Remains inpatient appropriate because:Altered mental status and Inpatient level of care appropriate due to severity of illness   Dispo: The patient is from: Home              Anticipated d/c is to: SNF              Anticipated d/c date is: 2 days              Patient currently is not medically stable to d/c.  Remains altered, less lethargic and combative.  Anticipate discharge to skilled nursing facility once bed is found       Consultants:   Neurology  Psychiatry  Procedures:   None  Antimicrobials:   None   Subjective: Seen and examined.  Pleasant and cooperative.  Garbled speech but does answer questions appropriately  Objective: Vitals:   05/24/20 0446 05/24/20 0548 05/24/20 0709 05/24/20 1118  BP:  (!) 160/96 (!) 159/99 (!) 156/96  Pulse:  96 97 98  Resp:   17 17  Temp:  98.6 F (37 C) 97.9 F (36.6 C) 98 F (36.7 C)  TempSrc:  Oral Oral Oral  SpO2:  100% 100% 100%  Weight: 74.1 kg     Height:        Intake/Output Summary (Last 24 hours) at 05/24/2020 1431 Last data filed at 05/24/2020 1131 Gross per 24 hour  Intake 120 ml  Output 975 ml  Net -855 ml   Filed Weights   05/22/20 0354 05/23/20 0434 05/24/20 0446  Weight: 78 kg 76.7 kg 74.1 kg    Examination:  General exam: No acute distress.  Appears chronically  ill Respiratory system: Bibasilar crackles.  Normal work of breathing.  Room air Cardiovascular system: S1 & S2 heard, RRR. No JVD, murmurs, rubs, gallops or clicks. No pedal edema. Gastrointestinal system: Abdomen is nondistended, soft and nontender. No organomegaly or masses felt. Normal bowel sounds heard. Central nervous system: Alert, oriented x2, garbled speech, no focal deficits Extremities: Symmetric 5 x 5 power. Skin: No rashes, lesions  or ulcers Psychiatry: Judgement and insight appear impaired. Mood & affect flattened.     Data Reviewed: I have personally reviewed following labs and imaging studies  CBC: Recent Labs  Lab 05/20/20 0443 05/24/20 0425  WBC 8.2 9.9  NEUTROABS 5.3 7.2  HGB 12.4* 12.6*  HCT 36.5* 38.5*  MCV 91.3 92.1  PLT 194 937   Basic Metabolic Panel: Recent Labs  Lab 05/19/20 0422 05/20/20 0443 05/21/20 0336 05/24/20 0425  NA 138 139 139 145  K 4.4 4.2 4.3 4.2  CL 102 102 101 104  CO2 27 28 29 28   GLUCOSE 202* 159* 109* 121*  BUN 14 14 16 22   CREATININE 0.99 0.92 0.97 1.02  CALCIUM 9.0 9.1 9.2 9.4   GFR: Estimated Creatinine Clearance: 65.5 mL/min (by C-G formula based on SCr of 1.02 mg/dL). Liver Function Tests: Recent Labs  Lab 05/24/20 0425  AST 19  ALT 18  ALKPHOS 75  BILITOT 1.0  PROT 8.4*  ALBUMIN 3.3*   No results for input(s): LIPASE, AMYLASE in the last 168 hours. No results for input(s): AMMONIA in the last 168 hours. Coagulation Profile: No results for input(s): INR, PROTIME in the last 168 hours. Cardiac Enzymes: No results for input(s): CKTOTAL, CKMB, CKMBINDEX, TROPONINI in the last 168 hours. BNP (last 3 results) No results for input(s): PROBNP in the last 8760 hours. HbA1C: No results for input(s): HGBA1C in the last 72 hours. CBG: Recent Labs  Lab 05/23/20 1209 05/23/20 1726 05/23/20 2021 05/24/20 0716 05/24/20 1122  GLUCAP 142* 163* 116* 119* 145*   Lipid Profile: No results for input(s): CHOL, HDL, LDLCALC, TRIG, CHOLHDL, LDLDIRECT in the last 72 hours. Thyroid Function Tests: No results for input(s): TSH, T4TOTAL, FREET4, T3FREE, THYROIDAB in the last 72 hours. Anemia Panel: No results for input(s): VITAMINB12, FOLATE, FERRITIN, TIBC, IRON, RETICCTPCT in the last 72 hours. Sepsis Labs: No results for input(s): PROCALCITON, LATICACIDVEN in the last 168 hours.  Recent Results (from the past 240 hour(s))  Urine culture     Status:  Abnormal   Collection Time: 05/14/20  5:20 PM   Specimen: Urine, Random  Result Value Ref Range Status   Specimen Description   Final    URINE, RANDOM Performed at [facility], [address], [place], [place] [address]    Special Requests   Final    NONE Performed at [facility], [address] [place], [place] [address]    Culture 70,000 COLONIES/mL ENTEROBACTER CLOACAE (A)  Final   Report Status 05/17/2020 FINAL  Final   Organism ID, Bacteria ENTEROBACTER CLOACAE (A)  Final      Susceptibility   Enterobacter cloacae - MIC*    CEFAZOLIN >=64 RESISTANT Resistant     CEFEPIME <=0.12 SENSITIVE Sensitive     CIPROFLOXACIN <=0.25 SENSITIVE Sensitive     GENTAMICIN <=1 SENSITIVE Sensitive     IMIPENEM 1 SENSITIVE Sensitive     NITROFURANTOIN 128 RESISTANT Resistant     TRIMETH/SULFA <=20 SENSITIVE Sensitive     PIP/TAZO 8 SENSITIVE Sensitive     * 70,000 COLONIES/mL ENTEROBACTER  CLOACAE  Blood culture (routine x 2)     Status: None   Collection Time: 05/14/20  6:22 PM   Specimen: BLOOD  Result Value Ref Range Status   Specimen Description BLOOD LEFT HAND  Final   Special Requests   Final    BOTTLES DRAWN AEROBIC AND ANAEROBIC Blood Culture adequate volume   Culture   Final    NO GROWTH 5 DAYS Performed at [facility], [address], [place], [place] [address]    Report Status 05/19/2020 FINAL  Final  Blood culture (routine x 2)     Status: None   Collection Time: 05/14/20  6:23 PM   Specimen: BLOOD  Result Value Ref Range Status   Specimen Description BLOOD RIGHT FA  Final   Special Requests   Final    BOTTLES DRAWN AEROBIC AND ANAEROBIC Blood Culture adequate volume   Culture   Final    NO GROWTH 5 DAYS Performed at [facility], [address] [place], [place] [address]    Report Status 05/19/2020 FINAL  Final  Resp Panel by RT-PCR (Flu A&B, Covid) Nasopharyngeal Swab     Status: None   Collection Time: 05/14/20  8:03 PM    Specimen: Nasopharyngeal Swab; Nasopharyngeal(NP) swabs in vial transport medium  Result Value Ref Range Status   SARS Coronavirus 2 by RT PCR NEGATIVE NEGATIVE Final    Comment: (NOTE) SARS-CoV-2 target nucleic acids are NOT DETECTED.  The SARS-CoV-2 RNA is generally detectable in upper respiratory specimens during the acute phase of infection. The lowest concentration of SARS-CoV-2 viral copies this assay can detect is 138 copies/mL. A negative result does not preclude SARS-Cov-2 infection and should not be used as the sole basis for treatment or other patient management decisions. A negative result may occur with  improper specimen collection/handling, submission of specimen other than nasopharyngeal swab, presence of viral mutation(s) within the areas targeted by this assay, and inadequate number of viral copies(<138 copies/mL). A negative result must be combined with clinical observations, patient history, and epidemiological information. The expected result is Negative.  Fact Sheet for Patients:  EntrepreneurPulse.com.au  Fact Sheet for Healthcare Providers:  IncredibleEmployment.be  This test is no t yet approved or cleared by the [place] FDA and  has been authorized for detection and/or diagnosis of SARS-CoV-2 by FDA under an Emergency Use Authorization (EUA). This EUA will remain  in effect (meaning this test can be used) for the duration of the COVID-19 declaration under Section 564(b)(1) of the Act, 21 U.S.C.section 360bbb-3(b)(1), unless the authorization is terminated  or revoked sooner.       Influenza A by PCR NEGATIVE NEGATIVE Final   Influenza B by PCR NEGATIVE NEGATIVE Final    Comment: (NOTE) The Xpert Xpress SARS-CoV-2/FLU/RSV plus assay is intended as an aid in the diagnosis of influenza from Nasopharyngeal swab specimens and should not be used as a sole basis for treatment. Nasal washings and aspirates are  unacceptable for Xpert Xpress SARS-CoV-2/FLU/RSV testing.  Fact Sheet for Patients: EntrepreneurPulse.com.au  Fact Sheet for Healthcare Providers: IncredibleEmployment.be  This test is not yet approved or cleared by the [place] FDA and has been authorized for detection and/or diagnosis of SARS-CoV-2 by FDA under an Emergency Use Authorization (EUA). This EUA will remain in effect (meaning this test can be used) for the duration of the COVID-19 declaration under Section 564(b)(1) of the Act, 21 U.S.C. section 360bbb-3(b)(1), unless the authorization is terminated or revoked.  Performed at  [facility], [address], [place], [place] [address]          Radiology Studies: No results found.      Scheduled Meds: . buprenorphine-naloxone  1 tablet Sublingual Daily  . Chlorhexidine Gluconate Cloth  6 each Topical Daily  . citalopram  10 mg Oral Daily  . enoxaparin (LOVENOX) injection  40 mg Subcutaneous Q24H  . ferrous sulfate  325 mg Oral Q breakfast  . insulin aspart  0-15 Units Subcutaneous TID WC  . insulin aspart  0-5 Units Subcutaneous QHS  . insulin detemir  10 Units Subcutaneous Q2200  . Ledipasvir-Sofosbuvir  1 tablet Oral Daily  . multivitamin with minerals  1 tablet Oral Daily  . tamsulosin  0.8 mg Oral Daily  . thiamine injection  100 mg Intravenous Daily  . vitamin B-12  1,000 mcg Oral Daily   Continuous Infusions:   LOS: 9 days    Time spent: 25 minutes    Sidney Ace, MD [facility] Pager [phone]  If 7PM-7AM, please contact night-coverage 05/24/2020, 2:31 PM

## 2020-05-24 NOTE — Plan of Care (Signed)
  Problem: Education: Goal: Knowledge of General Education information will improve Description: Including pain rating scale, medication(s)/side effects and non-pharmacologic comfort measures Outcome: Not Progressing   Problem: Health Behavior/Discharge Planning: Goal: Ability to manage health-related needs will improve Outcome: Not Progressing   Problem: Activity: Goal: Risk for activity intolerance will decrease Outcome: Not Progressing   Problem: Coping: Goal: Level of anxiety will decrease Outcome: Not Progressing   Problem: Pain Managment: Goal: General experience of comfort will improve Outcome: Not Progressing   Problem: Safety: Goal: Ability to remain free from injury will improve Outcome: Not Progressing

## 2020-05-24 NOTE — Care Management Important Message (Signed)
Important Message  Patient Details  Name: Lance [name] MRN: [id number] Date of Birth: 1946/05/09   Medicare Important Message Given:  Yes     Dannette Barbara 05/24/2020, 10:57 AM

## 2020-05-25 LAB — GLUCOSE, CAPILLARY
Glucose-Capillary: 108 mg/dL — ABNORMAL HIGH (ref 70–99)
Glucose-Capillary: 170 mg/dL — ABNORMAL HIGH (ref 70–99)
Glucose-Capillary: 137 mg/dL — ABNORMAL HIGH (ref 70–99)
Glucose-Capillary: 95 mg/dL (ref 70–99)
Glucose-Capillary: 98 mg/dL (ref 70–99)

## 2020-05-25 NOTE — TOC Progression Note (Signed)
Transition of Care [facility]) - Progression Note    Patient Details  Name: Lance [name] MRN: [id number] Date of Birth: 1946/01/01  Transition of [facility]) CM/SW [place], LCSW Phone Number: 05/25/2020, 2:00 PM  Clinical Narrative:    Referral resent to facilities and many denials and no bed offers. CSW to discuss case with [facility] Supervisor. In the meantime CSW asked MD to order therapies to continue working with pt in the case that pt becomes independent enough to dc home.   Expected Discharge Plan: [place] Barriers to Discharge: Continued Medical Work up  Expected Discharge Plan and Services Expected Discharge Plan: [place] In-house Referral: Clinical Social Work   Post Acute Care Choice: [place] Living arrangements for the past 2 months: Single Family Home                                       Social Determinants of Health (SDOH) Interventions    Readmission Risk Interventions No flowsheet data found.

## 2020-05-25 NOTE — Progress Notes (Signed)
PROGRESS NOTE    Lance [name]  WGN:[id number] DOB: [birth year] DOA: 05/14/2020 PCP: Theotis Burrow, MD    Brief Narrative:  74 y.o.malewith medical history significant forhepatitis C was treated with Harvoni, insulin-dependent diabetes mellitus presented to the emergency department for chief concerns of being found unresponsive/altered mentation by his neighbors or friends.  MRI followed with further work-up.  Demonstrates findings consistent with amyloid angiopathy.  Patient does admit to cocaine use which he was strongly advised against given the high risk of intracranial hemorrhage in the setting of amyloid angiopathy  Psychiatry was consulted due to subacute delirium but felt he did not meet criteria for inpatient psychiatric placement.  Patient also had a meningioma seen on imaging.  Neurology and neurosurgery were on board however both are signed off.  Recommend outpatient follow-up with neurosurgery for meningioma and to continue Depakote for antiepileptic therapy  Patient been evaluated by therapy.  Have recommended SNF.  As of 05/24/2020 no bed offers have been made.   Assessment & Plan:   Principal Problem:   Subacute delirium Active Problems:   Sepsis ([place])   Syncope  Acute toxic metabolic encephalopathy Unclear etiology Patient has wide swings and behavior between cooperative and lucid to combative and confused Concern for possible seizure activity Also encephalopathy delirium secondary to substance abuse Per neurology AMS secondary to meningioma and slight vasogenic edema 12/16: Patient was alert awake and give appropriate responses to my interview questions this morning.  Mental status appears to be substantially improved from prior.  No recent reports of combative or aggressive behavior.  Patient has been adherent and compliant with recommendations of medical care. Plan: Delirium precautions Frequent reorienting measures Avoid sedatives  Sepsis  felt unlikely, ruled out Procalcitonin negative.  No indication for antibiotics  Insulin-dependent diabetes mellitus Basal bolus regimen Last A1c 10.8, poor control  Substance abuse Patient counseled on cocaine cessation  Functional decline PT OT recommending SNF, no bed offers yet  HIV Continue ART regimen      DVT prophylaxis: Lovenox Code Status: Full Family Communication: Daughter Margaretmary Lombard [phone] on 12/16 Disposition Plan: Status is: Inpatient  Remains inpatient appropriate because:Unsafe d/c plan   Dispo: The patient is from: Home              Anticipated d/c is to: SNF              Anticipated d/c date is: 1 day              Patient currently is medically stable to d/c.   Patient is medically stable for discharge.  He has not demonstrated any aggressive or combative behavior.  He has been adherent to all medical and nursing care.  Patient now medically stable for discharge.             Consultants:   Neurology  Psychiatry  Procedures:   None  Antimicrobials:   None   Subjective: Seen and examined.  Pleasant and cooperative.  Clear and appropriate speech and thought pattern. Objective: Vitals:   05/24/20 1922 05/25/20 0521 05/25/20 0838 05/25/20 1135  BP: (!) 148/95 (!) 166/89 (!) 147/82 (!) 141/84  Pulse: 97 75 81 93  Resp: 16  18 16   Temp: 98.4 F (36.9 C) 98.1 F (36.7 C) 98.4 F (36.9 C) 98.1 F (36.7 C)  TempSrc: Oral Oral    SpO2: 99% 100% 100% 100%  Weight:      Height:        Intake/Output Summary (Last  24 hours) at 05/25/2020 1356 Last data filed at 05/25/2020 1057 Gross per 24 hour  Intake 240 ml  Output 1375 ml  Net -1135 ml   Filed Weights   05/22/20 0354 05/23/20 0434 05/24/20 0446  Weight: 78 kg 76.7 kg 74.1 kg    Examination:  General exam: No acute distress.  Appears chronically ill Respiratory system: Bibasilar crackles.  Normal work of breathing.  Room air Cardiovascular system: S1 & S2  heard, RRR. No JVD, murmurs, rubs, gallops or clicks. No pedal edema. Gastrointestinal system: Abdomen is nondistended, soft and nontender. No organomegaly or masses felt. Normal bowel sounds heard. Central nervous system: Alert, oriented x2, no focal deficits Extremities: Symmetric 5 x 5 power. Skin: No rashes, lesions or ulcers Psychiatry: Judgement and insight appear normal. Mood & affect appropriate.     Data Reviewed: I have personally reviewed following labs and imaging studies  CBC: Recent Labs  Lab 05/20/20 0443 05/24/20 0425  WBC 8.2 9.9  NEUTROABS 5.3 7.2  HGB 12.4* 12.6*  HCT 36.5* 38.5*  MCV 91.3 92.1  PLT 194 539   Basic Metabolic Panel: Recent Labs  Lab 05/19/20 0422 05/20/20 0443 05/21/20 0336 05/24/20 0425  NA 138 139 139 145  K 4.4 4.2 4.3 4.2  CL 102 102 101 104  CO2 27 28 29 28   GLUCOSE 202* 159* 109* 121*  BUN 14 14 16 22   CREATININE 0.99 0.92 0.97 1.02  CALCIUM 9.0 9.1 9.2 9.4   GFR: Estimated Creatinine Clearance: 65.5 mL/min (by C-G formula based on SCr of 1.02 mg/dL). Liver Function Tests: Recent Labs  Lab 05/24/20 0425  AST 19  ALT 18  ALKPHOS 75  BILITOT 1.0  PROT 8.4*  ALBUMIN 3.3*   No results for input(s): LIPASE, AMYLASE in the last 168 hours. No results for input(s): AMMONIA in the last 168 hours. Coagulation Profile: No results for input(s): INR, PROTIME in the last 168 hours. Cardiac Enzymes: No results for input(s): CKTOTAL, CKMB, CKMBINDEX, TROPONINI in the last 168 hours. BNP (last 3 results) No results for input(s): PROBNP in the last 8760 hours. HbA1C: No results for input(s): HGBA1C in the last 72 hours. CBG: Recent Labs  Lab 05/24/20 1619 05/24/20 2029 05/24/20 2301 05/25/20 0808 05/25/20 1132  GLUCAP 93 145* 143* 108* 170*   Lipid Profile: No results for input(s): CHOL, HDL, LDLCALC, TRIG, CHOLHDL, LDLDIRECT in the last 72 hours. Thyroid Function Tests: No results for input(s): TSH, T4TOTAL, FREET4,  T3FREE, THYROIDAB in the last 72 hours. Anemia Panel: No results for input(s): VITAMINB12, FOLATE, FERRITIN, TIBC, IRON, RETICCTPCT in the last 72 hours. Sepsis Labs: No results for input(s): PROCALCITON, LATICACIDVEN in the last 168 hours.  No results found for this or any previous visit (from the past 240 hour(s)).       Radiology Studies: No results found.      Scheduled Meds: . buprenorphine-naloxone  1 tablet Sublingual Daily  . Chlorhexidine Gluconate Cloth  6 each Topical Daily  . citalopram  10 mg Oral Daily  . enoxaparin (LOVENOX) injection  40 mg Subcutaneous Q24H  . ferrous sulfate  325 mg Oral Q breakfast  . insulin aspart  0-15 Units Subcutaneous TID WC  . insulin aspart  0-5 Units Subcutaneous QHS  . insulin detemir  10 Units Subcutaneous Q2200  . Ledipasvir-Sofosbuvir  1 tablet Oral Daily  . multivitamin with minerals  1 tablet Oral Daily  . tamsulosin  0.8 mg Oral Daily  . thiamine injection  100  mg Intravenous Daily  . vitamin B-12  1,000 mcg Oral Daily   Continuous Infusions:   LOS: 10 days    Time spent: 15 minutes    Sidney Ace, MD [facility] Pager [phone]  If 7PM-7AM, please contact night-coverage 05/25/2020, 1:56 PM

## 2020-05-26 ENCOUNTER — Inpatient Hospital Stay: Payer: Medicare HMO

## 2020-05-26 DIAGNOSIS — E440 Moderate protein-calorie malnutrition: Secondary | ICD-10-CM | POA: Insufficient documentation

## 2020-05-26 LAB — BASIC METABOLIC PANEL
Anion gap: 12 (ref 5–15)
BUN: 27 mg/dL — ABNORMAL HIGH (ref 8–23)
CO2: 29 mmol/L (ref 22–32)
Calcium: 8.9 mg/dL (ref 8.9–10.3)
Chloride: 100 mmol/L (ref 98–111)
Creatinine, Ser: 1.24 mg/dL (ref 0.61–1.24)
GFR, Estimated: >60 mL/min (ref >60)
Glucose, Bld: 180 mg/dL — ABNORMAL HIGH (ref 70–99)
Potassium: 4 mmol/L (ref 3.5–5.1)
Sodium: 141 mmol/L (ref 135–145)

## 2020-05-26 LAB — CBC WITH DIFFERENTIAL/PLATELET
Abs Immature Granulocytes: 0.05 10*3/uL (ref 0.00–0.07)
Basophils Absolute: 0 10*3/uL (ref 0.0–0.1)
Basophils Relative: 0 %
Eosinophils Absolute: 0.2 10*3/uL (ref 0.0–0.5)
Eosinophils Relative: 2 %
HCT: 40.1 % (ref 39.0–52.0)
Hemoglobin: 12.7 g/dL — ABNORMAL LOW (ref 13.0–17.0)
Immature Granulocytes: 0 %
Lymphocytes Relative: 17 %
Lymphs Abs: 1.9 10*3/uL (ref 0.7–4.0)
MCH: 29.5 pg (ref 26.0–34.0)
MCHC: 31.7 g/dL (ref 30.0–36.0)
MCV: 93.3 fL (ref 80.0–100.0)
Monocytes Absolute: 0.8 10*3/uL (ref 0.1–1.0)
Monocytes Relative: 7 %
Neutro Abs: 8.4 10*3/uL — ABNORMAL HIGH (ref 1.7–7.7)
Neutrophils Relative %: 74 %
Platelets: 293 10*3/uL (ref 150–400)
RBC: 4.3 MIL/uL (ref 4.22–5.81)
RDW: 11.9 % (ref 11.5–15.5)
WBC: 11.4 10*3/uL — ABNORMAL HIGH (ref 4.0–10.5)
nRBC: 0 % (ref 0.0–0.2)

## 2020-05-26 LAB — GLUCOSE, CAPILLARY
Glucose-Capillary: 66 mg/dL — ABNORMAL LOW (ref 70–99)
Glucose-Capillary: 92 mg/dL (ref 70–99)
Glucose-Capillary: 174 mg/dL — ABNORMAL HIGH (ref 70–99)
Glucose-Capillary: 186 mg/dL — ABNORMAL HIGH (ref 70–99)
Glucose-Capillary: 159 mg/dL — ABNORMAL HIGH (ref 70–99)

## 2020-05-26 MED ORDER — ENSURE ENLIVE PO LIQD
237.0000 mL | Freq: Three times a day (TID) | ORAL | Status: DC
  Administered 2020-05-26 – 2020-06-15 (×59): 237 mL via ORAL

## 2020-05-26 NOTE — Evaluation (Signed)
Physical Therapy Evaluation Patient Details Name: Lance [name] MRN: [id number] DOB: 04-Dec-1945 Today's Date: 05/26/2020   History of Present Illness  Pt is a 74yo M admitted to [facility] on 05/14/20 because of concerns for AMS; friends found pt unresponsive on the ground and called EMS. Per EMS, CBG in 500's. Blood work concerning for leukocytosis and lactic acidosis. 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 PMH significant includes: T2DM, Hep C, schizophrenia, and sepsis.  Clinical Impression  Pt admitted with above diagnosis. Pt currently with functional limitations due to the deficits listed below (see "PT Problem List"). Upon entry, pt seated on bed with OT, awake and agreeable to participate. The pt is alert, pleasant, interactive, but unable to provide any info regarding prior level of function. Pt has clear delay in cognition, but able to follow simple commands with increased processing time. Pt only tolerating ~60ft AMB due to weakness, fatigue, increased RR/dyspnea. VSS during mobility. Patient's performance this date reveals decreased ability, independence, and tolerance in performing all basic mobility required for performance of activities of daily living. Pt requires additional DME, close physical assistance, and cues for safe participate in mobility. Pt will benefit from skilled PT intervention to increase independence and safety with basic mobility in preparation for discharge to the venue listed below.      Follow Up Recommendations SNF;Supervision/Assistance - 24 hour;Supervision for mobility/OOB    Equipment Recommendations  None recommended by PT    Recommendations for Other Services       Precautions / Restrictions Precautions Precautions: Fall Restrictions Weight Bearing Restrictions: No      Mobility  Bed Mobility                General bed mobility comments: at EOB upon entry    Transfers Overall transfer level: Needs assistance Equipment used: 1 person hand held assist Transfers: Sit to/from Stand Sit to Stand: Min assist;From elevated surface         General transfer comment: heavy cues for technique/safety  Ambulation/Gait Ambulation/Gait assistance: Min assist Gait Distance (Feet): 45 Feet Assistive device: Rolling walker (2 wheeled)       General Gait Details: 3-point segmented gait, author assists with turning RW, needs visual and verbal cues, appears weak intermitten tpartial collapse  Stairs            Wheelchair Mobility    Modified Rankin (Stroke Patients Only)       Balance Overall balance assessment: Needs assistance Sitting-balance support: Feet supported;No upper extremity supported Sitting balance-Leahy Scale: Good     Standing balance support: During functional activity Standing balance-Leahy Scale: Poor                               Pertinent Vitals/Pain Pain Assessment: No/denies pain    Home Living Family/patient expects to be discharged to:: Private residence               Home Equipment: Kasandra Knudsen - single point Additional Comments: Pt unable to communicate home set up due to AMS and lethargy; per chart review, pt lives at home alone and has used [facility] for ambulation.    Prior Function                 Hand Dominance   Dominant Hand: Right    Extremity/Trunk Assessment  Upper Extremity Assessment Upper Extremity Assessment: Generalized weakness    Lower Extremity Assessment Lower Extremity Assessment: Generalized weakness       Communication      Cognition Arousal/Alertness: Awake/alert Behavior During Therapy: Flat affect Overall Cognitive Status: Impaired/Different from baseline (delayed processing, delayed response time; follows one-step commands.)                                         General Comments      Exercises Other Exercises Other Exercises: 5xSTS from EOB c RW   Assessment/Plan    PT Assessment Patient needs continued PT services  PT Problem List Decreased mobility;Decreased safety awareness;Decreased coordination;Decreased activity tolerance;Decreased cognition;Decreased balance       PT Treatment Interventions Therapeutic exercise;Gait training;Balance training;Stair training;Neuromuscular re-education;Functional mobility training;Therapeutic activities    PT Goals (Current goals can be found in the Care Plan section)  Acute Rehab PT Goals Patient Stated Goal: None stated PT Goal Formulation: With patient Time For Goal Achievement: 06/09/20 Potential to Achieve Goals: Good    Frequency Min 2X/week   Barriers to discharge Decreased caregiver support      Co-evaluation               AM-PAC PT "6 Clicks" Mobility  Outcome Measure Help needed turning from your back to your side while in a flat bed without using bedrails?: A Little Help needed moving from lying on your back to sitting on the side of a flat bed without using bedrails?: A Little Help needed moving to and from a bed to a chair (including a wheelchair)?: A Lot Help needed standing up from a chair using your arms (e.g., wheelchair or bedside chair)?: A Lot Help needed to walk in hospital room?: A Lot Help needed climbing 3-5 steps with a railing? : A Lot 6 Click Score: 14    End of Session Equipment Utilized During Treatment: Gait belt Activity Tolerance: Patient tolerated treatment well;Patient limited by lethargy Patient left: in bed;with call bell/phone within reach;with nursing/sitter in room Nurse Communication: Mobility status PT Visit Diagnosis: Unsteadiness on feet (R26.81);Difficulty in walking, not elsewhere classified (R26.2)    Time: 3016-0109 PT Time Calculation (min) (ACUTE ONLY): 22 min   Charges:   PT Evaluation $PT Re-evaluation: 1 Re-eval PT  Treatments $Therapeutic Exercise: 8-22 mins        12:02 PM, 05/26/20 Etta Grandchild, PT, DPT Physical Therapist - [facility]  [phone] ([place])    Cher Egnor C 05/26/2020, 12:00 PM

## 2020-05-26 NOTE — Progress Notes (Signed)
Initial Nutrition Assessment  DOCUMENTATION CODES:   Non-severe (moderate) malnutrition in context of social or environmental circumstances  INTERVENTION:   Ensure Enlive po TID, each supplement provides 350 kcal and 20 grams of protein  MVI daily   Liberalize diet   NUTRITION DIAGNOSIS:   Moderate Malnutrition related to social / environmental circumstances as evidenced by 9 percent weight loss in 2 weeks, mild to moderate muscle depletions, mild fat depletions.  GOAL:   Patient will meet greater than or equal to 90% of their needs  MONITOR:   PO intake,Supplement acceptance,Labs,Weight trends,Skin,I & O's  REASON FOR ASSESSMENT:   LOS    ASSESSMENT:   74 y.o. male with medical history significant for hepatitis C was treated with Harvoni, insulin-dependent diabetes mellitus, exposure to HIV, schizophrenia, meningioma, depression and substance abuse who is admitted with AMS and possible seizure  Met with pt in room today. Pt is a poor historian and unable to provide a very detailed nutrition related history. Pt reports good appetite and oral intake in hospital; pt reports eating 100% of his breakfast this morning which included oatmeal, eggs and sausage. Pt is documented to be eating anywhere from sips/bites to 100% of meals in hospital. Per chart review, it appears that pt was eating 100% of meals during the beginning of his admit but his appetite and oral intake has decreased over the past week. Per chart, pt is down 15lbs(9%) over the past 2 weeks; this is significant weight loss. RD discussed with pt the importance of adequate nutrition needed to preserve lean muscle. Pt is willing to drink strawberry Ensure in hospital. RD will add supplements and liberalize pt's diet to help him meet his estimated needs. Of note, pt does not have a BM noted since admit; spoke with MD, plan is for KUB today.   Medications reviewed and include: celexa, lovenox, ferrous sulfate, insulin, MVI,  thiamine, B12  Labs reviewed: BUN 27(H) Wbc- 11.4(H) cbgs- 66, 92, 174 x 24 hrs AIC- 10.8(H)- 12/5  NUTRITION - FOCUSED PHYSICAL EXAM:  Flowsheet Row Most Recent Value  Orbital Region No depletion  Upper Arm Region Mild depletion  Thoracic and Lumbar Region No depletion  Buccal Region No depletion  Temple Region Mild depletion  Clavicle Bone Region Mild depletion  Clavicle and Acromion Bone Region Mild depletion  Scapular Bone Region No depletion  Dorsal Hand Mild depletion  Patellar Region Moderate depletion  Anterior Thigh Region Moderate depletion  Posterior Calf Region Moderate depletion  Edema (RD Assessment) None  Hair Reviewed  Eyes Reviewed  Mouth Reviewed  Skin Reviewed  Nails Reviewed     Diet Order:   Diet Order            Diet Carb Modified Fluid consistency: Thin; Room service appropriate? Yes  Diet effective now                EDUCATION NEEDS:   No education needs have been identified at this time  Skin:  Skin Assessment: Reviewed RN Assessment  Last BM:  12/14- smear  Height:   Ht Readings from Last 1 Encounters:  05/17/20 5' 9"  (1.753 m)    Weight:   Wt Readings from Last 1 Encounters:  05/26/20 74.7 kg    Ideal Body Weight:  72.7 kg  BMI:  Body mass index is 24.31 kg/m.  Estimated Nutritional Needs:   Kcal:  1900-2200kcal/day  Protein:  95-110g/day  Fluid:  >1.9L/day  Koleen Distance MS, RD, LDN Please refer to [facility]  for RD and/or RD on-call/weekend/after hours pager

## 2020-05-26 NOTE — Care Management Important Message (Signed)
Important Message  Patient Details  Name: Lance [name] MRN: [id number] Date of Birth: Oct 02, 1945   Medicare Important Message Given:  Yes     Dannette Barbara 05/26/2020, 12:10 PM

## 2020-05-26 NOTE — Evaluation (Signed)
Occupational Therapy Evaluation Patient Details Name: Lance [name] MRN: [id number] DOB: Feb 13, 1946 Today's Date: 05/26/2020    History of Present Illness Pt is a 74yo M admitted to [facility] on 05/14/20 because of concerns for AMS; friends found pt unresponsive on the ground and called EMS. Per EMS, CBG in 500's. Blood work concerning for leukocytosis and lactic acidosis. 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 PMH significant includes: T2DM, Hep C, schizophrenia, and sepsis.   Clinical Impression   Patient presenting with decreased I in self care, balance, functional transfers/endurance, safety awareness, and strength. Pt continues to be very confused and oriented to self. Falling asleep multiple times during session and transitions.  Patient reports being mod I with use of SPC and living at home alone PTA. Patient currently functioning at min - mod A ( LB) for self care tasks. Pt needing min A to stand from EOB but mod A for balance with mobility.  Patient will benefit from acute OT to increase overall independence in the areas of ADLs, functional mobility, and safety awareness in order to safely discharge to next venue of care.    Follow Up Recommendations  SNF;Supervision/Assistance - 24 hour    Equipment Recommendations  Other (comment) (defer to next venue of care)       Precautions / Restrictions Precautions Precautions: Fall Restrictions Weight Bearing Restrictions: No      Mobility Bed Mobility Overal bed mobility: Needs Assistance Bed Mobility: Supine to Sit;Rolling Rolling: Supervision   Supine to sit: Supervision     General bed mobility comments: min cuing for technique    Transfers Overall transfer level: Needs assistance Equipment used: 1 person hand held assist Transfers: Sit to/from Stand Sit to Stand: Min assist;From  elevated surface Stand pivot transfers: Min assist;Mod assist       General transfer comment: heavy cues for technique/safety    Balance Overall balance assessment: Needs assistance Sitting-balance support: Feet supported;No upper extremity supported Sitting balance-Leahy Scale: Good Sitting balance - Comments: Good seated balance at EOB as pt was able to perform anterior lean to adjust socks, without LOB.   Standing balance support: During functional activity Standing balance-Leahy Scale: Poor                             ADL either performed or assessed with clinical judgement   ADL Overall ADL's : Needs assistance/impaired Eating/Feeding: Set up;Sitting   Grooming: Wash/dry hands;Wash/dry face;Oral care;Sitting;Set up               Lower Body Dressing: Minimal assistance;Supervision/safety Lower Body Dressing Details (indicate cue type and reason): while seated in bed pt washing B feet and then donning socks but needing increased time to complete tasks and assistance to thread onto feet at first.                     Vision Baseline Vision/History: Wears glasses Patient Visual Report: No change from baseline              Pertinent Vitals/Pain Pain Assessment: Faces Faces Pain Scale: No hurt     Hand Dominance Right   Extremity/Trunk Assessment Upper Extremity Assessment Upper Extremity Assessment: Generalized weakness   Lower Extremity Assessment Lower Extremity Assessment: Generalized weakness   Cervical / Trunk Assessment Cervical / Trunk Assessment: Normal  Communication Communication Communication: No difficulties   Cognition Arousal/Alertness: Awake/alert Behavior During Therapy: Flat affect Overall Cognitive Status: Impaired/Different from baseline Area of Impairment: Orientation;Attention;Following commands;Safety/judgement;Awareness;Problem solving                 Orientation Level: Disoriented  to;Place;Time;Situation Current Attention Level: Focused   Following Commands: Follows one step commands with increased time Safety/Judgement: Decreased awareness of safety;Decreased awareness of deficits Awareness: Emergent Problem Solving: Slow processing;Decreased initiation;Difficulty sequencing;Requires verbal cues General Comments: Pt oriented to self only this session. He follows commands with increased time for self care. Plesant and cooperative overall.   General Comments       Exercises Other Exercises Other Exercises: 5xSTS from EOB c [place] expects to be discharged to:: Private residence Living Arrangements: Alone Available Help at Discharge: Family;Friend(s);Available PRN/intermittently Type of Home: House Home Access: Stairs to enter   Entrance Stairs-Rails: Right Home Layout: One level         Bathroom Toilet: Standard     Home Equipment: Cane - single point   Additional Comments: Pt unable to communicate home set up due to AMS and lethargy; per chart review, pt lives at home alone and has used [facility] for ambulation.      Prior Functioning/Environment Level of Independence: Independent with assistive device(s)                 OT Problem List: Decreased strength;Decreased activity tolerance;Decreased cognition;Impaired balance (sitting and/or standing);Decreased safety awareness      OT Treatment/Interventions: Self-care/ADL training;Therapeutic exercise;Balance training;Energy conservation;Therapeutic activities;DME and/or AE instruction;Patient/family education    OT Goals(Current goals can be found in the care plan section) Acute Rehab OT Goals Patient Stated Goal: go home OT Goal Formulation: With patient Time For Goal Achievement: 06/09/20 Potential to Achieve Goals: Good ADL Goals Pt Will Perform Grooming: with supervision Pt Will Transfer to Toilet: with supervision Pt Will Perform Toileting - Clothing  Manipulation and hygiene: with supervision Pt Will Perform Tub/Shower Transfer: with supervision  OT Frequency: Min 2X/week   Barriers to D/C: Decreased caregiver support             AM-PAC OT "6 Clicks" Daily Activity     Outcome Measure Help from another person eating meals?: A Little Help from another person taking care of personal grooming?: A Little Help from another person toileting, which includes using toliet, bedpan, or urinal?: A Lot Help from another person bathing (including washing, rinsing, drying)?: A Lot Help from another person to put on and taking off regular upper body clothing?: A Little Help from another person to put on and taking off regular lower body clothing?: A Lot 6 Click Score: 15   End of Session Equipment Utilized During Treatment: Rolling walker Nurse Communication: Mobility status;Precautions  Activity Tolerance: Patient limited by fatigue Patient left: in bed;with call bell/phone within reach;with bed alarm set;with nursing/sitter in room  OT Visit Diagnosis: Unsteadiness on feet (R26.81);Repeated falls (R29.6);Muscle weakness (generalized) (M62.81)                Time: 3845-3646 OT Time Calculation (min): 23 min Charges:  OT General Charges $OT Visit: 1 Visit OT Evaluation $OT Eval Low Complexity: 1 Low OT Treatments $Self Care/Home Management : 8-22 mins  Darleen Crocker, MS, OTR/L , CBIS ascom [phone]  05/26/20, 1:25 PM

## 2020-05-26 NOTE — Progress Notes (Signed)
PROGRESS NOTE    Lance [name]  KDT:[id number] DOB: [birth year] DOA: 05/14/2020 PCP: Theotis Burrow, MD    Brief Narrative:  74 y.o.malewith medical history significant forhepatitis C was treated with Harvoni, insulin-dependent diabetes mellitus presented to the emergency department for chief concerns of being found unresponsive/altered mentation by his neighbors or friends.  MRI followed with further work-up.  Demonstrates findings consistent with amyloid angiopathy.  Patient does admit to cocaine use which he was strongly advised against given the high risk of intracranial hemorrhage in the setting of amyloid angiopathy  Psychiatry was consulted due to subacute delirium but felt he did not meet criteria for inpatient psychiatric placement.  Patient also had a meningioma seen on imaging.  Neurology and neurosurgery were on board however both are signed off.  Recommend outpatient follow-up with neurosurgery for meningioma and to continue Depakote for antiepileptic therapy  Patient been evaluated by therapy.  Have recommended SNF.  As of 05/24/2020 no bed offers have been made.  Per case management if we are unable to find a skilled nursing facility that accepts patient will need to pursue home with home health.   Assessment & Plan:   Principal Problem:   Subacute delirium Active Problems:   Sepsis ([place])   Syncope   Malnutrition of moderate degree  Acute toxic metabolic encephalopathy Unclear etiology Patient has wide swings and behavior between cooperative and lucid to combative and confused Concern for possible seizure activity Also encephalopathy delirium secondary to substance abuse Per neurology AMS secondary to meningioma and slight vasogenic edema 12/16: Patient was alert awake and give appropriate responses to my interview questions this morning.  Mental status appears to be substantially improved from prior.  No recent reports of combative or aggressive  behavior.  Patient has been adherent and compliant with recommendations of medical care. 12/17: Mental status improving daily.  Patient alert and communicative.  Pleasant, no distress.  Appropriate responses to all questions asked. Plan: Delirium precautions Frequent reorienting measures Avoid sedatives  Sepsis felt unlikely, ruled out Procalcitonin negative.  No indication for antibiotics  Insulin-dependent diabetes mellitus Basal bolus regimen Last A1c 10.8, poor control  Substance abuse Patient counseled on cocaine cessation  Functional decline PT OT recommending SNF, no bed offers yet  HIV Continue ART regimen      DVT prophylaxis: Lovenox Code Status: Full Family Communication: Daughter Margaretmary Lombard [phone] on 12/17 Disposition Plan: Status is: Inpatient  Remains inpatient appropriate because:Unsafe d/c plan   Dispo: The patient is from: Home              Anticipated d/c is to: SNF              Anticipated d/c date is: 1 day              Patient currently is medically stable to d/c.   Patient is medically stable for discharge.  He has not demonstrated any aggressive or combative behavior.  He has been adherent to all medical and nursing care.  Patient now medically stable for discharge.   Consultants:   Neurology  Psychiatry  Procedures:   None  Antimicrobials:   None   Subjective: Patient seen and examined.  Pleasant cooperative.  No pain complaints.  Objective: Vitals:   05/25/20 1956 05/26/20 0342 05/26/20 0730 05/26/20 1122  BP: 113/80 (!) 144/99 127/83 123/84  Pulse: (!) 102 88 77 82  Resp: 19 18 18 18   Temp: 98 F (36.7 C) 98.4 F (36.9 C) 98.5  F (36.9 C) 98.6 F (37 C)  TempSrc: Oral Oral Oral Oral  SpO2: 98% 99% 100% 100%  Weight:  74.7 kg    Height:        Intake/Output Summary (Last 24 hours) at 05/26/2020 1443 Last data filed at 05/26/2020 1128 Gross per 24 hour  Intake 240 ml  Output 800 ml  Net -560 ml    Filed Weights   05/23/20 0434 05/24/20 0446 05/26/20 0342  Weight: 76.7 kg 74.1 kg 74.7 kg    Examination:  General exam: No acute distress.  Appears chronically ill Respiratory system: Bibasilar crackles.  Normal work of breathing.  Room air Cardiovascular system: S1 & S2 heard, RRR. No JVD, murmurs, rubs, gallops or clicks. No pedal edema. Gastrointestinal system: Abdomen is nondistended, soft and nontender. No organomegaly or masses felt. Normal bowel sounds heard. Central nervous system: Alert, oriented x2, no focal deficits Extremities: Symmetric 5 x 5 power. Skin: No rashes, lesions or ulcers Psychiatry: Judgement and insight appear normal. Mood & affect appropriate.     Data Reviewed: I have personally reviewed following labs and imaging studies  CBC: Recent Labs  Lab 05/20/20 0443 05/24/20 0425 05/26/20 1005  WBC 8.2 9.9 11.4*  NEUTROABS 5.3 7.2 8.4*  HGB 12.4* 12.6* 12.7*  HCT 36.5* 38.5* 40.1  MCV 91.3 92.1 93.3  PLT 194 246 841   Basic Metabolic Panel: Recent Labs  Lab 05/20/20 0443 05/21/20 0336 05/24/20 0425 05/26/20 1005  NA 139 139 145 141  K 4.2 4.3 4.2 4.0  CL 102 101 104 100  CO2 28 29 28 29   GLUCOSE 159* 109* 121* 180*  BUN 14 16 22  27*  CREATININE 0.92 0.97 1.02 1.24  CALCIUM 9.1 9.2 9.4 8.9   GFR: Estimated Creatinine Clearance: 53.8 mL/min (by C-G formula based on SCr of 1.24 mg/dL). Liver Function Tests: Recent Labs  Lab 05/24/20 0425  AST 19  ALT 18  ALKPHOS 75  BILITOT 1.0  PROT 8.4*  ALBUMIN 3.3*   No results for input(s): LIPASE, AMYLASE in the last 168 hours. No results for input(s): AMMONIA in the last 168 hours. Coagulation Profile: No results for input(s): INR, PROTIME in the last 168 hours. Cardiac Enzymes: No results for input(s): CKTOTAL, CKMB, CKMBINDEX, TROPONINI in the last 168 hours. BNP (last 3 results) No results for input(s): PROBNP in the last 8760 hours. HbA1C: No results for input(s): HGBA1C in the  last 72 hours. CBG: Recent Labs  Lab 05/25/20 2107 05/25/20 2234 05/26/20 0731 05/26/20 0758 05/26/20 1127  GLUCAP 95 98 66* 92 174*   Lipid Profile: No results for input(s): CHOL, HDL, LDLCALC, TRIG, CHOLHDL, LDLDIRECT in the last 72 hours. Thyroid Function Tests: No results for input(s): TSH, T4TOTAL, FREET4, T3FREE, THYROIDAB in the last 72 hours. Anemia Panel: No results for input(s): VITAMINB12, FOLATE, FERRITIN, TIBC, IRON, RETICCTPCT in the last 72 hours. Sepsis Labs: No results for input(s): PROCALCITON, LATICACIDVEN in the last 168 hours.  No results found for this or any previous visit (from the past 240 hour(s)).       Radiology Studies: No results found.      Scheduled Meds: . buprenorphine-naloxone  1 tablet Sublingual Daily  . Chlorhexidine Gluconate Cloth  6 each Topical Daily  . citalopram  10 mg Oral Daily  . enoxaparin (LOVENOX) injection  40 mg Subcutaneous Q24H  . feeding supplement  237 mL Oral TID BM  . ferrous sulfate  325 mg Oral Q breakfast  . insulin aspart  0-15 Units Subcutaneous TID WC  . insulin aspart  0-5 Units Subcutaneous QHS  . insulin detemir  10 Units Subcutaneous Q2200  . Ledipasvir-Sofosbuvir  1 tablet Oral Daily  . multivitamin with minerals  1 tablet Oral Daily  . tamsulosin  0.8 mg Oral Daily  . thiamine injection  100 mg Intravenous Daily  . vitamin B-12  1,000 mcg Oral Daily   Continuous Infusions:   LOS: 11 days    Time spent: 15 minutes    Sidney Ace, MD [facility] Pager [phone]  If 7PM-7AM, please contact night-coverage 05/26/2020, 2:43 PM

## 2020-05-27 ENCOUNTER — Encounter: Payer: Self-pay | Admitting: Internal Medicine

## 2020-05-27 LAB — GLUCOSE, CAPILLARY
Glucose-Capillary: 120 mg/dL — ABNORMAL HIGH (ref 70–99)
Glucose-Capillary: 192 mg/dL — ABNORMAL HIGH (ref 70–99)
Glucose-Capillary: 173 mg/dL — ABNORMAL HIGH (ref 70–99)
Glucose-Capillary: 220 mg/dL — ABNORMAL HIGH (ref 70–99)

## 2020-05-27 MED ORDER — THIAMINE HCL 100 MG PO TABS
100.0000 mg | ORAL_TABLET | Freq: Every day | ORAL | Status: DC
  Administered 2020-05-27 – 2020-06-15 (×20): 100 mg via ORAL
  Filled 2020-05-27 (×20): qty 1

## 2020-05-27 NOTE — Progress Notes (Addendum)
Report called to [place] on 1C. Patient daughter [name] was notified of patient's transfer [phone].

## 2020-05-27 NOTE — Progress Notes (Signed)
PROGRESS NOTE    Lance [name]  YKD:[id number] DOB: [birth year] DOA: 05/14/2020 PCP: Theotis Burrow, MD    Brief Narrative:  74 y.o.malewith medical history significant forhepatitis C was treated with Harvoni, insulin-dependent diabetes mellitus presented to the emergency department for chief concerns of being found unresponsive/altered mentation by his neighbors or friends.  MRI followed with further work-up.  Demonstrates findings consistent with amyloid angiopathy.  Patient does admit to cocaine use which he was strongly advised against given the high risk of intracranial hemorrhage in the setting of amyloid angiopathy  Psychiatry was consulted due to subacute delirium but felt he did not meet criteria for inpatient psychiatric placement.  Patient also had a meningioma seen on imaging.  Neurology and neurosurgery were on board however both are signed off.  Recommend outpatient follow-up with neurosurgery for meningioma and to continue Depakote for antiepileptic therapy  Patient been evaluated by therapy.  Have recommended SNF.  As of 05/24/2020 no bed offers have been made.  Per case management if we are unable to find a skilled nursing facility that accepts patient will need to pursue home with home health.  12/18: Informed by weekend case manager Raina Mina that patient's daughter had accepted a offered SNF bed and that insurance authorization is in progress.   Assessment & Plan:   Principal Problem:   Subacute delirium Active Problems:   Sepsis ([place])   Syncope   Malnutrition of moderate degree  Acute toxic metabolic encephalopathy Unclear etiology Patient has wide swings and behavior between cooperative and lucid to combative and confused Concern for possible seizure activity Also encephalopathy delirium secondary to substance abuse Per neurology AMS secondary to meningioma and slight vasogenic edema 12/16: Patient was alert awake and give appropriate  responses to my interview questions this morning.  Mental status appears to be substantially improved from prior.  No recent reports of combative or aggressive behavior.  Patient has been adherent and compliant with recommendations of medical care. 12/17: Mental status improving daily.  Patient alert and communicative.  Pleasant, no distress.  Appropriate responses to all questions asked. Plan: Delirium precautions Frequent reorienting measures Avoid sedatives Medically stable for discharge to skilled nursing facility once insurance authorization is obtained  Sepsis felt unlikely, ruled out Procalcitonin negative.  No indication for antibiotics  Insulin-dependent diabetes mellitus Basal bolus regimen Last A1c 10.8, poor control  Substance abuse Patient counseled on cocaine cessation  Functional decline PT OT recommending SNF, patient and family have accepted a bed offer  HIV Continue ART regimen      DVT prophylaxis: Lovenox Code Status: Full Family Communication: Daughter Margaretmary Lombard [phone] on 12/17 Disposition Plan: Status is: Inpatient  Remains inpatient appropriate because:Unsafe d/c plan   Dispo: The patient is from: Home              Anticipated d/c is to: SNF              Anticipated d/c date is: 1 day              Patient currently is medically stable to d/c.   Patient is medically stable for discharge.  He has not demonstrated any aggressive or combative behavior.  He has been adherent to all medical and nursing care.  Per case management patient's daughter has accepted a bed offer.  Insurance authorization in progress.  Patient can discharge to skilled nursing facility once authorization is complete.  Consultants:   Neurology  Psychiatry  Procedures:   None  Antimicrobials:   None   Subjective: Patient seen and examined.  Pleasant cooperative.  No pain complaints.  Objective: Vitals:   05/26/20 2030 05/27/20 0322 05/27/20 0822 05/27/20  1200  BP: (!) 143/65 139/85 (!) 110/94 127/82  Pulse: 79 74 78 78  Resp: 19 16 18 18   Temp: (!) 97.4 F (36.3 C) 97.6 F (36.4 C) 98.2 F (36.8 C) 97.9 F (36.6 C)  TempSrc: Oral Oral Oral   SpO2: 97% 100% 98% 97%  Weight:  77.5 kg    Height:        Intake/Output Summary (Last 24 hours) at 05/27/2020 1328 Last data filed at 05/27/2020 1323 Gross per 24 hour  Intake 1077 ml  Output 1900 ml  Net -823 ml   Filed Weights   05/24/20 0446 05/26/20 0342 05/27/20 0322  Weight: 74.1 kg 74.7 kg 77.5 kg    Examination:  General exam: No acute distress.  Appears chronically ill Respiratory system: Bibasilar crackles.  Normal work of breathing.  Room air Cardiovascular system: S1 & S2 heard, RRR. No JVD, murmurs, rubs, gallops or clicks. No pedal edema. Gastrointestinal system: Abdomen is nondistended, soft and nontender. No organomegaly or masses felt. Normal bowel sounds heard. Central nervous system: Alert, oriented x2, no focal deficits Extremities: Symmetric 5 x 5 power. Skin: No rashes, lesions or ulcers Psychiatry: Judgement and insight appear normal. Mood & affect appropriate.     Data Reviewed: I have personally reviewed following labs and imaging studies  CBC: Recent Labs  Lab 05/24/20 0425 05/26/20 1005  WBC 9.9 11.4*  NEUTROABS 7.2 8.4*  HGB 12.6* 12.7*  HCT 38.5* 40.1  MCV 92.1 93.3  PLT 246 182   Basic Metabolic Panel: Recent Labs  Lab 05/21/20 0336 05/24/20 0425 05/26/20 1005  NA 139 145 141  K 4.3 4.2 4.0  CL 101 104 100  CO2 29 28 29   GLUCOSE 109* 121* 180*  BUN 16 22 27*  CREATININE 0.97 1.02 1.24  CALCIUM 9.2 9.4 8.9   GFR: Estimated Creatinine Clearance: 53.8 mL/min (by C-G formula based on SCr of 1.24 mg/dL). Liver Function Tests: Recent Labs  Lab 05/24/20 0425  AST 19  ALT 18  ALKPHOS 75  BILITOT 1.0  PROT 8.4*  ALBUMIN 3.3*   No results for input(s): LIPASE, AMYLASE in the last 168 hours. No results for input(s): AMMONIA in  the last 168 hours. Coagulation Profile: No results for input(s): INR, PROTIME in the last 168 hours. Cardiac Enzymes: No results for input(s): CKTOTAL, CKMB, CKMBINDEX, TROPONINI in the last 168 hours. BNP (last 3 results) No results for input(s): PROBNP in the last 8760 hours. HbA1C: No results for input(s): HGBA1C in the last 72 hours. CBG: Recent Labs  Lab 05/26/20 1127 05/26/20 1635 05/26/20 2031 05/27/20 0824 05/27/20 1201  GLUCAP 174* 186* 159* 120* 192*   Lipid Profile: No results for input(s): CHOL, HDL, LDLCALC, TRIG, CHOLHDL, LDLDIRECT in the last 72 hours. Thyroid Function Tests: No results for input(s): TSH, T4TOTAL, FREET4, T3FREE, THYROIDAB in the last 72 hours. Anemia Panel: No results for input(s): VITAMINB12, FOLATE, FERRITIN, TIBC, IRON, RETICCTPCT in the last 72 hours. Sepsis Labs: No results for input(s): PROCALCITON, LATICACIDVEN in the last 168 hours.  No results found for this or any previous visit (from the past 240 hour(s)).       Radiology Studies: DG Abd 1 View  Result Date: 05/26/2020 CLINICAL DATA:  Constipation EXAM: ABDOMEN - 1 VIEW COMPARISON:  05/15/2020 FINDINGS: Embolization coils within  the upper abdomen. The bowel gas pattern is normal. Moderate volume of stool projects throughout the colon. No radio-opaque calculi or other significant radiographic abnormality are seen. Degenerative changes of the lumbar spine and bilateral hips. IMPRESSION: 1. Nonobstructive bowel gas pattern. 2. Moderate colonic stool burden. Electronically Signed   By: Davina Poke D.O.   On: 05/26/2020 16:49        Scheduled Meds: . buprenorphine-naloxone  1 tablet Sublingual Daily  . Chlorhexidine Gluconate Cloth  6 each Topical Daily  . citalopram  10 mg Oral Daily  . enoxaparin (LOVENOX) injection  40 mg Subcutaneous Q24H  . feeding supplement  237 mL Oral TID BM  . ferrous sulfate  325 mg Oral Q breakfast  . insulin aspart  0-15 Units Subcutaneous  TID WC  . insulin aspart  0-5 Units Subcutaneous QHS  . insulin detemir  10 Units Subcutaneous Q2200  . Ledipasvir-Sofosbuvir  1 tablet Oral Daily  . multivitamin with minerals  1 tablet Oral Daily  . tamsulosin  0.8 mg Oral Daily  . thiamine  100 mg Oral Daily  . vitamin B-12  1,000 mcg Oral Daily   Continuous Infusions:   LOS: 12 days    Time spent: 15 minutes    Sidney Ace, MD [facility] Pager [phone]  If 7PM-7AM, please contact night-coverage 05/27/2020, 1:28 PM

## 2020-05-27 NOTE — TOC Progression Note (Signed)
Transition of Care [facility]) - Progression Note    Patient Details  Name: Lance [name] MRN: [id number] Date of Birth: 1945/07/08  [facility]) CM/SW Contact  Zigmund Daniel Dorian Pod, RN Phone Number: 05/27/2020, 2:36 PM  Clinical Narrative:    Initially spoke with [name]) this morning and again this afternoon who indicated awaiting insurance approval prior to pt admitting into the facility which will probably be on Monday. Daughter [name]) updated accordingly along with the provider. No other issues to address at this time.  TOC team will continue to follow.   Expected Discharge Plan: [place] Barriers to Discharge: Continued Medical Work up  Expected Discharge Plan and Services Expected Discharge Plan: [place] In-house Referral: Clinical Social Work   Post Acute Care Choice: [place] Living arrangements for the past 2 months: Single Family Home                                       Social Determinants of Health (SDOH) Interventions    Readmission Risk Interventions No flowsheet data found.

## 2020-05-28 LAB — GLUCOSE, CAPILLARY
Glucose-Capillary: 128 mg/dL — ABNORMAL HIGH (ref 70–99)
Glucose-Capillary: 214 mg/dL — ABNORMAL HIGH (ref 70–99)
Glucose-Capillary: 210 mg/dL — ABNORMAL HIGH (ref 70–99)
Glucose-Capillary: 158 mg/dL — ABNORMAL HIGH (ref 70–99)

## 2020-05-28 MED ORDER — POLYETHYLENE GLYCOL 3350 17 G PO PACK
34.0000 g | PACK | ORAL | Status: AC
  Administered 2020-05-28 (×5): 34 g via ORAL
  Filled 2020-05-28 (×5): qty 2

## 2020-05-28 NOTE — Progress Notes (Signed)
PROGRESS NOTE    Lance [name]  NLZ:[id number] DOB: 1945-09-20 DOA: 05/14/2020 PCP: Lance Burrow, MD    Brief Narrative:  74 y.o.malewith medical history significant forhepatitis C was treated with Harvoni, insulin-dependent diabetes mellitus presented to the emergency department for chief concerns of being found unresponsive/altered mentation by his neighbors or friends.  MRI followed with further work-up.  Demonstrates findings consistent with amyloid angiopathy.  Patient does admit to cocaine use which he was strongly advised against given the high risk of intracranial hemorrhage in the setting of amyloid angiopathy  Psychiatry was consulted due to subacute delirium but felt he did not meet criteria for inpatient psychiatric placement.  Patient also had a meningioma seen on imaging.  Neurology and neurosurgery were on board however both are signed off.  Recommend outpatient follow-up with neurosurgery for meningioma and to continue Depakote for antiepileptic therapy  Patient been evaluated by therapy.  Have recommended SNF.  As of 05/24/2020 no bed offers have been made.  Per case management if we are unable to find a skilled nursing facility that accepts patient will need to pursue home with home health.  12/18: Informed by weekend case manager Lance [name] that patient's daughter had accepted a offered SNF bed and that insurance authorization is in progress.   Assessment & Plan:   Principal Problem:   Subacute delirium Active Problems:   Sepsis ([place])   Syncope   Malnutrition of moderate degree  Acute toxic metabolic encephalopathy Chronic innumerable microhemorrhages concerning for amyloid angiopathy Patient has wide swings and behavior between cooperative and lucid to combative and confused --Routine EEG with no seizures or epileptiform discharges. --likely encephalopathy delirium secondary to substance abuse Per neurology AMS secondary to meningioma and  slight vasogenic edema 12/16: Patient was alert awake and give appropriate responses to my interview questions this morning.  Mental status appears to be substantially improved from prior.  No recent reports of combative or aggressive behavior.  Patient has been adherent and compliant with recommendations of medical care. 12/17: Mental status improving daily.  Patient alert and communicative.  Pleasant, no distress.  Appropriate responses to all questions asked. Plan: --avoid sedatives --d/c Suboxone today (not sure why pt was ordered this, since pt does not have Rx for this or any other opioids currently). --Delirium precautions --Follow up with outpatient neurologist in 6-8 weeks for concern for underlying cognitive dysfunction.  Sepsis, ruled out Procalcitonin negative.  No indication for antibiotics  Insulin-dependent diabetes mellitus, poorly controlled Last A1c 10.8, poor control --cont Levemir 10u nightly --SSI  Substance abuse Patient counseled on cocaine cessation.  With his amyloid angiopathy, he is VERY HIGH RISK of ICH with cocaine use.  Functional decline PT OT recommending SNF, patient and family have accepted a bed offer  HIV Cont HIV meds  Constipation --high-dose Miralax 34 g q2h for 5 doses   DVT prophylaxis: Lovenox Code Status: Full Family Communication:  Disposition Plan: Status is: Inpatient   Dispo: The patient is from: Home              Anticipated d/c is to: SNF              Anticipated d/c date is: whenever bed available.              Patient currently is medically stable to d/c.   Patient is medically stable for discharge.  He has not demonstrated any aggressive or combative behavior.  He has been adherent to all medical and nursing  care.  Per case management patient's daughter has accepted a bed offer.  Insurance authorization in progress.  Patient can discharge to skilled nursing facility once authorization is complete.  Consultants:    Neurology  Psychiatry  Procedures:   None  Antimicrobials:   None   Subjective: Pt denied pain.  No N/V/D.  Eating well.  No BM charted in recent hx.     Objective: Vitals:   05/28/20 0400 05/28/20 0735 05/28/20 1156 05/28/20 1600  BP: 130/84 111/65 138/71 121/82  Pulse: 72 70 84 76  Resp: 20 18 18 18   Temp: 97.8 F (36.6 C) 98.6 F (37 C) 97.7 F (36.5 C) 98.5 F (36.9 C)  TempSrc: Oral Oral    SpO2: 100% 96% 100% 100%  Weight:      Height:        Intake/Output Summary (Last 24 hours) at 05/28/2020 1729 Last data filed at 05/28/2020 1500 Gross per 24 hour  Intake 240 ml  Output 1000 ml  Net -760 ml   Filed Weights   05/24/20 0446 05/26/20 0342 05/27/20 0322  Weight: 74.1 kg 74.7 kg 77.5 kg    Examination:  Constitutional: NAD, alert, cooperative, but confused and perseverates certain thoughts HEENT: conjunctivae and lids normal, EOMI CV: No cyanosis.   RESP: normal respiratory effort, on RA Extremities: No effusions, edema in BLE SKIN: warm, dry and intact Neuro: II - XII grossly intact.   Psych: Normal mood and affect.     Data Reviewed: I have personally reviewed following labs and imaging studies  CBC: Recent Labs  Lab 05/24/20 0425 05/26/20 1005  WBC 9.9 11.4*  NEUTROABS 7.2 8.4*  HGB 12.6* 12.7*  HCT 38.5* 40.1  MCV 92.1 93.3  PLT 246 998   Basic Metabolic Panel: Recent Labs  Lab 05/24/20 0425 05/26/20 1005  NA 145 141  K 4.2 4.0  CL 104 100  CO2 28 29  GLUCOSE 121* 180*  BUN 22 27*  CREATININE 1.02 1.24  CALCIUM 9.4 8.9   GFR: Estimated Creatinine Clearance: 53.8 mL/min (by C-G formula based on SCr of 1.24 mg/dL). Liver Function Tests: Recent Labs  Lab 05/24/20 0425  AST 19  ALT 18  ALKPHOS 75  BILITOT 1.0  PROT 8.4*  ALBUMIN 3.3*   No results for input(s): LIPASE, AMYLASE in the last 168 hours. No results for input(s): AMMONIA in the last 168 hours. Coagulation Profile: No results for input(s): INR,  PROTIME in the last 168 hours. Cardiac Enzymes: No results for input(s): CKTOTAL, CKMB, CKMBINDEX, TROPONINI in the last 168 hours. BNP (last 3 results) No results for input(s): PROBNP in the last 8760 hours. HbA1C: No results for input(s): HGBA1C in the last 72 hours. CBG: Recent Labs  Lab 05/27/20 1728 05/27/20 2050 05/28/20 0734 05/28/20 1155 05/28/20 1639  GLUCAP 173* 220* 128* 214* 210*   Lipid Profile: No results for input(s): CHOL, HDL, LDLCALC, TRIG, CHOLHDL, LDLDIRECT in the last 72 hours. Thyroid Function Tests: No results for input(s): TSH, T4TOTAL, FREET4, T3FREE, THYROIDAB in the last 72 hours. Anemia Panel: No results for input(s): VITAMINB12, FOLATE, FERRITIN, TIBC, IRON, RETICCTPCT in the last 72 hours. Sepsis Labs: No results for input(s): PROCALCITON, LATICACIDVEN in the last 168 hours.  No results found for this or any previous visit (from the past 240 hour(s)).       Radiology Studies: No results found.      Scheduled Meds: . Chlorhexidine Gluconate Cloth  6 each Topical Daily  . citalopram  10 mg Oral Daily  . enoxaparin (LOVENOX) injection  40 mg Subcutaneous Q24H  . feeding supplement  237 mL Oral TID BM  . ferrous sulfate  325 mg Oral Q breakfast  . insulin aspart  0-15 Units Subcutaneous TID WC  . insulin aspart  0-5 Units Subcutaneous QHS  . insulin detemir  10 Units Subcutaneous Q2200  . Ledipasvir-Sofosbuvir  1 tablet Oral Daily  . multivitamin with minerals  1 tablet Oral Daily  . polyethylene glycol  34 g Oral Q2H  . tamsulosin  0.8 mg Oral Daily  . thiamine  100 mg Oral Daily  . vitamin B-12  1,000 mcg Oral Daily   Continuous Infusions:   LOS: 13 days     Enzo Bi, MD [facility] Pager [phone]  If 7PM-7AM, please contact night-coverage 05/28/2020, 5:29 PM

## 2020-05-29 ENCOUNTER — Other Ambulatory Visit: Payer: Self-pay

## 2020-05-29 LAB — CBC
HCT: 33.8 % — ABNORMAL LOW (ref 39.0–52.0)
Hemoglobin: 11.3 g/dL — ABNORMAL LOW (ref 13.0–17.0)
MCH: 30.9 pg (ref 26.0–34.0)
MCHC: 33.4 g/dL (ref 30.0–36.0)
MCV: 92.3 fL (ref 80.0–100.0)
Platelets: 236 10*3/uL (ref 150–400)
RBC: 3.66 MIL/uL — ABNORMAL LOW (ref 4.22–5.81)
RDW: 11.8 % (ref 11.5–15.5)
WBC: 12.6 10*3/uL — ABNORMAL HIGH (ref 4.0–10.5)
nRBC: 0 % (ref 0.0–0.2)

## 2020-05-29 LAB — GLUCOSE, CAPILLARY
Glucose-Capillary: 107 mg/dL — ABNORMAL HIGH (ref 70–99)
Glucose-Capillary: 216 mg/dL — ABNORMAL HIGH (ref 70–99)
Glucose-Capillary: 214 mg/dL — ABNORMAL HIGH (ref 70–99)
Glucose-Capillary: 83 mg/dL (ref 70–99)

## 2020-05-29 LAB — BASIC METABOLIC PANEL
Anion gap: 10 (ref 5–15)
BUN: 19 mg/dL (ref 8–23)
CO2: 28 mmol/L (ref 22–32)
Calcium: 8.8 mg/dL — ABNORMAL LOW (ref 8.9–10.3)
Chloride: 102 mmol/L (ref 98–111)
Creatinine, Ser: 0.97 mg/dL (ref 0.61–1.24)
GFR, Estimated: >60 mL/min (ref >60)
Glucose, Bld: 165 mg/dL — ABNORMAL HIGH (ref 70–99)
Potassium: 4.3 mmol/L (ref 3.5–5.1)
Sodium: 140 mmol/L (ref 135–145)

## 2020-05-29 LAB — MAGNESIUM: Magnesium: 2.3 mg/dL (ref 1.7–2.4)

## 2020-05-29 MED ORDER — POLYETHYLENE GLYCOL 3350 17 G PO PACK
34.0000 g | PACK | ORAL | Status: AC
  Administered 2020-05-29 – 2020-05-30 (×3): 34 g via ORAL
  Filled 2020-05-29: qty 2

## 2020-05-29 MED ORDER — MAGNESIUM CITRATE PO SOLN
1.0000 | Freq: Once | ORAL | Status: AC
  Administered 2020-05-29: 19:00:00 1 via ORAL
  Filled 2020-05-29: qty 296

## 2020-05-29 NOTE — Progress Notes (Signed)
PROGRESS NOTE    Lance [name]  PRF:[id number] DOB: 1946-02-13 DOA: 05/14/2020 PCP: Theotis Burrow, MD    Brief Narrative:  74 y.o.malewith medical history significant forhepatitis C was treated with Harvoni, insulin-dependent diabetes mellitus presented to the emergency department for chief concerns of being found unresponsive/altered mentation by his neighbors or friends.  MRI followed with further work-up.  Demonstrates findings consistent with amyloid angiopathy.  Patient does admit to cocaine use which he was strongly advised against given the high risk of intracranial hemorrhage in the setting of amyloid angiopathy  Psychiatry was consulted due to subacute delirium but felt he did not meet criteria for inpatient psychiatric placement.  Patient also had a meningioma seen on imaging.  Neurology and neurosurgery were on board however both are signed off.  Recommend outpatient follow-up with neurosurgery for meningioma and to continue Depakote for antiepileptic therapy  Patient been evaluated by therapy.  Have recommended SNF.  As of 05/24/2020 no bed offers have been made.  Per case management if we are unable to find a skilled nursing facility that accepts patient will need to pursue home with home health.  12/18: Informed by weekend case manager Raina Mina that patient's daughter had accepted a offered SNF bed and that insurance authorization is in progress.   Assessment & Plan:   Principal Problem:   Subacute delirium Active Problems:   Sepsis ([place])   Syncope   Malnutrition of moderate degree  Acute toxic metabolic encephalopathy Chronic innumerable microhemorrhages concerning for amyloid angiopathy Patient has wide swings and behavior between cooperative and lucid to combative and confused --Routine EEG with no seizures or epileptiform discharges. --likely encephalopathy delirium secondary to substance abuse Per neurology AMS secondary to meningioma and  slight vasogenic edema 12/16: Patient was alert awake and give appropriate responses to my interview questions this morning.  Mental status appears to be substantially improved from prior.  No recent reports of combative or aggressive behavior.  Patient has been adherent and compliant with recommendations of medical care. 12/17: Mental status improving daily.  Patient alert and communicative.  Pleasant, no distress.  Appropriate responses to all questions asked. --Suboxone d/c'ed on 12/19 (not sure why pt was ordered this on admission, since pt does not have Rx for this or any other opioids currently). Plan: --avoid sedatives --Delirium precautions --Follow up with outpatient neurologist in 6-8 weeks for concern for underlying cognitive dysfunction.  Sepsis, ruled out Procalcitonin negative.  No indication for antibiotics  Insulin-dependent diabetes mellitus, poorly controlled Last A1c 10.8, poor control --cont Levemir 10u nightly --SSI  Substance abuse Patient counseled on cocaine cessation.  With his amyloid angiopathy, he is VERY HIGH RISK of ICH with cocaine use.  Functional decline PT OT recommending SNF, patient and family have accepted a bed offer  HIV Cont HIV meds  Constipation --high-dose Miralax 34 g q2h for 5 doses on 12/19 did not produce a BM. --repeat above today --Mag citrate x1 today   DVT prophylaxis: Lovenox Code Status: Full Family Communication:  Disposition Plan: Status is: Inpatient   Dispo: The patient is from: Home              Anticipated d/c is to: SNF              Anticipated d/c date is: whenever bed available.              Patient currently is medically stable to d/c.   Patient is medically stable for discharge.  He has  not demonstrated any aggressive or combative behavior.  He has been adherent to all medical and nursing care.  Per case management patient's daughter has accepted a bed offer.  Insurance authorization in progress.  Patient can  discharge to skilled nursing facility once authorization is complete.  Consultants:   Neurology  Psychiatry  Procedures:   None  Antimicrobials:   None   Subjective: After Miralax 34g x5 doses yesterday, pt still hadn't had a BM.  Reported mild pain over LLQ.  Still eating well.  No N/V.     Objective: Vitals:   05/29/20 0451 05/29/20 0801 05/29/20 1225 05/29/20 1555  BP: 119/64 135/88 111/73 (!) 141/78  Pulse: 73 72 79 81  Resp: 16 18 16 16   Temp: 98.3 F (36.8 C) 98.4 F (36.9 C) 98 F (36.7 C) 98.4 F (36.9 C)  TempSrc: Axillary Oral  Oral  SpO2: 92% 99% 100% 99%  Weight:      Height:        Intake/Output Summary (Last 24 hours) at 05/29/2020 1701 Last data filed at 05/29/2020 1100 Gross per 24 hour  Intake --  Output 1245 ml  Net -1245 ml   Filed Weights   05/24/20 0446 05/26/20 0342 05/27/20 0322  Weight: 74.1 kg 74.7 kg 77.5 kg    Examination:  Constitutional: NAD, alert, cooperative, no insight HEENT: conjunctivae and lids normal, EOMI CV: No cyanosis.   RESP: normal respiratory effort, on RA GI: Abdomen somewhat firm Extremities: No effusions, edema in BLE SKIN: warm, dry and intact Neuro: II - XII grossly intact.   Psych: Normal mood and affect.      Data Reviewed: I have personally reviewed following labs and imaging studies  CBC: Recent Labs  Lab 05/24/20 0425 05/26/20 1005 05/29/20 0313  WBC 9.9 11.4* 12.6*  NEUTROABS 7.2 8.4*  --   HGB 12.6* 12.7* 11.3*  HCT 38.5* 40.1 33.8*  MCV 92.1 93.3 92.3  PLT 246 293 573   Basic Metabolic Panel: Recent Labs  Lab 05/24/20 0425 05/26/20 1005 05/29/20 0313  NA 145 141 140  K 4.2 4.0 4.3  CL 104 100 102  CO2 28 29 28   GLUCOSE 121* 180* 165*  BUN 22 27* 19  CREATININE 1.02 1.24 0.97  CALCIUM 9.4 8.9 8.8*  MG  --   --  2.3   GFR: Estimated Creatinine Clearance: 68.8 mL/min (by C-G formula based on SCr of 0.97 mg/dL). Liver Function Tests: Recent Labs  Lab 05/24/20 0425   AST 19  ALT 18  ALKPHOS 75  BILITOT 1.0  PROT 8.4*  ALBUMIN 3.3*   No results for input(s): LIPASE, AMYLASE in the last 168 hours. No results for input(s): AMMONIA in the last 168 hours. Coagulation Profile: No results for input(s): INR, PROTIME in the last 168 hours. Cardiac Enzymes: No results for input(s): CKTOTAL, CKMB, CKMBINDEX, TROPONINI in the last 168 hours. BNP (last 3 results) No results for input(s): PROBNP in the last 8760 hours. HbA1C: No results for input(s): HGBA1C in the last 72 hours. CBG: Recent Labs  Lab 05/28/20 1639 05/28/20 2046 05/29/20 0803 05/29/20 1223 05/29/20 1558  GLUCAP 210* 158* 107* 216* 214*   Lipid Profile: No results for input(s): CHOL, HDL, LDLCALC, TRIG, CHOLHDL, LDLDIRECT in the last 72 hours. Thyroid Function Tests: No results for input(s): TSH, T4TOTAL, FREET4, T3FREE, THYROIDAB in the last 72 hours. Anemia Panel: No results for input(s): VITAMINB12, FOLATE, FERRITIN, TIBC, IRON, RETICCTPCT in the last 72 hours. Sepsis Labs: No  results for input(s): PROCALCITON, LATICACIDVEN in the last 168 hours.  No results found for this or any previous visit (from the past 240 hour(s)).       Radiology Studies: No results found.      Scheduled Meds: . Chlorhexidine Gluconate Cloth  6 each Topical Daily  . citalopram  10 mg Oral Daily  . enoxaparin (LOVENOX) injection  40 mg Subcutaneous Q24H  . feeding supplement  237 mL Oral TID BM  . ferrous sulfate  325 mg Oral Q breakfast  . insulin aspart  0-15 Units Subcutaneous TID WC  . insulin aspart  0-5 Units Subcutaneous QHS  . insulin detemir  10 Units Subcutaneous Q2200  . Ledipasvir-Sofosbuvir  1 tablet Oral Daily  . multivitamin with minerals  1 tablet Oral Daily  . polyethylene glycol  34 g Oral Q2H  . tamsulosin  0.8 mg Oral Daily  . thiamine  100 mg Oral Daily  . vitamin B-12  1,000 mcg Oral Daily   Continuous Infusions:   LOS: 14 days     Lance Bi, MD [facility] Pager [phone]  If 7PM-7AM, please contact night-coverage 05/29/2020, 5:01 PM

## 2020-05-29 NOTE — Care Management Important Message (Signed)
Important Message  Patient Details  Name: Lance [name] MRN: [id number] Date of Birth: 1946/02/09   Medicare Important Message Given:  Yes     Juliann Pulse A Deldrick Linch 05/29/2020, 11:20 AM

## 2020-05-29 NOTE — TOC Progression Note (Signed)
Transition of Care [facility]) - Progression Note    Patient Details  Name: Lance [name] MRN: [id number] Date of Birth: 1946/02/21  Transition of Care [facility]) CM/SW [place], LCSW Phone Number: 05/29/2020, 11:04 AM  Clinical Narrative:   CSW called Otila Kluver at [facility]. She reported she does not have insurance authorization for this patient yet. Provided CSW's direct #, asked Otila Kluver to call when she gets [place].    Expected Discharge Plan: [place] Barriers to Discharge: Continued Medical Work up  Expected Discharge Plan and Services Expected Discharge Plan: [place] In-house Referral: Clinical Social Work   Post Acute Care Choice: [place] Living arrangements for the past 2 months: Single Family Home                                       Social Determinants of Health (SDOH) Interventions    Readmission Risk Interventions No flowsheet data found.

## 2020-05-29 NOTE — Progress Notes (Signed)
Physical Therapy Treatment Patient Details Name: Lance [name] MRN: [id number] DOB: 01-20-1946 Today's Date: 05/29/2020    History of Present Illness Pt is a 74yo M admitted to [facility] on 05/14/20 because of concerns for AMS; friends found pt unresponsive on the ground and called EMS. Per EMS, CBG in 500's. Blood work concerning for leukocytosis and lactic acidosis. 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 PMH significant includes: T2DM, Hep C, schizophrenia, and sepsis.    PT Comments    Pt ready for session after extended time to eat.  He agrees to session.  Pt with soiled hand from lunch as he was eating with his fingers.  Given wash cloth and he washes hands for an extended period of time and needs cloth removed from his hands to stop.  Unable to follow cues for exercises.  Transitioned to EOB with increased time and verbal and tactile cues.  He did try to move closer to EOB by scooting backwards and needs cues to stop so he does not slide backwards off bed.  Once sitting his sitting balance is good and able to reach outside BOS and recover without difficulty.  He is not orientated to time/place but clearly states his name.  When given 3 choices - home, store, hospital he is unable to tell where he is.  Perseverates on words as well as tasks.  He is able to stand with min a x 1 to walker.  He is unable to follow cues to march in place but is able to sidestep with mod a x 1 with poor balance.  Unsafe to progress gait away from bed.    Primary barrier is cognition vs physical abilities today.   Follow Up Recommendations  SNF;Supervision/Assistance - 24 hour;Supervision for mobility/OOB     Equipment Recommendations  None recommended by PT    Recommendations for Other Services       Precautions / Restrictions Precautions Precautions:  Fall Restrictions Weight Bearing Restrictions: No    Mobility  Bed Mobility Overal bed mobility: Needs Assistance Bed Mobility: Supine to Sit;Sit to Supine     Supine to sit: Min guard Sit to supine: Min guard      Transfers Overall transfer level: Needs assistance Equipment used: Rolling walker (2 wheeled) Transfers: Sit to/from Stand Sit to Stand: Min assist;From elevated surface            Ambulation/Gait Ambulation/Gait assistance: Herbalist (Feet): 3 Feet Assistive device: Rolling walker (2 wheeled) Gait Pattern/deviations: Step-to pattern;Narrow base of support     General Gait Details: sidesteps along bed with heavy cues   Stairs             Wheelchair Mobility    Modified Rankin (Stroke Patients Only)       Balance Overall balance assessment: Needs assistance Sitting-balance support: Feet supported;No upper extremity supported Sitting balance-Leahy Scale: Good     Standing balance support: During functional activity;Bilateral upper extremity supported Standing balance-Leahy Scale: Poor Standing balance comment: post lean                            Cognition Arousal/Alertness: Awake/alert Behavior During Therapy: Flat affect Overall Cognitive Status: Impaired/Different from baseline  General Comments: verys low to respond, not oriented to time place, perserverates on tasks/words      Exercises Other Exercises Other Exercises: unable to follow cues for ex    General Comments        Pertinent Vitals/Pain Pain Assessment: No/denies pain    Home Living                      Prior Function            PT Goals (current goals can now be found in the care plan section) Progress towards PT goals: Progressing toward goals    Frequency           PT Plan Current plan remains appropriate    Co-evaluation              AM-PAC PT "6  Clicks" Mobility   Outcome Measure  Help needed turning from your back to your side while in a flat bed without using bedrails?: A Little Help needed moving from lying on your back to sitting on the side of a flat bed without using bedrails?: A Little Help needed moving to and from a bed to a chair (including a wheelchair)?: A Lot Help needed standing up from a chair using your arms (e.g., wheelchair or bedside chair)?: A Lot Help needed to walk in hospital room?: A Lot Help needed climbing 3-5 steps with a railing? : A Lot 6 Click Score: 14    End of Session Equipment Utilized During Treatment: Gait belt Activity Tolerance: Patient tolerated treatment well;Other (comment) Patient left: in bed;with call bell/phone within reach;with bed alarm set Nurse Communication: Mobility status       Time: 2761-4709 PT Time Calculation (min) (ACUTE ONLY): 23 min  Charges:  $Therapeutic Activity: 23-37 mins                    Chesley Noon, PTA 05/29/20, 3:51 PM

## 2020-05-30 LAB — GLUCOSE, CAPILLARY
Glucose-Capillary: 137 mg/dL — ABNORMAL HIGH (ref 70–99)
Glucose-Capillary: 118 mg/dL — ABNORMAL HIGH (ref 70–99)
Glucose-Capillary: 250 mg/dL — ABNORMAL HIGH (ref 70–99)
Glucose-Capillary: 217 mg/dL — ABNORMAL HIGH (ref 70–99)

## 2020-05-30 MED ORDER — POLYETHYLENE GLYCOL 3350 17 G PO PACK
17.0000 g | PACK | Freq: Every day | ORAL | Status: DC
  Administered 2020-05-30 – 2020-06-12 (×14): 17 g via ORAL
  Filled 2020-05-30 (×17): qty 1

## 2020-05-30 NOTE — TOC Progression Note (Addendum)
Transition of Care [facility]) - Progression Note    Patient Details  Name: Lance [name] MRN: [id number] Date of Birth: Oct 21, 1945  Transition of Care [facility]) CM/SW [place], LCSW Phone Number: 05/30/2020, 10:46 AM  Clinical Narrative:   Attempted call to Otila Kluver at [facility] to inquire if they have insurance authorization, no answer and voicemail full.   11:35- Ronni Rumble at [facility] again. She reported she does not have authorization yet, she said she will call [employer] on the status and let CSW know what she finds out.    Expected Discharge Plan: [place] Barriers to Discharge: Continued Medical Work up  Expected Discharge Plan and Services Expected Discharge Plan: [place] In-house Referral: Clinical Social Work   Post Acute Care Choice: [place] Living arrangements for the past 2 months: Single Family Home                                       Social Determinants of Health (SDOH) Interventions    Readmission Risk Interventions No flowsheet data found.

## 2020-05-30 NOTE — Progress Notes (Signed)
Occupational Therapy Treatment Patient Details Name: Lance [name] MRN: [id number] DOB: 1945-09-20 Today's Date: 05/30/2020    History of present illness Pt is a 74yo M admitted to [facility] on 05/14/20 because of concerns for AMS; friends found pt unresponsive on the ground and called EMS. Per EMS, CBG in 500's. Blood work concerning for leukocytosis and lactic acidosis. 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 PMH significant includes: T2DM, Hep C, schizophrenia, and sepsis.   OT comments  Upon entering the room, pt supine in bed and sleeping soundly. Pt is agreeable to OT intervention. Pt continues to be oriented to self only and verbalizes he knows where he is but when given choice of 2 was incorrect with each answer for orientation. Pt pleasant and cooperative during session with min cuing needed for initiation and sequencing. Pt performed supine >sit with min A and remained on EOB to wash face and brush teeth with set up A and mod multimodal cuing for tasks. Pt attempting to stand from EOB several times to walk to bathroom to void but OT reminded pt he had foley cath placed. Pt then began pulling on foley tubing and needing to be redirected for safety. Pt very fatigued and requesting to return to bed. Sit >supine with assistance for B LEs. Pt asleep as therapist exits the room. Bed alarm activated for safety. Pt continued to benefit from OT intervention.   Follow Up Recommendations  SNF;Supervision/Assistance - 24 hour    Equipment Recommendations  Other (comment) (defer to next venue of care)       Precautions / Restrictions Precautions Precautions: Fall Restrictions Weight Bearing Restrictions: No       Mobility Bed Mobility Overal bed mobility: Needs Assistance Bed Mobility: Supine to Sit;Sit to Supine Rolling: Supervision   Supine to sit:  Min guard Sit to supine: Min guard   General bed mobility comments: min cuing for technique  Transfers Overall transfer level: Needs assistance Equipment used: Rolling walker (2 wheeled) Transfers: Sit to/from Stand Sit to Stand: Min assist;From elevated surface              Balance Overall balance assessment: Needs assistance Sitting-balance support: Feet supported;No upper extremity supported Sitting balance-Leahy Scale: Good     Standing balance support: During functional activity;Bilateral upper extremity supported Standing balance-Leahy Scale: Poor Standing balance comment: posterior lean                           ADL either performed or assessed with clinical judgement   ADL Overall ADL's : Needs assistance/impaired     Grooming: Wash/dry hands;Wash/dry face;Oral care;Sitting;Set up                                       Vision Baseline Vision/History: Wears glasses Patient Visual Report: No change from baseline            Cognition Arousal/Alertness: Awake/alert Behavior During Therapy: Flat affect Overall Cognitive Status: Impaired/Different from baseline Area of Impairment: Orientation;Attention;Following commands;Safety/judgement;Awareness;Problem solving                 Orientation Level: Disoriented to;Place;Time;Situation Current Attention Level: Focused   Following Commands: Follows one step commands inconsistently;Follows one step commands with increased time Safety/Judgement: Decreased awareness of  safety;Decreased awareness of deficits Awareness: Emergent Problem Solving: Slow processing;Decreased initiation;Difficulty sequencing;Requires verbal cues General Comments: perseverative on task and needing cuing/assist to terminate. He is pleasant but also confused.                   Pertinent Vitals/ Pain       Pain Assessment: No/denies pain         Frequency  Min 2X/week        Progress Toward  Goals  OT Goals(current goals can now be found in the care plan section)  Progress towards OT goals: Progressing toward goals  Acute Rehab OT Goals Patient Stated Goal: go home OT Goal Formulation: With patient Time For Goal Achievement: 06/09/20 Potential to Achieve Goals: Good  Plan Discharge plan remains appropriate       AM-PAC OT "6 Clicks" Daily Activity     Outcome Measure   Help from another person eating meals?: A Little Help from another person taking care of personal grooming?: A Little Help from another person toileting, which includes using toliet, bedpan, or urinal?: A Lot Help from another person bathing (including washing, rinsing, drying)?: A Lot Help from another person to put on and taking off regular upper body clothing?: A Little Help from another person to put on and taking off regular lower body clothing?: A Lot 6 Click Score: 15    End of Session Equipment Utilized During Treatment: Rolling walker  OT Visit Diagnosis: Unsteadiness on feet (R26.81);Repeated falls (R29.6);Muscle weakness (generalized) (M62.81)   Activity Tolerance Patient limited by fatigue   Patient Left in bed;with call bell/phone within reach;with bed alarm set;with nursing/sitter in room   Nurse Communication Mobility status;Precautions        Time: 4536-4680 OT Time Calculation (min): 24 min  Charges: OT General Charges $OT Visit: 1 Visit OT Treatments $Self Care/Home Management : 23-37 mins  Darleen Crocker, MS, OTR/L , CBIS ascom [phone]  05/30/20, 1:16 PM

## 2020-05-30 NOTE — Progress Notes (Signed)
PROGRESS NOTE    Lance [name]  [id number] DOB: 1945/12/27 DOA: 05/14/2020 PCP: Theotis Burrow, MD    Brief Narrative:  74 y.o.malewith medical history significant forhepatitis C was treated with Harvoni, insulin-dependent diabetes mellitus presented to the emergency department for chief concerns of being found unresponsive/altered mentation by his neighbors or friends.  MRI followed with further work-up.  Demonstrates findings consistent with amyloid angiopathy.  Patient does admit to cocaine use which he was strongly advised against given the high risk of intracranial hemorrhage in the setting of amyloid angiopathy  Psychiatry was consulted due to subacute delirium but felt he did not meet criteria for inpatient psychiatric placement.  Patient also had a meningioma seen on imaging.  Neurology and neurosurgery were on board however both are signed off.  Recommend outpatient follow-up with neurosurgery for meningioma and to continue Depakote for antiepileptic therapy  Patient been evaluated by therapy.  Have recommended SNF.  As of 05/24/2020 no bed offers have been made.  Per case management if we are unable to find a skilled nursing facility that accepts patient will need to pursue home with home health.  12/18: Informed by weekend case manager Raina Mina that patient's daughter had accepted a offered SNF bed and that insurance authorization is in progress.   Assessment & Plan:   Principal Problem:   Subacute delirium Active Problems:   Sepsis ([place])   Syncope   Malnutrition of moderate degree  Acute toxic metabolic encephalopathy Chronic innumerable microhemorrhages concerning for amyloid angiopathy Patient has wide swings and behavior between cooperative and lucid to combative and confused --Routine EEG with no seizures or epileptiform discharges. --likely encephalopathy delirium secondary to substance abuse Per neurology AMS secondary to meningioma and  slight vasogenic edema 12/16: Patient was alert awake and give appropriate responses to my interview questions this morning.  Mental status appears to be substantially improved from prior.  No recent reports of combative or aggressive behavior.  Patient has been adherent and compliant with recommendations of medical care. 12/17: Mental status improving daily.  Patient alert and communicative.  Pleasant, no distress.  Appropriate responses to all questions asked. --Suboxone d/c'ed on 12/19 (not sure why pt was ordered this on admission, since pt does not have Rx for this or any other opioids currently). Plan: --avoid sedatives --Delirium precautions --Follow up with outpatient neurologist in 6-8 weeks for concern for underlying cognitive dysfunction.  Sepsis, ruled out Procalcitonin negative.  No indication for antibiotics  Insulin-dependent diabetes mellitus, poorly controlled Last A1c 10.8, poor control --cont Levemir 10u nightly --SSI  Substance abuse Patient counseled on cocaine cessation.  With his amyloid angiopathy, he is VERY HIGH RISK of ICH with cocaine use.  Functional decline PT OT recommending SNF, patient and family have accepted a bed offer  HIV Cont HIV meds  Constipation, resolved large BM achieved after a total of 8 doses of Miralax 34g plus mag citrate.  --Miralax daily going foward   DVT prophylaxis: Lovenox Code Status: Full Family Communication:  Disposition Plan: Status is: Inpatient   Dispo: The patient is from: Home              Anticipated d/c is to: SNF              Anticipated d/c date is: whenever bed available.              Patient currently is medically stable to d/c.   Patient is medically stable for discharge.  He has not  demonstrated any aggressive or combative behavior.  He has been adherent to all medical and nursing care.  Per case management patient's daughter has accepted a bed offer.  Insurance authorization in progress.  Patient can  discharge to skilled nursing facility once authorization is complete.  Consultants:   Neurology  Psychiatry  Procedures:   None  Antimicrobials:   None   Subjective: Pt finally had large BM last night after a total of 8 doses of Miralax 34g plus mag citrate.  Pt reported feeling better today.  Eating well.  No N/V.     Objective: Vitals:   05/30/20 0608 05/30/20 0802 05/30/20 1122 05/30/20 1601  BP: (!) 154/91 (!) 150/84 137/77 116/72  Pulse: 81 78 70 73  Resp: 17 16 18 16   Temp: 98.7 F (37.1 C) 97.7 F (36.5 C) 98 F (36.7 C) 97.8 F (36.6 C)  TempSrc: Oral Oral  Oral  SpO2: 99% 100% 100% 100%  Weight:      Height:        Intake/Output Summary (Last 24 hours) at 05/30/2020 1803 Last data filed at 05/30/2020 1300 Gross per 24 hour  Intake --  Output 1401 ml  Net -1401 ml   Filed Weights   05/24/20 0446 05/26/20 0342 05/27/20 0322  Weight: 74.1 kg 74.7 kg 77.5 kg    Examination:  Constitutional: NAD, alert, oriented to self, cooperative, no insight HEENT: conjunctivae and lids normal, EOMI CV: No cyanosis.   RESP: normal respiratory effort, on RA Extremities: No effusions, edema in BLE SKIN: warm, dry and intact Neuro: II - XII grossly intact.   Psych: Normal mood and affect.     Data Reviewed: I have personally reviewed following labs and imaging studies  CBC: Recent Labs  Lab 05/24/20 0425 05/26/20 1005 05/29/20 0313  WBC 9.9 11.4* 12.6*  NEUTROABS 7.2 8.4*  --   HGB 12.6* 12.7* 11.3*  HCT 38.5* 40.1 33.8*  MCV 92.1 93.3 92.3  PLT 246 293 829   Basic Metabolic Panel: Recent Labs  Lab 05/24/20 0425 05/26/20 1005 05/29/20 0313  NA 145 141 140  K 4.2 4.0 4.3  CL 104 100 102  CO2 28 29 28   GLUCOSE 121* 180* 165*  BUN 22 27* 19  CREATININE 1.02 1.24 0.97  CALCIUM 9.4 8.9 8.8*  MG  --   --  2.3   GFR: Estimated Creatinine Clearance: 68.8 mL/min (by C-G formula based on SCr of 0.97 mg/dL). Liver Function Tests: Recent Labs   Lab 05/24/20 0425  AST 19  ALT 18  ALKPHOS 75  BILITOT 1.0  PROT 8.4*  ALBUMIN 3.3*   No results for input(s): LIPASE, AMYLASE in the last 168 hours. No results for input(s): AMMONIA in the last 168 hours. Coagulation Profile: No results for input(s): INR, PROTIME in the last 168 hours. Cardiac Enzymes: No results for input(s): CKTOTAL, CKMB, CKMBINDEX, TROPONINI in the last 168 hours. BNP (last 3 results) No results for input(s): PROBNP in the last 8760 hours. HbA1C: No results for input(s): HGBA1C in the last 72 hours. CBG: Recent Labs  Lab 05/29/20 1558 05/29/20 2123 05/30/20 0804 05/30/20 1123 05/30/20 1602  GLUCAP 214* 83 137* 118* 250*   Lipid Profile: No results for input(s): CHOL, HDL, LDLCALC, TRIG, CHOLHDL, LDLDIRECT in the last 72 hours. Thyroid Function Tests: No results for input(s): TSH, T4TOTAL, FREET4, T3FREE, THYROIDAB in the last 72 hours. Anemia Panel: No results for input(s): VITAMINB12, FOLATE, FERRITIN, TIBC, IRON, RETICCTPCT in the last 72  hours. Sepsis Labs: No results for input(s): PROCALCITON, LATICACIDVEN in the last 168 hours.  No results found for this or any previous visit (from the past 240 hour(s)).       Radiology Studies: No results found.      Scheduled Meds: . Chlorhexidine Gluconate Cloth  6 each Topical Daily  . citalopram  10 mg Oral Daily  . enoxaparin (LOVENOX) injection  40 mg Subcutaneous Q24H  . feeding supplement  237 mL Oral TID BM  . ferrous sulfate  325 mg Oral Q breakfast  . insulin aspart  0-15 Units Subcutaneous TID WC  . insulin aspart  0-5 Units Subcutaneous QHS  . insulin detemir  10 Units Subcutaneous Q2200  . multivitamin with minerals  1 tablet Oral Daily  . polyethylene glycol  17 g Oral Daily  . tamsulosin  0.8 mg Oral Daily  . thiamine  100 mg Oral Daily  . vitamin B-12  1,000 mcg Oral Daily   Continuous Infusions:   LOS: 15 days     Enzo Bi, MD [facility] Pager [phone]  [phone]  If 7PM-7AM, please contact night-coverage 05/30/2020, 6:03 PM

## 2020-05-30 NOTE — Care Management Important Message (Signed)
Important Message  Patient Details  Name: Lance [name] MRN: [id number] Date of Birth: Sep 25, 1945   Medicare Important Message Given:  Yes     [name] 05/30/2020, 11:07 AM

## 2020-05-31 DIAGNOSIS — I618 Other nontraumatic intracerebral hemorrhage: Secondary | ICD-10-CM

## 2020-05-31 DIAGNOSIS — F32A Depression, unspecified: Secondary | ICD-10-CM

## 2020-05-31 DIAGNOSIS — G9341 Metabolic encephalopathy: Secondary | ICD-10-CM

## 2020-05-31 DIAGNOSIS — E1165 Type 2 diabetes mellitus with hyperglycemia: Secondary | ICD-10-CM

## 2020-05-31 DIAGNOSIS — Z5320 Procedure and treatment not carried out because of patient's decision for unspecified reasons: Secondary | ICD-10-CM

## 2020-05-31 LAB — GLUCOSE, CAPILLARY
Glucose-Capillary: 90 mg/dL (ref 70–99)
Glucose-Capillary: 216 mg/dL — ABNORMAL HIGH (ref 70–99)
Glucose-Capillary: 185 mg/dL — ABNORMAL HIGH (ref 70–99)
Glucose-Capillary: 234 mg/dL — ABNORMAL HIGH (ref 70–99)

## 2020-05-31 NOTE — Progress Notes (Signed)
Patient ID: Lance [name], male   DOB: Jun 03, 1946, 74 y.o.   MRN: [id number] [facility] PROGRESS NOTE  Lance [name] [id number] DOB: 1945/10/19 DOA: 05/14/2020 PCP: Preston Fleeting, MD  HPI/Subjective: Patient feels okay.  Offers no complaints.  States he ate well.  Feels his strength is getting better.  Initially admitted with altered mental status.  Objective: Vitals:   05/31/20 0859 05/31/20 1248  BP: 115/82 126/79  Pulse: 74 67  Resp: 16 16  Temp: 97.7 F (36.5 C) 97.6 F (36.4 C)  SpO2: 100% 100%   No intake or output data in the 24 hours ending 05/31/20 1605 Filed Weights   05/24/20 0446 05/26/20 0342 05/27/20 0322  Weight: 74.1 kg 74.7 kg 77.5 kg    ROS: Review of Systems  Respiratory: Negative for shortness of breath.   Cardiovascular: Negative for chest pain.  Gastrointestinal: Negative for abdominal pain, nausea and vomiting.   Exam: Physical Exam HENT:     Head: Normocephalic.     Mouth/Throat:     Pharynx: No oropharyngeal exudate.  Eyes:     General: Lids are normal.     Conjunctiva/sclera: Conjunctivae normal.     Pupils: Pupils are equal, round, and reactive to light.  Cardiovascular:     Rate and Rhythm: Normal rate and regular rhythm.     Heart sounds: Normal heart sounds, S1 normal and S2 normal.  Pulmonary:     Breath sounds: Normal breath sounds. No decreased breath sounds, wheezing, rhonchi or rales.  Abdominal:     Palpations: Abdomen is soft.     Tenderness: There is no abdominal tenderness.  Musculoskeletal:     Right lower leg: No swelling.     Left lower leg: No swelling.  Skin:    General: Skin is warm.     Findings: No rash.  Neurological:     Mental Status: He is alert.     Comments: Answers questions and follows commands.       Data Reviewed: Basic Metabolic Panel: Recent Labs  Lab 05/26/20 1005 05/29/20 0313  NA 141 140  K 4.0 4.3  CL 100 102  CO2 29 28  GLUCOSE 180* 165*  BUN 27* 19   CREATININE 1.24 0.97  CALCIUM 8.9 8.8*  MG  --  2.3   CBC: Recent Labs  Lab 05/26/20 1005 05/29/20 0313  WBC 11.4* 12.6*  NEUTROABS 8.4*  --   HGB 12.7* 11.3*  HCT 40.1 33.8*  MCV 93.3 92.3  PLT 293 236    CBG: Recent Labs  Lab 05/30/20 1123 05/30/20 1602 05/30/20 2030 05/31/20 0902 05/31/20 1250  GLUCAP 118* 250* 217* 90 216*    Scheduled Meds: . Chlorhexidine Gluconate Cloth  6 each Topical Daily  . citalopram  10 mg Oral Daily  . enoxaparin (LOVENOX) injection  40 mg Subcutaneous Q24H  . feeding supplement  237 mL Oral TID BM  . ferrous sulfate  325 mg Oral Q breakfast  . insulin aspart  0-15 Units Subcutaneous TID WC  . insulin aspart  0-5 Units Subcutaneous QHS  . insulin detemir  10 Units Subcutaneous Q2200  . multivitamin with minerals  1 tablet Oral Daily  . polyethylene glycol  17 g Oral Daily  . tamsulosin  0.8 mg Oral Daily  . thiamine  100 mg Oral Daily  . vitamin B-12  1,000 mcg Oral Daily   Assessment/Plan:  1. Acute metabolic encephalopathy.  Patient on MRI had innumerable chronic microhemorrhages in the  brain suggesting amyloid angiopathy and also trace bilateral subdural hematomas.  Mental status better today than what was reported previously.  Patient answers questions appropriately. 2. Sepsis ruled out. 3. Type 2 diabetes mellitus poorly controlled with hemoglobin A1c 10.8.  Patient on low-dose detemir insulin and sliding scale. 4. Substance abuse with cocaine.  High risk of intracranial hemorrhage with cocaine use. 5. Functional decline.  PT and OT recommending nursing home.  Awaiting for insurance authorization 6. Hepatitis C history 7. BPH on Flomax 8. History of schizophrenia 9. Depression on Celexa     Code Status:     Code Status Orders  (From admission, onward)         Start     Ordered   05/14/20 2242  Full code  Continuous        05/14/20 2244        Code Status History    Date Active Date Inactive Code Status Order  ID Comments User Context   10/10/2016 0947 10/14/2016 1918 Full Code [id number]  Hillary Bow, MD ED   Advance Care Planning Activity     Family Communication: Spoke with daughter on the phone Disposition Plan: Status is: Inpatient  Dispo: The patient is from: Home              Anticipated d/c is to: Rehab              Anticipated d/c date is: Whenever insurance company approves rehab I will send him out of the hospital              Patient currently medically stable for discharge whenever insurance company approves authorization for rehab.  Time spent: 27 minutes  [place]

## 2020-05-31 NOTE — Progress Notes (Signed)
Nutrition Follow Up Note   DOCUMENTATION CODES:   Non-severe (moderate) malnutrition in context of social or environmental circumstances  INTERVENTION:   Ensure Enlive po TID, each supplement provides 350 kcal and 20 grams of protein  MVI daily   NUTRITION DIAGNOSIS:   Moderate Malnutrition related to social / environmental circumstances as evidenced by 9 percent weight loss in 2 weeks, mild to moderate muscle depletions, mild fat depletions.  GOAL:   Patient will meet greater than or equal to 90% of their needs -progressing   MONITOR:   PO intake,Supplement acceptance,Labs,Weight trends,Skin,I & O's  ASSESSMENT:   74 y.o. male with medical history significant for hepatitis C was treated with Harvoni, insulin-dependent diabetes mellitus, exposure to HIV, schizophrenia, meningioma, depression and substance abuse who is admitted with AMS and possible seizure  Pt continues to have good appetite and oral intake; pt eating 100% of most meals and drinking Ensure supplements. Per chart, pt is down ~10lbs since admit; pt is now down ~4lbs from his UBW. Plan is for SNF at discharge, pending insurance authorization.    Medications reviewed and include: celexa, lovenox, ferrous sulfate, insulin, MVI, miralax, thiamine, B12  Labs reviewed: wbc- 12.6(H) cbgs- 90, 216 x 24hrs  Diet Order:   Diet Order            Diet Carb Modified Fluid consistency: Thin; Room service appropriate? Yes  Diet effective now                EDUCATION NEEDS:   No education needs have been identified at this time  Skin:  Skin Assessment: Reviewed RN Assessment  Last BM:  12/21- TYPE 5  Height:   Ht Readings from Last 1 Encounters:  05/17/20 5\' 9"  (1.753 m)    Weight:   Wt Readings from Last 1 Encounters:  05/27/20 77.5 kg    Ideal Body Weight:  72.7 kg  BMI:  Body mass index is 25.24 kg/m.  Estimated Nutritional Needs:   Kcal:  1900-2200kcal/day  Protein:  95-110g/day  Fluid:   >1.9L/day  Koleen Distance MS, RD, LDN Please refer to [facility] for RD and/or RD on-call/weekend/after hours pager

## 2020-05-31 NOTE — Progress Notes (Signed)
Physical Therapy Treatment Patient Details Name: Lance [name] MRN: [id number] DOB: 1945/10/29 Today's Date: 05/31/2020    History of Present Illness Pt is a 74yo M admitted to [facility] on 05/14/20 because of concerns for AMS; friends found pt unresponsive on the ground and called EMS. Per EMS, CBG in 500's. Blood work concerning for leukocytosis and lactic acidosis. 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 PMH significant includes: T2DM, Hep C, schizophrenia, and sepsis.    PT Comments    Pt asleep, easily awakened, expresses desire to engage with meal. Pt remains confused, but follows simple commands with increased processing time. Pt abl to AMB 63f slowly with RW, a few LOB that require author to step in as righting strategies are insufficient and impaired at this time. Pt left up in recline with breakfast tray presented and set up. A short-term rehab (STR) stay would allow best opportunity for rapid recovery toward baseline and offer pt to become more safe and independent prior to return to home environment. The pt has current safety needs and/or physical assistance needs that cannot be met at home with home health services alone.     Follow Up Recommendations  SNF;Supervision/Assistance - 24 hour;Supervision for mobility/OOB     Equipment Recommendations  None recommended by PT    Recommendations for Other Services       Precautions / Restrictions Precautions Precautions: Fall    Mobility  Bed Mobility Overal bed mobility: Needs Assistance Bed Mobility: Supine to Sit     Supine to sit: Supervision     General bed mobility comments: strength appears good  Transfers Overall transfer level: Needs assistance Equipment used: Rolling walker (2 wheeled) Transfers: Sit to/from Stand Sit to Stand: Min guard         General transfer  comment: heavy cues for technique/safety  Ambulation/Gait Ambulation/Gait assistance: Min assist Gait Distance (Feet): 50 Feet Assistive device: Rolling walker (2 wheeled)       General Gait Details: delayed, bradykinetic, needs assist of RW when turning. does not appears to fatigue or have knee buckling as in prior days.   Stairs             Wheelchair Mobility    Modified Rankin (Stroke Patients Only)       Balance Overall balance assessment: Needs assistance                                          Cognition Arousal/Alertness: Awake/alert Behavior During Therapy: Flat affect;Impulsive Overall Cognitive Status: Impaired/Different from baseline                                 General Comments: remains flat, delayed, but follows simple commands with increased processing, minimal difficulty with basic problem solving.      Exercises      General Comments        Pertinent Vitals/Pain Pain Assessment: No/denies pain    Home Living                      Prior Function            PT Goals (current goals can now be found in the care plan  section) Acute Rehab PT Goals Patient Stated Goal: go home PT Goal Formulation: With patient Time For Goal Achievement: 06/09/20 Potential to Achieve Goals: Good Progress towards PT goals: Progressing toward goals    Frequency    Min 2X/week      PT Plan Current plan remains appropriate    Co-evaluation              AM-PAC PT "6 Clicks" Mobility   Outcome Measure  Help needed turning from your back to your side while in a flat bed without using bedrails?: A Little Help needed moving from lying on your back to sitting on the side of a flat bed without using bedrails?: A Little Help needed moving to and from a bed to a chair (including a wheelchair)?: A Lot Help needed standing up from a chair using your arms (e.g., wheelchair or bedside chair)?: A Lot Help needed  to walk in hospital room?: A Little Help needed climbing 3-5 steps with a railing? : A Lot 6 Click Score: 15    End of Session Equipment Utilized During Treatment: Gait belt Activity Tolerance: Patient tolerated treatment well;Other (comment) Patient left: with call bell/phone within reach;in chair;with chair alarm set (set up with breaskfast) Nurse Communication: Mobility status PT Visit Diagnosis: Unsteadiness on feet (R26.81);Difficulty in walking, not elsewhere classified (R26.2)     Time: 9733-1250 PT Time Calculation (min) (ACUTE ONLY): 14 min  Charges:  $Therapeutic Exercise: 8-22 mins                    9:23 AM, 05/31/20 Etta Grandchild, PT, DPT Physical Therapist - [facility], [place]  [phone] ([place])     Shammara Jarrett C 05/31/2020, 9:21 AM

## 2020-05-31 NOTE — TOC Progression Note (Signed)
Transition of Care [facility]) - Progression Note    Patient Details  Name: Lance [name] MRN: [id number] Date of Birth: 1946/03/23  Transition of Care [facility]) CM/SW [place], LCSW Phone Number: 05/31/2020, 1:46 PM  Clinical Narrative:   CSW called Otila Kluver at [facility] to inquire on insurance authorization status. She reported she has still not obtained insurance authorization.     Expected Discharge Plan: [place] Barriers to Discharge: Continued Medical Work up  Expected Discharge Plan and Services Expected Discharge Plan: [place] In-house Referral: Clinical Social Work   Post Acute Care Choice: [place] Living arrangements for the past 2 months: Single Family Home                                       Social Determinants of Health (SDOH) Interventions    Readmission Risk Interventions No flowsheet data found.

## 2020-06-01 DIAGNOSIS — E440 Moderate protein-calorie malnutrition: Secondary | ICD-10-CM

## 2020-06-01 LAB — CBC
HCT: 34.6 % — ABNORMAL LOW (ref 39.0–52.0)
Hemoglobin: 11.5 g/dL — ABNORMAL LOW (ref 13.0–17.0)
MCH: 30.4 pg (ref 26.0–34.0)
MCHC: 33.2 g/dL (ref 30.0–36.0)
MCV: 91.5 fL (ref 80.0–100.0)
Platelets: 265 10*3/uL (ref 150–400)
RBC: 3.78 MIL/uL — ABNORMAL LOW (ref 4.22–5.81)
RDW: 11.9 % (ref 11.5–15.5)
WBC: 9 10*3/uL (ref 4.0–10.5)
nRBC: 0 % (ref 0.0–0.2)

## 2020-06-01 LAB — BASIC METABOLIC PANEL
Anion gap: 5 (ref 5–15)
BUN: 18 mg/dL (ref 8–23)
CO2: 29 mmol/L (ref 22–32)
Calcium: 8.8 mg/dL — ABNORMAL LOW (ref 8.9–10.3)
Chloride: 102 mmol/L (ref 98–111)
Creatinine, Ser: 1.03 mg/dL (ref 0.61–1.24)
GFR, Estimated: >60 mL/min (ref >60)
Glucose, Bld: 133 mg/dL — ABNORMAL HIGH (ref 70–99)
Potassium: 4.7 mmol/L (ref 3.5–5.1)
Sodium: 136 mmol/L (ref 135–145)

## 2020-06-01 LAB — GLUCOSE, CAPILLARY
Glucose-Capillary: 107 mg/dL — ABNORMAL HIGH (ref 70–99)
Glucose-Capillary: 205 mg/dL — ABNORMAL HIGH (ref 70–99)
Glucose-Capillary: 153 mg/dL — ABNORMAL HIGH (ref 70–99)
Glucose-Capillary: 178 mg/dL — ABNORMAL HIGH (ref 70–99)

## 2020-06-01 LAB — RESP PANEL BY RT-PCR (FLU A&B, COVID) ARPGX2
Influenza A by PCR: NEGATIVE
Influenza B by PCR: NEGATIVE
SARS Coronavirus 2 by RT PCR: NEGATIVE

## 2020-06-01 NOTE — Progress Notes (Signed)
Occupational Therapy Treatment Patient Details Name: Lance [name] MRN: [id number] DOB: 10-Apr-1946 Today's Date: 06/01/2020    History of present illness Pt is a 74yo M admitted to [facility] on 05/14/20 because of concerns for AMS; friends found pt unresponsive on the ground and called EMS. Per EMS, CBG in 500's. Blood work concerning for leukocytosis and lactic acidosis. 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 PMH significant includes: T2DM, Hep C, schizophrenia, and sepsis.   OT comments  Lance [name] was seen for OT treatment on this date. Upon arrival to room pt reclined sidelying, easily awoken and agreeable to tx. Pt requires multimodal cues t/o to attend to task and safety. MOD A don B socks seated EOB. CGA + RW for toilet t/f, MAX A for perihygiene in standing. Pt left reclined in bed c bed alarm set, call bell in reach. Pt making progress toward goals. Pt continues to benefit from skilled OT services to maximize return to PLOF and minimize risk of future falls, injury, caregiver burden, and readmission. Will continue to follow POC. Discharge recommendation remains appropriate.    Follow Up Recommendations  SNF;Supervision/Assistance - 24 hour    Equipment Recommendations  Other (comment) (defer to next venue of care)    Recommendations for Other Services      Precautions / Restrictions Precautions Precautions: Fall       Mobility Bed Mobility Overal bed mobility: Needs Assistance Bed Mobility: Supine to Sit;Sit to Supine     Supine to sit: Supervision Sit to supine: Min guard      Transfers Overall transfer level: Needs assistance Equipment used: Rolling walker (2 wheeled) Transfers: Sit to/from Stand Sit to Stand: Min guard Stand pivot transfers: Min assist;Mod assist       General transfer comment: heavy cues for  technique/safety    Balance Overall balance assessment: Needs assistance Sitting-balance support: Feet supported;No upper extremity supported Sitting balance-Leahy Scale: Good     Standing balance support: During functional activity;Bilateral upper extremity supported Standing balance-Leahy Scale: Poor                             ADL either performed or assessed with clinical judgement   ADL Overall ADL's : Needs assistance/impaired                                       General ADL Comments: CGA + RW for toilet t/f, MAX A for perihygiene in standing. MOD A don B socks seated EOB.               Cognition Arousal/Alertness: Awake/alert Behavior During Therapy: Flat affect;Impulsive Overall Cognitive Status: Impaired/Different from baseline Area of Impairment: Orientation;Attention;Following commands;Safety/judgement;Awareness;Problem solving                       Following Commands: Follows one step commands consistently                Exercises Exercises: Other exercises Other Exercises Other Exercises: Pt educated re: d/c recs, safe t/f techniques Other Exercises: LBD, toileting, grooming, sup<>sit, sit<>stand, sitting/standing balance/tolerance           Pertinent Vitals/ Pain       Pain Assessment: No/denies pain  Frequency  Min 2X/week        Progress Toward Goals  OT Goals(current goals can now be found in the care plan section)  Progress towards OT goals: Progressing toward goals  Acute Rehab OT Goals Patient Stated Goal: go home OT Goal Formulation: With patient Time For Goal Achievement: 06/09/20 Potential to Achieve Goals: Good ADL Goals Pt Will Perform Grooming: with supervision Pt Will Transfer to Toilet: with supervision Pt Will Perform Toileting - Clothing Manipulation and hygiene: with supervision Pt Will Perform Tub/Shower Transfer: with supervision  Plan Discharge plan remains  appropriate;Frequency remains appropriate       AM-PAC OT "6 Clicks" Daily Activity     Outcome Measure   Help from another person eating meals?: A Little Help from another person taking care of personal grooming?: A Little Help from another person toileting, which includes using toliet, bedpan, or urinal?: A Lot Help from another person bathing (including washing, rinsing, drying)?: A Lot Help from another person to put on and taking off regular upper body clothing?: A Little Help from another person to put on and taking off regular lower body clothing?: A Lot 6 Click Score: 15    End of Session Equipment Utilized During Treatment: Rolling walker  OT Visit Diagnosis: Unsteadiness on feet (R26.81);Repeated falls (R29.6);Muscle weakness (generalized) (M62.81)   Activity Tolerance Patient tolerated treatment well   Patient Left in bed;with call bell/phone within reach;with bed alarm set   Nurse Communication          Time: 6568-1275 OT Time Calculation (min): 23 min  Charges: OT General Charges $OT Visit: 1 Visit OT Treatments $Self Care/Home Management : 8-22 mins $Therapeutic Activity: 8-22 mins  Dessie Coma, M.S. OTR/L  06/01/20, 1:30 PM  ascom [phone]

## 2020-06-01 NOTE — TOC Progression Note (Signed)
Transition of Care [facility]) - Progression Note    Patient Details  Name: Lance [name] MRN: [id number] Date of Birth: 06/09/1946  [facility] CM/SW [place], LCSW Phone Number: 06/01/2020, 3:35 PM  Clinical Narrative:   Still waiting on insurance auth for patient to go to [facility]. CSW informed TOC Supervisor of continued delay. Patient has no other bed offers at this time.    Expected Discharge Plan: [place] Barriers to Discharge: Continued Medical Work up  Expected Discharge Plan and Services Expected Discharge Plan: [place] In-house Referral: Clinical Social Work   Post Acute Care Choice: [place] Living arrangements for the past 2 months: Single Family Home                                       Social Determinants of Health (SDOH) Interventions    Readmission Risk Interventions No flowsheet data found.

## 2020-06-01 NOTE — Progress Notes (Addendum)
Physical Therapy Treatment Patient Details Name: Lance [name] MRN: [id number] DOB: [birth year] Today's Date: 06/01/2020    History of Present Illness Pt is a 74yo M admitted to [facility] on 05/14/20 because of concerns for AMS; friends found pt unresponsive on the ground and called EMS. Per EMS, CBG in 500's. Blood work concerning for leukocytosis and lactic acidosis. 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 PMH significant includes: T2DM, Hep C, schizophrenia, and sepsis.    PT Comments    Pt was asleep in long sitting upon arriving. Awakes with increased time and TCs/Vcs. Pt is disoriented and somewhat lethargic throughout session. Was able to follow simple one step commands with increased time to process. Pt was able to exit L side of bed, stand to RW, and ambulate prior to condom cath falling off and pt urinating all over bed/floor. He was able to ambulate 25 ft with RW prior to sitting in recliner. RN tech aware of cath and wet linens. Pt was in recliner with chair alarm in place,RN tech in room, and call bell in reach. Pt will need SNF at DC to address deficits while assisting pt to PLOF. Pt cognition limits pt's abilities more so than physical deficits.     Follow Up Recommendations  SNF;Supervision/Assistance - 24 hour;Supervision for mobility/OOB     Equipment Recommendations  None recommended by PT    Recommendations for Other Services       Precautions / Restrictions Precautions Precautions: Fall Restrictions Weight Bearing Restrictions: No    Mobility  Bed Mobility Overal bed mobility: Needs Assistance Bed Mobility: Supine to Sit;Sit to Supine Rolling: Supervision   Supine to sit: Supervision Sit to supine: Min guard   General bed mobility comments: Pt was able to exit L side of bed with increased time and TCs/vcs for  safety  Transfers Overall transfer level: Needs assistance Equipment used: Rolling walker (2 wheeled) Transfers: Sit to/from Stand Sit to Stand: Min guard Stand pivot transfers: Min assist;Mod assist       General transfer comment: CGA for safety. upon standing, pt's condom cath fell off and pt began urinated on floor. unaware he was actively peeing.  Ambulation/Gait Ambulation/Gait assistance: Min guard Gait Distance (Feet): 25 Feet Assistive device: Rolling walker (2 wheeled) Gait Pattern/deviations: Step-to pattern;Narrow base of support Gait velocity: very slow   General Gait Details: Very slow step to gait kinematics with vcs for safety and improved technique throughout. distances limited by incontinence of urine several times.       Balance Overall balance assessment: Needs assistance Sitting-balance support: Feet supported;No upper extremity supported Sitting balance-Leahy Scale: Good     Standing balance support: During functional activity;Bilateral upper extremity supported Standing balance-Leahy Scale: Poor       Cognition Arousal/Alertness: Lethargic;Suspect due to medications Behavior During Therapy: Flat affect Overall Cognitive Status: Impaired/Different from baseline Area of Impairment: Orientation;Attention;Following commands;Safety/judgement;Awareness;Problem solving      Orientation Level: Disoriented to;Place;Time;Situation Current Attention Level: Alternating   Following Commands: Follows one step commands consistently Safety/Judgement: Decreased awareness of deficits;Decreased awareness of safety Awareness: Emergent Problem Solving: Slow processing;Decreased initiation;Difficulty sequencing;Requires verbal cues General Comments: Pt is somewhat lethargic throughout is pleasantly confused and cooperative. session gtreatly limited by condom cath falling off and pt peeing on floor.      Exercises Other Exercises Other Exercises: Pt educated re:  d/c recs, safe t/f techniques  Other Exercises: LBD, toileting, grooming, sup<>sit, sit<>stand, sitting/standing balance/tolerance        Pertinent Vitals/Pain Pain Assessment: No/denies pain           PT Goals (current goals can now be found in the care plan section) Acute Rehab PT Goals Patient Stated Goal: none stated Progress towards PT goals: Progressing toward goals    Frequency    Min 2X/week      PT Plan Current plan remains appropriate       AM-PAC PT "6 Clicks" Mobility   Outcome Measure  Help needed turning from your back to your side while in a flat bed without using bedrails?: A Little Help needed moving from lying on your back to sitting on the side of a flat bed without using bedrails?: A Little Help needed moving to and from a bed to a chair (including a wheelchair)?: A Little Help needed standing up from a chair using your arms (e.g., wheelchair or bedside chair)?: A Little Help needed to walk in hospital room?: A Little Help needed climbing 3-5 steps with a railing? : A Little 6 Click Score: 18    End of Session Equipment Utilized During Treatment: Gait belt Activity Tolerance: Patient tolerated treatment well Patient left: with call bell/phone within reach;in chair;with chair alarm set;Other (comment) (RN tech in room to assist with clean up after urination) Nurse Communication: Mobility status PT Visit Diagnosis: Unsteadiness on feet (R26.81);Difficulty in walking, not elsewhere classified (R26.2)     Time: [id number] PT Time Calculation (min) (ACUTE ONLY): 23 min  Charges:  $Gait Training: 8-22 mins $Therapeutic Activity: 8-22 mins                     Julaine Fusi PTA 06/01/20, 3:01 PM

## 2020-06-01 NOTE — Progress Notes (Signed)
Patient ID: Lance [name], male   DOB: 12/05/1945, 74 y.o.   MRN: [id number] [facility] PROGRESS NOTE  Lance [name] [id number] DOB: 06/12/1945 DOA: 05/14/2020 PCP: Theotis Burrow, MD  HPI/Subjective: Patient feels wonderful but hopeful to feel better than that. Does not offer any complaints. Eating well. Patient feels like he is getting stronger. Admitted initially with altered mental status.  Objective: Vitals:   06/01/20 0740 06/01/20 1139  BP: 131/80 137/82  Pulse: 67 78  Resp: 18 18  Temp: 98.2 F (36.8 C) 97.7 F (36.5 C)  SpO2: 100% 100%    Intake/Output Summary (Last 24 hours) at 06/01/2020 1604 Last data filed at 06/01/2020 1358 Gross per 24 hour  Intake 720 ml  Output 1500 ml  Net -780 ml   Filed Weights   05/24/20 0446 05/26/20 0342 05/27/20 0322  Weight: 74.1 kg 74.7 kg 77.5 kg    ROS: Review of Systems  Respiratory: Negative for shortness of breath.   Cardiovascular: Negative for chest pain.  Gastrointestinal: Negative for abdominal pain, nausea and vomiting.   Exam: Physical Exam HENT:     Head: Normocephalic.     Mouth/Throat:     Pharynx: No oropharyngeal exudate.  Eyes:     General: Lids are normal.     Conjunctiva/sclera: Conjunctivae normal.     Pupils: Pupils are equal, round, and reactive to light.  Cardiovascular:     Rate and Rhythm: Normal rate and regular rhythm.     Heart sounds: Normal heart sounds, S1 normal and S2 normal.  Pulmonary:     Breath sounds: Normal breath sounds. No decreased breath sounds, wheezing, rhonchi or rales.  Abdominal:     Palpations: Abdomen is soft.     Tenderness: There is no abdominal tenderness.  Musculoskeletal:     Right lower leg: No swelling.     Left lower leg: No swelling.  Skin:    General: Skin is warm.     Findings: No rash.  Neurological:     Mental Status: He is alert.     Comments: Answers all questions appropriately.       Data Reviewed: Basic Metabolic  Panel: Recent Labs  Lab 05/26/20 1005 05/29/20 0313 06/01/20 0441  NA 141 140 136  K 4.0 4.3 4.7  CL 100 102 102  CO2 29 28 29   GLUCOSE 180* 165* 133*  BUN 27* 19 18  CREATININE 1.24 0.97 1.03  CALCIUM 8.9 8.8* 8.8*  MG  --  2.3  --    CBC: Recent Labs  Lab 05/26/20 1005 05/29/20 0313 06/01/20 0441  WBC 11.4* 12.6* 9.0  NEUTROABS 8.4*  --   --   HGB 12.7* 11.3* 11.5*  HCT 40.1 33.8* 34.6*  MCV 93.3 92.3 91.5  PLT 293 236 265    CBG: Recent Labs  Lab 05/31/20 1250 05/31/20 1633 05/31/20 2141 06/01/20 0740 06/01/20 1202  GLUCAP 216* 185* 234* 107* 205*    Recent Results (from the past 240 hour(s))  Resp Panel by RT-PCR (Flu A&B, Covid) Nasopharyngeal Swab     Status: None   Collection Time: 06/01/20 10:25 AM   Specimen: Nasopharyngeal Swab; Nasopharyngeal(NP) swabs in vial transport medium  Result Value Ref Range Status   SARS Coronavirus 2 by RT PCR NEGATIVE NEGATIVE Final    Comment: (NOTE) SARS-CoV-2 target nucleic acids are NOT DETECTED.  The SARS-CoV-2 RNA is generally detectable in upper respiratory specimens during the acute phase of infection. The lowest concentration of SARS-CoV-2  viral copies this assay can detect is 138 copies/mL. A negative result does not preclude SARS-Cov-2 infection and should not be used as the sole basis for treatment or other patient management decisions. A negative result may occur with  improper specimen collection/handling, submission of specimen other than nasopharyngeal swab, presence of viral mutation(s) within the areas targeted by this assay, and inadequate number of viral copies(<138 copies/mL). A negative result must be combined with clinical observations, patient history, and epidemiological information. The expected result is Negative.  Fact Sheet for Patients:  EntrepreneurPulse.com.au  Fact Sheet for Healthcare Providers:  IncredibleEmployment.be  This test is no t  yet approved or cleared by the [place] FDA and  has been authorized for detection and/or diagnosis of SARS-CoV-2 by FDA under an Emergency Use Authorization (EUA). This EUA will remain  in effect (meaning this test can be used) for the duration of the COVID-19 declaration under Section 564(b)(1) of the Act, 21 U.S.C.section 360bbb-3(b)(1), unless the authorization is terminated  or revoked sooner.       Influenza A by PCR NEGATIVE NEGATIVE Final   Influenza B by PCR NEGATIVE NEGATIVE Final    Comment: (NOTE) The Xpert Xpress SARS-CoV-2/FLU/RSV plus assay is intended as an aid in the diagnosis of influenza from Nasopharyngeal swab specimens and should not be used as a sole basis for treatment. Nasal washings and aspirates are unacceptable for Xpert Xpress SARS-CoV-2/FLU/RSV testing.  Fact Sheet for Patients: EntrepreneurPulse.com.au  Fact Sheet for Healthcare Providers: IncredibleEmployment.be  This test is not yet approved or cleared by the [place] FDA and has been authorized for detection and/or diagnosis of SARS-CoV-2 by FDA under an Emergency Use Authorization (EUA). This EUA will remain in effect (meaning this test can be used) for the duration of the COVID-19 declaration under Section 564(b)(1) of the Act, 21 U.S.C. section 360bbb-3(b)(1), unless the authorization is terminated or revoked.  Performed at [facility], [address] [place], [place] [address]       Scheduled Meds: . Chlorhexidine Gluconate Cloth  6 each Topical Daily  . citalopram  10 mg Oral Daily  . enoxaparin (LOVENOX) injection  40 mg Subcutaneous Q24H  . feeding supplement  237 mL Oral TID BM  . ferrous sulfate  325 mg Oral Q breakfast  . insulin aspart  0-15 Units Subcutaneous TID WC  . insulin aspart  0-5 Units Subcutaneous QHS  . insulin detemir  10 Units Subcutaneous Q2200  . multivitamin with minerals  1 tablet Oral Daily  .  polyethylene glycol  17 g Oral Daily  . tamsulosin  0.8 mg Oral Daily  . thiamine  100 mg Oral Daily  . vitamin B-12  1,000 mcg Oral Daily    Assessment/Plan:  1. Acute metabolic encephalopathy. On MRI of the brain the patient had innumerable chronic microhemorrhages suggesting amyloid angiopathy. Patient also had trace bilateral subdural hematomas and also meningioma. Patient answering questions appropriately. 2. Sepsis ruled out 3. Type 2 diabetes mellitus poorly controlled with hemoglobin A1c of 10.8. Patient on low-dose detemir insulin and sliding scale. 4. Substance abuse with cocaine. High risk of intracranial hemorrhage with cocaine use. 5. Functional decline. Physical therapy and occupational therapy recommending rehab. Still awaiting insurance authorization. 6. Hepatitis C history 7. BPH. Continue Flomax 8. History of schizophrenia 9. Depression on Celexa 10. Moderate malnutrition     Code Status:     Code Status Orders  (From admission, onward)         Start  Ordered   05/14/20 2242  Full code  Continuous        05/14/20 2244        Code Status History    Date Active Date Inactive Code Status Order ID Comments User Context   10/10/2016 [id number] 10/14/2016 1918 Full Code [id number]  Hillary Bow, MD ED   Advance Care Planning Activity     Family Communication: Spoke with daughter on the phone yesterday Disposition Plan: Status is: Inpatient  Dispo: The patient is from: Home              Anticipated d/c is to: Rehab              Anticipated d/c date is: Once insurance authorizes rehab I will send him out of the hospital to rehab.              Patient currently medically stable for discharge to rehab. Again still awaiting insurance authorization.  Time spent: 28 minutes  [place]

## 2020-06-02 LAB — GLUCOSE, CAPILLARY
Glucose-Capillary: 106 mg/dL — ABNORMAL HIGH (ref 70–99)
Glucose-Capillary: 196 mg/dL — ABNORMAL HIGH (ref 70–99)
Glucose-Capillary: 230 mg/dL — ABNORMAL HIGH (ref 70–99)
Glucose-Capillary: 203 mg/dL — ABNORMAL HIGH (ref 70–99)
Glucose-Capillary: 257 mg/dL — ABNORMAL HIGH (ref 70–99)

## 2020-06-02 NOTE — Care Management Important Message (Signed)
Important Message  Patient Details  Name: Lance [name] MRN: [id number] Date of Birth: 1945/11/09   Medicare Important Message Given:  Yes     Juliann Pulse A [name] 06/02/2020, 11:56 AM

## 2020-06-02 NOTE — TOC Progression Note (Signed)
Transition of Care [facility]) - Progression Note    Patient Details  Name: Lance [name] MRN: [id number] Date of Birth: [birth year]  [facility] Care [facility]) CM/SW Contact  Shelbie Hutching, RN Phone Number: 06/02/2020, 12:03 PM  Clinical Narrative:     Called to check on the status of [employer] authorization, per [place] is still pending.  RNCM will send updated clinicals to [facility] fax [phone].  Reference number [id number].  Expected Discharge Plan: [place] Barriers to Discharge: Continued Medical Work up  Expected Discharge Plan and Services Expected Discharge Plan: [place] In-house Referral: Clinical Social Work   Post Acute Care Choice: [place] Living arrangements for the past 2 months: Single Family Home                                       Social Determinants of Health (SDOH) Interventions    Readmission Risk Interventions No flowsheet data found.

## 2020-06-02 NOTE — Progress Notes (Signed)
Patient ID: Lance [name], male   DOB: 1946/05/04, 74 y.o.   MRN: [id number] [facility] PROGRESS NOTE  Lance [name] [id number]:[id number] DOB: Sep 12, 1945 DOA: 05/14/2020 PCP: Theotis Burrow, MD  HPI/Subjective: Patient does answer some yes or no questions but not elaborating much today.  Patient's daughter also notices that he does not elaborate much and answers with yes or no answers.  She is coming into visit and she will try to talk with him more today.  Admitted with altered mental status.  Objective: Vitals:   06/02/20 0758 06/02/20 1146  BP: 130/79 119/81  Pulse: 70 71  Resp: 18 18  Temp: 97.6 F (36.4 C) 97.7 F (36.5 C)  SpO2: 100% 100%    Intake/Output Summary (Last 24 hours) at 06/02/2020 1521 Last data filed at 06/02/2020 1500 Gross per 24 hour  Intake 840 ml  Output 1450 ml  Net -610 ml   Filed Weights   05/24/20 0446 05/26/20 0342 05/27/20 0322  Weight: 74.1 kg 74.7 kg 77.5 kg    ROS: Review of Systems  Respiratory: Negative for shortness of breath.   Cardiovascular: Negative for chest pain.  Gastrointestinal: Negative for abdominal pain.   Exam: Physical Exam HENT:     Head: Normocephalic.     Mouth/Throat:     Pharynx: No oropharyngeal exudate.  Eyes:     General: Lids are normal.     Conjunctiva/sclera: Conjunctivae normal.     Pupils: Pupils are equal, round, and reactive to light.  Cardiovascular:     Rate and Rhythm: Normal rate and regular rhythm.     Heart sounds: Normal heart sounds, S1 normal and S2 normal.  Pulmonary:     Breath sounds: No decreased breath sounds, wheezing, rhonchi or rales.  Abdominal:     Palpations: Abdomen is soft.     Tenderness: There is no abdominal tenderness.  Musculoskeletal:     Right ankle: No swelling.     Left ankle: No swelling.  Skin:    General: Skin is warm.     Findings: No rash.  Neurological:     Mental Status: He is alert.     Comments: Answers yes and no questions and follows  commands.       Data Reviewed: Basic Metabolic Panel: Recent Labs  Lab 05/29/20 0313 06/01/20 0441  NA 140 136  K 4.3 4.7  CL 102 102  CO2 28 29  GLUCOSE 165* 133*  BUN 19 18  CREATININE 0.97 1.03  CALCIUM 8.8* 8.8*  MG 2.3  --    CBC: Recent Labs  Lab 05/29/20 0313 06/01/20 0441  WBC 12.6* 9.0  HGB 11.3* 11.5*  HCT 33.8* 34.6*  MCV 92.3 91.5  PLT 236 265    CBG: Recent Labs  Lab 06/01/20 1202 06/01/20 1636 06/01/20 2129 06/02/20 0758 06/02/20 1146  GLUCAP 205* 153* 178* 106* 196*    Recent Results (from the past 240 hour(s))  Resp Panel by RT-PCR (Flu A&B, Covid) Nasopharyngeal Swab     Status: None   Collection Time: 06/01/20 10:25 AM   Specimen: Nasopharyngeal Swab; Nasopharyngeal(NP) swabs in vial transport medium  Result Value Ref Range Status   SARS Coronavirus 2 by RT PCR NEGATIVE NEGATIVE Final    Comment: (NOTE) SARS-CoV-2 target nucleic acids are NOT DETECTED.  The SARS-CoV-2 RNA is generally detectable in upper respiratory specimens during the acute phase of infection. The lowest concentration of SARS-CoV-2 viral copies this assay can detect is 138 copies/mL. A  negative result does not preclude SARS-Cov-2 infection and should not be used as the sole basis for treatment or other patient management decisions. A negative result may occur with  improper specimen collection/handling, submission of specimen other than nasopharyngeal swab, presence of viral mutation(s) within the areas targeted by this assay, and inadequate number of viral copies(<138 copies/mL). A negative result must be combined with clinical observations, patient history, and epidemiological information. The expected result is Negative.  Fact Sheet for Patients:  BloggerCourse.com  Fact Sheet for Healthcare Providers:  SeriousBroker.it  This test is no t yet approved or cleared by the [place] FDA and  has been  authorized for detection and/or diagnosis of SARS-CoV-2 by FDA under an Emergency Use Authorization (EUA). This EUA will remain  in effect (meaning this test can be used) for the duration of the COVID-19 declaration under Section 564(b)(1) of the Act, 21 U.S.C.section 360bbb-3(b)(1), unless the authorization is terminated  or revoked sooner.       Influenza A by PCR NEGATIVE NEGATIVE Final   Influenza B by PCR NEGATIVE NEGATIVE Final    Comment: (NOTE) The Xpert Xpress SARS-CoV-2/FLU/RSV plus assay is intended as an aid in the diagnosis of influenza from Nasopharyngeal swab specimens and should not be used as a sole basis for treatment. Nasal washings and aspirates are unacceptable for Xpert Xpress SARS-CoV-2/FLU/RSV testing.  Fact Sheet for Patients: BloggerCourse.com  Fact Sheet for Healthcare Providers: SeriousBroker.it  This test is not yet approved or cleared by the [place] FDA and has been authorized for detection and/or diagnosis of SARS-CoV-2 by FDA under an Emergency Use Authorization (EUA). This EUA will remain in effect (meaning this test can be used) for the duration of the COVID-19 declaration under Section 564(b)(1) of the Act, 21 U.S.C. section 360bbb-3(b)(1), unless the authorization is terminated or revoked.  Performed at [facility], [address] [place], [place] [address]       Scheduled Meds: . Chlorhexidine Gluconate Cloth  6 each Topical Daily  . citalopram  10 mg Oral Daily  . enoxaparin (LOVENOX) injection  40 mg Subcutaneous Q24H  . feeding supplement  237 mL Oral TID BM  . ferrous sulfate  325 mg Oral Q breakfast  . insulin aspart  0-15 Units Subcutaneous TID WC  . insulin aspart  0-5 Units Subcutaneous QHS  . insulin detemir  10 Units Subcutaneous Q2200  . multivitamin with minerals  1 tablet Oral Daily  . polyethylene glycol  17 g Oral Daily  . tamsulosin  0.8 mg Oral  Daily  . thiamine  100 mg Oral Daily  . vitamin B-12  1,000 mcg Oral Daily    Assessment/Plan:  1. Awaiting insurance authorization to go out to rehab. 2. Acute metabolic encephalopathy.  MRI of the brain showed innumerable chronic microhemorrhages suggesting amyloid angiopathy.  Patient also had bilateral subdural hematomas and a meningioma.  Patient is very simple with his answers.  I do not think he will be able to take care of himself at home.  The patient lives alone.  The patient's daughter does have a full-time job. 3. Type 2 diabetes mellitus poorly controlled with hemoglobin A1c of 10.8.  Patient on low-dose detemir insulin and sliding scale 4. Substance abuse with cocaine. 5. Functional decline.  Physical therapy and Occupational Therapy are both recommending rehab.  Still awaiting insurance authorization 6. Hepatitis C history 7. BPH.  On Flomax 8. Depression on Celexa and history of schizophrenia 9. Moderate malnutrition  Code Status:     Code Status Orders  (From admission, onward)         Start     Ordered   05/14/20 2242  Full code  Continuous        05/14/20 2244        Code Status History    Date Active Date Inactive Code Status Order ID Comments User Context   10/10/2016 0947 10/14/2016 1918 Full Code [id number]  Hillary Bow, MD ED   Advance Care Planning Activity     Family Communication: Spoke with daughter on the phone Disposition Plan: Status is: Inpatient  Dispo: The patient is from: Home alone              Anticipated d/c is to: Rehab once insurance authorization obtained              Anticipated d/c date is: Could have discharged 3 days ago if insurance authorizes rehab.  Will discharge once I hear about authorization.              Patient currently not medically stable to go home by himself I do not think he will be able to take care of himself.  Patient medically stable to go to rehab.  Time spent: 27 minutes  [place]

## 2020-06-03 DIAGNOSIS — B1920 Unspecified viral hepatitis C without hepatic coma: Secondary | ICD-10-CM

## 2020-06-03 LAB — GLUCOSE, CAPILLARY
Glucose-Capillary: 230 mg/dL — ABNORMAL HIGH (ref 70–99)
Glucose-Capillary: 239 mg/dL — ABNORMAL HIGH (ref 70–99)
Glucose-Capillary: 218 mg/dL — ABNORMAL HIGH (ref 70–99)
Glucose-Capillary: 252 mg/dL — ABNORMAL HIGH (ref 70–99)

## 2020-06-03 NOTE — Progress Notes (Signed)
Patient ID: Lance [name], male   DOB: 1946-04-17, 74 y.o.   MRN: [id number] [facility] PROGRESS NOTE  Lance [name] YQM:[id number] DOB: [birth year] DOA: 05/14/2020 PCP: Theotis Burrow, MD  HPI/Subjective: Patient answers some simple yes or no questions.  States he feels okay.  Trouble thinking about and getting out longer sentences.  Interested to know when he is getting out of the hospital.  Admitted initially with altered mental status.  Objective: Vitals:   06/03/20 0748 06/03/20 1136  BP: (!) 144/82 112/67  Pulse: 74 77  Resp: 16 17  Temp: 97.8 F (36.6 C) 97.8 F (36.6 C)  SpO2: 100% 100%    Intake/Output Summary (Last 24 hours) at 06/03/2020 1309 Last data filed at 06/03/2020 0000 Gross per 24 hour  Intake 240 ml  Output 1400 ml  Net -1160 ml   Filed Weights   05/24/20 0446 05/26/20 0342 05/27/20 0322  Weight: 74.1 kg 74.7 kg 77.5 kg    ROS: Review of Systems  Respiratory: Negative for shortness of breath.   Cardiovascular: Negative for chest pain.  Gastrointestinal: Negative for abdominal pain.   Exam: Physical Exam HENT:     Head: Normocephalic.     Mouth/Throat:     Pharynx: No oropharyngeal exudate.  Eyes:     General: Lids are normal.     Conjunctiva/sclera: Conjunctivae normal.     Pupils: Pupils are equal, round, and reactive to light.  Cardiovascular:     Rate and Rhythm: Normal rate and regular rhythm.     Heart sounds: Normal heart sounds, S1 normal and S2 normal.  Pulmonary:     Breath sounds: Normal breath sounds. No decreased breath sounds, wheezing, rhonchi or rales.  Abdominal:     Palpations: Abdomen is soft.     Tenderness: There is no abdominal tenderness.  Musculoskeletal:     Right lower leg: No swelling.     Left lower leg: No swelling.  Skin:    General: Skin is warm.     Findings: No rash.  Neurological:     Mental Status: He is alert.     Comments: Answers some yes or no questions.       Data  Reviewed: Basic Metabolic Panel: Recent Labs  Lab 05/29/20 0313 06/01/20 0441  NA 140 136  K 4.3 4.7  CL 102 102  CO2 28 29  GLUCOSE 165* 133*  BUN 19 18  CREATININE 0.97 1.03  CALCIUM 8.8* 8.8*  MG 2.3  --    CBC: Recent Labs  Lab 05/29/20 0313 06/01/20 0441  WBC 12.6* 9.0  HGB 11.3* 11.5*  HCT 33.8* 34.6*  MCV 92.3 91.5  PLT 236 265    CBG: Recent Labs  Lab 06/02/20 1630 06/02/20 1958 06/02/20 2155 06/03/20 0747 06/03/20 1135  GLUCAP 230* 203* 257* 230* 239*    Recent Results (from the past 240 hour(s))  Resp Panel by RT-PCR (Flu A&B, Covid) Nasopharyngeal Swab     Status: None   Collection Time: 06/01/20 10:25 AM   Specimen: Nasopharyngeal Swab; Nasopharyngeal(NP) swabs in vial transport medium  Result Value Ref Range Status   SARS Coronavirus 2 by RT PCR NEGATIVE NEGATIVE Final    Comment: (NOTE) SARS-CoV-2 target nucleic acids are NOT DETECTED.  The SARS-CoV-2 RNA is generally detectable in upper respiratory specimens during the acute phase of infection. The lowest concentration of SARS-CoV-2 viral copies this assay can detect is 138 copies/mL. A negative result does not preclude SARS-Cov-2 infection and  should not be used as the sole basis for treatment or other patient management decisions. A negative result may occur with  improper specimen collection/handling, submission of specimen other than nasopharyngeal swab, presence of viral mutation(s) within the areas targeted by this assay, and inadequate number of viral copies(<138 copies/mL). A negative result must be combined with clinical observations, patient history, and epidemiological information. The expected result is Negative.  Fact Sheet for Patients:  EntrepreneurPulse.com.au  Fact Sheet for Healthcare Providers:  IncredibleEmployment.be  This test is no t yet approved or cleared by the [place] FDA and  has been authorized for detection and/or  diagnosis of SARS-CoV-2 by FDA under an Emergency Use Authorization (EUA). This EUA will remain  in effect (meaning this test can be used) for the duration of the COVID-19 declaration under Section 564(b)(1) of the Act, 21 U.S.C.section 360bbb-3(b)(1), unless the authorization is terminated  or revoked sooner.       Influenza A by PCR NEGATIVE NEGATIVE Final   Influenza B by PCR NEGATIVE NEGATIVE Final    Comment: (NOTE) The Xpert Xpress SARS-CoV-2/FLU/RSV plus assay is intended as an aid in the diagnosis of influenza from Nasopharyngeal swab specimens and should not be used as a sole basis for treatment. Nasal washings and aspirates are unacceptable for Xpert Xpress SARS-CoV-2/FLU/RSV testing.  Fact Sheet for Patients: EntrepreneurPulse.com.au  Fact Sheet for Healthcare Providers: IncredibleEmployment.be  This test is not yet approved or cleared by the [place] FDA and has been authorized for detection and/or diagnosis of SARS-CoV-2 by FDA under an Emergency Use Authorization (EUA). This EUA will remain in effect (meaning this test can be used) for the duration of the COVID-19 declaration under Section 564(b)(1) of the Act, 21 U.S.C. section 360bbb-3(b)(1), unless the authorization is terminated or revoked.  Performed at [facility], [address] [place], [place] [address]       Scheduled Meds: . Chlorhexidine Gluconate Cloth  6 each Topical Daily  . citalopram  10 mg Oral Daily  . enoxaparin (LOVENOX) injection  40 mg Subcutaneous Q24H  . feeding supplement  237 mL Oral TID BM  . ferrous sulfate  325 mg Oral Q breakfast  . insulin aspart  0-15 Units Subcutaneous TID WC  . insulin aspart  0-5 Units Subcutaneous QHS  . insulin detemir  10 Units Subcutaneous Q2200  . multivitamin with minerals  1 tablet Oral Daily  . polyethylene glycol  17 g Oral Daily  . tamsulosin  0.8 mg Oral Daily  . thiamine  100 mg Oral  Daily  . vitamin B-12  1,000 mcg Oral Daily    Assessment/Plan:  1. Acute metabolic encephalopathy.  MRI of the brain shows innumerable chronic microhemorrhages suggesting amyloid angiopathy.  Patient also has a meningioma and bilateral subdural hematomas.  The patient does answer questions with simple yes or no questions.  I do not think that he can go back to his own house by himself. 2. Type 2 diabetes mellitus poorly controlled with hemoglobin A1c of 10.8.  Continue low-dose detemir insulin and sliding scale 3. Substance abuse with cocaine prior to coming in 4. Functional decline.  PT and OT recommending rehab.  I do not think the patient can take care of himself at home. 5. History of hepatitis C 6. BPH.  Continue Flomax 7. Depression.  Continue Celexa 8. Moderate malnutrition     Code Status:     Code Status Orders  (From admission, onward)  Start     Ordered   05/14/20 2242  Full code  Continuous        05/14/20 2244        Code Status History    Date Active Date Inactive Code Status Order ID Comments User Context   10/10/2016 [id number] 10/14/2016 1918 Full Code [id number]  Hillary Bow, MD ED   Advance Care Planning Activity     Family Communication: Daughter yesterday Disposition Plan: Status is: Inpatient  Dispo: The patient is from: Home              Anticipated d/c is to: Rehab              Anticipated d/c date is: Since this is the holiday weekend, we will likely not hear from the insurance company about rehab.              Patient currently medically stable to go out to rehab once insurance company approves the rehab.  Unsafe to go home by himself.  Time spent: 26 minutes  [place]

## 2020-06-04 LAB — CBC
HCT: 34 % — ABNORMAL LOW (ref 39.0–52.0)
Hemoglobin: 11.3 g/dL — ABNORMAL LOW (ref 13.0–17.0)
MCH: 30.4 pg (ref 26.0–34.0)
MCHC: 33.2 g/dL (ref 30.0–36.0)
MCV: 91.4 fL (ref 80.0–100.0)
Platelets: 280 10*3/uL (ref 150–400)
RBC: 3.72 MIL/uL — ABNORMAL LOW (ref 4.22–5.81)
RDW: 11.9 % (ref 11.5–15.5)
WBC: 10.6 10*3/uL — ABNORMAL HIGH (ref 4.0–10.5)
nRBC: 0 % (ref 0.0–0.2)

## 2020-06-04 LAB — GLUCOSE, CAPILLARY
Glucose-Capillary: 150 mg/dL — ABNORMAL HIGH (ref 70–99)
Glucose-Capillary: 249 mg/dL — ABNORMAL HIGH (ref 70–99)
Glucose-Capillary: 166 mg/dL — ABNORMAL HIGH (ref 70–99)
Glucose-Capillary: 288 mg/dL — ABNORMAL HIGH (ref 70–99)

## 2020-06-04 NOTE — Progress Notes (Signed)
Patient ID: Lance [name], male   DOB: 03-13-1946, 74 y.o.   MRN: [id number] [facility] PROGRESS NOTE  SHERRELL FARISH [id number]:[id number] DOB: [birth year] DOA: 05/14/2020 PCP: Theotis Burrow, MD  HPI/Subjective: Patient answers a few questions.  Does not ask me any questions.  Patient offers no complaints.  Admitted with acute metabolic encephalopathy.  Objective: Vitals:   06/04/20 0439 06/04/20 0844  BP: (!) 143/82 121/83  Pulse: 70 68  Resp: 18 20  Temp: 98.3 F (36.8 C) 98.1 F (36.7 C)  SpO2: 100% 100%    Intake/Output Summary (Last 24 hours) at 06/04/2020 1200 Last data filed at 06/04/2020 0305 Gross per 24 hour  Intake 237 ml  Output 1200 ml  Net -963 ml   Filed Weights   05/24/20 0446 05/26/20 0342 05/27/20 0322  Weight: 74.1 kg 74.7 kg 77.5 kg    ROS: Review of Systems  Respiratory: Negative for shortness of breath.   Cardiovascular: Negative for chest pain.  Gastrointestinal: Negative for abdominal pain.   Exam: Physical Exam HENT:     Head: Normocephalic.     Mouth/Throat:     Pharynx: No oropharyngeal exudate.  Eyes:     General: Lids are normal.     Conjunctiva/sclera: Conjunctivae normal.  Cardiovascular:     Rate and Rhythm: Normal rate and regular rhythm.     Heart sounds: Normal heart sounds, S1 normal and S2 normal.  Pulmonary:     Breath sounds: Normal breath sounds. No decreased breath sounds, wheezing, rhonchi or rales.  Abdominal:     Palpations: Abdomen is soft.     Tenderness: There is no abdominal tenderness.  Musculoskeletal:     Right lower leg: No swelling.     Left lower leg: No swelling.  Skin:    General: Skin is warm.     Findings: No rash.  Neurological:     Mental Status: He is alert.     Comments: Patient answers some yes or no questions.       Data Reviewed: Basic Metabolic Panel: Recent Labs  Lab 05/29/20 0313 06/01/20 0441  NA 140 136  K 4.3 4.7  CL 102 102  CO2 28 29  GLUCOSE 165* 133*   BUN 19 18  CREATININE 0.97 1.03  CALCIUM 8.8* 8.8*  MG 2.3  --    CBC: Recent Labs  Lab 05/29/20 0313 06/01/20 0441 06/04/20 0447  WBC 12.6* 9.0 10.6*  HGB 11.3* 11.5* 11.3*  HCT 33.8* 34.6* 34.0*  MCV 92.3 91.5 91.4  PLT 236 265 280   CBG: Recent Labs  Lab 06/03/20 0747 06/03/20 1135 06/03/20 1627 06/03/20 2120 06/04/20 0845  GLUCAP 230* 239* 218* 252* 150*    Recent Results (from the past 240 hour(s))  Resp Panel by RT-PCR (Flu A&B, Covid) Nasopharyngeal Swab     Status: None   Collection Time: 06/01/20 10:25 AM   Specimen: Nasopharyngeal Swab; Nasopharyngeal(NP) swabs in vial transport medium  Result Value Ref Range Status   SARS Coronavirus 2 by RT PCR NEGATIVE NEGATIVE Final    Comment: (NOTE) SARS-CoV-2 target nucleic acids are NOT DETECTED.  The SARS-CoV-2 RNA is generally detectable in upper respiratory specimens during the acute phase of infection. The lowest concentration of SARS-CoV-2 viral copies this assay can detect is 138 copies/mL. A negative result does not preclude SARS-Cov-2 infection and should not be used as the sole basis for treatment or other patient management decisions. A negative result may occur with  improper specimen  collection/handling, submission of specimen other than nasopharyngeal swab, presence of viral mutation(s) within the areas targeted by this assay, and inadequate number of viral copies(<138 copies/mL). A negative result must be combined with clinical observations, patient history, and epidemiological information. The expected result is Negative.  Fact Sheet for Patients:  EntrepreneurPulse.com.au  Fact Sheet for Healthcare Providers:  IncredibleEmployment.be  This test is no t yet approved or cleared by the [place] FDA and  has been authorized for detection and/or diagnosis of SARS-CoV-2 by FDA under an Emergency Use Authorization (EUA). This EUA will remain  in effect  (meaning this test can be used) for the duration of the COVID-19 declaration under Section 564(b)(1) of the Act, 21 U.S.C.section 360bbb-3(b)(1), unless the authorization is terminated  or revoked sooner.       Influenza A by PCR NEGATIVE NEGATIVE Final   Influenza B by PCR NEGATIVE NEGATIVE Final    Comment: (NOTE) The Xpert Xpress SARS-CoV-2/FLU/RSV plus assay is intended as an aid in the diagnosis of influenza from Nasopharyngeal swab specimens and should not be used as a sole basis for treatment. Nasal washings and aspirates are unacceptable for Xpert Xpress SARS-CoV-2/FLU/RSV testing.  Fact Sheet for Patients: EntrepreneurPulse.com.au  Fact Sheet for Healthcare Providers: IncredibleEmployment.be  This test is not yet approved or cleared by the [place] FDA and has been authorized for detection and/or diagnosis of SARS-CoV-2 by FDA under an Emergency Use Authorization (EUA). This EUA will remain in effect (meaning this test can be used) for the duration of the COVID-19 declaration under Section 564(b)(1) of the Act, 21 U.S.C. section 360bbb-3(b)(1), unless the authorization is terminated or revoked.  Performed at [facility], [address] [place], [place] [address]      Scheduled Meds: . Chlorhexidine Gluconate Cloth  6 each Topical Daily  . citalopram  10 mg Oral Daily  . enoxaparin (LOVENOX) injection  40 mg Subcutaneous Q24H  . feeding supplement  237 mL Oral TID BM  . ferrous sulfate  325 mg Oral Q breakfast  . insulin aspart  0-15 Units Subcutaneous TID WC  . insulin aspart  0-5 Units Subcutaneous QHS  . insulin detemir  10 Units Subcutaneous Q2200  . multivitamin with minerals  1 tablet Oral Daily  . polyethylene glycol  17 g Oral Daily  . tamsulosin  0.8 mg Oral Daily  . thiamine  100 mg Oral Daily  . vitamin B-12  1,000 mcg Oral Daily    Assessment/Plan:  1. Acute metabolic encephalopathy from  findings on MRI of the brain.  MRI of the brain shows innumerable chronic microhemorrhages suggesting amyloid angiopathy.  Patient also has a large meningioma and bilateral subdural hematomas.  Patient is very simple with his answers of questions with yes or no answers.  Does not elaborate much or inquire questions about his health.  The patient definitely will not be able to take care of himself at home alone. 2. Type 2 diabetes mellitus.  Poorly controlled with hemoglobin A1c elevated at 10.8.  Patient on detemir insulin at night and sliding scale. 3. Functional decline.  Both PT and OT still recommending rehab.  The patient will not be able to take care of himself at home.  Continue strengthening while here. 4. Substance abuse with cocaine prior to coming into the hospital 5. History of hepatitis C 6. BPH on Flomax 7. Depression on Celexa 8. Moderate malnutrition.      Code Status:     Code Status Orders  (  From admission, onward)         Start     Ordered   05/14/20 2242  Full code  Continuous        05/14/20 2244        Code Status History    Date Active Date Inactive Code Status Order ID Comments User Context   10/10/2016 0947 10/14/2016 1918 Full Code [id number]  Milagros Loll, MD ED   Advance Care Planning Activity     Family Communication: Spoke with daughter on the phone Disposition Plan: Status is: Inpatient  Dispo: The patient is from: Home              Anticipated d/c is to: Rehab              Anticipated d/c date is: Whenever insurance company approves rehab I will discharge him.              Patient currently awaiting insurance authorization for rehab.  Time spent: 26 minutes  [employer]

## 2020-06-05 LAB — MRSA PCR SCREENING: MRSA by PCR: POSITIVE — AB

## 2020-06-05 LAB — GLUCOSE, CAPILLARY
Glucose-Capillary: 331 mg/dL — ABNORMAL HIGH (ref 70–99)
Glucose-Capillary: 143 mg/dL — ABNORMAL HIGH (ref 70–99)
Glucose-Capillary: 226 mg/dL — ABNORMAL HIGH (ref 70–99)
Glucose-Capillary: 276 mg/dL — ABNORMAL HIGH (ref 70–99)

## 2020-06-05 MED ORDER — MUPIROCIN 2 % EX OINT
TOPICAL_OINTMENT | Freq: Two times a day (BID) | CUTANEOUS | Status: AC
  Filled 2020-06-05 (×2): qty 22

## 2020-06-05 NOTE — Progress Notes (Signed)
Occupational Therapy Treatment Patient Details Name: Lance [name] MRN: [id number] DOB: 1946-05-31 Today's Date: 06/05/2020    History of present illness Pt is a 74yo M admitted to [facility] on 05/14/20 because of concerns for AMS; friends found pt unresponsive on the ground and called EMS. Per EMS, CBG in 500's. Blood work concerning for leukocytosis and lactic acidosis. 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 PMH significant includes: T2DM, Hep C, schizophrenia, and sepsis.   OT comments  Lance [name] was seen for OT treatment on this date. Upon arrival to room pt easily awoken and agreeable to tx. CGA + RW for ADL t/f. SBA standing face washing, tolerates ~2 min prior to requesting to complete in sitting 2/2 fatigue. SETUP + SUPERVISION seated bathing and don/doff gown and socks seated EOB. Pt demonstrates improved mildly safety awareness this date however continues to require VCs t/o to attend to tasks, significantly limited planning tasks. Pt making good progress toward goals. Pt continues to benefit from skilled OT services to maximize return to PLOF and minimize risk of future falls, injury, caregiver burden, and readmission. Will continue to follow POC. Discharge recommendation remains appropriate.    Follow Up Recommendations  SNF;Supervision/Assistance - 24 hour    Equipment Recommendations  Other (comment) (TBD)    Recommendations for Other Services      Precautions / Restrictions Precautions Precautions: Fall Restrictions Weight Bearing Restrictions: No       Mobility Bed Mobility Overal bed mobility: Needs Assistance Bed Mobility: Supine to Sit;Sit to Supine Rolling: Supervision   Supine to sit: Supervision Sit to supine: Min guard      Transfers Overall transfer level: Needs assistance Equipment used: Rolling walker (2  wheeled) Transfers: Sit to/from Stand Sit to Stand: Min guard         General transfer comment: CGA for safety    Balance Overall balance assessment: Needs assistance Sitting-balance support: Feet supported;No upper extremity supported Sitting balance-Leahy Scale: Good     Standing balance support: During functional activity;Bilateral upper extremity supported Standing balance-Leahy Scale: Fair                             ADL either performed or assessed with clinical judgement   ADL Overall ADL's : Needs assistance/impaired                                       General ADL Comments: CGA + RW for ADL t/f. SBA standing face washing, tolerates ~2 min prior to completing in sitting. SETUP + SUPERVISION seated bathing and don/doff gown and socks seated EOB.               Cognition Arousal/Alertness: Awake/alert Behavior During Therapy: Flat affect Overall Cognitive Status: Impaired/Different from baseline Area of Impairment: Orientation;Attention;Following commands;Safety/judgement;Awareness;Problem solving                 Orientation Level: Disoriented to;Situation;Place Current Attention Level: Alternating   Following Commands: Follows one step commands consistently Safety/Judgement: Decreased awareness of deficits;Decreased awareness of safety   Problem Solving: Slow processing;Decreased initiation;Difficulty sequencing;Requires verbal cues General Comments: Pt requires increased time and VCs t/o to attend to task.        Exercises Exercises: Other exercises Other Exercises  Other Exercises: Pt educated re: d/c recs, safe t/f techniques Other Exercises: LBD, UBD, bathing, grooming, sup<>sit, sit<>stand, sitting/standing balance/tolerance           Pertinent Vitals/ Pain       Pain Assessment: Faces Faces Pain Scale: Hurts a little bit Pain Location: abdomen Pain Descriptors / Indicators: Dull;Discomfort Pain  Intervention(s): Limited activity within patient's tolerance;Repositioned         Frequency  Min 2X/week        Progress Toward Goals  OT Goals(current goals can now be found in the care plan section)  Progress towards OT goals: Progressing toward goals  Acute Rehab OT Goals Patient Stated Goal: none stated OT Goal Formulation: With patient Time For Goal Achievement: 06/09/20 Potential to Achieve Goals: Good ADL Goals Pt Will Perform Grooming: with supervision Pt Will Transfer to Toilet: with supervision Pt Will Perform Toileting - Clothing Manipulation and hygiene: with supervision Pt Will Perform Tub/Shower Transfer: with supervision  Plan Discharge plan remains appropriate;Frequency remains appropriate       AM-PAC OT "6 Clicks" Daily Activity     Outcome Measure   Help from another person eating meals?: A Little Help from another person taking care of personal grooming?: A Little Help from another person toileting, which includes using toliet, bedpan, or urinal?: A Little Help from another person bathing (including washing, rinsing, drying)?: A Little Help from another person to put on and taking off regular upper body clothing?: A Little Help from another person to put on and taking off regular lower body clothing?: A Little 6 Click Score: 18    End of Session Equipment Utilized During Treatment: Rolling walker  OT Visit Diagnosis: Unsteadiness on feet (R26.81);Repeated falls (R29.6);Muscle weakness (generalized) (M62.81)   Activity Tolerance Patient tolerated treatment well   Patient Left in bed;with call bell/phone within reach;with bed alarm set   Nurse Communication          Time: 4782-9562 OT Time Calculation (min): 27 min  Charges: OT General Charges $OT Visit: 1 Visit OT Treatments $Self Care/Home Management : 23-37 mins  Kathie Dike, M.S. OTR/L  06/05/20, 1:14 PM  ascom [phone]

## 2020-06-05 NOTE — Progress Notes (Signed)
Physical Therapy Treatment Patient Details Name: Lance [name] MRN: [id number] DOB: Dec 15, 1945 Today's Date: 06/05/2020    History of Present Illness Pt is a 74yo M admitted to [facility] on 05/14/20 because of concerns for AMS; friends found pt unresponsive on the ground and called EMS. Per EMS, CBG in 500's. Blood work concerning for leukocytosis and lactic acidosis. 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 PMH significant includes: T2DM, Hep C, schizophrenia, and sepsis.    PT Comments    Pt resting earlier today, therapist returned, pt requesting graham crackers.  Pt transferred supine to sit at EOB with SBA. Pt noted to be incontinent of urine, gown changed. Pt able to transfer sit to stand from low bed with MinA, vc's for decreased impulsivity and increased safety awareness. Short distance gait in room with MinA/HHA. Pt left sitting up in recliner with chair alarm on, all needs met. Due to pt's impaired cognitive status and remaining increased fall risk, pt will continue to be a good candidate for SNF placement upon d/c.   Follow Up Recommendations  SNF;Supervision/Assistance - 24 hour;Supervision for mobility/OOB     Equipment Recommendations  None recommended by PT    Recommendations for Other Services       Precautions / Restrictions Precautions Precautions: Fall Restrictions Weight Bearing Restrictions: No    Mobility  Bed Mobility Overal bed mobility: Needs Assistance Bed Mobility: Supine to Sit;Sit to Supine Rolling: Supervision   Supine to sit: Supervision Sit to supine: Min guard      Transfers Overall transfer level: Needs assistance Equipment used: 1 person hand held assist Transfers: Sit to/from Stand Sit to Stand: Min guard Stand pivot transfers: Min assist       General transfer comment: CGA for  safety  Ambulation/Gait Ambulation/Gait assistance: Min guard;Min assist           General Gait Details: Short distance with MinA/HHA, impulsive to transfer to bedside chair   Stairs             Wheelchair Mobility    Modified Rankin (Stroke Patients Only)       Balance Overall balance assessment: Needs assistance Sitting-balance support: Feet supported;No upper extremity supported Sitting balance-Leahy Scale: Good     Standing balance support: During functional activity;Bilateral upper extremity supported Standing balance-Leahy Scale: Fair                              Cognition Arousal/Alertness: Awake/alert Behavior During Therapy: WFL for tasks assessed/performed Overall Cognitive Status: Impaired/Different from baseline Area of Impairment: Orientation;Attention;Following commands;Safety/judgement;Awareness;Problem solving                 Orientation Level: Disoriented to;Situation;Place Current Attention Level: Alternating   Following Commands: Follows one step commands consistently Safety/Judgement: Decreased awareness of safety Awareness: Emergent Problem Solving: Slow processing;Decreased initiation;Difficulty sequencing;Requires verbal cues General Comments: Pt requires increased time and VCs t/o to attend to task.      Exercises Other Exercises Other Exercises: Pt educated re: d/c recs, safe t/f techniques Other Exercises: LBD, UBD, bathing, grooming, sup<>sit, sit<>stand, sitting/standing balance/tolerance    General Comments        Pertinent Vitals/Pain Pain Assessment: No/denies pain Faces Pain Scale: Hurts a little bit Pain Location: abdomen Pain Descriptors / Indicators: Dull;Discomfort Pain Intervention(s): Limited activity within patient's tolerance;Repositioned  Home Living                      Prior Function            PT Goals (current goals can now be found in the care plan section) Acute  Rehab PT Goals Patient Stated Goal: none stated    Frequency    Min 2X/week      PT Plan Current plan remains appropriate    Co-evaluation              AM-PAC PT "6 Clicks" Mobility   Outcome Measure  Help needed turning from your back to your side while in a flat bed without using bedrails?: A Little Help needed moving from lying on your back to sitting on the side of a flat bed without using bedrails?: A Little Help needed moving to and from a bed to a chair (including a wheelchair)?: A Little Help needed standing up from a chair using your arms (e.g., wheelchair or bedside chair)?: A Little Help needed to walk in hospital room?: A Little Help needed climbing 3-5 steps with a railing? : A Little 6 Click Score: 18    End of Session Equipment Utilized During Treatment: Gait belt Activity Tolerance: Patient tolerated treatment well Patient left: with call bell/phone within reach;in chair;with chair alarm set;Other (comment)   PT Visit Diagnosis: Unsteadiness on feet (R26.81);Difficulty in walking, not elsewhere classified (R26.2)     Time: 3202-3343 PT Time Calculation (min) (ACUTE ONLY): 26 min  Charges:  $Therapeutic Activity: 23-37 mins                     Mikel Cella, PTA   Lance [name] 06/05/2020, 4:34 PM

## 2020-06-05 NOTE — Progress Notes (Signed)
Patient ID: Lance [name], male   DOB: [birth year], 74 y.o.   MRN: [id number] [facility] PROGRESS NOTE  BRENON PENNELL [id number] DOB: March 13, 1946 DOA: 05/14/2020 PCP: Theotis Burrow, MD  HPI/Subjective: Patient answers some yes or no questions.  Today's the first day he inquired about things regarding to himself.  He asked for a call more brush to brush his hair.  He asked when he can get out of the hospital.  Mental status better than when he came in but not well enough to take care of himself.  Came in with altered mental status.  Objective: Vitals:   06/05/20 0850 06/05/20 1215  BP: 133/78 118/83  Pulse: 71 94  Resp: 18 18  Temp: 98 F (36.7 C) 98.2 F (36.8 C)  SpO2: 100% 98%    Intake/Output Summary (Last 24 hours) at 06/05/2020 1434 Last data filed at 06/05/2020 1433 Gross per 24 hour  Intake 480 ml  Output 125 ml  Net 355 ml   Filed Weights   05/24/20 0446 05/26/20 0342 05/27/20 0322  Weight: 74.1 kg 74.7 kg 77.5 kg    ROS: Review of Systems  Respiratory: Negative for cough and shortness of breath.   Cardiovascular: Negative for chest pain.  Gastrointestinal: Negative for abdominal pain, nausea and vomiting.   Exam: Physical Exam HENT:     Head: Normocephalic.     Mouth/Throat:     Pharynx: No oropharyngeal exudate.  Eyes:     General: Lids are normal.     Conjunctiva/sclera: Conjunctivae normal.  Cardiovascular:     Rate and Rhythm: Normal rate and regular rhythm.     Heart sounds: Normal heart sounds, S1 normal and S2 normal.  Pulmonary:     Breath sounds: No decreased breath sounds, wheezing, rhonchi or rales.  Abdominal:     Palpations: Abdomen is soft.     Tenderness: There is no abdominal tenderness.  Musculoskeletal:     Right lower leg: No swelling.     Left lower leg: No swelling.  Skin:    General: Skin is warm.     Findings: No rash.  Neurological:     Mental Status: He is alert.     Comments: Today he inquired  about a couple things where before he was just answering yes or no questions.       Data Reviewed: Basic Metabolic Panel: Recent Labs  Lab 06/01/20 0441  NA 136  K 4.7  CL 102  CO2 29  GLUCOSE 133*  BUN 18  CREATININE 1.03  CALCIUM 8.8*   CBC: Recent Labs  Lab 06/01/20 0441 06/04/20 0447  WBC 9.0 10.6*  HGB 11.5* 11.3*  HCT 34.6* 34.0*  MCV 91.5 91.4  PLT 265 280    CBG: Recent Labs  Lab 06/04/20 1226 06/04/20 1632 06/04/20 2142 06/05/20 0851 06/05/20 1216  GLUCAP 249* 166* 288* 331* 143*    Recent Results (from the past 240 hour(s))  Resp Panel by RT-PCR (Flu A&B, Covid) Nasopharyngeal Swab     Status: None   Collection Time: 06/01/20 10:25 AM   Specimen: Nasopharyngeal Swab; Nasopharyngeal(NP) swabs in vial transport medium  Result Value Ref Range Status   SARS Coronavirus 2 by RT PCR NEGATIVE NEGATIVE Final    Comment: (NOTE) SARS-CoV-2 target nucleic acids are NOT DETECTED.  The SARS-CoV-2 RNA is generally detectable in upper respiratory specimens during the acute phase of infection. The lowest concentration of SARS-CoV-2 viral copies this assay can detect is 138 copies/mL.  A negative result does not preclude SARS-Cov-2 infection and should not be used as the sole basis for treatment or other patient management decisions. A negative result may occur with  improper specimen collection/handling, submission of specimen other than nasopharyngeal swab, presence of viral mutation(s) within the areas targeted by this assay, and inadequate number of viral copies(<138 copies/mL). A negative result must be combined with clinical observations, patient history, and epidemiological information. The expected result is Negative.  Fact Sheet for Patients:  BloggerCourse.com  Fact Sheet for Healthcare Providers:  SeriousBroker.it  This test is no t yet approved or cleared by the [place] FDA and  has been  authorized for detection and/or diagnosis of SARS-CoV-2 by FDA under an Emergency Use Authorization (EUA). This EUA will remain  in effect (meaning this test can be used) for the duration of the COVID-19 declaration under Section 564(b)(1) of the Act, 21 U.S.C.section 360bbb-3(b)(1), unless the authorization is terminated  or revoked sooner.       Influenza A by PCR NEGATIVE NEGATIVE Final   Influenza B by PCR NEGATIVE NEGATIVE Final    Comment: (NOTE) The Xpert Xpress SARS-CoV-2/FLU/RSV plus assay is intended as an aid in the diagnosis of influenza from Nasopharyngeal swab specimens and should not be used as a sole basis for treatment. Nasal washings and aspirates are unacceptable for Xpert Xpress SARS-CoV-2/FLU/RSV testing.  Fact Sheet for Patients: BloggerCourse.com  Fact Sheet for Healthcare Providers: SeriousBroker.it  This test is not yet approved or cleared by the [place] FDA and has been authorized for detection and/or diagnosis of SARS-CoV-2 by FDA under an Emergency Use Authorization (EUA). This EUA will remain in effect (meaning this test can be used) for the duration of the COVID-19 declaration under Section 564(b)(1) of the Act, 21 U.S.C. section 360bbb-3(b)(1), unless the authorization is terminated or revoked.  Performed at [facility], [address] [place], [place] [address]   MRSA PCR Screening     Status: Abnormal   Collection Time: 06/04/20  6:37 AM   Specimen: Nasal Mucosa; Nasopharyngeal  Result Value Ref Range Status   MRSA by PCR POSITIVE (A) NEGATIVE Final    Comment:        The GeneXpert MRSA Assay (FDA approved for NASAL specimens only), is one component of a comprehensive MRSA colonization surveillance program. It is not intended to diagnose MRSA infection nor to guide or monitor treatment for MRSA infections. RESULT CALLED TO, READ BACK BY AND VERIFIED WITH:  Ander Slade [facility]  AT 0800 06/05/20 SDR Performed at [facility], [address] [place], [place] [address]       Scheduled Meds: . Chlorhexidine Gluconate Cloth  6 each Topical Daily  . citalopram  10 mg Oral Daily  . enoxaparin (LOVENOX) injection  40 mg Subcutaneous Q24H  . feeding supplement  237 mL Oral TID BM  . ferrous sulfate  325 mg Oral Q breakfast  . insulin aspart  0-15 Units Subcutaneous TID WC  . insulin aspart  0-5 Units Subcutaneous QHS  . insulin detemir  10 Units Subcutaneous Q2200  . multivitamin with minerals  1 tablet Oral Daily  . mupirocin ointment   Nasal BID  . polyethylene glycol  17 g Oral Daily  . tamsulosin  0.8 mg Oral Daily  . thiamine  100 mg Oral Daily  . vitamin B-12  1,000 mcg Oral Daily    Assessment/Plan:  1. Acute metabolic encephalopathy.  Has improved since coming into the hospital but he  is not well enough to go home by himself at this point.  MRI of the brain shows innumerable chronic microhemorrhages suggesting amyloid angiopathy, large meningioma and bilateral subdural hematomas. 2. Type 2 diabetes mellitus poorly controlled with hemoglobin A1c elevated at 10.8.  Patient on detemir insulin and sliding scale. 3. Functional decline.  Unable to take care of himself.  Continue strengthening with physical therapy and PT and OT while here. 4. Substance abuse with cocaine prior to coming into the hospital 5. History of hepatitis C 6. BPH.  Continue Flomax 7. Depression.  Continue Celexa 8. Moderate malnutrition.        Code Status:     Code Status Orders  (From admission, onward)         Start     Ordered   05/14/20 2242  Full code  Continuous        05/14/20 2244        Code Status History    Date Active Date Inactive Code Status Order ID Comments User Context   10/10/2016 0947 10/14/2016 1918 Full Code [id number]  Hillary Bow, MD ED   Advance Care Planning Activity     Family Communication: Spoke with daughter  yesterday Disposition Plan: Status is: Inpatient   Dispo: The patient is from: Home              Anticipated d/c is to: Whenever insurance authorizes rehab              Anticipated d/c date is: Whenever insurance authorizes rehab.  I have been waiting for insurance authorization every day I have been rounding on him for the past 6 days.              Patient currently not well enough to go home by himself.  Stable to go to rehab.  Time spent: 25 minutes  [place]

## 2020-06-05 NOTE — Plan of Care (Signed)
  Problem: Fluid Volume: Goal: Hemodynamic stability will improve Outcome: Progressing   Problem: Clinical Measurements: Goal: Diagnostic test results will improve Outcome: Progressing Goal: Signs and symptoms of infection will decrease Outcome: Progressing   Problem: Respiratory: Goal: Ability to maintain adequate ventilation will improve Outcome: Progressing   Problem: Education: Goal: Knowledge of General Education information will improve Description: Including pain rating scale, medication(s)/side effects and non-pharmacologic comfort measures Outcome: Progressing   Problem: Clinical Measurements: Goal: Ability to maintain clinical measurements within normal limits will improve Outcome: Progressing Goal: Will remain free from infection Outcome: Progressing Goal: Diagnostic test results will improve Outcome: Progressing Goal: Respiratory complications will improve Outcome: Progressing Goal: Cardiovascular complication will be avoided Outcome: Progressing   Problem: Activity: Goal: Risk for activity intolerance will decrease Outcome: Progressing   Problem: Nutrition: Goal: Adequate nutrition will be maintained Outcome: Progressing   Problem: Coping: Goal: Level of anxiety will decrease Outcome: Progressing   Problem: Elimination: Goal: Will not experience complications related to bowel motility Outcome: Progressing Goal: Will not experience complications related to urinary retention Outcome: Progressing   Problem: Pain Managment: Goal: General experience of comfort will improve Outcome: Progressing   Problem: Safety: Goal: Ability to remain free from injury will improve Outcome: Progressing   Problem: Skin Integrity: Goal: Risk for impaired skin integrity will decrease Outcome: Progressing   

## 2020-06-05 NOTE — TOC Progression Note (Signed)
Transition of Care [facility]) - Progression Note    Patient Details  Name: VITALY WANAT MRN: [id number] Date of Birth: 1946-02-01  Transition of [facility]) CM/SW Contact  Allayne Butcher, RN Phone Number: 06/05/2020, 12:45 PM  Clinical Narrative:     RNCM called to follow up on [employer] authorization.  Teena at [facility] has not heard from [facility] but will be calling to check with them and get back with this RNCM.   Expected Discharge Plan: Skilled Nursing Facility Barriers to Discharge: Continued Medical Work up  Expected Discharge Plan and Services Expected Discharge Plan: Skilled Nursing Facility In-house Referral: Clinical Social Work   Post Acute Care Choice: Skilled Nursing Facility Living arrangements for the past 2 months: Single Family Home                                       Social Determinants of Health (SDOH) Interventions    Readmission Risk Interventions No flowsheet data found.

## 2020-06-05 NOTE — Care Management Important Message (Signed)
Important Message  Patient Details  Name: Lance [name] MRN: [id number] Date of Birth: 1945-12-10   Medicare Important Message Given:  Yes     [name] 06/05/2020, 10:59 AM

## 2020-06-06 DIAGNOSIS — E1165 Type 2 diabetes mellitus with hyperglycemia: Secondary | ICD-10-CM | POA: Diagnosis not present

## 2020-06-06 DIAGNOSIS — Z5320 Procedure and treatment not carried out because of patient's decision for unspecified reasons: Secondary | ICD-10-CM | POA: Diagnosis not present

## 2020-06-06 DIAGNOSIS — I618 Other nontraumatic intracerebral hemorrhage: Secondary | ICD-10-CM | POA: Diagnosis not present

## 2020-06-06 DIAGNOSIS — G9341 Metabolic encephalopathy: Secondary | ICD-10-CM | POA: Diagnosis not present

## 2020-06-06 LAB — GLUCOSE, CAPILLARY
Glucose-Capillary: 170 mg/dL — ABNORMAL HIGH (ref 70–99)
Glucose-Capillary: 212 mg/dL — ABNORMAL HIGH (ref 70–99)
Glucose-Capillary: 157 mg/dL — ABNORMAL HIGH (ref 70–99)
Glucose-Capillary: 318 mg/dL — ABNORMAL HIGH (ref 70–99)

## 2020-06-06 NOTE — Plan of Care (Signed)
  Problem: Fluid Volume: Goal: Hemodynamic stability will improve Outcome: Progressing   Problem: Clinical Measurements: Goal: Diagnostic test results will improve Outcome: Progressing Goal: Signs and symptoms of infection will decrease Outcome: Progressing   Problem: Respiratory: Goal: Ability to maintain adequate ventilation will improve Outcome: Progressing   Problem: Education: Goal: Knowledge of General Education information will improve Description: Including pain rating scale, medication(s)/side effects and non-pharmacologic comfort measures Outcome: Progressing   Problem: Health Behavior/Discharge Planning: Goal: Ability to manage health-related needs will improve Outcome: Progressing   Problem: Clinical Measurements: Goal: Ability to maintain clinical measurements within normal limits will improve Outcome: Progressing Goal: Will remain free from infection Outcome: Progressing Goal: Diagnostic test results will improve Outcome: Progressing Goal: Respiratory complications will improve Outcome: Progressing Goal: Cardiovascular complication will be avoided Outcome: Progressing   Problem: Activity: Goal: Risk for activity intolerance will decrease Outcome: Progressing   Problem: Nutrition: Goal: Adequate nutrition will be maintained Outcome: Progressing   Problem: Coping: Goal: Level of anxiety will decrease Outcome: Progressing   Problem: Elimination: Goal: Will not experience complications related to bowel motility Outcome: Progressing Goal: Will not experience complications related to urinary retention Outcome: Progressing   Problem: Pain Managment: Goal: General experience of comfort will improve Outcome: Progressing   Problem: Safety: Goal: Ability to remain free from injury will improve Outcome: Progressing   

## 2020-06-06 NOTE — Progress Notes (Signed)
Inpatient Diabetes Program Recommendations  AACE/ADA: New Consensus Statement on Inpatient Glycemic Control   Target Ranges:  Prepandial:   less than 140 mg/dL      Peak postprandial:   less than 180 mg/dL (1-2 hours)      Critically ill patients:  140 - 180 mg/dL   Results for Lance [name], Lance [name] (MRN [id number]) as of 06/06/2020 10:35  Ref. Range 06/05/2020 08:51 06/05/2020 12:16 06/05/2020 16:43 06/05/2020 21:08 06/06/2020 07:59  Glucose-Capillary Latest Ref Range: 70 - 99 mg/dL 269 (H) 485 (H) 462 (H) 276 (H) 170 (H)   Review of Glycemic Control  Current orders for Inpatient glycemic control: Levemir 10 units QHS, Novolog 0-15 units TID with meals, Novolog 0-5 units QHS  Inpatient Diabetes Program Recommendations:    Insulin: If post prandial glucose continues to be consistently elevated, please consider ordering Novolog 3 units TID with meals for meal coverage if patient eats at least 50% of meals.  Thanks, Orlando Penner, RN, MSN, CDE Diabetes Coordinator Inpatient Diabetes Program [phone] (Team Pager from 8am to 5pm)

## 2020-06-06 NOTE — Progress Notes (Signed)
Patient ID: Lance [name], male   DOB: 1945-09-04, 74 y.o.   MRN: [id number] [facility] PROGRESS NOTE  Lance [name] [id number] DOB: [birth year] DOA: 05/14/2020 PCP: Theotis Burrow, MD  HPI/Subjective: Patient feels okay.  Offers no complaints.  Patient does not recall if he spoke with his daughter yesterday.  Patient not able to take care of himself.  States he did get a comb for his hair.  Objective: Vitals:   06/06/20 0759 06/06/20 1150  BP: 114/75 117/64  Pulse: 81 67  Resp: 16 16  Temp: 98.3 F (36.8 C) 98 F (36.7 C)  SpO2: 98% 100%    Intake/Output Summary (Last 24 hours) at 06/06/2020 1204 Last data filed at 06/06/2020 1000 Gross per 24 hour  Intake 240 ml  Output 200 ml  Net 40 ml   Filed Weights   05/24/20 0446 05/26/20 0342 05/27/20 0322  Weight: 74.1 kg 74.7 kg 77.5 kg    ROS: Review of Systems  Respiratory: Negative for shortness of breath.   Cardiovascular: Negative for chest pain.  Gastrointestinal: Negative for abdominal pain, nausea and vomiting.   Exam: Physical Exam HENT:     Head: Normocephalic.     Mouth/Throat:     Pharynx: No oropharyngeal exudate.  Eyes:     General: Lids are normal.     Conjunctiva/sclera: Conjunctivae normal.     Pupils: Pupils are equal, round, and reactive to light.  Cardiovascular:     Rate and Rhythm: Normal rate and regular rhythm.     Heart sounds: Normal heart sounds, S1 normal and S2 normal.  Pulmonary:     Breath sounds: No decreased breath sounds, wheezing, rhonchi or rales.  Abdominal:     Palpations: Abdomen is soft.     Tenderness: There is no abdominal tenderness.  Musculoskeletal:     Right lower leg: No swelling.     Left lower leg: No swelling.  Skin:    General: Skin is warm.     Findings: No rash.  Neurological:     Mental Status: He is alert.     Comments: Answers yes or no questions       Data Reviewed: Basic Metabolic Panel: Recent Labs  Lab 06/01/20 0441   NA 136  K 4.7  CL 102  CO2 29  GLUCOSE 133*  BUN 18  CREATININE 1.03  CALCIUM 8.8*   CBC: Recent Labs  Lab 06/01/20 0441 06/04/20 0447  WBC 9.0 10.6*  HGB 11.5* 11.3*  HCT 34.6* 34.0*  MCV 91.5 91.4  PLT 265 280   CBG: Recent Labs  Lab 06/05/20 1216 06/05/20 1643 06/05/20 2108 06/06/20 0759 06/06/20 1152  GLUCAP 143* 226* 276* 170* 212*    Recent Results (from the past 240 hour(s))  Resp Panel by RT-PCR (Flu A&B, Covid) Nasopharyngeal Swab     Status: None   Collection Time: 06/01/20 10:25 AM   Specimen: Nasopharyngeal Swab; Nasopharyngeal(NP) swabs in vial transport medium  Result Value Ref Range Status   SARS Coronavirus 2 by RT PCR NEGATIVE NEGATIVE Final    Comment: (NOTE) SARS-CoV-2 target nucleic acids are NOT DETECTED.  The SARS-CoV-2 RNA is generally detectable in upper respiratory specimens during the acute phase of infection. The lowest concentration of SARS-CoV-2 viral copies this assay can detect is 138 copies/mL. A negative result does not preclude SARS-Cov-2 infection and should not be used as the sole basis for treatment or other patient management decisions. A negative result may occur with  improper specimen collection/handling, submission of specimen other than nasopharyngeal swab, presence of viral mutation(s) within the areas targeted by this assay, and inadequate number of viral copies(<138 copies/mL). A negative result must be combined with clinical observations, patient history, and epidemiological information. The expected result is Negative.  Fact Sheet for Patients:  BloggerCourse.com  Fact Sheet for Healthcare Providers:  SeriousBroker.it  This test is no t yet approved or cleared by the [place] FDA and  has been authorized for detection and/or diagnosis of SARS-CoV-2 by FDA under an Emergency Use Authorization (EUA). This EUA will remain  in effect (meaning this test can  be used) for the duration of the COVID-19 declaration under Section 564(b)(1) of the Act, 21 U.S.C.section 360bbb-3(b)(1), unless the authorization is terminated  or revoked sooner.       Influenza A by PCR NEGATIVE NEGATIVE Final   Influenza B by PCR NEGATIVE NEGATIVE Final    Comment: (NOTE) The Xpert Xpress SARS-CoV-2/FLU/RSV plus assay is intended as an aid in the diagnosis of influenza from Nasopharyngeal swab specimens and should not be used as a sole basis for treatment. Nasal washings and aspirates are unacceptable for Xpert Xpress SARS-CoV-2/FLU/RSV testing.  Fact Sheet for Patients: BloggerCourse.com  Fact Sheet for Healthcare Providers: SeriousBroker.it  This test is not yet approved or cleared by the [place] FDA and has been authorized for detection and/or diagnosis of SARS-CoV-2 by FDA under an Emergency Use Authorization (EUA). This EUA will remain in effect (meaning this test can be used) for the duration of the COVID-19 declaration under Section 564(b)(1) of the Act, 21 U.S.C. section 360bbb-3(b)(1), unless the authorization is terminated or revoked.  Performed at [facility], [address] [place], [place] [address]   MRSA PCR Screening     Status: Abnormal   Collection Time: 06/04/20  6:37 AM   Specimen: Nasal Mucosa; Nasopharyngeal  Result Value Ref Range Status   MRSA by PCR POSITIVE (A) NEGATIVE Final    Comment:        The GeneXpert MRSA Assay (FDA approved for NASAL specimens only), is one component of a comprehensive MRSA colonization surveillance program. It is not intended to diagnose MRSA infection nor to guide or monitor treatment for MRSA infections. RESULT CALLED TO, READ BACK BY AND VERIFIED WITH:  Ander Slade [facility] AT 0800 06/05/20 SDR Performed at [facility], [address] [place], [place] [address]      Scheduled Meds: . Chlorhexidine Gluconate Cloth  6  each Topical Daily  . citalopram  10 mg Oral Daily  . enoxaparin (LOVENOX) injection  40 mg Subcutaneous Q24H  . feeding supplement  237 mL Oral TID BM  . ferrous sulfate  325 mg Oral Q breakfast  . insulin aspart  0-15 Units Subcutaneous TID WC  . insulin aspart  0-5 Units Subcutaneous QHS  . insulin detemir  10 Units Subcutaneous Q2200  . multivitamin with minerals  1 tablet Oral Daily  . mupirocin ointment   Nasal BID  . polyethylene glycol  17 g Oral Daily  . tamsulosin  0.8 mg Oral Daily  . thiamine  100 mg Oral Daily  . vitamin B-12  1,000 mcg Oral Daily     Assessment/Plan:  1. Acute metabolic encephalopathy.  The patient still not well enough to go home by himself at this point.  Insurance company after 7 days has rejected rehab approval.  The patient's daughter will set up the appeal.  MRI of the brain previously shows  innumerable chronic microhemorrhages suggesting amyloid angiopathy, a large meningioma and bilateral subdural hematomas.  Seen by neurosurgery on this hospital stay. 2. Type 2 diabetes mellitus poorly controlled with hemoglobin A1c elevated at 10.8.  Continue detemir insulin and sliding scale.  I do not think patient will be able to inject his own insulin at home. 3. Functional decline.  Physical therapy and occupational therapy recommending rehab.  Patient unable to take care of himself with activities of daily living and living by himself. 4. Substance abuse with cocaine prior to coming into the hospital 5. History of hepatitis C 6. BPH.  On Flomax 7. Depression.  On Celexa. 8. Moderate malnutrition  Code Status:     Code Status Orders  (From admission, onward)         Start     Ordered   05/14/20 2242  Full code  Continuous        05/14/20 2244        Code Status History    Date Active Date Inactive Code Status Order ID Comments User Context   10/10/2016 0947 10/14/2016 1918 Full Code [id number]  Hillary Bow, MD ED   Advance Care Planning Activity      Family Communication: Spoke with the patient's daughter on the phone Disposition Plan: Status is: Inpatient  Dispo: The patient is from: Home              Anticipated d/c is to: [facility] denied rehab on June 06, 2020.  Daughter to undergo appeal process.              Anticipated d/c date is: After the appeal process completes.              Patient currently unable to take care of himself at home alone.  Daughter appealing the decision on rejecting rehab by the insurance company.  Time spent: 26 minutes  [employer]

## 2020-06-06 NOTE — TOC Progression Note (Addendum)
Transition of Care [facility]) - Progression Note    Patient Details  Name: Lance [name] MRN: [id number] Date of Birth: 1945/09/29  [facility] CM/SW Contact  Margarito Liner, LCSW Phone Number: 06/06/2020, 11:19 AM  Clinical Narrative: Left message for SNF admissions coordinator to check insurance authorization status.    11:59 am: Insurance authorization denied. Per SNF admissions coordinator, Monia Pouch does not think he meets criteria for SNF placement. Asked admissions coordinator to get appeal information. CSW called and updated daughter. MD aware.  Expected Discharge Plan: Skilled Nursing Facility Barriers to Discharge: Continued Medical Work up  Expected Discharge Plan and Services Expected Discharge Plan: Skilled Nursing Facility In-house Referral: Clinical Social Work   Post Acute Care Choice: Skilled Nursing Facility Living arrangements for the past 2 months: Single Family Home                                       Social Determinants of Health (SDOH) Interventions    Readmission Risk Interventions No flowsheet data found.

## 2020-06-07 DIAGNOSIS — R410 Disorientation, unspecified: Secondary | ICD-10-CM | POA: Diagnosis not present

## 2020-06-07 LAB — BASIC METABOLIC PANEL
Anion gap: 8 (ref 5–15)
BUN: 17 mg/dL (ref 8–23)
CO2: 27 mmol/L (ref 22–32)
Calcium: 8.8 mg/dL — ABNORMAL LOW (ref 8.9–10.3)
Chloride: 100 mmol/L (ref 98–111)
Creatinine, Ser: 0.8 mg/dL (ref 0.61–1.24)
GFR, Estimated: >60 mL/min (ref >60)
Glucose, Bld: 164 mg/dL — ABNORMAL HIGH (ref 70–99)
Potassium: 4.1 mmol/L (ref 3.5–5.1)
Sodium: 135 mmol/L (ref 135–145)

## 2020-06-07 LAB — CBC
HCT: 33.3 % — ABNORMAL LOW (ref 39.0–52.0)
Hemoglobin: 11.3 g/dL — ABNORMAL LOW (ref 13.0–17.0)
MCH: 30.5 pg (ref 26.0–34.0)
MCHC: 33.9 g/dL (ref 30.0–36.0)
MCV: 90 fL (ref 80.0–100.0)
Platelets: 285 10*3/uL (ref 150–400)
RBC: 3.7 MIL/uL — ABNORMAL LOW (ref 4.22–5.81)
RDW: 12.2 % (ref 11.5–15.5)
WBC: 8.5 10*3/uL (ref 4.0–10.5)
nRBC: 0 % (ref 0.0–0.2)

## 2020-06-07 LAB — GLUCOSE, CAPILLARY
Glucose-Capillary: 150 mg/dL — ABNORMAL HIGH (ref 70–99)
Glucose-Capillary: 216 mg/dL — ABNORMAL HIGH (ref 70–99)
Glucose-Capillary: 243 mg/dL — ABNORMAL HIGH (ref 70–99)
Glucose-Capillary: 214 mg/dL — ABNORMAL HIGH (ref 70–99)
Glucose-Capillary: 213 mg/dL — ABNORMAL HIGH (ref 70–99)

## 2020-06-07 MED ORDER — INSULIN DETEMIR 100 UNIT/ML ~~LOC~~ SOLN
15.0000 [IU] | Freq: Every day | SUBCUTANEOUS | Status: DC
  Administered 2020-06-07 – 2020-06-11 (×5): 15 [IU] via SUBCUTANEOUS
  Filled 2020-06-07 (×5): qty 0.15

## 2020-06-07 NOTE — Progress Notes (Signed)
Physical Therapy Treatment Patient Details Name: Lance [name] MRN: [id number] DOB: Dec 22, 1945 Today's Date: 06/07/2020    History of Present Illness Pt is a 74yo M admitted to [facility] on 05/14/20 because of concerns for AMS; friends found pt unresponsive on the ground and called EMS. Per EMS, CBG in 500's. Blood work concerning for leukocytosis and lactic acidosis. 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 PMH significant includes: T2DM, Hep C, schizophrenia, and sepsis.    PT Comments    Pt demonstrated increased tolerance for functional mobility today. Pt currently requires Supervision for bed mobility and transfers for safe technique and initiation of task.  Gait training initially with RW and SBA, no LOB noted, pt completed 173ft of gait training with single hand held assist with slight unsteadiness yet no LOB or required assistance to maintain.  Pt slightly fatigued with activity and returned to bedside chair with alarm on.  Pt states he lives alone, still drives - question accuracy.  Pt unable to state name of hospital or current year. Pt continues to remain a good candidate for SNF placement based on decreased strength, balance, fall risk, and concerns of pt living alone.    Follow Up Recommendations  SNF;Supervision/Assistance - 24 hour;Supervision for mobility/OOB     Equipment Recommendations  None recommended by PT    Recommendations for Other Services       Precautions / Restrictions Precautions Precautions: Fall Precaution Comments: High Fall Restrictions Weight Bearing Restrictions: No    Mobility  Bed Mobility Overal bed mobility: Needs Assistance Bed Mobility: Supine to Sit;Sit to Supine Rolling: Supervision   Supine to sit: Supervision Sit to supine: Supervision      Transfers Overall transfer level: Needs  assistance Equipment used: None Transfers: Sit to/from Stand Sit to Stand: Supervision         General transfer comment: verbal cues for hand placement  Ambulation/Gait Ambulation/Gait assistance: Min guard Gait Distance (Feet): 150 Feet Assistive device: 1 person hand held assist Gait Pattern/deviations: Drifts right/left         Stairs             Wheelchair Mobility    Modified Rankin (Stroke Patients Only)       Balance Overall balance assessment: Needs assistance Sitting-balance support: Feet supported;No upper extremity supported Sitting balance-Leahy Scale: Good Sitting balance - Comments: Steady static sitting, reaching within BOS.   Standing balance support: Single extremity supported Standing balance-Leahy Scale: Fair Standing balance comment:  (Decreased safety awareness)                            Cognition Arousal/Alertness: Awake/alert Behavior During Therapy: Impulsive Overall Cognitive Status: No family/caregiver present to determine baseline cognitive functioning Area of Impairment: Orientation;Attention;Following commands;Safety/judgement;Awareness;Problem solving                 Orientation Level: Disoriented to;Time;Situation;Place Current Attention Level: Alternating   Following Commands: Follows one step commands inconsistently Safety/Judgement: Decreased awareness of safety;Decreased awareness of deficits Awareness: Emergent Problem Solving: Slow processing;Decreased initiation;Difficulty sequencing;Requires verbal cues General Comments: Able to give name and states he is in the hospital, but unable to name hospital. Generally decreased awareness of safety.      Exercises Other Exercises Other Exercises: OT facilitates: bed mobility sup>sit, STS from EOB, functional mobility in room, toilet transfer, toileting, and  standing grooming with cueing for safety, sequencing, and safe use of AE/DME t/o session.     General Comments        Pertinent Vitals/Pain Pain Assessment: No/denies pain    Home Living                      Prior Function            PT Goals (current goals can now be found in the care plan section) Acute Rehab PT Goals Patient Stated Goal: To get stronger Progress towards PT goals: Progressing toward goals    Frequency    Min 2X/week      PT Plan Current plan remains appropriate    Co-evaluation              AM-PAC PT "6 Clicks" Mobility   Outcome Measure  Help needed turning from your back to your side while in a flat bed without using bedrails?: A Little Help needed moving from lying on your back to sitting on the side of a flat bed without using bedrails?: A Little Help needed moving to and from a bed to a chair (including a wheelchair)?: A Little Help needed standing up from a chair using your arms (e.g., wheelchair or bedside chair)?: A Little Help needed to walk in hospital room?: A Little Help needed climbing 3-5 steps with a railing? : A Little 6 Click Score: 18    End of Session Equipment Utilized During Treatment: Gait belt Activity Tolerance: Patient tolerated treatment well Patient left: with call bell/phone within reach;in chair;with chair alarm set;Other (comment) Nurse Communication: Mobility status PT Visit Diagnosis: Unsteadiness on feet (R26.81);Difficulty in walking, not elsewhere classified (R26.2)     Time: [id number] PT Time Calculation (min) (ACUTE ONLY): 30 min  Charges:  $Therapeutic Activity: 23-37 mins                     Mikel Cella, PTA   Josie Dixon 06/07/2020, 12:23 PM

## 2020-06-07 NOTE — Progress Notes (Signed)
Inpatient Diabetes Program Recommendations  AACE/ADA: New Consensus Statement on Inpatient Glycemic Control   Target Ranges:  Prepandial:   less than 140 mg/dL      Peak postprandial:   less than 180 mg/dL (1-2 hours)      Critically ill patients:  140 - 180 mg/dL   Results for Lance [name], Lance [name] (MRN [id number]) as of 06/07/2020 11:54  Ref. Range 06/06/2020 07:59 06/06/2020 11:52 06/06/2020 15:40 06/06/2020 21:30 06/07/2020 07:58  Glucose-Capillary Latest Ref Range: 70 - 99 mg/dL 812 (H) 751 (H) 700 (H) 318 (H) 150 (H)   Review of Glycemic Control  Current orders for Inpatient glycemic control: Levemir 15 units QHS, Novolog 0-15 units TID with meals, Novolog 0-5 units QHS  Inpatient Diabetes Program Recommendations:    Insulin: Fasting glucose 150 mg/dl today. Noted Levemir increased from 10 to 15 units QHS today. Post prandial glucose elevated and patient would benefit from having added meal coverage insulin.  Please consider decreasing Levemir to 10 units and ordering Novolog 3 units TID with meals for meal coverage if patient eats at least 50% of meals.  Thanks, Orlando Penner, RN, MSN, CDE Diabetes Coordinator Inpatient Diabetes Program [phone] (Team Pager from 8am to 5pm)

## 2020-06-07 NOTE — Evaluation (Signed)
Occupational Therapy Re-Evaluation Patient Details Name: Lance [name] MRN: [id number] DOB: 11-Apr-1946 Today's Date: 06/07/2020    History of Present Illness Pt is a 74yo M admitted to [facility] on 05/14/20 because of concerns for AMS; friends found pt unresponsive on the ground and called EMS. Per EMS, CBG in 500's. Blood work concerning for leukocytosis and lactic acidosis. 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 PMH significant includes: T2DM, Hep C, schizophrenia, and sepsis.   Clinical Impression   Lance [name] was seen for OT Re-Evaluation this date. Upon arrival to room pt supine in bed, appears to be sleeping, but wakes with VCs. Pt agreeable to OT session with some encouragement. Pt remains generally confused t/o session. He is oriented to self and limited place only. Unable to provide date or situational information despite prompting. Pt endorses desire to get out of bed, but requires consistent cueing for sequencing and increased time to initiate bed mobility. Once seated EOB, pt able to stand and perform functional mobility in room with close CGA for safety and cueing t/o. Pt stands at room window for ~1 min, when prompted to identify 5 items he sees outside, pt is unable to name any items, and states he needs to use the bathroom. Toilet transfer with close CGA using RW and BSC placed over room toilet. Pt performs toileting with supervision for safety, he does not alert therapist when ready to stand and stands without his device. Pt begins to rummage through dirty laundry bin and requires re-direction to standing grooming at sink for hand hygiene.  Consistent cueing for safety and sequencing required t/o all functional tasks this date. Pt is progressing toward OT goals and POC updated to reflect current pt needs. He continues to benefit from skilled  OT services to maximize return to PLOF and minimize risk of future falls, injury, caregiver burden, and readmission. Discharge recommendation remains appropriate.      Follow Up Recommendations  SNF;Supervision/Assistance - 24 hour    Equipment Recommendations   (Defer to next venue of care.)    Recommendations for Other Services       Precautions / Restrictions Precautions Precautions: Fall Precaution Comments: High Fall Restrictions Weight Bearing Restrictions: No      Mobility Bed Mobility Overal bed mobility: Needs Assistance Bed Mobility: Supine to Sit;Sit to Supine     Supine to sit: Supervision Sit to supine: Supervision        Transfers Overall transfer level: Needs assistance Equipment used: 1 person hand held assist Transfers: Sit to/from Stand Sit to Stand: Min guard         General transfer comment: CGA for safety t/o all functional mobility. Cueing for safe use of AE and sequencing during session.    Balance Overall balance assessment: Needs assistance Sitting-balance support: Feet supported;No upper extremity supported Sitting balance-Leahy Scale: Good Sitting balance - Comments: Steady static sitting, reaching within BOS.   Standing balance support: During functional activity;Bilateral upper extremity supported;Single extremity supported Standing balance-Leahy Scale: Fair Standing balance comment: Decreased safety awarness and awarness of deficits when completing standing tasks. Min posterior lean noted upon initial come to stand from EOB.                           ADL either performed or assessed with clinical judgement   ADL Overall ADL's :  Needs assistance/impaired     Grooming: Wash/dry hands;Standing;Min guard;Cueing for sequencing;Cueing for safety                   Toilet Transfer: Min guard;Cueing for safety;Cueing for sequencing;BSC;RW Toilet Transfer Details (indicate cue type and reason): Requires consistent  cueing for sequencing and safe use of AE during functional mobility. Toileting- Architect and Hygiene: Min guard;Cueing for safety;With adaptive equipment               Vision Baseline Vision/History: Wears glasses Patient Visual Report: No change from baseline       Perception     Praxis      Pertinent Vitals/Pain Pain Assessment: No/denies pain     Hand Dominance     Extremity/Trunk Assessment Upper Extremity Assessment Upper Extremity Assessment: Generalized weakness   Lower Extremity Assessment Lower Extremity Assessment: Generalized weakness   Cervical / Trunk Assessment Cervical / Trunk Assessment: Normal   Communication     Cognition Arousal/Alertness: Awake/alert Behavior During Therapy: Impulsive Overall Cognitive Status: Impaired/Different from baseline Area of Impairment: Orientation;Attention;Following commands;Safety/judgement;Awareness;Problem solving                 Orientation Level: Time;Situation;Place Current Attention Level: Alternating   Following Commands: Follows one step commands inconsistently Safety/Judgement: Decreased awareness of safety;Decreased awareness of deficits Awareness: Emergent Problem Solving: Slow processing;Decreased initiation;Difficulty sequencing;Requires verbal cues General Comments: Able to give name and states he is in the hospital, but unable to name hospital. Generally decreased awareness of safety.   General Comments       Exercises Other Exercises Other Exercises: OT facilitates: bed mobility sup>sit, STS from EOB, functional mobility in room, toilet transfer, toileting, and standing grooming with cueing for safety, sequencing, and safe use of AE/DME t/o session.   Shoulder Instructions      Home Living                                          Prior Functioning/Environment                   OT Problem List: Decreased strength;Decreased activity  tolerance;Decreased cognition;Impaired balance (sitting and/or standing);Decreased safety awareness;Decreased knowledge of use of DME or AE      OT Treatment/Interventions: Self-care/ADL training;Therapeutic exercise;Balance training;Energy conservation;Therapeutic activities;DME and/or AE instruction;Patient/family education    OT Goals(Current goals can be found in the care plan section) Acute Rehab OT Goals Patient Stated Goal: To get stronger OT Goal Formulation: With patient Time For Goal Achievement: 06/21/20 Potential to Achieve Goals: Good ADL Goals Pt Will Perform Grooming: with set-up;with supervision (c cueing/LRAD PRN for improved safety and functional indep.) Pt Will Transfer to Toilet: bedside commode;with supervision (c cueing/LRAD PRN for improved safety and functional indep.) Pt Will Perform Toileting - Clothing Manipulation and hygiene: with adaptive equipment;with set-up;with supervision (c cueing/LRAD PRN for improved safety and functional indep.) Pt Will Perform Tub/Shower Transfer: Shower transfer;with supervision;with set-up;shower seat (c cueing/LRAD PRN for improved safety and functional indep.)  OT Frequency: Min 1X/week   Barriers to D/C: Decreased caregiver support          Co-evaluation              AM-PAC OT "6 Clicks" Daily Activity     Outcome Measure Help from another person eating meals?: A Little Help from another person taking care of personal grooming?: A  Little Help from another person toileting, which includes using toliet, bedpan, or urinal?: A Little Help from another person bathing (including washing, rinsing, drying)?: A Little Help from another person to put on and taking off regular upper body clothing?: A Little Help from another person to put on and taking off regular lower body clothing?: A Little 6 Click Score: 18   End of Session Equipment Utilized During Treatment: Rolling walker;Gait belt Nurse Communication: Mobility status  (Pt had BM during session, cognition.)  Activity Tolerance: Patient tolerated treatment well Patient left: in chair;with call bell/phone within reach;with chair alarm set  OT Visit Diagnosis: Unsteadiness on feet (R26.81);Repeated falls (R29.6);Muscle weakness (generalized) (M62.81)                Time: 7517-0017 OT Time Calculation (min): 31 min Charges:  OT General Charges $OT Visit: 1 Visit OT Evaluation $OT Re-eval: 1 Re-eval OT Treatments $Self Care/Home Management : 8-22 mins  Shara Blazing, M.S., OTR/L Ascom: [phone] 06/07/20, 10:50 AM

## 2020-06-07 NOTE — Progress Notes (Signed)
Nutrition Follow-up  DOCUMENTATION CODES:   Non-severe (moderate) malnutrition in context of social or environmental circumstances  INTERVENTION:  Ensure Enlive po TID, each supplement provides 350 kcal and 20 grams of protein  MVI daily  Daily weights to assess recent trends  NUTRITION DIAGNOSIS:   Moderate Malnutrition related to social / environmental circumstances as evidenced by percent weight loss,moderate muscle depletion,mild fat depletion.   GOAL:   Patient will meet greater than or equal to 90% of their needs -progressing  MONITOR:   PO intake,Supplement acceptance,Labs,Weight trends,Skin,I & O's  REASON FOR ASSESSMENT:   LOS    ASSESSMENT:   74 y.o. 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  Patient continues to have good appetite and po intake, eating 75-100% (84% average x last 7 documented intakes 12/24-12/28). He is drinking Ensure supplements three times daily.   No new weights in the last 10 days, will order daily weights to assess trends. Weights have decreased ~10 lbs since admit and is ~4 lbs from usual weight.  I/Os: -9255.3 ml since admit UOP: 1000 ml x 24 hrs  Anticipated discharge after appeal process is completed by daughter of pt s/p insurance denied rehab on 12/28  Medications reviewed and include: Celexa, Ferrous sulfate, SSI, Miralax, Thiamine, MVI, B12  Labs: CBGs [id number]  Diet Order:   Diet Order            Diet Carb Modified Fluid consistency: Thin; Room service appropriate? Yes  Diet effective now                 EDUCATION NEEDS:   No education needs have been identified at this time  Skin:  Skin Assessment: Reviewed RN Assessment  Last BM:  12/23-type 3 (brown;smear)  Height:   Ht Readings  from Last 1 Encounters:  05/17/20 5\' 9"  (1.753 m)    Weight:   Wt Readings from Last 1 Encounters:  05/27/20 77.5 kg    Ideal Body Weight:  72.7 kg  BMI:  Body mass index is 25.24 kg/m.  Estimated Nutritional Needs:   Kcal:  1900-2200kcal/day  Protein:  95-110g/day  Fluid:  >1.9L/day   05/29/20, RD, LDN Clinical Nutrition After Hours/Weekend Pager # in Amion

## 2020-06-07 NOTE — Progress Notes (Signed)
PROGRESS NOTE    Lance [name]  SWF:[id number] DOB: [birth year] DOA: 05/14/2020 PCP: Preston Fleeting, MD   Chief Complaint  Patient presents with  . Altered Mental Status    last well unknown  Brief Narrative: 74 year old male with treatedhep C, diabetes schizophrenia, admitted to [facility] 10//21 due to altered mental status, he was found unresponsive on the ground and EMS was called, per EMS CBG was not 500.  Initial blood work consulted for leukocytosis, lactacidosis, imaging with right frontal meningioma with mild mass-effect in the right frontal lobe, trace SDG likely from trauma, chronic microhemorrhages concerning for amyloid angiopathy.  Patient was admitted for acute encephalopathy.  Subjective: aao to place, president. Has no new complaint.He is having his breakfast this morning. Blood sugar poorly controlled.Patient is afebrile.  Assessment & Plan:  Acute metabolic encephalopathy: Patient underwent extensive evaluation MRI brain showed innumerable chronic microhemorrhages suggestive of amyloid angiopathy, large meningioma and bilateral subdural hematomas and was seen by neurosurgery during the hospital stay.  Plan is to continue PT OT rehabilitation.  Type 2 diabetes mellitus, uncontrolled, HbA1c 10.8 poorly controlled. Pt eating > 50% meal, on levemir 10 u- increase to 15 units, cont ssi.  If remains poorly controlled we will consider adding premeal NovoLog Recent Labs  Lab 06/05/20 2108 06/06/20 0759 06/06/20 1152 06/06/20 1540 06/06/20 2130  GLUCAP 276* 170* 212* 157* 318*   Hepatitis C virus infection without hepatic coma, treated w/ harvoni  Functional Decline PT OT recommending rehab, patient unable to take care of himself with activities of daily living and living by himself.  TOC involved.  SNF was denied by insurance, appeal in the process  Substance abuse with cocaine, prior to coming into the hospital. BPH continue Flomax Depression/schizophrenia on  Celexa  Nutrition: Diet Order            Diet Carb Modified Fluid consistency: Thin; Room service appropriate? Yes  Diet effective now                 Nutrition Problem: Moderate Malnutrition Etiology: social / environmental circumstances Signs/Symptoms: percent weight loss,moderate muscle depletion,mild fat depletion Percent weight loss: 9 %    Body mass index is 25.24 kg/m. DVT prophylaxis: enoxaparin (LOVENOX) injection 40 mg Start: 05/19/20 2200 Place TED hose Start: 05/14/20 2242 Code Status:   Code Status: Full Code  Family Communication: plan of care discussed with patient at bedside.  Status is: Inpatient  Remains inpatient appropriate because:Inpatient level of care appropriate due to severity of illness as patient is unable to take care of himself at home alone and needs a skilled nursing facility  Dispo: The patient is from: Home              Anticipated d/c is to: SNF-daughter appealing the denial by insurance              Anticipated d/c date is: once bed available              Patient currently is medically stable to d/c.  Consultants:see note  Procedures:see note  Culture/Microbiology    Component Value Date/Time   SDES BLOOD RIGHT FA 05/14/2020 1823   SPECREQUEST  05/14/2020 1823    BOTTLES DRAWN AEROBIC AND ANAEROBIC Blood Culture adequate volume   CULT  05/14/2020 1823    NO GROWTH 5 DAYS Performed at [facility], [address] [place] [place], [place] [address]    REPTSTATUS 05/19/2020 FINAL 05/14/2020 1823    Other culture-see note  Medications: Scheduled Meds: . Chlorhexidine Gluconate Cloth  6 each Topical Daily  . citalopram  10 mg Oral Daily  . enoxaparin (LOVENOX) injection  40 mg Subcutaneous Q24H  . feeding supplement  237 mL Oral TID BM  . ferrous sulfate  325 mg Oral Q breakfast  . insulin aspart  0-15 Units Subcutaneous TID WC  . insulin aspart  0-5 Units Subcutaneous QHS  . insulin detemir  10 Units Subcutaneous Q2200  .  multivitamin with minerals  1 tablet Oral Daily  . mupirocin ointment   Nasal BID  . polyethylene glycol  17 g Oral Daily  . tamsulosin  0.8 mg Oral Daily  . thiamine  100 mg Oral Daily  . vitamin B-12  1,000 mcg Oral Daily   Continuous Infusions:  Antimicrobials: Anti-infectives (From admission, onward)   Start     Dose/Rate Route Frequency Ordered Stop   05/15/20 1230  Ledipasvir-Sofosbuvir 90-400 MG TABS 1 tablet  Status:  Discontinued        1 tablet Oral Daily 05/15/20 1223 05/30/20 1436   05/15/20 0900  vancomycin (VANCOREADY) IVPB 750 mg/150 mL  Status:  Discontinued        750 mg 150 mL/hr over 60 Minutes Intravenous Every 12 hours 05/14/20 2251 05/16/20 1809   05/15/20 0800  ceFEPIme (MAXIPIME) 2 g in sodium chloride 0.9 % 100 mL IVPB  Status:  Discontinued        2 g 200 mL/hr over 30 Minutes Intravenous Every 12 hours 05/14/20 2250 05/15/20 1321   05/14/20 2300  vancomycin (VANCOCIN) IVPB 1000 mg/200 mL premix        1,000 mg 200 mL/hr over 60 Minutes Intravenous  Once 05/14/20 2250 05/15/20 0156   05/14/20 2245  ceFEPIme (MAXIPIME) 2 g in sodium chloride 0.9 % 100 mL IVPB  Status:  Discontinued        2 g 200 mL/hr over 30 Minutes Intravenous  Once 05/14/20 2244 05/14/20 2248   05/14/20 2245  vancomycin (VANCOCIN) IVPB 1000 mg/200 mL premix  Status:  Discontinued        1,000 mg 200 mL/hr over 60 Minutes Intravenous  Once 05/14/20 2244 05/14/20 2248   05/14/20 2245  metroNIDAZOLE (FLAGYL) tablet 500 mg  Status:  Discontinued        500 mg Oral Every 8 hours 05/14/20 2244 05/14/20 2248   05/14/20 1930  ceFEPIme (MAXIPIME) 2 g in sodium chloride 0.9 % 100 mL IVPB        2 g 200 mL/hr over 30 Minutes Intravenous  Once 05/14/20 1923 05/14/20 2106   05/14/20 1930  metroNIDAZOLE (FLAGYL) IVPB 500 mg        500 mg 100 mL/hr over 60 Minutes Intravenous  Once 05/14/20 1923 05/14/20 2106   05/14/20 1930  vancomycin (VANCOCIN) IVPB 1000 mg/200 mL premix        1,000 mg 200  mL/hr over 60 Minutes Intravenous  Once 05/14/20 1923 05/14/20 2219     Objective: Vitals: Today's Vitals   06/06/20 1921 06/06/20 1945 06/07/20 0034 06/07/20 0511  BP: 121/75  121/68 140/86  Pulse: 76  72 67  Resp: 20  16 20   Temp: 97.9 F (36.6 C)  98.2 F (36.8 C) 98.6 F (37 C)  TempSrc:   Oral   SpO2: 100%  100% 100%  Weight:      Height:      PainSc:  0-No pain      Intake/Output Summary (Last 24 hours) at  06/07/2020 0706 Last data filed at 06/06/2020 2238 Gross per 24 hour  Intake 360 ml  Output 1000 ml  Net -640 ml   Filed Weights   05/24/20 0446 05/26/20 0342 05/27/20 0322  Weight: 74.1 kg 74.7 kg 77.5 kg   Weight change:   Intake/Output from previous day: 12/28 0701 - 12/29 0700 In: 360 [P.O.:360] Out: 1000 [Urine:1000] Intake/Output this shift: No intake/output data recorded.  Examination: General exam: AAO to place, people, current president could not tell date,NAD, weak appearing. HEENT:Oral mucosa moist, Ear/Nose WNL grossly,dentition normal. Respiratory system: bilaterally clear,no wheezing or crackles,no use of accessory muscle, non tender. Cardiovascular system: S1 & S2 +, regular, No JVD. Gastrointestinal system: Abdomen soft, NT,ND, BS+. Nervous System:Alert, awake, moving extremities and grossly nonfocal Extremities: No edema, distal peripheral pulses palpable.  Skin: No rashes,no icterus. MSK: Normal muscle bulk,tone, power  Data Reviewed: I have personally reviewed following labs and imaging studies CBC: Recent Labs  Lab 06/01/20 0441 06/04/20 0447  WBC 9.0 10.6*  HGB 11.5* 11.3*  HCT 34.6* 34.0*  MCV 91.5 91.4  PLT 265 009   Basic Metabolic Panel: Recent Labs  Lab 06/01/20 0441  NA 136  K 4.7  CL 102  CO2 29  GLUCOSE 133*  BUN 18  CREATININE 1.03  CALCIUM 8.8*   GFR: Estimated Creatinine Clearance: 64.8 mL/min (by C-G formula based on SCr of 1.03 mg/dL). Liver Function Tests: No results for input(s): AST, ALT,  ALKPHOS, BILITOT, PROT, ALBUMIN in the last 168 hours. No results for input(s): LIPASE, AMYLASE in the last 168 hours. No results for input(s): AMMONIA in the last 168 hours. Coagulation Profile: No results for input(s): INR, PROTIME in the last 168 hours. Cardiac Enzymes: No results for input(s): CKTOTAL, CKMB, CKMBINDEX, TROPONINI in the last 168 hours. BNP (last 3 results) No results for input(s): PROBNP in the last 8760 hours. HbA1C: No results for input(s): HGBA1C in the last 72 hours. CBG: Recent Labs  Lab 06/05/20 2108 06/06/20 0759 06/06/20 1152 06/06/20 1540 06/06/20 2130  GLUCAP 276* 170* 212* 157* 318*   Lipid Profile: No results for input(s): CHOL, HDL, LDLCALC, TRIG, CHOLHDL, LDLDIRECT in the last 72 hours. Thyroid Function Tests: No results for input(s): TSH, T4TOTAL, FREET4, T3FREE, THYROIDAB in the last 72 hours. Anemia Panel: No results for input(s): VITAMINB12, FOLATE, FERRITIN, TIBC, IRON, RETICCTPCT in the last 72 hours. Sepsis Labs: No results for input(s): PROCALCITON, LATICACIDVEN in the last 168 hours.  Recent Results (from the past 240 hour(s))  Resp Panel by RT-PCR (Flu A&B, Covid) Nasopharyngeal Swab     Status: None   Collection Time: 06/01/20 10:25 AM   Specimen: Nasopharyngeal Swab; Nasopharyngeal(NP) swabs in vial transport medium  Result Value Ref Range Status   SARS Coronavirus 2 by RT PCR NEGATIVE NEGATIVE Final    Comment: (NOTE) SARS-CoV-2 target nucleic acids are NOT DETECTED.  The SARS-CoV-2 RNA is generally detectable in upper respiratory specimens during the acute phase of infection. The lowest concentration of SARS-CoV-2 viral copies this assay can detect is 138 copies/mL. A negative result does not preclude SARS-Cov-2 infection and should not be used as the sole basis for treatment or other patient management decisions. A negative result may occur with  improper specimen collection/handling, submission of specimen other than  nasopharyngeal swab, presence of viral mutation(s) within the areas targeted by this assay, and inadequate number of viral copies(<138 copies/mL). A negative result must be combined with clinical observations, patient history, and epidemiological information. The expected  result is Negative.  Fact Sheet for Patients:  BloggerCourse.com  Fact Sheet for Healthcare Providers:  SeriousBroker.it  This test is no t yet approved or cleared by the [place] FDA and  has been authorized for detection and/or diagnosis of SARS-CoV-2 by FDA under an Emergency Use Authorization (EUA). This EUA will remain  in effect (meaning this test can be used) for the duration of the COVID-19 declaration under Section 564(b)(1) of the Act, 21 U.S.C.section 360bbb-3(b)(1), unless the authorization is terminated  or revoked sooner.       Influenza A by PCR NEGATIVE NEGATIVE Final   Influenza B by PCR NEGATIVE NEGATIVE Final    Comment: (NOTE) The Xpert Xpress SARS-CoV-2/FLU/RSV plus assay is intended as an aid in the diagnosis of influenza from Nasopharyngeal swab specimens and should not be used as a sole basis for treatment. Nasal washings and aspirates are unacceptable for Xpert Xpress SARS-CoV-2/FLU/RSV testing.  Fact Sheet for Patients: BloggerCourse.com  Fact Sheet for Healthcare Providers: SeriousBroker.it  This test is not yet approved or cleared by the [place] FDA and has been authorized for detection and/or diagnosis of SARS-CoV-2 by FDA under an Emergency Use Authorization (EUA). This EUA will remain in effect (meaning this test can be used) for the duration of the COVID-19 declaration under Section 564(b)(1) of the Act, 21 U.S.C. section 360bbb-3(b)(1), unless the authorization is terminated or revoked.  Performed at [facility], [address] [place], [place]  [address]   MRSA PCR Screening     Status: Abnormal   Collection Time: 06/04/20  6:37 AM   Specimen: Nasal Mucosa; Nasopharyngeal  Result Value Ref Range Status   MRSA by PCR POSITIVE (A) NEGATIVE Final    Comment:        The GeneXpert MRSA Assay (FDA approved for NASAL specimens only), is one component of a comprehensive MRSA colonization surveillance program. It is not intended to diagnose MRSA infection nor to guide or monitor treatment for MRSA infections. RESULT CALLED TO, READ BACK BY AND VERIFIED WITH:  Ander Slade [facility] AT 0800 06/05/20 SDR Performed at [facility], [address] [place], [place] [address]      Radiology Studies: No results found.   LOS: 23 days   Lanae Boast, MD [facility]  06/07/2020, 7:06 AM

## 2020-06-08 DIAGNOSIS — R410 Disorientation, unspecified: Secondary | ICD-10-CM | POA: Diagnosis not present

## 2020-06-08 LAB — GLUCOSE, CAPILLARY
Glucose-Capillary: 141 mg/dL — ABNORMAL HIGH (ref 70–99)
Glucose-Capillary: 245 mg/dL — ABNORMAL HIGH (ref 70–99)
Glucose-Capillary: 302 mg/dL — ABNORMAL HIGH (ref 70–99)
Glucose-Capillary: 192 mg/dL — ABNORMAL HIGH (ref 70–99)

## 2020-06-08 NOTE — Progress Notes (Signed)
PROGRESS NOTE    CHAOS POKE  [id number] DOB: 10-22-1945 DOA: 05/14/2020 PCP: Theotis Burrow, MD   Chief Complaint  Patient presents with  . Altered Mental Status    last well unknown  Brief Narrative: 74 year old male with treatedhep C, diabetes schizophrenia, admitted to [facility] 10//21 due to altered mental status, he was found unresponsive on the ground and EMS was called, per EMS CBG was not 500.  Initial blood work consulted for leukocytosis, lactacidosis, imaging with right frontal meningioma with mild mass-effect in the right frontal lobe, trace SDG likely from trauma, chronic microhemorrhages concerning for amyloid angiopathy.  Seen by neurosurgery no further recommendation.  Seen by PT OT and he has had back to skilled nursing facility, patient is unable to take care of himself at home.  Subjective: No acute events overnight.  Afebrile. He was eating his meal.  Alert awake oriented to self, place.  Assessment & Plan:  Acute metabolic encephalopathy: Patient underwent extensive evaluation MRI brain showed innumerable chronic microhemorrhages suggestive of amyloid angiopathy, large meningioma and bilateral subdural hematomas and was seen by neurosurgery during the hospital stay.  Mental status overall stable alert awake oriented, conversant.  Awaiting a skilled nursing facility.  Type 2 diabetes mellitus, uncontrolled, HbA1c 10.8 poorly controlled. Pt eating > 50% meal, blood sugar improving with increased Lantus at 15 units and sliding scale.  Monitor.  He may not be able to do premeal insulin will try simplifies insulin regimen given his mental status. Recent Labs  Lab 06/07/20 0758 06/07/20 1134 06/07/20 1213 06/07/20 1619 06/07/20 2020  GLUCAP 150* 243* 216* 214* 213*   Hepatitis C virus infection without hepatic coma, treated w/ harvoni.  Stable on 12/14.  Functional Decline PT OT recommending rehab, patient unable to take care of himself with activities of  daily living and living by himself.  Followed by [facility] closely.  SNF has been denied by incidence and appeal in the process.    Substance abuse with cocaine, prior to coming into the hospital.  Seen counseling strongly advised BPH voiding.  Continue Flomax.   Depression/schizophrenia on Celexa  Nutrition: Diet Order            Diet Carb Modified Fluid consistency: Thin; Room service appropriate? Yes  Diet effective now                 Nutrition Problem: Moderate Malnutrition Etiology: social / environmental circumstances Signs/Symptoms: percent weight loss,moderate muscle depletion,mild fat depletion Percent weight loss: 9 %    Body mass index is 25.24 kg/m. DVT prophylaxis: enoxaparin (LOVENOX) injection 40 mg Start: 05/19/20 2200 Place TED hose Start: 05/14/20 2242 Code Status:   Code Status: Full Code  Family Communication: plan of care discussed with patient at bedside.  Status is: Inpatient  Remains inpatient appropriate because:Inpatient level of care appropriate due to severity of illness as patient is unable to take care of himself at home alone and needs a skilled nursing facility  Dispo: The patient is from: Home              Anticipated d/c is to: SNF-daughter appealing the denial by insurance based upon previous note.  Contacted Education officer, museum, appeal has not been initiated, advised Education officer, museum to work on appeal              Anticipated d/c date [id number] bed available              Patient currently is medically stable to d/c.  Consultants:see note  Procedures:see note  Culture/Microbiology    Component Value Date/Time   SDES BLOOD RIGHT FA 05/14/2020 1823   SPECREQUEST  05/14/2020 1823    BOTTLES DRAWN AEROBIC AND ANAEROBIC Blood Culture adequate volume   CULT  05/14/2020 1823    NO GROWTH 5 DAYS Performed at [facility], [address] [place], [place] [id number]    REPTSTATUS 05/19/2020 FINAL 05/14/2020 1823    Other culture-see  note  Medications: Scheduled Meds: . Chlorhexidine Gluconate Cloth  6 each Topical Daily  . citalopram  10 mg Oral Daily  . enoxaparin (LOVENOX) injection  40 mg Subcutaneous Q24H  . feeding supplement  237 mL Oral TID BM  . ferrous sulfate  325 mg Oral Q breakfast  . insulin aspart  0-15 Units Subcutaneous TID WC  . insulin aspart  0-5 Units Subcutaneous QHS  . insulin detemir  15 Units Subcutaneous Q2200  . multivitamin with minerals  1 tablet Oral Daily  . mupirocin ointment   Nasal BID  . polyethylene glycol  17 g Oral Daily  . tamsulosin  0.8 mg Oral Daily  . thiamine  100 mg Oral Daily  . vitamin B-12  1,000 mcg Oral Daily   Continuous Infusions:  Antimicrobials: Anti-infectives (From admission, onward)   Start     Dose/Rate Route Frequency Ordered Stop   05/15/20 1230  Ledipasvir-Sofosbuvir 90-400 MG TABS 1 tablet  Status:  Discontinued        1 tablet Oral Daily 05/15/20 1223 05/30/20 1436   05/15/20 0900  vancomycin (VANCOREADY) IVPB 750 mg/150 mL  Status:  Discontinued        750 mg 150 mL/hr over 60 Minutes Intravenous Every 12 hours 05/14/20 2251 05/16/20 1809   05/15/20 0800  ceFEPIme (MAXIPIME) 2 g in sodium chloride 0.9 % 100 mL IVPB  Status:  Discontinued        2 g 200 mL/hr over 30 Minutes Intravenous Every 12 hours 05/14/20 2250 05/15/20 1321   05/14/20 2300  vancomycin (VANCOCIN) IVPB 1000 mg/200 mL premix        1,000 mg 200 mL/hr over 60 Minutes Intravenous  Once 05/14/20 2250 05/15/20 0156   05/14/20 2245  ceFEPIme (MAXIPIME) 2 g in sodium chloride 0.9 % 100 mL IVPB  Status:  Discontinued        2 g 200 mL/hr over 30 Minutes Intravenous  Once 05/14/20 2244 05/14/20 2248   05/14/20 2245  vancomycin (VANCOCIN) IVPB 1000 mg/200 mL premix  Status:  Discontinued        1,000 mg 200 mL/hr over 60 Minutes Intravenous  Once 05/14/20 2244 05/14/20 2248   05/14/20 2245  metroNIDAZOLE (FLAGYL) tablet 500 mg  Status:  Discontinued        500 mg Oral Every 8 hours  05/14/20 2244 05/14/20 2248   05/14/20 1930  ceFEPIme (MAXIPIME) 2 g in sodium chloride 0.9 % 100 mL IVPB        2 g 200 mL/hr over 30 Minutes Intravenous  Once 05/14/20 1923 05/14/20 2106   05/14/20 1930  metroNIDAZOLE (FLAGYL) IVPB 500 mg        500 mg 100 mL/hr over 60 Minutes Intravenous  Once 05/14/20 1923 05/14/20 2106   05/14/20 1930  vancomycin (VANCOCIN) IVPB 1000 mg/200 mL premix        1,000 mg 200 mL/hr over 60 Minutes Intravenous  Once 05/14/20 1923 05/14/20 2219     Objective: Vitals: Today's Vitals   06/07/20 1800  06/07/20 2019 06/08/20 0008 06/08/20 0439  BP: 118/75 126/70 129/81 111/60  Pulse:  81 71 68  Resp:  18 17 16   Temp:  98.1 F (36.7 C) 98.3 F (36.8 C) 98 F (36.7 C)  TempSrc:      SpO2:  100% 94% 100%  Weight:      Height:      PainSc:   0-No pain     Intake/Output Summary (Last 24 hours) at 06/08/2020 0709 Last data filed at 06/07/2020 0910 Gross per 24 hour  Intake --  Output 500 ml  Net -500 ml   Filed Weights   05/24/20 0446 05/26/20 0342 05/27/20 0322  Weight: 74.1 kg 74.7 kg 77.5 kg   Weight change:   Intake/Output from previous day: 12/29 0701 - 12/30 0700 In: -  Out: 500 [Urine:500] Intake/Output this shift: No intake/output data recorded.  Examination: General exam: AAO self, current place, NAD, weak appearing. HEENT:Oral mucosa moist, Ear/Nose WNL grossly, dentition normal. Respiratory system: bilaterally clear,no wheezing or crackles,no use of accessory muscle Cardiovascular system: S1 & S2 +, No JVD,. Gastrointestinal system: Abdomen soft, NT,ND, BS+ Nervous System:Alert, awake, moving extremities and grossly nonfocal Extremities: No edema, distal peripheral pulses palpable.  Skin: No rashes,no icterus. MSK: Normal muscle bulk,tone, power  Data Reviewed: I have personally reviewed following labs and imaging studies CBC: Recent Labs  Lab 06/04/20 0447 06/07/20 0721  WBC 10.6* 8.5  HGB 11.3* 11.3*  HCT 34.0*  33.3*  MCV 91.4 90.0  PLT 280 285   Basic Metabolic Panel: Recent Labs  Lab 06/07/20 0721  NA 135  K 4.1  CL 100  CO2 27  GLUCOSE 164*  BUN 17  CREATININE 0.80  CALCIUM 8.8*   GFR: Estimated Creatinine Clearance: 83.5 mL/min (by C-G formula based on SCr of 0.8 mg/dL). Liver Function Tests: No results for input(s): AST, ALT, ALKPHOS, BILITOT, PROT, ALBUMIN in the last 168 hours. No results for input(s): LIPASE, AMYLASE in the last 168 hours. No results for input(s): AMMONIA in the last 168 hours. Coagulation Profile: No results for input(s): INR, PROTIME in the last 168 hours. Cardiac Enzymes: No results for input(s): CKTOTAL, CKMB, CKMBINDEX, TROPONINI in the last 168 hours. BNP (last 3 results) No results for input(s): PROBNP in the last 8760 hours. HbA1C: No results for input(s): HGBA1C in the last 72 hours. CBG: Recent Labs  Lab 06/07/20 0758 06/07/20 1134 06/07/20 1213 06/07/20 1619 06/07/20 2020  GLUCAP 150* 243* 216* 214* 213*   Lipid Profile: No results for input(s): CHOL, HDL, LDLCALC, TRIG, CHOLHDL, LDLDIRECT in the last 72 hours. Thyroid Function Tests: No results for input(s): TSH, T4TOTAL, FREET4, T3FREE, THYROIDAB in the last 72 hours. Anemia Panel: No results for input(s): VITAMINB12, FOLATE, FERRITIN, TIBC, IRON, RETICCTPCT in the last 72 hours. Sepsis Labs: No results for input(s): PROCALCITON, LATICACIDVEN in the last 168 hours.  Recent Results (from the past 240 hour(s))  Resp Panel by RT-PCR (Flu A&B, Covid) Nasopharyngeal Swab     Status: None   Collection Time: 06/01/20 10:25 AM   Specimen: Nasopharyngeal Swab; Nasopharyngeal(NP) swabs in vial transport medium  Result Value Ref Range Status   SARS Coronavirus 2 by RT PCR NEGATIVE NEGATIVE Final    Comment: (NOTE) SARS-CoV-2 target nucleic acids are NOT DETECTED.  The SARS-CoV-2 RNA is generally detectable in upper respiratory specimens during the acute phase of infection. The  lowest concentration of SARS-CoV-2 viral copies this assay can detect is 138 copies/mL. A negative result does  not preclude SARS-Cov-2 infection and should not be used as the sole basis for treatment or other patient management decisions. A negative result may occur with  improper specimen collection/handling, submission of specimen other than nasopharyngeal swab, presence of viral mutation(s) within the areas targeted by this assay, and inadequate number of viral copies(<138 copies/mL). A negative result must be combined with clinical observations, patient history, and epidemiological information. The expected result is Negative.  Fact Sheet for Patients:  EntrepreneurPulse.com.au  Fact Sheet for Healthcare Providers:  IncredibleEmployment.be  This test is no t yet approved or cleared by the [place] FDA and  has been authorized for detection and/or diagnosis of SARS-CoV-2 by FDA under an Emergency Use Authorization (EUA). This EUA will remain  in effect (meaning this test can be used) for the duration of the COVID-19 declaration under Section 564(b)(1) of the Act, 21 U.S.C.section 360bbb-3(b)(1), unless the authorization is terminated  or revoked sooner.       Influenza A by PCR NEGATIVE NEGATIVE Final   Influenza B by PCR NEGATIVE NEGATIVE Final    Comment: (NOTE) The Xpert Xpress SARS-CoV-2/FLU/RSV plus assay is intended as an aid in the diagnosis of influenza from Nasopharyngeal swab specimens and should not be used as a sole basis for treatment. Nasal washings and aspirates are unacceptable for Xpert Xpress SARS-CoV-2/FLU/RSV testing.  Fact Sheet for Patients: EntrepreneurPulse.com.au  Fact Sheet for Healthcare Providers: IncredibleEmployment.be  This test is not yet approved or cleared by the [place] FDA and has been authorized for detection and/or diagnosis of SARS-CoV-2 by FDA under  an Emergency Use Authorization (EUA). This EUA will remain in effect (meaning this test can be used) for the duration of the COVID-19 declaration under Section 564(b)(1) of the Act, 21 U.S.C. section 360bbb-3(b)(1), unless the authorization is terminated or revoked.  Performed at [facility], [address] [place], [place] [address]   MRSA PCR Screening     Status: Abnormal   Collection Time: 06/04/20  6:37 AM   Specimen: Nasal Mucosa; Nasopharyngeal  Result Value Ref Range Status   MRSA by PCR POSITIVE (A) NEGATIVE Final    Comment:        The GeneXpert MRSA Assay (FDA approved for NASAL specimens only), is one component of a comprehensive MRSA colonization surveillance program. It is not intended to diagnose MRSA infection nor to guide or monitor treatment for MRSA infections. RESULT CALLED TO, READ BACK BY AND VERIFIED WITH:  Caryl Asp [facility] AT 0800 06/05/20 SDR Performed at [facility], [address] [place], [place] [address]      Radiology Studies: No results found.   LOS: 24 days   Antonieta Pert, MD [facility]  06/08/2020, 7:09 AM

## 2020-06-08 NOTE — TOC Progression Note (Addendum)
Transition of Care [facility]) - Progression Note    Patient Details  Name: Lance [name] MRN: [id number] Date of Birth: March 19, 1946  [facility] CM/SW Contact  Liliana Cline, LCSW Phone Number: 06/08/2020, 12:46 PM  Clinical Narrative:     CSW is picking this patient up today. Per chart patient is disoriented x 4. CSW called patient's daughter Alona Bene regarding appealing insurance denial for SNF. Alona Bene reported she thought another CSW started the appeal for her.   CSW called [employer] [facility] and spoke with Representative Timothy H. He reported there is no active appeal that he can see. CSW asked to start appeal process. He reported expedited appeals can take 72 hours. CSW provided information start appeal. Reference # is [id number]. Faxed clinicals to [phone]. Marcial Pacas reported appeal response will be sent via fax to [facility] fax.    Expected Discharge Plan: Skilled Nursing Facility Barriers to Discharge: Continued Medical Work up  Expected Discharge Plan and Services Expected Discharge Plan: Skilled Nursing Facility In-house Referral: Clinical Social Work   Post Acute Care Choice: Skilled Nursing Facility Living arrangements for the past 2 months: Single Family Home                                       Social Determinants of Health (SDOH) Interventions    Readmission Risk Interventions No flowsheet data found.

## 2020-06-08 NOTE — Care Management Important Message (Signed)
Important Message  Patient Details  Name: Lance [name] MRN: [id number] Date of Birth: 26-Jul-1945   Medicare Important Message Given:  Yes     [name] 06/08/2020, 11:15 AM

## 2020-06-09 DIAGNOSIS — R410 Disorientation, unspecified: Secondary | ICD-10-CM | POA: Diagnosis not present

## 2020-06-09 LAB — GLUCOSE, CAPILLARY
Glucose-Capillary: 146 mg/dL — ABNORMAL HIGH (ref 70–99)
Glucose-Capillary: 230 mg/dL — ABNORMAL HIGH (ref 70–99)
Glucose-Capillary: 208 mg/dL — ABNORMAL HIGH (ref 70–99)
Glucose-Capillary: 189 mg/dL — ABNORMAL HIGH (ref 70–99)

## 2020-06-09 NOTE — Progress Notes (Signed)
Inpatient Diabetes Program Recommendations  AACE/ADA: New Consensus Statement on Inpatient Glycemic Control (2015)  Target Ranges:  Prepandial:   less than 140 mg/dL      Peak postprandial:   less than 180 mg/dL (1-2 hours)      Critically ill patients:  140 - 180 mg/dL   Results for Lance, [name] (MRN [id number]) as of 06/09/2020 08:06  Ref. Range 06/08/2020 08:30 06/08/2020 12:29 06/08/2020 16:42 06/08/2020 19:35  Glucose-Capillary Latest Ref Range: 70 - 99 mg/dL 768 (H)  2 units NOVOLOG  245 (H)  5 units NOVOLOG  302 (H)  11 units NOVOLOG  192 (H)   15 units LANTUS @11pm    Results for Lance, [name] (MRN Wilburt Finlay) as of 06/09/2020 10:29  Ref. Range 06/09/2020 08:12  Glucose-Capillary Latest Ref Range: 70 - 99 mg/dL 06/11/2020 (H)   Home DM Meds: Levemir 10 units daily (pt Not taking)       Metformin 1000 mg bid (not taking)        Januvia 100 mg daily (not taking)   Current Orders: Levemir 15 units QHS       Novolog 0-15 units ac/hs    MD- Note AM CBGs have been OK.  Having issues with elevated afternoon CBGs.  Please consider starting Novolog Meal Coverage:  Novolog 4 units TID with meals  (Please add the following Hold Parameters: Hold if pt eats <50% of meal, Hold if pt NPO)    --Will follow patient during hospitalization--  [phone] RN, MSN, CDE Diabetes Coordinator Inpatient Glycemic Control Team Team Pager: [phone] (8a-5p)

## 2020-06-09 NOTE — Progress Notes (Signed)
PROGRESS NOTE    Lance [name]  [id number] DOB: 12/14/1945 DOA: 05/14/2020 PCP: Theotis Burrow, MD   Chief Complaint  Patient presents with  . Altered Mental Status    last well unknown  Brief Narrative: 74 year old male with treatedhep C, diabetes schizophrenia, admitted to [facility] 10//21 due to altered mental status, he was found unresponsive on the ground and EMS was called, per EMS CBG was not 500.  Initial blood work consulted for leukocytosis, lactacidosis, imaging with right frontal meningioma with mild mass-effect in the right frontal lobe, trace SDG likely from trauma, chronic microhemorrhages concerning for amyloid angiopathy.  Seen by neurosurgery no further recommendation.  Seen by PT OT and he has had back to skilled nursing facility, patient is unable to take care of himself at home. He is awaiting placement, SNF denied by insurance and currently in the process of appeal.    Subjective: Resting comfortably.  He is alert awake oriented to current president place, unable to tell me exact date. No acute events overnight. Assessment & Plan:  Acute metabolic encephalopathy: Patient underwent extensive evaluation MRI brain showed innumerable chronic microhemorrhages suggestive of amyloid angiopathy, large meningioma and bilateral subdural hematomas and was seen by neurosurgery during the hospital stay.  He is alert awake interactive oriented x2.  Pending snf  Type 2 diabetes mellitus, uncontrolled, HbA1c 10.8 poorly controlled. Pt eating > 50% meal, blood sugar improving with increased Lantus at 15 units and sliding scale.  Monitor.  He may not be able to do premeal insulin will try simplifies insulin regimen given his mental status.  He does eat well at times making his blood sugar go up. Recent Labs  Lab 06/07/20 2020 06/08/20 0830 06/08/20 1229 06/08/20 1642 06/08/20 1935  GLUCAP 213* 141* 245* 302* 192*   Hepatitis C virus infection without hepatic coma,  treated w/ harvoni.  Stable on 12/14.  Functional Decline PT OT recommending rehab, patient unable to take care of himself with activities of daily living and living by himself.  Followed by [facility] closely.  SNF has been denied by incidence and appeal in the process.    Substance abuse with cocaine, prior to coming into the hospital.  Seen counseling strongly advised BPH voiding.  Continue Flomax.   Depression/schizophrenia on Celexa  Nutrition: Diet Order            Diet Carb Modified Fluid consistency: Thin; Room service appropriate? Yes  Diet effective now                 Nutrition Problem: Moderate Malnutrition Etiology: social / environmental circumstances Signs/Symptoms: percent weight loss,moderate muscle depletion,mild fat depletion Percent weight loss: 9 %    Body mass index is 25.24 kg/m. DVT prophylaxis: enoxaparin (LOVENOX) injection 40 mg Start: 05/19/20 2200 Place TED hose Start: 05/14/20 2242 Code Status:   Code Status: Full Code  Family Communication: plan of care discussed with patient at bedside.  Status is: Inpatient  Remains inpatient appropriate because:Inpatient level of care appropriate due to severity of illness as patient is unable to take care of himself at home alone and needs a skilled nursing facility  Dispo: The patient is from: Home              Anticipated d/c is to: SNF-expedient appeal in process.  TOC involved.               Anticipated d/c date [id number] bed available  Patient currently is medically stable to d/c.  Consultants:see note  Procedures:see note  Culture/Microbiology    Component Value Date/Time   SDES BLOOD RIGHT FA 05/14/2020 1823   SPECREQUEST  05/14/2020 1823    BOTTLES DRAWN AEROBIC AND ANAEROBIC Blood Culture adequate volume   CULT  05/14/2020 1823    NO GROWTH 5 DAYS Performed at [facility], [address] [place], [place] [address]    REPTSTATUS 05/19/2020 FINAL 05/14/2020 1823    Other  culture-see note  Medications: Scheduled Meds: . Chlorhexidine Gluconate Cloth  6 each Topical Daily  . citalopram  10 mg Oral Daily  . enoxaparin (LOVENOX) injection  40 mg Subcutaneous Q24H  . feeding supplement  237 mL Oral TID BM  . ferrous sulfate  325 mg Oral Q breakfast  . insulin aspart  0-15 Units Subcutaneous TID WC  . insulin aspart  0-5 Units Subcutaneous QHS  . insulin detemir  15 Units Subcutaneous Q2200  . multivitamin with minerals  1 tablet Oral Daily  . mupirocin ointment   Nasal BID  . polyethylene glycol  17 g Oral Daily  . tamsulosin  0.8 mg Oral Daily  . thiamine  100 mg Oral Daily  . vitamin B-12  1,000 mcg Oral Daily   Continuous Infusions:  Antimicrobials: Anti-infectives (From admission, onward)   Start     Dose/Rate Route Frequency Ordered Stop   05/15/20 1230  Ledipasvir-Sofosbuvir 90-400 MG TABS 1 tablet  Status:  Discontinued        1 tablet Oral Daily 05/15/20 1223 05/30/20 1436   05/15/20 0900  vancomycin (VANCOREADY) IVPB 750 mg/150 mL  Status:  Discontinued        750 mg 150 mL/hr over 60 Minutes Intravenous Every 12 hours 05/14/20 2251 05/16/20 1809   05/15/20 0800  ceFEPIme (MAXIPIME) 2 g in sodium chloride 0.9 % 100 mL IVPB  Status:  Discontinued        2 g 200 mL/hr over 30 Minutes Intravenous Every 12 hours 05/14/20 2250 05/15/20 1321   05/14/20 2300  vancomycin (VANCOCIN) IVPB 1000 mg/200 mL premix        1,000 mg 200 mL/hr over 60 Minutes Intravenous  Once 05/14/20 2250 05/15/20 0156   05/14/20 2245  ceFEPIme (MAXIPIME) 2 g in sodium chloride 0.9 % 100 mL IVPB  Status:  Discontinued        2 g 200 mL/hr over 30 Minutes Intravenous  Once 05/14/20 2244 05/14/20 2248   05/14/20 2245  vancomycin (VANCOCIN) IVPB 1000 mg/200 mL premix  Status:  Discontinued        1,000 mg 200 mL/hr over 60 Minutes Intravenous  Once 05/14/20 2244 05/14/20 2248   05/14/20 2245  metroNIDAZOLE (FLAGYL) tablet 500 mg  Status:  Discontinued        500 mg Oral  Every 8 hours 05/14/20 2244 05/14/20 2248   05/14/20 1930  ceFEPIme (MAXIPIME) 2 g in sodium chloride 0.9 % 100 mL IVPB        2 g 200 mL/hr over 30 Minutes Intravenous  Once 05/14/20 1923 05/14/20 2106   05/14/20 1930  metroNIDAZOLE (FLAGYL) IVPB 500 mg        500 mg 100 mL/hr over 60 Minutes Intravenous  Once 05/14/20 1923 05/14/20 2106   05/14/20 1930  vancomycin (VANCOCIN) IVPB 1000 mg/200 mL premix        1,000 mg 200 mL/hr over 60 Minutes Intravenous  Once 05/14/20 1923 05/14/20 2219  Objective: Vitals: Today's Vitals   06/08/20 1644 06/08/20 1937 06/09/20 0008 06/09/20 0442  BP: 134/72 122/76 117/68 123/61  Pulse: 97 86 78 69  Resp: 16 17 17 17   Temp: 98.8 F (37.1 C) 98.1 F (36.7 C) 98.6 F (37 C) 97.9 F (36.6 C)  TempSrc:  Oral Oral   SpO2: 100% 100% 100% 100%  Weight:      Height:      PainSc:  0-No pain      Intake/Output Summary (Last 24 hours) at 06/09/2020 0759 Last data filed at 06/09/2020 0010 Gross per 24 hour  Intake 480 ml  Output 1950 ml  Net -1470 ml   Filed Weights   05/24/20 0446 05/26/20 0342 05/27/20 0322  Weight: 74.1 kg 74.7 kg 77.5 kg   Weight change:   Intake/Output from previous day: 12/30 0701 - 12/31 0700 In: 480 [P.O.:480] Out: 1950 [Urine:1950] Intake/Output this shift: No intake/output data recorded.  Examination: General exam: AAO TO PLACE, PEOPLE, interactive, NAD, weak appearing. HEENT:Oral mucosa moist, Ear/Nose WNL grossly, dentition normal. Respiratory system: bilaterally clear,no wheezing or crackles,no use of accessory muscle Cardiovascular system: S1 & S2 +, No JVD,. Gastrointestinal system: Abdomen soft, NT,ND, BS+ Nervous System:Alert, awake, moving extremities and grossly nonfocal Extremities: No edema, distal peripheral pulses palpable.  Skin: No rashes,no icterus. MSK: Normal muscle bulk,tone, power  Data Reviewed: I have personally reviewed following labs and imaging studies CBC: Recent Labs  Lab  06/04/20 0447 06/07/20 0721  WBC 10.6* 8.5  HGB 11.3* 11.3*  HCT 34.0* 33.3*  MCV 91.4 90.0  PLT 280 [id number]   Basic Metabolic Panel: Recent Labs  Lab 06/07/20 0721  NA 135  K 4.1  CL 100  CO2 27  GLUCOSE 164*  BUN 17  CREATININE 0.80  CALCIUM 8.8*   GFR: Estimated Creatinine Clearance: 83.5 mL/min (by C-G formula based on SCr of 0.8 mg/dL). Liver Function Tests: No results for input(s): AST, ALT, ALKPHOS, BILITOT, PROT, ALBUMIN in the last 168 hours. No results for input(s): LIPASE, AMYLASE in the last 168 hours. No results for input(s): AMMONIA in the last 168 hours. Coagulation Profile: No results for input(s): INR, PROTIME in the last 168 hours. Cardiac Enzymes: No results for input(s): CKTOTAL, CKMB, CKMBINDEX, TROPONINI in the last 168 hours. BNP (last 3 results) No results for input(s): PROBNP in the last 8760 hours. HbA1C: No results for input(s): HGBA1C in the last 72 hours. CBG: Recent Labs  Lab 06/07/20 2020 06/08/20 0830 06/08/20 1229 06/08/20 1642 06/08/20 1935  GLUCAP 213* 141* 245* 302* 192*   Lipid Profile: No results for input(s): CHOL, HDL, LDLCALC, TRIG, CHOLHDL, LDLDIRECT in the last 72 hours. Thyroid Function Tests: No results for input(s): TSH, T4TOTAL, FREET4, T3FREE, THYROIDAB in the last 72 hours. Anemia Panel: No results for input(s): VITAMINB12, FOLATE, FERRITIN, TIBC, IRON, RETICCTPCT in the last 72 hours. Sepsis Labs: No results for input(s): PROCALCITON, LATICACIDVEN in the last 168 hours.  Recent Results (from the past 240 hour(s))  Resp Panel by RT-PCR (Flu A&B, Covid) Nasopharyngeal Swab     Status: None   Collection Time: 06/01/20 10:25 AM   Specimen: Nasopharyngeal Swab; Nasopharyngeal(NP) swabs in vial transport medium  Result Value Ref Range Status   SARS Coronavirus 2 by RT PCR NEGATIVE NEGATIVE Final    Comment: (NOTE) SARS-CoV-2 target nucleic acids are NOT DETECTED.  The SARS-CoV-2 RNA is generally detectable in  upper respiratory specimens during the acute phase of infection. The lowest concentration of SARS-CoV-2  viral copies this assay can detect is 138 copies/mL. A negative result does not preclude SARS-Cov-2 infection and should not be used as the sole basis for treatment or other patient management decisions. A negative result may occur with  improper specimen collection/handling, submission of specimen other than nasopharyngeal swab, presence of viral mutation(s) within the areas targeted by this assay, and inadequate number of viral copies(<138 copies/mL). A negative result must be combined with clinical observations, patient history, and epidemiological information. The expected result is Negative.  Fact Sheet for Patients:  BloggerCourse.com  Fact Sheet for Healthcare Providers:  SeriousBroker.it  This test is no t yet approved or cleared by the [place] FDA and  has been authorized for detection and/or diagnosis of SARS-CoV-2 by FDA under an Emergency Use Authorization (EUA). This EUA will remain  in effect (meaning this test can be used) for the duration of the COVID-19 declaration under Section 564(b)(1) of the Act, 21 U.S.C.section 360bbb-3(b)(1), unless the authorization is terminated  or revoked sooner.       Influenza A by PCR NEGATIVE NEGATIVE Final   Influenza B by PCR NEGATIVE NEGATIVE Final    Comment: (NOTE) The Xpert Xpress SARS-CoV-2/FLU/RSV plus assay is intended as an aid in the diagnosis of influenza from Nasopharyngeal swab specimens and should not be used as a sole basis for treatment. Nasal washings and aspirates are unacceptable for Xpert Xpress SARS-CoV-2/FLU/RSV testing.  Fact Sheet for Patients: BloggerCourse.com  Fact Sheet for Healthcare Providers: SeriousBroker.it  This test is not yet approved or cleared by the [place] FDA and has been  authorized for detection and/or diagnosis of SARS-CoV-2 by FDA under an Emergency Use Authorization (EUA). This EUA will remain in effect (meaning this test can be used) for the duration of the COVID-19 declaration under Section 564(b)(1) of the Act, 21 U.S.C. section 360bbb-3(b)(1), unless the authorization is terminated or revoked.  Performed at [facility], [address] [place], [place] [address]   MRSA PCR Screening     Status: Abnormal   Collection Time: 06/04/20  6:37 AM   Specimen: Nasal Mucosa; Nasopharyngeal  Result Value Ref Range Status   MRSA by PCR POSITIVE (A) NEGATIVE Final    Comment:        The GeneXpert MRSA Assay (FDA approved for NASAL specimens only), is one component of a comprehensive MRSA colonization surveillance program. It is not intended to diagnose MRSA infection nor to guide or monitor treatment for MRSA infections. RESULT CALLED TO, READ BACK BY AND VERIFIED WITH:  Ander Slade [facility] AT 0800 06/05/20 SDR Performed at [facility], [address] [place], [place] [address]      Radiology Studies: No results found.   LOS: 25 days   Lanae Boast, MD [facility]  06/09/2020, 7:59 AM

## 2020-06-09 NOTE — Progress Notes (Signed)
Physical Therapy Treatment Patient Details Name: Lance [name] MRN: [id number] DOB: 30-Sep-1945 Today's Date: 06/09/2020    History of Present Illness Pt is a 74yo M admitted to [facility] on 05/14/20 because of concerns for AMS; friends found pt unresponsive on the ground and called EMS. Per EMS, CBG in 500's. Blood work concerning for leukocytosis and lactic acidosis. 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 PMH significant includes: T2DM, Hep C, schizophrenia, and sepsis.    PT Comments    Pt was supine upon arriving. Was just cleaned from having incontinent episode on floor and in bed. Pt is confused but pleasant and cooperative. Agrees to PT session and is eager for OOB activity. Cognition most limiting factor throughout session. He required no physical assistance to exit bed but is somewhat impulsive at times. He stood and ambulate ~200 ft without AD. Started gait training with HHA +1 progressing to no UE support. Does have balance deficits with dynamic activity. Recommend 24 hour supervision at DC for safety. Pt will benefit from continued skilled PT to address deficits with balance and overall safety awareness. Acute PT will continue to follow per POC.     Follow Up Recommendations  Supervision/Assistance - 24 hour;Supervision for mobility/OOB;SNF;Other (comment) (due to pt's cognition/ pt unsafe to live alone due to high fall risk)     Equipment Recommendations  None recommended by PT    Recommendations for Other Services       Precautions / Restrictions Precautions Precautions: Fall    Mobility  Bed Mobility Overal bed mobility: Needs Assistance Bed Mobility: Supine to Sit;Sit to Supine Rolling: Supervision   Supine to sit: Supervision Sit to supine: Supervision   General bed mobility comments: no physical assistance  required  Transfers Overall transfer level: Needs assistance Equipment used: None Transfers: Sit to/from Stand Sit to Stand: Supervision         General transfer comment: no physical assistance required to stand. Recommend use of RW when not with PT staff.  Ambulation/Gait Ambulation/Gait assistance: Min guard Gait Distance (Feet): 200 Feet Assistive device: None;1 person hand held assist Gait Pattern/deviations: Drifts right/left Gait velocity: slightly decraesed   General Gait Details: Pt was able to ambualte ~ 200 ft without AD. started with HHA +1 progressing to CGA without UE support. pt does have some balance deficits with narrow BOS/slight scissoring but overall tolerated wll.       Balance Overall balance assessment: Needs assistance Sitting-balance support: Feet supported;No upper extremity supported Sitting balance-Leahy Scale: Good Sitting balance - Comments: no LOB sitting EOB   Standing balance support: No upper extremity supported;During functional activity Standing balance-Leahy Scale: Poor Standing balance comment: Pt is high fall risk without UE support. recommend use of RW when up not with therapy       Cognition Arousal/Alertness: Awake/alert Behavior During Therapy: Impulsive (at times. per RN staff was OOB earlier without assistance.) Overall Cognitive Status: No family/caregiver present to determine baseline cognitive functioning      General Comments: Pt is alert and pleasantly confused throughout session. consistently able to follow commands and willing to perform all desired task.             Pertinent Vitals/Pain Pain Assessment: No/denies pain           PT Goals (current goals can now be found in the care plan section) Acute Rehab PT Goals  Patient Stated Goal: To get stronger Progress towards PT goals: Progressing toward goals    Frequency    Min 2X/week      PT Plan Current plan remains appropriate       AM-PAC PT "6  Clicks" Mobility   Outcome Measure  Help needed turning from your back to your side while in a flat bed without using bedrails?: A Little Help needed moving from lying on your back to sitting on the side of a flat bed without using bedrails?: A Little Help needed moving to and from a bed to a chair (including a wheelchair)?: A Little Help needed standing up from a chair using your arms (e.g., wheelchair or bedside chair)?: A Little Help needed to walk in hospital room?: A Little Help needed climbing 3-5 steps with a railing? : A Little 6 Click Score: 18    End of Session   Activity Tolerance: Patient tolerated treatment well Patient left: in bed;with call bell/phone within reach;with bed alarm set Nurse Communication: Mobility status PT Visit Diagnosis: Unsteadiness on feet (R26.81);Difficulty in walking, not elsewhere classified (R26.2)     Time: 1555-1610 PT Time Calculation (min) (ACUTE ONLY): 15 min  Charges:  $Gait Training: 8-22 mins                     Jetta Lout PTA 06/09/20, 4:25 PM

## 2020-06-10 DIAGNOSIS — R410 Disorientation, unspecified: Secondary | ICD-10-CM | POA: Diagnosis not present

## 2020-06-10 LAB — GLUCOSE, CAPILLARY
Glucose-Capillary: 176 mg/dL — ABNORMAL HIGH (ref 70–99)
Glucose-Capillary: 298 mg/dL — ABNORMAL HIGH (ref 70–99)
Glucose-Capillary: 120 mg/dL — ABNORMAL HIGH (ref 70–99)
Glucose-Capillary: 274 mg/dL — ABNORMAL HIGH (ref 70–99)

## 2020-06-10 MED ORDER — INSULIN ASPART 100 UNIT/ML ~~LOC~~ SOLN
4.0000 [IU] | Freq: Three times a day (TID) | SUBCUTANEOUS | Status: DC
  Administered 2020-06-10 – 2020-06-15 (×15): 4 [IU] via SUBCUTANEOUS
  Filled 2020-06-10 (×14): qty 1

## 2020-06-10 NOTE — Progress Notes (Signed)
PROGRESS NOTE    Lance [name]  [id number] DOB: 1945-06-19 DOA: 05/14/2020 PCP: Theotis Burrow, MD   Chief Complaint  Patient presents with  . Altered Mental Status    last well unknown  Brief Narrative: 75 year old male with treatedhep C, diabetes schizophrenia, admitted to [facility] 10//21 due to altered mental status, he was found unresponsive on the ground and EMS was called, per EMS CBG was not 500.  Initial blood work consulted for leukocytosis, lactacidosis, imaging with right frontal meningioma with mild mass-effect in the right frontal lobe, trace SDG likely from trauma, chronic microhemorrhages concerning for amyloid angiopathy.  Seen by neurosurgery no further recommendation.  Seen by PT OT and he has had back to skilled nursing facility, patient is unable to take care of himself at home. He is awaiting placement, SNF denied by insurance and currently in the process of appeal.    Subjective: He is alert awake resting comfortably.  No new complaints.  He had his breakfast this morning.  Assessment & Plan:  Acute metabolic encephalopathy: Patient underwent extensive evaluation MRI brain showed innumerable chronic microhemorrhages suggestive of amyloid angiopathy, large meningioma and bilateral subdural hematomas and was seen by neurosurgery during the hospital stay.  Currently alert awake interactive awaiting placement.   Type 2 diabetes mellitus, uncontrolled, HbA1c 10.8 poorly controlled.  Continue current Lantus 15 units and sliding scale.He may not be able to do premeal insulin so will try simplify insulin regimen given his mental status.  He does eat well at times making his blood sugar go up. Recent Labs  Lab 06/09/20 0812 06/09/20 1226 06/09/20 1601 06/09/20 1954 06/10/20 0724  GLUCAP 146* 230* 208* 189* 176*   Hepatitis C virus infection without hepatic coma, treated w/ harvoni.  Stable on 12/14.  Functional Decline PT OT recommending rehab, patient unable  to take care of himself with activities of daily living and living by himself.  Followed by [facility] closely.  SNF has been denied by incidence and appeal in the process.    Substance abuse with cocaine, prior to coming into the hospital.  Seen counseling strongly advised BPH voiding.  Continue Flomax.   Depression/schizophrenia on Celexa  Nutrition: Diet Order            Diet Carb Modified Fluid consistency: Thin; Room service appropriate? Yes  Diet effective now                 Nutrition Problem: Moderate Malnutrition Etiology: social / environmental circumstances Signs/Symptoms: percent weight loss,moderate muscle depletion,mild fat depletion Percent weight loss: 9 %    Body mass index is 25.24 kg/m. DVT prophylaxis: enoxaparin (LOVENOX) injection 40 mg Start: 05/19/20 2200 Place TED hose Start: 05/14/20 2242 Code Status:   Code Status: Full Code  Family Communication: plan of care discussed with patient at bedside.  Status is: Inpatient  Remains inpatient appropriate because:Inpatient level of care appropriate due to severity of illness as patient is unable to take care of himself at home alone and needs a skilled nursing facility  Dispo: The patient is from: Home              Anticipated d/c is to: SNF-family appeal in process.  TOC involved.               Anticipated d/c date [id number] bed available              Patient currently is medically stable to d/c.  Consultants:see note  Procedures:see note  Culture/Microbiology  Component Value Date/Time   SDES BLOOD RIGHT FA 05/14/2020 1823   SPECREQUEST  05/14/2020 1823    BOTTLES DRAWN AEROBIC AND ANAEROBIC Blood Culture adequate volume   CULT  05/14/2020 1823    NO GROWTH 5 DAYS Performed at [facility], [address] [place], [place] [address]    REPTSTATUS 05/19/2020 FINAL 05/14/2020 1823    Other culture-see note  Medications: Scheduled Meds: . Chlorhexidine Gluconate Cloth  6 each Topical Daily   . citalopram  10 mg Oral Daily  . enoxaparin (LOVENOX) injection  40 mg Subcutaneous Q24H  . feeding supplement  237 mL Oral TID BM  . ferrous sulfate  325 mg Oral Q breakfast  . insulin aspart  0-15 Units Subcutaneous TID WC  . insulin aspart  0-5 Units Subcutaneous QHS  . insulin aspart  4 Units Subcutaneous TID WC  . insulin detemir  15 Units Subcutaneous Q2200  . multivitamin with minerals  1 tablet Oral Daily  . polyethylene glycol  17 g Oral Daily  . tamsulosin  0.8 mg Oral Daily  . thiamine  100 mg Oral Daily  . vitamin B-12  1,000 mcg Oral Daily   Continuous Infusions:  Antimicrobials: Anti-infectives (From admission, onward)   Start     Dose/Rate Route Frequency Ordered Stop   05/15/20 1230  Ledipasvir-Sofosbuvir 90-400 MG TABS 1 tablet  Status:  Discontinued        1 tablet Oral Daily 05/15/20 1223 05/30/20 1436   05/15/20 0900  vancomycin (VANCOREADY) IVPB 750 mg/150 mL  Status:  Discontinued        750 mg 150 mL/hr over 60 Minutes Intravenous Every 12 hours 05/14/20 2251 05/16/20 1809   05/15/20 0800  ceFEPIme (MAXIPIME) 2 g in sodium chloride 0.9 % 100 mL IVPB  Status:  Discontinued        2 g 200 mL/hr over 30 Minutes Intravenous Every 12 hours 05/14/20 2250 05/15/20 1321   05/14/20 2300  vancomycin (VANCOCIN) IVPB 1000 mg/200 mL premix        1,000 mg 200 mL/hr over 60 Minutes Intravenous  Once 05/14/20 2250 05/15/20 0156   05/14/20 2245  ceFEPIme (MAXIPIME) 2 g in sodium chloride 0.9 % 100 mL IVPB  Status:  Discontinued        2 g 200 mL/hr over 30 Minutes Intravenous  Once 05/14/20 2244 05/14/20 2248   05/14/20 2245  vancomycin (VANCOCIN) IVPB 1000 mg/200 mL premix  Status:  Discontinued        1,000 mg 200 mL/hr over 60 Minutes Intravenous  Once 05/14/20 2244 05/14/20 2248   05/14/20 2245  metroNIDAZOLE (FLAGYL) tablet 500 mg  Status:  Discontinued        500 mg Oral Every 8 hours 05/14/20 2244 05/14/20 2248   05/14/20 1930  ceFEPIme (MAXIPIME) 2 g in sodium  chloride 0.9 % 100 mL IVPB        2 g 200 mL/hr over 30 Minutes Intravenous  Once 05/14/20 1923 05/14/20 2106   05/14/20 1930  metroNIDAZOLE (FLAGYL) IVPB 500 mg        500 mg 100 mL/hr over 60 Minutes Intravenous  Once 05/14/20 1923 05/14/20 2106   05/14/20 1930  vancomycin (VANCOCIN) IVPB 1000 mg/200 mL premix        1,000 mg 200 mL/hr over 60 Minutes Intravenous  Once 05/14/20 1923 05/14/20 2219     Objective: Vitals: Today's Vitals   06/10/20 0100 06/10/20 0356 06/10/20 0723 06/10/20 1000  BP:  127/70 117/66 136/75   Pulse: 75 73 80   Resp: 17 16 19    Temp: 98.3 F (36.8 C) 97.9 F (36.6 C) 98.6 F (37 C)   TempSrc:      SpO2: 100% 100% 100%   Weight:      Height:      PainSc:    0-No pain    Intake/Output Summary (Last 24 hours) at 06/10/2020 1139 Last data filed at 06/10/2020 0648 Gross per 24 hour  Intake 237 ml  Output 800 ml  Net -563 ml   Filed Weights   05/24/20 0446 05/26/20 0342 05/27/20 0322  Weight: 74.1 kg 74.7 kg 77.5 kg   Weight change:   Intake/Output from previous day: 12/31 0701 - 01/01 0700 In: 237 [P.O.:237] Out: 1200 [Urine:1200] Intake/Output this shift: No intake/output data recorded.  Examination: General exam: AAOx2 , NAD, weak appearing. HEENT:Oral mucosa moist, Ear/Nose WNL grossly, dentition normal. Respiratory system: bilaterally clear,no wheezing or crackles,no use of accessory muscle Cardiovascular system: S1 & S2 +, No JVD,. Gastrointestinal system: Abdomen soft, NT,ND, BS+ Nervous System:Alert, awake, moving extremities and grossly nonfocal Extremities: No edema, distal peripheral pulses palpable.  Skin: No rashes,no icterus. MSK: Normal muscle bulk,tone, power    Data Reviewed: I have personally reviewed following labs and imaging studies CBC: Recent Labs  Lab 06/04/20 0447 06/07/20 0721  WBC 10.6* 8.5  HGB 11.3* 11.3*  HCT 34.0* 33.3*  MCV 91.4 90.0  PLT 280 [id number]   Basic Metabolic Panel: Recent Labs  Lab  06/07/20 0721  NA 135  K 4.1  CL 100  CO2 27  GLUCOSE 164*  BUN 17  CREATININE 0.80  CALCIUM 8.8*   GFR: Estimated Creatinine Clearance: 83.5 mL/min (by C-G formula based on SCr of 0.8 mg/dL). Liver Function Tests: No results for input(s): AST, ALT, ALKPHOS, BILITOT, PROT, ALBUMIN in the last 168 hours. No results for input(s): LIPASE, AMYLASE in the last 168 hours. No results for input(s): AMMONIA in the last 168 hours. Coagulation Profile: No results for input(s): INR, PROTIME in the last 168 hours. Cardiac Enzymes: No results for input(s): CKTOTAL, CKMB, CKMBINDEX, TROPONINI in the last 168 hours. BNP (last 3 results) No results for input(s): PROBNP in the last 8760 hours. HbA1C: No results for input(s): HGBA1C in the last 72 hours. CBG: Recent Labs  Lab 06/09/20 0812 06/09/20 1226 06/09/20 1601 06/09/20 1954 06/10/20 0724  GLUCAP 146* 230* 208* 189* 176*   Lipid Profile: No results for input(s): CHOL, HDL, LDLCALC, TRIG, CHOLHDL, LDLDIRECT in the last 72 hours. Thyroid Function Tests: No results for input(s): TSH, T4TOTAL, FREET4, T3FREE, THYROIDAB in the last 72 hours. Anemia Panel: No results for input(s): VITAMINB12, FOLATE, FERRITIN, TIBC, IRON, RETICCTPCT in the last 72 hours. Sepsis Labs: No results for input(s): PROCALCITON, LATICACIDVEN in the last 168 hours.  Recent Results (from the past 240 hour(s))  Resp Panel by RT-PCR (Flu A&B, Covid) Nasopharyngeal Swab     Status: None   Collection Time: 06/01/20 10:25 AM   Specimen: Nasopharyngeal Swab; Nasopharyngeal(NP) swabs in vial transport medium  Result Value Ref Range Status   SARS Coronavirus 2 by RT PCR NEGATIVE NEGATIVE Final    Comment: (NOTE) SARS-CoV-2 target nucleic acids are NOT DETECTED.  The SARS-CoV-2 RNA is generally detectable in upper respiratory specimens during the acute phase of infection. The lowest concentration of SARS-CoV-2 viral copies this assay can detect is 138 copies/mL. A  negative result does not preclude SARS-Cov-2 infection and should not  be used as the sole basis for treatment or other patient management decisions. A negative result may occur with  improper specimen collection/handling, submission of specimen other than nasopharyngeal swab, presence of viral mutation(s) within the areas targeted by this assay, and inadequate number of viral copies(<138 copies/mL). A negative result must be combined with clinical observations, patient history, and epidemiological information. The expected result is Negative.  Fact Sheet for Patients:  BloggerCourse.com  Fact Sheet for Healthcare Providers:  SeriousBroker.it  This test is no t yet approved or cleared by the [place] FDA and  has been authorized for detection and/or diagnosis of SARS-CoV-2 by FDA under an Emergency Use Authorization (EUA). This EUA will remain  in effect (meaning this test can be used) for the duration of the COVID-19 declaration under Section 564(b)(1) of the Act, 21 U.S.C.section 360bbb-3(b)(1), unless the authorization is terminated  or revoked sooner.       Influenza A by PCR NEGATIVE NEGATIVE Final   Influenza B by PCR NEGATIVE NEGATIVE Final    Comment: (NOTE) The Xpert Xpress SARS-CoV-2/FLU/RSV plus assay is intended as an aid in the diagnosis of influenza from Nasopharyngeal swab specimens and should not be used as a sole basis for treatment. Nasal washings and aspirates are unacceptable for Xpert Xpress SARS-CoV-2/FLU/RSV testing.  Fact Sheet for Patients: BloggerCourse.com  Fact Sheet for Healthcare Providers: SeriousBroker.it  This test is not yet approved or cleared by the [place] FDA and has been authorized for detection and/or diagnosis of SARS-CoV-2 by FDA under an Emergency Use Authorization (EUA). This EUA will remain in effect (meaning this test can  be used) for the duration of the COVID-19 declaration under Section 564(b)(1) of the Act, 21 U.S.C. section 360bbb-3(b)(1), unless the authorization is terminated or revoked.  Performed at [facility], [address] [place], [place] [address]   MRSA PCR Screening     Status: Abnormal   Collection Time: 06/04/20  6:37 AM   Specimen: Nasal Mucosa; Nasopharyngeal  Result Value Ref Range Status   MRSA by PCR POSITIVE (A) NEGATIVE Final    Comment:        The GeneXpert MRSA Assay (FDA approved for NASAL specimens only), is one component of a comprehensive MRSA colonization surveillance program. It is not intended to diagnose MRSA infection nor to guide or monitor treatment for MRSA infections. RESULT CALLED TO, READ BACK BY AND VERIFIED WITH:  Ander Slade [facility] AT 0800 06/05/20 SDR Performed at [facility], [address] [place], [place] [address]      Radiology Studies: No results found.   LOS: 26 days   Lanae Boast, MD [facility]  06/10/2020, 11:39 AM

## 2020-06-11 DIAGNOSIS — R410 Disorientation, unspecified: Secondary | ICD-10-CM | POA: Diagnosis not present

## 2020-06-11 LAB — GLUCOSE, CAPILLARY
Glucose-Capillary: 230 mg/dL — ABNORMAL HIGH (ref 70–99)
Glucose-Capillary: 193 mg/dL — ABNORMAL HIGH (ref 70–99)
Glucose-Capillary: 253 mg/dL — ABNORMAL HIGH (ref 70–99)
Glucose-Capillary: 283 mg/dL — ABNORMAL HIGH (ref 70–99)

## 2020-06-11 NOTE — Progress Notes (Signed)
PROGRESS NOTE    Lance [name]  [id number] DOB: [birth year] DOA: 05/14/2020 PCP: Theotis Burrow, MD   Chief Complaint  Patient presents with  . Altered Mental Status    last well unknown  Brief Narrative: 75 year old male with treatedhep C, diabetes schizophrenia, admitted to [facility] 10//21 due to altered mental status, he was found unresponsive on the ground and EMS was called, per EMS CBG was not 500.  Initial blood work consulted for leukocytosis, lactacidosis, imaging with right frontal meningioma with mild mass-effect in the right frontal lobe, trace SDG likely from trauma, chronic microhemorrhages concerning for amyloid angiopathy.  Seen by neurosurgery no further recommendation.  Seen by PT OT and he has had back to skilled nursing facility, patient is unable to take care of himself at home. He is awaiting placement, SNF denied by insurance and currently in the process of appeal.    Subjective: Seen and examined patient is resting comfortably.  He has no new complaints. Assessment & Plan:  Acute metabolic encephalopathy: Patient underwent extensive evaluation MRI brain showed innumerable chronic microhemorrhages suggestive of amyloid angiopathy, large meningioma and bilateral subdural hematomas and was seen by neurosurgery during the hospital stay.  Currently alert awake interactive .he is waiting in facility.    Type 2 diabetes mellitus, uncontrolled, HbA1c 10.8 poorly controlled.  Continue current Lantus 15 units and sliding scale.He may not be able to do premeal insulin so will try simplify insulin regimen given his mental status.  He does eat well at times making his blood sugar go up. Recent Labs  Lab 06/09/20 1954 06/10/20 0724 06/10/20 1144 06/10/20 1531 06/10/20 2010  GLUCAP 189* 176* 298* 120* 274*   Hepatitis C virus infection without hepatic coma, treated w/ harvoni.  Stable on 12/14.  Functional Decline PT OT recommending rehab, patient unable to take  care of himself with activities of daily living and living by himself.  Followed by [facility] closely.  SNF has been denied by incidence and appeal in the process.  Patient will need a skilled nursing facility due to his deconditioning.    Substance abuse with cocaine, prior to coming into the hospital.  Seen counseling strongly advised BPH voiding.  Continue Flomax.   Depression/schizophrenia on Celexa  Nutrition: Diet Order            Diet Carb Modified Fluid consistency: Thin; Room service appropriate? Yes  Diet effective now                 Nutrition Problem: Moderate Malnutrition Etiology: social / environmental circumstances Signs/Symptoms: percent weight loss,moderate muscle depletion,mild fat depletion Percent weight loss: 9 %    Body mass index is 23.83 kg/m. DVT prophylaxis: enoxaparin (LOVENOX) injection 40 mg Start: 05/19/20 2200 Place TED hose Start: 05/14/20 2242 Code Status:   Code Status: Full Code  Family Communication: plan of care discussed with patient at bedside.  Status is: Inpatient  Remains inpatient appropriate because:Inpatient level of care appropriate due to severity of illness as patient is unable to take care of himself at home alone and needs a skilled nursing facility  Dispo: The patient is from: Home              Anticipated d/c is to: SNF-family appeal in process.  TOC involved.               Anticipated d/c date [id number] bed available              Patient currently is medically  stable to d/c.  Consultants:see note  Procedures:see note  Culture/Microbiology    Component Value Date/Time   SDES BLOOD RIGHT FA 05/14/2020 1823   SPECREQUEST  05/14/2020 1823    BOTTLES DRAWN AEROBIC AND ANAEROBIC Blood Culture adequate volume   CULT  05/14/2020 1823    NO GROWTH 5 DAYS Performed at [facility], [address] [place], [place] [address]    REPTSTATUS 05/19/2020 FINAL 05/14/2020 1823    Other culture-see  note  Medications: Scheduled Meds: . Chlorhexidine Gluconate Cloth  6 each Topical Daily  . citalopram  10 mg Oral Daily  . enoxaparin (LOVENOX) injection  40 mg Subcutaneous Q24H  . feeding supplement  237 mL Oral TID BM  . ferrous sulfate  325 mg Oral Q breakfast  . insulin aspart  0-15 Units Subcutaneous TID WC  . insulin aspart  0-5 Units Subcutaneous QHS  . insulin aspart  4 Units Subcutaneous TID WC  . insulin detemir  15 Units Subcutaneous Q2200  . multivitamin with minerals  1 tablet Oral Daily  . polyethylene glycol  17 g Oral Daily  . tamsulosin  0.8 mg Oral Daily  . thiamine  100 mg Oral Daily  . vitamin B-12  1,000 mcg Oral Daily   Continuous Infusions:  Antimicrobials: Anti-infectives (From admission, onward)   Start     Dose/Rate Route Frequency Ordered Stop   05/15/20 1230  Ledipasvir-Sofosbuvir 90-400 MG TABS 1 tablet  Status:  Discontinued        1 tablet Oral Daily 05/15/20 1223 05/30/20 1436   05/15/20 0900  vancomycin (VANCOREADY) IVPB 750 mg/150 mL  Status:  Discontinued        750 mg 150 mL/hr over 60 Minutes Intravenous Every 12 hours 05/14/20 2251 05/16/20 1809   05/15/20 0800  ceFEPIme (MAXIPIME) 2 g in sodium chloride 0.9 % 100 mL IVPB  Status:  Discontinued        2 g 200 mL/hr over 30 Minutes Intravenous Every 12 hours 05/14/20 2250 05/15/20 1321   05/14/20 2300  vancomycin (VANCOCIN) IVPB 1000 mg/200 mL premix        1,000 mg 200 mL/hr over 60 Minutes Intravenous  Once 05/14/20 2250 05/15/20 0156   05/14/20 2245  ceFEPIme (MAXIPIME) 2 g in sodium chloride 0.9 % 100 mL IVPB  Status:  Discontinued        2 g 200 mL/hr over 30 Minutes Intravenous  Once 05/14/20 2244 05/14/20 2248   05/14/20 2245  vancomycin (VANCOCIN) IVPB 1000 mg/200 mL premix  Status:  Discontinued        1,000 mg 200 mL/hr over 60 Minutes Intravenous  Once 05/14/20 2244 05/14/20 2248   05/14/20 2245  metroNIDAZOLE (FLAGYL) tablet 500 mg  Status:  Discontinued        500 mg Oral  Every 8 hours 05/14/20 2244 05/14/20 2248   05/14/20 1930  ceFEPIme (MAXIPIME) 2 g in sodium chloride 0.9 % 100 mL IVPB        2 g 200 mL/hr over 30 Minutes Intravenous  Once 05/14/20 1923 05/14/20 2106   05/14/20 1930  metroNIDAZOLE (FLAGYL) IVPB 500 mg        500 mg 100 mL/hr over 60 Minutes Intravenous  Once 05/14/20 1923 05/14/20 2106   05/14/20 1930  vancomycin (VANCOCIN) IVPB 1000 mg/200 mL premix        1,000 mg 200 mL/hr over 60 Minutes Intravenous  Once 05/14/20 1923 05/14/20 2219     Objective: Vitals:  Today's Vitals   06/11/20 0026 06/11/20 0400 06/11/20 0500 06/11/20 0512  BP: 138/80   137/82  Pulse: 76   75  Resp: 17   17  Temp: 98 F (36.7 C)   98 F (36.7 C)  TempSrc:      SpO2: 99%   98%  Weight:   73.2 kg   Height:      PainSc: 0-No pain 0-No pain      Intake/Output Summary (Last 24 hours) at 06/11/2020 0808 Last data filed at 06/11/2020 0533 Gross per 24 hour  Intake 240 ml  Output 1900 ml  Net -1660 ml   Filed Weights   05/26/20 0342 05/27/20 0322 06/11/20 0500  Weight: 74.7 kg 77.5 kg 73.2 kg   Weight change:   Intake/Output from previous day: 01/01 0701 - 01/02 0700 In: 240 [P.O.:240] Out: 1900 [Urine:1900] Intake/Output this shift: No intake/output data recorded.  Examination: General exam: AAOx2 , NAD, weak appearing. HEENT:Oral mucosa moist, Ear/Nose WNL grossly, dentition normal. Respiratory system: bilaterally clear,no wheezing or crackles,no use of accessory muscle Cardiovascular system: S1 & S2 +, No JVD,. Gastrointestinal system: Abdomen soft, NT,ND, BS+ Nervous System:Alert, awake, moving extremities and grossly nonfocal Extremities: No edema, distal peripheral pulses palpable.  Skin: No rashes,no icterus. MSK: Normal muscle bulk,tone, power    Data Reviewed: I have personally reviewed following labs and imaging studies CBC: Recent Labs  Lab 06/07/20 0721  WBC 8.5  HGB 11.3*  HCT 33.3*  MCV 90.0  PLT [id number]   Basic  Metabolic Panel: Recent Labs  Lab 06/07/20 0721  NA 135  K 4.1  CL 100  CO2 27  GLUCOSE 164*  BUN 17  CREATININE 0.80  CALCIUM 8.8*   GFR: Estimated Creatinine Clearance: 83.5 mL/min (by C-G formula based on SCr of 0.8 mg/dL). Liver Function Tests: No results for input(s): AST, ALT, ALKPHOS, BILITOT, PROT, ALBUMIN in the last 168 hours. No results for input(s): LIPASE, AMYLASE in the last 168 hours. No results for input(s): AMMONIA in the last 168 hours. Coagulation Profile: No results for input(s): INR, PROTIME in the last 168 hours. Cardiac Enzymes: No results for input(s): CKTOTAL, CKMB, CKMBINDEX, TROPONINI in the last 168 hours. BNP (last 3 results) No results for input(s): PROBNP in the last 8760 hours. HbA1C: No results for input(s): HGBA1C in the last 72 hours. CBG: Recent Labs  Lab 06/09/20 1954 06/10/20 0724 06/10/20 1144 06/10/20 1531 06/10/20 2010  GLUCAP 189* 176* 298* 120* 274*   Lipid Profile: No results for input(s): CHOL, HDL, LDLCALC, TRIG, CHOLHDL, LDLDIRECT in the last 72 hours. Thyroid Function Tests: No results for input(s): TSH, T4TOTAL, FREET4, T3FREE, THYROIDAB in the last 72 hours. Anemia Panel: No results for input(s): VITAMINB12, FOLATE, FERRITIN, TIBC, IRON, RETICCTPCT in the last 72 hours. Sepsis Labs: No results for input(s): PROCALCITON, LATICACIDVEN in the last 168 hours.  Recent Results (from the past 240 hour(s))  Resp Panel by RT-PCR (Flu A&B, Covid) Nasopharyngeal Swab     Status: None   Collection Time: 06/01/20 10:25 AM   Specimen: Nasopharyngeal Swab; Nasopharyngeal(NP) swabs in vial transport medium  Result Value Ref Range Status   SARS Coronavirus 2 by RT PCR NEGATIVE NEGATIVE Final    Comment: (NOTE) SARS-CoV-2 target nucleic acids are NOT DETECTED.  The SARS-CoV-2 RNA is generally detectable in upper respiratory specimens during the acute phase of infection. The lowest concentration of SARS-CoV-2 viral copies this  assay can detect is 138 copies/mL. A negative result does not  preclude SARS-Cov-2 infection and should not be used as the sole basis for treatment or other patient management decisions. A negative result may occur with  improper specimen collection/handling, submission of specimen other than nasopharyngeal swab, presence of viral mutation(s) within the areas targeted by this assay, and inadequate number of viral copies(<138 copies/mL). A negative result must be combined with clinical observations, patient history, and epidemiological information. The expected result is Negative.  Fact Sheet for Patients:  BloggerCourse.com  Fact Sheet for Healthcare Providers:  SeriousBroker.it  This test is no t yet approved or cleared by the [place] FDA and  has been authorized for detection and/or diagnosis of SARS-CoV-2 by FDA under an Emergency Use Authorization (EUA). This EUA will remain  in effect (meaning this test can be used) for the duration of the COVID-19 declaration under Section 564(b)(1) of the Act, 21 U.S.C.section 360bbb-3(b)(1), unless the authorization is terminated  or revoked sooner.       Influenza A by PCR NEGATIVE NEGATIVE Final   Influenza B by PCR NEGATIVE NEGATIVE Final    Comment: (NOTE) The Xpert Xpress SARS-CoV-2/FLU/RSV plus assay is intended as an aid in the diagnosis of influenza from Nasopharyngeal swab specimens and should not be used as a sole basis for treatment. Nasal washings and aspirates are unacceptable for Xpert Xpress SARS-CoV-2/FLU/RSV testing.  Fact Sheet for Patients: BloggerCourse.com  Fact Sheet for Healthcare Providers: SeriousBroker.it  This test is not yet approved or cleared by the [place] FDA and has been authorized for detection and/or diagnosis of SARS-CoV-2 by FDA under an Emergency Use Authorization (EUA). This EUA will  remain in effect (meaning this test can be used) for the duration of the COVID-19 declaration under Section 564(b)(1) of the Act, 21 U.S.C. section 360bbb-3(b)(1), unless the authorization is terminated or revoked.  Performed at [facility], [address] [place], [place] [address]   MRSA PCR Screening     Status: Abnormal   Collection Time: 06/04/20  6:37 AM   Specimen: Nasal Mucosa; Nasopharyngeal  Result Value Ref Range Status   MRSA by PCR POSITIVE (A) NEGATIVE Final    Comment:        The GeneXpert MRSA Assay (FDA approved for NASAL specimens only), is one component of a comprehensive MRSA colonization surveillance program. It is not intended to diagnose MRSA infection nor to guide or monitor treatment for MRSA infections. RESULT CALLED TO, READ BACK BY AND VERIFIED WITH:  Ander Slade [facility] AT 0800 06/05/20 SDR Performed at [facility], [address] [place], [place] [address]      Radiology Studies: No results found.   LOS: 27 days   Lanae Boast, MD [facility]  06/11/2020, 8:08 AM

## 2020-06-12 DIAGNOSIS — R410 Disorientation, unspecified: Secondary | ICD-10-CM | POA: Diagnosis not present

## 2020-06-12 LAB — GLUCOSE, CAPILLARY
Glucose-Capillary: 184 mg/dL — ABNORMAL HIGH (ref 70–99)
Glucose-Capillary: 230 mg/dL — ABNORMAL HIGH (ref 70–99)
Glucose-Capillary: 204 mg/dL — ABNORMAL HIGH (ref 70–99)

## 2020-06-12 MED ORDER — INSULIN DETEMIR 100 UNIT/ML ~~LOC~~ SOLN
18.0000 [IU] | Freq: Every day | SUBCUTANEOUS | Status: DC
  Administered 2020-06-12 – 2020-06-14 (×3): 18 [IU] via SUBCUTANEOUS
  Filled 2020-06-12 (×4): qty 0.18

## 2020-06-12 NOTE — Progress Notes (Signed)
Occupational Therapy Treatment Patient Details Name: Lance [name] MRN: [id number] DOB: 1946-03-13 Today's Date: 06/12/2020    History of present illness Pt is a 75yo M admitted to [facility] on 05/14/20 because of concerns for AMS; friends found pt unresponsive on the ground and called EMS. Per EMS, CBG in 500's. Blood work concerning for leukocytosis and lactic acidosis. 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 PMH significant includes: T2DM, Hep C, schizophrenia, and sepsis.   OT comments  Pt seen for OT tx this date. OT faciltiates pt particiaption in standing grooming tasks with SBA/SUPV with RW initially and progress to no AD use. Pt complete oral hygiene, washing face and top of head and applying lotion. Pt primarily needs MIN/MOD visual/verbal cues for sequence/attn to task. Pt tolerates session well. Left in chair with all needs met and in reach, chair alarm activated. RN updated on pt performance. While pt has progress with his physical tolerance of functional activity, he still is impulsive with decreased safety awareness and is a high fall risk. Continue to anticipate pt could benefit from STR at [facility] and ultimately may require some more permanent source of SUPV for safety at a later date.    Follow Up Recommendations  SNF;Supervision/Assistance - 24 hour    Equipment Recommendations  Other (comment) (defer to next level of care)    Recommendations for Other Services      Precautions / Restrictions Precautions Precautions: Fall Restrictions Weight Bearing Restrictions: No       Mobility Bed Mobility               General bed mobility comments: pt up to chair pre/post  Transfers Overall transfer level: Needs assistance Equipment used: Rolling walker (2 wheeled);None Transfers: Sit to/from Stand Sit to Stand: Supervision          General transfer comment: Pt able to complete ADL transfers with RW with SUPV only. Pt demos ability to perform w/o RW as well, slightly unsteady, but no gross LOB. CGA for fxl mobility initially, but pt able to complete transfers with SUPV/clse SBA and no physical assistance. Improved control    Balance Overall balance assessment: Needs assistance Sitting-balance support: Feet supported;No upper extremity supported Sitting balance-Leahy Scale: Good     Standing balance support: Bilateral upper extremity supported;No upper extremity supported;During functional activity Standing balance-Leahy Scale: Fair Standing balance comment: Pt with G standing balance with UE support of RW, but completes fxl mobility and standign tasks reasonably well, alternating hands from sink for ADLs and with no AD for fxl mobility back to chair with F standing balance.                           ADL either performed or assessed with clinical judgement   ADL Overall ADL's : Needs assistance/impaired     Grooming: Wash/dry hands;Wash/dry face;Oral care;Supervision/safety;Standing;Cueing for sequencing;Cueing for safety Grooming Details (indicate cue type and reason): with RW standing sink-side CGA initially. Able to progress to static stand with SUPV/SBA only with no AD, but uses sink for UE support intermittently. Pt also reaches to gather ADL items with F dynamic standing balance with CGA. Cues for sequence and attn.         Upper Body Dressing : Set up;Sitting Upper Body Dressing Details (indicate cue type and reason): to don gown to back  side to cover posterior in sitting-SETUP to oriented garment as it is unfamiliar                 Functional mobility during ADLs: Supervision/safety;Rolling walker (CGA initially with RW, then progressed to [place]. Some slight unsteadiness, but no gross LOB. Pt performs fxl mobility back to chair w/o AD with close SBA.)       Vision Baseline  Vision/History: Wears glasses Patient Visual Report: No change from baseline     Perception     Praxis      Cognition Arousal/Alertness: Awake/alert Behavior During Therapy: WFL for tasks assessed/performed;Impulsive Overall Cognitive Status: No family/caregiver present to determine baseline cognitive functioning Area of Impairment: Orientation;Attention;Following commands;Safety/judgement                 Orientation Level: Disoriented to;Time;Situation Current Attention Level: Sustained   Following Commands: Follows multi-step commands consistently;Follows one step commands with increased time Safety/Judgement: Decreased awareness of safety;Decreased awareness of deficits     General Comments: Pt is able to follow simple one step commands, some impulsivity, but overall compliant with safety cues, but do not anticipate pt could make good safety judgements without cueing as he is still slightly unsteady.        Exercises Other Exercises Other Exercises: OT faciltiates pt particiaption in standing grooming tasks with SBA/SUPV with RW initially and progress to no AD use. Pt complete oral hygiene, washing face and top of head and applying lotion. Pt primarily needs MIN/MOD visual/verbal cues for sequence/attn to task.   Shoulder Instructions       General Comments      Pertinent Vitals/ Pain       Pain Assessment: Faces Faces Pain Scale: Hurts little more Pain Location: abomen/back Pain Descriptors / Indicators: Dull;Discomfort Pain Intervention(s): Limited activity within patient's tolerance;Repositioned  Home Living                                          Prior Functioning/Environment              Frequency  Min 1X/week        Progress Toward Goals  OT Goals(current goals can now be found in the care plan section)  Progress towards OT goals: Progressing toward goals  Acute Rehab OT Goals Patient Stated Goal: To get stronger OT  Goal Formulation: With patient Time For Goal Achievement: 06/21/20 Potential to Achieve Goals: Good  Plan Discharge plan remains appropriate;Frequency remains appropriate    Co-evaluation                 AM-PAC OT "6 Clicks" Daily Activity     Outcome Measure   Help from another person eating meals?: A Little Help from another person taking care of personal grooming?: A Little Help from another person toileting, which includes using toliet, bedpan, or urinal?: A Little Help from another person bathing (including washing, rinsing, drying)?: A Little Help from another person to put on and taking off regular upper body clothing?: A Little Help from another person to put on and taking off regular lower body clothing?: A Little 6 Click Score: 18    End of Session Equipment Utilized During Treatment: Rolling walker;Gait belt  OT Visit Diagnosis: Unsteadiness on feet (R26.81);Repeated falls (R29.6);Muscle weakness (generalized) (M62.81)   Activity Tolerance Patient tolerated treatment well   Patient Left in chair;with call bell/phone within reach;with  chair alarm set   Nurse Communication Mobility status        Time: 7218-2883 OT Time Calculation (min): 29 min  Charges: OT General Charges $OT Visit: 1 Visit OT Treatments $Self Care/Home Management : 8-22 mins $Therapeutic Activity: 8-22 mins  Gerrianne Scale, MS, OTR/L ascom [phone] 06/12/20, 4:00 PM

## 2020-06-12 NOTE — Progress Notes (Signed)
PROGRESS NOTE    Lance [name]  [id number] DOB: [birth year] DOA: 05/14/2020 PCP: Theotis Burrow, MD   Chief Complaint  Patient presents with  . Altered Mental Status    last well unknown  Brief Narrative: 75 year old male with treatedhep C, diabetes schizophrenia, admitted to [facility] 10//21 due to altered mental status, he was found unresponsive on the ground and EMS was called, per EMS CBG was not 500.  Initial blood work consulted for leukocytosis, lactacidosis, imaging with right frontal meningioma with mild mass-effect in the right frontal lobe, trace SDG likely from trauma, chronic microhemorrhages concerning for amyloid angiopathy.  Seen by neurosurgery no further recommendation.  Seen by PT OT and he has had back to skilled nursing facility, patient is unable to take care of himself at home. He is awaiting placement, SNF denied by insurance and currently in the process of appeal.    Subjective: No fever overnight.  Blood pressure is stable. Blood sugar fluctuating IN 190-280S Patient is resting.  He has no complaints, does not feel very good today  Assessment & Plan:  Acute metabolic encephalopathy: Patient underwent extensive evaluation MRI brain showed innumerable chronic microhemorrhages suggestive of amyloid angiopathy, large meningioma and bilateral subdural hematomas and was seen by neurosurgery during the hospital stay no acute intervention recommended.  Patient is communicative alert awake, continue to work with PT OT awaiting for placement.  Type 2 diabetes mellitus,uncontrolled, HbA1c 10.8 poorly controlled.Blood sugar poorly controlled we will increase Lantus  15 UNITS to 18 units, cont 4 units Premeal insulin, keep on sliding scale. He does eat well at times making his blood sugar go up. Recent Labs  Lab 06/10/20 2010 06/11/20 0828 06/11/20 1154 06/11/20 1612 06/11/20 2011  GLUCAP 274* 230* 193* 253* 283*   Hepatitis C virus infection without hepatic  coma, treated w/ harvoni.  Stable on 12/14.  Functional Decline PT OT recommending rehab, patient unable to take care of himself with activities of daily living and living by himself.Followed by [facility] closely.SNF has been denied by incidence and appeal in the process.Patient will need a skilled nursing facility due to his deconditioning.    Substance abuse with cocaine, prior to coming into the hospital.  Seen counseling strongly advised BPH voiding.  Continue Flomax.   Depression/schizophrenia on Celexa  Nutrition: Diet Order            Diet Carb Modified Fluid consistency: Thin; Room service appropriate? Yes  Diet effective now                 Nutrition Problem: Moderate Malnutrition Etiology: social / environmental circumstances Signs/Symptoms: percent weight loss,moderate muscle depletion,mild fat depletion Percent weight loss: 9 %    Body mass index is 23.83 kg/m. DVT prophylaxis: enoxaparin (LOVENOX) injection 40 mg Start: 05/19/20 2200 Place TED hose Start: 05/14/20 2242 Code Status:   Code Status: Full Code  Family Communication: plan of care discussed with patient at bedside.  Status is: Inpatient  Remains inpatient appropriate because:Inpatient level of care appropriate due to severity of illness as patient is unable to take care of himself at home alone and needs a skilled nursing facility  Dispo: The patient is from: Home              Anticipated d/c is to: SNF-discussed with social worker, appeal in the process.               Anticipated d/c date [id number] bed available  Patient currently is medically stable to d/c.  Consultants:see note  Procedures:see note  Culture/Microbiology    Component Value Date/Time   SDES BLOOD RIGHT FA 05/14/2020 1823   SPECREQUEST  05/14/2020 1823    BOTTLES DRAWN AEROBIC AND ANAEROBIC Blood Culture adequate volume   CULT  05/14/2020 1823    NO GROWTH 5 DAYS Performed at [facility], [address]  [place], [place] [address]    REPTSTATUS 05/19/2020 FINAL 05/14/2020 1823    Other culture-see note  Medications: Scheduled Meds: . Chlorhexidine Gluconate Cloth  6 each Topical Daily  . citalopram  10 mg Oral Daily  . enoxaparin (LOVENOX) injection  40 mg Subcutaneous Q24H  . feeding supplement  237 mL Oral TID BM  . ferrous sulfate  325 mg Oral Q breakfast  . insulin aspart  0-15 Units Subcutaneous TID WC  . insulin aspart  0-5 Units Subcutaneous QHS  . insulin aspart  4 Units Subcutaneous TID WC  . insulin detemir  15 Units Subcutaneous Q2200  . multivitamin with minerals  1 tablet Oral Daily  . polyethylene glycol  17 g Oral Daily  . tamsulosin  0.8 mg Oral Daily  . thiamine  100 mg Oral Daily  . vitamin B-12  1,000 mcg Oral Daily   Continuous Infusions:  Antimicrobials: Anti-infectives (From admission, onward)   Start     Dose/Rate Route Frequency Ordered Stop   05/15/20 1230  Ledipasvir-Sofosbuvir 90-400 MG TABS 1 tablet  Status:  Discontinued        1 tablet Oral Daily 05/15/20 1223 05/30/20 1436   05/15/20 0900  vancomycin (VANCOREADY) IVPB 750 mg/150 mL  Status:  Discontinued        750 mg 150 mL/hr over 60 Minutes Intravenous Every 12 hours 05/14/20 2251 05/16/20 1809   05/15/20 0800  ceFEPIme (MAXIPIME) 2 g in sodium chloride 0.9 % 100 mL IVPB  Status:  Discontinued        2 g 200 mL/hr over 30 Minutes Intravenous Every 12 hours 05/14/20 2250 05/15/20 1321   05/14/20 2300  vancomycin (VANCOCIN) IVPB 1000 mg/200 mL premix        1,000 mg 200 mL/hr over 60 Minutes Intravenous  Once 05/14/20 2250 05/15/20 0156   05/14/20 2245  ceFEPIme (MAXIPIME) 2 g in sodium chloride 0.9 % 100 mL IVPB  Status:  Discontinued        2 g 200 mL/hr over 30 Minutes Intravenous  Once 05/14/20 2244 05/14/20 2248   05/14/20 2245  vancomycin (VANCOCIN) IVPB 1000 mg/200 mL premix  Status:  Discontinued        1,000 mg 200 mL/hr over 60 Minutes Intravenous  Once 05/14/20 2244 05/14/20 2248    05/14/20 2245  metroNIDAZOLE (FLAGYL) tablet 500 mg  Status:  Discontinued        500 mg Oral Every 8 hours 05/14/20 2244 05/14/20 2248   05/14/20 1930  ceFEPIme (MAXIPIME) 2 g in sodium chloride 0.9 % 100 mL IVPB        2 g 200 mL/hr over 30 Minutes Intravenous  Once 05/14/20 1923 05/14/20 2106   05/14/20 1930  metroNIDAZOLE (FLAGYL) IVPB 500 mg        500 mg 100 mL/hr over 60 Minutes Intravenous  Once 05/14/20 1923 05/14/20 2106   05/14/20 1930  vancomycin (VANCOCIN) IVPB 1000 mg/200 mL premix        1,000 mg 200 mL/hr over 60 Minutes Intravenous  Once 05/14/20 1923 05/14/20 2219  Objective: Vitals: Today's Vitals   06/11/20 1615 06/11/20 1956 06/11/20 2000 06/12/20 0515  BP: 130/78 138/69  139/85  Pulse: 70 81  80  Resp: 16 16  18   Temp: 98.6 F (37 C) 98.1 F (36.7 C)  98.4 F (36.9 C)  TempSrc: Oral Oral  Oral  SpO2: 100% 98%  99%  Weight:      Height:      PainSc:   0-No pain    No intake or output data in the 24 hours ending 06/12/20 0714 Filed Weights   05/26/20 0342 05/27/20 0322 06/11/20 0500  Weight: 74.7 kg 77.5 kg 73.2 kg   Weight change:   Intake/Output from previous day: No intake/output data recorded. Intake/Output this shift: No intake/output data recorded.  Examination: General exam: AAO, weak, frail,NAD,weak appearing. HEENT:Oral mucosa moist, Ear/Nose WNL grossly,dentition normal. Respiratory system: bilaterally clear,no wheezing or crackles,no use of accessory muscle. Cardiovascular system: S1 & S2 +, No JVD. Gastrointestinal system: Abdomen soft, NT,ND,BS+. Nervous System:Alert, awake, moving extremities and grossly nonfocal. Extremities: No edema, distal peripheral pulses palpable.  Skin: No rashes,no icterus. MSK: Normal muscle bulk,tone, power.  Data Reviewed: I have personally reviewed following labs and imaging studies CBC: Recent Labs  Lab 06/07/20 0721  WBC 8.5  HGB 11.3*  HCT 33.3*  MCV 90.0  PLT 285   Basic Metabolic  Panel: Recent Labs  Lab 06/07/20 0721  NA 135  K 4.1  CL 100  CO2 27  GLUCOSE 164*  BUN 17  CREATININE 0.80  CALCIUM 8.8*   GFR: Estimated Creatinine Clearance: 83.5 mL/min (by C-G formula based on SCr of 0.8 mg/dL). Liver Function Tests: No results for input(s): AST, ALT, ALKPHOS, BILITOT, PROT, ALBUMIN in the last 168 hours. No results for input(s): LIPASE, AMYLASE in the last 168 hours. No results for input(s): AMMONIA in the last 168 hours. Coagulation Profile: No results for input(s): INR, PROTIME in the last 168 hours. Cardiac Enzymes: No results for input(s): CKTOTAL, CKMB, CKMBINDEX, TROPONINI in the last 168 hours. BNP (last 3 results) No results for input(s): PROBNP in the last 8760 hours. HbA1C: No results for input(s): HGBA1C in the last 72 hours. CBG: Recent Labs  Lab 06/10/20 2010 06/11/20 0828 06/11/20 1154 06/11/20 1612 06/11/20 2011  GLUCAP 274* 230* 193* 253* 283*   Lipid Profile: No results for input(s): CHOL, HDL, LDLCALC, TRIG, CHOLHDL, LDLDIRECT in the last 72 hours. Thyroid Function Tests: No results for input(s): TSH, T4TOTAL, FREET4, T3FREE, THYROIDAB in the last 72 hours. Anemia Panel: No results for input(s): VITAMINB12, FOLATE, FERRITIN, TIBC, IRON, RETICCTPCT in the last 72 hours. Sepsis Labs: No results for input(s): PROCALCITON, LATICACIDVEN in the last 168 hours.  Recent Results (from the past 240 hour(s))  MRSA PCR Screening     Status: Abnormal   Collection Time: 06/04/20  6:37 AM   Specimen: Nasal Mucosa; Nasopharyngeal  Result Value Ref Range Status   MRSA by PCR POSITIVE (A) NEGATIVE Final    Comment:        The GeneXpert MRSA Assay (FDA approved for NASAL specimens only), is one component of a comprehensive MRSA colonization surveillance program. It is not intended to diagnose MRSA infection nor to guide or monitor treatment for MRSA infections. RESULT CALLED TO, READ BACK BY AND VERIFIED WITH:  06/06/20 [facility] AT 0800  06/05/20 SDR Performed at [facility], [address] [place], [place]      Radiology Studies: No results found.   LOS: 28  days   Antonieta Pert, MD [facility]  06/12/2020, 7:14 AM

## 2020-06-12 NOTE — Care Management Important Message (Signed)
Important Message  Patient Details  Name: Lance [name] MRN: [id number] Date of Birth: 21-Mar-1946   Medicare Important Message Given:  Yes     Olegario Messier A Boluwatife Mutchler 06/12/2020, 11:15 AM

## 2020-06-12 NOTE — TOC Progression Note (Addendum)
Transition of Care [facility]) - Progression Note    Patient Details  Name: Lance [name] MRN: [id number] Date of Birth: 1946-01-15  Transition of Care [facility]) CM/SW Contact  Liliana Cline, LCSW Phone Number: 06/12/2020, 10:02 AM  Clinical Narrative:   CSW called [employer] to follow up on appeal status. Spoke to [employer], on hold x 1 hour 5 minutes. Bene then provided number for [employer] Erroll Luna for CSW to call to get more information on appeal status. CSW called Cristal Deer at [phone], left a voicemail requesting a return call as soon as possible.  1:38- Attempted another call to [facility] with [employer]. Left another voicemail requesting a return call.   Discussed case with [facility] Supervisor then called Teena at [facility] to ask if they would consider patient for long term care if Monia Pouch denies appeal for short term rehab. She reported his Medicaid would have to be confirmed by their Business Office. She reported she will ask her Business Office now and let CSW know.    Expected Discharge Plan: Skilled Nursing Facility Barriers to Discharge: Continued Medical Work up  Expected Discharge Plan and Services Expected Discharge Plan: Skilled Nursing Facility In-house Referral: Clinical Social Work   Post Acute Care Choice: Skilled Nursing Facility Living arrangements for the past 2 months: Single Family Home                                       Social Determinants of Health (SDOH) Interventions    Readmission Risk Interventions No flowsheet data found.

## 2020-06-13 DIAGNOSIS — R410 Disorientation, unspecified: Secondary | ICD-10-CM | POA: Diagnosis not present

## 2020-06-13 LAB — GLUCOSE, CAPILLARY
Glucose-Capillary: 189 mg/dL — ABNORMAL HIGH (ref 70–99)
Glucose-Capillary: 219 mg/dL — ABNORMAL HIGH (ref 70–99)
Glucose-Capillary: 188 mg/dL — ABNORMAL HIGH (ref 70–99)
Glucose-Capillary: 123 mg/dL — ABNORMAL HIGH (ref 70–99)

## 2020-06-13 NOTE — Progress Notes (Addendum)
PROGRESS NOTE    Lance [name]  [id number] DOB: 02-Feb-1946 DOA: 05/14/2020 PCP: Theotis Burrow, MD   Chief Complaint  Patient presents with  . Altered Mental Status    last well unknown  Brief Narrative: 75 year old male with treatedhep C, diabetes schizophrenia, admitted to [facility] 10//21 due to altered mental status, he was found unresponsive on the ground and EMS was called, per EMS CBG was not 500.  Initial blood work consulted for leukocytosis, lactacidosis, imaging with right frontal meningioma with mild mass-effect in the right frontal lobe, trace SDG likely from trauma, chronic microhemorrhages concerning for amyloid angiopathy.  Seen by neurosurgery no further recommendation.  Seen by PT OT and he has had back to skilled nursing facility, patient is unable to take care of himself at home. He is awaiting placement, SNF denied by insurance and currently in the process of appeal.    Subjective: Seen this morning standing at the bedside trying to get dressed.  No new complaints.    Assessment & Plan:  Acute metabolic encephalopathy: Patient underwent extensive evaluation MRI brain showed innumerable chronic microhemorrhages suggestive of amyloid angiopathy, large meningioma and bilateral subdural hematomas and was seen by neurosurgery during the hospital stay no acute intervention recommended.  Is alert awake communicative.  Unable to live by himself alone at home and is awaiting for skilled nursing facility.   Type 2 diabetes mellitus,uncontrolled, HbA1c 10.8 poorly controlled.Blood sugar poorly controlled continue Lantus 18 units, cont 4 units Premeal insulin, keep on sliding scale. He does eat well at times making his blood sugar go up. Recent Labs  Lab 06/11/20 2011 06/12/20 0744 06/12/20 1134 06/12/20 1610 06/13/20 0819  GLUCAP 283* 184* 230* 204* 189*   Hepatitis C virus infection without hepatic coma, treated w/ harvoni.  Stable on 12/14.  Functional Decline  PT/OT recommending rehab-will continue PT OT while he is waiting on placement., patient unable to take care of himself with activities of daily living and living by himself.Followed by [facility] closely.SNF has been denied by incidence and appeal in the process.Patient will need a skilled nursing facility due to his deconditioning.    Substance abuse with cocaine, prior to coming into the hospital.  Seen counseling strongly advised BPH voiding.  Continue Flomax.   Depression/schizophrenia continue on Celexa mood is stable  Nutrition: Diet Order            Diet Carb Modified Fluid consistency: Thin; Room service appropriate? Yes  Diet effective now                 Nutrition Problem: Moderate Malnutrition Etiology: social / environmental circumstances Signs/Symptoms: percent weight loss,moderate muscle depletion,mild fat depletion Percent weight loss: 9 %    Body mass index is 23.78 kg/m. DVT prophylaxis: enoxaparin (LOVENOX) injection 40 mg Start: 05/19/20 2200 Place TED hose Start: 05/14/20 2242 Code Status:   Code Status: Full Code  Family Communication: plan of care discussed with patient at bedside. I called her daughter  [name] plan updated all questions answered and she claims to be  RN, is aware that if patient is unable to get a skilled nursing facility they will arrange for him to return home and herself and her sister wil be there to help and they  can do insulin inj.  Status is: Inpatient  Remains inpatient appropriate because:Inpatient level of care appropriate due to severity of illness as patient is unable to take care of himself at home alone and needs a skilled nursing facility  Dispo: The patient is from: Home              Anticipated d/c is to: SNF-discussed with social worker, appeal in the process.               Anticipated d/c date [id number] bed available              Patient currently is medically stable to d/c.  Consultants:see note  Procedures:see  note  Culture/Microbiology    Component Value Date/Time   SDES BLOOD RIGHT FA 05/14/2020 1823   SPECREQUEST  05/14/2020 1823    BOTTLES DRAWN AEROBIC AND ANAEROBIC Blood Culture adequate volume   CULT  05/14/2020 1823    NO GROWTH 5 DAYS Performed at [facility], [address] [place] [place], [place] [address]    REPTSTATUS 05/19/2020 FINAL 05/14/2020 1823    Other culture-see note  Medications: Scheduled Meds: . Chlorhexidine Gluconate Cloth  6 each Topical Daily  . citalopram  10 mg Oral Daily  . enoxaparin (LOVENOX) injection  40 mg Subcutaneous Q24H  . feeding supplement  237 mL Oral TID BM  . ferrous sulfate  325 mg Oral Q breakfast  . insulin aspart  0-15 Units Subcutaneous TID WC  . insulin aspart  0-5 Units Subcutaneous QHS  . insulin aspart  4 Units Subcutaneous TID WC  . insulin detemir  18 Units Subcutaneous Q2200  . multivitamin with minerals  1 tablet Oral Daily  . polyethylene glycol  17 g Oral Daily  . tamsulosin  0.8 mg Oral Daily  . thiamine  100 mg Oral Daily  . vitamin B-12  1,000 mcg Oral Daily   Continuous Infusions:  Antimicrobials: Anti-infectives (From admission, onward)   Start     Dose/Rate Route Frequency Ordered Stop   05/15/20 1230  Ledipasvir-Sofosbuvir 90-400 MG TABS 1 tablet  Status:  Discontinued        1 tablet Oral Daily 05/15/20 1223 05/30/20 1436   05/15/20 0900  vancomycin (VANCOREADY) IVPB 750 mg/150 mL  Status:  Discontinued        750 mg 150 mL/hr over 60 Minutes Intravenous Every 12 hours 05/14/20 2251 05/16/20 1809   05/15/20 0800  ceFEPIme (MAXIPIME) 2 g in sodium chloride 0.9 % 100 mL IVPB  Status:  Discontinued        2 g 200 mL/hr over 30 Minutes Intravenous Every 12 hours 05/14/20 2250 05/15/20 1321   05/14/20 2300  vancomycin (VANCOCIN) IVPB 1000 mg/200 mL premix        1,000 mg 200 mL/hr over 60 Minutes Intravenous  Once 05/14/20 2250 05/15/20 0156   05/14/20 2245  ceFEPIme (MAXIPIME) 2 g in sodium chloride 0.9 %  100 mL IVPB  Status:  Discontinued        2 g 200 mL/hr over 30 Minutes Intravenous  Once 05/14/20 2244 05/14/20 2248   05/14/20 2245  vancomycin (VANCOCIN) IVPB 1000 mg/200 mL premix  Status:  Discontinued        1,000 mg 200 mL/hr over 60 Minutes Intravenous  Once 05/14/20 2244 05/14/20 2248   05/14/20 2245  metroNIDAZOLE (FLAGYL) tablet 500 mg  Status:  Discontinued        500 mg Oral Every 8 hours 05/14/20 2244 05/14/20 2248   05/14/20 1930  ceFEPIme (MAXIPIME) 2 g in sodium chloride 0.9 % 100 mL IVPB        2 g 200 mL/hr over 30 Minutes Intravenous  Once 05/14/20 1923 05/14/20 2106  05/14/20 1930  metroNIDAZOLE (FLAGYL) IVPB 500 mg        500 mg 100 mL/hr over 60 Minutes Intravenous  Once 05/14/20 1923 05/14/20 2106   05/14/20 1930  vancomycin (VANCOCIN) IVPB 1000 mg/200 mL premix        1,000 mg 200 mL/hr over 60 Minutes Intravenous  Once 05/14/20 1923 05/14/20 2219     Objective: Vitals: Today's Vitals   06/13/20 0006 06/13/20 0556 06/13/20 0611 06/13/20 0753  BP: 130/72 (!) 142/69  136/87  Pulse: 77 73  81  Resp: 17 17  18   Temp: 98.6 F (37 C) 98.4 F (36.9 C)  98 F (36.7 C)  TempSrc:      SpO2: 97% 97%  99%  Weight:   73 kg   Height:      PainSc: 0-No pain       Intake/Output Summary (Last 24 hours) at 06/13/2020 1156 Last data filed at 06/13/2020 1020 Gross per 24 hour  Intake 360 ml  Output 1150 ml  Net -790 ml   Filed Weights   05/27/20 0322 06/11/20 0500 06/13/20 0611  Weight: 77.5 kg 73.2 kg 73 kg   Weight change:   Intake/Output from previous day: 01/03 0701 - 01/04 0700 In: 0  Out: 1300 [Urine:800] Intake/Output this shift: Total I/O In: 360 [P.O.:360] Out: 250 [Urine:250]  Examination: General exam: AAOx2 , NAD, weak appearing. HEENT:Oral mucosa moist, Ear/Nose WNL grossly, dentition normal. Respiratory system: bilaterallyclear,no wheezing or crackles,no use of accessory muscle Cardiovascular system: S1 & S2 +, No JVD,. Gastrointestinal  system: Abdomen soft, NT,ND, BS+ Nervous System:Alert, awake, moving extremities and grossly nonfocal Extremities: No edema, distal peripheral pulses palpable.  Skin: No rashes,no icterus. MSK: Normal muscle bulk,tone, power    Data Reviewed: I have personally reviewed following labs and imaging studies CBC: Recent Labs  Lab 06/07/20 0721  WBC 8.5  HGB 11.3*  HCT 33.3*  MCV 90.0  PLT 285   Basic Metabolic Panel: Recent Labs  Lab 06/07/20 0721  NA 135  K 4.1  CL 100  CO2 27  GLUCOSE 164*  BUN 17  CREATININE 0.80  CALCIUM 8.8*   GFR: Estimated Creatinine Clearance: 83.5 mL/min (by C-G formula based on SCr of 0.8 mg/dL). Liver Function Tests: No results for input(s): AST, ALT, ALKPHOS, BILITOT, PROT, ALBUMIN in the last 168 hours. No results for input(s): LIPASE, AMYLASE in the last 168 hours. No results for input(s): AMMONIA in the last 168 hours. Coagulation Profile: No results for input(s): INR, PROTIME in the last 168 hours. Cardiac Enzymes: No results for input(s): CKTOTAL, CKMB, CKMBINDEX, TROPONINI in the last 168 hours. BNP (last 3 results) No results for input(s): PROBNP in the last 8760 hours. HbA1C: No results for input(s): HGBA1C in the last 72 hours. CBG: Recent Labs  Lab 06/11/20 2011 06/12/20 0744 06/12/20 1134 06/12/20 1610 06/13/20 0819  GLUCAP 283* 184* 230* 204* 189*   Lipid Profile: No results for input(s): CHOL, HDL, LDLCALC, TRIG, CHOLHDL, LDLDIRECT in the last 72 hours. Thyroid Function Tests: No results for input(s): TSH, T4TOTAL, FREET4, T3FREE, THYROIDAB in the last 72 hours. Anemia Panel: No results for input(s): VITAMINB12, FOLATE, FERRITIN, TIBC, IRON, RETICCTPCT in the last 72 hours. Sepsis Labs: No results for input(s): PROCALCITON, LATICACIDVEN in the last 168 hours.  Recent Results (from the past 240 hour(s))  MRSA PCR Screening     Status: Abnormal   Collection Time: 06/04/20  6:37 AM   Specimen: Nasal Mucosa;  Nasopharyngeal  Result Value Ref Range Status   MRSA by PCR POSITIVE (A) NEGATIVE Final    Comment:        The GeneXpert MRSA Assay (FDA approved for NASAL specimens only), is one component of a comprehensive MRSA colonization surveillance program. It is not intended to diagnose MRSA infection nor to guide or monitor treatment for MRSA infections. RESULT CALLED TO, READ BACK BY AND VERIFIED WITH:  Caryl Asp [facility] AT 0800 06/05/20 SDR Performed at [facility], [address], [place], [place] [address]      Radiology Studies: No results found.   LOS: 26 days   Antonieta Pert, MD [facility]  06/13/2020, 11:56 AM

## 2020-06-13 NOTE — TOC Progression Note (Signed)
Transition of Care [facility]) - Progression Note    Patient Details  Name: Lance [name] MRN: [id number] Date of Birth: 1945/11/04  Transition of Care [facility]) CM/SW Contact  Liliana Cline, LCSW Phone Number: 06/13/2020, 4:06 PM  Clinical Narrative:   CSW called [employer] to follow up on appeal. Attempted call to Cristal Deer (number given yesterday by Kelly Splinter), no answer and left him a third voicemail requesting a return call. Called Member Services line. Spoke with [employer]. Annice Pih reported there still has not been a decision made on appeal. She reported they will fax results, confirmed they have TOC Fax #. Asked estimate on when decision will be made, Annice Pih reported she does not have that information.     Expected Discharge Plan: Skilled Nursing Facility Barriers to Discharge: Continued Medical Work up  Expected Discharge Plan and Services Expected Discharge Plan: Skilled Nursing Facility In-house Referral: Clinical Social Work   Post Acute Care Choice: Skilled Nursing Facility Living arrangements for the past 2 months: Single Family Home                                       Social Determinants of Health (SDOH) Interventions    Readmission Risk Interventions No flowsheet data found.

## 2020-06-13 NOTE — Progress Notes (Signed)
Nutrition Follow-up  DOCUMENTATION CODES:   Non-severe (moderate) malnutrition in context of social or environmental circumstances  INTERVENTION:   Ensure Enlive po TID, each supplement provides 350 kcal and 20 grams of protein  MVI daily  NUTRITION DIAGNOSIS:   Moderate Malnutrition related to social / environmental circumstances as evidenced by percent weight loss,moderate muscle depletion,mild fat depletion.  GOAL:   Patient will meet greater than or equal to 90% of their needs -met   MONITOR:   PO intake,Supplement acceptance,Labs,Weight trends,Skin,I & O's  ASSESSMENT:   75 y.o. male with medical history significant for hepatitis C was treated with Harvoni, insulin-dependent diabetes mellitus, exposure to HIV, schizophrenia, meningioma, depression and substance abuse who is admitted with AMS and possible seizure   Pt continues to have good appetite and oral intake; pt eating 100% of meals and is drinking Ensure supplements. Per chart, pt is down ~19lbs(11%) since admit; this is significant. Pt does appear to be fairly weight stable over the past two weeks. Pt currently awaiting SNF placement.    Medications reviewed and include: celexa, lovenox, ferrous sulfate, insulin, MVI, miralax, thiamine   Labs reviewed: cbgs- 189, 219 x 24 hrs  Diet Order:   Diet Order            Diet Carb Modified Fluid consistency: Thin; Room service appropriate? Yes  Diet effective now                EDUCATION NEEDS:   No education needs have been identified at this time  Skin:  Skin Assessment: Reviewed RN Assessment  Last BM:  1/4- type 6  Height:   Ht Readings from Last 1 Encounters:  05/17/20 5' 9"  (1.753 m)    Weight:   Wt Readings from Last 1 Encounters:  06/13/20 73 kg    Ideal Body Weight:  72.7 kg  BMI:  Body mass index is 23.78 kg/m.  Estimated Nutritional Needs:   Kcal:  1900-2200kcal/day  Protein:  95-110g/day  Fluid:  >1.9L/day  Koleen Distance  MS, RD, LDN Please refer to [facility] for RD and/or RD on-call/weekend/after hours pager

## 2020-06-14 DIAGNOSIS — R531 Weakness: Secondary | ICD-10-CM | POA: Diagnosis not present

## 2020-06-14 DIAGNOSIS — E1165 Type 2 diabetes mellitus with hyperglycemia: Secondary | ICD-10-CM | POA: Diagnosis not present

## 2020-06-14 DIAGNOSIS — G9341 Metabolic encephalopathy: Secondary | ICD-10-CM | POA: Diagnosis not present

## 2020-06-14 DIAGNOSIS — I618 Other nontraumatic intracerebral hemorrhage: Secondary | ICD-10-CM | POA: Diagnosis not present

## 2020-06-14 LAB — CBC
HCT: 32.3 % — ABNORMAL LOW (ref 39.0–52.0)
Hemoglobin: 10.6 g/dL — ABNORMAL LOW (ref 13.0–17.0)
MCH: 30.1 pg (ref 26.0–34.0)
MCHC: 32.8 g/dL (ref 30.0–36.0)
MCV: 91.8 fL (ref 80.0–100.0)
Platelets: 209 10*3/uL (ref 150–400)
RBC: 3.52 MIL/uL — ABNORMAL LOW (ref 4.22–5.81)
RDW: 12.6 % (ref 11.5–15.5)
WBC: 7.7 10*3/uL (ref 4.0–10.5)
nRBC: 0 % (ref 0.0–0.2)

## 2020-06-14 LAB — GLUCOSE, CAPILLARY
Glucose-Capillary: 135 mg/dL — ABNORMAL HIGH (ref 70–99)
Glucose-Capillary: 204 mg/dL — ABNORMAL HIGH (ref 70–99)
Glucose-Capillary: 152 mg/dL — ABNORMAL HIGH (ref 70–99)
Glucose-Capillary: 288 mg/dL — ABNORMAL HIGH (ref 70–99)

## 2020-06-14 LAB — BASIC METABOLIC PANEL
Anion gap: 7 (ref 5–15)
BUN: 22 mg/dL (ref 8–23)
CO2: 28 mmol/L (ref 22–32)
Calcium: 8.8 mg/dL — ABNORMAL LOW (ref 8.9–10.3)
Chloride: 102 mmol/L (ref 98–111)
Creatinine, Ser: 0.89 mg/dL (ref 0.61–1.24)
GFR, Estimated: >60 mL/min (ref >60)
Glucose, Bld: 123 mg/dL — ABNORMAL HIGH (ref 70–99)
Potassium: 4.1 mmol/L (ref 3.5–5.1)
Sodium: 137 mmol/L (ref 135–145)

## 2020-06-14 NOTE — Progress Notes (Signed)
Physical Therapy Treatment Patient Details Name: Lance [name] MRN: [id number] DOB: 30-Sep-1945 Today's Date: 06/14/2020    History of Present Illness Pt is a 74yo M admitted to [facility] on 05/14/20 because of concerns for AMS; friends found pt unresponsive on the ground and called EMS. Per EMS, CBG in 500's. Blood work concerning for leukocytosis and lactic acidosis. 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 PMH significant includes: T2DM, Hep C, schizophrenia, and sepsis.    PT Comments    The pt is making good progress towards goals this session. He was able to progress to ambulate >200' without AD and close supervision. With dual task activities, such as gait with head turns the pt demonstrates balance deficits. It is expected that he would continue to be appropriate for 24/7 supervision, if not STR at SNF.    Follow Up Recommendations  Supervision/Assistance - 24 hour;Supervision for mobility/OOB;SNF;Other (comment)     Equipment Recommendations  None recommended by PT    Recommendations for Other Services       Precautions / Restrictions Precautions Precautions: Fall Restrictions Weight Bearing Restrictions: No    Mobility  Bed Mobility Overal bed mobility: Independent Bed Mobility: Supine to Sit;Sit to Supine Rolling: Independent   Supine to sit: Supervision Sit to supine: Supervision   General bed mobility comments: Pt able to complete 5x STS with no UE assistance or additional physical assistance.  Transfers Overall transfer level: Needs assistance Equipment used: None   Sit to Stand: Supervision Stand pivot transfers: Supervision       General transfer comment: Pt completing transfers with spervision assistance, no overt LOB.  Ambulation/Gait Ambulation/Gait assistance: Supervision Gait Distance (Feet): 400  Feet Assistive device: None Gait Pattern/deviations: Shuffle;Narrow base of support Gait velocity: slightly decreased; unable to complete formal gait speed.       Stairs             Wheelchair Mobility    Modified Rankin (Stroke Patients Only)       Balance Overall balance assessment: Needs assistance Sitting-balance support: No upper extremity supported;Feet unsupported Sitting balance-Leahy Scale: Good Sitting balance - Comments: no LOB sitting EOB   Standing balance support: No upper extremity supported;During functional activity Standing balance-Leahy Scale: Good Standing balance comment: Functional balance improvements, however requires close supervision or CGA for dynamic balance activity.                            Cognition Arousal/Alertness: Awake/alert Behavior During Therapy: WFL for tasks assessed/performed Overall Cognitive Status: No family/caregiver present to determine baseline cognitive functioning Area of Impairment: Orientation;Safety/judgement                 Orientation Level: Disoriented to;Time Current Attention Level: Sustained   Following Commands: Follows multi-step commands consistently;Follows multi-step commands with increased time Safety/Judgement: Decreased awareness of deficits Awareness: Emergent   General Comments: Pt following all commands with no increased cues, just need for increased time.      Exercises      General Comments        Pertinent Vitals/Pain Pain Assessment: 0-10 Faces Pain Scale: Hurts whole lot Pain Location: low back pain; reports that it is worse in the morning. Pain Descriptors / Indicators: Discomfort;Constant Pain Intervention(s): Monitored during session;Limited activity within patient's tolerance    Home Living  Prior Function            PT Goals (current goals can now be found in the care plan section) Acute Rehab PT Goals Patient Stated  Goal: to get up without help PT Goal Formulation: With patient Time For Goal Achievement: 06/09/20 Potential to Achieve Goals: Good Progress towards PT goals: Progressing toward goals    Frequency    Min 2X/week      PT Plan Current plan remains appropriate    Co-evaluation              AM-PAC PT "6 Clicks" Mobility   Outcome Measure  Help needed turning from your back to your side while in a flat bed without using bedrails?: None Help needed moving from lying on your back to sitting on the side of a flat bed without using bedrails?: None Help needed moving to and from a bed to a chair (including a wheelchair)?: A Little Help needed standing up from a chair using your arms (e.g., wheelchair or bedside chair)?: A Little Help needed to walk in hospital room?: A Little Help needed climbing 3-5 steps with a railing? : A Little 6 Click Score: 20    End of Session Equipment Utilized During Treatment: Gait belt Activity Tolerance: Patient tolerated treatment well Patient left: in chair;with chair alarm set Nurse Communication: Mobility status PT Visit Diagnosis: Unsteadiness on feet (R26.81);Difficulty in walking, not elsewhere classified (R26.2)     Time: 0277-4128 PT Time Calculation (min) (ACUTE ONLY): 35 min  Charges:  $Gait Training: 23-37 mins           11:07 AM, 06/14/20 Lance [name] A. Lance [name] PT, DPT Physical Therapist - [facility]    Lance [name] A Lance [name] 06/14/2020, 11:05 AM

## 2020-06-14 NOTE — Progress Notes (Signed)
Patient ID: Lance [name], male   DOB: [birth year], 75 y.o.   MRN: [id number] [facility] PROGRESS NOTE  Lance [name] [id number] DOB: 03-06-1946 DOA: 05/14/2020 PCP: Lance Burrow, MD  HPI/Subjective: Patient feeling okay.  I did see him walking around with physical therapy and did better than what he was previously doing last week when I saw him.  He offers no complaints.  Objective: Vitals:   06/14/20 0506 06/14/20 1157  BP: (!) 134/95 119/70  Pulse: 82 83  Resp: 20   Temp: 98.2 F (36.8 C) 98.2 F (36.8 C)  SpO2: 99% 100%    Intake/Output Summary (Last 24 hours) at 06/14/2020 1312 Last data filed at 06/14/2020 0945 Gross per 24 hour  Intake 1440 ml  Output 1100 ml  Net 340 ml   Filed Weights   05/27/20 0322 06/11/20 0500 06/13/20 0611  Weight: 77.5 kg 73.2 kg 73 kg    ROS: Review of Systems  Respiratory: Negative for shortness of breath.   Cardiovascular: Negative for chest pain.  Gastrointestinal: Negative for abdominal pain, nausea and vomiting.   Exam: Physical Exam HENT:     Head: Normocephalic.     Mouth/Throat:     Pharynx: No oropharyngeal exudate.  Eyes:     General: Lids are normal.     Conjunctiva/sclera: Conjunctivae normal.  Cardiovascular:     Rate and Rhythm: Normal rate and regular rhythm.     Heart sounds: Normal heart sounds, S1 normal and S2 normal.  Pulmonary:     Breath sounds: No decreased breath sounds, wheezing, rhonchi or rales.  Abdominal:     Palpations: Abdomen is soft.     Tenderness: There is no abdominal tenderness.  Musculoskeletal:     Right lower leg: No swelling.     Left lower leg: No swelling.  Skin:    General: Skin is warm.     Findings: No rash.  Neurological:     Mental Status: He is alert.     Comments: Able to straight leg raise without a problem.  Answers questions appropriately       Data Reviewed: Basic Metabolic Panel: Recent Labs  Lab 06/14/20 0333  NA 137  K 4.1  CL 102   CO2 28  GLUCOSE 123*  BUN 22  CREATININE 0.89  CALCIUM 8.8*   CBC: Recent Labs  Lab 06/14/20 0333  WBC 7.7  HGB 10.6*  HCT 32.3*  MCV 91.8  PLT 209    CBG: Recent Labs  Lab 06/13/20 1215 06/13/20 1557 06/13/20 2006 06/14/20 0841 06/14/20 1216  GLUCAP 219* 188* 123* 135* 204*     Scheduled Meds: . Chlorhexidine Gluconate Cloth  6 each Topical Daily  . citalopram  10 mg Oral Daily  . enoxaparin (LOVENOX) injection  40 mg Subcutaneous Q24H  . feeding supplement  237 mL Oral TID BM  . ferrous sulfate  325 mg Oral Q breakfast  . insulin aspart  0-15 Units Subcutaneous TID WC  . insulin aspart  0-5 Units Subcutaneous QHS  . insulin aspart  4 Units Subcutaneous TID WC  . insulin detemir  18 Units Subcutaneous Q2200  . multivitamin with minerals  1 tablet Oral Daily  . polyethylene glycol  17 g Oral Daily  . tamsulosin  0.8 mg Oral Daily  . thiamine  100 mg Oral Daily  . vitamin B-12  1,000 mcg Oral Daily    Assessment/Plan:  1. Acute metabolic encephalopathy.  Mental status better than it was  last week.  Still do not think he be able to take care of himself at home by himself.  MRI of the brain showed innumerable chronic microhemorrhages suggestive of amyloid angiopathy, large meningioma and bilateral subdural hematomas.  Patient was seen by neurosurgery during the hospital course. 2. Weakness.  Physical therapy recommended rehab.  Patient's daughter still would like him to go to rehab if insurance company approves.  Still awaiting the appeal process. 3. Type 2 diabetes mellitus uncontrolled with hemoglobin A1c elevated at 10.8.  Patient on detemir insulin 18 units daily with sliding scale. 4. Functional decline.  Continue working with physical therapy and Occupational Therapy while here.  Still awaiting appeal process with insurance company. 5. Hepatitis C infection treated with Harvoni previously 6. BPH on Flomax 7. Depression/schizophrenia.  On  Celexa. 8. Substance abuse with cocaine prior to coming into the hospital.      Code Status:     Code Status Orders  (From admission, onward)         Start     Ordered   05/14/20 2242  Full code  Continuous        05/14/20 2244        Code Status History    Date Active Date Inactive Code Status Order ID Comments User Context   10/10/2016 0947 10/14/2016 1918 Full Code [id number]  Lance Loll, MD ED   Advance Care Planning Activity     Family Communication: Spoke with patient's daughter on the phone Disposition Plan: Status is: Inpatient  Dispo: The patient is from: Home by himself              Anticipated d/c is to: Depending on appeal process will either go out to rehab or home with home health.              Anticipated d/c date is: Whenever we hear back from the insurance company.              Patient currently medically stable for disposition out to rehab.  Time spent: 26 minutes  Lance [name] [employer]

## 2020-06-15 DIAGNOSIS — I618 Other nontraumatic intracerebral hemorrhage: Secondary | ICD-10-CM | POA: Diagnosis not present

## 2020-06-15 DIAGNOSIS — R531 Weakness: Secondary | ICD-10-CM | POA: Diagnosis not present

## 2020-06-15 DIAGNOSIS — E1165 Type 2 diabetes mellitus with hyperglycemia: Secondary | ICD-10-CM | POA: Diagnosis not present

## 2020-06-15 DIAGNOSIS — G9341 Metabolic encephalopathy: Secondary | ICD-10-CM | POA: Diagnosis not present

## 2020-06-15 LAB — GLUCOSE, CAPILLARY
Glucose-Capillary: 96 mg/dL (ref 70–99)
Glucose-Capillary: 229 mg/dL — ABNORMAL HIGH (ref 70–99)

## 2020-06-15 MED ORDER — POLYETHYLENE GLYCOL 3350 17 G PO PACK: 17.0000 g | PACK | Freq: Every day | ORAL | 0 refills | Status: AC | PRN

## 2020-06-15 MED ORDER — POLYVINYL ALCOHOL 1.4 % OP SOLN: 1.0000 [drp] | OPHTHALMIC | 0 refills | Status: AC | PRN

## 2020-06-15 MED ORDER — TAMSULOSIN HCL 0.4 MG PO CAPS: 0.8000 mg | ORAL_CAPSULE | Freq: Every day | ORAL | 0 refills | Status: DC

## 2020-06-15 MED ORDER — INSULIN PEN NEEDLE 34G X 3.5 MM MISC: 1.0000 | Freq: Three times a day (TID) | 0 refills | Status: DC

## 2020-06-15 MED ORDER — INSULIN ASPART 100 UNIT/ML FLEXPEN: PEN_INJECTOR | SUBCUTANEOUS | 0 refills | Status: DC

## 2020-06-15 MED ORDER — THIAMINE HCL 100 MG PO TABS: 100.0000 mg | ORAL_TABLET | Freq: Every day | ORAL | 0 refills | Status: DC

## 2020-06-15 MED ORDER — ALBUTEROL SULFATE HFA 108 (90 BASE) MCG/ACT IN AERS: 2.0000 | INHALATION_SPRAY | Freq: Four times a day (QID) | RESPIRATORY_TRACT | 0 refills | Status: DC | PRN

## 2020-06-15 MED ORDER — ADULT MULTIVITAMIN W/MINERALS CH: 1.0000 | ORAL_TABLET | Freq: Every day | ORAL | 0 refills | Status: DC

## 2020-06-15 MED ORDER — INSULIN DETEMIR 100 UNIT/ML FLEXPEN: 18.0000 [IU] | PEN_INJECTOR | Freq: Every day | SUBCUTANEOUS | 1 refills | Status: DC

## 2020-06-15 MED ORDER — CYANOCOBALAMIN 1000 MCG PO TABS: 1000.0000 ug | ORAL_TABLET | Freq: Every day | ORAL | 0 refills | Status: DC

## 2020-06-15 MED ORDER — CITALOPRAM HYDROBROMIDE 10 MG PO TABS: 10.0000 mg | ORAL_TABLET | Freq: Every day | ORAL | 0 refills | Status: DC

## 2020-06-15 MED ORDER — FERROUS SULFATE 325 (65 FE) MG PO TABS: 325.0000 mg | ORAL_TABLET | Freq: Every day | ORAL | 0 refills | Status: DC

## 2020-06-15 MED ORDER — ENSURE ENLIVE PO LIQD: 237.0000 mL | Freq: Three times a day (TID) | ORAL | 0 refills | Status: DC

## 2020-06-15 NOTE — Care Management Important Message (Signed)
Important Message  Patient Details  Name: FORTUNE BRANNIGAN MRN: [id number] Date of Birth: 1945/10/19   Medicare Important Message Given:  Yes     Olegario Messier A Masiah Woody 06/15/2020, 2:11 PM

## 2020-06-15 NOTE — TOC Progression Note (Addendum)
Transition of Care [facility]) - Progression Note    Patient Details  Name: Lance [name] MRN: [id number] Date of Birth: [birth year]  Transition of Care [facility]) CM/SW Contact  Liliana Cline, LCSW Phone Number: 06/15/2020, 10:07 AM  Clinical Narrative:   After discussion with Supervisor, patient no longer appropriate for SNF rehab due to improved mobility (walking >200 feet with PT). CSW attempted call to patient's daughter Alona Bene to discuss DC plans, no answer and voicemail full. Will try again later. Will work on [facility] coverage.  11:25- MD was able to reach daughter and explained DC home with [facility], per MD daughter was agreeable with this. HH arranged through [facility]- RN, PT, OT, Aide, SW.   Expected Discharge Plan: Skilled Nursing Facility Barriers to Discharge: Continued Medical Work up  Expected Discharge Plan and Services Expected Discharge Plan: Skilled Nursing Facility In-house Referral: Clinical Social Work   Post Acute Care Choice: Skilled Nursing Facility Living arrangements for the past 2 months: Single Family Home                                       Social Determinants of Health (SDOH) Interventions    Readmission Risk Interventions No flowsheet data found.

## 2020-06-15 NOTE — Discharge Summary (Signed)
Lance [name] at Lance [name] NAME: Lance [name]    MR#:  [id number]  DATE OF BIRTH:  Sep 16, 1945  DATE OF ADMISSION:  05/14/2020 ADMITTING PHYSICIAN: Lance Swayze, DO  DATE OF DISCHARGE: 06/16/2019  PRIMARY CARE PHYSICIAN: [name], Lance Jarvis, MD    ADMISSION DIAGNOSIS:  Lactic acidosis [E87.2] Lower urinary tract infectious disease [N39.0] Syncope [R55] Sepsis ([place]) [A41.9] Altered mental status, unspecified altered mental status type [R41.82]  DISCHARGE DIAGNOSIS:  Principal Problem:   Subacute delirium Active Problems:   Hepatitis C virus infection without hepatic coma   Type 2 diabetes mellitus with hyperglycemia, without long-term current use of insulin (HCC)   Sepsis (HCC)   Syncope   Malnutrition of moderate degree   Acute metabolic encephalopathy   Silent micro-hemorrhage of brain ([place])   Functional assessment declined   Weakness   SECONDARY DIAGNOSIS:   Past Medical History:  Diagnosis Date  . Diabetes mellitus without complication ([place])   . Hepatitis C   . Schizophrenia ([place])     HOSPITAL COURSE:   1.  Acute metabolic encephalopathy.  Patient initially came in and had an MRI of the brain that showed innumerable chronic microhemorrhages suggestive of amyloid angioplasty, large meningioma and bilateral subdural hematomas.  Patient will need neurosurgical follow-up as outpatient.  Patient's mental status has gradually improved during the hospital course.  We were first awaiting rehab but patient's mental status has improved where he is able to walk around better with physical therapy.  Patient able to feed himself.  Patient's daughter is a Marine scientist and able to inject insulin if needed. 2.  Weakness.  Physical therapy did initially recommend rehab.  Patient's insurance company had rejected rehab.  Patient's daughter did go through the appeal process.  In the meantime the patient was doing better with physical therapy and walked  around better.  We will set up home health at home. 3.  Type 2 diabetes mellitus uncontrolled with hemoglobin A1c elevated at 10.8.  Patient on detemir insulin 18units daily with short acting insulin prior to meals. 4.  Functional decline.  Continue physical therapy and Occupational Therapy as outpatient. 5.  Hepatitis C infection treated previously with Harvoni. 6.  BPH on Flomax 7.  Depression schizophrenia.  Continue Celexa 8.  Substance abuse with cocaine prior to coming into the hospital. 9.  Enterobacter UTI treated during the hospital course earlier. 10.  Lactic acidosis earlier in the hospital course secondary to Metformin  DISCHARGE CONDITIONS:   Fair  CONSULTS OBTAINED:  Treatment Team:  Lance Perla, MD  DRUG ALLERGIES:  No Known Allergies  DISCHARGE MEDICATIONS:   Allergies as of 06/15/2020   No Known Allergies     Medication List    STOP taking these medications   acetaminophen 325 MG tablet Commonly known as: TYLENOL   alum & mag hydroxide-simeth [id number] MG/5ML suspension Commonly known as: MAALOX/MYLANTA   buprenorphine-naloxone 8-2 mg Subl SL tablet Commonly known as: SUBOXONE   dicyclomine 20 MG tablet Commonly known as: BENTYL   docusate sodium 100 MG capsule Commonly known as: COLACE   gabapentin 800 MG tablet Commonly known as: NEURONTIN   guaiFENesin 100 MG/5ML liquid Commonly known as: ROBITUSSIN   Harvoni 90-400 MG Tabs Generic drug: Ledipasvir-Sofosbuvir   hydroxypropyl methylcellulose / hypromellose 2.5 % ophthalmic solution Commonly known as: ISOPTO TEARS / GONIOVISC   Januvia 100 MG tablet Generic drug: sitaGLIPtin   loperamide 2 MG capsule Commonly known as: IMODIUM   magnesium hydroxide  400 MG/5ML suspension Commonly known as: MILK OF MAGNESIA   metFORMIN 1000 MG tablet Commonly known as: GLUCOPHAGE   OLANZapine 5 MG tablet Commonly known as: ZYPREXA   sucralfate 1 g tablet Commonly known as: CARAFATE    triamcinolone 0.025 % cream Commonly known as: KENALOG   Triple Antibiotic [phone] Oint     TAKE these medications   albuterol 108 (90 Base) MCG/ACT inhaler Commonly known as: VENTOLIN HFA Inhale 2 puffs into the lungs every 6 (six) hours as needed for wheezing or shortness of breath. What changed: how much to take   citalopram 10 MG tablet Commonly known as: CELEXA Take 1 tablet (10 mg total) by mouth daily.   cyanocobalamin 1000 MCG tablet Take 1 tablet (1,000 mcg total) by mouth daily. Start taking on: June 16, 2020   feeding supplement Liqd Take 237 mLs by mouth 3 (three) times daily between meals.   ferrous sulfate 325 (65 FE) MG tablet Take 1 tablet (325 mg total) by mouth daily with breakfast.   insulin aspart 100 UNIT/ML FlexPen Commonly known as: NOVOLOG 4 units subcutaneous injection with meals (okay to substitute any short acting insulin pen that is covered)   insulin detemir 100 UNIT/ML FlexPen Commonly known as: LEVEMIR Inject 18 Units into the skin at bedtime. What changed:  how much to take how to take this when to take this   Insulin Pen Needle 34G X 3.5 MM Misc 1 Dose by Does not apply route 4 (four) times daily -  before meals and at bedtime.   multivitamin with minerals Tabs tablet Take 1 tablet by mouth daily. Start taking on: June 16, 2020   polyethylene glycol 17 g packet Commonly known as: MIRALAX / GLYCOLAX Take 17 g by mouth daily as needed for mild constipation.   polyvinyl alcohol 1.4 % ophthalmic solution Commonly known as: Artificial Tears Place 1 drop into both eyes as needed for dry eyes. What changed:  medication strength how much to take how to take this reasons to take this   tamsulosin 0.4 MG Caps capsule Commonly known as: FLOMAX Take 2 capsules (0.8 mg total) by mouth daily.   thiamine 100 MG tablet Take 1 tablet (100 mg total) by mouth daily. Start taking on: June 16, 2020        DISCHARGE  INSTRUCTIONS:   Follow-up PCP 1 week Follow-up Dr. Lacinda [name] neurosurgery 1 month  If you experience worsening of your admission symptoms, develop shortness of breath, life threatening emergency, suicidal or homicidal thoughts you must seek medical attention immediately by calling 911 or calling your MD immediately  if symptoms less severe.  You Must read complete instructions/literature along with all the possible adverse reactions/side effects for all the Medicines you take and that have been prescribed to you. Take any new Medicines after you have completely understood and accept all the possible adverse reactions/side effects.   Please note  You were cared for by a hospitalist during your hospital stay. If you have any questions about your discharge medications or the care you received while you were in the hospital after you are discharged, you can call the unit and asked to speak with the hospitalist on call if the hospitalist that took care of you is not available. Once you are discharged, your primary care physician will handle any further medical issues. Please note that NO REFILLS for any discharge medications will be authorized once you are discharged, as it is imperative that you return to your  primary care physician (or establish a relationship with a primary care physician if you do not have one) for your aftercare needs so that they can reassess your need for medications and monitor your lab values.    Today   CHIEF COMPLAINT:   Chief Complaint  Patient presents with  . Altered Mental Status    last well unknown    HISTORY OF PRESENT ILLNESS:  Lance [name]  is a 75 y.o. male brought in 31 days ago with altered mental status   VITAL SIGNS:  Blood pressure (!) 138/92, pulse 73, temperature 98.5 F (36.9 C), resp. rate 18, height 5\' 9"  (1.753 m), weight 73 kg, SpO2 100 %.  I/O:    Intake/Output Summary (Last 24 hours) at 06/15/2020 1400 Last data filed at 06/15/2020  0300 Gross per 24 hour  Intake 240 ml  Output 800 ml  Net -560 ml    PHYSICAL EXAMINATION:  GENERAL:  75 y.o.-year-old patient lying in the bed with no acute distress.  EYES: Pupils equal, round, reactive to light and accommodation. No scleral icterus.  HEENT: Head atraumatic, normocephalic. Oropharynx and nasopharynx clear.   LUNGS: Normal breath sounds bilaterally, no wheezing, rales,rhonchi or crepitation. No use of accessory muscles of respiration.  CARDIOVASCULAR: S1, S2 normal. No murmurs, rubs, or gallops.  ABDOMEN: Soft, non-tender, non-distended.  EXTREMITIES: No pedal edema.  NEUROLOGIC: Cranial nerves II through XII are intact. Muscle strength 5/5 in all extremities. Sensation intact. Gait not checked.  PSYCHIATRIC: The patient is alert and oriented x 3.  SKIN: No obvious rash, lesion, or ulcer.   DATA REVIEW:   CBC Recent Labs  Lab 06/14/20 0333  WBC 7.7  HGB 10.6*  HCT 32.3*  PLT 209    Chemistries  Recent Labs  Lab 06/14/20 0333  NA 137  K 4.1  CL 102  CO2 28  GLUCOSE 123*  BUN 22  CREATININE 0.89  CALCIUM 8.8*    Microbiology Results  Results for orders placed or performed during the hospital encounter of 05/14/20  Urine culture     Status: Abnormal   Collection Time: 05/14/20  5:20 PM   Specimen: Urine, Random  Result Value Ref Range Status   Specimen Description   Final    URINE, RANDOM Performed at [facility], [address], [place], [place] [address]    Special Requests   Final    NONE Performed at [place] [facility], [address] [place], [place] [address]    Culture 70,000 COLONIES/mL ENTEROBACTER CLOACAE (A)  Final   Report Status 05/17/2020 FINAL  Final   Organism ID, Bacteria ENTEROBACTER CLOACAE (A)  Final      Susceptibility   Enterobacter cloacae - MIC*    CEFAZOLIN >=64 RESISTANT Resistant     CEFEPIME <=0.12 SENSITIVE Sensitive     CIPROFLOXACIN <=0.25 SENSITIVE Sensitive     GENTAMICIN <=1 SENSITIVE  Sensitive     IMIPENEM 1 SENSITIVE Sensitive     NITROFURANTOIN 128 RESISTANT Resistant     TRIMETH/SULFA <=20 SENSITIVE Sensitive     PIP/TAZO 8 SENSITIVE Sensitive     * 70,000 COLONIES/mL ENTEROBACTER CLOACAE  Blood culture (routine x 2)     Status: None   Collection Time: 05/14/20  6:22 PM   Specimen: BLOOD  Result Value Ref Range Status   Specimen Description BLOOD LEFT HAND  Final   Special Requests   Final    BOTTLES DRAWN AEROBIC AND ANAEROBIC Blood Culture adequate volume  Culture   Final    NO GROWTH 5 DAYS Performed at [facility], [address] [place], [place] [address]    Report Status 05/19/2020 FINAL  Final  Blood culture (routine x 2)     Status: None   Collection Time: 05/14/20  6:23 PM   Specimen: BLOOD  Result Value Ref Range Status   Specimen Description BLOOD RIGHT FA  Final   Special Requests   Final    BOTTLES DRAWN AEROBIC AND ANAEROBIC Blood Culture adequate volume   Culture   Final    NO GROWTH 5 DAYS Performed at [facility], [address] [place], [place] [address]    Report Status 05/19/2020 FINAL  Final  Resp Panel by RT-PCR (Flu A&B, Covid) Nasopharyngeal Swab     Status: None   Collection Time: 05/14/20  8:03 PM   Specimen: Nasopharyngeal Swab; Nasopharyngeal(NP) swabs in vial transport medium  Result Value Ref Range Status   SARS Coronavirus 2 by RT PCR NEGATIVE NEGATIVE Final    Comment: (NOTE) SARS-CoV-2 target nucleic acids are NOT DETECTED.  The SARS-CoV-2 RNA is generally detectable in upper respiratory specimens during the acute phase of infection. The lowest concentration of SARS-CoV-2 viral copies this assay can detect is 138 copies/mL. A negative result does not preclude SARS-Cov-2 infection and should not be used as the sole basis for treatment or other patient management decisions. A negative result may occur with  improper specimen collection/handling, submission of specimen other than nasopharyngeal  swab, presence of viral mutation(s) within the areas targeted by this assay, and inadequate number of viral copies(<138 copies/mL). A negative result must be combined with clinical observations, patient history, and epidemiological information. The expected result is Negative.  Fact Sheet for Patients:  EntrepreneurPulse.com.au  Fact Sheet for Healthcare Providers:  IncredibleEmployment.be  This test is no t yet approved or cleared by the [place] FDA and  has been authorized for detection and/or diagnosis of SARS-CoV-2 by FDA under an Emergency Use Authorization (EUA). This EUA will remain  in effect (meaning this test can be used) for the duration of the COVID-19 declaration under Section 564(b)(1) of the Act, 21 U.S.C.section 360bbb-3(b)(1), unless the authorization is terminated  or revoked sooner.       Influenza A by PCR NEGATIVE NEGATIVE Final   Influenza B by PCR NEGATIVE NEGATIVE Final    Comment: (NOTE) The Xpert Xpress SARS-CoV-2/FLU/RSV plus assay is intended as an aid in the diagnosis of influenza from Nasopharyngeal swab specimens and should not be used as a sole basis for treatment. Nasal washings and aspirates are unacceptable for Xpert Xpress SARS-CoV-2/FLU/RSV testing.  Fact Sheet for Patients: EntrepreneurPulse.com.au  Fact Sheet for Healthcare Providers: IncredibleEmployment.be  This test is not yet approved or cleared by the [place] FDA and has been authorized for detection and/or diagnosis of SARS-CoV-2 by FDA under an Emergency Use Authorization (EUA). This EUA will remain in effect (meaning this test can be used) for the duration of the COVID-19 declaration under Section 564(b)(1) of the Act, 21 U.S.C. section 360bbb-3(b)(1), unless the authorization is terminated or revoked.  Performed at [facility], [address] [place], [place] [address]   Resp  Panel by RT-PCR (Flu A&B, Covid) Nasopharyngeal Swab     Status: None   Collection Time: 06/01/20 10:25 AM   Specimen: Nasopharyngeal Swab; Nasopharyngeal(NP) swabs in vial transport medium  Result Value Ref Range Status   SARS Coronavirus 2 by RT PCR NEGATIVE NEGATIVE  Final    Comment: (NOTE) SARS-CoV-2 target nucleic acids are NOT DETECTED.  The SARS-CoV-2 RNA is generally detectable in upper respiratory specimens during the acute phase of infection. The lowest concentration of SARS-CoV-2 viral copies this assay can detect is 138 copies/mL. A negative result does not preclude SARS-Cov-2 infection and should not be used as the sole basis for treatment or other patient management decisions. A negative result may occur with  improper specimen collection/handling, submission of specimen other than nasopharyngeal swab, presence of viral mutation(s) within the areas targeted by this assay, and inadequate number of viral copies(<138 copies/mL). A negative result must be combined with clinical observations, patient history, and epidemiological information. The expected result is Negative.  Fact Sheet for Patients:  BloggerCourse.com  Fact Sheet for Healthcare Providers:  SeriousBroker.it  This test is no t yet approved or cleared by the [place] FDA and  has been authorized for detection and/or diagnosis of SARS-CoV-2 by FDA under an Emergency Use Authorization (EUA). This EUA will remain  in effect (meaning this test can be used) for the duration of the COVID-19 declaration under Section 564(b)(1) of the Act, 21 U.S.C.section 360bbb-3(b)(1), unless the authorization is terminated  or revoked sooner.       Influenza A by PCR NEGATIVE NEGATIVE Final   Influenza B by PCR NEGATIVE NEGATIVE Final    Comment: (NOTE) The Xpert Xpress SARS-CoV-2/FLU/RSV plus assay is intended as an aid in the diagnosis of influenza from Nasopharyngeal  swab specimens and should not be used as a sole basis for treatment. Nasal washings and aspirates are unacceptable for Xpert Xpress SARS-CoV-2/FLU/RSV testing.  Fact Sheet for Patients: BloggerCourse.com  Fact Sheet for Healthcare Providers: SeriousBroker.it  This test is not yet approved or cleared by the [place] FDA and has been authorized for detection and/or diagnosis of SARS-CoV-2 by FDA under an Emergency Use Authorization (EUA). This EUA will remain in effect (meaning this test can be used) for the duration of the COVID-19 declaration under Section 564(b)(1) of the Act, 21 U.S.C. section 360bbb-3(b)(1), unless the authorization is terminated or revoked.  Performed at [facility], [address] [place], [place] [address]   MRSA PCR Screening     Status: Abnormal   Collection Time: 06/04/20  6:37 AM   Specimen: Nasal Mucosa; Nasopharyngeal  Result Value Ref Range Status   MRSA by PCR POSITIVE (A) NEGATIVE Final    Comment:        The GeneXpert MRSA Assay (FDA approved for NASAL specimens only), is one component of a comprehensive MRSA colonization surveillance program. It is not intended to diagnose MRSA infection nor to guide or monitor treatment for MRSA infections. RESULT CALLED TO, READ BACK BY AND VERIFIED WITH:  Ander Slade [facility] AT 0800 06/05/20 SDR Performed at [facility], [address], [place], [place] [address]      Management plans discussed with the patient, family and they are in agreement.  CODE STATUS:     Code Status Orders  (From admission, onward)         Start     Ordered   05/14/20 2242  Full code  Continuous        05/14/20 2244        Code Status History    Date Active Date Inactive Code Status Order ID Comments User Context   10/10/2016 0947 10/14/2016 1918 Full Code [id number]  Milagros Loll, MD ED   Advance Care Planning Activity  TOTAL TIME  TAKING CARE OF THIS PATIENT: 35 minutes.    Loletha Grayer M.D on 06/15/2020 at 2:00 PM  Between 7am to 6pm - Pager - [phone]  After 6pm go to [email or web address] - password [email or web address]  [facility]  CC: Primary care physician; [name], Lance Jarvis, MD

## 2020-07-11 ENCOUNTER — Ambulatory Visit: Payer: Medicaid Other | Admitting: Podiatry

## 2020-07-18 ENCOUNTER — Encounter: Payer: Self-pay | Admitting: Podiatry

## 2020-07-18 ENCOUNTER — Other Ambulatory Visit: Payer: Self-pay

## 2020-07-18 ENCOUNTER — Ambulatory Visit (INDEPENDENT_AMBULATORY_CARE_PROVIDER_SITE_OTHER): Payer: Medicare HMO | Admitting: Podiatry

## 2020-07-18 DIAGNOSIS — Z7189 Other specified counseling: Secondary | ICD-10-CM

## 2020-07-18 DIAGNOSIS — B351 Tinea unguium: Secondary | ICD-10-CM | POA: Diagnosis not present

## 2020-07-18 DIAGNOSIS — E1142 Type 2 diabetes mellitus with diabetic polyneuropathy: Secondary | ICD-10-CM | POA: Diagnosis not present

## 2020-07-18 DIAGNOSIS — M79675 Pain in left toe(s): Secondary | ICD-10-CM

## 2020-07-18 DIAGNOSIS — M79674 Pain in right toe(s): Secondary | ICD-10-CM

## 2020-07-20 ENCOUNTER — Encounter: Payer: Self-pay | Admitting: Podiatry

## 2020-07-20 NOTE — Progress Notes (Signed)
  Subjective:  Patient ID: Lance [name], male    DOB: [birth year],  MRN: [id number]  Chief Complaint  Patient presents with  . Nail Problem    Nail, callous trim DFC,  He c/o numbness, tingling feet bilat x months.  He says they feel cold at times  . Diabetes   75 y.o. male returns for the above complaint.  Patient presents with thickened elongated dystrophic toenails x10.  There is some tingling and numbness going on to both of the foot.  Patient would like to have the nails debrided down.  He also has secondary complaint of neuropathy secondary diabetes.  His last A1c was 10.8.  He denies any other acute complaints.  Objective:  There were no vitals filed for this visit. Podiatric Exam: Vascular: dorsalis pedis and posterior tibial pulses are palpable bilateral. Capillary return is immediate. Temperature gradient is WNL. Skin turgor WNL  Sensorium: Decreased Semmes Weinstein monofilament test.  Decreased tactile sensation bilaterally. Nail Exam: Pt has thick disfigured discolored nails with subungual debris noted bilateral entire nail hallux through fifth toenails.  Pain on palpation to the nails. Ulcer Exam: There is no evidence of ulcer or pre-ulcerative changes or infection. Orthopedic Exam: Muscle tone and strength are WNL. No limitations in general ROM. No crepitus or effusions noted. HAV  B/L.  Hammer toes 2-5  B/L. Skin: No Porokeratosis. No infection or ulcers    Assessment & Plan:   1. Pain due to onychomycosis of toenails of both feet   2. Diabetic polyneuropathy associated with type 2 diabetes mellitus ([place])   3. Diabetes education, encounter for     Patient was evaluated and treated and all questions answered.  Diabetic education with underlying neuropathy -I explained to the patient the etiology of neuropathy and its relationship with diabetes.  I encouraged him to control his sugars/glucose management which will help reduce some of the neuropathic pain that he is  having.  I discussed with him the importance of diet control as well as taking his medication on time.  Patient states understanding and he will continue to work on that to improve his glucose management.  Onychomycosis with pain  -Nails palliatively debrided as below. -Educated on self-care  Procedure: Nail Debridement Rationale: pain  Type of Debridement: manual, sharp debridement. Instrumentation: Nail nipper, rotary burr. Number of Nails: 10  Procedures and Treatment: Consent by patient was obtained for treatment procedures. The patient understood the discussion of treatment and procedures well. All questions were answered thoroughly reviewed. Debridement of mycotic and hypertrophic toenails, 1 through 5 bilateral and clearing of subungual debris. No ulceration, no infection noted.  Return Visit-Office Procedure: Patient instructed to return to the office for a follow up visit 3 months for continued evaluation and treatment.  Boneta Lucks, DPM    No follow-ups on file.

## 2020-07-28 ENCOUNTER — Telehealth (INDEPENDENT_AMBULATORY_CARE_PROVIDER_SITE_OTHER): Payer: Self-pay | Admitting: Gastroenterology

## 2020-07-28 ENCOUNTER — Other Ambulatory Visit: Payer: Self-pay

## 2020-07-28 DIAGNOSIS — Z1211 Encounter for screening for malignant neoplasm of colon: Secondary | ICD-10-CM

## 2020-07-28 MED ORDER — NA SULFATE-K SULFATE-MG SULF 17.5-3.13-1.6 GM/177ML PO SOLN: 1.0000 | Freq: Once | ORAL | 0 refills | Status: AC

## 2020-07-28 NOTE — Progress Notes (Signed)
Gastroenterology Pre-Procedure Review  Request Date: Tuesday 08/15/20 Requesting Physician: Dr. Allen Norris  PATIENT REVIEW QUESTIONS: The patient responded to the following health history questions as indicated:    1. Are you having any GI issues? yes (occasional blood has been noted by patient upon wiping after bowel movement) 2. Do you have a personal history of Polyps? Yes noted in problem list 08/30/13 adenomatous polyps 3. Do you have a family history of Colon Cancer or Polyps? no 4. Diabetes Mellitus? yes (type 2) 5. Joint replacements in the past 12 months?no 6. Major health problems in the past 3 months?05/14/20 Admission for AMS, UTI 7. Any artificial heart valves, MVP, or defibrillator?no    MEDICATIONS & ALLERGIES:    Patient reports the following regarding taking any anticoagulation/antiplatelet therapy:   Plavix, Coumadin, Eliquis, Xarelto, Lovenox, Pradaxa, Brilinta, or Effient? no Aspirin? no  Patient confirms/reports the following medications:  Current Outpatient Medications  Medication Sig Dispense Refill  . acetaminophen (TYLENOL) 650 MG CR tablet Take by mouth.    Marland Kitchen albuterol (VENTOLIN HFA) 108 (90 Base) MCG/ACT inhaler Inhale 2 puffs into the lungs every 6 (six) hours as needed for wheezing or shortness of breath. 18 g 0  . citalopram (CELEXA) 10 MG tablet Take 1 tablet (10 mg total) by mouth daily. 30 tablet 0  . diclofenac Sodium (VOLTAREN) 1 % GEL Apply topically.    . feeding supplement (ENSURE ENLIVE / ENSURE PLUS) LIQD Take 237 mLs by mouth 3 (three) times daily between meals. 21330 mL 0  . ferrous sulfate 325 (65 FE) MG tablet Take 1 tablet (325 mg total) by mouth daily with breakfast. 30 tablet 0  . insulin aspart (NOVOLOG) 100 UNIT/ML FlexPen 4 units subcutaneous injection with meals (okay to substitute any short acting insulin pen that is covered) 15 mL 0  . insulin detemir (LEVEMIR) 100 UNIT/ML FlexPen Inject 18 Units into the skin at bedtime. 15 mL 1  .  Insulin Pen Needle 34G X 3.5 MM MISC 1 Dose by Does not apply route 4 (four) times daily -  before meals and at bedtime. 200 each 0  . Multiple Vitamin (MULTI-VITAMIN) tablet Take 1 tablet by mouth daily.    . Na Sulfate-K Sulfate-Mg Sulf 17.5-3.13-1.6 GM/177ML SOLN Take 1 kit by mouth once for 1 dose. 354 mL 0  . OLANZapine (ZYPREXA) 5 MG tablet Take by mouth.    . polyethylene glycol (MIRALAX / GLYCOLAX) 17 g packet Take 17 g by mouth daily as needed for mild constipation. 30 each 0  . polyvinyl alcohol (ARTIFICIAL TEARS) 1.4 % ophthalmic solution Place 1 drop into both eyes as needed for dry eyes. 15 mL 0  . sucralfate (CARAFATE) 1 g tablet Take by mouth.    . tamsulosin (FLOMAX) 0.4 MG CAPS capsule Take 2 capsules (0.8 mg total) by mouth daily. 60 capsule 0  . thiamine 100 MG tablet Take 1 tablet (100 mg total) by mouth daily. 30 tablet 0  . vitamin B-12 1000 MCG tablet Take 1 tablet (1,000 mcg total) by mouth daily. 30 tablet 0  . atorvastatin (LIPITOR) 20 MG tablet Take 20 mg by mouth daily.     No current facility-administered medications for this visit.    Patient confirms/reports the following allergies:  No Known Allergies  Orders Placed This Encounter  Procedures  . Procedural/ Surgical Case Request: COLONOSCOPY WITH PROPOFOL    Standing Status:   Standing    Number of Occurrences:   1    Order  Specific Question:   Pre-op diagnosis    Answer:   screening colonoscopy    Order Specific Question:   CPT Code    Answer:   10289    AUTHORIZATION INFORMATION Primary Insurance: 1D#: Group #:  Secondary Insurance: 1D#: Group #:  SCHEDULE INFORMATION: Date: 08/15/20 Time: Location:[facility]

## 2020-07-31 ENCOUNTER — Telehealth: Payer: Medicare HMO

## 2020-08-11 ENCOUNTER — Other Ambulatory Visit: Payer: Self-pay

## 2020-08-11 ENCOUNTER — Other Ambulatory Visit
Admission: RE | Admit: 2020-08-11 | Discharge: 2020-08-11 | Disposition: A | Discharge status: Home / Self Care | Payer: Medicare HMO | Source: Ambulatory Visit | Attending: Gastroenterology | Admitting: Gastroenterology

## 2020-08-11 DIAGNOSIS — Z01812 Encounter for preprocedural laboratory examination: Secondary | ICD-10-CM | POA: Insufficient documentation

## 2020-08-11 DIAGNOSIS — Z20822 Contact with and (suspected) exposure to covid-19: Secondary | ICD-10-CM | POA: Diagnosis not present

## 2020-08-12 LAB — SARS CORONAVIRUS 2 (TAT 6-24 HRS): SARS Coronavirus 2: NEGATIVE

## 2020-08-15 ENCOUNTER — Encounter: Payer: Self-pay | Admitting: Gastroenterology

## 2020-08-15 ENCOUNTER — Other Ambulatory Visit: Payer: Self-pay

## 2020-08-15 ENCOUNTER — Ambulatory Visit: Payer: Medicare HMO | Admitting: Certified Registered"

## 2020-08-15 ENCOUNTER — Encounter
Admission: RE | Disposition: A | Discharge status: Home / Self Care | Payer: Self-pay | Source: Home / Self Care | Attending: Gastroenterology

## 2020-08-15 ENCOUNTER — Ambulatory Visit
Admission: RE | Admit: 2020-08-15 | Discharge: 2020-08-15 | Disposition: A | Discharge status: Home / Self Care | Payer: Medicare HMO | Attending: Gastroenterology | Admitting: Gastroenterology

## 2020-08-15 DIAGNOSIS — Z833 Family history of diabetes mellitus: Secondary | ICD-10-CM | POA: Diagnosis not present

## 2020-08-15 DIAGNOSIS — Z8619 Personal history of other infectious and parasitic diseases: Secondary | ICD-10-CM | POA: Diagnosis not present

## 2020-08-15 DIAGNOSIS — F17200 Nicotine dependence, unspecified, uncomplicated: Secondary | ICD-10-CM | POA: Insufficient documentation

## 2020-08-15 DIAGNOSIS — Z1211 Encounter for screening for malignant neoplasm of colon: Secondary | ICD-10-CM

## 2020-08-15 DIAGNOSIS — E119 Type 2 diabetes mellitus without complications: Secondary | ICD-10-CM | POA: Diagnosis not present

## 2020-08-15 DIAGNOSIS — Z5309 Procedure and treatment not carried out because of other contraindication: Secondary | ICD-10-CM | POA: Diagnosis not present

## 2020-08-15 DIAGNOSIS — Z79899 Other long term (current) drug therapy: Secondary | ICD-10-CM | POA: Insufficient documentation

## 2020-08-15 DIAGNOSIS — Z794 Long term (current) use of insulin: Secondary | ICD-10-CM | POA: Diagnosis not present

## 2020-08-15 DIAGNOSIS — Z791 Long term (current) use of non-steroidal anti-inflammatories (NSAID): Secondary | ICD-10-CM | POA: Insufficient documentation

## 2020-08-15 HISTORY — PX: COLONOSCOPY WITH PROPOFOL: SHX5780

## 2020-08-15 LAB — URINE DRUG SCREEN, QUALITATIVE (ARMC ONLY)
Amphetamines, Ur Screen: NOT DETECTED
Barbiturates, Ur Screen: NOT DETECTED
Benzodiazepine, Ur Scrn: NOT DETECTED
Cannabinoid 50 Ng, Ur ~~LOC~~: NOT DETECTED
Cocaine Metabolite,Ur ~~LOC~~: POSITIVE — AB
MDMA (Ecstasy)Ur Screen: NOT DETECTED
Methadone Scn, Ur: NOT DETECTED
Opiate, Ur Screen: NOT DETECTED
Phencyclidine (PCP) Ur S: NOT DETECTED
Tricyclic, Ur Screen: NOT DETECTED

## 2020-08-15 SURGERY — COLONOSCOPY WITH PROPOFOL
Anesthesia: General

## 2020-08-15 MED ORDER — PROPOFOL 500 MG/50ML IV EMUL
INTRAVENOUS | Status: AC
  Filled 2020-08-15: qty 50

## 2020-08-15 MED ORDER — SODIUM CHLORIDE 0.9 % IV SOLN: INTRAVENOUS | Status: DC

## 2020-08-15 NOTE — Progress Notes (Signed)
Patient positive for cocaine. Procedure cancelled per anesthesia

## 2020-08-15 NOTE — Anesthesia Preprocedure Evaluation (Addendum)
Anesthesia Evaluation  Patient identified by MRN, date of birth, ID band Patient awake    Reviewed: Allergy & Precautions, NPO status , Patient's Chart, lab work & pertinent test results  History of Anesthesia Complications Negative for: history of anesthetic complications  Airway Mallampati: II       Dental  (+) Upper Dentures, Lower Dentures   Pulmonary neg sleep apnea, neg COPD, Current Smoker,           Cardiovascular (-) hypertension(-) Past MI and (-) CHF (-) dysrhythmias (-) Valvular Problems/Murmurs     Neuro/Psych neg Seizures Depression Schizophrenia    GI/Hepatic GERD  Medicated and Controlled,(+)     substance abuse  cocaine use, Hepatitis -, C  Endo/Other  diabetes, Type 2, Oral Hypoglycemic Agents  Renal/GU      Musculoskeletal   Abdominal   Peds  Hematology   Anesthesia Other Findings   Reproductive/Obstetrics                            Anesthesia Physical Anesthesia Plan  ASA: III  Anesthesia Plan: General   Post-op Pain Management:    Induction: Intravenous  PONV Risk Score and Plan: 1 and Propofol infusion and TIVA  Airway Management Planned: Nasal Cannula  Additional Equipment:   Intra-op Plan:   Post-operative Plan:   Informed Consent: I have reviewed the patients History and Physical, chart, labs and discussed the procedure including the risks, benefits and alternatives for the proposed anesthesia with the patient or authorized representative who has indicated his/her understanding and acceptance.       Plan Discussed with:   Anesthesia Plan Comments: (Pt positive for cocaine, will need to cancel for today.)       Anesthesia Quick Evaluation

## 2020-08-15 NOTE — H&P (Signed)
Lucilla Lame, MD [address] [place], [place] [address] Phone: [phone] Fax : [phone]  Primary Care Physician:  Theotis Burrow, MD Primary Gastroenterologist:  Dr. Allen Norris  Pre-Procedure History & Physical: HPI:  Lance [name] is a 75 y.o. male is here for a screening colonoscopy.   Past Medical History:  Diagnosis Date  . Diabetes mellitus without complication ([place])   . Hepatitis C   . Schizophrenia [facility])     Past Surgical History:  Procedure Laterality Date  . ESOPHAGOGASTRODUODENOSCOPY (EGD) WITH PROPOFOL N/A 10/10/2016   Procedure: ESOPHAGOGASTRODUODENOSCOPY (EGD) WITH PROPOFOL;  Surgeon: Lucilla Lame, MD;  Location: [facility] ENDOSCOPY;  Service: Endoscopy;  Laterality: N/A;  . HERNIA REPAIR    . VISCERAL ARTERY INTERVENTION N/A 10/11/2016   Procedure: Visceral Artery Intervention;  Surgeon: Algernon Huxley, MD;  Location: [facility];  Service: Cardiovascular;  Laterality: N/A;    Prior to Admission medications   Medication Sig Start Date End Date Taking? Authorizing Provider  acetaminophen (TYLENOL) 650 MG CR tablet Take by mouth.   Yes [provider]  albuterol (VENTOLIN HFA) 108 (90 Base) MCG/ACT inhaler Inhale 2 puffs into the lungs every 6 (six) hours as needed for wheezing or shortness of breath. 06/15/20  Yes Wieting, Richard, MD  atorvastatin (LIPITOR) 20 MG tablet Take 20 mg by mouth daily. 07/19/20  Yes [provider]  citalopram (CELEXA) 10 MG tablet Take 1 tablet (10 mg total) by mouth daily. 06/15/20  Yes Wieting, Richard, MD  diclofenac Sodium (VOLTAREN) 1 % GEL Apply topically. 03/08/15  Yes [provider]  feeding supplement (ENSURE ENLIVE / ENSURE PLUS) LIQD Take 237 mLs by mouth 3 (three) times daily between meals. 06/15/20  Yes Loletha Grayer, MD  ferrous sulfate 325 (65 FE) MG tablet Take 1 tablet (325 mg total) by mouth daily with breakfast. 06/15/20  Yes Leslye Peer, Richard, MD  insulin aspart (NOVOLOG)  100 UNIT/ML FlexPen 4 units subcutaneous injection with meals (okay to substitute any short acting insulin pen that is covered) 06/15/20  Yes Wieting, Richard, MD  insulin detemir (LEVEMIR) 100 UNIT/ML FlexPen Inject 18 Units into the skin at bedtime. 06/15/20  Yes Wieting, Richard, MD  Multiple Vitamin (MULTI-VITAMIN) tablet Take 1 tablet by mouth daily.   Yes [provider]  OLANZapine (ZYPREXA) 5 MG tablet Take by mouth.   Yes [provider]  polyethylene glycol (MIRALAX / GLYCOLAX) 17 g packet Take 17 g by mouth daily as needed for mild constipation. 06/15/20  Yes Wieting, Richard, MD  polyvinyl alcohol (ARTIFICIAL TEARS) 1.4 % ophthalmic solution Place 1 drop into both eyes as needed for dry eyes. 06/15/20  Yes Wieting, Richard, MD  sucralfate (CARAFATE) 1 g tablet Take by mouth.   Yes [provider]  tamsulosin (FLOMAX) 0.4 MG CAPS capsule Take 2 capsules (0.8 mg total) by mouth daily. 06/15/20  Yes Wieting, Richard, MD  thiamine 100 MG tablet Take 1 tablet (100 mg total) by mouth daily. 06/16/20  Yes Wieting, Richard, MD  vitamin B-12 1000 MCG tablet Take 1 tablet (1,000 mcg total) by mouth daily. 06/16/20  Yes Wieting, Richard, MD  Insulin Pen Needle 34G X 3.5 MM MISC 1 Dose by Does not apply route 4 (four) times daily -  before meals and at bedtime. 06/15/20   Loletha Grayer, MD    Allergies as of 07/28/2020  . (No Known Allergies)    Family History  Problem Relation Age of Onset  .  Diabetes Sister     Social History   Socioeconomic History  . Marital status: Widowed    Spouse name: Not on file  . Number of children: Not on file  . Years of education: Not on file  . Highest education level: Not on file  Occupational History  . Not on file  Tobacco Use  . Smoking status: Current Every Day Smoker    Packs/day: 0.25    Years: 10.00    Pack years: 2.50  . Smokeless tobacco: Never Used  Vaping Use  . Vaping Use: Never used  Substance and Sexual Activity  .  Alcohol use: Not Currently    Alcohol/week: 10.0 standard drinks    Types: 10 Shots of liquor per week  . Drug use: No  . Sexual activity: Yes    Birth control/protection: None  Other Topics Concern  . Not on file  Social History Narrative  . Not on file   Social Determinants of Health   Financial Resource Strain: Not on file  Food Insecurity: Not on file  Transportation Needs: Not on file  Physical Activity: Not on file  Stress: Not on file  Social Connections: Not on file  Intimate Partner Violence: Not on file    Review of Systems: See HPI, otherwise negative ROS  Physical Exam: BP (!) 152/91   Pulse 88   Temp 98.1 F (36.7 C) (Temporal)   Resp 17   Ht 5\' 9"  (1.753 m)   Wt 77.1 kg   SpO2 100%   BMI 25.10 kg/m  General:   Alert,  pleasant and cooperative in NAD Head:  Normocephalic and atraumatic. Neck:  Supple; no masses or thyromegaly. Lungs:  Clear throughout to auscultation.    Heart:  Regular rate and rhythm. Abdomen:  Soft, nontender and nondistended. Normal bowel sounds, without guarding, and without rebound.   Neurologic:  Alert and  oriented x4;  grossly normal neurologically.  Impression/Plan: Lance [name] is now here to undergo a screening colonoscopy.  Risks, benefits, and alternatives regarding colonoscopy have been reviewed with the patient.  Questions have been answered.  All parties agreeable.

## 2020-08-16 ENCOUNTER — Encounter: Payer: Self-pay | Admitting: Gastroenterology

## 2020-10-14 ENCOUNTER — Inpatient Hospital Stay (HOSPITAL_COMMUNITY)
Admission: EM | Admit: 2020-10-14 | Discharge: 2020-10-19 | DRG: 917 | Disposition: A | Discharge status: Rehabilitation Facility | Payer: Medicare HMO | Attending: Internal Medicine | Admitting: Internal Medicine

## 2020-10-14 ENCOUNTER — Emergency Department: Payer: Medicare HMO

## 2020-10-14 ENCOUNTER — Other Ambulatory Visit: Payer: Self-pay

## 2020-10-14 ENCOUNTER — Emergency Department
Admission: EM | Admit: 2020-10-14 | Discharge: 2020-10-14 | Payer: Medicare HMO | Attending: Emergency Medicine | Admitting: Emergency Medicine

## 2020-10-14 DIAGNOSIS — E854 Organ-limited amyloidosis: Secondary | ICD-10-CM | POA: Diagnosis present

## 2020-10-14 DIAGNOSIS — E1151 Type 2 diabetes mellitus with diabetic peripheral angiopathy without gangrene: Secondary | ICD-10-CM | POA: Diagnosis present

## 2020-10-14 DIAGNOSIS — E1140 Type 2 diabetes mellitus with diabetic neuropathy, unspecified: Secondary | ICD-10-CM | POA: Insufficient documentation

## 2020-10-14 DIAGNOSIS — E785 Hyperlipidemia, unspecified: Secondary | ICD-10-CM | POA: Diagnosis present

## 2020-10-14 DIAGNOSIS — R531 Weakness: Secondary | ICD-10-CM | POA: Diagnosis present

## 2020-10-14 DIAGNOSIS — Z833 Family history of diabetes mellitus: Secondary | ICD-10-CM | POA: Diagnosis not present

## 2020-10-14 DIAGNOSIS — Z20822 Contact with and (suspected) exposure to covid-19: Secondary | ICD-10-CM | POA: Diagnosis present

## 2020-10-14 DIAGNOSIS — D320 Benign neoplasm of cerebral meninges: Secondary | ICD-10-CM | POA: Diagnosis present

## 2020-10-14 DIAGNOSIS — E119 Type 2 diabetes mellitus without complications: Secondary | ICD-10-CM | POA: Diagnosis not present

## 2020-10-14 DIAGNOSIS — G8194 Hemiplegia, unspecified affecting left nondominant side: Secondary | ICD-10-CM | POA: Diagnosis present

## 2020-10-14 DIAGNOSIS — I6389 Other cerebral infarction: Secondary | ICD-10-CM | POA: Diagnosis not present

## 2020-10-14 DIAGNOSIS — G935 Compression of brain: Secondary | ICD-10-CM | POA: Diagnosis present

## 2020-10-14 DIAGNOSIS — I619 Nontraumatic intracerebral hemorrhage, unspecified: Secondary | ICD-10-CM | POA: Diagnosis present

## 2020-10-14 DIAGNOSIS — I10 Essential (primary) hypertension: Secondary | ICD-10-CM | POA: Diagnosis not present

## 2020-10-14 DIAGNOSIS — F17200 Nicotine dependence, unspecified, uncomplicated: Secondary | ICD-10-CM | POA: Diagnosis not present

## 2020-10-14 DIAGNOSIS — I69254 Hemiplegia and hemiparesis following other nontraumatic intracranial hemorrhage affecting left non-dominant side: Secondary | ICD-10-CM | POA: Diagnosis not present

## 2020-10-14 DIAGNOSIS — W182XXA Fall in (into) shower or empty bathtub, initial encounter: Secondary | ICD-10-CM | POA: Diagnosis not present

## 2020-10-14 DIAGNOSIS — I680 Cerebral amyloid angiopathy: Secondary | ICD-10-CM | POA: Diagnosis present

## 2020-10-14 DIAGNOSIS — Z79899 Other long term (current) drug therapy: Secondary | ICD-10-CM

## 2020-10-14 DIAGNOSIS — Z794 Long term (current) use of insulin: Secondary | ICD-10-CM

## 2020-10-14 DIAGNOSIS — T405X1A Poisoning by cocaine, accidental (unintentional), initial encounter: Principal | ICD-10-CM | POA: Diagnosis present

## 2020-10-14 DIAGNOSIS — I5032 Chronic diastolic (congestive) heart failure: Secondary | ICD-10-CM | POA: Diagnosis present

## 2020-10-14 DIAGNOSIS — B1920 Unspecified viral hepatitis C without hepatic coma: Secondary | ICD-10-CM | POA: Diagnosis present

## 2020-10-14 DIAGNOSIS — M542 Cervicalgia: Secondary | ICD-10-CM | POA: Insufficient documentation

## 2020-10-14 DIAGNOSIS — R262 Difficulty in walking, not elsewhere classified: Secondary | ICD-10-CM | POA: Diagnosis present

## 2020-10-14 DIAGNOSIS — G8110 Spastic hemiplegia affecting unspecified side: Secondary | ICD-10-CM | POA: Diagnosis not present

## 2020-10-14 DIAGNOSIS — I110 Hypertensive heart disease with heart failure: Secondary | ICD-10-CM | POA: Diagnosis present

## 2020-10-14 DIAGNOSIS — G441 Vascular headache, not elsewhere classified: Secondary | ICD-10-CM | POA: Diagnosis not present

## 2020-10-14 DIAGNOSIS — I161 Hypertensive emergency: Secondary | ICD-10-CM | POA: Diagnosis present

## 2020-10-14 DIAGNOSIS — F17210 Nicotine dependence, cigarettes, uncomplicated: Secondary | ICD-10-CM | POA: Diagnosis present

## 2020-10-14 DIAGNOSIS — E1165 Type 2 diabetes mellitus with hyperglycemia: Secondary | ICD-10-CM | POA: Diagnosis present

## 2020-10-14 DIAGNOSIS — G928 Other toxic encephalopathy: Secondary | ICD-10-CM | POA: Diagnosis present

## 2020-10-14 DIAGNOSIS — F209 Schizophrenia, unspecified: Secondary | ICD-10-CM | POA: Diagnosis present

## 2020-10-14 DIAGNOSIS — G936 Cerebral edema: Secondary | ICD-10-CM | POA: Diagnosis present

## 2020-10-14 DIAGNOSIS — I611 Nontraumatic intracerebral hemorrhage in hemisphere, cortical: Secondary | ICD-10-CM | POA: Diagnosis present

## 2020-10-14 DIAGNOSIS — F1410 Cocaine abuse, uncomplicated: Secondary | ICD-10-CM | POA: Diagnosis present

## 2020-10-14 DIAGNOSIS — M21372 Foot drop, left foot: Secondary | ICD-10-CM | POA: Diagnosis present

## 2020-10-14 DIAGNOSIS — R471 Dysarthria and anarthria: Secondary | ICD-10-CM | POA: Diagnosis present

## 2020-10-14 LAB — CBC WITH DIFFERENTIAL/PLATELET
Abs Immature Granulocytes: 0.04 10*3/uL (ref 0.00–0.07)
Basophils Absolute: 0 10*3/uL (ref 0.0–0.1)
Basophils Relative: 0 %
Eosinophils Absolute: 0.1 10*3/uL (ref 0.0–0.5)
Eosinophils Relative: 1 %
HCT: 39.4 % (ref 39.0–52.0)
Hemoglobin: 13.2 g/dL (ref 13.0–17.0)
Immature Granulocytes: 0 %
Lymphocytes Relative: 18 %
Lymphs Abs: 2 10*3/uL (ref 0.7–4.0)
MCH: 30.3 pg (ref 26.0–34.0)
MCHC: 33.5 g/dL (ref 30.0–36.0)
MCV: 90.6 fL (ref 80.0–100.0)
Monocytes Absolute: 0.7 10*3/uL (ref 0.1–1.0)
Monocytes Relative: 7 %
Neutro Abs: 8.1 10*3/uL — ABNORMAL HIGH (ref 1.7–7.7)
Neutrophils Relative %: 74 %
Platelets: 170 10*3/uL (ref 150–400)
RBC: 4.35 MIL/uL (ref 4.22–5.81)
RDW: 12.7 % (ref 11.5–15.5)
Smear Review: UNDETERMINED
WBC: 11.1 10*3/uL — ABNORMAL HIGH (ref 4.0–10.5)
nRBC: 0 % (ref 0.0–0.2)

## 2020-10-14 LAB — URINALYSIS, COMPLETE (UACMP) WITH MICROSCOPIC
Bilirubin Urine: NEGATIVE
Glucose, UA: >=500 mg/dL — AB
Ketones, ur: 5 mg/dL — AB
Leukocytes,Ua: NEGATIVE
Nitrite: NEGATIVE
Protein, ur: 100 mg/dL — AB
Specific Gravity, Urine: 1.017 (ref 1.005–1.030)
Squamous Epithelial / HPF: NONE SEEN (ref 0–5)
pH: 5 (ref 5.0–8.0)

## 2020-10-14 LAB — URINE DRUG SCREEN, QUALITATIVE (ARMC ONLY)
Amphetamines, Ur Screen: NOT DETECTED
Barbiturates, Ur Screen: NOT DETECTED
Benzodiazepine, Ur Scrn: NOT DETECTED
Cannabinoid 50 Ng, Ur ~~LOC~~: NOT DETECTED
Cocaine Metabolite,Ur ~~LOC~~: POSITIVE — AB
MDMA (Ecstasy)Ur Screen: NOT DETECTED
Methadone Scn, Ur: NOT DETECTED
Opiate, Ur Screen: NOT DETECTED
Phencyclidine (PCP) Ur S: NOT DETECTED
Tricyclic, Ur Screen: NOT DETECTED

## 2020-10-14 LAB — COMPREHENSIVE METABOLIC PANEL
ALT: 13 U/L (ref 0–44)
AST: 14 U/L — ABNORMAL LOW (ref 15–41)
Albumin: 3.4 g/dL — ABNORMAL LOW (ref 3.5–5.0)
Alkaline Phosphatase: 81 U/L (ref 38–126)
Anion gap: 8 (ref 5–15)
BUN: 17 mg/dL (ref 8–23)
CO2: 26 mmol/L (ref 22–32)
Calcium: 8.9 mg/dL (ref 8.9–10.3)
Chloride: 103 mmol/L (ref 98–111)
Creatinine, Ser: 0.75 mg/dL (ref 0.61–1.24)
GFR, Estimated: >60 mL/min (ref >60)
Glucose, Bld: 167 mg/dL — ABNORMAL HIGH (ref 70–99)
Potassium: 3.8 mmol/L (ref 3.5–5.1)
Sodium: 137 mmol/L (ref 135–145)
Total Bilirubin: 0.6 mg/dL (ref 0.3–1.2)
Total Protein: 7.4 g/dL (ref 6.5–8.1)

## 2020-10-14 LAB — TROPONIN I (HIGH SENSITIVITY)
Troponin I (High Sensitivity): 9 ng/L (ref <18)
Troponin I (High Sensitivity): 9 ng/L (ref <18)

## 2020-10-14 LAB — RESP PANEL BY RT-PCR (FLU A&B, COVID) ARPGX2
Influenza A by PCR: NEGATIVE
Influenza B by PCR: NEGATIVE
SARS Coronavirus 2 by RT PCR: NEGATIVE

## 2020-10-14 LAB — ETHANOL: Alcohol, Ethyl (B): <10 mg/dL (ref <10)

## 2020-10-14 LAB — PROTIME-INR
INR: 1 (ref 0.8–1.2)
Prothrombin Time: 13.3 seconds (ref 11.4–15.2)

## 2020-10-14 LAB — CBG MONITORING, ED: Glucose-Capillary: 183 mg/dL — ABNORMAL HIGH (ref 70–99)

## 2020-10-14 MED ORDER — CLEVIDIPINE BUTYRATE 0.5 MG/ML IV EMUL
0.0000 mg/h | INTRAVENOUS | Status: DC
  Administered 2020-10-14: 16 mg/h via INTRAVENOUS
  Administered 2020-10-14 – 2020-10-15 (×4): 21 mg/h via INTRAVENOUS
  Administered 2020-10-16: 3 mg/h via INTRAVENOUS
  Administered 2020-10-16: 7 mg/h via INTRAVENOUS
  Filled 2020-10-14 (×5): qty 100
  Filled 2020-10-14: qty 300
  Filled 2020-10-14: qty 50
  Filled 2020-10-14: qty 100

## 2020-10-14 MED ORDER — ACETAMINOPHEN 325 MG PO TABS
650.0000 mg | ORAL_TABLET | ORAL | Status: DC | PRN
  Administered 2020-10-15 – 2020-10-18 (×9): 650 mg via ORAL
  Filled 2020-10-14 (×9): qty 2

## 2020-10-14 MED ORDER — NICARDIPINE HCL IN NACL 20-0.86 MG/200ML-% IV SOLN
3.0000 mg/h | INTRAVENOUS | Status: DC
  Administered 2020-10-14: 5 mg/h via INTRAVENOUS
  Filled 2020-10-14: qty 200

## 2020-10-14 MED ORDER — CHLORHEXIDINE GLUCONATE CLOTH 2 % EX PADS
6.0000 | MEDICATED_PAD | Freq: Every day | CUTANEOUS | Status: DC
  Administered 2020-10-15 – 2020-10-17 (×3): 6 via TOPICAL

## 2020-10-14 MED ORDER — PANTOPRAZOLE SODIUM 40 MG IV SOLR
40.0000 mg | Freq: Every day | INTRAVENOUS | Status: DC
  Administered 2020-10-14 – 2020-10-18 (×5): 40 mg via INTRAVENOUS
  Filled 2020-10-14 (×5): qty 40

## 2020-10-14 MED ORDER — ACETAMINOPHEN 650 MG RE SUPP: 650.0000 mg | RECTAL | Status: DC | PRN

## 2020-10-14 MED ORDER — STROKE: EARLY STAGES OF RECOVERY BOOK
Freq: Once | Status: DC
  Filled 2020-10-14: qty 1

## 2020-10-14 MED ORDER — LABETALOL HCL 5 MG/ML IV SOLN
20.0000 mg | Freq: Once | INTRAVENOUS | Status: DC
  Filled 2020-10-14: qty 4

## 2020-10-14 MED ORDER — ACETAMINOPHEN 160 MG/5ML PO SOLN: 650.0000 mg | ORAL | Status: DC | PRN

## 2020-10-14 MED ORDER — SENNOSIDES-DOCUSATE SODIUM 8.6-50 MG PO TABS
1.0000 | ORAL_TABLET | Freq: Two times a day (BID) | ORAL | Status: DC
  Administered 2020-10-15 – 2020-10-19 (×7): 1 via ORAL
  Filled 2020-10-14 (×8): qty 1

## 2020-10-14 MED ORDER — CLEVIDIPINE BUTYRATE 0.5 MG/ML IV EMUL
0.0000 mg/h | INTRAVENOUS | Status: DC
  Administered 2020-10-14: 1 mg/h via INTRAVENOUS
  Filled 2020-10-14: qty 50

## 2020-10-14 NOTE — ED Notes (Signed)
Pt refusing to change into gown.

## 2020-10-14 NOTE — ED Provider Notes (Addendum)
[facility] Emergency Department Provider Note   ____________________________________________   Event Date/Time   First MD Initiated Contact with Patient 10/14/20 1824     (approximate)  I have reviewed the triage vital signs and the nursing notes.   HISTORY  Chief Complaint Weakness    HPI Lance [name] is a 75 y.o. male with past medical history of diabetes, peripheral arterial disease, schizophrenia, and polysubstance abuse who presents to the ED for weakness.  Patient initially states that he is here for neck pain which has been gradually worsening over the past 2 weeks.  He states that over that time he has had increasing weakness in his legs and it has gotten harder for him to walk.  He states he was too weak to get up out of bed this morning and had to crawl around on the ground.  He reports falling on multiple occasions over the past 2 weeks, states he tripped getting out of the bathtub but is not sure whether he hit his head.  He reports that the falls have occurred after the onset of neck pain.  He denies any fevers, cough, chest pain, or shortness of breath.  He has not had any abdominal pain, nausea, vomiting, dysuria, or hematuria.  He denies any difficulty urinating or passing stool, denies numbness in his groin or legs.  Patient mitts to cocaine use 2 days ago.        Past Medical History:  Diagnosis Date  . Diabetes mellitus without complication ([place])   . Hepatitis C   . Schizophrenia [facility])     Patient Active Problem List   Diagnosis Date Noted  . ICH (intracerebral hemorrhage) ([place]) 10/14/2020  . Weakness   . Acute metabolic encephalopathy   . Silent micro-hemorrhage of brain ([place])   . Functional assessment declined   . Malnutrition of moderate degree 05/26/2020  . Subacute delirium 05/16/2020  . Syncope 05/15/2020  . Sepsis ([place]) 05/14/2020  . Leg pain 12/07/2018  . PAD (peripheral artery disease) ([place]) 12/07/2018  . Acute  upper GI bleed 10/10/2016  . Hypotension   . Polysubstance abuse ([place]) 03/10/2015  . Mass of arm 03/10/2015  . 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 03/10/2015  . Perianal abscess 08/31/2014  . 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 Adenomatous polyp of colon 08/30/2013  . Type 2 diabetes mellitus with hyperglycemia, without long-term current use of insulin ([place]) 08/26/2013  . Hepatitis C virus infection without hepatic coma 03/21/2013  . Need for hepatitis B vaccination 03/21/2013  . Depression 10/23/2012  . IVDU (intravenous drug user) 10/23/2012  . Schizophrenia ([place]) 10/23/2012  . Carpal tunnel syndrome on both sides 07/31/2012  . Diabetic peripheral neuropathy ([place]) 07/31/2012  . Osteoarthritis of both hips 07/31/2012  . Esophageal reflux 10/21/2009  . Diverticulitis of colon 10/21/2009    Past Surgical History:  Procedure Laterality Date  . COLONOSCOPY WITH PROPOFOL N/A 08/15/2020   Procedure: COLONOSCOPY WITH PROPOFOL;  Surgeon: Lucilla Lame, MD;  Location: [facility] ENDOSCOPY;  Service: Endoscopy;  Laterality: N/A;  . ESOPHAGOGASTRODUODENOSCOPY (EGD) WITH PROPOFOL N/A 10/10/2016   Procedure: ESOPHAGOGASTRODUODENOSCOPY (EGD) WITH PROPOFOL;  Surgeon: Lucilla Lame, MD;  Location: [facility] ENDOSCOPY;  Service: Endoscopy;  Laterality: N/A;  . HERNIA REPAIR    . VISCERAL ARTERY INTERVENTION N/A 10/11/2016   Procedure: Visceral Artery Intervention;  Surgeon: Algernon Huxley, MD;  Location: [place] CV LAB;  Service: Cardiovascular;  Laterality: N/A;    Prior to Admission medications  Medication Sig Start Date End Date Taking? Authorizing Provider  acetaminophen (TYLENOL) 650 MG CR tablet Take by mouth.    [provider]  albuterol (VENTOLIN HFA) 108 (90 Base) MCG/ACT inhaler Inhale 2 puffs into the lungs every 6 (six) hours as needed for wheezing or shortness of breath. 06/15/20   Loletha Grayer, MD  atorvastatin (LIPITOR) 20 MG tablet Take 20 mg by mouth daily. 07/19/20   [provider]  citalopram (CELEXA) 10 MG tablet Take 1 tablet (10 mg total) by mouth daily. 06/15/20   Loletha Grayer, MD  diclofenac Sodium (VOLTAREN) 1 % GEL Apply topically. 03/08/15   [provider]  feeding supplement (ENSURE ENLIVE / ENSURE PLUS) LIQD Take 237 mLs by mouth 3 (three) times daily between meals. 06/15/20   Loletha Grayer, MD  ferrous sulfate 325 (65 FE) MG tablet Take 1 tablet (325 mg total) by mouth daily with breakfast. 06/15/20   Loletha Grayer, MD  insulin aspart (NOVOLOG) 100 UNIT/ML FlexPen 4 units subcutaneous injection with meals (okay to substitute any short acting insulin pen that is covered) 06/15/20   Loletha Grayer, MD  insulin detemir (LEVEMIR) 100 UNIT/ML FlexPen Inject 18 Units into the skin at bedtime. 06/15/20   Loletha Grayer, MD  Insulin Pen Needle 34G X 3.5 MM MISC 1 Dose by Does not apply route 4 (four) times daily -  before meals and at bedtime. 06/15/20   Loletha Grayer, MD  Multiple Vitamin (MULTI-VITAMIN) tablet Take 1 tablet by mouth daily.    [provider]  OLANZapine (ZYPREXA) 5 MG tablet Take by mouth.    [provider]  polyethylene glycol (MIRALAX / GLYCOLAX) 17 g packet Take 17 g by mouth daily as needed for mild constipation. 06/15/20   Loletha Grayer, MD  polyvinyl alcohol (ARTIFICIAL TEARS) 1.4 % ophthalmic solution Place 1 drop into both eyes as needed for dry eyes. 06/15/20   Loletha Grayer, MD  sucralfate (CARAFATE) 1 g tablet Take by mouth.    [provider]  tamsulosin (FLOMAX) 0.4 MG CAPS capsule Take 2 capsules (0.8 mg total) by mouth daily. 06/15/20   Loletha Grayer, MD  thiamine 100 MG tablet Take 1 tablet (100 mg total) by mouth daily. 06/16/20   Loletha Grayer, MD  vitamin B-12 1000 MCG tablet Take 1 tablet (1,000 mcg total) by mouth daily. 06/16/20   Loletha Grayer, MD    Allergies Patient has no known allergies.  Family History  Problem Relation Age of Onset  . Diabetes Sister      Social History Social History   Tobacco Use  . Smoking status: Current Every Day Smoker    Packs/day: 0.25    Years: 10.00    Pack years: 2.50  . Smokeless tobacco: Never Used  Vaping Use  . Vaping Use: Never used  Substance Use Topics  . Alcohol use: Not Currently    Alcohol/week: 10.0 standard drinks    Types: 10 Shots of liquor per week  . Drug use: No    Review of Systems  Constitutional: No fever/chills Eyes: No visual changes. ENT: No sore throat. Cardiovascular: Denies chest pain. Respiratory: Denies shortness of breath. Gastrointestinal: No abdominal pain.  No nausea, no vomiting.  No diarrhea.  No constipation. Genitourinary: Negative for dysuria. Musculoskeletal: Negative for back pain.  Positive for neck pain. Skin: Negative for rash. Neurological: Negative for headaches, positive for weakness.  ____________________________________________   PHYSICAL EXAM:  VITAL SIGNS: ED Triage Vitals  Enc Vitals Group  BP 10/14/20 1817 (!) 180/92     Pulse Rate 10/14/20 1817 71     Resp 10/14/20 1817 20     Temp --      Temp src --      SpO2 10/14/20 1817 98 %     Weight 10/14/20 1828 169 lb 12.1 oz (77 kg)     Height 10/14/20 1828 5\' 9"  (1.753 m)     Head Circumference --      Peak Flow --      Pain Score 10/14/20 1828 0     Pain Loc --      Pain Edu? --      Excl. in [place]? --     Constitutional: Alert and oriented to person and place, but not time or situation. Eyes: Conjunctivae are normal.  Pupils equal round and reactive to light bilaterally. Head: Atraumatic. Nose: No congestion/rhinnorhea. Mouth/Throat: Mucous membranes are moist. Neck: Normal ROM, midline cervical spine tenderness to palpation noted. Cardiovascular: Normal rate, regular rhythm. Grossly normal heart sounds.  2+ radial and DP pulses bilaterally. Respiratory: Normal respiratory effort.  No retractions. Lungs CTAB. Gastrointestinal: Soft and nontender. No  distention. Genitourinary: deferred Musculoskeletal: No lower extremity tenderness nor edema. Neurologic:  Normal speech and language.  1 out of 5 strength in left lower extremity, 5 out of 5 strength in right lower extremity.  4 out of 5 strength in left upper extremity, 5 out of 5 strength in right upper extremity. Skin:  Skin is warm, dry and intact. No rash noted. Psychiatric: Mood and affect are normal. Speech and behavior are normal.  ____________________________________________   LABS (all labs ordered are listed, but only abnormal results are displayed)  Labs Reviewed  CBC WITH DIFFERENTIAL/PLATELET - Abnormal; Notable for the following components:      Result Value   WBC 11.1 (*)    Neutro Abs 8.1 (*)    All other components within normal limits  COMPREHENSIVE METABOLIC PANEL - Abnormal; Notable for the following components:   Glucose, Bld 167 (*)    Albumin 3.4 (*)    AST 14 (*)    All other components within normal limits  URINALYSIS, COMPLETE (UACMP) WITH MICROSCOPIC - Abnormal; Notable for the following components:   Color, Urine YELLOW (*)    APPearance CLEAR (*)    Glucose, UA >=500 (*)    Hgb urine dipstick MODERATE (*)    Ketones, ur 5 (*)    Protein, ur 100 (*)    Bacteria, UA RARE (*)    All other components within normal limits  URINE DRUG SCREEN, QUALITATIVE ([facility] ONLY) - Abnormal; Notable for the following components:   Cocaine Metabolite,Ur Magnet POSITIVE (*)    All other components within normal limits  CBG MONITORING, ED - Abnormal; Notable for the following components:   Glucose-Capillary 183 (*)    All other components within normal limits  RESP PANEL BY RT-PCR (FLU A&B, COVID) ARPGX2  ETHANOL  PROTIME-INR  TROPONIN I (HIGH SENSITIVITY)  TROPONIN I (HIGH SENSITIVITY)   ____________________________________________  EKG  ED ECG REPORT I, Blake Divine, the attending physician, personally viewed and interpreted this ECG.   Date: 10/14/2020   EKG Time: 18:47  Rate: 67  Rhythm: normal sinus rhythm  Axis: Normal  Intervals:none  ST&T Change: None   PROCEDURES  Procedure(s) performed (including Critical Care):  .Critical Care Performed by: Blake Divine, MD Authorized by: Blake Divine, MD   Critical care provider statement:    Critical care  time (minutes):  45   Critical care time was exclusive of:  Separately billable procedures and treating other patients and teaching time   Critical care was necessary to treat or prevent imminent or life-threatening deterioration of the following conditions:  CNS failure or compromise   Critical care was time spent personally by me on the following activities:  Discussions with consultants, evaluation of patient's response to treatment, examination of patient, ordering and performing treatments and interventions, ordering and review of laboratory studies, ordering and review of radiographic studies, pulse oximetry, re-evaluation of patient's condition, obtaining history from patient or surrogate and review of old charts   I assumed direction of critical care for this patient from another provider in my specialty: no     Care discussed with: accepting provider at another facility       ____________________________________________   INITIAL IMPRESSION / ASSESSMENT AND PLAN / ED COURSE       75 year old male with past medical history of diabetes, peripheral arterial disease, schizophrenia, and polysubstance abuse who presents to the ED for neck pain and worsening weakness over the past 2 weeks.  Symptoms have progressed to the point that patient states he was unable to get out of bed or walk this morning.  He does have significant weakness in his left lower extremity and to a lesser extent in his left upper extremity.  While he complains of neck pain, unilateral weakness is more suggestive of cerebral process than a spinal process.  We will further assess with CT head and C-spine,  check labs, UA, and chest x-ray.  While I am concerned for stroke, patient is outside the window for acute intervention given symptoms have been going on for almost 2 weeks.  CT scan is significant for right-sided intraparenchymal hemorrhage with mild mass-effect but no midline shift.  Case discussed with Dr. Cari Caraway of neurosurgery, who states that this type of injury is to be handled by neurology.  Case discussed with Dr. Curly Shores of neurology, who agrees with plan for blood pressure control and recommends transfer to [facility] for management in the neuro ICU.  Case discussed with Dr. Rory Percy at [facility], who accepts patient for transfer, recommends holding off on seizure prophylaxis and managing BP with Cleviprex drip.  Goal BP will be systolic of less than 644.  Unfortunately, there are no ICU beds currently available at [facility], case discussed with Dr. Vanita Panda in the ED who accepts patient for ED to ED transfer.  Patient remains awake and alert, agrees with plan for transfer to [facility], no change in his neurologic exam from previous.  Patient in stable condition at time of transfer via [facility].      ____________________________________________   FINAL CLINICAL IMPRESSION(S) / ED DIAGNOSES  Final diagnoses:  Cerebral hemorrhage [facility])     ED Discharge Orders    None       Note:  This document was prepared using Dragon voice recognition software and may include unintentional dictation errors.   Blake Divine, MD 10/14/20 0347    Blake Divine, MD 10/14/20 [phone]

## 2020-10-14 NOTE — ED Notes (Signed)
[facility] to transport to [facility] ED to ED.

## 2020-10-14 NOTE — ED Notes (Signed)
Phlebotomy requested for blood draw  

## 2020-10-14 NOTE — ED Notes (Signed)
Phlebotomy at bedside.

## 2020-10-14 NOTE — ED Triage Notes (Addendum)
Pt arrived via [facility] from [place] for intraparenchymal hemorrhage. Arrived on 8mg  Clevaprex. Immediatly seen by MD Rory Percy, Clevaprex increased to 16mg  for arrival systolic of 546. Pt alert and communicates clearly, but unable to provide correct month, year, or age. Mild weakness noted on pt left arm, significant weakness in left leg. Pt reports left frontal headache that began on Monday and reports 10/10 pain.

## 2020-10-14 NOTE — H&P (Signed)
Neurology [place] H&P  CC: Weakness  History is obtained from: Patient, chart  HPI: Lance [name] is a 75 y.o. male past medical history of hepatitis C, diabetes, schizophrenia presented to ER for 2 weeks of weakness and difficulty walking. He reports the weakness is generalized but more so in his left leg and having difficulty walking. He was seen in December 2021 after he was being found down and was noted to have rapid shifts in his mentations. He was positive for cocaine at that time and also admits to doing cocaine 2 days ago this time. He was extremely hypertensive at [facility] emergency room. CT head in the emergency room at [facility] was done that showed aRight frontal intraparenchymal hemorrhage.  Started on Cleviprex drip and transferred ED to ED for ICU admission at [facility]. Prior MRIs were suggestive of chronic microangiopathy and mild hemorrhages concerning for amyloid angiopathy.  Also had a right frontal meningioma, stable on today's exam. He continues to abuse cocaine. Denies any headache at this time Reports incoordination, weakness and difficulty walking as his main concerns at this time.  LKW: 2 weeks ago tpa given?: no, ICH Premorbid modified Rankin scale (mRS): 0 ICH score-0   ROS: Full ROS was performed and is negative except as noted in the HPI.  Past Medical History:  Diagnosis Date  . Diabetes mellitus without complication ([place])   . Hepatitis C   . Schizophrenia ([place])     Family History  Problem Relation Age of Onset  . Diabetes Sister     Social History:   reports that he has been smoking. He has a 2.50 pack-year smoking history. He has never used smokeless tobacco. He reports previous alcohol use of about 10.0 standard drinks of alcohol per week. He reports that he does not use drugs.  Medications  Current Facility-Administered Medications:  .   stroke: mapping our early stages of recovery book, , Does not  apply, Once, Amie Portland, MD .  acetaminophen (TYLENOL) tablet 650 mg, 650 mg, Oral, Q4H PRN **OR** acetaminophen (TYLENOL) 160 MG/5ML solution 650 mg, 650 mg, Per Tube, Q4H PRN **OR** acetaminophen (TYLENOL) suppository 650 mg, 650 mg, Rectal, Q4H PRN, Amie Portland, MD .  labetalol (NORMODYNE) injection 20 mg, 20 mg, Intravenous, Once **AND** clevidipine (CLEVIPREX) infusion 0.5 mg/mL, 0-21 mg/hr, Intravenous, Continuous, Amie Portland, MD .  pantoprazole (PROTONIX) injection 40 mg, 40 mg, Intravenous, QHS, Amie Portland, MD .  senna-docusate (Senokot-S) tablet 1 tablet, 1 tablet, Oral, BID, Amie Portland, MD  Current Outpatient Medications:  .  acetaminophen (TYLENOL) 650 MG CR tablet, Take by mouth., Disp: , Rfl:  .  albuterol (VENTOLIN HFA) 108 (90 Base) MCG/ACT inhaler, Inhale 2 puffs into the lungs every 6 (six) hours as needed for wheezing or shortness of breath., Disp: 18 g, Rfl: 0 .  atorvastatin (LIPITOR) 20 MG tablet, Take 20 mg by mouth daily., Disp: , Rfl:  .  citalopram (CELEXA) 10 MG tablet, Take 1 tablet (10 mg total) by mouth daily., Disp: 30 tablet, Rfl: 0 .  diclofenac Sodium (VOLTAREN) 1 % GEL, Apply topically., Disp: , Rfl:  .  feeding supplement (ENSURE ENLIVE / ENSURE PLUS) LIQD, Take 237 mLs by mouth 3 (three) times daily between meals., Disp: 21330 mL, Rfl: 0 .  ferrous sulfate 325 (65 FE) MG tablet, Take 1 tablet (325 mg total) by mouth daily with breakfast., Disp: 30 tablet, Rfl: 0 .  insulin aspart (NOVOLOG) 100 UNIT/ML FlexPen, 4 units  subcutaneous injection with meals (okay to substitute any short acting insulin pen that is covered), Disp: 15 mL, Rfl: 0 .  insulin detemir (LEVEMIR) 100 UNIT/ML FlexPen, Inject 18 Units into the skin at bedtime., Disp: 15 mL, Rfl: 1 .  Insulin Pen Needle 34G X 3.5 MM MISC, 1 Dose by Does not apply route 4 (four) times daily -  before meals and at bedtime., Disp: 200 each, Rfl: 0 .  Multiple Vitamin (MULTI-VITAMIN) tablet, Take 1 tablet  by mouth daily., Disp: , Rfl:  .  OLANZapine (ZYPREXA) 5 MG tablet, Take by mouth., Disp: , Rfl:  .  polyethylene glycol (MIRALAX / GLYCOLAX) 17 g packet, Take 17 g by mouth daily as needed for mild constipation., Disp: 30 each, Rfl: 0 .  polyvinyl alcohol (ARTIFICIAL TEARS) 1.4 % ophthalmic solution, Place 1 drop into both eyes as needed for dry eyes., Disp: 15 mL, Rfl: 0 .  sucralfate (CARAFATE) 1 g tablet, Take by mouth., Disp: , Rfl:  .  tamsulosin (FLOMAX) 0.4 MG CAPS capsule, Take 2 capsules (0.8 mg total) by mouth daily., Disp: 60 capsule, Rfl: 0 .  thiamine 100 MG tablet, Take 1 tablet (100 mg total) by mouth daily., Disp: 30 tablet, Rfl: 0 .  vitamin B-12 1000 MCG tablet, Take 1 tablet (1,000 mcg total) by mouth daily., Disp: 30 tablet, Rfl: 0   Exam: Current vital signs: BP (!) 145/77   Pulse 79   Temp 98.7 F (37.1 C) (Oral)   Resp (!) 22   Ht 5\' 9"  (1.753 m)   Wt 77 kg   SpO2 98%   BMI 25.07 kg/m  Vital signs in last 24 hours: Temp:  [98.2 F (36.8 C)-98.3 F (36.8 C)] 98.2 F (36.8 C) (05/07 2048) Pulse Rate:  [67-87] 78 (05/07 2048) Resp:  [7-24] 22 (05/07 2048) BP: (158-198)/(82-117) 161/82 (05/07 2048) SpO2:  [97 %-99 %] 97 % (05/07 2048) Weight:  [77 kg] 77 kg (05/07 1828) General: Awake alert in no distress HEENT: Normocephalic atraumatic Chest:Clear Cardiovascular: Regular rhythm Abdomen nondistended nontender Extremities warm well perfused Neurological examination  awake alert oriented to self Not oriented to place or time Got his age wrong, got the month wrong. Not dysarthric No evidence of aphasia but relatively poor attention concentration Cranial nerves: Pupils equal round react light, extraocular movements intact, visual fields full, face appears symmetric, facial sensation intact, tongue and palate midline Motor exam: Left lower extremity is barely 1/5.  Left upper extremity is 3/5.  Right side upper and lower extremity-full strength  5/5. Sensation: Intact all over without extinction Coordination: No dysmetria on the right, difficult to perform on the left. Gait testing deferred at this time NIH stroke scale 1a Level of Conscious.: 0 1b LOC Questions: 2 1c LOC Commands: 0 2 Best Gaze: 0 3 Visual: 0 4 Facial Palsy: 0 5a Motor Arm - left: 1 5b Motor Arm - Right: 0 6a Motor Leg - Left: 3 6b Motor Leg - Right: 0 7 Limb Ataxia: 0 8 Sensory: 0 9 Best Language: 0 10 Dysarthria: 0 11 Extinct. and Inatten.: 0 TOTAL: 6  Labs I have reviewed labs in epic and the results pertinent to this consultation are:  CBC    Component Value Date/Time   WBC 11.1 (H) 10/14/2020 1922   RBC 4.35 10/14/2020 1922   HGB 13.2 10/14/2020 1922   HGB 9.4 (L) 03/02/2019 1630   HCT 39.4 10/14/2020 1922   HCT 28.2 (L) 03/02/2019 1630   PLT  170 10/14/2020 1922   PLT 241 03/02/2019 1630   MCV 90.6 10/14/2020 1922   MCV 90 03/02/2019 1630   MCH 30.3 10/14/2020 1922   MCHC 33.5 10/14/2020 1922   RDW 12.7 10/14/2020 1922   RDW 12.9 03/02/2019 1630   LYMPHSABS 2.0 10/14/2020 1922   LYMPHSABS 2.5 03/02/2019 1630   MONOABS 0.7 10/14/2020 1922   EOSABS 0.1 10/14/2020 1922   EOSABS 0.2 03/02/2019 1630   BASOSABS 0.0 10/14/2020 1922   BASOSABS 0.1 03/02/2019 1630    CMP     Component Value Date/Time   NA 137 10/14/2020 1922   NA 139 03/02/2019 1630   K 3.8 10/14/2020 1922   CL 103 10/14/2020 1922   CO2 26 10/14/2020 1922   GLUCOSE 167 (H) 10/14/2020 1922   BUN 17 10/14/2020 1922   BUN 17 03/02/2019 1630   CREATININE 0.75 10/14/2020 1922   CALCIUM 8.9 10/14/2020 1922   PROT 7.4 10/14/2020 1922   PROT 7.9 03/02/2019 1630   ALBUMIN 3.4 (L) 10/14/2020 1922   ALBUMIN 4.4 03/02/2019 1630   AST 14 (L) 10/14/2020 1922   ALT 13 10/14/2020 1922   ALKPHOS 81 10/14/2020 1922   BILITOT 0.6 10/14/2020 1922   BILITOT 0.3 03/02/2019 1630   GFRNONAA >60 10/14/2020 1922   GFRAA >60 10/28/2019 1708   Urine toxicology screen positive for  cocaine metabolites.  Imaging I have reviewed the images obtained:  CT-scan of the brain today done at [facility] IMPRESSION: 1. 3.4 cm x 1.7 cm x 2.8 cm acute right frontoparietal parenchymal bleed, as described above. MRI correlation is recommended. 2. Stable right anterior cranial fossa meningioma.  MRI examination of the brain from December 2021 IMPRESSION: 1. Small or trace bilateral subdural hematomas (up to 3 mm on the right and 2 mm on the left) are likely posttraumatic along with evidence of a small left frontal operculum cerebral contusion. No midline shift. Basilar cisterns remain normal.  2. 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  3. 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 Superimposed chronic white matter signal changes, likely small vessel disease related.  Assessment:  Lance [name] is a 75 year old man with above past medical history presented to the emergency room at [facility] for complaints of weakness and difficulty walking was noted to have nearly no strength in his left leg and significant weakness in the left arm-CT head was done that revealed a right frontal hematoma. He was hypertensive on arrival and also admitted to using cocaine. Likely hypertensive bleed in the setting of cocaine use. Prior MRIs are also suggestive of a right frontal meningioma which is stable in the comparison CT scan and also cerebral amyloid angiopathy- we will repeat MRIs to evaluate that further.  Plan: Subcortical ICH, nontraumatic Cortical ICH, nontraumatic Acuity: Acute Laterality: Right frontal Current suspected etiology: Hypertensive emergency in the setting of cocaine use Treatment: -Admit to neurological ICU -ICH Score: 0 -ICH Volume: Less than 30 -BP control  goal SYS<140 -No indication for neurosurgical consultation at this time -PT/OT/ST  -neuromonitoring  CNS Minimal cytotoxic cerebral edema due to the bleed.  No need for any medical or surgical intervention at this time Compression of brain-as above -MRI brain in the morning-consider CT scan sooner if he has any deterioration in his neurological exam. -Close neuro monitoring  Dysphagia following ICH  -NPO until  cleared by speech -ST  Toxic encephalopathy-likely in the setting of cocaine use -Monitor -Counseled extensively on refraining from cocaine use.   Hemiplegia and hemiparesis following nontraumatic intracerebral hemorrhage affecting left non-dominant side  -Continue PT/OT/ST  RESP Breathing well saturating normally on room air. No active issues Monitor clinically  CV Hypertensive Encephalopathy Essential (primary) hypertension Hypertensive Emergency -Aggressive BP control, goal SBP <140 -Labetalol and hydralazine as needed.  Currently on Cleviprex drip.  Try to down titrate as tolerated. -Transthoracic echo  GI/GU No active issues Gentle hydration  HEME No evidence of anemia Monitor CBC in the morning  ENDO Type 2 diabetes mellitus with hyperglycemia  -SSI -goal HgbA1c < 7  Fluid/Electrolyte Disorders Monitor BMP in the morning Replete electrolytes as necessary  ID Possible Aspiration PNA -CXR -NPO -Monitor   Substance abuse- extensively counseled on refraining from cocaine use given underlying hypertensive microhemorrhages and concern for amyloid angiopathy.  Patient verbalized understanding but at this time of his mentation I am not sure if that would hold.  He needs continuing counseling regarding cocaine cessation.  Prophylaxis DVT: SCD GI: PPI Bowel: Docusate senna  Dispo: Pending  Diet: NPO until cleared by speech/bedside swallow  Code Status: Full Code    THE FOLLOWING WERE PRESENT ON ADMISSION: Intracerebral hemorrhage, cocaine  use, hypertensive emergency, hepatitis C, cerebral edema, possible aspiration pneumonia.  -- Amie Portland, MD Neurologist [facility] Pager: [phone]   CRITICAL CARE ATTESTATION Performed by: Amie Portland, MD Total critical care time:33 minutes Critical care time was exclusive of separately billable procedures and treating other patients and/or supervising APPs/Residents/Students Critical care was necessary to treat or prevent imminent or life-threatening deterioration due to [place], hypertensive emergency This patient is critically ill and at significant risk for neurological worsening and/or death and care requires constant monitoring. Critical care was time spent personally by me on the following activities: development of treatment plan with patient and/or surrogate as well as nursing, discussions with consultants, evaluation of patient's response to treatment, examination of patient, obtaining history from patient or surrogate, ordering and performing treatments and interventions, ordering and review of laboratory studies, ordering and review of radiographic studies, pulse oximetry, re-evaluation of patient's condition, participation in multidisciplinary rounds and medical decision making of high complexity in the care of this patient.

## 2020-10-14 NOTE — ED Notes (Signed)
Dr Jessup at bedside 

## 2020-10-14 NOTE — ED Triage Notes (Signed)
Patient presents to ER from home. Patient reports he has had increased neck pain for 2 weeks. Patient reports when he awoke at noon today he was unable to walk d/t weakness in left leg. Patient unable to move left leg at all on arrival to ER. Mild weakness noted to left arm. Patient A&OX3. Reports hx of neuropathy.

## 2020-10-14 NOTE — ED Provider Notes (Signed)
[facility] EMERGENCY DEPARTMENT Provider Note   CSN: [id number] Arrival date & time: 10/14/20  2135     History Chief Complaint  Patient presents with  . Intraparenchymal hemorrage    Lance [name] is a 75 y.o. male.  HPI 75 year old male with history of diabetes, schizophrenia, and hepatitis C presents emergency department for intraparenchymal hemorrhage.  Was diagnosed at home and had stay on CT.  Went in for of bilateral pain in the back of his head.  States it is currently moderate.  Nothing makes it better, nothing makes it worse.  Denies history of pain like this previously.  Does have pain in his right leg and weakness in his left leg.  States that his left leg has been this week for a long time.  Has same weakness in left upper extremity, he reports this is chronic and unchanged.  Denies visual deficits.  Was hypertensive M&Ms, initially started on Cardene drip, then transition to Cleviprex.    Past Medical History:  Diagnosis Date  . Diabetes mellitus without complication ([place])   . Hepatitis C   . Schizophrenia [facility])     Patient Active Problem List   Diagnosis Date Noted  . ICH (intracerebral hemorrhage) ([place]) 10/14/2020  . Weakness   . Acute metabolic encephalopathy   . Silent micro-hemorrhage of brain ([place])   . Functional assessment declined   . Malnutrition of moderate degree 05/26/2020  . Subacute delirium 05/16/2020  . Syncope 05/15/2020  . Sepsis ([place]) 05/14/2020  . Leg pain 12/07/2018  . PAD (peripheral artery disease) ([place]) 12/07/2018  . Acute upper GI bleed 10/10/2016  . Hypotension   . Polysubstance abuse ([place]) 03/10/2015  . Mass of arm 03/10/2015  . 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 03/10/2015  . Perianal abscess 08/31/2014  . 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 ([place]) 07/07/2014  . Adenomatous polyp of colon 08/30/2013  . Type 2 diabetes mellitus with hyperglycemia, without long-term current use of insulin ([place]) 08/26/2013  . Hepatitis C virus  infection without hepatic coma 03/21/2013  . Need for hepatitis B vaccination 03/21/2013  . Depression 10/23/2012  . IVDU (intravenous drug user) 10/23/2012  . Schizophrenia ([place]) 10/23/2012  . Carpal tunnel syndrome on both sides 07/31/2012  . Diabetic peripheral neuropathy ([place]) 07/31/2012  . Osteoarthritis of both hips 07/31/2012  . Esophageal reflux 10/21/2009  . Diverticulitis of colon 10/21/2009    Past Surgical History:  Procedure Laterality Date  . COLONOSCOPY WITH PROPOFOL N/A 08/15/2020   Procedure: COLONOSCOPY WITH PROPOFOL;  Surgeon: Lucilla Lame, MD;  Location: [facility] ENDOSCOPY;  Service: Endoscopy;  Laterality: N/A;  . ESOPHAGOGASTRODUODENOSCOPY (EGD) WITH PROPOFOL N/A 10/10/2016   Procedure: ESOPHAGOGASTRODUODENOSCOPY (EGD) WITH PROPOFOL;  Surgeon: Lucilla Lame, MD;  Location: [facility] ENDOSCOPY;  Service: Endoscopy;  Laterality: N/A;  . HERNIA REPAIR    . VISCERAL ARTERY INTERVENTION N/A 10/11/2016   Procedure: Visceral Artery Intervention;  Surgeon: Algernon Huxley, MD;  Location: [facility] CV LAB;  Service: Cardiovascular;  Laterality: N/A;       Family History  Problem Relation Age of Onset  . Diabetes Sister     Social History   Tobacco Use  . Smoking status: Current Every Day Smoker    Packs/day: 0.25    Years: 10.00    Pack years: 2.50  . Smokeless tobacco: Never Used  Vaping Use  . Vaping Use: Never used  Substance Use Topics  . Alcohol use: Not Currently    Alcohol/week: 10.0 standard drinks  Types: 10 Shots of liquor per week  . Drug use: No    Home Medications Prior to Admission medications   Medication Sig Start Date End Date Taking? Authorizing Provider  acetaminophen (TYLENOL) 650 MG CR tablet Take by mouth.    [provider]  albuterol (VENTOLIN HFA) 108 (90 Base) MCG/ACT inhaler Inhale 2 puffs into the lungs every 6 (six) hours as needed for wheezing or shortness of breath. 06/15/20   Loletha Grayer, MD  atorvastatin (LIPITOR) 20 MG  tablet Take 20 mg by mouth daily. 07/19/20   [provider]  citalopram (CELEXA) 10 MG tablet Take 1 tablet (10 mg total) by mouth daily. 06/15/20   Loletha Grayer, MD  diclofenac Sodium (VOLTAREN) 1 % GEL Apply topically. 03/08/15   [provider]  feeding supplement (ENSURE ENLIVE / ENSURE PLUS) LIQD Take 237 mLs by mouth 3 (three) times daily between meals. 06/15/20   Loletha Grayer, MD  ferrous sulfate 325 (65 FE) MG tablet Take 1 tablet (325 mg total) by mouth daily with breakfast. 06/15/20   Loletha Grayer, MD  insulin aspart (NOVOLOG) 100 UNIT/ML FlexPen 4 units subcutaneous injection with meals (okay to substitute any short acting insulin pen that is covered) 06/15/20   Loletha Grayer, MD  insulin detemir (LEVEMIR) 100 UNIT/ML FlexPen Inject 18 Units into the skin at bedtime. 06/15/20   Loletha Grayer, MD  Insulin Pen Needle 34G X 3.5 MM MISC 1 Dose by Does not apply route 4 (four) times daily -  before meals and at bedtime. 06/15/20   Loletha Grayer, MD  Multiple Vitamin (MULTI-VITAMIN) tablet Take 1 tablet by mouth daily.    [provider]  OLANZapine (ZYPREXA) 5 MG tablet Take by mouth.    [provider]  polyethylene glycol (MIRALAX / GLYCOLAX) 17 g packet Take 17 g by mouth daily as needed for mild constipation. 06/15/20   Loletha Grayer, MD  polyvinyl alcohol (ARTIFICIAL TEARS) 1.4 % ophthalmic solution Place 1 drop into both eyes as needed for dry eyes. 06/15/20   Loletha Grayer, MD  sucralfate (CARAFATE) 1 g tablet Take by mouth.    [provider]  tamsulosin (FLOMAX) 0.4 MG CAPS capsule Take 2 capsules (0.8 mg total) by mouth daily. 06/15/20   Loletha Grayer, MD  thiamine 100 MG tablet Take 1 tablet (100 mg total) by mouth daily. 06/16/20   Loletha Grayer, MD  vitamin B-12 1000 MCG tablet Take 1 tablet (1,000 mcg total) by mouth daily. 06/16/20   Loletha Grayer, MD    Allergies    Patient has no known allergies.  Review of Systems    Review of Systems  Constitutional: Negative for chills and fever.  HENT: Negative for ear pain and sore throat.   Eyes: Negative for pain and visual disturbance.  Respiratory: Negative for cough and shortness of breath.   Cardiovascular: Negative for chest pain and palpitations.  Gastrointestinal: Negative for abdominal pain and vomiting.  Genitourinary: Negative for dysuria and hematuria.  Musculoskeletal: Positive for myalgias and neck pain. Negative for arthralgias and back pain.  Skin: Negative for color change and rash.  Neurological: Positive for weakness and headaches. Negative for seizures and syncope.  All other systems reviewed and are negative.   Physical Exam Updated Vital Signs BP (!) 143/83 (BP Location: Right Arm)   Pulse 88   Temp 98.7 F (37.1 C) (Oral)   Resp 17   Ht 5\' 9"  (1.753 m)   Wt 77 kg   SpO2  99%   BMI 25.07 kg/m   Physical Exam Vitals and nursing note reviewed.  Constitutional:      Appearance: He is well-developed.  HENT:     Head: Normocephalic and atraumatic.  Eyes:     Extraocular Movements: Extraocular movements intact.     Conjunctiva/sclera: Conjunctivae normal.     Pupils: Pupils are equal, round, and reactive to light.  Cardiovascular:     Rate and Rhythm: Normal rate and regular rhythm.     Heart sounds: No murmur heard.   Pulmonary:     Effort: Pulmonary effort is normal. No respiratory distress.     Breath sounds: Normal breath sounds.  Abdominal:     Palpations: Abdomen is soft.     Tenderness: There is no abdominal tenderness.  Musculoskeletal:     Cervical back: Neck supple.  Skin:    General: Skin is warm and dry.  Neurological:     Mental Status: He is alert and oriented to person, place, and time.     Cranial Nerves: No cranial nerve deficit.     Sensory: No sensory deficit.     Comments: Right upper extremity and right lower extremity 5/5 strength.  Left upper extremity 4/5, with Shrug of 3/5.  Left lower  extremity 2/5.  Psychiatric:        Mood and Affect: Mood normal.        Behavior: Behavior normal.     ED Results / Procedures / Treatments   Labs (all labs ordered are listed, but only abnormal results are displayed) Labs Reviewed  HEMOGLOBIN A1C  LIPID PANEL  CBC  COMPREHENSIVE METABOLIC PANEL    EKG None  Radiology DG Chest 2 View  Result Date: 10/14/2020 CLINICAL DATA:  Neck pain and left leg weakness. EXAM: CHEST - 2 VIEW COMPARISON:  May 14, 2020 FINDINGS: Chronic appearing increased interstitial lung markings are seen without evidence of acute infiltrate, pleural effusion or pneumothorax. The heart size and mediastinal contours are within normal limits. Degenerative changes seen involving the right shoulder and thoracic spine. Ill-defined suture like material is seen overlying the left upper quadrant. IMPRESSION: Stable exam without active cardiopulmonary disease. Electronically Signed   By: Virgina Norfolk M.D.   On: 10/14/2020 19:27   CT Head Wo Contrast  Result Date: 10/14/2020 CLINICAL DATA:  Neck pain and left leg weakness. EXAM: CT HEAD WITHOUT CONTRAST TECHNIQUE: Contiguous axial images were obtained from the base of the skull through the vertex without intravenous contrast. COMPARISON:  March 22, 2020 FINDINGS: Brain: There is mild cerebral atrophy with widening of the extra-axial spaces and ventricular dilatation. There are areas of decreased attenuation within the white matter tracts of the supratentorial brain, consistent with microvascular disease changes. A 3.4 cm x 1.7 cm x 2.8 cm lobulated acute parenchymal bleed is seen within the parasagittal portion of the frontoparietal region on the right. Very mild mass effect is seen on the adjacent sulci with no evidence of midline shift. A stable meningioma is seen along the anterior aspect of the anterior cranial fossa with a mild amount of stable mass-effect noted on the right frontal lobe. Vascular: No hyperdense  vessel or unexpected calcification. Skull: Normal. Negative for fracture or focal lesion. Sinuses/Orbits: No acute finding. Other: Persistent moderate to marked severity mildly dense subcutaneous inflammatory fat stranding is seen along the posterior fossa on the right. IMPRESSION: 1. 3.4 cm x 1.7 cm x 2.8 cm acute right frontoparietal parenchymal bleed, as described above. MRI correlation is  recommended. 2. Stable right anterior cranial fossa meningioma. Electronically Signed   By: Virgina Norfolk M.D.   On: 10/14/2020 19:37   CT Cervical Spine Wo Contrast  Result Date: 10/14/2020 CLINICAL DATA:  Neck pain and left leg weakness. EXAM: CT CERVICAL SPINE WITHOUT CONTRAST TECHNIQUE: Multidetector CT imaging of the cervical spine was performed without intravenous contrast. Multiplanar CT image reconstructions were also generated. COMPARISON:  None. FINDINGS: Alignment: Normal. Skull base and vertebrae: No acute fracture. Chronic changes are seen involving the body and tip of the dens as well as the anterior arch of C1. Soft tissues and spinal canal: No prevertebral fluid or swelling. No visible canal hematoma. Disc levels: Marked severity endplate sclerosis is seen at the levels of C3-C4, C4-C5 and C5-C6. Moderate to marked severity intervertebral disc space narrowing is seen at the level of C5-C6. Marked severity bilateral multilevel facet joint hypertrophy is noted. Upper chest: Negative. Other: None. IMPRESSION: 1. No evidence of acute cervical spine fracture or subluxation. 2. Marked severity multilevel degenerative changes. Electronically Signed   By: Virgina Norfolk M.D.   On: 10/14/2020 19:41    Procedures Procedures   Medications Ordered in ED Medications   stroke: mapping our early stages of recovery book (has no administration in time range)  acetaminophen (TYLENOL) tablet 650 mg (has no administration in time range)    Or  acetaminophen (TYLENOL) 160 MG/5ML solution 650 mg (has no  administration in time range)    Or  acetaminophen (TYLENOL) suppository 650 mg (has no administration in time range)  senna-docusate (Senokot-S) tablet 1 tablet (has no administration in time range)  pantoprazole (PROTONIX) injection 40 mg (has no administration in time range)  labetalol (NORMODYNE) injection 20 mg (has no administration in time range)    And  clevidipine (CLEVIPREX) infusion 0.5 mg/mL (has no administration in time range)    ED Course  I have reviewed the triage vital signs and the nursing notes.  Pertinent labs & imaging results that were available during my care of the patient were reviewed by me and considered in my medical decision making (see chart for details).    MDM Rules/Calculators/A&P                          75 year old male arrived as ED to ED transfer for intraparenchymal hemorrhage.  He arrives hemodynamically stable.  Not in acute distress.  Neurology at bedside upon arrival.  Will admit to ICU for further monitoring and treatment of IPH.  Patient is currently on Cleviprex drip with adequate blood pressure.  No acute findings on my exam.  No change in plan from neurology plan.  Patient admitted to ICU.  Final Clinical Impression(s) / ED Diagnoses Final diagnoses:  None    Rx / DC Orders ED Discharge Orders    None       Suzan Nailer, DO 10/14/20 2152    Carmin Muskrat, MD 10/14/20 2324

## 2020-10-15 ENCOUNTER — Inpatient Hospital Stay (HOSPITAL_COMMUNITY): Payer: Medicare HMO

## 2020-10-15 DIAGNOSIS — I6389 Other cerebral infarction: Secondary | ICD-10-CM | POA: Diagnosis not present

## 2020-10-15 LAB — COMPREHENSIVE METABOLIC PANEL
ALT: 13 U/L (ref 0–44)
AST: 14 U/L — ABNORMAL LOW (ref 15–41)
Albumin: 3.1 g/dL — ABNORMAL LOW (ref 3.5–5.0)
Alkaline Phosphatase: 93 U/L (ref 38–126)
Anion gap: 7 (ref 5–15)
BUN: 13 mg/dL (ref 8–23)
CO2: 27 mmol/L (ref 22–32)
Calcium: 8.9 mg/dL (ref 8.9–10.3)
Chloride: 101 mmol/L (ref 98–111)
Creatinine, Ser: 0.86 mg/dL (ref 0.61–1.24)
GFR, Estimated: >60 mL/min (ref >60)
Glucose, Bld: 342 mg/dL — ABNORMAL HIGH (ref 70–99)
Potassium: 3.6 mmol/L (ref 3.5–5.1)
Sodium: 135 mmol/L (ref 135–145)
Total Bilirubin: 0.5 mg/dL (ref 0.3–1.2)
Total Protein: 7.3 g/dL (ref 6.5–8.1)

## 2020-10-15 LAB — GLUCOSE, CAPILLARY
Glucose-Capillary: 295 mg/dL — ABNORMAL HIGH (ref 70–99)
Glucose-Capillary: 480 mg/dL — ABNORMAL HIGH (ref 70–99)
Glucose-Capillary: 422 mg/dL — ABNORMAL HIGH (ref 70–99)
Glucose-Capillary: 254 mg/dL — ABNORMAL HIGH (ref 70–99)
Glucose-Capillary: 142 mg/dL — ABNORMAL HIGH (ref 70–99)
Glucose-Capillary: 109 mg/dL — ABNORMAL HIGH (ref 70–99)
Glucose-Capillary: 81 mg/dL (ref 70–99)
Glucose-Capillary: 216 mg/dL — ABNORMAL HIGH (ref 70–99)
Glucose-Capillary: 186 mg/dL — ABNORMAL HIGH (ref 70–99)

## 2020-10-15 LAB — MRSA PCR SCREENING: MRSA by PCR: POSITIVE — AB

## 2020-10-15 LAB — LIPID PANEL
Cholesterol: 159 mg/dL (ref 0–200)
HDL: 26 mg/dL — ABNORMAL LOW (ref >40)
LDL Cholesterol: 59 mg/dL (ref 0–99)
Total CHOL/HDL Ratio: 6.1 RATIO
Triglycerides: 369 mg/dL — ABNORMAL HIGH (ref <150)
VLDL: 74 mg/dL — ABNORMAL HIGH (ref 0–40)

## 2020-10-15 LAB — ECHOCARDIOGRAM COMPLETE
Area-P 1/2: 2.56 cm2
Height: 69 in
S' Lateral: 2.8 cm
Weight: 2716.07 oz

## 2020-10-15 LAB — CBC
HCT: 43.5 % (ref 39.0–52.0)
Hemoglobin: 14.5 g/dL (ref 13.0–17.0)
MCH: 30.5 pg (ref 26.0–34.0)
MCHC: 33.3 g/dL (ref 30.0–36.0)
MCV: 91.4 fL (ref 80.0–100.0)
Platelets: 250 10*3/uL (ref 150–400)
RBC: 4.76 MIL/uL (ref 4.22–5.81)
RDW: 12.6 % (ref 11.5–15.5)
WBC: 13 10*3/uL — ABNORMAL HIGH (ref 4.0–10.5)
nRBC: 0 % (ref 0.0–0.2)

## 2020-10-15 LAB — HEMOGLOBIN A1C
Hgb A1c MFr Bld: 6 % — ABNORMAL HIGH (ref 4.8–5.6)
Hgb A1c MFr Bld: 10.1 % — ABNORMAL HIGH (ref 4.8–5.6)
Mean Plasma Glucose: 125.5 mg/dL
Mean Plasma Glucose: 243.17 mg/dL

## 2020-10-15 MED ORDER — INSULIN ASPART 100 UNIT/ML IJ SOLN
0.0000 [IU] | Freq: Three times a day (TID) | INTRAMUSCULAR | Status: DC
  Administered 2020-10-15: 15 [IU] via SUBCUTANEOUS
  Administered 2020-10-15 – 2020-10-16 (×2): 5 [IU] via SUBCUTANEOUS
  Administered 2020-10-16: 8 [IU] via SUBCUTANEOUS
  Administered 2020-10-16: 2 [IU] via SUBCUTANEOUS
  Administered 2020-10-17: 5 [IU] via SUBCUTANEOUS
  Administered 2020-10-17: 8 [IU] via SUBCUTANEOUS
  Administered 2020-10-17 – 2020-10-18 (×4): 5 [IU] via SUBCUTANEOUS
  Administered 2020-10-19: 3 [IU] via SUBCUTANEOUS
  Administered 2020-10-19: 5 [IU] via SUBCUTANEOUS

## 2020-10-15 MED ORDER — INSULIN REGULAR(HUMAN) IN NACL 100-0.9 UT/100ML-% IV SOLN
INTRAVENOUS | Status: DC
  Administered 2020-10-15: 12 [IU]/h via INTRAVENOUS

## 2020-10-15 MED ORDER — GADOBUTROL 1 MMOL/ML IV SOLN
7.5000 mL | Freq: Once | INTRAVENOUS | Status: AC | PRN
  Administered 2020-10-15: 7.5 mL via INTRAVENOUS

## 2020-10-15 MED ORDER — INSULIN ASPART 100 UNIT/ML IJ SOLN
0.0000 [IU] | Freq: Every day | INTRAMUSCULAR | Status: DC
  Administered 2020-10-16: 4 [IU] via SUBCUTANEOUS
  Administered 2020-10-18: 3 [IU] via SUBCUTANEOUS

## 2020-10-15 MED ORDER — INSULIN ASPART 100 UNIT/ML IJ SOLN: 0.0000 [IU] | Freq: Three times a day (TID) | INTRAMUSCULAR | Status: DC

## 2020-10-15 MED ORDER — DEXTROSE 10 % IV SOLN: INTRAVENOUS | Status: DC

## 2020-10-15 MED ORDER — MUPIROCIN 2 % EX OINT
1.0000 "application " | TOPICAL_OINTMENT | Freq: Two times a day (BID) | CUTANEOUS | Status: DC
  Administered 2020-10-15 – 2020-10-19 (×9): 1 via NASAL
  Filled 2020-10-15: qty 22

## 2020-10-15 MED ORDER — DEXTROSE 50 % IV SOLN: 0.0000 mL | INTRAVENOUS | Status: DC | PRN

## 2020-10-15 MED ORDER — INSULIN DETEMIR 100 UNIT/ML ~~LOC~~ SOLN
18.0000 [IU] | Freq: Every day | SUBCUTANEOUS | Status: DC
  Administered 2020-10-15 – 2020-10-17 (×3): 18 [IU] via SUBCUTANEOUS
  Filled 2020-10-15 (×5): qty 0.18

## 2020-10-15 NOTE — Plan of Care (Signed)
  Problem: Education: Goal: Knowledge of secondary prevention will improve 10/15/2020 0610 by Richardson Chiquito, RN Outcome: Progressing 10/15/2020 0610 by Richardson Chiquito, RN Outcome: Progressing   Problem: Education: Goal: Knowledge of disease or condition will improve Outcome: Progressing

## 2020-10-15 NOTE — Progress Notes (Addendum)
Inpatient Rehab Admissions Coordinator Note:   Per therapy recommendations, pt was screened for CIR candidacy by Emerson Schreifels, MS CCC-SLP. At this time, Pt. Appears to have functional decline and is a potential candidate for CIR. Will request order for rehab consult per protocol.  Please contact me with questions.   Kenna Kirn, MS, CCC-SLP Rehab Admissions Coordinator  [phone] (celll) [phone] (office)   

## 2020-10-15 NOTE — Progress Notes (Signed)
PT Cancellation Note  Patient Details Name: Lance [name] MRN: [id number] DOB: Nov 23, 1945   Cancelled Treatment:    Reason Eval/Treat Not Completed: Active bedrest order   Wyona Almas, PT, DPT Acute Rehabilitation Services Pager [phone] Office [phone]    Deno Etienne 10/15/2020, 7:48 AM

## 2020-10-15 NOTE — Progress Notes (Signed)
**Note Lance [name]** STROKE TEAM PROGRESS NOTE   HISTORY OF PRESENT ILLNESS (per record) DAEVON [name] is a 75 y.o. male past medical history of hepatitis C, diabetes, schizophrenia presented to ER for 2 weeks of weakness and difficulty walking. He reports the weakness is generalized but more so in his left leg and having difficulty walking. He was seen in December 2021 after he was being found down and was noted to have rapid shifts in his mentations. He was positive for cocaine at that time and also admits to doing cocaine 2 days ago this time. He was extremely hypertensive at [facility] emergency room. CT head in the emergency room at [facility] was done that showed a Right frontal intraparenchymal hemorrhage.  Started on Cleviprex drip and transferred ED to ED for ICU admission at [facility]. Prior MRIs were suggestive of chronic microangiopathy and mild hemorrhages concerning for amyloid angiopathy.  Also had a right frontal meningioma, stable on today's exam. He continues to abuse cocaine. Denies any headache at this time Reports incoordination, weakness and difficulty walking as his main concerns at this time. LKW: 2 weeks ago tpa given?: no, ICH Premorbid modified Rankin scale (mRS): 0 ICH score-0   INTERVAL HISTORY He believes that he is unchanged.  He still has weakness on the left side.  Blood sugars have been quite high and over 400 and he has been started on every every hour Accu-Cheks.  His baseline insulin at home has been restarted.  Imaging is reviewed and shows global hemorrhage right posterior frontal lobe.  There are numerous microhemorrhage.  There is also evidence of a right frontal mass consistent with a meningioma.    OBJECTIVE Vitals:   10/15/20 0200 10/15/20 0300 10/15/20 0400 10/15/20 0500  BP: (!) 142/72 (!) 144/78 133/72 126/74  Pulse: 84 96 82 84  Resp: (!) 22 (!) 22 (!) 21 (!) 22  Temp:   99.4 F (37.4 C)   TempSrc:   Oral   SpO2: 92% 94%  96% 94%  Weight:      Height:        CBC:  Recent Labs  Lab 10/14/20 1922 10/15/20 0234  WBC 11.1* 13.0*  NEUTROABS 8.1*  --   HGB 13.2 14.5  HCT 39.4 43.5  MCV 90.6 91.4  PLT 170 063    Basic Metabolic Panel:  Recent Labs  Lab 10/14/20 1922 10/15/20 0234  NA 137 135  K 3.8 3.6  CL 103 101  CO2 26 27  GLUCOSE 167* 342*  BUN 17 13  CREATININE 0.75 0.86  CALCIUM 8.9 8.9    Lipid Panel:     Component Value Date/Time   CHOL 159 10/15/2020 0234   TRIG 369 (H) 10/15/2020 0234   HDL 26 (L) 10/15/2020 0234   CHOLHDL 6.1 10/15/2020 0234   VLDL 74 (H) 10/15/2020 0234   LDLCALC 59 10/15/2020 0234   HgbA1c:  Lab Results  Component Value Date   HGBA1C 10.1 (H) 10/15/2020   Urine Drug Screen:     Component Value Date/Time   LABOPIA NONE DETECTED 10/14/2020 2000   COCAINSCRNUR POSITIVE (A) 10/14/2020 2000   LABBENZ NONE DETECTED 10/14/2020 2000   AMPHETMU NONE DETECTED 10/14/2020 2000   THCU NONE DETECTED 10/14/2020 2000   LABBARB NONE DETECTED 10/14/2020 2000    Alcohol Level     Component Value Date/Time   ETH <10 10/14/2020 1922    IMAGING  CT Head Wo Contrast 10/14/2020 IMPRESSION:  1. 3.4 cm x  1.7 cm x 2.8 cm acute right frontoparietal parenchymal bleed, as described above. MRI correlation is recommended.  2. Stable right anterior cranial fossa meningioma.   MRI BRAIN W & WO CONTRAST -  IMPRESSION: 1. Stable intra-axial hemorrhage in the right superior frontal gyrus/motor strip since yesterday, estimated volume 15 mL. 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 Mild surrounding edema. No significant mass effect or other complicating features.  2. Stable right anterior frontal convexity en plaque Meningioma since December. Anterior segment of the superior sagittal sinus occluded as before but the other dural venous sinuses remain patent.  3. Small bilateral subdural hematomas have resolved since  December.  DG Chest 2 View 10/14/2020 IMPRESSION: Stable exam without active cardiopulmonary disease.   CT Cervical Spine Wo Contrast 10/14/2020 IMPRESSION:  1. No evidence of acute cervical spine fracture or subluxation.  2. Marked severity multilevel degenerative changes.   Transthoracic Echocardiogram  00/00/2021 Pending  ECG - SR rate 67 BPM. (See cardiology reading for complete details)  PHYSICAL EXAM Blood pressure 126/74, pulse 84, temperature 99.4 F (37.4 C), temperature source Oral, resp. rate (!) 22, height 5\' 9"  (1.753 m), weight 77 kg, SpO2 94 %.   GENERAL: He cooperates with evaluation.  HEENT: Normal  ABDOMEN: soft  EXTREMITIES: No edema   BACK: Normal  SKIN: Normal by inspection.    MENTAL STATUS: He is awake and alert.  He does have a moderate dysarthria.     CRANIAL NERVES: Pupils are equal, round and reactive to light and accomodation; extra ocular movements are full, there is no significant nystagmus; visual fields are full; there is flattening of the nasolabial fold - L; tongue is midline  MOTOR: Left upper extremity 2/5 in the left leg 1/5.  Right side 5/5.  COORDINATION: No dysmetria noted on the right side.            ASSESSMENT/PLAN Lance [name] is a 75 y.o. male with history of hepatitis C, diabetes, schizophrenia, tobacco use, ongoing cocaine abuse, possible amyloid angiopathy, and hx of being found down in December 2021 with rapid shifts in his mentation. The pt presented to the ER at [facility] for 2 weeks of generalized weakness, LLE weakness, difficulty walking, and incoordination. He was found to be extremely hypertensive -> treated with cleviprex and tx'd to [facility] ED after a CT Head showed a right frontal intraparenchymal hemorrhage. He did not receive tPA due to [place].  Stroke: right frontal intraparenchymal hemorrhage likely due to severe htn secondary to cocaine use.  Resultant left hemiparesis and  dysarthria  Code Stroke CT Head - not ordered  CT head - 3.4 cm x 1.7 cm x 2.8 cm acute right frontoparietal parenchymal bleed  MRI head W&WO - pending  MRA head - not ordered   CTA H&N - not ordered  CT Perfusion - not ordered  Carotid Doppler - not indicated  2D Echo - pending  Sars Corona Virus 2 - negative  LDL - 59  HgbA1c - 10.1  UDS - positive for cocaine  VTE prophylaxis - SCDs Diet  Diet Order            Diet Heart Room service appropriate? Yes; Fluid consistency: Thin  Diet effective now                 No antithrombotic prior to admission, now on No antithrombotic due to [place]  Ongoing aggressive stroke risk factor management  Therapy recommendations:  pending  Disposition:  Pending  Hypertension  Home BP meds: none  Current BP meds: Cleviprex and normodyne prn  Stable . SBP goal < 140 mm Hg for the first 24 hours then < 160 mm Hg. . Long-term BP goal normotensive  Hyperlipidemia  Home Lipid lowering medication: Lipitor 20 mg daily  LDL 59, goal < 70  Current lipid lowering medication: none (statin contraindicated with ICH)  Continue statin at discharge  Diabetes  Home diabetic meds: insulin  Current diabetic meds: none  HgbA1c 10.1, goal < 7.0 Recent Labs    10/14/20 1830  GLUCAP 183*  He is on a sliding scale but this had to be increased to every 1 hour due to high blood sugars IN 400S.  His baseline insulin has been restarted.  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 This patient's care requires constant monitoring of vital signs, hemodynamics, respiratory and cardiac monitoring, review of multiple databases, neurological assessment, other specialists and medical decision making of high complexity. I spent 35 minutes of neurocritical care time in the care of this patient   To contact Stroke Continuity provider, please refer to [email or web address] After hours, contact General Neurology

## 2020-10-15 NOTE — Evaluation (Signed)
Physical Therapy Evaluation Patient Details Name: Lance [name] MRN: [id number] DOB: 1946/06/05 Today's Date: 10/15/2020   History of Present Illness  Pt is a 75 y.o. M who presents to ER after 2 weeks of weakness and difficulty walking. Pt positive for cocaine. CT head showing right frontal intraparenchymal hemorrhage. MRI 5/8 showing stable intra axial hemorrhage in R superior frontal gyrus and stable right anterior frontal convexity en plaque meningioma. Significant PMH: diabetes mellitus, hepatitis C, schizophrenia.  Clinical Impression  Pt admitted with above. Presents with decreased cognition, poor sitting balance, left hemiplegia, right gaze preference and left inattention. Pt requiring max assist for bed mobility and transfers to standing. BP < 140 throughout. Suspect with +2 assist, pt can progress to pre gait training and transfer to chair next session. Recommend CIR to address deficits and maximize functional mobility.     Follow Up Recommendations CIR    Equipment Recommendations  Wheelchair (measurements PT);Wheelchair cushion (measurements PT);Hospital bed;3in1 (PT)    Recommendations for Other Services       Precautions / Restrictions Precautions Precautions: Fall;Other (comment) Precaution Comments: L hemiplegia, BP < 140 Restrictions Weight Bearing Restrictions: No      Mobility  Bed Mobility Overal bed mobility: Needs Assistance Bed Mobility: Supine to Sit;Sit to Supine     Supine to sit: Max assist Sit to supine: Max assist   General bed mobility comments: Difficulty sequencing, assisting minimally with bringing RLE off edge of bed, assist for LLE and pulling trunk to upright position. Also maxA for return to supine, +2 to scoot up in the bed    Transfers Overall transfer level: Needs assistance Equipment used: None Transfers: Sit to/from Stand Sit to Stand: Max assist         General transfer comment: Face to face transfer with knee block, maxA to  initiate, unable to weight shift to take steps  Ambulation/Gait                Stairs            Wheelchair Mobility    Modified Rankin (Stroke Patients Only) Modified Rankin (Stroke Patients Only) Pre-Morbid Rankin Score: Slight disability Modified Rankin: Severe disability     Balance Overall balance assessment: Needs assistance Sitting-balance support: Feet supported Sitting balance-Leahy Scale: Poor Sitting balance - Comments: Min-maxA for sitting balance, left lateral and posterior lean. Verbal/tactile cues for midline and upward gaze   Standing balance support: Bilateral upper extremity supported Standing balance-Leahy Scale: Poor                               Pertinent Vitals/Pain Pain Assessment: Faces Faces Pain Scale: Hurts even more Pain Location: head, L neck Pain Descriptors / Indicators: Discomfort;Grimacing;Guarding Pain Intervention(s): Limited activity within patient's tolerance;Monitored during session;Other (comment) (RN getting Tylenol)    Home Living Family/patient expects to be discharged to:: Private residence Living Arrangements: Alone Available Help at Discharge: Family;Available PRN/intermittently Type of Home: House Home Access: Stairs to enter Entrance Stairs-Rails: Right Entrance Stairs-Number of Steps: 3 Home Layout: One level Home Equipment: Walker - 4 wheels;Cane - single point      Prior Function Level of Independence: Needs assistance   Gait / Transfers Assistance Needed: Using Rollator in past couple weeks when ambulatory dysfunction occurred  ADL's / Homemaking Assistance Needed: Pt family drives him to grocery shopping, appointment  Comments: Poor historian     Hand Dominance   Dominant Hand: Right  Extremity/Trunk Assessment   Upper Extremity Assessment Upper Extremity Assessment: Defer to OT evaluation;LUE deficits/detail LUE Deficits / Details: Weak hand grip, elbow flexion/extension 2/5,  shoulder flexion 0/5 LUE Sensation: WNL    Lower Extremity Assessment Lower Extremity Assessment: LLE deficits/detail LLE Deficits / Details: No active movement noted LLE Sensation: WNL       Communication   Communication: No difficulties  Cognition Arousal/Alertness: Awake/alert Behavior During Therapy: Flat affect Overall Cognitive Status: Impaired/Different from baseline Area of Impairment: Orientation;Attention;Memory;Following commands;Safety/judgement;Awareness;Problem solving                 Orientation Level: Disoriented to;Place;Time;Situation Current Attention Level: Sustained Memory: Decreased short-term memory Following Commands: Follows one step commands inconsistently Safety/Judgement: Decreased awareness of deficits;Decreased awareness of safety Awareness: Intellectual Problem Solving: Slow processing;Difficulty sequencing;Requires verbal cues;Requires tactile cues General Comments: Pt oriented to self only. Able to correctly state month and year with options. Knows he is in the hospital, but does not know which one. Pt demonstrates STM deficits, cannot recall after re-orientation. Repetition required for 1 step commands.      General Comments      Exercises     Assessment/Plan    PT Assessment Patient needs continued PT services  PT Problem List Decreased strength;Decreased activity tolerance;Decreased mobility;Decreased coordination;Decreased balance;Decreased cognition;Decreased safety awareness       PT Treatment Interventions DME instruction;Gait training;Therapeutic activities;Functional mobility training;Therapeutic exercise;Balance training;Wheelchair mobility training;Patient/family education    PT Goals (Current goals can be found in the Care Plan section)  Acute Rehab PT Goals Patient Stated Goal: to eat PT Goal Formulation: With patient Time For Goal Achievement: 10/29/20 Potential to Achieve Goals: Good    Frequency Min 4X/week    Barriers to discharge        Co-evaluation               AM-PAC PT "6 Clicks" Mobility  Outcome Measure Help needed turning from your back to your side while in a flat bed without using bedrails?: A Lot Help needed moving from lying on your back to sitting on the side of a flat bed without using bedrails?: A Lot Help needed moving to and from a bed to a chair (including a wheelchair)?: A Lot Help needed standing up from a chair using your arms (e.g., wheelchair or bedside chair)?: A Lot Help needed to walk in hospital room?: Total Help needed climbing 3-5 steps with a railing? : Total 6 Click Score: 10    End of Session Equipment Utilized During Treatment: Gait belt Activity Tolerance: Patient tolerated treatment well Patient left: in bed;with call bell/phone within reach;with bed alarm set Nurse Communication: Mobility status PT Visit Diagnosis: Hemiplegia and hemiparesis;Unsteadiness on feet (R26.81);Difficulty in walking, not elsewhere classified (R26.2) Hemiplegia - Right/Left: Left Hemiplegia - dominant/non-dominant: Non-dominant Hemiplegia - caused by: Nontraumatic intracerebral hemorrhage    Time: 1135-1219 PT Time Calculation (min) (ACUTE ONLY): 44 min   Charges:   PT Evaluation $PT Eval Moderate Complexity: 1 Mod PT Treatments $Therapeutic Activity: 23-37 mins        Wyona Almas, PT, DPT Acute Rehabilitation Services Pager [phone] Office [place] 10/15/2020, 1:46 PM

## 2020-10-15 NOTE — Progress Notes (Signed)
  Echocardiogram 2D Echocardiogram has been performed.  Johny Chess 10/15/2020, 3:38 PM

## 2020-10-16 DIAGNOSIS — I611 Nontraumatic intracerebral hemorrhage in hemisphere, cortical: Secondary | ICD-10-CM | POA: Diagnosis not present

## 2020-10-16 LAB — BASIC METABOLIC PANEL
Anion gap: 9 (ref 5–15)
BUN: 15 mg/dL (ref 8–23)
CO2: 24 mmol/L (ref 22–32)
Calcium: 8.6 mg/dL — ABNORMAL LOW (ref 8.9–10.3)
Chloride: 103 mmol/L (ref 98–111)
Creatinine, Ser: 0.83 mg/dL (ref 0.61–1.24)
GFR, Estimated: >60 mL/min (ref >60)
Glucose, Bld: 201 mg/dL — ABNORMAL HIGH (ref 70–99)
Potassium: 3.9 mmol/L (ref 3.5–5.1)
Sodium: 136 mmol/L (ref 135–145)

## 2020-10-16 LAB — CBC
HCT: 43.4 % (ref 39.0–52.0)
Hemoglobin: 14.1 g/dL (ref 13.0–17.0)
MCH: 30.4 pg (ref 26.0–34.0)
MCHC: 32.5 g/dL (ref 30.0–36.0)
MCV: 93.5 fL (ref 80.0–100.0)
Platelets: 223 10*3/uL (ref 150–400)
RBC: 4.64 MIL/uL (ref 4.22–5.81)
RDW: 13 % (ref 11.5–15.5)
WBC: 10.8 10*3/uL — ABNORMAL HIGH (ref 4.0–10.5)
nRBC: 0 % (ref 0.0–0.2)

## 2020-10-16 LAB — GLUCOSE, CAPILLARY
Glucose-Capillary: 222 mg/dL — ABNORMAL HIGH (ref 70–99)
Glucose-Capillary: 272 mg/dL — ABNORMAL HIGH (ref 70–99)
Glucose-Capillary: 135 mg/dL — ABNORMAL HIGH (ref 70–99)
Glucose-Capillary: 347 mg/dL — ABNORMAL HIGH (ref 70–99)

## 2020-10-16 MED ORDER — AMLODIPINE BESYLATE 5 MG PO TABS
5.0000 mg | ORAL_TABLET | Freq: Every day | ORAL | Status: DC
  Administered 2020-10-16 – 2020-10-19 (×4): 5 mg via ORAL
  Filled 2020-10-16 (×4): qty 1

## 2020-10-16 MED ORDER — INSULIN ASPART 100 UNIT/ML IJ SOLN
4.0000 [IU] | Freq: Three times a day (TID) | INTRAMUSCULAR | Status: DC
  Administered 2020-10-17 (×2): 4 [IU] via SUBCUTANEOUS

## 2020-10-16 NOTE — Progress Notes (Signed)
STROKE TEAM PROGRESS NOTE   HISTORY OF PRESENT ILLNESS (per record) Lance [name] is a 75 y.o. male past medical history of hepatitis C, diabetes, schizophrenia presented to ER for 2 weeks of weakness and difficulty walking. He reports the weakness is generalized but more so in his left leg and having difficulty walking. He was seen in December 2021 after he was being found down and was noted to have rapid shifts in his mentations. He was positive for cocaine at that time and also admits to doing cocaine 2 days ago this time. He was extremely hypertensive at [facility] emergency room. CT head in the emergency room at [facility] was done that showed a Right frontal intraparenchymal hemorrhage.  Started on Cleviprex drip and transferred ED to ED for ICU admission at [facility]. Prior MRIs were suggestive of chronic microangiopathy and mild hemorrhages concerning for amyloid angiopathy.  Also had a right frontal meningioma, stable on today's exam. He continues to abuse cocaine. Denies any headache at this time Reports incoordination, weakness and difficulty walking as his main concerns at this time. LKW: 2 weeks ago tpa given?: no, ICH Premorbid modified Rankin scale (mRS): 0 ICH score-0   INTERVAL HISTORY No acute events. He reports he has been told he has high BP in the past but has not been taking medications. He is vague regarding this history. He is agreeable to starting Norvasc. Today he reports no changes or new concerns.    OBJECTIVE Vitals:   10/16/20 0645 10/16/20 0700 10/16/20 0800 10/16/20 0900  BP: 127/84 131/82 118/79 (!) 138/94  Pulse: 76 74 76 72  Resp: 20 13 18 15   Temp:   98.9 F (37.2 C)   TempSrc:   Axillary   SpO2: 96% 98% 96% 98%  Weight:      Height:       CBC:  Recent Labs  Lab 10/14/20 1922 10/15/20 0234 10/16/20 0423  WBC 11.1* 13.0* 10.8*  NEUTROABS 8.1*  --   --   HGB 13.2 14.5 14.1  HCT 39.4 43.5 43.4  MCV 90.6  91.4 93.5  PLT 170 250 409    Basic Metabolic Panel:  Recent Labs  Lab 10/15/20 0234 10/16/20 0423  NA 135 136  K 3.6 3.9  CL 101 103  CO2 27 24  GLUCOSE 342* 201*  BUN 13 15  CREATININE 0.86 0.83  CALCIUM 8.9 8.6*    Lipid Panel:     Component Value Date/Time   CHOL 159 10/15/2020 0234   TRIG 369 (H) 10/15/2020 0234   HDL 26 (L) 10/15/2020 0234   CHOLHDL 6.1 10/15/2020 0234   VLDL 74 (H) 10/15/2020 0234   LDLCALC 59 10/15/2020 0234   HgbA1c:  Lab Results  Component Value Date   HGBA1C 10.1 (H) 10/15/2020   Urine Drug Screen:     Component Value Date/Time   LABOPIA NONE DETECTED 10/14/2020 2000   COCAINSCRNUR POSITIVE (A) 10/14/2020 2000   LABBENZ NONE DETECTED 10/14/2020 2000   AMPHETMU NONE DETECTED 10/14/2020 2000   THCU NONE DETECTED 10/14/2020 2000   LABBARB NONE DETECTED 10/14/2020 2000    Alcohol Level     Component Value Date/Time   ETH <10 10/14/2020 1922    IMAGING  CT Head Wo Contrast 10/14/2020 IMPRESSION:  1. 3.4 cm x 1.7 cm x 2.8 cm acute right frontoparietal parenchymal bleed, as described above. MRI correlation is recommended.  2. Stable right anterior cranial fossa meningioma.   MRI BRAIN  W & WO CONTRAST -  IMPRESSION: 1. Stable intra-axial hemorrhage in the right superior frontal gyrus/motor strip since yesterday, estimated volume 15 mL. 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 Mild surrounding edema. No significant mass effect or other complicating features.  2. Stable right anterior frontal convexity en plaque Meningioma since December. Anterior segment of the superior sagittal sinus occluded as before but the other dural venous sinuses remain patent.  3. Small bilateral subdural hematomas have resolved since December.  DG Chest 2 View 10/14/2020 IMPRESSION: Stable exam without active cardiopulmonary disease.   CT Cervical Spine Wo Contrast 10/14/2020 IMPRESSION:  1. No  evidence of acute cervical spine fracture or subluxation.  2. Marked severity multilevel degenerative changes.   Transthoracic Echocardiogram  10/14/2020 Left Ventricle: Left ventricular ejection fraction, by estimation, is 70  to 75%. The left ventricle has hyperdynamic function. The left ventricle has no regional wall motion abnormalities. The left ventricular internal cavity size was normal in size.  There is mild concentric left ventricular hypertrophy. Left ventricular diastolic parameters are consistent with Grade I diastolic dysfunction  (impaired relaxation).  Right Ventricle: The right ventricular size is normal. No increase in  right ventricular wall thickness. Right ventricular systolic function is  normal.  Left Atrium: Left atrial size was normal in size.  Right Atrium: Right atrial size was normal in size. Prominent Eustachian valve.  Pericardium: There is no evidence of pericardial effusion.  Mitral Valve: The mitral valve is normal in structure. No evidence of  mitral valve regurgitation. No evidence of mitral valve stenosis.  Tricuspid Valve: The tricuspid valve is normal in structure. Tricuspid valve regurgitation is not demonstrated. No evidence of tricuspid stenosis.   Aortic Valve: The aortic valve is normal in structure. Aortic valve  regurgitation is not visualized. No aortic stenosis is present.   Pulmonic Valve: The pulmonic valve was normal in structure. Pulmonic valve regurgitation is not visualized. No evidence of pulmonic stenosis.   Aorta: The aortic root is normal in size and structure.   Venous: The inferior vena cava is normal in size with greater than 50% respiratory variability, suggesting right atrial pressure of 3 mmHg.   IAS/Shunts: No atrial level shunt detected by color flow Doppler.   PHYSICAL EXAM Blood pressure (!) 138/94, pulse 72, temperature 98.9 F (37.2 C), temperature source Axillary, resp. rate 15, height 5\' 9"  (1.753 m), weight 77 kg,  SpO2 98 %.   GENERAL: 74 y.o.male sitting up in bed in NAD.   HEENT: Normal  ABDOMEN: soft  EXTREMITIES: No edema   BACK: Normal  SKIN: Normal by inspection.    MENTAL STATUS: He is awake and alert.  He does have a moderate dysarthria.     CRANIAL NERVES: Pupils are equal, round and reactive to light and accomodation; extra ocular movements are full, there is no significant nystagmus; visual fields are full; there is flattening of the nasolabial fold - L; tongue is midline  MOTOR: Left upper extremity 2/5 in the left leg 1/5.  Right side 5/5.  COORDINATION: No dysmetria noted on the right side.  ASSESSMENT/PLAN [name] is a 75 y.o. male with history of hepatitis C, diabetes, schizophrenia, tobacco use, ongoing cocaine abuse, possible amyloid angiopathy, and hx of being found down in December 2021 with rapid shifts in his mentation. The pt presented to the ER at [facility] for 2 weeks of generalized weakness, LLE weakness, difficulty walking, and incoordination. He was  found to be extremely hypertensive -> treated with cleviprex and tx'd to [facility] ED after a CT Head showed a right frontal intraparenchymal hemorrhage. He did not receive tPA due to [place].  Right frontal intraparenchymal hemorrhage likely due to severe hypertension secondary to cocaine use and amyloid angiopathy.   Resultant left hemiparesis and dysarthria  Code Stroke CT Head - not ordered  CT head - 3.4 cm x 1.7 cm x 2.8 cm acute right frontoparietal parenchymal bleed  MRI head W&WO - Stable hemorrhage  2D Echo -EF 70%, No thrombus, wall motion abnormality or shunt found.   LDL - 59  HgbA1c - 10.1  UDS - positive for cocaine  VTE prophylaxis - SCDs Diet  Diet Order            Diet Carb Modified Fluid consistency: Thin; Room service appropriate? Yes  Diet effective now                 No antithrombotic/antiplatelet prior to admission.   No anticoagulant or antiplatelets    Ongoing aggressive stroke risk factor management  Transfer to floor with hospitalist team to assume care tomorrow per protocol  Therapy recommendations:  CIR  Disposition:  Pending  Hypertension  Home BP meds: none, uncontrolled, unclear history  Current BP meds: Off Cleviprex. Norvasc 5mg  daily started. Continue normodyne prn  Stable . SBP goal < 140 mm Hg for the first 24 hours then < 160 mm Hg. . 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   Known Meningioma  Stable on imaging  Other Stroke Risk Factors  Advanced age  Cigarette smoker - advised to stop smoking  Previous ETOH use  Substance abuse  Other Active Problems, Findings, Recommendations and/or Plan  Code status - Full Code  Hepatitis C  Schizophrenia hx - Zyprexa pta  Leukocytosis - WBC's - 11.1->13.0->10.8   Hospital day # 2   To contact Stroke Continuity provider, please refer to [email or web address] After hours, contact General Neurology

## 2020-10-16 NOTE — Consult Note (Addendum)
Physical Medicine and Rehabilitation Consult Reason for Consult: Weakness with gait disorder Referring Physician: Dr. Leonie Man   HPI: Lance [name] is a 75 y.o. right-handed male with history of diabetes mellitus, hepatitis C and schizophrenia as well as alcohol and polysubstance abuse.  Per chart review patient lives alone.  1 level home 3 steps to entry.  Used a Rollator for the past few weeks due to gait dysfunction.  Presented 10/14/2020 with weakness x2 weeks especially left lower extremity with gait disorder.  CT of the head showed a 3.4 x 1.7 x 2.8 cm acute right frontal parietal parenchymal bleed.  CT cervical spine negative.  MRI follow-up showed stable intra-axial hemorrhage in the right superior frontal gyrus motor strip.  Stable right anterior frontal convexity en plaque meningioma since December.  Small bilateral subdural hematomas resolved since earlier tracings of December.  Echocardiogram ejection fraction of 70 to 09% grade 1 diastolic dysfunction.  Admission chemistries unremarkable except glucose 167, alcohol negative, urine drug screen positive cocaine.  Neurology consulted conservative care close monitoring of blood pressure.  Tolerating a regular consistency diet.  Due to patient's decreased functional mobility recommendations of physical medicine rehab consult.   Review of Systems  Constitutional: Negative for chills and fever.  HENT: Negative for hearing loss.   Eyes: Negative for blurred vision and double vision.  Respiratory: Negative for cough and shortness of breath.   Cardiovascular: Negative for chest pain and palpitations.  Gastrointestinal: Positive for constipation. Negative for heartburn, nausea and vomiting.  Genitourinary: Negative for dysuria, flank pain and hematuria.  Musculoskeletal: Positive for falls and myalgias.  Skin: Negative for rash.  Neurological: Positive for weakness.  Psychiatric/Behavioral:       Schizophrenia  All other systems  reviewed and are negative.  Past Medical History:  Diagnosis Date  . Diabetes mellitus without complication ([place])   . Hepatitis C   . Schizophrenia [facility])    Past Surgical History:  Procedure Laterality Date  . COLONOSCOPY WITH PROPOFOL N/A 08/15/2020   Procedure: COLONOSCOPY WITH PROPOFOL;  Surgeon: Lucilla Lame, MD;  Location: [facility] ENDOSCOPY;  Service: Endoscopy;  Laterality: N/A;  . ESOPHAGOGASTRODUODENOSCOPY (EGD) WITH PROPOFOL N/A 10/10/2016   Procedure: ESOPHAGOGASTRODUODENOSCOPY (EGD) WITH PROPOFOL;  Surgeon: Lucilla Lame, MD;  Location: [facility] ENDOSCOPY;  Service: Endoscopy;  Laterality: N/A;  . HERNIA REPAIR    . VISCERAL ARTERY INTERVENTION N/A 10/11/2016   Procedure: Visceral Artery Intervention;  Surgeon: Algernon Huxley, MD;  Location: [facility];  Service: Cardiovascular;  Laterality: N/A;   Family History  Problem Relation Age of Onset  . Diabetes Sister    Social History:  reports that he has been smoking. He has a 2.50 pack-year smoking history. He has never used smokeless tobacco. He reports previous alcohol use of about 10.0 standard drinks of alcohol per week. He reports that he does not use drugs. Allergies: No Known Allergies Medications Prior to Admission  Medication Sig Dispense Refill  . acetaminophen (TYLENOL) 650 MG CR tablet Take 650 mg by mouth every 8 (eight) hours as needed for pain.    Marland Kitchen albuterol (VENTOLIN HFA) 108 (90 Base) MCG/ACT inhaler Inhale 2 puffs into the lungs every 6 (six) hours as needed for wheezing or shortness of breath. 18 g 0  . atorvastatin (LIPITOR) 20 MG tablet Take 20 mg by mouth daily.    . citalopram (CELEXA) 10 MG tablet Take 1 tablet (10 mg total) by mouth daily. 30 tablet 0  .  diclofenac Sodium (VOLTAREN) 1 % GEL Apply 2 g topically daily as needed (For pain).    . feeding supplement (ENSURE ENLIVE / ENSURE PLUS) LIQD Take 237 mLs by mouth 3 (three) times daily between meals. 21330 mL 0  . ferrous sulfate 325 (65 FE) MG tablet  Take 1 tablet (325 mg total) by mouth daily with breakfast. 30 tablet 0  . gabapentin (NEURONTIN) 300 MG capsule Take 1 capsule by mouth 3 (three) times daily.    . insulin aspart (NOVOLOG) 100 UNIT/ML FlexPen 4 units subcutaneous injection with meals (okay to substitute any short acting insulin pen that is covered) (Patient taking differently: Inject 4 Units into the skin 3 (three) times daily with meals. 4 units subcutaneous injection with meals (okay to substitute any short acting insulin pen that is covered)) 15 mL 0  . insulin detemir (LEVEMIR) 100 UNIT/ML FlexPen Inject 18 Units into the skin at bedtime. 15 mL 1  . Insulin Pen Needle 34G X 3.5 MM MISC 1 Dose by Does not apply route 4 (four) times daily -  before meals and at bedtime. 200 each 0  . Multiple Vitamin (MULTI-VITAMIN) tablet Take 1 tablet by mouth daily.    Marland Kitchen OLANZapine (ZYPREXA) 5 MG tablet Take 5 mg by mouth daily.    . polyethylene glycol (MIRALAX / GLYCOLAX) 17 g packet Take 17 g by mouth daily as needed for mild constipation. 30 each 0  . polyvinyl alcohol (ARTIFICIAL TEARS) 1.4 % ophthalmic solution Place 1 drop into both eyes as needed for dry eyes. (Patient taking differently: Place 1 drop into both eyes daily as needed for dry eyes.) 15 mL 0  . sucralfate (CARAFATE) 1 g tablet Take 1 g by mouth daily.    . tamsulosin (FLOMAX) 0.4 MG CAPS capsule Take 2 capsules (0.8 mg total) by mouth daily. 60 capsule 0  . thiamine 100 MG tablet Take 1 tablet (100 mg total) by mouth daily. 30 tablet 0  . vitamin B-12 1000 MCG tablet Take 1 tablet (1,000 mcg total) by mouth daily. 30 tablet 0    Home: Home Living Family/patient expects to be discharged to:: Private residence Living Arrangements: Alone Available Help at Discharge: Family,Available PRN/intermittently Type of Home: House Home Access: Stairs to enter [employer] of Steps: 3 Entrance Stairs-Rails: Right Home Layout: One level Biochemist, clinical: Standard Home  Equipment: Walker - 4 wheels,Cane - single point  Functional History: Prior Function Level of Independence: Needs assistance Gait / Transfers Assistance Needed: Using Rollator in past couple weeks when ambulatory dysfunction occurred ADL's / Homemaking Assistance Needed: Pt family drives him to grocery shopping, appointment Comments: Poor historian Functional Status:  Mobility: Bed Mobility Overal bed mobility: Needs Assistance Bed Mobility: Supine to Sit,Sit to Supine Supine to sit: Max assist Sit to supine: Max assist General bed mobility comments: Difficulty sequencing, assisting minimally with bringing RLE off edge of bed, assist for LLE and pulling trunk to upright position. Also maxA for return to supine, +2 to scoot up in the bed Transfers Overall transfer level: Needs assistance Equipment used: None Transfers: Sit to/from Stand Sit to Stand: Max assist General transfer comment: Face to face transfer with knee block, maxA to initiate, unable to weight shift to take steps      ADL:    Cognition: Cognition Overall Cognitive Status: Impaired/Different from baseline Orientation Level: Oriented to person,Oriented to place,Oriented to situation,Disoriented to time Cognition Arousal/Alertness: Awake/alert Behavior During Therapy: Flat affect Overall Cognitive Status: Impaired/Different from baseline  Area of Impairment: Orientation,Attention,Memory,Following commands,Safety/judgement,Awareness,Problem solving Orientation Level: Disoriented to,Place,Time,Situation Current Attention Level: Sustained Memory: Decreased short-term memory Following Commands: Follows one step commands inconsistently Safety/Judgement: Decreased awareness of deficits,Decreased awareness of safety Awareness: Intellectual Problem Solving: Slow processing,Difficulty sequencing,Requires verbal cues,Requires tactile cues General Comments: Pt oriented to self only. Able to correctly state month and year  with options. Knows he is in the hospital, but does not know which one. Pt demonstrates STM deficits, cannot recall after re-orientation. Repetition required for 1 step commands.  Blood pressure (!) 138/94, pulse 72, temperature 98.9 F (37.2 C), temperature source Axillary, resp. rate 15, height 5\' 9"  (1.753 m), weight 77 kg, SpO2 98 %. Physical Exam Vitals and nursing note reviewed.  Constitutional:      Appearance: He is normal weight.  Eyes:     Extraocular Movements: Extraocular movements intact.     Conjunctiva/sclera: Conjunctivae normal.     Pupils: Pupils are equal, round, and reactive to light.  Cardiovascular:     Rate and Rhythm: Normal rate and regular rhythm.     Heart sounds: Normal heart sounds.  Pulmonary:     Effort: Pulmonary effort is normal.     Breath sounds: Normal breath sounds.  Abdominal:     General: Abdomen is flat. Bowel sounds are normal. There is no distension.     Palpations: Abdomen is soft.  Musculoskeletal:     Comments: No pain with upper limb or lower limb range of motion  Skin:    General: Skin is warm and dry.  Neurological:     Mental Status: He is lethargic.     Motor: Weakness and abnormal muscle tone present. No tremor.     Coordination: Coordination abnormal.     Gait: Gait abnormal.     Comments: Patient is alert in no acute distress.  Speech is dysarthric but intelligible.  Provides name and age.  Follows simple commands. Patient not oriented to place or time, thinks he is in [place] and in the hospital  Increased tone left pectoralis  Motor strength difficult to assess secondary to level of alertness.  The patient has 3 - hand grip on the left 0 proximally, 0/5 in the left hip flexor knee extensor ankle dorsiflexor 4-/5 in the right upper and right lower limb. Sensory difficult to assess secondary to lethargy and confusion.    Psychiatric:        Mood and Affect: Mood normal.        Behavior: Behavior normal.     Results  for orders placed or performed during the hospital encounter of 10/14/20 (from the past 24 hour(s))  Glucose, capillary     Status: Abnormal   Collection Time: 10/15/20 11:29 AM  Result Value Ref Range   Glucose-Capillary 480 (H) 70 - 99 mg/dL  Glucose, capillary     Status: Abnormal   Collection Time: 10/15/20 12:38 PM  Result Value Ref Range   Glucose-Capillary 422 (H) 70 - 99 mg/dL  Glucose, capillary     Status: Abnormal   Collection Time: 10/15/20  1:33 PM  Result Value Ref Range   Glucose-Capillary 254 (H) 70 - 99 mg/dL  Glucose, capillary     Status: Abnormal   Collection Time: 10/15/20  2:34 PM  Result Value Ref Range   Glucose-Capillary 142 (H) 70 - 99 mg/dL  Glucose, capillary     Status: None   Collection Time: 10/15/20  3:33 PM  Result Value Ref Range   Glucose-Capillary 81 70 -  99 mg/dL  Glucose, capillary     Status: Abnormal   Collection Time: 10/15/20  4:25 PM  Result Value Ref Range   Glucose-Capillary 109 (H) 70 - 99 mg/dL  Glucose, capillary     Status: Abnormal   Collection Time: 10/15/20  6:02 PM  Result Value Ref Range   Glucose-Capillary 216 (H) 70 - 99 mg/dL  Glucose, capillary     Status: Abnormal   Collection Time: 10/15/20  9:20 PM  Result Value Ref Range   Glucose-Capillary 186 (H) 70 - 99 mg/dL  CBC     Status: Abnormal   Collection Time: 10/16/20  4:23 AM  Result Value Ref Range   WBC 10.8 (H) 4.0 - 10.5 K/uL   RBC 4.64 4.22 - 5.81 MIL/uL   Hemoglobin 14.1 13.0 - 17.0 g/dL   HCT 43.4 39.0 - 52.0 %   MCV 93.5 80.0 - 100.0 fL   MCH 30.4 26.0 - 34.0 pg   MCHC 32.5 30.0 - 36.0 g/dL   RDW 13.0 11.5 - 15.5 %   Platelets 223 150 - 400 K/uL   nRBC 0.0 0.0 - 0.2 %  Basic metabolic panel     Status: Abnormal   Collection Time: 10/16/20  4:23 AM  Result Value Ref Range   Sodium 136 135 - 145 mmol/L   Potassium 3.9 3.5 - 5.1 mmol/L   Chloride 103 98 - 111 mmol/L   CO2 24 22 - 32 mmol/L   Glucose, Bld 201 (H) 70 - 99 mg/dL   BUN 15 8 - 23 mg/dL    Creatinine, Ser 0.83 0.61 - 1.24 mg/dL   Calcium 8.6 (L) 8.9 - 10.3 mg/dL   GFR, Estimated >60 >60 mL/min   Anion gap 9 5 - 15  Glucose, capillary     Status: Abnormal   Collection Time: 10/16/20  7:33 AM  Result Value Ref Range   Glucose-Capillary 222 (H) 70 - 99 mg/dL   DG Chest 2 View  Result Date: 10/14/2020 CLINICAL DATA:  Neck pain and left leg weakness. EXAM: CHEST - 2 VIEW COMPARISON:  May 14, 2020 FINDINGS: Chronic appearing increased interstitial lung markings are seen without evidence of acute infiltrate, pleural effusion or pneumothorax. The heart size and mediastinal contours are within normal limits. Degenerative changes seen involving the right shoulder and thoracic spine. Ill-defined suture like material is seen overlying the left upper quadrant. IMPRESSION: Stable exam without active cardiopulmonary disease. Electronically Signed   By: Virgina Norfolk M.D.   On: 10/14/2020 19:27   CT Head Wo Contrast  Result Date: 10/14/2020 CLINICAL DATA:  Neck pain and left leg weakness. EXAM: CT HEAD WITHOUT CONTRAST TECHNIQUE: Contiguous axial images were obtained from the base of the skull through the vertex without intravenous contrast. COMPARISON:  March 22, 2020 FINDINGS: Brain: There is mild cerebral atrophy with widening of the extra-axial spaces and ventricular dilatation. There are areas of decreased attenuation within the white matter tracts of the supratentorial brain, consistent with microvascular disease changes. A 3.4 cm x 1.7 cm x 2.8 cm lobulated acute parenchymal bleed is seen within the parasagittal portion of the frontoparietal region on the right. Very mild mass effect is seen on the adjacent sulci with no evidence of midline shift. A stable meningioma is seen along the anterior aspect of the anterior cranial fossa with a mild amount of stable mass-effect noted on the right frontal lobe. Vascular: No hyperdense vessel or unexpected calcification. Skull: Normal. Negative  for fracture or  focal lesion. Sinuses/Orbits: No acute finding. Other: Persistent moderate to marked severity mildly dense subcutaneous inflammatory fat stranding is seen along the posterior fossa on the right. IMPRESSION: 1. 3.4 cm x 1.7 cm x 2.8 cm acute right frontoparietal parenchymal bleed, as described above. MRI correlation is recommended. 2. Stable right anterior cranial fossa meningioma. Electronically Signed   By: Virgina Norfolk M.D.   On: 10/14/2020 19:37   CT Cervical Spine Wo Contrast  Result Date: 10/14/2020 CLINICAL DATA:  Neck pain and left leg weakness. EXAM: CT CERVICAL SPINE WITHOUT CONTRAST TECHNIQUE: Multidetector CT imaging of the cervical spine was performed without intravenous contrast. Multiplanar CT image reconstructions were also generated. COMPARISON:  None. FINDINGS: Alignment: Normal. Skull base and vertebrae: No acute fracture. Chronic changes are seen involving the body and tip of the dens as well as the anterior arch of C1. Soft tissues and spinal canal: No prevertebral fluid or swelling. No visible canal hematoma. Disc levels: Marked severity endplate sclerosis is seen at the levels of C3-C4, C4-C5 and C5-C6. Moderate to marked severity intervertebral disc space narrowing is seen at the level of C5-C6. Marked severity bilateral multilevel facet joint hypertrophy is noted. Upper chest: Negative. Other: None. IMPRESSION: 1. No evidence of acute cervical spine fracture or subluxation. 2. Marked severity multilevel degenerative changes. Electronically Signed   By: Virgina Norfolk M.D.   On: 10/14/2020 19:41   MR BRAIN W WO CONTRAST  Result Date: 10/15/2020 CLINICAL DATA:  75 year old male with superior right hemisphere intra-axial hemorrhage on presentation head CT for left leg weakness. Suspicion of amyloid angiopathy, and also demonstration of a 6 cm right anterior frontal convexity meningioma invading and occluding the anterior segment of the superior sagittal sinus on  December MRI. EXAM: MRI HEAD WITHOUT AND WITH CONTRAST TECHNIQUE: Multiplanar, multiecho pulse sequences of the brain and surrounding structures were obtained without and with intravenous contrast. CONTRAST:  7.37mL GADAVIST GADOBUTROL 1 MMOL/ML IV SOLN COMPARISON:  Head and cervical spine CT 10/14/2020. Brain MRI 05/15/2020. FINDINGS: Brain: Right superior frontal gyrus motor strip intra-axial hemorrhage encompassing 41 by 22 x 33 mm (AP by transverse by CC), estimated volume 15 mL) with mostly hyperintense T2 and isointense T1 signal. Surrounding edema is contiguous with other chronic white matter T2 and FLAIR hyperintensity. There is only mild regional mass effect. Abnormal diffusion associated with the blood products, no larger area of restricted diffusion. No contrast enhancement. No diffusion restriction elsewhere in the brain. The previous small bilateral subdural hematomas seen in December appear resolved. Superimposed anterior right frontal convexity en plaque type meningioma appears stable and size and configuration since December, roughly 6.6 cm long axis, 1.9 cm thickness. Stable mass effect on the underlying brain with no convincing cerebral edema. Innumerable micro hemorrhages on SWI redemonstrated in addition to the acute parenchymal hemorrhage (series 14, image 43). Stable gray and white matter signal elsewhere. No ventriculomegaly or intraventricular hemorrhage. Basilar cisterns remain normal. Negative pituitary and cervicomedullary junction. No other abnormal intracranial enhancement. Vascular: Major intracranial vascular flow voids are stable since December, including loss of the anterior but preserved posterior superior sagittal sinus dural venous flow void. And the other major dural venous sinuses are enhancing and appear to be patent following contrast today. Skull and upper cervical spine: Stable, negative for age. Sinuses/Orbits: Stable, negative. Other: Mastoids remain clear. Visible internal  auditory structures appear normal. IMPRESSION: 1. Stable intra-axial hemorrhage in the right superior frontal gyrus/motor strip since yesterday, estimated volume 15 mL. 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 Mild surrounding edema. No significant mass effect or other complicating features. 2. Stable right anterior frontal convexity en plaque Meningioma since December. Anterior segment of the superior sagittal sinus occluded as before but the other dural venous sinuses remain patent. 3. Small bilateral subdural hematomas have resolved since December. Electronically Signed   By: Genevie Ann M.D.   On: 10/15/2020 11:28   ECHOCARDIOGRAM COMPLETE  Result Date: 10/15/2020    ECHOCARDIOGRAM REPORT   Patient Name:   WADSWORTH SKOLNICK Date of Exam: 10/15/2020 Medical Rec #:  [id number]        Height:       69.0 in Accession #:    [id number]       Weight:       169.8 lb Date of Birth:  01/27/1946        BSA:          1.927 m Patient Age:    38 years         BP:           122/77 mmHg Patient Gender: M                HR:           83 bpm. Exam Location:  Inpatient Procedure: 2D Echo Indications:    stroke  History:        Patient has prior history of Echocardiogram examinations, most                 recent 05/15/2020. Hepatitis C; Risk Factors:Diabetes and IV drug                 use.  Sonographer:    Johny Chess Referring Phys: [id number] ASHISH ARORA IMPRESSIONS  1. Left ventricular ejection fraction, by estimation, is 70 to 75%. The left ventricle has hyperdynamic function. The left ventricle has no regional wall motion abnormalities. There is mild concentric left ventricular hypertrophy. Left ventricular diastolic parameters are consistent with Grade I diastolic dysfunction (impaired relaxation).  2. Right ventricular systolic function is normal. The right ventricular size is normal.  3. The mitral valve is normal in structure. No evidence of mitral valve regurgitation. No  evidence of mitral stenosis.  4. The aortic valve is normal in structure. Aortic valve regurgitation is not visualized. No aortic stenosis is present.  5. The inferior vena cava is normal in size with greater than 50% respiratory variability, suggesting right atrial pressure of 3 mmHg. FINDINGS  Left Ventricle: Left ventricular ejection fraction, by estimation, is 70 to 75%. The left ventricle has hyperdynamic function. The left ventricle has no regional wall motion abnormalities. The left ventricular internal cavity size was normal in size. There is mild concentric left ventricular hypertrophy. Left ventricular diastolic parameters are consistent with Grade I diastolic dysfunction (impaired relaxation). Right Ventricle: The right ventricular size is normal. No increase in right ventricular wall thickness. Right ventricular systolic function is normal. Left Atrium: Left atrial size was normal in size. Right Atrium: Right atrial size was normal in size. Prominent Eustachian valve. Pericardium: There is no evidence of pericardial effusion. Mitral Valve: The mitral valve is normal in structure. No evidence of mitral valve regurgitation. No evidence of mitral valve stenosis. Tricuspid Valve: The tricuspid valve is normal in structure. Tricuspid valve regurgitation is not demonstrated. No evidence of tricuspid stenosis. Aortic Valve: The aortic valve is normal in structure. Aortic valve regurgitation is not visualized. No aortic stenosis  is present. Pulmonic Valve: The pulmonic valve was normal in structure. Pulmonic valve regurgitation is not visualized. No evidence of pulmonic stenosis. Aorta: The aortic root is normal in size and structure. Venous: The inferior vena cava is normal in size with greater than 50% respiratory variability, suggesting right atrial pressure of 3 mmHg. IAS/Shunts: No atrial level shunt detected by color flow Doppler.  LEFT VENTRICLE PLAX 2D LVIDd:         4.20 cm  Diastology LVIDs:          2.80 cm  LV e' medial:    6.20 cm/s LV PW:         1.40 cm  LV E/e' medial:  8.6 LV IVS:        1.40 cm  LV e' lateral:   9.25 cm/s LVOT diam:     2.10 cm  LV E/e' lateral: 5.8 LVOT Area:     3.46 cm  RIGHT VENTRICLE             IVC RV S prime:     18.50 cm/s  IVC diam: 1.70 cm TAPSE (M-mode): 2.3 cm LEFT ATRIUM             Index       RIGHT ATRIUM           Index LA diam:        3.40 cm 1.76 cm/m  RA Area:     16.70 cm LA Vol (A2C):   56.4 ml 29.27 ml/m RA Volume:   42.60 ml  22.11 ml/m LA Vol (A4C):   36.1 ml 18.73 ml/m LA Biplane Vol: 46.1 ml 23.92 ml/m   AORTA Ao Root diam: 3.20 cm MITRAL VALVE MV Area (PHT): 2.56 cm    SHUNTS MV Decel Time: 296 msec    Systemic Diam: 2.10 cm MV E velocity: 53.60 cm/s MV A velocity: 75.40 cm/s MV E/A ratio:  0.71 Mihai Croitoru MD Electronically signed by Sanda Klein MD Signature Date/Time: 10/15/2020/3:53:16 PM    Final     Assessment/Plan: Diagnosis: 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  Left hemiplegia and lethargy 1. Does the need for close, 24 hr/day medical supervision in concert with the patient's rehab needs make it unreasonable for this patient to be served in a less intensive setting? Yes 2. Co-Morbidities requiring supervision/potential complications: Hypertension, cocaine abuse 3. Due to bladder management, bowel management, safety, skin/wound care, disease management, medication administration, pain management and patient education, does the patient require 24 hr/day rehab nursing? Yes 4. Does the patient require coordinated care of a physician, rehab nurse, therapy disciplines of PT, OT, speech to address physical and functional deficits in the context of the above medical diagnosis(es)? Yes Addressing deficits in the following areas: balance, endurance, locomotion, strength, transferring, bowel/bladder control, bathing, dressing, feeding, grooming, toileting, cognition, speech, language, swallowing and psychosocial  support 5. Can the patient actively participate in an intensive therapy program of at least 3 hrs of therapy per day at least 5 days per week? Yes 6. The potential for patient to make measurable gains while on inpatient rehab is excellent 7. Anticipated functional outcomes upon discharge from inpatient rehab are min assist and mod assist  with PT, min assist and mod assist with OT, supervision with SLP. 8. Estimated rehab length of stay to reach the above functional goals is: 23 to 25 days 9. Anticipated discharge destination: Home 10. Overall Rehab/Functional Prognosis: fair  RECOMMENDATIONS: This patient's condition is appropriate for continued  rehabilitative care in the following setting: CIR once able to participate in  PT OT sessions.  Up in chair 3 hours/day Patient has agreed to participate in recommended program. N/A Note that insurance prior authorization may be required for reimbursement for recommended care.  Comment: Patient states that he lives alone but he has a daughter in [place] that can help him.   Lavon Paganini Angiulli, PA-C 10/16/2020   "I have personally performed a face to face diagnostic evaluation of this patient.  Additionally, I have reviewed and concur with the physician assistant's documentation above." Charlett Blake M.D. [facility] Fellow Am Acad of Phys Med and Rehab Diplomate Am Board of Electrodiagnostic Med Fellow Am Board of Interventional Pain

## 2020-10-16 NOTE — PMR Pre-admission (Signed)
PMR Admission Coordinator Pre-Admission Assessment  Patient: Lance [name] is an 75 y.o., male MRN: [id number] DOB: 1945-12-15 Height: 5' 9" (175.3 cm) Weight: 77 kg              Insurance Information HMO: yes    PPO:      PCP:      IPA:      80/20:      OTHER:  PRIMARY: [employer]      Policy#: [id number]      Subscriber: patient CM Name: Lance [name] with follow up by Lance [name]      Phone#: [phone] Ext [phone]    Fax#: [phone] Pre-Cert#: [id number] for 7 days with update due on 10/25/20      Employer: Retired Benefits:  Phone #: [phone]     Name: [email or web address] Eff. Date: 09/08/20     Deduct: $233 ($0 met)      Out of Pocket Max: $3450 (met $0)      Life Max: N/A  CIR: $2524.00 per admission      SNF: 100% coverage Outpatient: 80%     Co-Pay: 20% Home Health: 100%      Co-Pay: none DME: 80%     Co-Pay: 20% Providers: in-network  SECONDARY: Medicaid [place] Access      Policy#: [id number]      Phone#: [phone]  Financial Counselor:       Phone#:   The "Data Collection Information Summary" for patients in Inpatient Rehabilitation Facilities with attached "Privacy Act [place] Records" was provided and verbally reviewed with: Patient and Family  Emergency Contact Information Contact Information    Name Relation Home Work Mobile   Lance [name] Daughter   [phone]     Current Medical History  Patient Admitting Diagnosis:  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  Left hemiplegia and lethargy  History of Present Illness: A 75 year old right-handed male with history of diabetes mellitus, hepatitis C and schizophrenia as well as alcohol and polysubstance use.  Per chart review patient lives alone.  1 level home 3 steps to entry.  Used a Rollator for the past few weeks due to gait dysfunction.  Presented 10/14/2020 with weakness x2 weeks especially left lower extremity with gait disorder.  CT of the head showed a 3.4 x 1.7 x  2.8 acute right frontal parietal parenchymal bleed.  CT cervical spine negative.  MRI follow-up showed stable intra-axial hemorrhage in the right superior frontal gyrus motor strip.  Stable right anterior frontal convexity en plaque meningioma since December with comparison of prior imaging.  Small bilateral subdural hematomas resolved since earlier tracings of December.  Prior MRIs were also suggestive of chronic microangiopathy and mild hemorrhages concerning for possible amyloid angiopathy.  Echocardiogram with ejection fraction of 70 to 80% grade 1 diastolic dysfunction.  Admission chemistries unremarkable except glucose 167 alcohol negative urine drug screen positive cocaine.  Neurology follow-up conservative care close monitoring of blood pressure.  Tolerating a regular consistency diet.  Due to patient's decreased functional ability he is to be  admitted for a comprehensive inpatient rehab program.   Complete NIHSS TOTAL: 7 Glasgow Coma Scale Score: 15  Past Medical History  Past Medical History:  Diagnosis Date  . Diabetes mellitus without complication ([place])   . Hepatitis C   . Schizophrenia ([place])     Family History  family history includes Diabetes in his sister.  Prior Rehab/Hospitalizations:  Has the patient had prior rehab or hospitalizations prior to admission? Yes  Has  the patient had major surgery during 100 days prior to admission? Yes  Current Medications   Current Facility-Administered Medications:  .   stroke: mapping our early stages of recovery book, , Does not apply, Once, Bailey-Modzik, Delila A, NP .  acetaminophen (TYLENOL) tablet 650 mg, 650 mg, Oral, Q4H PRN, 650 mg at 10/18/20 2041 **OR** acetaminophen (TYLENOL) 160 MG/5ML solution 650 mg, 650 mg, Per Tube, Q4H PRN **OR** acetaminophen (TYLENOL) suppository 650 mg, 650 mg, Rectal, Q4H PRN, Bailey-Modzik, Delila A, NP .  [START ON 10/20/2020] amLODipine (NORVASC) tablet 10 mg, 10 mg, Oral, Daily, Bonnielee Haff,  MD .  insulin aspart (novoLOG) injection 0-15 Units, 0-15 Units, Subcutaneous, TID WC, Bailey-Modzik, Delila A, NP, 3 Units at 10/19/20 0700 .  insulin aspart (novoLOG) injection 0-5 Units, 0-5 Units, Subcutaneous, QHS, Bailey-Modzik, Delila A, NP, 3 Units at 10/18/20 2209 .  insulin aspart (novoLOG) injection 7 Units, 7 Units, Subcutaneous, TID WC, Ezekiel Slocumb, DO, 7 Units at 10/19/20 0935 .  insulin detemir (LEVEMIR) injection 22 Units, 22 Units, Subcutaneous, QHS, Bonnielee Haff, MD, 22 Units at 10/18/20 2244 .  labetalol (NORMODYNE) injection 10 mg, 10 mg, Intravenous, Q2H PRN, Greta Doom, MD, 10 mg at 10/17/20 0521 .  mupirocin ointment (BACTROBAN) 2 % 1 application, 1 application, Nasal, BID, Bailey-Modzik, Delila A, NP, 1 application at 16/10/96 1034 .  pantoprazole (PROTONIX) injection 40 mg, 40 mg, Intravenous, QHS, Bailey-Modzik, Delila A, NP, 40 mg at 10/18/20 2101 .  senna-docusate (Senokot-S) tablet 1 tablet, 1 tablet, Oral, BID, Bailey-Modzik, Delila A, NP, 1 tablet at 10/19/20 0935  Patients Current Diet:  Diet Order            Diet Carb Modified Fluid consistency: Thin; Room service appropriate? Yes  Diet effective now                 Precautions / Restrictions Precautions Precautions: Fall,Other (comment) Precaution Comments: L hemiplegia, BP < 160 Restrictions Weight Bearing Restrictions: No   Has the patient had 2 or more falls or a fall with injury in the past year?Yes  Prior Activity Level Limited Community (1-2x/wk): goes to appointements per daughter report  Prior Functional Level Prior Function Level of Independence: Needs assistance Gait / Transfers Assistance Needed: Using Rollator in past couple weeks when ambulatory dysfunction occurred ADL's / Homemaking Assistance Needed: Pt family drives him to grocery shopping, appointment Comments: Poor historian  Self Care: Did the patient need help bathing, dressing, using the toilet or  eating?  Needed some help  Indoor Mobility: Did the patient need assistance with walking from room to room (with or without device)? Independent  Stairs: Did the patient need assistance with internal or external stairs (with or without device)? Independent  Functional Cognition: Did the patient need help planning regular tasks such as shopping or remembering to take medications? Needed some help  Home Assistive Devices / [place] Devices/Equipment: None Home Equipment: Walker - 4 wheels,Cane - single point  Prior Device Use: Indicate devices/aids used by the patient prior to current illness, exacerbation or injury? cane  Current Functional Level Cognition  Arousal/Alertness: Awake/alert Overall Cognitive Status: Impaired/Different from baseline Current Attention Level: Sustained Orientation Level: Oriented X4 Following Commands: Follows one step commands inconsistently,Follows one step commands with increased time Safety/Judgement: Decreased awareness of deficits,Decreased awareness of safety General Comments: Pt pleasant and appropriate in conversation, improved attention though did perseverate on desire for pillows and assist to scratch back. Pt with difficulty understanding cues to  lean forward, to right, etc but improved movements with pt's purposeful movements (bending to reach sock) Attention: Sustained Sustained Attention: Impaired Memory: Impaired Memory Impairment: Retrieval deficit,Storage deficit Awareness: Impaired Awareness Impairment: Emergent impairment,Anticipatory impairment Problem Solving: Impaired Problem Solving Impairment: Verbal basic Safety/Judgment: Impaired    Extremity Assessment (includes Sensation/Coordination)  Upper Extremity Assessment: LUE deficits/detail LUE Deficits / Details: Weak hand grip but strong thumb adduction, elbow flexion/extension 3/5, shoulder flexion 0/5 LUE Sensation: WNL LUE Coordination: decreased fine  motor,decreased gross motor  Lower Extremity Assessment: Defer to PT evaluation LLE Deficits / Details: No active movement noted LLE Sensation: WNL    ADLs  Overall ADL's : Needs assistance/impaired Eating/Feeding: Set up,Sitting Grooming: Minimal assistance,Sitting Grooming Details (indicate cue type and reason): supported sitting using RUE Upper Body Bathing: Moderate assistance,Sitting Lower Body Bathing: Maximal assistance,Sitting/lateral leans,Sit to/from stand Upper Body Dressing : Moderate assistance,Sitting Lower Body Dressing: Maximal assistance,Sitting/lateral leans,Sit to/from stand,+2 for safety/equipment,+2 for physical assistance Lower Body Dressing Details (indicate cue type and reason): Pt did impulsively bend forward to pull up R sock with R UE sitting EOB well though L lateral lean present Toilet Transfer: Maximal assistance,+2 for physical assistance,+2 for safety/equipment Toilet Transfer Details (indicate cue type and reason): use of stedy maxA+2 Toileting- Clothing Manipulation and Hygiene: Total assistance Functional mobility during ADLs: Maximal assistance,+2 for physical assistance,+2 for safety/equipment,Cueing for safety,Cueing for sequencing General ADL Comments: Limited by heavy L lateral lean and difficulty attending to directions to correct. Guided pt in WB tasks, as well as shifting to achieve midline.    Mobility  Overal bed mobility: Needs Assistance Bed Mobility: Rolling,Sidelying to Sit,Sit to Sidelying Rolling: Mod assist Sidelying to sit: Max assist,+2 for physical assistance Supine to sit: Max assist,+2 for physical assistance Sit to supine: Max assist,+2 for physical assistance General bed mobility comments: Pt overall Max A x 2 for all bed mobility, multimodal cues to initiate though pt with difficulty motor planning. Pt modA to roll to R side for adjustment of bed pads    Transfers  Overall transfer level: Needs assistance Equipment used:  None,Ambulation equipment used Transfers: Sit to/from Stand Sit to Stand: Max assist,+2 physical assistance,+2 safety/equipment General transfer comment: deferred due to poor sitting balance    Ambulation / Gait / Stairs / Wheelchair Mobility       Posture / Balance Dynamic Sitting Balance Sitting balance - Comments: Requires grossly Max A to maintain sitting balance and correct to acheive midline. Balance Overall balance assessment: Needs assistance Sitting-balance support: Feet supported Sitting balance-Leahy Scale: Poor Sitting balance - Comments: Requires grossly Max A to maintain sitting balance and correct to acheive midline. Postural control: Posterior lean,Left lateral lean Standing balance support: Bilateral upper extremity supported Standing balance-Leahy Scale: Poor Standing balance comment: standing x2 times with L lateral lean and unltimately used stedy maxA+2 for sit to stand to pivot to recliner    Special needs/care consideration Continuous Drip IV  None, Diabetic management Yes, is DM and is on insulin in acute hospital , Bladder incontinence, External urinary catheter and Designated visitor Antione Obar, daughter; Kenney Houseman, niece     Previous Environmental health practitioner (from acute therapy documentation) Living Arrangements: Alone  Lives With: Alone Available Help at Discharge: Family,Available 24 hours/day Type of Home: House Home Layout: One level Home Access: Stairs to enter Entrance Stairs-Rails: Right,Left,Can reach both Entrance Stairs-Number of Steps: 4 Bathroom Shower/Tub: Chiropodist: Standard Bathroom Accessibility: Yes How Accessible: Accessible via walker [place]: Yes Type of Home  Care Services: [place] (if known): [facility]  Discharge Living Setting Plans for Discharge Living Setting: Patient's home Type of Home at Discharge: House Discharge Home Layout: One level Discharge Home Access: Stairs to  enter Entrance Stairs-Rails: Right,Left,Can reach both Entrance Stairs-Number of Steps: 4 Discharge Bathroom Shower/Tub: Tub/shower unit Discharge Bathroom Toilet: Standard Discharge Bathroom Accessibility: Yes How Accessible: Accessible via walker Does the patient have any problems obtaining your medications?: Yes (Describe) (pt reported sometimes financially difficult to obtain meds)  Social/Family/Support Systems Anticipated Caregiver: Maleik Vanderzee, daughter Anticipated Caregiver's Contact Information: [phone] Caregiver Availability: 24/7 (daughter reported that she and other family will provide care for pt) Discharge Plan Discussed with Primary Caregiver: Yes Is Caregiver In Agreement with Plan?: Yes Does Caregiver/Family have Issues with Lodging/Transportation while Pt is in Rehab?: No  Goals Patient/Family Goal for Rehab: Min-Mod A:PT/OT, Supervision-ST Expected length of stay: 23-25 days Pt/Family Agrees to Admission and willing to participate: Yes Program Orientation Provided & Reviewed with Pt/Caregiver Including Roles  & Responsibilities: Yes  Decrease burden of Care through IP rehab admission: NA  Possible need for SNF placement upon discharge: Not anticipated  Patient Condition: This patient's medical and functional status has changed since the consult dated: 10/16/20 in which the Rehabilitation Physician determined and documented that the patient's condition is appropriate for intensive rehabilitative care in an inpatient rehabilitation facility. See "History of Present Illness" (above) for medical update. Functional changes are: Currently requiring max assist +2 for mobility and ADLs. Patient's medical and functional status update has been discussed with the Rehabilitation physician and patient remains appropriate for inpatient rehabilitation. Will admit to inpatient rehab today.  Preadmission Screen Completed By:  [name], RN, 10/19/2020 12:01  PM ______________________________________________________________________   Discussed status with Dr. Dagoberto Ligas on 10/19/20 at 1000 and received approval for admission today.  Admission Coordinator:  [name], time 1201/Date 10/19/20

## 2020-10-16 NOTE — Progress Notes (Signed)
Inpatient Rehab Admissions:   Inpatient Rehab Consult received.  I met with patient at the bedside for rehabilitation assessment and to discuss goals and expectations of an inpatient rehab admission.  Pt acknowledged understanding of goals and expectations. Pt interested in pursuing CIR.  Pt gave permission to contact daughter, [name].  Spoke with [name] via phone. She acknowledged understanding of CIR goals and expectations. She is interested in pt pursuing CIR as well.  Will continue to follow.  Signed: Gayland Curry, [place], [place] Admissions Coordinator [phone]

## 2020-10-16 NOTE — Progress Notes (Signed)
Physical Therapy Treatment Patient Details Name: Lance [name] MRN: [id number] DOB: 07-07-1945 Today's Date: 10/16/2020    History of Present Illness Pt is a 75 y.o. M who presents to ER after 2 weeks of weakness and difficulty walking. Pt positive for cocaine. CT head showing right frontal intraparenchymal hemorrhage. MRI 5/8 showing stable intra axial hemorrhage in R superior frontal gyrus and stable right anterior frontal convexity en plaque meningioma. Significant PMH: diabetes mellitus, hepatitis C, schizophrenia.    PT Comments    Pt progressing towards his physical therapy goals. Focused on sitting balance, transfer training and promoting left sided awareness. Pt requiring two person maximal assist for bed mobility and transfers. Ultimately utilized Denna Haggard to progress from bed to chair. Pt able to complete functional tasks with LUE with gravity eliminated. Still no active movement noted in LLE. BP supine: 144/94, sitting: 153/97, sitting post mobility: 151/89. Continue to recommend comprehensive inpatient rehab (CIR) for post-acute therapy needs.   Follow Up Recommendations  CIR     Equipment Recommendations  Wheelchair (measurements PT);Wheelchair cushion (measurements PT);Hospital bed;3in1 (PT)    Recommendations for Other Services       Precautions / Restrictions Precautions Precautions: Fall;Other (comment) Precaution Comments: L hemiplegia, BP < 160 Restrictions Weight Bearing Restrictions: No    Mobility  Bed Mobility Overal bed mobility: Needs Assistance Bed Mobility: Supine to Sit     Supine to sit: Max assist;+2 for physical assistance     General bed mobility comments: Pt with difficulty sequencing through moving to R side of bed; assist with rolling to R and trunk elevation with helicopter method to EOB. Pt requiring assist for static sitting at EOB due to L lateral lean    Transfers Overall transfer level: Needs assistance Equipment used:  None;Ambulation equipment used Transfers: Sit to/from Stand Sit to Stand: Max assist;+2 physical assistance;+2 safety/equipment         General transfer comment: maxA +2 for knee blocking for standing x2 times and unable to take steps; LLE not bearing any weight. Pt usign stedy for transfer to recliner with maxA+2 for sit to stand from stedy with heavy L lateral lean.  Ambulation/Gait                 Stairs             Wheelchair Mobility    Modified Rankin (Stroke Patients Only) Modified Rankin (Stroke Patients Only) Pre-Morbid Rankin Score: Slight disability Modified Rankin: Severe disability     Balance Overall balance assessment: Needs assistance Sitting-balance support: Feet supported Sitting balance-Leahy Scale: Poor Sitting balance - Comments: supervisionA to maxA for static sitting with L lateral laen Postural control: Left lateral lean Standing balance support: Bilateral upper extremity supported Standing balance-Leahy Scale: Poor Standing balance comment: standing x2 times with L lateral lean and unltimately used stedy maxA+2 for sit to stand to pivot to recliner                            Cognition Arousal/Alertness: Awake/alert Behavior During Therapy: Flat affect Overall Cognitive Status: Impaired/Different from baseline Area of Impairment: Orientation;Attention;Memory;Following commands;Safety/judgement;Awareness;Problem solving                 Orientation Level: Disoriented to;Place;Time;Situation Current Attention Level: Focused Memory: Decreased short-term memory Following Commands: Follows one step commands inconsistently;Follows one step commands with increased time Safety/Judgement: Decreased awareness of deficits;Decreased awareness of safety Awareness: Emergent Problem Solving: Slow processing;Difficulty sequencing;Requires verbal  cues;Requires tactile cues General Comments: Pt oriented to self and hospital, able to  state month with choices and year after given answer. Pt able to recall year after 10 mins. Pt perform Short Blessed Test, but was unable to attempt counting 20 to 1 and naming the months of the year in reverse order  scoring  >10 consistent with dementia, but would require cognitive evaluation for further information. Pt following 1 step commands with moderate cues to attend to task as pt with poor ability to sequence through with focused attention and emergent awareness. Pt stating "something ain't right on my left side."      Exercises      General Comments General comments (skin integrity, edema, etc.): BPs: 148/89 at rest; 143/94 in supine (legs uncrossed), O2 >90%, HR 79 BPM.  Okayed by RN for permissible HTN <150. Pillow places at L side to support and reduce lean on L side.      Pertinent Vitals/Pain Pain Assessment: Faces Faces Pain Scale: Hurts even more Pain Location: head, L neck Pain Descriptors / Indicators: Discomfort;Grimacing;Guarding Pain Intervention(s): Limited activity within patient's tolerance;Monitored during session    Home Living Family/patient expects to be discharged to:: Private residence Living Arrangements: Alone Available Help at Discharge: Family;Available PRN/intermittently Type of Home: House Home Access: Stairs to enter Entrance Stairs-Rails: Right Home Layout: One level Home Equipment: Walker - 4 wheels;Cane - single point      Prior Function Level of Independence: Needs assistance  Gait / Transfers Assistance Needed: Using Rollator in past couple weeks when ambulatory dysfunction occurred ADL's / Homemaking Assistance Needed: Pt family drives him to grocery shopping, appointment Comments: Poor historian   PT Goals (current goals can now be found in the care plan section) Acute Rehab PT Goals Patient Stated Goal: didn't state Potential to Achieve Goals: Good Progress towards PT goals: Progressing toward goals    Frequency    Min  4X/week      PT Plan Current plan remains appropriate    Co-evaluation PT/OT/SLP Co-Evaluation/Treatment: Yes Reason for Co-Treatment: Complexity of the patient's impairments (multi-system involvement);Necessary to address cognition/behavior during functional activity;For patient/therapist safety;To address functional/ADL transfers PT goals addressed during session: Mobility/safety with mobility OT goals addressed during session: ADL's and self-care      AM-PAC PT "6 Clicks" Mobility   Outcome Measure  Help needed turning from your back to your side while in a flat bed without using bedrails?: A Lot Help needed moving from lying on your back to sitting on the side of a flat bed without using bedrails?: A Lot Help needed moving to and from a bed to a chair (including a wheelchair)?: A Lot Help needed standing up from a chair using your arms (e.g., wheelchair or bedside chair)?: A Lot Help needed to walk in hospital room?: Total Help needed climbing 3-5 steps with a railing? : Total 6 Click Score: 10    End of Session Equipment Utilized During Treatment: Gait belt Activity Tolerance: Patient tolerated treatment well Patient left: in chair;with call bell/phone within reach;with chair alarm set Nurse Communication: Mobility status PT Visit Diagnosis: Hemiplegia and hemiparesis;Unsteadiness on feet (R26.81);Difficulty in walking, not elsewhere classified (R26.2) Hemiplegia - Right/Left: Left Hemiplegia - dominant/non-dominant: Non-dominant Hemiplegia - caused by: Nontraumatic intracerebral hemorrhage     Time: 7939-0300 PT Time Calculation (min) (ACUTE ONLY): 44 min  Charges:  $Therapeutic Activity: 8-22 mins  Wyona Almas, PT, DPT Acute Rehabilitation Services Pager [phone] Office [phone]    Deno Etienne 10/16/2020, 4:09 PM

## 2020-10-16 NOTE — TOC CAGE-AID Note (Signed)
Transition of Care [facility]) - CAGE-AID Screening   Patient Details  Name: Lance [name] MRN: [id number] Date of Birth: January 28, 1946  [facility]) CM/SW Contact:    Benard Halsted, [place] Phone Number: 10/16/2020, 11:57 AM   Clinical Narrative: Patient still cognitively impaired from baseline and unable to participate in screening at this time.    CAGE-AID Screening: Substance Abuse Screening unable to be completed due to: : Patient unable to participate

## 2020-10-16 NOTE — Progress Notes (Signed)
CBG 272 for 1200.  Notified Bailey-Modzik, Delila A, NP and verbal order given to administer sliding scale insulin as ordered.

## 2020-10-16 NOTE — Evaluation (Signed)
Occupational Therapy Evaluation Patient Details Name: Lance [name] MRN: [id number] DOB: 14-Feb-1946 Today's Date: 10/16/2020    History of Present Illness Pt is a 75 y.o. M who presents to ER after 2 weeks of weakness and difficulty walking. Pt positive for cocaine. CT head showing right frontal intraparenchymal hemorrhage. MRI 5/8 showing stable intra axial hemorrhage in R superior frontal gyrus and stable right anterior frontal convexity en plaque meningioma. Significant PMH: diabetes mellitus, hepatitis C, schizophrenia.   Clinical Impression   Pt PTA: Pt living alone and poor historian for PLOF. Pt currently, limited by L inattention, blurriness of vision, decreased strength on L side, decreased activity tolerance and decreased ability to care for self. Pt R handed and using RUE for all grooming tasks at EOB with assist due to heavy L sided lean. Pt maxA +2 for sit to stands - near totalA+2 but R side assisting a little, but unable to take any steps; use of stedy to transfer to recliner. Pt set-upA to [name] for ADL. Pt with cognitive deficits, but following 1 step commands with focused attention intermittently. BPs: 148/89 at rest; 143/94 in supine (legs uncrossed), O2 >90%, HR 79 BPM.  Okayed by RN for permissible HTN below 150. Pt would benefit from continued OT skilled services. OT following acutely.    Follow Up Recommendations  CIR    Equipment Recommendations  3 in 1 bedside commode;Wheelchair (measurements OT);Wheelchair cushion (measurements OT);Hospital bed    Recommendations for Other Services Rehab consult     Precautions / Restrictions Precautions Precautions: Fall;Other (comment) Precaution Comments: L hemiplegia, BP < 160 Restrictions Weight Bearing Restrictions: No      Mobility Bed Mobility Overal bed mobility: Needs Assistance Bed Mobility: Supine to Sit;Sit to Supine     Supine to sit: Max assist;+2 for physical assistance;+2 for safety/equipment      General bed mobility comments: Pt with difficulty sequencing through moving to R side of bed; assist with rolling to R and trunk elevation with helicopter method to EOB. Pt requiring assist for static sitting at EOB due to L lateral lean    Transfers Overall transfer level: Needs assistance Equipment used: None Transfers: Sit to/from Stand Sit to Stand: Max assist;+2 physical assistance;+2 safety/equipment         General transfer comment: maxA +2 for knee blocking for standing x2 times and unable to take steps; LLE not bearing any weight. Pt usign stedy for transfer to recliner with maxA+2 for sit to stand from stedy with heavy L lateral lean.    Balance Overall balance assessment: Needs assistance Sitting-balance support: Feet supported Sitting balance-Leahy Scale: Poor Sitting balance - Comments: supervisionA to maxA for static sitting with L lateral laen Postural control: Left lateral lean Standing balance support: Bilateral upper extremity supported Standing balance-Leahy Scale: Poor Standing balance comment: standing x2 times with L lateral lean and unltimately used stedy maxA+2 for sit to stand to pivot to recliner                           ADL either performed or assessed with clinical judgement   ADL Overall ADL's : Needs assistance/impaired Eating/Feeding: Set up;Sitting   Grooming: Minimal assistance;Sitting Grooming Details (indicate cue type and reason): supported sitting using RUE Upper Body Bathing: Moderate assistance;Sitting   Lower Body Bathing: Maximal assistance;Sitting/lateral leans;Sit to/from stand   Upper Body Dressing : Moderate assistance;Sitting   Lower Body Dressing: Maximal assistance;Sitting/lateral leans;Sit to/from stand;+2 for safety/equipment;+2 for  physical assistance   Toilet Transfer: Maximal assistance;+2 for physical assistance;+2 for safety/equipment Toilet Transfer Details (indicate cue type and reason): use of stedy  maxA+2 Toileting- Clothing Manipulation and Hygiene: Total assistance       Functional mobility during ADLs: Maximal assistance;+2 for physical assistance;+2 for safety/equipment;Cueing for safety;Cueing for sequencing General ADL Comments: Pt limited by L inattention, blurriness of vision, decreased strength on L side, decreased activity tolerance and decreased ability to care for self. Pt R handed and using RUE for all grooming tasks at EOB with assist due to heavy L sided lean.     Vision Baseline Vision/History: Wears glasses Wears Glasses: At all times Patient Visual Report: Blurring of vision (reports "I need my glasses.") Vision Assessment?: Vision impaired- to be further tested in functional context;Yes Eye Alignment: Within Functional Limits Ocular Range of Motion: Within Functional Limits Alignment/Gaze Preference: Gaze right Tracking/Visual Pursuits: Impaired - to be further tested in functional context Additional Comments: scanning environmen, but L inattention noted     Perception     Praxis      Pertinent Vitals/Pain Pain Assessment: Faces Faces Pain Scale: Hurts even more Pain Location: head, L neck Pain Descriptors / Indicators: Discomfort;Grimacing;Guarding Pain Intervention(s): Monitored during session;Repositioned;Limited activity within patient's tolerance     Hand Dominance Right   Extremity/Trunk Assessment Upper Extremity Assessment Upper Extremity Assessment: Generalized weakness LUE Deficits / Details: Weak hand grip, elbow flexion/extension 2/5, shoulder flexion 0/5 LUE Sensation: WNL   Lower Extremity Assessment Lower Extremity Assessment: Defer to PT evaluation;LLE deficits/detail LLE Deficits / Details: No active movement noted   Cervical / Trunk Assessment Cervical / Trunk Assessment: Normal   Communication Communication Communication: No difficulties   Cognition Arousal/Alertness: Awake/alert Behavior During Therapy: Flat  affect Overall Cognitive Status: Impaired/Different from baseline Area of Impairment: Orientation;Attention;Memory;Following commands;Safety/judgement;Awareness;Problem solving                 Orientation Level: Disoriented to;Place;Time;Situation Current Attention Level: Focused Memory: Decreased short-term memory Following Commands: Follows one step commands inconsistently;Follows one step commands with increased time Safety/Judgement: Decreased awareness of deficits;Decreased awareness of safety Awareness: Emergent Problem Solving: Slow processing;Difficulty sequencing;Requires verbal cues;Requires tactile cues General Comments: Pt oriented to self and hospital, able to state month with choices and year after given answer. Pt able to recall year after 10 mins. Pt perform Short Blessed Test, but was unable to attempt counting 20 to 1 and naming the months of the year in reverse order  scoring  >10 consistent with dementia, but would require cognitive evaluation for further information. Pt following 1 step commands with moderate cues to attend to task as pt with poor ability to sequence through with focused attention and emergent awareness. Pt stating "something ain't right on my left side."   General Comments  BPs: 148/89 at rest; 143/94 in supine (legs uncrossed), O2 >90%, HR 79 BPM.  Okayed by RN for permissible HTN <150. Pillow places at L side to support and reduce lean on L side.    Exercises     Shoulder Instructions      Home Living Family/patient expects to be discharged to:: Private residence Living Arrangements: Alone Available Help at Discharge: Family;Available PRN/intermittently Type of Home: House Home Access: Stairs to enter [employer] of Steps: 3 Entrance Stairs-Rails: Right Home Layout: One level         Bathroom Toilet: Standard     Home Equipment: Walker - 4 wheels;Cane - single point  Prior Functioning/Environment Level of  Independence: Needs assistance  Gait / Transfers Assistance Needed: Using Rollator in past couple weeks when ambulatory dysfunction occurred ADL's / Homemaking Assistance Needed: Pt family drives him to grocery shopping, appointment   Comments: Poor historian        OT Problem List: Decreased strength;Decreased activity tolerance;Decreased safety awareness;Pain;Increased edema;Decreased coordination;Impaired balance (sitting and/or standing);Cardiopulmonary status limiting activity;Impaired UE functional use;Decreased range of motion;Decreased cognition;Decreased knowledge of use of DME or AE      OT Treatment/Interventions: Self-care/ADL training;Therapeutic exercise;Neuromuscular education;Energy conservation;Therapeutic activities;Cognitive remediation/compensation;Patient/family education;Balance training;Visual/perceptual remediation/compensation;DME and/or AE instruction    OT Goals(Current goals can be found in the care plan section) Acute Rehab OT Goals Patient Stated Goal: to eat OT Goal Formulation: With patient Time For Goal Achievement: 10/30/20 Potential to Achieve Goals: Good ADL Goals Pt Will Perform Grooming: with set-up;sitting Pt Will Perform Lower Body Dressing: sit to/from stand;with min assist;sitting/lateral leans Pt Will Transfer to Toilet: with min assist;stand pivot transfer;bedside commode Pt/caregiver will Perform Home Exercise Program: Increased strength;Left upper extremity;With theraband;With minimal assist;With written HEP provided Additional ADL Goal #1: Pt will sequence through x5 mins of ADL tasks with minimal cues in a minimal distracted envirnment. Additional ADL Goal #2: Pt will perform higher level cognitive tasks with minimal cues to attend to task in order to continue to assess safety and cognition.  OT Frequency: Min 2X/week   Barriers to D/C:            Co-evaluation PT/OT/SLP Co-Evaluation/Treatment: Yes Reason for Co-Treatment: Complexity  of the patient's impairments (multi-system involvement);For patient/therapist safety;To address functional/ADL transfers;Necessary to address cognition/behavior during functional activity   OT goals addressed during session: ADL's and self-care      AM-PAC OT "6 Clicks" Daily Activity     Outcome Measure Help from another person eating meals?: A Little Help from another person taking care of personal grooming?: A Little Help from another person toileting, which includes using toliet, bedpan, or urinal?: Total Help from another person bathing (including washing, rinsing, drying)?: A Lot Help from another person to put on and taking off regular upper body clothing?: A Lot Help from another person to put on and taking off regular lower body clothing?: A Lot 6 Click Score: 13   End of Session Equipment Utilized During Treatment: Gait belt Nurse Communication: Mobility status  Activity Tolerance: Patient tolerated treatment well Patient left: in chair;with call bell/phone within reach;with chair alarm set  OT Visit Diagnosis: Unsteadiness on feet (R26.81);Muscle weakness (generalized) (M62.81);Other symptoms and signs involving cognitive function                Time: 1144-1230 OT Time Calculation (min): 46 min Charges:  OT General Charges $OT Visit: 1 Visit OT Evaluation $OT Eval Moderate Complexity: 1 Mod OT Treatments $Self Care/Home Management : 8-22 mins  Jefferey Pica, OTR/L Acute Rehabilitation Services Pager: [phone] Office: [place] 10/16/2020, 2:03 PM

## 2020-10-16 NOTE — Progress Notes (Signed)
Inpatient Diabetes Program Recommendations  AACE/ADA: New Consensus Statement on Inpatient Glycemic Control (2015)  Target Ranges:  Prepandial:   less than 140 mg/dL      Peak postprandial:   less than 180 mg/dL (1-2 hours)      Critically ill patients:  140 - 180 mg/dL   Lab Results  Component Value Date   GLUCAP 272 (H) 10/16/2020   HGBA1C 10.1 (H) 10/15/2020    Review of Glycemic Control  Diabetes history: DM2 Outpatient Diabetes medications: Levemir 18 units qd + Novolog 4 units tid meal coverage  Current orders for Inpatient glycemic control: Lantus 18 units qd + Novolog 0-15 units tid + hs 0-5 units  Inpatient Diabetes Program Recommendations:   Noted postprandial CBGs elevated -Add Novolog 3-4 units tid meal coverage if eats 50% Secure chat sent to NP Bailey[place]Modz[place].   Thank you, Nani Gasser. Teona Vargus, RN, MSN, CDE  Diabetes Coordinator Inpatient Glycemic Control Team Team Pager [phone] (8am-5pm) 10/16/2020 12:57 PM

## 2020-10-16 NOTE — Progress Notes (Signed)
STROKE TEAM PROGRESS NOTE   HISTORY OF PRESENT ILLNESS (per record) Lance [name] is a 75 y.o. male past medical history of hepatitis C, diabetes, schizophrenia presented to ER for 2 weeks of weakness and difficulty walking. He reports the weakness is generalized but more so in his left leg and having difficulty walking. He was seen in December 2021 after he was being found down and was noted to have rapid shifts in his mentations. He was positive for cocaine at that time and also admits to doing cocaine 2 days ago this time. He was extremely hypertensive at [facility] emergency room. CT head in the emergency room at [facility] was done that showed a Right frontal intraparenchymal hemorrhage.  Started on Cleviprex drip and transferred ED to ED for ICU admission at [facility]. Prior MRIs were suggestive of chronic microangiopathy and mild hemorrhages concerning for amyloid angiopathy.  Also had a right frontal meningioma, stable on today's exam. He continues to abuse cocaine. Denies any headache at this time Reports incoordination, weakness and difficulty walking as his main concerns at this time. LKW: 2 weeks ago tpa given?: no, ICH Premorbid modified Rankin scale (mRS): 0 ICH score-0   INTERVAL HISTORY No acute events. He reports he has been told he has high BP in the past but has not been taking medications. He is vague regarding this history. He is agreeable to starting Norvasc. Today he reports no changes or new concerns.  Neurological exam is stable.  Blood pressure seems adequately controlled today  OBJECTIVE Vitals:   10/16/20 0900 10/16/20 1000 10/16/20 1100 10/16/20 1200  BP: (!) 138/94 (!) 145/94 128/83 (!) 153/97  Pulse: 72 72 74 79  Resp: 15 (!) 22 16 (!) 24  Temp:    97.6 F (36.4 C)  TempSrc:    Axillary  SpO2: 98% 99% 96% 98%  Weight:      Height:       CBC:  Recent Labs  Lab 10/14/20 1922 10/15/20 0234 10/16/20 0423  WBC  11.1* 13.0* 10.8*  NEUTROABS 8.1*  --   --   HGB 13.2 14.5 14.1  HCT 39.4 43.5 43.4  MCV 90.6 91.4 93.5  PLT 170 250 952    Basic Metabolic Panel:  Recent Labs  Lab 10/15/20 0234 10/16/20 0423  NA 135 136  K 3.6 3.9  CL 101 103  CO2 27 24  GLUCOSE 342* 201*  BUN 13 15  CREATININE 0.86 0.83  CALCIUM 8.9 8.6*    Lipid Panel:     Component Value Date/Time   CHOL 159 10/15/2020 0234   TRIG 369 (H) 10/15/2020 0234   HDL 26 (L) 10/15/2020 0234   CHOLHDL 6.1 10/15/2020 0234   VLDL 74 (H) 10/15/2020 0234   LDLCALC 59 10/15/2020 0234   HgbA1c:  Lab Results  Component Value Date   HGBA1C 10.1 (H) 10/15/2020   Urine Drug Screen:     Component Value Date/Time   LABOPIA NONE DETECTED 10/14/2020 2000   COCAINSCRNUR POSITIVE (A) 10/14/2020 2000   LABBENZ NONE DETECTED 10/14/2020 2000   AMPHETMU NONE DETECTED 10/14/2020 2000   THCU NONE DETECTED 10/14/2020 2000   LABBARB NONE DETECTED 10/14/2020 2000    Alcohol Level     Component Value Date/Time   ETH <10 10/14/2020 1922    IMAGING  CT Head Wo Contrast 10/14/2020 IMPRESSION:  1. 3.4 cm x 1.7 cm x 2.8 cm acute right frontoparietal parenchymal bleed, as described above. MRI correlation  is recommended.  2. Stable right anterior cranial fossa meningioma.   MRI BRAIN W & WO CONTRAST -  IMPRESSION: 1. Stable intra-axial hemorrhage in the right superior frontal gyrus/motor strip since yesterday, estimated volume 15 mL. 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 Mild surrounding edema. No significant mass effect or other complicating features.  2. Stable right anterior frontal convexity en plaque Meningioma since December. Anterior segment of the superior sagittal sinus occluded as before but the other dural venous sinuses remain patent.  3. Small bilateral subdural hematomas have resolved since December.  DG Chest 2 View 10/14/2020 IMPRESSION: Stable exam without  active cardiopulmonary disease.   CT Cervical Spine Wo Contrast 10/14/2020 IMPRESSION:  1. No evidence of acute cervical spine fracture or subluxation.  2. Marked severity multilevel degenerative changes.   Transthoracic Echocardiogram  10/14/2020 Left Ventricle: Left ventricular ejection fraction, by estimation, is 70  to 75%. The left ventricle has hyperdynamic function. The left ventricle has no regional wall motion abnormalities. The left ventricular internal cavity size was normal in size.  There is mild concentric left ventricular hypertrophy. Left ventricular diastolic parameters are consistent with Grade I diastolic dysfunction  (impaired relaxation).  Right Ventricle: The right ventricular size is normal. No increase in  right ventricular wall thickness. Right ventricular systolic function is  normal.  Left Atrium: Left atrial size was normal in size.  Right Atrium: Right atrial size was normal in size. Prominent Eustachian valve.  Pericardium: There is no evidence of pericardial effusion.  Mitral Valve: The mitral valve is normal in structure. No evidence of  mitral valve regurgitation. No evidence of mitral valve stenosis.  Tricuspid Valve: The tricuspid valve is normal in structure. Tricuspid valve regurgitation is not demonstrated. No evidence of tricuspid stenosis.   Aortic Valve: The aortic valve is normal in structure. Aortic valve  regurgitation is not visualized. No aortic stenosis is present.   Pulmonic Valve: The pulmonic valve was normal in structure. Pulmonic valve regurgitation is not visualized. No evidence of pulmonic stenosis.   Aorta: The aortic root is normal in size and structure.   Venous: The inferior vena cava is normal in size with greater than 50% respiratory variability, suggesting right atrial pressure of 3 mmHg.   IAS/Shunts: No atrial level shunt detected by color flow Doppler.   PHYSICAL EXAM Blood pressure (!) 153/97, pulse 79, temperature 97.6  F (36.4 C), temperature source Axillary, resp. rate (!) 24, height 5\' 9"  (1.753 m), weight 77 kg, SpO2 98 %.   GENERAL: 74 y.o.male sitting up in bed in NAD.   HEENT: Normal  ABDOMEN: soft  EXTREMITIES: No edema   BACK: Normal  SKIN: Normal by inspection.    MENTAL STATUS: He is awake and alert.  He does have a moderate dysarthria.     CRANIAL NERVES: Pupils are equal, round and reactive to light and accomodation; extra ocular movements are full, there is no significant nystagmus; visual fields are full; there is flattening of the nasolabial fold - L; tongue is midline  MOTOR: Left upper extremity 2/5 in the left leg 1/5.  Right side 5/5.  COORDINATION: No dysmetria noted on the right side.  ASSESSMENT/PLAN Lance [name] is a 75 y.o. male with history of hepatitis C, diabetes, schizophrenia, tobacco use, ongoing cocaine abuse, possible amyloid angiopathy, and hx of being found down in December 2021 with rapid shifts in his mentation. The pt presented to the ER at [facility] for 2 weeks of generalized weakness, LLE weakness, difficulty walking, and incoordination. He was found to be extremely hypertensive -> treated with cleviprex and tx'd to [facility] ED after a CT Head showed a right frontal intraparenchymal hemorrhage. He did not receive tPA due to [place].  Right frontal intraparenchymal hemorrhage likely due to severe hypertension secondary to cocaine use and amyloid angiopathy.   Resultant left hemiparesis and dysarthria  Code Stroke CT Head - not ordered  CT head - 3.4 cm x 1.7 cm x 2.8 cm acute right frontoparietal parenchymal bleed  MRI head W&WO - Stable hemorrhage  2D Echo -EF 70%, No thrombus, wall motion abnormality or shunt found.   LDL - 59  HgbA1c - 10.1  UDS - positive for cocaine  VTE prophylaxis - SCDs Diet  Diet Order            Diet Carb Modified Fluid consistency: Thin; Room service appropriate? Yes  Diet effective now                  No antithrombotic/antiplatelet prior to admission.   No anticoagulant or antiplatelets   Ongoing aggressive stroke risk factor management  Transfer to floor with hospitalist team to assume care tomorrow per protocol  Therapy recommendations:  CIR  Disposition:  Pending  Hypertension  Home BP meds: none, uncontrolled, unclear history  Current BP meds: Off Cleviprex. Norvasc 5mg  daily started. Continue normodyne prn  Stable . SBP goal < 140 mm Hg for the first 24 hours then < 160 mm Hg. . 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   Known Meningioma  Stable on imaging  Other Stroke Risk Factors  Advanced age  Cigarette smoker - advised to stop smoking  Previous ETOH use  Substance abuse  Other Active Problems, Findings, Recommendations and/or Plan  Code status - Full Code  Hepatitis C  Schizophrenia hx - Zyprexa pta  Leukocytosis - WBC's - 11.1->13.0->10.8   Hospital day # 2 Continue mobilization out of bed.  Therapy consults.  Start Norvasc 5 mg daily for blood pressure.  Consult medical hospitalist team for transfer of care.  This patient is critically ill and at significant risk of neurological worsening, death and care requires constant monitoring of vital signs, hemodynamics,respiratory and cardiac monitoring, extensive review of multiple databases, frequent neurological assessment, discussion with  family, other specialists and medical decision making of high complexity.I have made any additions or clarifications directly to the above note.This critical care time does not reflect procedure time, or teaching time or supervisory time of PA/NP/Med Resident etc but could involve care discussion time.  I spent 30 minutes of neurocritical care time  in the care of  this patient. Antony Contras, MD     To contact Stroke Continuity provider, please refer to [email or web address] After hours, contact General Neurology

## 2020-10-17 DIAGNOSIS — I10 Essential (primary) hypertension: Secondary | ICD-10-CM

## 2020-10-17 LAB — CBC
HCT: 42.3 % (ref 39.0–52.0)
Hemoglobin: 13.6 g/dL (ref 13.0–17.0)
MCH: 30.2 pg (ref 26.0–34.0)
MCHC: 32.2 g/dL (ref 30.0–36.0)
MCV: 93.8 fL (ref 80.0–100.0)
Platelets: 198 10*3/uL (ref 150–400)
RBC: 4.51 MIL/uL (ref 4.22–5.81)
RDW: 12.9 % (ref 11.5–15.5)
WBC: 9.4 10*3/uL (ref 4.0–10.5)
nRBC: 0 % (ref 0.0–0.2)

## 2020-10-17 LAB — BASIC METABOLIC PANEL
Anion gap: 6 (ref 5–15)
BUN: 17 mg/dL (ref 8–23)
CO2: 26 mmol/L (ref 22–32)
Calcium: 8.6 mg/dL — ABNORMAL LOW (ref 8.9–10.3)
Chloride: 104 mmol/L (ref 98–111)
Creatinine, Ser: 0.87 mg/dL (ref 0.61–1.24)
GFR, Estimated: >60 mL/min (ref >60)
Glucose, Bld: 194 mg/dL — ABNORMAL HIGH (ref 70–99)
Potassium: 3.9 mmol/L (ref 3.5–5.1)
Sodium: 136 mmol/L (ref 135–145)

## 2020-10-17 LAB — GLUCOSE, CAPILLARY
Glucose-Capillary: 215 mg/dL — ABNORMAL HIGH (ref 70–99)
Glucose-Capillary: 283 mg/dL — ABNORMAL HIGH (ref 70–99)
Glucose-Capillary: 230 mg/dL — ABNORMAL HIGH (ref 70–99)
Glucose-Capillary: 196 mg/dL — ABNORMAL HIGH (ref 70–99)

## 2020-10-17 MED ORDER — INSULIN ASPART 100 UNIT/ML IJ SOLN
7.0000 [IU] | Freq: Three times a day (TID) | INTRAMUSCULAR | Status: DC
  Administered 2020-10-17 – 2020-10-19 (×5): 7 [IU] via SUBCUTANEOUS

## 2020-10-17 MED ORDER — LABETALOL HCL 5 MG/ML IV SOLN
10.0000 mg | INTRAVENOUS | Status: DC | PRN
  Administered 2020-10-17: 10 mg via INTRAVENOUS
  Filled 2020-10-17: qty 4

## 2020-10-17 NOTE — Evaluation (Signed)
Speech Language Pathology Evaluation Patient Details Name: Lance [name] MRN: [id number] DOB: 11/21/1945 Today's Date: 10/17/2020 Time: 8588-5027 SLP Time Calculation (min) (ACUTE ONLY): 19 min  Problem List:  Patient Active Problem List   Diagnosis Date Noted  . ICH (intracerebral hemorrhage) ([place]) 10/14/2020  . Weakness   . Acute metabolic encephalopathy   . Silent micro-hemorrhage of brain ([place])   . Functional assessment declined   . Malnutrition of moderate degree 05/26/2020  . Subacute delirium 05/16/2020  . Syncope 05/15/2020  . Sepsis ([place]) 05/14/2020  . Leg pain 12/07/2018  . PAD (peripheral artery disease) ([place]) 12/07/2018  . Acute upper GI bleed 10/10/2016  . Hypotension   . Polysubstance abuse ([place]) 03/10/2015  . Mass of arm 03/10/2015  . 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 03/10/2015  . Perianal abscess 08/31/2014  . 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 ([place]) 07/07/2014  . Adenomatous polyp of colon 08/30/2013  . Type 2 diabetes mellitus with hyperglycemia, without long-term current use of insulin ([place]) 08/26/2013  . Hepatitis C virus infection without hepatic coma 03/21/2013  . Need for hepatitis B vaccination 03/21/2013  . Depression 10/23/2012  . IVDU (intravenous drug user) 10/23/2012  . Schizophrenia ([place]) 10/23/2012  . Carpal tunnel syndrome on both sides 07/31/2012  . Diabetic peripheral neuropathy ([place]) 07/31/2012  . Osteoarthritis of both hips 07/31/2012  . Esophageal reflux 10/21/2009  . Diverticulitis of colon 10/21/2009   Past Medical History:  Past Medical History:  Diagnosis Date  . Diabetes mellitus without complication ([place])   . Hepatitis C   . Schizophrenia [facility])    Past Surgical History:  Past Surgical History:  Procedure Laterality Date  . COLONOSCOPY WITH PROPOFOL N/A 08/15/2020   Procedure: COLONOSCOPY WITH PROPOFOL;  Surgeon: Lucilla Lame, MD;  Location: [facility] ENDOSCOPY;  Service: Endoscopy;  Laterality: N/A;  . ESOPHAGOGASTRODUODENOSCOPY (EGD) WITH  PROPOFOL N/A 10/10/2016   Procedure: ESOPHAGOGASTRODUODENOSCOPY (EGD) WITH PROPOFOL;  Surgeon: Lucilla Lame, MD;  Location: [facility] ENDOSCOPY;  Service: Endoscopy;  Laterality: N/A;  . HERNIA REPAIR    . VISCERAL ARTERY INTERVENTION N/A 10/11/2016   Procedure: Visceral Artery Intervention;  Surgeon: Algernon Huxley, MD;  Location: [facility];  Service: Cardiovascular;  Laterality: N/A;   HPI:  Pt is a 75 y.o. M who presents to ER after 2 weeks of weakness and difficulty walking. Pt positive for cocaine. CT head showing right frontal intraparenchymal hemorrhage. MRI 5/8 showing stable intra axial hemorrhage in R superior frontal gyrus and stable right anterior frontal convexity en plaque meningioma. Significant PMH: diabetes mellitus, hepatitis C, schizophrenia   Assessment / Plan / Recommendation Clinical Impression  Lance [name]'s speech intelligibility is appropriate without noteable distortions or oral-motor weakness. He is retired, completing high school and trade school and had "different jobs". Pt appeared hesitant initially but became more engaged as evaluation progressed during partial administration of SLUMS. Areas of growth are storage and retrieval of words. thought organization, information processing and problem solving. He reported performing lower than he would have prior to stroke. Further intervention recommended on acute and agree with CIR recommendation.    SLP Assessment  SLP Recommendation/Assessment: Patient needs continued Speech Lanaguage Pathology Services SLP Visit Diagnosis: Cognitive communication deficit (R41.841)    Follow Up Recommendations  Inpatient Rehab    Frequency and Duration min 2x/week  2 weeks      SLP Evaluation Cognition  Overall Cognitive Status: Impaired/Different from baseline Arousal/Alertness: Awake/alert Orientation Level: Oriented to person;Disoriented to situation;Oriented to place Attention: Sustained Sustained Attention:  Impaired Memory: Impaired Memory Impairment: Retrieval deficit;Storage deficit Awareness: Impaired Awareness Impairment: Emergent impairment;Anticipatory impairment Problem Solving: Impaired Problem Solving Impairment: Verbal basic Safety/Judgment: Impaired       Comprehension  Auditory Comprehension Overall Auditory Comprehension: Impaired Conversation: Other (comment) (difficulty comprehending more abstract info) Interfering Components: Processing speed EffectiveTechniques: Extra processing time Visual Recognition/Discrimination Discrimination: Not tested Reading Comprehension Reading Status: Not tested    Expression Expression Primary Mode of Expression: Verbal Verbal Expression Overall Verbal Expression: Appears within functional limits for tasks assessed Pragmatics: No impairment Written Expression Dominant Hand: Right Written Expression: Not tested   Oral / Motor  Oral Motor/Sensory Function Overall Oral Motor/Sensory Function: Within functional limits Motor Speech Overall Motor Speech: Appears within functional limits for tasks assessed Articulation: Within functional limitis Intelligibility: Intelligible Motor Planning: Witnin functional limits   GO                    Houston Siren 10/17/2020, 3:19 PM Orbie Pyo Zenovia Justman M.Ed Risk analyst [phone] Office [phone]

## 2020-10-17 NOTE — Progress Notes (Signed)
Pt admitted to 3W AxOx4, VS wnL and as per flow. NSR on the monitor. Pt oriented to 3W processes. All questions and concerns addressed. Call bell placed within reach, will continue to monitor and maintain safety.

## 2020-10-17 NOTE — H&P (Signed)
Physical Medicine and Rehabilitation Admission H&P    Chief Complaint  Patient presents with  . Intraparenchymal hemorrhage  : HPI: Lance [name] is a 75 year old right-handed male with history of diabetes mellitus, hepatitis C and schizophrenia as well as alcohol and polysubstance use.  Per chart review patient lives alone.  1 level home 3 steps to entry.  Used a Rollator for the past few weeks due to gait dysfunction.  Presented 10/14/2020 with weakness x2 weeks especially left lower extremity with gait disorder.  CT of the head showed a 3.4 x 1.7 x 2.8 acute right frontal parietal parenchymal bleed.  CT cervical spine negative.  MRI follow-up showed stable intra-axial hemorrhage in the right superior frontal gyrus motor strip.  Stable right anterior frontal convexity en plaque meningioma since December with comparison of prior imaging.  Small bilateral subdural hematomas resolved since earlier tracings of December.  Prior MRIs were also suggestive of chronic microangiopathy and mild hemorrhages concerning for possible amyloid angiopathy.  Echocardiogram with ejection fraction of 70 to 40% grade 1 diastolic dysfunction.  Admission chemistries unremarkable except glucose 167 alcohol negative urine drug screen positive cocaine.  Neurology follow-up conservative care close monitoring of blood pressure.  Tolerating a regular consistency diet.  Due to patient's decreased functional ability was admitted for a comprehensive rehab program.  Pt reports neck and back pain- new pain and really painful last 2-3 days.  Tylenol does nothing.  LBM 2 days ok- no constipation Sx's. Voiding OK with condom catheter.    Review of Systems  Constitutional: Negative for chills and fever.  HENT: Negative for hearing loss.   Eyes: Negative for blurred vision and double vision.  Respiratory: Negative for cough and shortness of breath.   Cardiovascular: Negative for chest pain and leg swelling.  Gastrointestinal:  Positive for constipation. Negative for heartburn, nausea and vomiting.  Genitourinary: Negative for dysuria, flank pain and hematuria.  Musculoskeletal: Positive for falls and myalgias.  Skin: Negative for rash.  Psychiatric/Behavioral:       Schizophrenia  All other systems reviewed and are negative.  Past Medical History:  Diagnosis Date  . Diabetes mellitus without complication ([place])   . Hepatitis C   . Schizophrenia [facility])    Past Surgical History:  Procedure Laterality Date  . COLONOSCOPY WITH PROPOFOL N/A 08/15/2020   Procedure: COLONOSCOPY WITH PROPOFOL;  Surgeon: Lucilla Lame, MD;  Location: [facility] ENDOSCOPY;  Service: Endoscopy;  Laterality: N/A;  . ESOPHAGOGASTRODUODENOSCOPY (EGD) WITH PROPOFOL N/A 10/10/2016   Procedure: ESOPHAGOGASTRODUODENOSCOPY (EGD) WITH PROPOFOL;  Surgeon: Lucilla Lame, MD;  Location: [facility] ENDOSCOPY;  Service: Endoscopy;  Laterality: N/A;  . HERNIA REPAIR    . VISCERAL ARTERY INTERVENTION N/A 10/11/2016   Procedure: Visceral Artery Intervention;  Surgeon: Algernon Huxley, MD;  Location: [place] CV LAB;  Service: Cardiovascular;  Laterality: N/A;   Family History  Problem Relation Age of Onset  . Diabetes Sister    Social History:  reports that he has been smoking. He has a 2.50 pack-year smoking history. He has never used smokeless tobacco. He reports previous alcohol use of about 10.0 standard drinks of alcohol per week. He reports that he does not use drugs. Allergies: No Known Allergies Medications Prior to Admission  Medication Sig Dispense Refill  . acetaminophen (TYLENOL) 650 MG CR tablet Take 650 mg by mouth every 8 (eight) hours as needed for pain.    Marland Kitchen albuterol (VENTOLIN HFA) 108 (90 Base) MCG/ACT inhaler Inhale 2 puffs into the  lungs every 6 (six) hours as needed for wheezing or shortness of breath. 18 g 0  . atorvastatin (LIPITOR) 20 MG tablet Take 20 mg by mouth daily.    . citalopram (CELEXA) 10 MG tablet Take 1 tablet (10 mg total) by mouth  daily. 30 tablet 0  . diclofenac Sodium (VOLTAREN) 1 % GEL Apply 2 g topically daily as needed (For pain).    . feeding supplement (ENSURE ENLIVE / ENSURE PLUS) LIQD Take 237 mLs by mouth 3 (three) times daily between meals. 21330 mL 0  . ferrous sulfate 325 (65 FE) MG tablet Take 1 tablet (325 mg total) by mouth daily with breakfast. 30 tablet 0  . gabapentin (NEURONTIN) 300 MG capsule Take 1 capsule by mouth 3 (three) times daily.    . insulin aspart (NOVOLOG) 100 UNIT/ML FlexPen 4 units subcutaneous injection with meals (okay to substitute any short acting insulin pen that is covered) (Patient taking differently: Inject 4 Units into the skin 3 (three) times daily with meals. 4 units subcutaneous injection with meals (okay to substitute any short acting insulin pen that is covered)) 15 mL 0  . insulin detemir (LEVEMIR) 100 UNIT/ML FlexPen Inject 18 Units into the skin at bedtime. 15 mL 1  . Insulin Pen Needle 34G X 3.5 MM MISC 1 Dose by Does not apply route 4 (four) times daily -  before meals and at bedtime. 200 each 0  . Multiple Vitamin (MULTI-VITAMIN) tablet Take 1 tablet by mouth daily.    Marland Kitchen OLANZapine (ZYPREXA) 5 MG tablet Take 5 mg by mouth daily.    . polyethylene glycol (MIRALAX / GLYCOLAX) 17 g packet Take 17 g by mouth daily as needed for mild constipation. 30 each 0  . polyvinyl alcohol (ARTIFICIAL TEARS) 1.4 % ophthalmic solution Place 1 drop into both eyes as needed for dry eyes. (Patient taking differently: Place 1 drop into both eyes daily as needed for dry eyes.) 15 mL 0  . sucralfate (CARAFATE) 1 g tablet Take 1 g by mouth daily.    . tamsulosin (FLOMAX) 0.4 MG CAPS capsule Take 2 capsules (0.8 mg total) by mouth daily. 60 capsule 0  . thiamine 100 MG tablet Take 1 tablet (100 mg total) by mouth daily. 30 tablet 0  . vitamin B-12 1000 MCG tablet Take 1 tablet (1,000 mcg total) by mouth daily. 30 tablet 0    Drug Regimen Review Drug regimen was reviewed and remains appropriate  with no significant issues identified  Home: Home Living Family/patient expects to be discharged to:: Private residence Living Arrangements: Alone Available Help at Discharge: Family,Available 24 hours/day Type of Home: House Home Access: Stairs to enter [employer] of Steps: 4 Entrance Stairs-Rails: Right,Left,Can reach both Home Layout: One level Bathroom Shower/Tub: Chiropodist: Standard Bathroom Accessibility: Yes Home Equipment: Walker - 4 wheels,Cane - single point   Functional History: Prior Function Level of Independence: Needs assistance Gait / Transfers Assistance Needed: Using Rollator in past couple weeks when ambulatory dysfunction occurred ADL's / Homemaking Assistance Needed: Pt family drives him to grocery shopping, appointment Comments: Poor historian  Functional Status:  Mobility: Bed Mobility Overal bed mobility: Needs Assistance Bed Mobility: Supine to Sit Supine to sit: Max assist,+2 for physical assistance Sit to supine: Max assist General bed mobility comments: Pt with difficulty sequencing through moving to R side of bed; assist with rolling to R and trunk elevation with helicopter method to EOB. Pt requiring assist for static sitting at EOB  due to L lateral lean Transfers Overall transfer level: Needs assistance Equipment used: None,Ambulation equipment used Transfers: Sit to/from Stand Sit to Stand: Max assist,+2 physical assistance,+2 safety/equipment General transfer comment: maxA +2 for knee blocking for standing x2 times and unable to take steps; LLE not bearing any weight. Pt usign stedy for transfer to recliner with maxA+2 for sit to stand from stedy with heavy L lateral lean.      ADL: ADL Overall ADL's : Needs assistance/impaired Eating/Feeding: Set up,Sitting Grooming: Minimal assistance,Sitting Grooming Details (indicate cue type and reason): supported sitting using RUE Upper Body Bathing: Moderate  assistance,Sitting Lower Body Bathing: Maximal assistance,Sitting/lateral leans,Sit to/from stand Upper Body Dressing : Moderate assistance,Sitting Lower Body Dressing: Maximal assistance,Sitting/lateral leans,Sit to/from stand,+2 for safety/equipment,+2 for physical assistance Toilet Transfer: Maximal assistance,+2 for physical assistance,+2 for safety/equipment Toilet Transfer Details (indicate cue type and reason): use of stedy maxA+2 Toileting- Clothing Manipulation and Hygiene: Total assistance Functional mobility during ADLs: Maximal assistance,+2 for physical assistance,+2 for safety/equipment,Cueing for safety,Cueing for sequencing General ADL Comments: Pt limited by L inattention, blurriness of vision, decreased strength on L side, decreased activity tolerance and decreased ability to care for self. Pt R handed and using RUE for all grooming tasks at EOB with assist due to heavy L sided lean.  Cognition: Cognition Overall Cognitive Status: Impaired/Different from baseline Orientation Level: Oriented to person,Oriented to place,Oriented to situation,Disoriented to time Cognition Arousal/Alertness: Awake/alert Behavior During Therapy: Flat affect Overall Cognitive Status: Impaired/Different from baseline Area of Impairment: Orientation,Attention,Memory,Following commands,Safety/judgement,Awareness,Problem solving Orientation Level: Disoriented to,Place,Time,Situation Current Attention Level: Focused Memory: Decreased short-term memory Following Commands: Follows one step commands inconsistently,Follows one step commands with increased time Safety/Judgement: Decreased awareness of deficits,Decreased awareness of safety Awareness: Emergent Problem Solving: Slow processing,Difficulty sequencing,Requires verbal cues,Requires tactile cues General Comments: Pt oriented to self and hospital, able to state month with choices and year after given answer. Pt able to recall year after 10 mins.  Pt perform Short Blessed Test, but was unable to attempt counting 20 to 1 and naming the months of the year in reverse order  scoring  >10 consistent with dementia, but would require cognitive evaluation for further information. Pt following 1 step commands with moderate cues to attend to task as pt with poor ability to sequence through with focused attention and emergent awareness. Pt stating "something ain't right on my left side."  Physical Exam: Blood pressure (!) 160/85, pulse 68, temperature 99.1 F (37.3 C), temperature source Oral, resp. rate 19, height 5\' 9"  (1.753 m), weight 77 kg, SpO2 100 %. Physical Exam Vitals and nursing note reviewed.  Constitutional:      Comments: Pt sitting up in bed- finished 100% lunch; spilled some on face/beard; staring straight ahead; RN in and out, NAD  HENT:     Head: Normocephalic and atraumatic.     Comments: L facial droop- mild Tongue midline    Right Ear: External ear normal.     Left Ear: External ear normal.     Nose: Nose normal. No congestion.     Mouth/Throat:     Mouth: Mucous membranes are moist.     Pharynx: Oropharynx is clear. No oropharyngeal exudate.  Eyes:     General:        Right eye: No discharge.        Left eye: No discharge.     Comments: R gaze preference; can look to left with continued direction- no nystagmus seen  Neck:     Comments: C/o  neck pain- tight scalenes and upper traps B/L Cardiovascular:     Rate and Rhythm: Normal rate and regular rhythm.     Heart sounds: Normal heart sounds. No murmur heard. No gallop.   Pulmonary:     Comments: CTA B/L- no W/R/R- good air movement Abdominal:     Comments: Soft, NT, ND, (+)BS    Genitourinary:    Comments: Condom catheter in place- light amber urine Musculoskeletal:     Comments: RUE 5/5 LUE- biceps ?1/5, Triceps 2/5, WE 4-/5, grip 3/5, and FA 2/5 RLE 5/5 LLE- 0/5 except 1/5 in PF Severely impacted by L inattention  Skin:    General: Skin is warm and dry.      Comments: No skin breakdown on backside Bunion on feet medially IV sites B/L- look OK. Didn't see actual IV, just where they were removed, per nurse No heel breakdown    Neurological:     Comments: Patient is alert no acute distress.  Dysarthric speech but intelligible.  Provides his name but not place or time.  Follows simple commands.     Results for orders placed or performed during the hospital encounter of 10/14/20 (from the past 48 hour(s))  Glucose, capillary     Status: Abnormal   Collection Time: 10/15/20 11:29 AM  Result Value Ref Range   Glucose-Capillary 480 (H) 70 - 99 mg/dL    Comment: Glucose reference range applies only to samples taken after fasting for at least 8 hours.  Glucose, capillary     Status: Abnormal   Collection Time: 10/15/20 12:38 PM  Result Value Ref Range   Glucose-Capillary 422 (H) 70 - 99 mg/dL    Comment: Glucose reference range applies only to samples taken after fasting for at least 8 hours.  Glucose, capillary     Status: Abnormal   Collection Time: 10/15/20  1:33 PM  Result Value Ref Range   Glucose-Capillary 254 (H) 70 - 99 mg/dL    Comment: Glucose reference range applies only to samples taken after fasting for at least 8 hours.  Glucose, capillary     Status: Abnormal   Collection Time: 10/15/20  2:34 PM  Result Value Ref Range   Glucose-Capillary 142 (H) 70 - 99 mg/dL    Comment: Glucose reference range applies only to samples taken after fasting for at least 8 hours.  Glucose, capillary     Status: None   Collection Time: 10/15/20  3:33 PM  Result Value Ref Range   Glucose-Capillary 81 70 - 99 mg/dL    Comment: Glucose reference range applies only to samples taken after fasting for at least 8 hours.  Glucose, capillary     Status: Abnormal   Collection Time: 10/15/20  4:25 PM  Result Value Ref Range   Glucose-Capillary 109 (H) 70 - 99 mg/dL    Comment: Glucose reference range applies only to samples taken after fasting for at  least 8 hours.  Glucose, capillary     Status: Abnormal   Collection Time: 10/15/20  6:02 PM  Result Value Ref Range   Glucose-Capillary 216 (H) 70 - 99 mg/dL    Comment: Glucose reference range applies only to samples taken after fasting for at least 8 hours.  Glucose, capillary     Status: Abnormal   Collection Time: 10/15/20  9:20 PM  Result Value Ref Range   Glucose-Capillary 186 (H) 70 - 99 mg/dL    Comment: Glucose reference range applies only to samples taken after fasting  for at least 8 hours.  CBC     Status: Abnormal   Collection Time: 10/16/20  4:23 AM  Result Value Ref Range   WBC 10.8 (H) 4.0 - 10.5 K/uL   RBC 4.64 4.22 - 5.81 MIL/uL   Hemoglobin 14.1 13.0 - 17.0 g/dL   HCT 43.4 39.0 - 52.0 %   MCV 93.5 80.0 - 100.0 fL   MCH 30.4 26.0 - 34.0 pg   MCHC 32.5 30.0 - 36.0 g/dL   RDW 13.0 11.5 - 15.5 %   Platelets 223 150 - 400 K/uL   nRBC 0.0 0.0 - 0.2 %    Comment: Performed at [facility], [place] [address] [place], [place] [address]  Basic metabolic panel     Status: Abnormal   Collection Time: 10/16/20  4:23 AM  Result Value Ref Range   Sodium 136 135 - 145 mmol/L   Potassium 3.9 3.5 - 5.1 mmol/L   Chloride 103 98 - 111 mmol/L   CO2 24 22 - 32 mmol/L   Glucose, Bld 201 (H) 70 - 99 mg/dL    Comment: Glucose reference range applies only to samples taken after fasting for at least 8 hours.   BUN 15 8 - 23 mg/dL   Creatinine, Ser 0.83 0.61 - 1.24 mg/dL   Calcium 8.6 (L) 8.9 - 10.3 mg/dL   GFR, Estimated >60 >60 mL/min    Comment: (NOTE) Calculated using the CKD-EPI Creatinine Equation (2021)    Anion gap 9 5 - 15    Comment: Performed at [facility] [address] [place], [place] [address]  Glucose, capillary     Status: Abnormal   Collection Time: 10/16/20  7:33 AM  Result Value Ref Range   Glucose-Capillary 222 (H) 70 - 99 mg/dL    Comment: Glucose reference range applies only to samples taken after fasting for at least 8 hours.  Glucose,  capillary     Status: Abnormal   Collection Time: 10/16/20 11:32 AM  Result Value Ref Range   Glucose-Capillary 272 (H) 70 - 99 mg/dL    Comment: Glucose reference range applies only to samples taken after fasting for at least 8 hours.  Glucose, capillary     Status: Abnormal   Collection Time: 10/16/20  5:14 PM  Result Value Ref Range   Glucose-Capillary 135 (H) 70 - 99 mg/dL    Comment: Glucose reference range applies only to samples taken after fasting for at least 8 hours.  Glucose, capillary     Status: Abnormal   Collection Time: 10/16/20  9:48 PM  Result Value Ref Range   Glucose-Capillary 347 (H) 70 - 99 mg/dL    Comment: Glucose reference range applies only to samples taken after fasting for at least 8 hours.  CBC     Status: None   Collection Time: 10/17/20  3:47 AM  Result Value Ref Range   WBC 9.4 4.0 - 10.5 K/uL   RBC 4.51 4.22 - 5.81 MIL/uL   Hemoglobin 13.6 13.0 - 17.0 g/dL   HCT 42.3 39.0 - 52.0 %   MCV 93.8 80.0 - 100.0 fL   MCH 30.2 26.0 - 34.0 pg   MCHC 32.2 30.0 - 36.0 g/dL   RDW 12.9 11.5 - 15.5 %   Platelets 198 150 - 400 K/uL   nRBC 0.0 0.0 - 0.2 %    Comment: Performed at [facility], [place] [address], [place], [place] [address]  Basic metabolic  panel     Status: Abnormal   Collection Time: 10/17/20  3:47 AM  Result Value Ref Range   Sodium 136 135 - 145 mmol/L   Potassium 3.9 3.5 - 5.1 mmol/L   Chloride 104 98 - 111 mmol/L   CO2 26 22 - 32 mmol/L   Glucose, Bld 194 (H) 70 - 99 mg/dL    Comment: Glucose reference range applies only to samples taken after fasting for at least 8 hours.   BUN 17 8 - 23 mg/dL   Creatinine, Ser 0.87 0.61 - 1.24 mg/dL   Calcium 8.6 (L) 8.9 - 10.3 mg/dL   GFR, Estimated >60 >60 mL/min    Comment: (NOTE) Calculated using the CKD-EPI Creatinine Equation (2021)    Anion gap 6 5 - 15    Comment: Performed at [facility] [address], [place], [place] [address]  Glucose, capillary     Status: Abnormal    Collection Time: 10/17/20  7:18 AM  Result Value Ref Range   Glucose-Capillary 215 (H) 70 - 99 mg/dL    Comment: Glucose reference range applies only to samples taken after fasting for at least 8 hours.   MR BRAIN W WO CONTRAST  Result Date: 10/15/2020 CLINICAL DATA:  75 year old male with superior right hemisphere intra-axial hemorrhage on presentation head CT for left leg weakness. Suspicion of amyloid angiopathy, and also demonstration of a 6 cm right anterior frontal convexity meningioma invading and occluding the anterior segment of the superior sagittal sinus on December MRI. EXAM: MRI HEAD WITHOUT AND WITH CONTRAST TECHNIQUE: Multiplanar, multiecho pulse sequences of the brain and surrounding structures were obtained without and with intravenous contrast. CONTRAST:  7.36mL GADAVIST GADOBUTROL 1 MMOL/ML IV SOLN COMPARISON:  Head and cervical spine CT 10/14/2020. Brain MRI 05/15/2020. FINDINGS: Brain: Right superior frontal gyrus motor strip intra-axial hemorrhage encompassing 41 by 22 x 33 mm (AP by transverse by CC), estimated volume 15 mL) with mostly hyperintense T2 and isointense T1 signal. Surrounding edema is contiguous with other chronic white matter T2 and FLAIR hyperintensity. There is only mild regional mass effect. Abnormal diffusion associated with the blood products, no larger area of restricted diffusion. No contrast enhancement. No diffusion restriction elsewhere in the brain. The previous small bilateral subdural hematomas seen in December appear resolved. Superimposed anterior right frontal convexity en plaque type meningioma appears stable and size and configuration since December, roughly 6.6 cm long axis, 1.9 cm thickness. Stable mass effect on the underlying brain with no convincing cerebral edema. Innumerable micro hemorrhages on SWI redemonstrated in addition to the acute parenchymal hemorrhage (series 14, image 43). Stable gray and white matter signal elsewhere. No ventriculomegaly  or intraventricular hemorrhage. Basilar cisterns remain normal. Negative pituitary and cervicomedullary junction. No other abnormal intracranial enhancement. Vascular: Major intracranial vascular flow voids are stable since December, including loss of the anterior but preserved posterior superior sagittal sinus dural venous flow void. And the other major dural venous sinuses are enhancing and appear to be patent following contrast today. Skull and upper cervical spine: Stable, negative for age. Sinuses/Orbits: Stable, negative. Other: Mastoids remain clear. Visible internal auditory structures appear normal. IMPRESSION: 1. Stable intra-axial hemorrhage in the right superior frontal gyrus/motor strip since yesterday, estimated volume 15 mL. 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 Mild surrounding edema. No significant mass effect or other complicating features. 2. Stable right anterior frontal convexity en plaque Meningioma since December. Anterior segment of the superior sagittal  sinus occluded as before but the other dural venous sinuses remain patent. 3. Small bilateral subdural hematomas have resolved since December. Electronically Signed   By: Genevie Ann M.D.   On: 10/15/2020 11:28   ECHOCARDIOGRAM COMPLETE  Result Date: 10/15/2020    ECHOCARDIOGRAM REPORT   Patient Name:   Lance [name] Date of Exam: 10/15/2020 Medical Rec #:  [id number]        Height:       69.0 in Accession #:    [id number]       Weight:       169.8 lb Date of Birth:  April 12, 1946        BSA:          1.927 m Patient Age:    67 years         BP:           122/77 mmHg Patient Gender: M                HR:           83 bpm. Exam Location:  Inpatient Procedure: 2D Echo Indications:    stroke  History:        Patient has prior history of Echocardiogram examinations, most                 recent 05/15/2020. Hepatitis C; Risk Factors:Diabetes and IV drug                 use.  Sonographer:    Johny Chess Referring PhysAQ:[id number] ASHISH ARORA IMPRESSIONS  1. Left ventricular ejection fraction, by estimation, is 70 to 75%. The left ventricle has hyperdynamic function. The left ventricle has no regional wall motion abnormalities. There is mild concentric left ventricular hypertrophy. Left ventricular diastolic parameters are consistent with Grade I diastolic dysfunction (impaired relaxation).  2. Right ventricular systolic function is normal. The right ventricular size is normal.  3. The mitral valve is normal in structure. No evidence of mitral valve regurgitation. No evidence of mitral stenosis.  4. The aortic valve is normal in structure. Aortic valve regurgitation is not visualized. No aortic stenosis is present.  5. The inferior vena cava is normal in size with greater than 50% respiratory variability, suggesting right atrial pressure of 3 mmHg. FINDINGS  Left Ventricle: Left ventricular ejection fraction, by estimation, is 70 to 75%. The left ventricle has hyperdynamic function. The left ventricle has no regional wall motion abnormalities. The left ventricular internal cavity size was normal in size. There is mild concentric left ventricular hypertrophy. Left ventricular diastolic parameters are consistent with Grade I diastolic dysfunction (impaired relaxation). Right Ventricle: The right ventricular size is normal. No increase in right ventricular wall thickness. Right ventricular systolic function is normal. Left Atrium: Left atrial size was normal in size. Right Atrium: Right atrial size was normal in size. Prominent Eustachian valve. Pericardium: There is no evidence of pericardial effusion. Mitral Valve: The mitral valve is normal in structure. No evidence of mitral valve regurgitation. No evidence of mitral valve stenosis. Tricuspid Valve: The tricuspid valve is normal in structure. Tricuspid valve regurgitation is not demonstrated. No evidence of tricuspid stenosis. Aortic Valve: The aortic  valve is normal in structure. Aortic valve regurgitation is not visualized. No aortic stenosis is present. Pulmonic Valve: The pulmonic valve was normal in structure. Pulmonic valve regurgitation is not visualized. No evidence of pulmonic stenosis. Aorta: The aortic root is normal in size and structure. Venous: The inferior vena  cava is normal in size with greater than 50% respiratory variability, suggesting right atrial pressure of 3 mmHg. IAS/Shunts: No atrial level shunt detected by color flow Doppler.  LEFT VENTRICLE PLAX 2D LVIDd:         4.20 cm  Diastology LVIDs:         2.80 cm  LV e' medial:    6.20 cm/s LV PW:         1.40 cm  LV E/e' medial:  8.6 LV IVS:        1.40 cm  LV e' lateral:   9.25 cm/s LVOT diam:     2.10 cm  LV E/e' lateral: 5.8 LVOT Area:     3.46 cm  RIGHT VENTRICLE             IVC RV S prime:     18.50 cm/s  IVC diam: 1.70 cm TAPSE (M-mode): 2.3 cm LEFT ATRIUM             Index       RIGHT ATRIUM           Index LA diam:        3.40 cm 1.76 cm/m  RA Area:     16.70 cm LA Vol (A2C):   56.4 ml 29.27 ml/m RA Volume:   42.60 ml  22.11 ml/m LA Vol (A4C):   36.1 ml 18.73 ml/m LA Biplane Vol: 46.1 ml 23.92 ml/m   AORTA Ao Root diam: 3.20 cm MITRAL VALVE MV Area (PHT): 2.56 cm    SHUNTS MV Decel Time: 296 msec    Systemic Diam: 2.10 cm MV E velocity: 53.60 cm/s MV A velocity: 75.40 cm/s MV E/A ratio:  0.71 Mihai Croitoru MD Electronically signed by Sanda Klein MD Signature Date/Time: 10/15/2020/3:53:16 PM    Final        Medical Problem List and Plan: 1.  Left hemiplegia and dysarthria secondary to right intraparenchymal hemorrhage on the right frontal parietal area likely due to hypertension as well as cocaine use and suggestion of amyloid angiopathy  -patient may  shower  -ELOS/Goals: 23-25 days- min-mod A and S for SLP 2.  Antithrombotics: -DVT/anticoagulation: SCDs  -antiplatelet therapy: N/A 3. Pain Management: Tylenol as needed -pt reports tylenol doesn't work - suggest  Tramadol? 4. Mood/schizophrenia: Provide emotional support  -antipsychotic agents: N/A 5. Neuropsych: This patient is capable of making decisions on his own behalf. 6. Skin/Wound Care: Routine skin checks 7. Fluids/Electrolytes/Nutrition: Routine in and outs with follow-up chemistries 8.  Hypertension.  Norvasc 10 mg daily 9.  Diabetes mellitus.  Hemoglobin A1c 10.1.  NovoLog 7 units 3 times daily, Levemir 22 units nightly 10.  History of alcohol polysubstance use.  Urine drug screen positive cocaine.  Provide counseling 11.  History of hepatitis C.  Follow-up outpatient 12. L foot drop- suggest possible AFO, etc and PRAFO to reduce risk of L ankle contracture 13. L inattention- per primary team  Cathlyn Parsons, PA-C 10/17/2020   I have personally performed a face to face diagnostic evaluation of this patient and formulated the key components of the plan.  Additionally, I have personally reviewed laboratory data, imaging studies, as well as relevant notes and concur with the physician assistant's documentation above.

## 2020-10-17 NOTE — Progress Notes (Signed)
PROGRESS NOTE    DAXTEN KOVALENKO   OVF:[id number]  DOB: 09-20-1945  PCP: Theotis Burrow, MD    DOA: 10/14/2020 LOS: 3   Brief Narrative   Hospitalist team is Consulted by neurology for medical management of patient admitted with a hemorrhagic stroke.  Admission course reviewed and summarized:  Ervie Mccard Thompsonis a 75 y.o.malepast medical history of hepatitis C, diabetes, schizophrenia presented to ER for 2 weeks of weakness and difficulty walking. He reports the weakness is generalized but more so in his left leg and having difficulty walking. He was seen in December 2021 after he was being found down and was noted to have rapid shifts in his mentations. He was positive for cocaine at that time and also admits to doing cocaine 2 days ago this time.   He was extremely hypertensive on arrival to [facility] ED.  CT head in the ED showed a Right frontal intraparenchymal hemorrhage.Started on Cleviprex drip and transferred ED to ED for ICU admission at [facility]. Prior MRIs were suggestive of chronic microangiopathy and mild hemorrhages concerning for amyloid. Known right frontal meningioma,stable compared to prior imaging.     Assessment & Plan   Active Problems:   ICH (intracerebral hemorrhage) (HCC)   Right frontal intracranial hemorrhage - mgmt per neuro / stroke team.   Uncontrolled Hypertension - due to ongoing cocaine abuse and not on antihypertensives at home.  Initially on Cleviprex. --continue amlodipine --labetalol PRN --BP goal systolic < 841 for now, normo-tensive long term --Needs to establish with PCP for ongoing long term management --Aggressive stroke risk reduction: stop smoking, complete cessation of cocaine or other illicit substances  Chronic HFpEF - grade 1 diastolic dysfunction and mild LVH on echo.  Euvolemic.  Monitor.  Type 2 diabetes, uncontrolled A1c 10.1% - Levemir basal, increase Novolog 4>>7 units TID WC + sliding scale.    Titrate insulin for inpatient goal 140-180. Strict glycemic control after d/c. Close outpatient follow up.  Hyperlipidemia - continue statin, started on Lipitor 20 mg Goal LDL < 70 (current LDL 59).  PAD - on statin  Meningioma - known previously and stable on imaging.  Not contributing to his acute neurologic deficits.  Schizophrenia - appears stable.  Continue Zyprexa.  Hx of hepatitis C    Patient BMI: Body mass index is 25.07 kg/m.   DVT prophylaxis: SCD's Start: 10/14/20 2143   Diet:  Diet Orders (From admission, onward)    Start     Ordered   10/15/20 1906  Diet Carb Modified Fluid consistency: Thin; Room service appropriate? Yes  Diet effective now       Comments: Heart healthy  Question Answer Comment  Diet-HS Snack? Nothing   Calorie Level Medium 1600-2000   Fluid consistency: Thin   Room service appropriate? Yes      10/15/20 1940            Code Status: Full Code    Subjective 10/17/20    Pt seen in ICU this AM. Denies headache or any worsening weakness or new neurologic complaints.  Denies pain, F/C, N/V, CP, SOB or other acute complaints.   Disposition Plan & Communication   Status is: Inpatient  Inpatient status per primary service, appropriate due to ongoing evaluation and will need rehab placement.  Dispo: The patient is from: home              Anticipated d/c is to: CIR  Patient currently not medically stable for d/c   Difficult to place patient unknown   Consults, Procedures, Significant Events   Consultants:   [place]  Neuro Stroke team primary  Procedures:   none  Antimicrobials:  Anti-infectives (From admission, onward)   None        Micro    Objective   Vitals:   10/17/20 0900 10/17/20 1000 10/17/20 1100 10/17/20 1200  BP: 137/79 134/82 135/76 129/64  Pulse: 71 70 69 68  Resp: 15 19 17  (!) 23  Temp:    99 F (37.2 C)  TempSrc:    Oral  SpO2: 100% 100% 100% 100%  Weight:      Height:         Intake/Output Summary (Last 24 hours) at 10/17/2020 1316 Last data filed at 10/17/2020 1200 Gross per 24 hour  Intake 720 ml  Output 2075 ml  Net -1355 ml   Filed Weights   10/14/20 2151  Weight: 77 kg    Physical Exam:  General exam: sleeping, woke to voice, no acute distress HEENT: moist mucus membranes, hearing grossly normal  Respiratory system: CTAB, no wheezes, rales or rhonchi, normal respiratory effort. Cardiovascular system: normal S1/S2, RRRno pedal edema.   Gastrointestinal system: soft, NT, ND Central nervous system: normal speech, CN's grossly intact,  Psychiatry: normal mood, flat affect  Labs   Data Reviewed: I have personally reviewed following labs and imaging studies  CBC: Recent Labs  Lab 10/14/20 1922 10/15/20 0234 10/16/20 0423 10/17/20 0347  WBC 11.1* 13.0* 10.8* 9.4  NEUTROABS 8.1*  --   --   --   HGB 13.2 14.5 14.1 13.6  HCT 39.4 43.5 43.4 42.3  MCV 90.6 91.4 93.5 93.8  PLT 170 250 223 [id number]   Basic Metabolic Panel: Recent Labs  Lab 10/14/20 1922 10/15/20 0234 10/16/20 0423 10/17/20 0347  NA 137 135 136 136  K 3.8 3.6 3.9 3.9  CL 103 101 103 104  CO2 26 27 24 26   GLUCOSE 167* 342* 201* 194*  BUN 17 13 15 17   CREATININE 0.75 0.86 0.83 0.87  CALCIUM 8.9 8.9 8.6* 8.6*   GFR: Estimated Creatinine Clearance: 76.7 mL/min (by C-G formula based on SCr of 0.87 mg/dL). Liver Function Tests: Recent Labs  Lab 10/14/20 1922 10/15/20 0234  AST 14* 14*  ALT 13 13  ALKPHOS 81 93  BILITOT 0.6 0.5  PROT 7.4 7.3  ALBUMIN 3.4* 3.1*   No results for input(s): LIPASE, AMYLASE in the last 168 hours. No results for input(s): AMMONIA in the last 168 hours. Coagulation Profile: Recent Labs  Lab 10/14/20 1922  INR 1.0   Cardiac Enzymes: No results for input(s): CKTOTAL, CKMB, CKMBINDEX, TROPONINI in the last 168 hours. BNP (last 3 results) No results for input(s): PROBNP in the last 8760 hours. HbA1C: Recent Labs    10/15/20 0234  10/15/20 0338  HGBA1C 6.0* 10.1*   CBG: Recent Labs  Lab 10/16/20 1132 10/16/20 1714 10/16/20 2148 10/17/20 0718 10/17/20 1114  GLUCAP 272* 135* 347* 215* 283*   Lipid Profile: Recent Labs    10/15/20 0234  CHOL 159  HDL 26*  LDLCALC 59  TRIG 369*  CHOLHDL 6.1   Thyroid Function Tests: No results for input(s): TSH, T4TOTAL, FREET4, T3FREE, THYROIDAB in the last 72 hours. Anemia Panel: No results for input(s): VITAMINB12, FOLATE, FERRITIN, TIBC, IRON, RETICCTPCT in the last 72 hours. Sepsis Labs: No results for input(s): PROCALCITON, LATICACIDVEN in the last 168 hours.  Recent  Results (from the past 240 hour(s))  Resp Panel by RT-PCR (Flu A&B, Covid) Nasopharyngeal Swab     Status: None   Collection Time: 10/14/20  8:00 PM   Specimen: Nasopharyngeal Swab; Nasopharyngeal(NP) swabs in vial transport medium  Result Value Ref Range Status   SARS Coronavirus 2 by RT PCR NEGATIVE NEGATIVE Final    Comment: (NOTE) SARS-CoV-2 target nucleic acids are NOT DETECTED.  The SARS-CoV-2 RNA is generally detectable in upper respiratory specimens during the acute phase of infection. The lowest concentration of SARS-CoV-2 viral copies this assay can detect is 138 copies/mL. A negative result does not preclude SARS-Cov-2 infection and should not be used as the sole basis for treatment or other patient management decisions. A negative result may occur with  improper specimen collection/handling, submission of specimen other than nasopharyngeal swab, presence of viral mutation(s) within the areas targeted by this assay, and inadequate number of viral copies(<138 copies/mL). A negative result must be combined with clinical observations, patient history, and epidemiological information. The expected result is Negative.  Fact Sheet for Patients:  EntrepreneurPulse.com.au  Fact Sheet for Healthcare Providers:  IncredibleEmployment.be  This test is  no t yet approved or cleared by the [place] FDA and  has been authorized for detection and/or diagnosis of SARS-CoV-2 by FDA under an Emergency Use Authorization (EUA). This EUA will remain  in effect (meaning this test can be used) for the duration of the COVID-19 declaration under Section 564(b)(1) of the Act, 21 U.S.C.section 360bbb-3(b)(1), unless the authorization is terminated  or revoked sooner.       Influenza A by PCR NEGATIVE NEGATIVE Final   Influenza B by PCR NEGATIVE NEGATIVE Final    Comment: (NOTE) The Xpert Xpress SARS-CoV-2/FLU/RSV plus assay is intended as an aid in the diagnosis of influenza from Nasopharyngeal swab specimens and should not be used as a sole basis for treatment. Nasal washings and aspirates are unacceptable for Xpert Xpress SARS-CoV-2/FLU/RSV testing.  Fact Sheet for Patients: EntrepreneurPulse.com.au  Fact Sheet for Healthcare Providers: IncredibleEmployment.be  This test is not yet approved or cleared by the [place] FDA and has been authorized for detection and/or diagnosis of SARS-CoV-2 by FDA under an Emergency Use Authorization (EUA). This EUA will remain in effect (meaning this test can be used) for the duration of the COVID-19 declaration under Section 564(b)(1) of the Act, 21 U.S.C. section 360bbb-3(b)(1), unless the authorization is terminated or revoked.  Performed at [facility], [address] [place], [place] [address]   MRSA PCR Screening     Status: Abnormal   Collection Time: 10/14/20 10:34 PM   Specimen: Nasal Mucosa; Nasopharyngeal  Result Value Ref Range Status   MRSA by PCR POSITIVE (A) NEGATIVE Final    Comment:        The GeneXpert MRSA Assay (FDA approved for NASAL specimens only), is one component of a comprehensive MRSA colonization surveillance program. It is not intended to diagnose MRSA infection nor to guide or monitor treatment for MRSA  infections. RESULT CALLED TO, READ BACK BY AND VERIFIED WITH: RN B ALI AT 0105 10/15/2020 BY L BENFIELD Performed at [facility], [place] [address], [place], [place] [address]       Imaging Studies   ECHOCARDIOGRAM COMPLETE  Result Date: 10/15/2020    ECHOCARDIOGRAM REPORT   Patient Name:   RICKY DIGNAM Date of Exam: 10/15/2020 Medical Rec #:  [id number]        Height:  69.0 in Accession #:    [id number]       Weight:       169.8 lb Date of Birth:  05-08-1946        BSA:          1.927 m Patient Age:    42 years         BP:           122/77 mmHg Patient Gender: M                HR:           83 bpm. Exam Location:  Inpatient Procedure: 2D Echo Indications:    stroke  History:        Patient has prior history of Echocardiogram examinations, most                 recent 05/15/2020. Hepatitis C; Risk Factors:Diabetes and IV drug                 use.  Sonographer:    Johny Chess Referring Phys: [id number] ASHISH ARORA IMPRESSIONS  1. Left ventricular ejection fraction, by estimation, is 70 to 75%. The left ventricle has hyperdynamic function. The left ventricle has no regional wall motion abnormalities. There is mild concentric left ventricular hypertrophy. Left ventricular diastolic parameters are consistent with Grade I diastolic dysfunction (impaired relaxation).  2. Right ventricular systolic function is normal. The right ventricular size is normal.  3. The mitral valve is normal in structure. No evidence of mitral valve regurgitation. No evidence of mitral stenosis.  4. The aortic valve is normal in structure. Aortic valve regurgitation is not visualized. No aortic stenosis is present.  5. The inferior vena cava is normal in size with greater than 50% respiratory variability, suggesting right atrial pressure of 3 mmHg. FINDINGS  Left Ventricle: Left ventricular ejection fraction, by estimation, is 70 to 75%. The left ventricle has hyperdynamic function. The left ventricle has no regional wall  motion abnormalities. The left ventricular internal cavity size was normal in size. There is mild concentric left ventricular hypertrophy. Left ventricular diastolic parameters are consistent with Grade I diastolic dysfunction (impaired relaxation). Right Ventricle: The right ventricular size is normal. No increase in right ventricular wall thickness. Right ventricular systolic function is normal. Left Atrium: Left atrial size was normal in size. Right Atrium: Right atrial size was normal in size. Prominent Eustachian valve. Pericardium: There is no evidence of pericardial effusion. Mitral Valve: The mitral valve is normal in structure. No evidence of mitral valve regurgitation. No evidence of mitral valve stenosis. Tricuspid Valve: The tricuspid valve is normal in structure. Tricuspid valve regurgitation is not demonstrated. No evidence of tricuspid stenosis. Aortic Valve: The aortic valve is normal in structure. Aortic valve regurgitation is not visualized. No aortic stenosis is present. Pulmonic Valve: The pulmonic valve was normal in structure. Pulmonic valve regurgitation is not visualized. No evidence of pulmonic stenosis. Aorta: The aortic root is normal in size and structure. Venous: The inferior vena cava is normal in size with greater than 50% respiratory variability, suggesting right atrial pressure of 3 mmHg. IAS/Shunts: No atrial level shunt detected by color flow Doppler.  LEFT VENTRICLE PLAX 2D LVIDd:         4.20 cm  Diastology LVIDs:         2.80 cm  LV e' medial:    6.20 cm/s LV PW:         1.40 cm  LV E/e'  medial:  8.6 LV IVS:        1.40 cm  LV e' lateral:   9.25 cm/s LVOT diam:     2.10 cm  LV E/e' lateral: 5.8 LVOT Area:     3.46 cm  RIGHT VENTRICLE             IVC RV S prime:     18.50 cm/s  IVC diam: 1.70 cm TAPSE (M-mode): 2.3 cm LEFT ATRIUM             Index       RIGHT ATRIUM           Index LA diam:        3.40 cm 1.76 cm/m  RA Area:     16.70 cm LA Vol (A2C):   56.4 ml 29.27 ml/m RA  Volume:   42.60 ml  22.11 ml/m LA Vol (A4C):   36.1 ml 18.73 ml/m LA Biplane Vol: 46.1 ml 23.92 ml/m   AORTA Ao Root diam: 3.20 cm MITRAL VALVE MV Area (PHT): 2.56 cm    SHUNTS MV Decel Time: 296 msec    Systemic Diam: 2.10 cm MV E velocity: 53.60 cm/s MV A velocity: 75.40 cm/s MV E/A ratio:  0.71 Mihai Croitoru MD Electronically signed by Sanda Klein MD Signature Date/Time: 10/15/2020/3:53:16 PM    Final      Medications   Scheduled Meds: .  stroke: mapping our early stages of recovery book   Does not apply Once  . amLODipine  5 mg Oral Daily  . insulin aspart  0-15 Units Subcutaneous TID WC  . insulin aspart  0-5 Units Subcutaneous QHS  . insulin aspart  4 Units Subcutaneous TID WC  . insulin detemir  18 Units Subcutaneous QHS  . mupirocin ointment  1 application Nasal BID  . pantoprazole (PROTONIX) IV  40 mg Intravenous QHS  . senna-docusate  1 tablet Oral BID   Continuous Infusions:     LOS: 3 days    Time spent: 30 minutes    Ezekiel Slocumb, DO [facility]  10/17/2020, 1:16 PM      If 7PM-7AM, please contact night-coverage. How to contact the [facility] Attending or Consulting provider [place] or covering provider during after hours [place], for this patient?    1. Check the care team in [facility] and look for a) attending/consulting TR[facility] provider listed and b) the [facility] team listed 2. Log into [email or web address] and use [place]'s universal password to access. If you do not have the password, please contact the hospital operator. 3. Locate the [facility] provider you are looking for under [facility] and page to a number that you can be directly reached. 4. If you still have difficulty reaching the provider, please page the [facility] (Director on Call) for the Hospitalists listed on amion for assistance.

## 2020-10-17 NOTE — Progress Notes (Signed)
Inpatient Rehab Admissions Coordinator:    I have insurance auth for CIR, but pt. Received IV labetalol this AM and does not appear medically ready for CIR at this time. I will follow for potential admit once medically ready.   Clemens Catholic, [place], [place] Admissions Coordinator  [phone] ([place]) [phone] (office)

## 2020-10-17 NOTE — Progress Notes (Signed)
Inpatient Diabetes Program Recommendations  AACE/ADA: New Consensus Statement on Inpatient Glycemic Control (2015)  Target Ranges:  Prepandial:   less than 140 mg/dL      Peak postprandial:   less than 180 mg/dL (1-2 hours)      Critically ill patients:  140 - 180 mg/dL   Lab Results  Component Value Date   GLUCAP 283 (H) 10/17/2020   HGBA1C 10.1 (H) 10/15/2020    Review of Glycemic Control Results for Lance [name], Lance [name] (MRN [id number]) as of 10/17/2020 13:05  Ref. Range 10/16/2020 07:33 10/16/2020 11:32 10/16/2020 17:14 10/16/2020 21:48 10/17/2020 07:18 10/17/2020 11:14  Glucose-Capillary Latest Ref Range: 70 - 99 mg/dL 222 (H)  Novolog 5 units 272 (H)  Novolog 8 units 135 (H)  Novolog 2 units 347 (H)  Novolog 4 units  Levemir 18 units 215 (H)  Novolog 9 units 283 (H)  Novolog 12 units   Diabetes history: DM2 Outpatient Diabetes medications: Levemir 18 units qd + Novolog 4 units tid meal coverage  Current orders for Inpatient glycemic control: Lantus 18 units qd + Novolog 0-15 units tid + hs 0-5 units, Novolog 4 units tid meal coverage  Inpatient Diabetes Program Recommendations:    -  Increase Lantus to 22 units -  Increase Novolog meal coverage to 6 units tid if eating >50% of meals.   Thank you, Tama Headings RN, MSN, BC-ADM Inpatient Diabetes Coordinator Team Pager [phone] (8a-5p)

## 2020-10-17 NOTE — Hospital Course (Signed)
[name] is a 75 y.o. male with history of hepatitis C, diabetes, schizophrenia, tobacco use, ongoing cocaine abuse, possible amyloid angiopathy, and hx of being found down in December 2021 with rapid shifts in his mentation. The pt presented to the ED at [facility] with complaints of 2 weeks of generalized weakness, progressive LLE weakness with difficulty ambulating, and incoordination. He was found to be extremely hypertensive -> treated with Cleviprex and transferred to [facility] after a CT Head showed a right frontal intraparenchymal hemorrhage. He did not receive tPA due to [place].     Right frontal intraparenchymal hemorrhage thought most likely due to severe hypertension secondary to cocaine use and amyloid angiopathy.

## 2020-10-17 NOTE — Progress Notes (Signed)
Physical Therapy Treatment Patient Details Name: Lance [name] MRN: [id number] DOB: 21-Jul-1945 Today's Date: 10/17/2020    History of Present Illness Pt is a 75 y.o. M who presents to ER after 2 weeks of weakness and difficulty walking. Pt positive for cocaine. CT head showing right frontal intraparenchymal hemorrhage. MRI 5/8 showing stable intra axial hemorrhage in R superior frontal gyrus and stable right anterior frontal convexity en plaque meningioma. Significant PMH: diabetes mellitus, hepatitis C, schizophrenia.    PT Comments    Session focused on sitting balance to promote midline positioning. Pt with continued left lateral lean and cervical flexion. Use of mirror for visual feedback; benefits from providing R hand with functional task I.e. rubbing lotion onto legs, reaching for objects, to prevent pushing through RUE. Suspect continued progress given PLOF and motivation. Continue to recommend comprehensive inpatient rehab (CIR) for post-acute therapy needs.    Follow Up Recommendations  CIR     Equipment Recommendations  Wheelchair (measurements PT);Wheelchair cushion (measurements PT);Hospital bed;3in1 (PT)    Recommendations for Other Services       Precautions / Restrictions Precautions Precautions: Fall;Other (comment) Precaution Comments: L hemiplegia, BP < 160 Restrictions Weight Bearing Restrictions: No    Mobility  Bed Mobility Overal bed mobility: Needs Assistance Bed Mobility: Rolling;Sidelying to Sit;Sit to Sidelying Rolling: Mod assist Sidelying to sit: Max assist;+2 for physical assistance   Sit to supine: Max assist;+2 for physical assistance   General bed mobility comments: Pt rolling towards the left, cues for bending up R knee, reaching over with R hand, use of bed pad to guide hips, assist for BLE's off edge of bed and trunk to upright and with return to supine.    Transfers                    Ambulation/Gait                  Stairs             Wheelchair Mobility    Modified Rankin (Stroke Patients Only) Modified Rankin (Stroke Patients Only) Pre-Morbid Rankin Score: Slight disability Modified Rankin: Severe disability     Balance Overall balance assessment: Needs assistance Sitting-balance support: Feet supported Sitting balance-Leahy Scale: Poor Sitting balance - Comments: supervisionA to maxA for static sitting with L lateral lean Postural control: Left lateral lean                                  Cognition Arousal/Alertness: Awake/alert Behavior During Therapy: Flat affect Overall Cognitive Status: Impaired/Different from baseline Area of Impairment: Orientation;Attention;Memory;Following commands;Safety/judgement;Awareness;Problem solving                 Orientation Level: Disoriented to;Place;Time;Situation Current Attention Level: Focused Memory: Decreased short-term memory Following Commands: Follows one step commands inconsistently;Follows one step commands with increased time Safety/Judgement: Decreased awareness of deficits;Decreased awareness of safety Awareness: Emergent Problem Solving: Slow processing;Difficulty sequencing;Requires verbal cues;Requires tactile cues General Comments: Pt oriented to self and month, not oriented to hospital name or year. Able to state he was in [place], [place]. Smiling and engaging with therapist today. Decreased awareness of safety/deficits; follows 1 step commands inconsistently during sitting balance tasks.      Exercises Other Exercises Other Exercises: Sitting edge of bed: Use of mirror as visual cue for midline positioning, frequent verbal cueing for "right hand in your lap, palm up," as he tends to push  with that extremity. VC's for cervical extension, looking to right and left, rubbing lotion with R hand onto R leg and L leg, reaching with RUE for bottle to promote anterior and right weight shift Other Exercises:  Supine: soft tissue mobilization of L upper trapezius    General Comments        Pertinent Vitals/Pain Pain Assessment: Faces Faces Pain Scale: Hurts even more Pain Location: head, L neck Pain Descriptors / Indicators: Discomfort;Grimacing;Guarding Pain Intervention(s): Limited activity within patient's tolerance;Monitored during session    Home Living       Type of Home: House              Prior Function            PT Goals (current goals can now be found in the care plan section) Acute Rehab PT Goals Patient Stated Goal: didn't state Potential to Achieve Goals: Good Progress towards PT goals: Progressing toward goals    Frequency    Min 4X/week      PT Plan Current plan remains appropriate    Co-evaluation              AM-PAC PT "6 Clicks" Mobility   Outcome Measure  Help needed turning from your back to your side while in a flat bed without using bedrails?: A Lot Help needed moving from lying on your back to sitting on the side of a flat bed without using bedrails?: A Lot Help needed moving to and from a bed to a chair (including a wheelchair)?: Total Help needed standing up from a chair using your arms (e.g., wheelchair or bedside chair)?: Total Help needed to walk in hospital room?: Total Help needed climbing 3-5 steps with a railing? : Total 6 Click Score: 8    End of Session Equipment Utilized During Treatment: Gait belt Activity Tolerance: Patient tolerated treatment well Patient left: with call bell/phone within reach;in bed;with bed alarm set Nurse Communication: Mobility status PT Visit Diagnosis: Hemiplegia and hemiparesis;Unsteadiness on feet (R26.81);Difficulty in walking, not elsewhere classified (R26.2) Hemiplegia - Right/Left: Left Hemiplegia - dominant/non-dominant: Non-dominant Hemiplegia - caused by: Nontraumatic intracerebral hemorrhage     Time: 1450-1515 PT Time Calculation (min) (ACUTE ONLY): 25 min  Charges:   $Therapeutic Activity: 8-22 mins $Neuromuscular Re-education: 8-22 mins                     Wyona Almas, PT, DPT Acute Rehabilitation Services Pager [phone] Office [place] 10/17/2020, 4:48 PM

## 2020-10-17 NOTE — Progress Notes (Signed)
STROKE TEAM PROGRESS NOTE     INTERVAL HISTORY Patient is sitting up in bed.  He has no complaints.  Vital signs stable.  Blood pressure adequately controlled.  Neurological exam is unchanged.  No acute events.Transfer orders for the floor since yesterday. In floor status in the ICU. Denies new concerns today.   OBJECTIVE Vitals:   10/17/20 0700 10/17/20 0800 10/17/20 0900 10/17/20 1000  BP: 137/88 124/72 137/79 134/82  Pulse: 72 72 71 70  Resp: 19 18 15 19   Temp:  99.1 F (37.3 C)    TempSrc:  Oral    SpO2: 100% 100% 100% 100%  Weight:      Height:       CBC:  Recent Labs  Lab 10/14/20 1922 10/15/20 0234 10/16/20 0423 10/17/20 0347  WBC 11.1*   < > 10.8* 9.4  NEUTROABS 8.1*  --   --   --   HGB 13.2   < > 14.1 13.6  HCT 39.4   < > 43.4 42.3  MCV 90.6   < > 93.5 93.8  PLT 170   < > 223 198   < > = values in this interval not displayed.    Basic Metabolic Panel:  Recent Labs  Lab 10/16/20 0423 10/17/20 0347  NA 136 136  K 3.9 3.9  CL 103 104  CO2 24 26  GLUCOSE 201* 194*  BUN 15 17  CREATININE 0.83 0.87  CALCIUM 8.6* 8.6*    Lipid Panel:     Component Value Date/Time   CHOL 159 10/15/2020 0234   TRIG 369 (H) 10/15/2020 0234   HDL 26 (L) 10/15/2020 0234   CHOLHDL 6.1 10/15/2020 0234   VLDL 74 (H) 10/15/2020 0234   LDLCALC 59 10/15/2020 0234   HgbA1c:  Lab Results  Component Value Date   HGBA1C 10.1 (H) 10/15/2020   Urine Drug Screen:     Component Value Date/Time   LABOPIA NONE DETECTED 10/14/2020 2000   COCAINSCRNUR POSITIVE (A) 10/14/2020 2000   LABBENZ NONE DETECTED 10/14/2020 2000   AMPHETMU NONE DETECTED 10/14/2020 2000   THCU NONE DETECTED 10/14/2020 2000   LABBARB NONE DETECTED 10/14/2020 2000    Alcohol Level     Component Value Date/Time   ETH <10 10/14/2020 1922    IMAGING  CT Head Wo Contrast 10/14/2020 IMPRESSION:  1. 3.4 cm x 1.7 cm x 2.8 cm acute right frontoparietal parenchymal bleed, as described above. MRI correlation  is recommended.  2. Stable right anterior cranial fossa meningioma.   MRI BRAIN W & WO CONTRAST -  IMPRESSION: 1. Stable intra-axial hemorrhage in the right superior frontal gyrus/motor strip since yesterday, estimated volume 15 mL. 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 Mild surrounding edema. No significant mass effect or other complicating features.  2. Stable right anterior frontal convexity en plaque Meningioma since December. Anterior segment of the superior sagittal sinus occluded as before but the other dural venous sinuses remain patent.  3. Small bilateral subdural hematomas have resolved since December.  DG Chest 2 View 10/14/2020 IMPRESSION: Stable exam without active cardiopulmonary disease.   CT Cervical Spine Wo Contrast 10/14/2020 IMPRESSION:  1. No evidence of acute cervical spine fracture or subluxation.  2. Marked severity multilevel degenerative changes.   Transthoracic Echocardiogram  10/14/2020 Left Ventricle: Left ventricular ejection fraction, by estimation, is 70  to 75%. The left ventricle has hyperdynamic function. The left ventricle has no regional wall motion abnormalities. The left  ventricular internal cavity size was normal in size.  There is mild concentric left ventricular hypertrophy. Left ventricular diastolic parameters are consistent with Grade I diastolic dysfunction  (impaired relaxation).  Right Ventricle: The right ventricular size is normal. No increase in  right ventricular wall thickness. Right ventricular systolic function is  normal.  Left Atrium: Left atrial size was normal in size.  Right Atrium: Right atrial size was normal in size. Prominent Eustachian valve.  Pericardium: There is no evidence of pericardial effusion.  Mitral Valve: The mitral valve is normal in structure. No evidence of  mitral valve regurgitation. No evidence of mitral valve stenosis.  Tricuspid Valve: The  tricuspid valve is normal in structure. Tricuspid valve regurgitation is not demonstrated. No evidence of tricuspid stenosis.   Aortic Valve: The aortic valve is normal in structure. Aortic valve  regurgitation is not visualized. No aortic stenosis is present.   Pulmonic Valve: The pulmonic valve was normal in structure. Pulmonic valve regurgitation is not visualized. No evidence of pulmonic stenosis.   Aorta: The aortic root is normal in size and structure.   Venous: The inferior vena cava is normal in size with greater than 50% respiratory variability, suggesting right atrial pressure of 3 mmHg.   IAS/Shunts: No atrial level shunt detected by color flow Doppler.   PHYSICAL EXAM Blood pressure 134/82, pulse 70, temperature 99.1 F (37.3 C), temperature source Oral, resp. rate 19, height 5\' 9"  (1.753 m), weight 77 kg, SpO2 100 %.  Pleasant elderly African-American male not in distress. GENERAL: 74 y.o.male sitting up in bed in NAD.   HEENT: Normal  ABDOMEN: soft  EXTREMITIES: No edema   BACK: Normal  SKIN: Normal by inspection.    MENTAL STATUS: He is awake and alert.  He does have a moderate dysarthria.     CRANIAL NERVES: Pupils are equal, round and reactive to light and accomodation; extra ocular movements are full, there is no significant nystagmus; visual fields are full; there is flattening of the nasolabial fold - L; tongue is midline  MOTOR: Left upper extremity 2/5 in the left leg 1/5.  Right side 5/5.  COORDINATION: No dysmetria noted on the right side.  ASSESSMENT/PLAN Lance [name] is a 75 y.o. male with history of hepatitis C, diabetes, schizophrenia, tobacco use, ongoing cocaine abuse, possible amyloid angiopathy, and hx of being found down in December 2021 with rapid shifts in his mentation. The pt presented to the ER at [facility] for 2 weeks of generalized weakness, LLE weakness, difficulty walking, and incoordination. He was found to be  extremely hypertensive -> treated with cleviprex and tx'd to [facility] ED after a CT Head showed a right frontal intraparenchymal hemorrhage. He did not receive tPA due to [place].  Right frontal intraparenchymal hemorrhage likely due to severe hypertension secondary to cocaine use and amyloid angiopathy.   Resultant left hemiparesis and dysarthria  Code Stroke CT Head - not ordered  CT head - 3.4 cm x 1.7 cm x 2.8 cm acute right frontoparietal parenchymal bleed  MRI head W&WO - Stable hemorrhage  2D Echo -EF 70%, No thrombus, wall motion abnormality or shunt found.   LDL - 59  HgbA1c - 10.1  UDS - positive for cocaine  VTE prophylaxis - SCDs Diet  Diet Order            Diet Carb Modified Fluid consistency: Thin; Room service appropriate? Yes  Diet effective now  No antithrombotic/antiplatelet prior to admission.   No anticoagulant or antiplatelets   Ongoing aggressive stroke risk factor management  Transfer to floor with hospitalist team to assume per protocol.   Therapy recommendations:  CIR,Rehab admissions coordinator note indicates evaluation proceeding for admission.   Disposition:  Pending  Hypertension  Home BP meds: none, uncontrolled, unclear history  Current BP meds: Off Cleviprex. Norvasc 5mg  daily started 5/9. Continue normodyne prn  Stable . SBP goal < 160 mm Hg. . 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   Known Meningioma  Stable on imaging  Other Stroke Risk Factors  Advanced age  Cigarette smoker - advised to stop smoking  Previous ETOH use  Substance abuse  Other Active Problems, Findings, Recommendations and/or Plan  Code status - Full Code  Hepatitis C  Schizophrenia hx - Zyprexa pta  Leukocytosis - WBC's - 11.1->13.0->10.8   Hospital day # 3 Transfer to neurology floor bed later today when available.  Mobilize out of bed.  Continue ongoing therapies.This patient is critically ill and at significant risk of neurological worsening, death and care requires constant monitoring of vital signs, hemodynamics,respiratory and cardiac monitoring, extensive review of multiple databases, frequent neurological assessment, discussion with family, other specialists and medical decision making of high complexity.I have made any additions or clarifications directly to the above note.This critical care time does not reflect procedure time, or teaching time or supervisory time of PA/NP/Med Resident etc but could involve care discussion time.  I spent 30 minutes of neurocritical care time  in the care of  this patient.     Lance Contras, MD     To contact Stroke Continuity provider, please refer to [email or web address] After hours, contact General Neurology

## 2020-10-18 LAB — GLUCOSE, CAPILLARY
Glucose-Capillary: 229 mg/dL — ABNORMAL HIGH (ref 70–99)
Glucose-Capillary: 224 mg/dL — ABNORMAL HIGH (ref 70–99)
Glucose-Capillary: 235 mg/dL — ABNORMAL HIGH (ref 70–99)
Glucose-Capillary: 256 mg/dL — ABNORMAL HIGH (ref 70–99)

## 2020-10-18 LAB — CBC
HCT: 39.7 % (ref 39.0–52.0)
Hemoglobin: 12.9 g/dL — ABNORMAL LOW (ref 13.0–17.0)
MCH: 30.1 pg (ref 26.0–34.0)
MCHC: 32.5 g/dL (ref 30.0–36.0)
MCV: 92.5 fL (ref 80.0–100.0)
Platelets: 207 10*3/uL (ref 150–400)
RBC: 4.29 MIL/uL (ref 4.22–5.81)
RDW: 12.8 % (ref 11.5–15.5)
WBC: 8.8 10*3/uL (ref 4.0–10.5)
nRBC: 0 % (ref 0.0–0.2)

## 2020-10-18 LAB — BASIC METABOLIC PANEL
Anion gap: 4 — ABNORMAL LOW (ref 5–15)
BUN: 12 mg/dL (ref 8–23)
CO2: 28 mmol/L (ref 22–32)
Calcium: 8.6 mg/dL — ABNORMAL LOW (ref 8.9–10.3)
Chloride: 102 mmol/L (ref 98–111)
Creatinine, Ser: 0.85 mg/dL (ref 0.61–1.24)
GFR, Estimated: >60 mL/min (ref >60)
Glucose, Bld: 225 mg/dL — ABNORMAL HIGH (ref 70–99)
Potassium: 4.1 mmol/L (ref 3.5–5.1)
Sodium: 134 mmol/L — ABNORMAL LOW (ref 135–145)

## 2020-10-18 MED ORDER — INSULIN DETEMIR 100 UNIT/ML ~~LOC~~ SOLN
22.0000 [IU] | Freq: Every day | SUBCUTANEOUS | Status: DC
  Administered 2020-10-18: 22 [IU] via SUBCUTANEOUS
  Filled 2020-10-18 (×3): qty 0.22

## 2020-10-18 NOTE — Care Management Important Message (Signed)
Important Message  Patient Details  Name: Lance [name] MRN: [id number] Date of Birth: 17-Jun-1945   Medicare Important Message Given:  Yes     Orbie Pyo 10/18/2020, 2:29 PM

## 2020-10-18 NOTE — Progress Notes (Signed)
Occupational Therapy Treatment Patient Details Name: Lance [name] MRN: [id number] DOB: September 07, 1945 Today's Date: 10/18/2020    History of present illness Pt is a 75 y.o. M who presents to ER after 2 weeks of weakness and difficulty walking. Pt positive for cocaine. CT head showing right frontal intraparenchymal hemorrhage. MRI 5/8 showing stable intra axial hemorrhage in R superior frontal gyrus and stable right anterior frontal convexity en plaque meningioma. Significant PMH: diabetes mellitus, hepatitis C, schizophrenia.   OT comments  Pt with gradual progression towards OT goals, seen in conjunction with PT to focus on improving sitting balance in prep for ADL transfers. Pt pleasant and joking throughout session, min cues to redirect to tasks due to attention deficits. Pt overall Max A x 2 for bed mobility and Max A to maintain static sitting balance EOB. Pt with heavy L lateral lean and aware of leaning though difficulty initiating movements needed to achieve midline. Pt able to demo strong L thumb adduction and 3/5 biceps strength, 0/5 shoulder strength. Pt reports repeatedly, "I'm in bad shape". Continue to recommend CIR for intensive therapies due to pt motivation and high PLOF.    Follow Up Recommendations  CIR    Equipment Recommendations  3 in 1 bedside commode;Wheelchair (measurements OT);Wheelchair cushion (measurements OT);Hospital bed    Recommendations for Other Services Rehab consult    Precautions / Restrictions Precautions Precautions: Fall;Other (comment) Precaution Comments: L hemiplegia, BP < 160 Restrictions Weight Bearing Restrictions: No       Mobility Bed Mobility Overal bed mobility: Needs Assistance Bed Mobility: Rolling;Sidelying to Sit;Sit to Sidelying Rolling: Mod assist   Supine to sit: Max assist;+2 for physical assistance Sit to supine: Max assist;+2 for physical assistance   General bed mobility comments: Pt overall Max A x 2 for all bed  mobility, multimodal cues to initiate though pt with difficulty motor planning. Pt Max A to roll to R side for adjustment of bed pads    Transfers                      Balance Overall balance assessment: Needs assistance Sitting-balance support: Feet supported Sitting balance-Leahy Scale: Poor Sitting balance - Comments: Requires grossly Max A to maintain sitting balance and correct to acheive midline. Postural control: Posterior lean;Left lateral lean                                 ADL either performed or assessed with clinical judgement   ADL Overall ADL's : Needs assistance/impaired                     Lower Body Dressing: Maximal assistance;Sitting/lateral leans;Sit to/from stand;+2 for safety/equipment;+2 for physical assistance Lower Body Dressing Details (indicate cue type and reason): Pt did impulsively bend forward to pull up R sock with R UE sitting EOB well though L lateral lean present               General ADL Comments: Limited by heavy L lateral lean and difficulty attending to directions to correct. Guided pt in WB tasks, as well as shifting to achieve midline.     Vision   Vision Assessment?: Vision impaired- to be further tested in functional context;Yes Tracking/Visual Pursuits: Impaired - to be further tested in functional context Additional Comments: decreased scanning to L side, initially able to voluntarily look at therapists' entering but at end of session, required increased  cues to scan to L side   Perception     Praxis      Cognition Arousal/Alertness: Awake/alert Behavior During Therapy: WFL for tasks assessed/performed Overall Cognitive Status: Impaired/Different from baseline Area of Impairment: Orientation;Attention;Memory;Following commands;Safety/judgement;Awareness;Problem solving                 Orientation Level: Disoriented to;Time;Situation Current Attention Level: Sustained Memory: Decreased  short-term memory Following Commands: Follows one step commands inconsistently;Follows one step commands with increased time Safety/Judgement: Decreased awareness of deficits;Decreased awareness of safety Awareness: Emergent Problem Solving: Slow processing;Difficulty sequencing;Requires verbal cues;Requires tactile cues General Comments: Pt pleasant and appropriate in conversation, improved attention though did perseverate on desire for pillows and assist to scratch back. Pt with difficulty understanding cues to lean forward, to right, etc but improved movements with pt's purposeful movements (bending to reach sock)        Exercises Other Exercises Other Exercises: WB through B elbows, balance activities and techniques to reduce R UE pushing to achieve midline   Shoulder Instructions       General Comments VSS on RA. Repositioned pt at end of session with L UE propped up on pillow for improved joint alignment and B heels floated    Pertinent Vitals/ Pain       Pain Assessment: Faces Faces Pain Scale: Hurts little more Pain Location: neck Pain Descriptors / Indicators: Discomfort;Grimacing;Guarding Pain Intervention(s): Monitored during session;Repositioned  Home Living                                          Prior Functioning/Environment              Frequency  Min 2X/week        Progress Toward Goals  OT Goals(current goals can now be found in the care plan section)  Progress towards OT goals: Progressing toward goals  Acute Rehab OT Goals Patient Stated Goal: eat a fruit cup OT Goal Formulation: With patient Time For Goal Achievement: 10/30/20 Potential to Achieve Goals: Good ADL Goals Pt Will Perform Grooming: with set-up;sitting Pt Will Perform Lower Body Dressing: sit to/from stand;with min assist;sitting/lateral leans Pt Will Transfer to Toilet: with min assist;stand pivot transfer;bedside commode Pt/caregiver will Perform Home Exercise  Program: Increased strength;Left upper extremity;With theraband;With minimal assist;With written HEP provided Additional ADL Goal #1: Pt will sequence through x5 mins of ADL tasks with minimal cues in a minimal distracted envirnment. Additional ADL Goal #2: Pt will perform higher level cognitive tasks with minimal cues to attend to task in order to continue to assess safety and cognition.  Plan Discharge plan remains appropriate    Co-evaluation    PT/OT/SLP Co-Evaluation/Treatment: Yes Reason for Co-Treatment: For patient/therapist safety;To address functional/ADL transfers   OT goals addressed during session: ADL's and self-care;Strengthening/ROM      AM-PAC OT "6 Clicks" Daily Activity     Outcome Measure   Help from another person eating meals?: A Little Help from another person taking care of personal grooming?: A Little Help from another person toileting, which includes using toliet, bedpan, or urinal?: Total Help from another person bathing (including washing, rinsing, drying)?: A Lot Help from another person to put on and taking off regular upper body clothing?: A Lot Help from another person to put on and taking off regular lower body clothing?: A Lot 6 Click Score: 13    End of Session  OT Visit Diagnosis: Unsteadiness on feet (R26.81);Muscle weakness (generalized) (M62.81);Other symptoms and signs involving cognitive function   Activity Tolerance Patient tolerated treatment well   Patient Left in bed;with call bell/phone within reach;with bed alarm set   Nurse Communication Mobility status;Other (comment) (request for fruit cup)        Time: 4888-9169 OT Time Calculation (min): 36 min  Charges: OT General Charges $OT Visit: 1 Visit OT Treatments $Therapeutic Activity: 8-22 mins  Malachy Chamber, OTR/L Acute Rehab Services Office: [phone]   Layla Maw 10/18/2020, 1:46 PM

## 2020-10-18 NOTE — Progress Notes (Signed)
Physical Therapy Treatment Patient Details Name: Lance [name] MRN: [id number] DOB: [birth year] Today's Date: 10/18/2020    History of Present Illness Pt is a 75 y.o. M who presents to ER after 2 weeks of weakness and difficulty walking. Pt positive for cocaine. CT head showing right frontal intraparenchymal hemorrhage. MRI 5/8 showing stable intra axial hemorrhage in R superior frontal gyrus and stable right anterior frontal convexity en plaque meningioma. Significant PMH: diabetes mellitus, hepatitis C, schizophrenia.    PT Comments    Patient agreeable to PT/OT session focusing improving sitting balance. Patient with decreased attention and requires cues for redirection. Patient requires maxA+2 for bed mobility and maxA to maintain sitting balance at EOB. Multimodal cueing required for finding midline and assist to maintain midline. Continues to have heavy L lateral and posterior lean. Continue to recommend comprehensive inpatient rehab (CIR) for post-acute therapy needs.     Follow Up Recommendations  CIR     Equipment Recommendations  Wheelchair (measurements PT);Wheelchair cushion (measurements PT);Hospital bed;3in1 (PT)    Recommendations for Other Services       Precautions / Restrictions Precautions Precautions: Fall;Other (comment) Precaution Comments: L hemiplegia, BP < 160 Restrictions Weight Bearing Restrictions: No    Mobility  Bed Mobility Overal bed mobility: Needs Assistance Bed Mobility: Rolling;Sidelying to Sit;Sit to Sidelying Rolling: Mod assist   Supine to sit: Max assist;+2 for physical assistance Sit to supine: Max assist;+2 for physical assistance   General bed mobility comments: Pt overall Max A x 2 for all bed mobility, multimodal cues to initiate though pt with difficulty motor planning. Pt modA to roll to R side for adjustment of bed pads    Transfers                 General transfer comment: deferred due to poor sitting  balance  Ambulation/Gait                 Stairs             Wheelchair Mobility    Modified Rankin (Stroke Patients Only)       Balance Overall balance assessment: Needs assistance Sitting-balance support: Feet supported Sitting balance-Leahy Scale: Poor Sitting balance - Comments: Requires grossly Max A to maintain sitting balance and correct to acheive midline. Postural control: Posterior lean;Left lateral lean                                  Cognition Arousal/Alertness: Awake/alert Behavior During Therapy: WFL for tasks assessed/performed Overall Cognitive Status: Impaired/Different from baseline Area of Impairment: Orientation;Attention;Memory;Following commands;Safety/judgement;Awareness;Problem solving                 Orientation Level: Disoriented to;Time;Situation Current Attention Level: Sustained Memory: Decreased short-term memory Following Commands: Follows one step commands inconsistently;Follows one step commands with increased time Safety/Judgement: Decreased awareness of deficits;Decreased awareness of safety Awareness: Emergent Problem Solving: Slow processing;Difficulty sequencing;Requires verbal cues;Requires tactile cues General Comments: Pt pleasant and appropriate in conversation, improved attention though did perseverate on desire for pillows and assist to scratch back. Pt with difficulty understanding cues to lean forward, to right, etc but improved movements with pt's purposeful movements (bending to reach sock)      Exercises Other Exercises Other Exercises: WB through B elbows, balance activities and techniques to reduce R UE pushing to achieve midline    General Comments General comments (skin integrity, edema, etc.): VSS on RA. Repositioned pt  at end of session with L UE propped up on pillow for improved joint alignment and B heels floated      Pertinent Vitals/Pain Pain Assessment: Faces Faces Pain Scale:  Hurts little more Pain Location: neck Pain Descriptors / Indicators: Discomfort;Grimacing;Guarding Pain Intervention(s): Monitored during session;Repositioned    Home Living                      Prior Function            PT Goals (current goals can now be found in the care plan section) Acute Rehab PT Goals Patient Stated Goal: eat a fruit cup PT Goal Formulation: With patient Time For Goal Achievement: 10/29/20 Potential to Achieve Goals: Good Progress towards PT goals: Progressing toward goals    Frequency    Min 4X/week      PT Plan Current plan remains appropriate    Co-evaluation PT/OT/SLP Co-Evaluation/Treatment: Yes Reason for Co-Treatment: For patient/therapist safety;To address functional/ADL transfers PT goals addressed during session: Mobility/safety with mobility;Balance OT goals addressed during session: ADL's and self-care;Strengthening/ROM      AM-PAC PT "6 Clicks" Mobility   Outcome Measure  Help needed turning from your back to your side while in a flat bed without using bedrails?: A Lot Help needed moving from lying on your back to sitting on the side of a flat bed without using bedrails?: Total Help needed moving to and from a bed to a chair (including a wheelchair)?: Total Help needed standing up from a chair using your arms (e.g., wheelchair or bedside chair)?: Total Help needed to walk in hospital room?: Total Help needed climbing 3-5 steps with a railing? : Total 6 Click Score: 7    End of Session   Activity Tolerance: Patient tolerated treatment well Patient left: in bed;with call bell/phone within reach;with bed alarm set Nurse Communication: Mobility status PT Visit Diagnosis: Hemiplegia and hemiparesis;Unsteadiness on feet (R26.81);Difficulty in walking, not elsewhere classified (R26.2) Hemiplegia - Right/Left: Left Hemiplegia - dominant/non-dominant: Non-dominant Hemiplegia - caused by: Nontraumatic intracerebral  hemorrhage     Time: 1211-1248 PT Time Calculation (min) (ACUTE ONLY): 37 min  Charges:  $Therapeutic Activity: 8-22 mins                     Lance [name] A. Gilford Rile PT, DPT Acute Rehabilitation Services Pager [phone] Office [phone]    [name] 10/18/2020, 2:43 PM

## 2020-10-18 NOTE — Progress Notes (Signed)
PROGRESS NOTE    BRACK SHADDOCK   JXB:[id number]  DOB: April 12, 1946  PCP: Theotis Burrow, MD    DOA: 10/14/2020 LOS: 4   Brief Narrative   Lance Bo Thompsonis a 75 y.o.malepast medical history of hepatitis C, diabetes, schizophrenia presented to ER for 2 weeks of weakness and difficulty walking.  There was left-sided weakness.  CT scan showed right frontal intraparenchymal hemorrhage.  Patient had poorly controlled blood pressure.  He was admitted to the neuro ICU.   Assessment & Plan    Right frontal intracranial hemorrhage  Management per stroke team.  Patient underwent echocardiogram which showed EF of 70%.  LDL 59.  HbA1c 10.1.  Urine drug screen positive for cocaine.  No antithrombotic agents due to intracranial hemorrhage.  Patient being evaluated by inpatient rehab.  Uncontrolled Hypertension due to ongoing cocaine abuse and not on antihypertensives at home.  Initially on Cleviprex. --continue amlodipine --labetalol PRN --BP goal systolic < 130 for now, normo-tensive long term --Needs to establish with PCP for ongoing long term management --Aggressive stroke risk reduction: stop smoking, complete cessation of cocaine or other illicit substances Blood pressure is reasonably well controlled.  Chronic HFpEF Grade 1 diastolic dysfunction and mild LVH on echo.  Euvolemic.  Monitor.  Type 2 diabetes, uncontrolled Levemir dose increased.  Continue SSI.  HbA1c 10.1.    Hyperlipidemia - Started on Lipitor 20 mg Goal LDL < 70 (current LDL 59).  PAD  on statin  Meningioma Known previously and stable on imaging.  Not contributing to his acute neurologic deficits.  Schizophrenia Continue Zyprexa.  Hx of hepatitis C    Patient BMI: Body mass index is 25.07 kg/m.   DVT prophylaxis: SCDs  Diet:  Diet Orders (From admission, onward)    Start     Ordered   10/15/20 1906  Diet Carb Modified Fluid consistency: Thin; Room service appropriate? Yes  Diet  effective now       Comments: Heart healthy  Question Answer Comment  Diet-HS Snack? Nothing   Calorie Level Medium 1600-2000   Fluid consistency: Thin   Room service appropriate? Yes      10/15/20 1940            Code Status: Full Code    Subjective 10/18/20    Patient denies any headaches.  No shortness of breath or chest pain.  No nausea vomiting.  Disposition Plan & Communication   Status is: Inpatient  Inpatient status per primary service, appropriate due to ongoing evaluation and will need rehab placement.  Dispo: The patient is from: home              Anticipated d/c is to: CIR              Patient currently not medically stable for d/c   Difficult to place patient unknown   Consults, Procedures, Significant Events   Consultants:   [facility]  Neuro Stroke team primary  Procedures:   none  Antimicrobials:  Anti-infectives (From admission, onward)   None        Micro    Objective   Vitals:   10/18/20 0400 10/18/20 0803 10/18/20 0959 10/18/20 1125  BP: 134/85 (!) 147/95 128/86 (!) 129/92  Pulse: 74   79  Resp: 15     Temp: 98.3 F (36.8 C) 98.1 F (36.7 C)  97.9 F (36.6 C)  TempSrc: Axillary Axillary  Oral  SpO2: 100% 98%  100%  Weight:  Height:        Intake/Output Summary (Last 24 hours) at 10/18/2020 1150 Last data filed at 10/18/2020 0649 Gross per 24 hour  Intake --  Output 1525 ml  Net -1525 ml   Filed Weights   10/14/20 2151  Weight: 77 kg    Physical Exam:  General appearance: Awake alert.  In no distress Resp: Clear to auscultation bilaterally.  Normal effort Cardio: S1-S2 is normal regular.  No S3-S4.  No rubs murmurs or bruit GI: Abdomen is soft.  Nontender nondistended.  Bowel sounds are present normal.  No masses organomegaly Extremities: No edema.  Full range of motion of lower extremities. Neurologic: Alert and oriented x3.  Left-sided weakness noted.    Labs   Data Reviewed: I have personally reviewed  following labs and imaging studies  CBC: Recent Labs  Lab 10/14/20 1922 10/15/20 0234 10/16/20 0423 10/17/20 0347 10/18/20 0329  WBC 11.1* 13.0* 10.8* 9.4 8.8  NEUTROABS 8.1*  --   --   --   --   HGB 13.2 14.5 14.1 13.6 12.9*  HCT 39.4 43.5 43.4 42.3 39.7  MCV 90.6 91.4 93.5 93.8 92.5  PLT 170 250 223 198 347   Basic Metabolic Panel: Recent Labs  Lab 10/14/20 1922 10/15/20 0234 10/16/20 0423 10/17/20 0347 10/18/20 0329  NA 137 135 136 136 134*  K 3.8 3.6 3.9 3.9 4.1  CL 103 101 103 104 102  CO2 26 27 24 26 28   GLUCOSE 167* 342* 201* 194* 225*  BUN 17 13 15 17 12   CREATININE 0.75 0.86 0.83 0.87 0.85  CALCIUM 8.9 8.9 8.6* 8.6* 8.6*   GFR: Estimated Creatinine Clearance: 78.6 mL/min (by C-G formula based on SCr of 0.85 mg/dL). Liver Function Tests: Recent Labs  Lab 10/14/20 1922 10/15/20 0234  AST 14* 14*  ALT 13 13  ALKPHOS 81 93  BILITOT 0.6 0.5  PROT 7.4 7.3  ALBUMIN 3.4* 3.1*   Coagulation Profile: Recent Labs  Lab 10/14/20 1922  INR 1.0   CBG: Recent Labs  Lab 10/17/20 0718 10/17/20 1114 10/17/20 1547 10/17/20 2116 10/18/20 0632  GLUCAP 215* 283* 230* 196* 229*     Recent Results (from the past 240 hour(s))  Resp Panel by RT-PCR (Flu A&B, Covid) Nasopharyngeal Swab     Status: None   Collection Time: 10/14/20  8:00 PM   Specimen: Nasopharyngeal Swab; Nasopharyngeal(NP) swabs in vial transport medium  Result Value Ref Range Status   SARS Coronavirus 2 by RT PCR NEGATIVE NEGATIVE Final    Comment: (NOTE) SARS-CoV-2 target nucleic acids are NOT DETECTED.  The SARS-CoV-2 RNA is generally detectable in upper respiratory specimens during the acute phase of infection. The lowest concentration of SARS-CoV-2 viral copies this assay can detect is 138 copies/mL. A negative result does not preclude SARS-Cov-2 infection and should not be used as the sole basis for treatment or other patient management decisions. A negative result may occur with   improper specimen collection/handling, submission of specimen other than nasopharyngeal swab, presence of viral mutation(s) within the areas targeted by this assay, and inadequate number of viral copies(<138 copies/mL). A negative result must be combined with clinical observations, patient history, and epidemiological information. The expected result is Negative.  Fact Sheet for Patients:  EntrepreneurPulse.com.au  Fact Sheet for Healthcare Providers:  IncredibleEmployment.be  This test is no t yet approved or cleared by the [place] FDA and  has been authorized for detection and/or diagnosis of SARS-CoV-2 by FDA under an  Emergency Use Authorization (EUA). This EUA will remain  in effect (meaning this test can be used) for the duration of the COVID-19 declaration under Section 564(b)(1) of the Act, 21 U.S.C.section 360bbb-3(b)(1), unless the authorization is terminated  or revoked sooner.       Influenza A by PCR NEGATIVE NEGATIVE Final   Influenza B by PCR NEGATIVE NEGATIVE Final    Comment: (NOTE) The Xpert Xpress SARS-CoV-2/FLU/RSV plus assay is intended as an aid in the diagnosis of influenza from Nasopharyngeal swab specimens and should not be used as a sole basis for treatment. Nasal washings and aspirates are unacceptable for Xpert Xpress SARS-CoV-2/FLU/RSV testing.  Fact Sheet for Patients: EntrepreneurPulse.com.au  Fact Sheet for Healthcare Providers: IncredibleEmployment.be  This test is not yet approved or cleared by the [place] FDA and has been authorized for detection and/or diagnosis of SARS-CoV-2 by FDA under an Emergency Use Authorization (EUA). This EUA will remain in effect (meaning this test can be used) for the duration of the COVID-19 declaration under Section 564(b)(1) of the Act, 21 U.S.C. section 360bbb-3(b)(1), unless the authorization is terminated  or revoked.  Performed at [facility], [address] [place], [place] [address]   MRSA PCR Screening     Status: Abnormal   Collection Time: 10/14/20 10:34 PM   Specimen: Nasal Mucosa; Nasopharyngeal  Result Value Ref Range Status   MRSA by PCR POSITIVE (A) NEGATIVE Final    Comment:        The GeneXpert MRSA Assay (FDA approved for NASAL specimens only), is one component of a comprehensive MRSA colonization surveillance program. It is not intended to diagnose MRSA infection nor to guide or monitor treatment for MRSA infections. RESULT CALLED TO, READ BACK BY AND VERIFIED WITH: RN B ALI AT 0105 10/15/2020 BY L BENFIELD Performed at [facility], [place] [address], [place], [place] [address]       Imaging Studies   No results found.   Medications   Scheduled Meds: .  stroke: mapping our early stages of recovery book   Does not apply Once  . amLODipine  5 mg Oral Daily  . insulin aspart  0-15 Units Subcutaneous TID WC  . insulin aspart  0-5 Units Subcutaneous QHS  . insulin aspart  7 Units Subcutaneous TID WC  . insulin detemir  22 Units Subcutaneous QHS  . mupirocin ointment  1 application Nasal BID  . pantoprazole (PROTONIX) IV  40 mg Intravenous QHS  . senna-docusate  1 tablet Oral BID   Continuous Infusions:     LOS: 4 days    Bonnielee Haff,  [facility]  10/18/2020, 11:50 AM      If 7PM-7AM, please contact night-coverage. How to contact the [facility] Attending or Consulting provider [place] or covering provider during after hours [place], for this patient?    1. Check the care team in [facility] and look for a) attending/consulting [facility] provider listed and b) the [facility] team listed 2. Log into [email or web address] and use [place]'s universal password to access. If you do not have the password, please contact the hospital operator. 3. Locate the [facility] provider you are looking for under [facility] and page to a number that you can be directly  reached. 4. If you still have difficulty reaching the provider, please page the [facility] (Director on Call) for the Hospitalists listed on amion for assistance.

## 2020-10-18 NOTE — Progress Notes (Signed)
STROKE TEAM PROGRESS NOTE     INTERVAL HISTORY Patient is sitting up in bed.  No one is at the bedside he has no complaints.  Vital signs stable.  Blood pressure adequately controlled.  Neurological exam is unchanged.  No acute events.appreciate medical hospitalist team taking over his care. OBJECTIVE Vitals:   10/18/20 0400 10/18/20 0803 10/18/20 0959 10/18/20 1125  BP: 134/85 (!) 147/95 128/86 (!) 129/92  Pulse: 74   79  Resp: 15     Temp: 98.3 F (36.8 C) 98.1 F (36.7 C)  97.9 F (36.6 C)  TempSrc: Axillary Axillary  Oral  SpO2: 100% 98%  100%  Weight:      Height:       CBC:  Recent Labs  Lab 10/14/20 1922 10/15/20 0234 10/17/20 0347 10/18/20 0329  WBC 11.1*   < > 9.4 8.8  NEUTROABS 8.1*  --   --   --   HGB 13.2   < > 13.6 12.9*  HCT 39.4   < > 42.3 39.7  MCV 90.6   < > 93.8 92.5  PLT 170   < > 198 207   < > = values in this interval not displayed.    Basic Metabolic Panel:  Recent Labs  Lab 10/17/20 0347 10/18/20 0329  NA 136 134*  K 3.9 4.1  CL 104 102  CO2 26 28  GLUCOSE 194* 225*  BUN 17 12  CREATININE 0.87 0.85  CALCIUM 8.6* 8.6*    Lipid Panel:     Component Value Date/Time   CHOL 159 10/15/2020 0234   TRIG 369 (H) 10/15/2020 0234   HDL 26 (L) 10/15/2020 0234   CHOLHDL 6.1 10/15/2020 0234   VLDL 74 (H) 10/15/2020 0234   LDLCALC 59 10/15/2020 0234   HgbA1c:  Lab Results  Component Value Date   HGBA1C 10.1 (H) 10/15/2020   Urine Drug Screen:     Component Value Date/Time   LABOPIA NONE DETECTED 10/14/2020 2000   COCAINSCRNUR POSITIVE (A) 10/14/2020 2000   LABBENZ NONE DETECTED 10/14/2020 2000   AMPHETMU NONE DETECTED 10/14/2020 2000   THCU NONE DETECTED 10/14/2020 2000   LABBARB NONE DETECTED 10/14/2020 2000    Alcohol Level     Component Value Date/Time   ETH <10 10/14/2020 1922    IMAGING  CT Head Wo Contrast 10/14/2020 IMPRESSION:  1. 3.4 cm x 1.7 cm x 2.8 cm acute right frontoparietal parenchymal bleed, as described  above. MRI correlation is recommended.  2. Stable right anterior cranial fossa meningioma.   MRI BRAIN W & WO CONTRAST -  IMPRESSION: 1. Stable intra-axial hemorrhage in the right superior frontal gyrus/motor strip since yesterday, estimated volume 15 mL. 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 Mild surrounding edema. No significant mass effect or other complicating features.  2. Stable right anterior frontal convexity en plaque Meningioma since December. Anterior segment of the superior sagittal sinus occluded as before but the other dural venous sinuses remain patent.  3. Small bilateral subdural hematomas have resolved since December.  DG Chest 2 View 10/14/2020 IMPRESSION: Stable exam without active cardiopulmonary disease.   CT Cervical Spine Wo Contrast 10/14/2020 IMPRESSION:  1. No evidence of acute cervical spine fracture or subluxation.  2. Marked severity multilevel degenerative changes.   Transthoracic Echocardiogram  10/14/2020 Left Ventricle: Left ventricular ejection fraction, by estimation, is 70  to 75%. The left ventricle has hyperdynamic function. The left ventricle has no regional wall motion  abnormalities. The left ventricular internal cavity size was normal in size.  There is mild concentric left ventricular hypertrophy. Left ventricular diastolic parameters are consistent with Grade I diastolic dysfunction  (impaired relaxation).  Right Ventricle: The right ventricular size is normal. No increase in  right ventricular wall thickness. Right ventricular systolic function is  normal.  Left Atrium: Left atrial size was normal in size.  Right Atrium: Right atrial size was normal in size. Prominent Eustachian valve.  Pericardium: There is no evidence of pericardial effusion.  Mitral Valve: The mitral valve is normal in structure. No evidence of  mitral valve regurgitation. No evidence of mitral valve stenosis.   Tricuspid Valve: The tricuspid valve is normal in structure. Tricuspid valve regurgitation is not demonstrated. No evidence of tricuspid stenosis.   Aortic Valve: The aortic valve is normal in structure. Aortic valve  regurgitation is not visualized. No aortic stenosis is present.   Pulmonic Valve: The pulmonic valve was normal in structure. Pulmonic valve regurgitation is not visualized. No evidence of pulmonic stenosis.   Aorta: The aortic root is normal in size and structure.   Venous: The inferior vena cava is normal in size with greater than 50% respiratory variability, suggesting right atrial pressure of 3 mmHg.   IAS/Shunts: No atrial level shunt detected by color flow Doppler.   PHYSICAL EXAM Blood pressure (!) 129/92, pulse 79, temperature 97.9 F (36.6 C), temperature source Oral, resp. rate 15, height 5\' 9"  (1.753 m), weight 77 kg, SpO2 100 %.  Pleasant elderly African-American male not in distress. GENERAL: 74 y.o.male sitting up in bed in NAD.   HEENT: Normal  ABDOMEN: soft  EXTREMITIES: No edema   BACK: Normal  SKIN: Normal by inspection.    MENTAL STATUS: He is awake and alert.  He does have a moderate dysarthria.     CRANIAL NERVES: Pupils are equal, round and reactive to light and accomodation; extra ocular movements are full, there is no significant nystagmus; visual fields are full; there is flattening of the nasolabial fold - L; tongue is midline  MOTOR: Left upper extremity 2/5 in the left leg 1/5.  Right side 5/5.  COORDINATION: No dysmetria noted on the right side.  ASSESSMENT/PLAN [name] is a 75 y.o. male with history of hepatitis C, diabetes, schizophrenia, tobacco use, ongoing cocaine abuse, possible amyloid angiopathy, and hx of being found down in December 2021 with rapid shifts in his mentation. The pt presented to the ER at [facility] for 2 weeks of generalized weakness, LLE weakness, difficulty walking, and  incoordination. He was found to be extremely hypertensive -> treated with cleviprex and tx'd to [facility] ED after a CT Head showed a right frontal intraparenchymal hemorrhage. He did not receive tPA due to [place].  Right frontal intraparenchymal hemorrhage likely due to severe hypertension secondary to cocaine use and amyloid angiopathy.   Resultant left hemiparesis and dysarthria  Code Stroke CT Head - not ordered  CT head - 3.4 cm x 1.7 cm x 2.8 cm acute right frontoparietal parenchymal bleed  MRI head W&WO - Stable hemorrhage  2D Echo -EF 70%, No thrombus, wall motion abnormality or shunt found.   LDL - 59  HgbA1c - 10.1  UDS - positive for cocaine  VTE prophylaxis - SCDs Diet  Diet Order            Diet Carb Modified Fluid consistency: Thin; Room service appropriate? Yes  Diet effective now  No antithrombotic/antiplatelet prior to admission.   No anticoagulant or antiplatelets   Ongoing aggressive stroke risk factor management  Transfer to floor with hospitalist team to assume per protocol.   Therapy recommendations:  CIR,Rehab admissions coordinator note indicates evaluation proceeding for admission.   Disposition:  Pending  Hypertension  Home BP meds: none, uncontrolled, unclear history  Current BP meds: Off Cleviprex. Norvasc 5mg  daily started 5/9. Continue normodyne prn  Stable . SBP goal < 160 mm Hg. . 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   Known Meningioma  Stable on imaging  Other Stroke Risk Factors  Advanced age  Cigarette smoker - advised to stop smoking  Previous ETOH use  Substance abuse  Other Active Problems, Findings, Recommendations and/or Plan  Code status - Full Code  Hepatitis C  Schizophrenia hx - Zyprexa pta  Leukocytosis - WBC's - 11.1->13.0->10.8   Hospital day # 4 Transfer to rehab when bed available.  Mobilize out of bed.  Continue ongoing therapies.  Appreciate medical hospitalist team taking over his care.  Discussed with Dr. [place] Pink.  Stroke team will sign off.  Kindly call for questions.    Antony Contras, MD     To contact Stroke Continuity provider, please refer to [email or web address] After hours, contact General Neurology

## 2020-10-19 ENCOUNTER — Other Ambulatory Visit: Payer: Self-pay

## 2020-10-19 ENCOUNTER — Inpatient Hospital Stay (HOSPITAL_COMMUNITY)
Admission: RE | Admit: 2020-10-19 | Discharge: 2020-11-17 | DRG: 057 | Disposition: A | Discharge status: Home Health | Payer: Medicare HMO | Source: Intra-hospital | Attending: Physical Medicine & Rehabilitation | Admitting: Physical Medicine & Rehabilitation

## 2020-10-19 ENCOUNTER — Encounter (HOSPITAL_COMMUNITY): Payer: Self-pay | Admitting: Physical Medicine & Rehabilitation

## 2020-10-19 DIAGNOSIS — F1410 Cocaine abuse, uncomplicated: Secondary | ICD-10-CM | POA: Diagnosis present

## 2020-10-19 DIAGNOSIS — I10 Essential (primary) hypertension: Secondary | ICD-10-CM | POA: Diagnosis present

## 2020-10-19 DIAGNOSIS — F17210 Nicotine dependence, cigarettes, uncomplicated: Secondary | ICD-10-CM | POA: Diagnosis present

## 2020-10-19 DIAGNOSIS — E1165 Type 2 diabetes mellitus with hyperglycemia: Secondary | ICD-10-CM | POA: Diagnosis present

## 2020-10-19 DIAGNOSIS — G8110 Spastic hemiplegia affecting unspecified side: Secondary | ICD-10-CM | POA: Diagnosis not present

## 2020-10-19 DIAGNOSIS — G441 Vascular headache, not elsewhere classified: Secondary | ICD-10-CM | POA: Diagnosis present

## 2020-10-19 DIAGNOSIS — E1142 Type 2 diabetes mellitus with diabetic polyneuropathy: Secondary | ICD-10-CM | POA: Diagnosis present

## 2020-10-19 DIAGNOSIS — G8929 Other chronic pain: Secondary | ICD-10-CM | POA: Diagnosis present

## 2020-10-19 DIAGNOSIS — G8194 Hemiplegia, unspecified affecting left nondominant side: Secondary | ICD-10-CM | POA: Diagnosis present

## 2020-10-19 DIAGNOSIS — Z833 Family history of diabetes mellitus: Secondary | ICD-10-CM

## 2020-10-19 DIAGNOSIS — M533 Sacrococcygeal disorders, not elsewhere classified: Secondary | ICD-10-CM | POA: Diagnosis present

## 2020-10-19 DIAGNOSIS — R29810 Facial weakness: Secondary | ICD-10-CM | POA: Diagnosis present

## 2020-10-19 DIAGNOSIS — E119 Type 2 diabetes mellitus without complications: Secondary | ICD-10-CM | POA: Diagnosis not present

## 2020-10-19 DIAGNOSIS — Z794 Long term (current) use of insulin: Secondary | ICD-10-CM | POA: Diagnosis not present

## 2020-10-19 DIAGNOSIS — G4700 Insomnia, unspecified: Secondary | ICD-10-CM | POA: Diagnosis present

## 2020-10-19 DIAGNOSIS — I619 Nontraumatic intracerebral hemorrhage, unspecified: Secondary | ICD-10-CM

## 2020-10-19 DIAGNOSIS — I69222 Dysarthria following other nontraumatic intracranial hemorrhage: Secondary | ICD-10-CM

## 2020-10-19 DIAGNOSIS — E1151 Type 2 diabetes mellitus with diabetic peripheral angiopathy without gangrene: Secondary | ICD-10-CM | POA: Diagnosis present

## 2020-10-19 DIAGNOSIS — F209 Schizophrenia, unspecified: Secondary | ICD-10-CM | POA: Diagnosis present

## 2020-10-19 DIAGNOSIS — Z79899 Other long term (current) drug therapy: Secondary | ICD-10-CM

## 2020-10-19 DIAGNOSIS — I69254 Hemiplegia and hemiparesis following other nontraumatic intracranial hemorrhage affecting left non-dominant side: Principal | ICD-10-CM

## 2020-10-19 DIAGNOSIS — M21372 Foot drop, left foot: Secondary | ICD-10-CM | POA: Diagnosis present

## 2020-10-19 LAB — GLUCOSE, CAPILLARY
Glucose-Capillary: 154 mg/dL — ABNORMAL HIGH (ref 70–99)
Glucose-Capillary: 141 mg/dL — ABNORMAL HIGH (ref 70–99)
Glucose-Capillary: 214 mg/dL — ABNORMAL HIGH (ref 70–99)
Glucose-Capillary: 206 mg/dL — ABNORMAL HIGH (ref 70–99)
Glucose-Capillary: 162 mg/dL — ABNORMAL HIGH (ref 70–99)

## 2020-10-19 MED ORDER — SENNOSIDES-DOCUSATE SODIUM 8.6-50 MG PO TABS
1.0000 | ORAL_TABLET | Freq: Two times a day (BID) | ORAL | Status: DC
  Administered 2020-10-19 – 2020-11-17 (×58): 1 via ORAL
  Filled 2020-10-19 (×58): qty 1

## 2020-10-19 MED ORDER — AMLODIPINE BESYLATE 10 MG PO TABS
10.0000 mg | ORAL_TABLET | Freq: Every day | ORAL | Status: DC
  Administered 2020-10-20 – 2020-11-17 (×29): 10 mg via ORAL
  Filled 2020-10-19 (×29): qty 1

## 2020-10-19 MED ORDER — LIVING WELL WITH DIABETES BOOK
Freq: Once | Status: AC
  Filled 2020-10-19: qty 1

## 2020-10-19 MED ORDER — INSULIN ASPART 100 UNIT/ML IJ SOLN
0.0000 [IU] | Freq: Three times a day (TID) | INTRAMUSCULAR | Status: DC
  Administered 2020-10-19 – 2020-10-20 (×2): 5 [IU] via SUBCUTANEOUS
  Administered 2020-10-20 – 2020-10-21 (×4): 3 [IU] via SUBCUTANEOUS
  Administered 2020-10-21: 5 [IU] via SUBCUTANEOUS
  Administered 2020-10-22: 11 [IU] via SUBCUTANEOUS
  Administered 2020-10-22: 3 [IU] via SUBCUTANEOUS
  Administered 2020-10-23: 2 [IU] via SUBCUTANEOUS
  Administered 2020-10-23: 3 [IU] via SUBCUTANEOUS
  Administered 2020-10-23: 5 [IU] via SUBCUTANEOUS
  Administered 2020-10-24 (×2): 2 [IU] via SUBCUTANEOUS
  Administered 2020-10-24: 8 [IU] via SUBCUTANEOUS
  Administered 2020-10-25: 11 [IU] via SUBCUTANEOUS
  Administered 2020-10-25: 2 [IU] via SUBCUTANEOUS
  Administered 2020-10-25 – 2020-10-26 (×2): 3 [IU] via SUBCUTANEOUS
  Administered 2020-10-26 – 2020-10-27 (×2): 2 [IU] via SUBCUTANEOUS
  Administered 2020-10-27: 3 [IU] via SUBCUTANEOUS
  Administered 2020-10-28: 11 [IU] via SUBCUTANEOUS
  Administered 2020-10-28: 5 [IU] via SUBCUTANEOUS
  Administered 2020-10-28: 3 [IU] via SUBCUTANEOUS
  Administered 2020-10-29 (×2): 2 [IU] via SUBCUTANEOUS
  Administered 2020-10-30: 5 [IU] via SUBCUTANEOUS
  Administered 2020-10-30 – 2020-11-01 (×3): 2 [IU] via SUBCUTANEOUS
  Administered 2020-11-01: 11 [IU] via SUBCUTANEOUS
  Administered 2020-11-01: 3 [IU] via SUBCUTANEOUS
  Administered 2020-11-02: 2 [IU] via SUBCUTANEOUS
  Administered 2020-11-02: 5 [IU] via SUBCUTANEOUS
  Administered 2020-11-03 – 2020-11-04 (×2): 8 [IU] via SUBCUTANEOUS
  Administered 2020-11-05 (×2): 3 [IU] via SUBCUTANEOUS
  Administered 2020-11-06: 2 [IU] via SUBCUTANEOUS
  Administered 2020-11-06: 3 [IU] via SUBCUTANEOUS
  Administered 2020-11-06 – 2020-11-07 (×2): 5 [IU] via SUBCUTANEOUS
  Administered 2020-11-07 – 2020-11-08 (×2): 3 [IU] via SUBCUTANEOUS
  Administered 2020-11-08: 5 [IU] via SUBCUTANEOUS
  Administered 2020-11-09: 11 [IU] via SUBCUTANEOUS
  Administered 2020-11-10: 3 [IU] via SUBCUTANEOUS
  Administered 2020-11-10: 2 [IU] via SUBCUTANEOUS
  Administered 2020-11-10 – 2020-11-11 (×2): 5 [IU] via SUBCUTANEOUS
  Administered 2020-11-11 – 2020-11-12 (×3): 3 [IU] via SUBCUTANEOUS
  Administered 2020-11-12: 5 [IU] via SUBCUTANEOUS
  Administered 2020-11-13: 2 [IU] via SUBCUTANEOUS
  Administered 2020-11-13: 3 [IU] via SUBCUTANEOUS
  Administered 2020-11-14: 2 [IU] via SUBCUTANEOUS
  Administered 2020-11-14: 3 [IU] via SUBCUTANEOUS
  Administered 2020-11-14: 5 [IU] via SUBCUTANEOUS
  Administered 2020-11-15: 0 [IU] via SUBCUTANEOUS
  Administered 2020-11-15: 8 [IU] via SUBCUTANEOUS
  Administered 2020-11-15 – 2020-11-16 (×2): 3 [IU] via SUBCUTANEOUS

## 2020-10-19 MED ORDER — PANTOPRAZOLE SODIUM 40 MG PO TBEC
40.0000 mg | DELAYED_RELEASE_TABLET | Freq: Every day | ORAL | Status: DC
  Administered 2020-10-19 – 2020-11-16 (×29): 40 mg via ORAL
  Filled 2020-10-19 (×29): qty 1

## 2020-10-19 MED ORDER — ACETAMINOPHEN 160 MG/5ML PO SOLN
650.0000 mg | ORAL | Status: DC | PRN
  Administered 2020-10-24 – 2020-11-10 (×22): 650 mg
  Filled 2020-10-19 (×23): qty 20.3

## 2020-10-19 MED ORDER — TROLAMINE SALICYLATE 10 % EX CREA: TOPICAL_CREAM | Freq: Three times a day (TID) | CUTANEOUS | Status: DC

## 2020-10-19 MED ORDER — PANTOPRAZOLE SODIUM 40 MG IV SOLR: 40.0000 mg | Freq: Every day | INTRAVENOUS | Status: DC

## 2020-10-19 MED ORDER — ACETAMINOPHEN 325 MG PO TABS
650.0000 mg | ORAL_TABLET | ORAL | Status: DC | PRN
  Administered 2020-10-19 – 2020-11-15 (×49): 650 mg via ORAL
  Filled 2020-10-19 (×52): qty 2

## 2020-10-19 MED ORDER — INSULIN ASPART 100 UNIT/ML IJ SOLN
7.0000 [IU] | Freq: Three times a day (TID) | INTRAMUSCULAR | Status: DC
  Administered 2020-10-19 – 2020-10-22 (×8): 7 [IU] via SUBCUTANEOUS

## 2020-10-19 MED ORDER — ATORVASTATIN CALCIUM 10 MG PO TABS
20.0000 mg | ORAL_TABLET | Freq: Every day | ORAL | Status: DC
  Administered 2020-10-19 – 2020-11-17 (×30): 20 mg via ORAL
  Filled 2020-10-19 (×30): qty 2

## 2020-10-19 MED ORDER — ACETAMINOPHEN 650 MG RE SUPP
650.0000 mg | RECTAL | Status: DC | PRN
  Filled 2020-10-19: qty 1

## 2020-10-19 MED ORDER — BLOOD PRESSURE CONTROL BOOK
Freq: Once | Status: AC
  Filled 2020-10-19: qty 1

## 2020-10-19 MED ORDER — MUSCLE RUB 10-15 % EX CREA
TOPICAL_CREAM | Freq: Three times a day (TID) | CUTANEOUS | Status: DC
  Administered 2020-10-20 – 2020-11-10 (×16): 1 via TOPICAL
  Filled 2020-10-19 (×2): qty 85

## 2020-10-19 MED ORDER — AMLODIPINE BESYLATE 10 MG PO TABS: 10.0000 mg | ORAL_TABLET | Freq: Every day | ORAL | Status: DC

## 2020-10-19 MED ORDER — INSULIN DETEMIR 100 UNIT/ML ~~LOC~~ SOLN
22.0000 [IU] | Freq: Every day | SUBCUTANEOUS | Status: DC
  Administered 2020-10-19 – 2020-11-03 (×16): 22 [IU] via SUBCUTANEOUS
  Filled 2020-10-19 (×17): qty 0.22

## 2020-10-19 NOTE — H&P (Signed)
Physical Medicine and Rehabilitation Admission H&P          Chief Complaint  Patient presents with  . Intraparenchymal hemorrhage  : HPI: Lance [name]. Lance [name] is a 75 year old right-handed male with history of diabetes mellitus, hepatitis C and schizophrenia as well as alcohol and polysubstance use.  Per chart review patient lives alone.  1 level home 3 steps to entry.  Used a Rollator for the past few weeks due to gait dysfunction.  Presented 10/14/2020 with weakness x2 weeks especially left lower extremity with gait disorder.  CT of the head showed a 3.4 x 1.7 x 2.8 acute right frontal parietal parenchymal bleed.  CT cervical spine negative.  MRI follow-up showed stable intra-axial hemorrhage in the right superior frontal gyrus motor strip.  Stable right anterior frontal convexity en plaque meningioma since December with comparison of prior imaging.  Small bilateral subdural hematomas resolved since earlier tracings of December.  Prior MRIs were also suggestive of chronic microangiopathy and mild hemorrhages concerning for possible amyloid angiopathy.  Echocardiogram with ejection fraction of 70 to [id number] grade 1 diastolic dysfunction.  Admission chemistries unremarkable except glucose 167 alcohol negative urine drug screen positive cocaine.  Neurology follow-up conservative care close monitoring of blood pressure.  Tolerating a regular consistency diet.  Due to patient's decreased functional ability was admitted for a comprehensive rehab program.   Pt reports neck and back pain- new pain and really painful last 2-3 days.  Tylenol does nothing.  LBM 2 days ok- no constipation Sx's. Voiding OK with condom catheter.      Review of Systems  Constitutional: Negative for chills and fever.  HENT: Negative for hearing loss.   Eyes: Negative for blurred vision and double vision.  Respiratory: Negative for cough and shortness of breath.   Cardiovascular: Negative for chest pain and leg swelling.   Gastrointestinal: Positive for constipation. Negative for heartburn, nausea and vomiting.  Genitourinary: Negative for dysuria, flank pain and hematuria.  Musculoskeletal: Positive for falls and myalgias.  Skin: Negative for rash.  Psychiatric/Behavioral:       Schizophrenia  All other systems reviewed and are negative.          Past Medical History:  Diagnosis Date  . Diabetes mellitus without complication ([place])    . Hepatitis C    . Schizophrenia [facility])               Past Surgical History:  Procedure Laterality Date  . COLONOSCOPY WITH PROPOFOL N/A 08/15/2020    Procedure: COLONOSCOPY WITH PROPOFOL;  Surgeon: Lucilla Lame, MD;  Location: [facility] ENDOSCOPY;  Service: Endoscopy;  Laterality: N/A;  . ESOPHAGOGASTRODUODENOSCOPY (EGD) WITH PROPOFOL N/A 10/10/2016    Procedure: ESOPHAGOGASTRODUODENOSCOPY (EGD) WITH PROPOFOL;  Surgeon: Lucilla Lame, MD;  Location: [facility] ENDOSCOPY;  Service: Endoscopy;  Laterality: N/A;  . HERNIA REPAIR      . VISCERAL ARTERY INTERVENTION N/A 10/11/2016    Procedure: Visceral Artery Intervention;  Surgeon: Algernon Huxley, MD;  Location: [place] [facility] LAB;  Service: Cardiovascular;  Laterality: N/A;             Family History  Problem Relation Age of Onset  . Diabetes Sister      Social History:  reports that he has been smoking. He has a 2.50 pack-year smoking history. He has never used smokeless tobacco. He reports previous alcohol use of about 10.0 standard drinks of alcohol per week. He reports that he does not use drugs. Allergies: No  Known Allergies            Medications Prior to Admission  Medication Sig Dispense Refill  . acetaminophen (TYLENOL) 650 MG CR tablet Take 650 mg by mouth every 8 (eight) hours as needed for pain.      Marland Kitchen albuterol (VENTOLIN HFA) 108 (90 Base) MCG/ACT inhaler Inhale 2 puffs into the lungs every 6 (six) hours as needed for wheezing or shortness of breath. 18 g 0  . atorvastatin (LIPITOR) 20 MG tablet Take 20 mg by mouth daily.       . citalopram (CELEXA) 10 MG tablet Take 1 tablet (10 mg total) by mouth daily. 30 tablet 0  . diclofenac Sodium (VOLTAREN) 1 % GEL Apply 2 g topically daily as needed (For pain).      . feeding supplement (ENSURE ENLIVE / ENSURE PLUS) LIQD Take 237 mLs by mouth 3 (three) times daily between meals. 21330 mL 0  . ferrous sulfate 325 (65 FE) MG tablet Take 1 tablet (325 mg total) by mouth daily with breakfast. 30 tablet 0  . gabapentin (NEURONTIN) 300 MG capsule Take 1 capsule by mouth 3 (three) times daily.      . insulin aspart (NOVOLOG) 100 UNIT/ML FlexPen 4 units subcutaneous injection with meals (okay to substitute any short acting insulin pen that is covered) (Patient taking differently: Inject 4 Units into the skin 3 (three) times daily with meals. 4 units subcutaneous injection with meals (okay to substitute any short acting insulin pen that is covered)) 15 mL 0  . insulin detemir (LEVEMIR) 100 UNIT/ML FlexPen Inject 18 Units into the skin at bedtime. 15 mL 1  . Insulin Pen Needle 34G X 3.5 MM MISC 1 Dose by Does not apply route 4 (four) times daily -  before meals and at bedtime. 200 each 0  . Multiple Vitamin (MULTI-VITAMIN) tablet Take 1 tablet by mouth daily.      Marland Kitchen OLANZapine (ZYPREXA) 5 MG tablet Take 5 mg by mouth daily.      . polyethylene glycol (MIRALAX / GLYCOLAX) 17 g packet Take 17 g by mouth daily as needed for mild constipation. 30 each 0  . polyvinyl alcohol (ARTIFICIAL TEARS) 1.4 % ophthalmic solution Place 1 drop into both eyes as needed for dry eyes. (Patient taking differently: Place 1 drop into both eyes daily as needed for dry eyes.) 15 mL 0  . sucralfate (CARAFATE) 1 g tablet Take 1 g by mouth daily.      . tamsulosin (FLOMAX) 0.4 MG CAPS capsule Take 2 capsules (0.8 mg total) by mouth daily. 60 capsule 0  . thiamine 100 MG tablet Take 1 tablet (100 mg total) by mouth daily. 30 tablet 0  . vitamin B-12 1000 MCG tablet Take 1 tablet (1,000 mcg total) by mouth daily. 30  tablet 0      Drug Regimen Review Drug regimen was reviewed and remains appropriate with no significant issues identified   Home: Home Living Family/patient expects to be discharged to:: Private residence Living Arrangements: Alone Available Help at Discharge: Family,Available 24 hours/day Type of Home: House Home Access: Stairs to enter [employer] of Steps: 4 Entrance Stairs-Rails: Right,Left,Can reach both Home Layout: One level Bathroom Shower/Tub: Chiropodist: Standard Bathroom Accessibility: Yes Home Equipment: Walker - 4 wheels,Cane - single point   Functional History: Prior Function Level of Independence: Needs assistance Gait / Transfers Assistance Needed: Using Rollator in past couple weeks when ambulatory dysfunction occurred ADL's / Homemaking Assistance Needed:  Pt family drives him to grocery shopping, appointment Comments: Poor historian   Functional Status:  Mobility: Bed Mobility Overal bed mobility: Needs Assistance Bed Mobility: Supine to Sit Supine to sit: Max assist,+2 for physical assistance Sit to supine: Max assist General bed mobility comments: Pt with difficulty sequencing through moving to R side of bed; assist with rolling to R and trunk elevation with helicopter method to EOB. Pt requiring assist for static sitting at EOB due to L lateral lean Transfers Overall transfer level: Needs assistance Equipment used: None,Ambulation equipment used Transfers: Sit to/from Stand Sit to Stand: Max assist,+2 physical assistance,+2 safety/equipment General transfer comment: maxA +2 for knee blocking for standing x2 times and unable to take steps; LLE not bearing any weight. Pt usign stedy for transfer to recliner with maxA+2 for sit to stand from stedy with heavy L lateral lean.   ADL: ADL Overall ADL's : Needs assistance/impaired Eating/Feeding: Set up,Sitting Grooming: Minimal assistance,Sitting Grooming Details (indicate  cue type and reason): supported sitting using RUE Upper Body Bathing: Moderate assistance,Sitting Lower Body Bathing: Maximal assistance,Sitting/lateral leans,Sit to/from stand Upper Body Dressing : Moderate assistance,Sitting Lower Body Dressing: Maximal assistance,Sitting/lateral leans,Sit to/from stand,+2 for safety/equipment,+2 for physical assistance Toilet Transfer: Maximal assistance,+2 for physical assistance,+2 for safety/equipment Toilet Transfer Details (indicate cue type and reason): use of stedy maxA+2 Toileting- Clothing Manipulation and Hygiene: Total assistance Functional mobility during ADLs: Maximal assistance,+2 for physical assistance,+2 for safety/equipment,Cueing for safety,Cueing for sequencing General ADL Comments: Pt limited by L inattention, blurriness of vision, decreased strength on L side, decreased activity tolerance and decreased ability to care for self. Pt R handed and using RUE for all grooming tasks at EOB with assist due to heavy L sided lean.   Cognition: Cognition Overall Cognitive Status: Impaired/Different from baseline Orientation Level: Oriented to person,Oriented to place,Oriented to situation,Disoriented to time Cognition Arousal/Alertness: Awake/alert Behavior During Therapy: Flat affect Overall Cognitive Status: Impaired/Different from baseline Area of Impairment: Orientation,Attention,Memory,Following commands,Safety/judgement,Awareness,Problem solving Orientation Level: Disoriented to,Place,Time,Situation Current Attention Level: Focused Memory: Decreased short-term memory Following Commands: Follows one step commands inconsistently,Follows one step commands with increased time Safety/Judgement: Decreased awareness of deficits,Decreased awareness of safety Awareness: Emergent Problem Solving: Slow processing,Difficulty sequencing,Requires verbal cues,Requires tactile cues General Comments: Pt oriented to self and hospital, able to state month  with choices and year after given answer. Pt able to recall year after 10 mins. Pt perform Short Blessed Test, but was unable to attempt counting 20 to 1 and naming the months of the year in reverse order  scoring  >10 consistent with dementia, but would require cognitive evaluation for further information. Pt following 1 step commands with moderate cues to attend to task as pt with poor ability to sequence through with focused attention and emergent awareness. Pt stating "something ain't right on my left side."   Physical Exam: Blood pressure (!) 160/85, pulse 68, temperature 99.1 F (37.3 C), temperature source Oral, resp. rate 19, height 5\' 9"  (1.753 m), weight 77 kg, SpO2 100 %. Physical Exam Vitals and nursing note reviewed.  Constitutional:      Comments: Pt sitting up in bed- finished 100% lunch; spilled some on face/beard; staring straight ahead; RN in and out, NAD  HENT:     Head: Normocephalic and atraumatic.     Comments: L facial droop- mild Tongue midline    Right Ear: External ear normal.     Left Ear: External ear normal.     Nose: Nose normal. No congestion.  Mouth/Throat:     Mouth: Mucous membranes are moist.     Pharynx: Oropharynx is clear. No oropharyngeal exudate.  Eyes:     General:        Right eye: No discharge.        Left eye: No discharge.     Comments: R gaze preference; can look to left with continued direction- no nystagmus seen  Neck:     Comments: C/o neck pain- tight scalenes and upper traps B/L Cardiovascular:     Rate and Rhythm: Normal rate and regular rhythm.     Heart sounds: Normal heart sounds. No murmur heard. No gallop.   Pulmonary:     Comments: CTA B/L- no W/R/R- good air movement Abdominal:     Comments: Soft, NT, ND, (+)BS    Genitourinary:    Comments: Condom catheter in place- light amber urine Musculoskeletal:     Comments: RUE 5/5 LUE- biceps ?1/5, Triceps 2/5, WE 4-/5, grip 3/5, and FA 2/5 RLE 5/5 LLE- 0/5 except 1/5 in  PF Severely impacted by L inattention  Skin:    General: Skin is warm and dry.     Comments: No skin breakdown on backside Bunion on feet medially IV sites B/L- look OK. Didn't see actual IV, just where they were removed, per nurse No heel breakdown    Neurological:     Comments: Patient is alert no acute distress.  Dysarthric speech but intelligible.  Provides his name but not place or time.  Follows simple commands.       Lab Results Last 48 Hours             Results for orders placed or performed during the hospital encounter of 10/14/20 (from the past 48 hour(s))  Glucose, capillary     Status: Abnormal    Collection Time: 10/15/20 11:29 AM  Result Value Ref Range    Glucose-Capillary 480 (H) 70 - 99 mg/dL      Comment: Glucose reference range applies only to samples taken after fasting for at least 8 hours.  Glucose, capillary     Status: Abnormal    Collection Time: 10/15/20 12:38 PM  Result Value Ref Range    Glucose-Capillary 422 (H) 70 - 99 mg/dL      Comment: Glucose reference range applies only to samples taken after fasting for at least 8 hours.  Glucose, capillary     Status: Abnormal    Collection Time: 10/15/20  1:33 PM  Result Value Ref Range    Glucose-Capillary 254 (H) 70 - 99 mg/dL      Comment: Glucose reference range applies only to samples taken after fasting for at least 8 hours.  Glucose, capillary     Status: Abnormal    Collection Time: 10/15/20  2:34 PM  Result Value Ref Range    Glucose-Capillary 142 (H) 70 - 99 mg/dL      Comment: Glucose reference range applies only to samples taken after fasting for at least 8 hours.  Glucose, capillary     Status: None    Collection Time: 10/15/20  3:33 PM  Result Value Ref Range    Glucose-Capillary 81 70 - 99 mg/dL      Comment: Glucose reference range applies only to samples taken after fasting for at least 8 hours.  Glucose, capillary     Status: Abnormal    Collection Time: 10/15/20  4:25 PM  Result  Value Ref Range    Glucose-Capillary 109 (H) 70 -  99 mg/dL      Comment: Glucose reference range applies only to samples taken after fasting for at least 8 hours.  Glucose, capillary     Status: Abnormal    Collection Time: 10/15/20  6:02 PM  Result Value Ref Range    Glucose-Capillary 216 (H) 70 - 99 mg/dL      Comment: Glucose reference range applies only to samples taken after fasting for at least 8 hours.  Glucose, capillary     Status: Abnormal    Collection Time: 10/15/20  9:20 PM  Result Value Ref Range    Glucose-Capillary 186 (H) 70 - 99 mg/dL      Comment: Glucose reference range applies only to samples taken after fasting for at least 8 hours.  CBC     Status: Abnormal    Collection Time: 10/16/20  4:23 AM  Result Value Ref Range    WBC 10.8 (H) 4.0 - 10.5 K/uL    RBC 4.64 4.22 - 5.81 MIL/uL    Hemoglobin 14.1 13.0 - 17.0 g/dL    HCT 43.4 39.0 - 52.0 %    MCV 93.5 80.0 - 100.0 fL    MCH 30.4 26.0 - 34.0 pg    MCHC 32.5 30.0 - 36.0 g/dL    RDW 13.0 11.5 - 15.5 %    Platelets 223 150 - 400 K/uL    nRBC 0.0 0.0 - 0.2 %      Comment: Performed at [facility], [place] [address], [place], [place] [id number]  Basic metabolic panel     Status: Abnormal    Collection Time: 10/16/20  4:23 AM  Result Value Ref Range    Sodium 136 135 - 145 mmol/L    Potassium 3.9 3.5 - 5.1 mmol/L    Chloride 103 98 - 111 mmol/L    CO2 24 22 - 32 mmol/L    Glucose, Bld 201 (H) 70 - 99 mg/dL      Comment: Glucose reference range applies only to samples taken after fasting for at least 8 hours.    BUN 15 8 - 23 mg/dL    Creatinine, Ser 0.83 0.61 - 1.24 mg/dL    Calcium 8.6 (L) 8.9 - 10.3 mg/dL    GFR, Estimated >60 >60 mL/min      Comment: (NOTE) Calculated using the CKD-EPI Creatinine Equation (2021)      Anion gap 9 5 - 15      Comment: Performed at [facility] [address], [place], [place] [address]  Glucose, capillary     Status: Abnormal    Collection Time: 10/16/20  7:33  AM  Result Value Ref Range    Glucose-Capillary 222 (H) 70 - 99 mg/dL      Comment: Glucose reference range applies only to samples taken after fasting for at least 8 hours.  Glucose, capillary     Status: Abnormal    Collection Time: 10/16/20 11:32 AM  Result Value Ref Range    Glucose-Capillary 272 (H) 70 - 99 mg/dL      Comment: Glucose reference range applies only to samples taken after fasting for at least 8 hours.  Glucose, capillary     Status: Abnormal    Collection Time: 10/16/20  5:14 PM  Result Value Ref Range    Glucose-Capillary 135 (H) 70 - 99 mg/dL      Comment: Glucose reference range applies only to samples taken after fasting for at least 8 hours.  Glucose,  capillary     Status: Abnormal    Collection Time: 10/16/20  9:48 PM  Result Value Ref Range    Glucose-Capillary 347 (H) 70 - 99 mg/dL      Comment: Glucose reference range applies only to samples taken after fasting for at least 8 hours.  CBC     Status: None    Collection Time: 10/17/20  3:47 AM  Result Value Ref Range    WBC 9.4 4.0 - 10.5 K/uL    RBC 4.51 4.22 - 5.81 MIL/uL    Hemoglobin 13.6 13.0 - 17.0 g/dL    HCT 42.3 39.0 - 52.0 %    MCV 93.8 80.0 - 100.0 fL    MCH 30.2 26.0 - 34.0 pg    MCHC 32.2 30.0 - 36.0 g/dL    RDW 12.9 11.5 - 15.5 %    Platelets 198 150 - 400 K/uL    nRBC 0.0 0.0 - 0.2 %      Comment: Performed at [facility], [place] [address] [place], [place] [id number]  Basic metabolic panel     Status: Abnormal    Collection Time: 10/17/20  3:47 AM  Result Value Ref Range    Sodium 136 135 - 145 mmol/L    Potassium 3.9 3.5 - 5.1 mmol/L    Chloride 104 98 - 111 mmol/L    CO2 26 22 - 32 mmol/L    Glucose, Bld 194 (H) 70 - 99 mg/dL      Comment: Glucose reference range applies only to samples taken after fasting for at least 8 hours.    BUN 17 8 - 23 mg/dL    Creatinine, Ser 0.87 0.61 - 1.24 mg/dL    Calcium 8.6 (L) 8.9 - 10.3 mg/dL    GFR, Estimated >60 >60 mL/min      Comment:  (NOTE) Calculated using the CKD-EPI Creatinine Equation (2021)      Anion gap 6 5 - 15      Comment: Performed at [facility] [address] [place], [place] [address]  Glucose, capillary     Status: Abnormal    Collection Time: 10/17/20  7:18 AM  Result Value Ref Range    Glucose-Capillary 215 (H) 70 - 99 mg/dL      Comment: Glucose reference range applies only to samples taken after fasting for at least 8 hours.        Imaging Results (Last 48 hours)  MR BRAIN W WO CONTRAST   Result Date: 10/15/2020 CLINICAL DATA:  75 year old male with superior right hemisphere intra-axial hemorrhage on presentation head CT for left leg weakness. Suspicion of amyloid angiopathy, and also demonstration of a 6 cm right anterior frontal convexity meningioma invading and occluding the anterior segment of the superior sagittal sinus on December MRI. EXAM: MRI HEAD WITHOUT AND WITH CONTRAST TECHNIQUE: Multiplanar, multiecho pulse sequences of the brain and surrounding structures were obtained without and with intravenous contrast. CONTRAST:  7.4mL GADAVIST GADOBUTROL 1 MMOL/ML IV SOLN COMPARISON:  Head and cervical spine CT 10/14/2020. Brain MRI 05/15/2020. FINDINGS: Brain: Right superior frontal gyrus motor strip intra-axial hemorrhage encompassing 41 by 22 x 33 mm (AP by transverse by CC), estimated volume 15 mL) with mostly hyperintense T2 and isointense T1 signal. Surrounding edema is contiguous with other chronic white matter T2 and FLAIR hyperintensity. There is only mild regional mass effect. Abnormal diffusion associated with the blood products, no larger area of restricted diffusion. No contrast enhancement. No diffusion  restriction elsewhere in the brain. The previous small bilateral subdural hematomas seen in December appear resolved. Superimposed anterior right frontal convexity en plaque type meningioma appears stable and size and configuration since December, roughly 6.6 cm long axis, 1.9 cm  thickness. Stable mass effect on the underlying brain with no convincing cerebral edema. Innumerable micro hemorrhages on SWI redemonstrated in addition to the acute parenchymal hemorrhage (series 14, image 43). Stable gray and white matter signal elsewhere. No ventriculomegaly or intraventricular hemorrhage. Basilar cisterns remain normal. Negative pituitary and cervicomedullary junction. No other abnormal intracranial enhancement. Vascular: Major intracranial vascular flow voids are stable since December, including loss of the anterior but preserved posterior superior sagittal sinus dural venous flow void. And the other major dural venous sinuses are enhancing and appear to be patent following contrast today. Skull and upper cervical spine: Stable, negative for age. Sinuses/Orbits: Stable, negative. Other: Mastoids remain clear. Visible internal auditory structures appear normal. IMPRESSION: 1. Stable intra-axial hemorrhage in the right superior frontal gyrus/motor strip since yesterday, estimated volume 15 mL. 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 Mild surrounding edema. No significant mass effect or other complicating features. 2. Stable right anterior frontal convexity en plaque Meningioma since December. Anterior segment of the superior sagittal sinus occluded as before but the other dural venous sinuses remain patent. 3. Small bilateral subdural hematomas have resolved since December. Electronically Signed   By: Genevie Ann M.D.   On: 10/15/2020 11:28    ECHOCARDIOGRAM COMPLETE   Result Date: 10/15/2020    ECHOCARDIOGRAM REPORT   Patient Name:   ANTWAINE BOOMHOWER Date of Exam: 10/15/2020 Medical Rec #:  [id number]        Height:       69.0 in Accession #:    [id number]       Weight:       169.8 lb Date of Birth:  September 06, 1945        BSA:          1.927 m Patient Age:    14 years         BP:           122/77 mmHg Patient Gender: M                HR:           83 bpm.  Exam Location:  Inpatient Procedure: 2D Echo Indications:    stroke  History:        Patient has prior history of Echocardiogram examinations, most                 recent 05/15/2020. Hepatitis C; Risk Factors:Diabetes and IV drug                 use.  Sonographer:    Johny Chess Referring Phys: [id number] ASHISH ARORA IMPRESSIONS  1. Left ventricular ejection fraction, by estimation, is 70 to 75%. The left ventricle has hyperdynamic function. The left ventricle has no regional wall motion abnormalities. There is mild concentric left ventricular hypertrophy. Left ventricular diastolic parameters are consistent with Grade I diastolic dysfunction (impaired relaxation).  2. Right ventricular systolic function is normal. The right ventricular size is normal.  3. The mitral valve is normal in structure. No evidence of mitral valve regurgitation. No evidence of mitral stenosis.  4. The aortic valve is normal in structure. Aortic valve regurgitation is not visualized. No aortic stenosis is present.  5. The inferior vena cava  is normal in size with greater than 50% respiratory variability, suggesting right atrial pressure of 3 mmHg. FINDINGS  Left Ventricle: Left ventricular ejection fraction, by estimation, is 70 to 75%. The left ventricle has hyperdynamic function. The left ventricle has no regional wall motion abnormalities. The left ventricular internal cavity size was normal in size. There is mild concentric left ventricular hypertrophy. Left ventricular diastolic parameters are consistent with Grade I diastolic dysfunction (impaired relaxation). Right Ventricle: The right ventricular size is normal. No increase in right ventricular wall thickness. Right ventricular systolic function is normal. Left Atrium: Left atrial size was normal in size. Right Atrium: Right atrial size was normal in size. Prominent Eustachian valve. Pericardium: There is no evidence of pericardial effusion. Mitral Valve: The mitral valve is  normal in structure. No evidence of mitral valve regurgitation. No evidence of mitral valve stenosis. Tricuspid Valve: The tricuspid valve is normal in structure. Tricuspid valve regurgitation is not demonstrated. No evidence of tricuspid stenosis. Aortic Valve: The aortic valve is normal in structure. Aortic valve regurgitation is not visualized. No aortic stenosis is present. Pulmonic Valve: The pulmonic valve was normal in structure. Pulmonic valve regurgitation is not visualized. No evidence of pulmonic stenosis. Aorta: The aortic root is normal in size and structure. Venous: The inferior vena cava is normal in size with greater than 50% respiratory variability, suggesting right atrial pressure of 3 mmHg. IAS/Shunts: No atrial level shunt detected by color flow Doppler.  LEFT VENTRICLE PLAX 2D LVIDd:         4.20 cm  Diastology LVIDs:         2.80 cm  LV e' medial:    6.20 cm/s LV PW:         1.40 cm  LV E/e' medial:  8.6 LV IVS:        1.40 cm  LV e' lateral:   9.25 cm/s LVOT diam:     2.10 cm  LV E/e' lateral: 5.8 LVOT Area:     3.46 cm  RIGHT VENTRICLE             IVC RV S prime:     18.50 cm/s  IVC diam: 1.70 cm TAPSE (M-mode): 2.3 cm LEFT ATRIUM             Index       RIGHT ATRIUM           Index LA diam:        3.40 cm 1.76 cm/m  RA Area:     16.70 cm LA Vol (A2C):   56.4 ml 29.27 ml/m RA Volume:   42.60 ml  22.11 ml/m LA Vol (A4C):   36.1 ml 18.73 ml/m LA Biplane Vol: 46.1 ml 23.92 ml/m   AORTA Ao Root diam: 3.20 cm MITRAL VALVE MV Area (PHT): 2.56 cm    SHUNTS MV Decel Time: 296 msec    Systemic Diam: 2.10 cm MV E velocity: 53.60 cm/s MV A velocity: 75.40 cm/s MV E/A ratio:  0.71 Mihai Croitoru MD Electronically signed by Sanda Klein MD Signature Date/Time: 10/15/2020/3:53:16 PM    Final              Medical Problem List and Plan: 1.  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  Antithrombotics: -DVT/anticoagulation: SCDs             -antiplatelet therapy: N/A 3. Pain Management: Tylenol as needed -pt reports tylenol doesn't work - suggest Tramadol? 4. Mood/schizophrenia: Provide emotional support             -antipsychotic agents: N/A 5. Neuropsych: This patient is capable of making decisions on his own behalf. 6. Skin/Wound Care: Routine skin checks 7. Fluids/Electrolytes/Nutrition: Routine in and outs with follow-up chemistries 8.  Hypertension.  Norvasc 10 mg daily 9.  Diabetes mellitus.  Hemoglobin A1c 10.1.  NovoLog 7 units 3 times daily, Levemir 22 units nightly 10.  History of alcohol polysubstance use.  Urine drug screen positive cocaine.  Provide counseling 11.  History of hepatitis C.  Follow-up outpatient 12. L foot drop- suggest possible AFO, etc and PRAFO to reduce risk of L ankle contracture 13. L inattention- per primary team   Cathlyn Parsons, PA-C 10/17/2020    I have personally performed a face to face diagnostic evaluation of this patient and formulated the key components of the plan.  Additionally, I have personally reviewed laboratory data, imaging studies, as well as relevant notes and concur with the physician assistant's documentation above.

## 2020-10-19 NOTE — Progress Notes (Signed)
IP rehab admissions - I spoke with patient's daughter this am.  She and family are agreeable to CIR.  I have clearance from attending MD to admit to CIR.  Bed available and will admit to inpatient rehab today.  Call for questions.  [phone]

## 2020-10-19 NOTE — H&P (Signed)
Physical Medicine and Rehabilitation Admission H&P        Chief Complaint  Patient presents with  . Intraparenchymal hemorrhage  : HPI: Lance [name] is a 75 year old right-handed male with history of diabetes mellitus, hepatitis C and schizophrenia as well as alcohol and polysubstance use.  Per chart review patient lives alone.  1 level home 3 steps to entry.  Used a Rollator for the past few weeks due to gait dysfunction.  Presented 10/14/2020 with weakness x2 weeks especially left lower extremity with gait disorder.  CT of the head showed a 3.4 x 1.7 x 2.8 acute right frontal parietal parenchymal bleed.  CT cervical spine negative.  MRI follow-up showed stable intra-axial hemorrhage in the right superior frontal gyrus motor strip.  Stable right anterior frontal convexity en plaque meningioma since December with comparison of prior imaging.  Small bilateral subdural hematomas resolved since earlier tracings of December.  Prior MRIs were also suggestive of chronic microangiopathy and mild hemorrhages concerning for possible amyloid angiopathy.  Echocardiogram with ejection fraction of 70 to [id number] grade 1 diastolic dysfunction.  Admission chemistries unremarkable except glucose 167 alcohol negative urine drug screen positive cocaine.  Neurology follow-up conservative care close monitoring of blood pressure.  Tolerating a regular consistency diet.  Due to patient's decreased functional ability was admitted for a comprehensive rehab program.   Pt reports neck and back pain- new pain and really painful last 2-3 days.  Tylenol does nothing.  LBM 2 days ok- no constipation Sx's. Voiding OK with condom catheter.      Review of Systems  Constitutional: Negative for chills and fever.  HENT: Negative for hearing loss.   Eyes: Negative for blurred vision and double vision.  Respiratory: Negative for cough and shortness of breath.   Cardiovascular: Negative for chest pain and leg swelling.   Gastrointestinal: Positive for constipation. Negative for heartburn, nausea and vomiting.  Genitourinary: Negative for dysuria, flank pain and hematuria.  Musculoskeletal: Positive for falls and myalgias.  Skin: Negative for rash.  Psychiatric/Behavioral:       Schizophrenia  All other systems reviewed and are negative.       Past Medical History:  Diagnosis Date  . Diabetes mellitus without complication ([place])    . Hepatitis C    . Schizophrenia [facility])           Past Surgical History:  Procedure Laterality Date  . COLONOSCOPY WITH PROPOFOL N/A 08/15/2020    Procedure: COLONOSCOPY WITH PROPOFOL;  Surgeon: Lucilla Lame, MD;  Location: [facility] ENDOSCOPY;  Service: Endoscopy;  Laterality: N/A;  . ESOPHAGOGASTRODUODENOSCOPY (EGD) WITH PROPOFOL N/A 10/10/2016    Procedure: ESOPHAGOGASTRODUODENOSCOPY (EGD) WITH PROPOFOL;  Surgeon: Lucilla Lame, MD;  Location: [facility] ENDOSCOPY;  Service: Endoscopy;  Laterality: N/A;  . HERNIA REPAIR      . VISCERAL ARTERY INTERVENTION N/A 10/11/2016    Procedure: Visceral Artery Intervention;  Surgeon: Algernon Huxley, MD;  Location: [facility];  Service: Cardiovascular;  Laterality: N/A;         Family History  Problem Relation Age of Onset  . Diabetes Sister      Social History:  reports that he has been smoking. He has a 2.50 pack-year smoking history. He has never used smokeless tobacco. He reports previous alcohol use of about 10.0 standard drinks of alcohol per week. He reports that he does not use drugs. Allergies: No Known Allergies       Medications Prior to Admission  Medication Sig Dispense Refill  . acetaminophen (TYLENOL) 650 MG CR tablet Take 650 mg by mouth every 8 (eight) hours as needed for pain.      Marland Kitchen albuterol (VENTOLIN HFA) 108 (90 Base) MCG/ACT inhaler Inhale 2 puffs into the lungs every 6 (six) hours as needed for wheezing or shortness of breath. 18 g 0  . atorvastatin (LIPITOR) 20 MG tablet Take 20 mg by mouth daily.      .  citalopram (CELEXA) 10 MG tablet Take 1 tablet (10 mg total) by mouth daily. 30 tablet 0  . diclofenac Sodium (VOLTAREN) 1 % GEL Apply 2 g topically daily as needed (For pain).      . feeding supplement (ENSURE ENLIVE / ENSURE PLUS) LIQD Take 237 mLs by mouth 3 (three) times daily between meals. 21330 mL 0  . ferrous sulfate 325 (65 FE) MG tablet Take 1 tablet (325 mg total) by mouth daily with breakfast. 30 tablet 0  . gabapentin (NEURONTIN) 300 MG capsule Take 1 capsule by mouth 3 (three) times daily.      . insulin aspart (NOVOLOG) 100 UNIT/ML FlexPen 4 units subcutaneous injection with meals (okay to substitute any short acting insulin pen that is covered) (Patient taking differently: Inject 4 Units into the skin 3 (three) times daily with meals. 4 units subcutaneous injection with meals (okay to substitute any short acting insulin pen that is covered)) 15 mL 0  . insulin detemir (LEVEMIR) 100 UNIT/ML FlexPen Inject 18 Units into the skin at bedtime. 15 mL 1  . Insulin Pen Needle 34G X 3.5 MM MISC 1 Dose by Does not apply route 4 (four) times daily -  before meals and at bedtime. 200 each 0  . Multiple Vitamin (MULTI-VITAMIN) tablet Take 1 tablet by mouth daily.      Marland Kitchen OLANZapine (ZYPREXA) 5 MG tablet Take 5 mg by mouth daily.      . polyethylene glycol (MIRALAX / GLYCOLAX) 17 g packet Take 17 g by mouth daily as needed for mild constipation. 30 each 0  . polyvinyl alcohol (ARTIFICIAL TEARS) 1.4 % ophthalmic solution Place 1 drop into both eyes as needed for dry eyes. (Patient taking differently: Place 1 drop into both eyes daily as needed for dry eyes.) 15 mL 0  . sucralfate (CARAFATE) 1 g tablet Take 1 g by mouth daily.      . tamsulosin (FLOMAX) 0.4 MG CAPS capsule Take 2 capsules (0.8 mg total) by mouth daily. 60 capsule 0  . thiamine 100 MG tablet Take 1 tablet (100 mg total) by mouth daily. 30 tablet 0  . vitamin B-12 1000 MCG tablet Take 1 tablet (1,000 mcg total) by mouth daily. 30 tablet  0      Drug Regimen Review Drug regimen was reviewed and remains appropriate with no significant issues identified   Home: Home Living Family/patient expects to be discharged to:: Private residence Living Arrangements: Alone Available Help at Discharge: Family,Available 24 hours/day Type of Home: House Home Access: Stairs to enter [employer] of Steps: 4 Entrance Stairs-Rails: Right,Left,Can reach both Home Layout: One level Bathroom Shower/Tub: Chiropodist: Standard Bathroom Accessibility: Yes Home Equipment: Walker - 4 wheels,Cane - single point   Functional History: Prior Function Level of Independence: Needs assistance Gait / Transfers Assistance Needed: Using Rollator in past couple weeks when ambulatory dysfunction occurred ADL's / Homemaking Assistance Needed: Pt family drives him to grocery shopping, appointment Comments: Poor historian   Functional Status:  Mobility: Bed  Pt family drives him to grocery shopping, appointment Comments: Poor historian   Functional Status:  Mobility: Bed Mobility Overal bed mobility: Needs Assistance Bed Mobility: Supine to Sit Supine to sit: Max assist,+2 for physical assistance Sit to supine: Max assist General bed mobility comments: Pt with difficulty sequencing through moving to R side of bed; assist with rolling to R and trunk elevation with helicopter method to EOB. Pt requiring assist for static sitting at EOB due to L lateral lean Transfers Overall transfer level: Needs assistance Equipment used: None,Ambulation equipment used Transfers: Sit to/from Stand Sit to Stand: Max assist,+2 physical assistance,+2 safety/equipment General transfer comment: maxA +2 for knee blocking for standing x2 times and unable to take steps; LLE not bearing any weight. Pt usign stedy for transfer to recliner with maxA+2 for sit to stand from stedy with heavy L lateral lean.   ADL: ADL Overall ADL's : Needs assistance/impaired Eating/Feeding: Set up,Sitting Grooming: Minimal assistance,Sitting Grooming Details (indicate  cue type and reason): supported sitting using RUE Upper Body Bathing: Moderate assistance,Sitting Lower Body Bathing: Maximal assistance,Sitting/lateral leans,Sit to/from stand Upper Body Dressing : Moderate assistance,Sitting Lower Body Dressing: Maximal assistance,Sitting/lateral leans,Sit to/from stand,+2 for safety/equipment,+2 for physical assistance Toilet Transfer: Maximal assistance,+2 for physical assistance,+2 for safety/equipment Toilet Transfer Details (indicate cue type and reason): use of stedy maxA+2 Toileting- Clothing Manipulation and Hygiene: Total assistance Functional mobility during ADLs: Maximal assistance,+2 for physical assistance,+2 for safety/equipment,Cueing for safety,Cueing for sequencing General ADL Comments: Pt limited by L inattention, blurriness of vision, decreased strength on L side, decreased activity tolerance and decreased ability to care for self. Pt R handed and using RUE for all grooming tasks at EOB with assist due to heavy L sided lean.   Cognition: Cognition Overall Cognitive Status: Impaired/Different from baseline Orientation Level: Oriented to person,Oriented to place,Oriented to situation,Disoriented to time Cognition Arousal/Alertness: Awake/alert Behavior During Therapy: Flat affect Overall Cognitive Status: Impaired/Different from baseline Area of Impairment: Orientation,Attention,Memory,Following commands,Safety/judgement,Awareness,Problem solving Orientation Level: Disoriented to,Place,Time,Situation Current Attention Level: Focused Memory: Decreased short-term memory Following Commands: Follows one step commands inconsistently,Follows one step commands with increased time Safety/Judgement: Decreased awareness of deficits,Decreased awareness of safety Awareness: Emergent Problem Solving: Slow processing,Difficulty sequencing,Requires verbal cues,Requires tactile cues General Comments: Pt oriented to self and hospital, able to state month  with choices and year after given answer. Pt able to recall year after 10 mins. Pt perform Short Blessed Test, but was unable to attempt counting 20 to 1 and naming the months of the year in reverse order  scoring  >10 consistent with dementia, but would require cognitive evaluation for further information. Pt following 1 step commands with moderate cues to attend to task as pt with poor ability to sequence through with focused attention and emergent awareness. Pt stating "something ain't right on my left side."   Physical Exam: Blood pressure (!) 160/85, pulse 68, temperature 99.1 F (37.3 C), temperature source Oral, resp. rate 19, height 5' 9" (1.753 m), weight 77 kg, SpO2 100 %. Physical Exam Vitals and nursing note reviewed.  Constitutional:      Comments: Pt sitting up in bed- finished 100% lunch; spilled some on face/beard; staring straight ahead; RN in and out, NAD  HENT:     Head: Normocephalic and atraumatic.     Comments: L facial droop- mild Tongue midline    Right Ear: External ear normal.     Left Ear: External ear normal.     Nose: Nose normal. No congestion.       clear. No oropharyngeal exudate.  Eyes:     General:        Right eye: No discharge.        Left eye: No discharge.     Comments: R gaze preference; can look to left with continued direction- no nystagmus seen  Neck:     Comments: C/o neck pain- tight scalenes and upper traps B/L Cardiovascular:     Rate and Rhythm: Normal rate and regular rhythm.     Heart sounds: Normal heart sounds. No murmur heard. No gallop.   Pulmonary:     Comments: CTA B/L- no W/R/R- good air movement Abdominal:     Comments: Soft, NT, ND, (+)BS    Genitourinary:    Comments: Condom catheter in place- light amber urine Musculoskeletal:     Comments: RUE 5/5 LUE- biceps ?1/5, Triceps 2/5, WE 4-/5, grip 3/5, and FA 2/5 RLE 5/5 LLE- 0/5 except 1/5 in  PF Severely impacted by L inattention  Skin:    General: Skin is warm and dry.     Comments: No skin breakdown on backside Bunion on feet medially IV sites B/L- look OK. Didn't see actual IV, just where they were removed, per nurse No heel breakdown    Neurological:     Comments: Patient is alert no acute distress.  Dysarthric speech but intelligible.  Provides his name but not place or time.  Follows simple commands.       Lab Results Last 48 Hours        Results for orders placed or performed during the hospital encounter of 10/14/20 (from the past 48 hour(s))  Glucose, capillary     Status: Abnormal    Collection Time: 10/15/20 11:29 AM  Result Value Ref Range    Glucose-Capillary 480 (H) 70 - 99 mg/dL      Comment: Glucose reference range applies only to samples taken after fasting for at least 8 hours.  Glucose, capillary     Status: Abnormal    Collection Time: 10/15/20 12:38 PM  Result Value Ref Range    Glucose-Capillary 422 (H) 70 - 99 mg/dL      Comment: Glucose reference range applies only to samples taken after fasting for at least 8 hours.  Glucose, capillary     Status: Abnormal    Collection Time: 10/15/20  1:33 PM  Result Value Ref Range    Glucose-Capillary 254 (H) 70 - 99 mg/dL      Comment: Glucose reference range applies only to samples taken after fasting for at least 8 hours.  Glucose, capillary     Status: Abnormal    Collection Time: 10/15/20  2:34 PM  Result Value Ref Range    Glucose-Capillary 142 (H) 70 - 99 mg/dL      Comment: Glucose reference range applies only to samples taken after fasting for at least 8 hours.  Glucose, capillary     Status: None    Collection Time: 10/15/20  3:33 PM  Result Value Ref Range    Glucose-Capillary 81 70 - 99 mg/dL      Comment: Glucose reference range applies only to samples taken after fasting for at least 8 hours.  Glucose, capillary     Status: Abnormal    Collection Time: 10/15/20  4:25 PM  Result Value Ref  Range    Glucose-Capillary 109 (H) 70 - 99 mg/dL      Comment: Glucose reference range applies only to samples taken after fasting for at least  8 hours.  Glucose, capillary     Status: Abnormal    Collection Time: 10/15/20  6:02 PM  Result Value Ref Range    Glucose-Capillary 216 (H) 70 - 99 mg/dL      Comment: Glucose reference range applies only to samples taken after fasting for at least 8 hours.  Glucose, capillary     Status: Abnormal    Collection Time: 10/15/20  9:20 PM  Result Value Ref Range    Glucose-Capillary 186 (H) 70 - 99 mg/dL      Comment: Glucose reference range applies only to samples taken after fasting for at least 8 hours.  CBC     Status: Abnormal    Collection Time: 10/16/20  4:23 AM  Result Value Ref Range    WBC 10.8 (H) 4.0 - 10.5 K/uL    RBC 4.64 4.22 - 5.81 MIL/uL    Hemoglobin 14.1 13.0 - 17.0 g/dL    HCT 43.4 39.0 - 52.0 %    MCV 93.5 80.0 - 100.0 fL    MCH 30.4 26.0 - 34.0 pg    MCHC 32.5 30.0 - 36.0 g/dL    RDW 13.0 11.5 - 15.5 %    Platelets 223 150 - 400 K/uL    nRBC 0.0 0.0 - 0.2 %      Comment: Performed at [facility], [place] [address], [place], [place] [id number]  Basic metabolic panel     Status: Abnormal    Collection Time: 10/16/20  4:23 AM  Result Value Ref Range    Sodium 136 135 - 145 mmol/L    Potassium 3.9 3.5 - 5.1 mmol/L    Chloride 103 98 - 111 mmol/L    CO2 24 22 - 32 mmol/L    Glucose, Bld 201 (H) 70 - 99 mg/dL      Comment: Glucose reference range applies only to samples taken after fasting for at least 8 hours.    BUN 15 8 - 23 mg/dL    Creatinine, Ser 0.83 0.61 - 1.24 mg/dL    Calcium 8.6 (L) 8.9 - 10.3 mg/dL    GFR, Estimated >60 >60 mL/min      Comment: (NOTE) Calculated using the CKD-EPI Creatinine Equation (2021)      Anion gap 9 5 - 15      Comment: Performed at [facility] [address], [place], [place] [address]  Glucose, capillary     Status: Abnormal    Collection Time: 10/16/20  7:33 AM   Result Value Ref Range    Glucose-Capillary 222 (H) 70 - 99 mg/dL      Comment: Glucose reference range applies only to samples taken after fasting for at least 8 hours.  Glucose, capillary     Status: Abnormal    Collection Time: 10/16/20 11:32 AM  Result Value Ref Range    Glucose-Capillary 272 (H) 70 - 99 mg/dL      Comment: Glucose reference range applies only to samples taken after fasting for at least 8 hours.  Glucose, capillary     Status: Abnormal    Collection Time: 10/16/20  5:14 PM  Result Value Ref Range    Glucose-Capillary 135 (H) 70 - 99 mg/dL      Comment: Glucose reference range applies only to samples taken after fasting for at least 8 hours.  Glucose, capillary     Status: Abnormal    Collection Time: 10/16/20  9:48 PM  Result Value Ref Range  Glucose-Capillary 347 (H) 70 - 99 mg/dL      Comment: Glucose reference range applies only to samples taken after fasting for at least 8 hours.  CBC     Status: None    Collection Time: 10/17/20  3:47 AM  Result Value Ref Range    WBC 9.4 4.0 - 10.5 K/uL    RBC 4.51 4.22 - 5.81 MIL/uL    Hemoglobin 13.6 13.0 - 17.0 g/dL    HCT 42.3 39.0 - 52.0 %    MCV 93.8 80.0 - 100.0 fL    MCH 30.2 26.0 - 34.0 pg    MCHC 32.2 30.0 - 36.0 g/dL    RDW 12.9 11.5 - 15.5 %    Platelets 198 150 - 400 K/uL    nRBC 0.0 0.0 - 0.2 %      Comment: Performed at [facility], [place] [address] [place], [place] [id number]  Basic metabolic panel     Status: Abnormal    Collection Time: 10/17/20  3:47 AM  Result Value Ref Range    Sodium 136 135 - 145 mmol/L    Potassium 3.9 3.5 - 5.1 mmol/L    Chloride 104 98 - 111 mmol/L    CO2 26 22 - 32 mmol/L    Glucose, Bld 194 (H) 70 - 99 mg/dL      Comment: Glucose reference range applies only to samples taken after fasting for at least 8 hours.    BUN 17 8 - 23 mg/dL    Creatinine, Ser 0.87 0.61 - 1.24 mg/dL    Calcium 8.6 (L) 8.9 - 10.3 mg/dL    GFR, Estimated >60 >60 mL/min      Comment:  (NOTE) Calculated using the CKD-EPI Creatinine Equation (2021)      Anion gap 6 5 - 15      Comment: Performed at [facility] [address], [place], [place] [address]  Glucose, capillary     Status: Abnormal    Collection Time: 10/17/20  7:18 AM  Result Value Ref Range    Glucose-Capillary 215 (H) 70 - 99 mg/dL      Comment: Glucose reference range applies only to samples taken after fasting for at least 8 hours.       Imaging Results (Last 48 hours)  MR BRAIN W WO CONTRAST   Result Date: 10/15/2020 CLINICAL DATA:  75 year old male with superior right hemisphere intra-axial hemorrhage on presentation head CT for left leg weakness. Suspicion of amyloid angiopathy, and also demonstration of a 6 cm right anterior frontal convexity meningioma invading and occluding the anterior segment of the superior sagittal sinus on December MRI. EXAM: MRI HEAD WITHOUT AND WITH CONTRAST TECHNIQUE: Multiplanar, multiecho pulse sequences of the brain and surrounding structures were obtained without and with intravenous contrast. CONTRAST:  7.63mL GADAVIST GADOBUTROL 1 MMOL/ML IV SOLN COMPARISON:  Head and cervical spine CT 10/14/2020. Brain MRI 05/15/2020. FINDINGS: Brain: Right superior frontal gyrus motor strip intra-axial hemorrhage encompassing 41 by 22 x 33 mm (AP by transverse by CC), estimated volume 15 mL) with mostly hyperintense T2 and isointense T1 signal. Surrounding edema is contiguous with other chronic white matter T2 and FLAIR hyperintensity. There is only mild regional mass effect. Abnormal diffusion associated with the blood products, no larger area of restricted diffusion. No contrast enhancement. No diffusion restriction elsewhere in the brain. The previous small bilateral subdural hematomas seen in December appear resolved. Superimposed anterior right frontal convexity en plaque type meningioma  appears stable and size and configuration since December, roughly 6.6 cm long axis, 1.9 cm  thickness. Stable mass effect on the underlying brain with no convincing cerebral edema. Innumerable micro hemorrhages on SWI redemonstrated in addition to the acute parenchymal hemorrhage (series 14, image 43). Stable gray and white matter signal elsewhere. No ventriculomegaly or intraventricular hemorrhage. Basilar cisterns remain normal. Negative pituitary and cervicomedullary junction. No other abnormal intracranial enhancement. Vascular: Major intracranial vascular flow voids are stable since December, including loss of the anterior but preserved posterior superior sagittal sinus dural venous flow void. And the other major dural venous sinuses are enhancing and appear to be patent following contrast today. Skull and upper cervical spine: Stable, negative for age. Sinuses/Orbits: Stable, negative. Other: Mastoids remain clear. Visible internal auditory structures appear normal. IMPRESSION: 1. Stable intra-axial hemorrhage in the right superior frontal gyrus/motor strip since yesterday, estimated volume 15 mL. 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 Mild surrounding edema. No significant mass effect or other complicating features. 2. Stable right anterior frontal convexity en plaque Meningioma since December. Anterior segment of the superior sagittal sinus occluded as before but the other dural venous sinuses remain patent. 3. Small bilateral subdural hematomas have resolved since December. Electronically Signed   By: Genevie Ann M.D.   On: 10/15/2020 11:28    ECHOCARDIOGRAM COMPLETE   Result Date: 10/15/2020    ECHOCARDIOGRAM REPORT   Patient Name:   Lance [name] Date of Exam: 10/15/2020 Medical Rec #:  [id number]        Height:       69.0 in Accession #:    [id number]       Weight:       169.8 lb Date of Birth:  [birth year]        BSA:          1.927 m Patient Age:    72 years         BP:           122/77 mmHg Patient Gender: M                HR:           83 bpm.  Exam Location:  Inpatient Procedure: 2D Echo Indications:    stroke  History:        Patient has prior history of Echocardiogram examinations, most                 recent 05/15/2020. Hepatitis C; Risk Factors:Diabetes and IV drug                 use.  Sonographer:    Johny Chess Referring PhysHH:[id number] ASHISH ARORA IMPRESSIONS  1. Left ventricular ejection fraction, by estimation, is 70 to 75%. The left ventricle has hyperdynamic function. The left ventricle has no regional wall motion abnormalities. There is mild concentric left ventricular hypertrophy. Left ventricular diastolic parameters are consistent with Grade I diastolic dysfunction (impaired relaxation).  2. Right ventricular systolic function is normal. The right ventricular size is normal.  3. The mitral valve is normal in structure. No evidence of mitral valve regurgitation. No evidence of mitral stenosis.  4. The aortic valve is normal in structure. Aortic valve regurgitation is not visualized. No aortic stenosis is present.  5. The inferior vena cava is normal in size with greater than 50% respiratory variability, suggesting right atrial pressure of 3 mmHg. FINDINGS  Left Ventricle: Left ventricular ejection fraction,  by estimation, is 70 to 75%. The left ventricle has hyperdynamic function. The left ventricle has no regional wall motion abnormalities. The left ventricular internal cavity size was normal in size. There is mild concentric left ventricular hypertrophy. Left ventricular diastolic parameters are consistent with Grade I diastolic dysfunction (impaired relaxation). Right Ventricle: The right ventricular size is normal. No increase in right ventricular wall thickness. Right ventricular systolic function is normal. Left Atrium: Left atrial size was normal in size. Right Atrium: Right atrial size was normal in size. Prominent Eustachian valve. Pericardium: There is no evidence of pericardial effusion. Mitral Valve: The mitral valve is  normal in structure. No evidence of mitral valve regurgitation. No evidence of mitral valve stenosis. Tricuspid Valve: The tricuspid valve is normal in structure. Tricuspid valve regurgitation is not demonstrated. No evidence of tricuspid stenosis. Aortic Valve: The aortic valve is normal in structure. Aortic valve regurgitation is not visualized. No aortic stenosis is present. Pulmonic Valve: The pulmonic valve was normal in structure. Pulmonic valve regurgitation is not visualized. No evidence of pulmonic stenosis. Aorta: The aortic root is normal in size and structure. Venous: The inferior vena cava is normal in size with greater than 50% respiratory variability, suggesting right atrial pressure of 3 mmHg. IAS/Shunts: No atrial level shunt detected by color flow Doppler.  LEFT VENTRICLE PLAX 2D LVIDd:         4.20 cm  Diastology LVIDs:         2.80 cm  LV e' medial:    6.20 cm/s LV PW:         1.40 cm  LV E/e' medial:  8.6 LV IVS:        1.40 cm  LV e' lateral:   9.25 cm/s LVOT diam:     2.10 cm  LV E/e' lateral: 5.8 LVOT Area:     3.46 cm  RIGHT VENTRICLE             IVC RV S prime:     18.50 cm/s  IVC diam: 1.70 cm TAPSE (M-mode): 2.3 cm LEFT ATRIUM             Index       RIGHT ATRIUM           Index LA diam:        3.40 cm 1.76 cm/m  RA Area:     16.70 cm LA Vol (A2C):   56.4 ml 29.27 ml/m RA Volume:   42.60 ml  22.11 ml/m LA Vol (A4C):   36.1 ml 18.73 ml/m LA Biplane Vol: 46.1 ml 23.92 ml/m   AORTA Ao Root diam: 3.20 cm MITRAL VALVE MV Area (PHT): 2.56 cm    SHUNTS MV Decel Time: 296 msec    Systemic Diam: 2.10 cm MV E velocity: 53.60 cm/s MV A velocity: 75.40 cm/s MV E/A ratio:  0.71 Mihai Croitoru MD Electronically signed by Sanda Klein MD Signature Date/Time: 10/15/2020/3:53:16 PM    Final              Medical Problem List and Plan: 1.  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  Antithrombotics: -DVT/anticoagulation: SCDs             -  antiplatelet therapy: N/A 3. Pain Management: Tylenol as needed -pt reports tylenol doesn't work - suggest Tramadol? 4. Mood/schizophrenia: Provide emotional support             -antipsychotic agents: N/A 5. Neuropsych: This patient is capable of making decisions on his own behalf. 6. Skin/Wound Care: Routine skin checks 7. Fluids/Electrolytes/Nutrition: Routine in and outs with follow-up chemistries 8.  Hypertension.  Norvasc 10 mg daily 9.  Diabetes mellitus.  Hemoglobin A1c 10.1.  NovoLog 7 units 3 times daily, Levemir 22 units nightly 10.  History of alcohol polysubstance use.  Urine drug screen positive cocaine.  Provide counseling 11.  History of hepatitis C.  Follow-up outpatient 12. L foot drop- suggest possible AFO, etc and PRAFO to reduce risk of L ankle contracture 13. L inattention- per primary team   Cathlyn Parsons, PA-C 10/17/2020    I have personally performed a face to face diagnostic evaluation of this patient and formulated the key components of the plan.  Additionally, I have personally reviewed laboratory data, imaging studies, as well as relevant notes and concur with the physician assistant's documentation above.

## 2020-10-19 NOTE — Progress Notes (Signed)
Charlett Blake, MD  Physician  Physical Medicine and Rehabilitation  Consult Note     Addendum  Date of Service:  10/16/2020 10:04 AM      Related encounter: ED to Hosp-Admission (Discharged) from 10/14/2020 in [place] 3W [facility]       Expand All Collapse All     Show:Clear all [x] Manual[x] Template[] Copied  Added by: [x] Angiulli, Lavon Paganini, PA-C[x] Kirsteins, Luanna Salk, MD   [] Hover for details       Physical Medicine and Rehabilitation Consult Reason for Consult: Weakness with gait disorder Referring Physician: Dr. Leonie Man   HPI: Lance [name] is a 75 y.o. right-handed male with history of diabetes mellitus, hepatitis C and schizophrenia as well as alcohol and polysubstance abuse.  Per chart review patient lives alone.  1 level home 3 steps to entry.  Used a Rollator for the past few weeks due to gait dysfunction.  Presented 10/14/2020 with weakness x2 weeks especially left lower extremity with gait disorder.  CT of the head showed a 3.4 x 1.7 x 2.8 cm acute right frontal parietal parenchymal bleed.  CT cervical spine negative.  MRI follow-up showed stable intra-axial hemorrhage in the right superior frontal gyrus motor strip.  Stable right anterior frontal convexity en plaque meningioma since December.  Small bilateral subdural hematomas resolved since earlier tracings of December.  Echocardiogram ejection fraction of 70 to [id number] grade 1 diastolic dysfunction.  Admission chemistries unremarkable except glucose 167, alcohol negative, urine drug screen positive cocaine.  Neurology consulted conservative care close monitoring of blood pressure.  Tolerating a regular consistency diet.  Due to patient's decreased functional mobility recommendations of physical medicine rehab consult.   Review of Systems  Constitutional: Negative for chills and fever.  HENT: Negative for hearing loss.   Eyes: Negative for blurred vision and double vision.  Respiratory:  Negative for cough and shortness of breath.   Cardiovascular: Negative for chest pain and palpitations.  Gastrointestinal: Positive for constipation. Negative for heartburn, nausea and vomiting.  Genitourinary: Negative for dysuria, flank pain and hematuria.  Musculoskeletal: Positive for falls and myalgias.  Skin: Negative for rash.  Neurological: Positive for weakness.  Psychiatric/Behavioral:       Schizophrenia  All other systems reviewed and are negative.      Past Medical History:  Diagnosis Date  . Diabetes mellitus without complication ([place])   . Hepatitis C   . Schizophrenia [facility])         Past Surgical History:  Procedure Laterality Date  . COLONOSCOPY WITH PROPOFOL N/A 08/15/2020   Procedure: COLONOSCOPY WITH PROPOFOL;  Surgeon: Lucilla Lame, MD;  Location: [facility] ENDOSCOPY;  Service: Endoscopy;  Laterality: N/A;  . ESOPHAGOGASTRODUODENOSCOPY (EGD) WITH PROPOFOL N/A 10/10/2016   Procedure: ESOPHAGOGASTRODUODENOSCOPY (EGD) WITH PROPOFOL;  Surgeon: Lucilla Lame, MD;  Location: [facility] ENDOSCOPY;  Service: Endoscopy;  Laterality: N/A;  . HERNIA REPAIR    . VISCERAL ARTERY INTERVENTION N/A 10/11/2016   Procedure: Visceral Artery Intervention;  Surgeon: Algernon Huxley, MD;  Location: [place] [facility] LAB;  Service: Cardiovascular;  Laterality: N/A;        Family History  Problem Relation Age of Onset  . Diabetes Sister    Social History:  reports that he has been smoking. He has a 2.50 pack-year smoking history. He has never used smokeless tobacco. He reports previous alcohol use of about 10.0 standard drinks of alcohol per week. He reports that he does not use drugs. Allergies: No Known Allergies  Medications Prior to Admission  Medication Sig Dispense Refill  . acetaminophen (TYLENOL) 650 MG CR tablet Take 650 mg by mouth every 8 (eight) hours as needed for pain.    Marland Kitchen albuterol (VENTOLIN HFA) 108 (90 Base) MCG/ACT inhaler Inhale 2 puffs into the lungs every 6 (six)  hours as needed for wheezing or shortness of breath. 18 g 0  . atorvastatin (LIPITOR) 20 MG tablet Take 20 mg by mouth daily.    . citalopram (CELEXA) 10 MG tablet Take 1 tablet (10 mg total) by mouth daily. 30 tablet 0  . diclofenac Sodium (VOLTAREN) 1 % GEL Apply 2 g topically daily as needed (For pain).    . feeding supplement (ENSURE ENLIVE / ENSURE PLUS) LIQD Take 237 mLs by mouth 3 (three) times daily between meals. 21330 mL 0  . ferrous sulfate 325 (65 FE) MG tablet Take 1 tablet (325 mg total) by mouth daily with breakfast. 30 tablet 0  . gabapentin (NEURONTIN) 300 MG capsule Take 1 capsule by mouth 3 (three) times daily.    . insulin aspart (NOVOLOG) 100 UNIT/ML FlexPen 4 units subcutaneous injection with meals (okay to substitute any short acting insulin pen that is covered) (Patient taking differently: Inject 4 Units into the skin 3 (three) times daily with meals. 4 units subcutaneous injection with meals (okay to substitute any short acting insulin pen that is covered)) 15 mL 0  . insulin detemir (LEVEMIR) 100 UNIT/ML FlexPen Inject 18 Units into the skin at bedtime. 15 mL 1  . Insulin Pen Needle 34G X 3.5 MM MISC 1 Dose by Does not apply route 4 (four) times daily -  before meals and at bedtime. 200 each 0  . Multiple Vitamin (MULTI-VITAMIN) tablet Take 1 tablet by mouth daily.    Marland Kitchen OLANZapine (ZYPREXA) 5 MG tablet Take 5 mg by mouth daily.    . polyethylene glycol (MIRALAX / GLYCOLAX) 17 g packet Take 17 g by mouth daily as needed for mild constipation. 30 each 0  . polyvinyl alcohol (ARTIFICIAL TEARS) 1.4 % ophthalmic solution Place 1 drop into both eyes as needed for dry eyes. (Patient taking differently: Place 1 drop into both eyes daily as needed for dry eyes.) 15 mL 0  . sucralfate (CARAFATE) 1 g tablet Take 1 g by mouth daily.    . tamsulosin (FLOMAX) 0.4 MG CAPS capsule Take 2 capsules (0.8 mg total) by mouth daily. 60 capsule 0  . thiamine 100 MG tablet Take 1 tablet  (100 mg total) by mouth daily. 30 tablet 0  . vitamin B-12 1000 MCG tablet Take 1 tablet (1,000 mcg total) by mouth daily. 30 tablet 0    Home: Home Living Family/patient expects to be discharged to:: Private residence Living Arrangements: Alone Available Help at Discharge: Family,Available PRN/intermittently Type of Home: House Home Access: Stairs to enter [employer] of Steps: 3 Entrance Stairs-Rails: Right Home Layout: One level Biochemist, clinical: Standard Home Equipment: Walker - 4 wheels,Cane - single point  Functional History: Prior Function Level of Independence: Needs assistance Gait / Transfers Assistance Needed: Using Rollator in past couple weeks when ambulatory dysfunction occurred ADL's / Homemaking Assistance Needed: Pt family drives him to grocery shopping, appointment Comments: Poor historian Functional Status:  Mobility: Bed Mobility Overal bed mobility: Needs Assistance Bed Mobility: Supine to Sit,Sit to Supine Supine to sit: Max assist Sit to supine: Max assist General bed mobility comments: Difficulty sequencing, assisting minimally with bringing RLE off edge of bed, assist for LLE  and pulling trunk to upright position. Also maxA for return to supine, +2 to scoot up in the bed Transfers Overall transfer level: Needs assistance Equipment used: None Transfers: Sit to/from Stand Sit to Stand: Max assist General transfer comment: Face to face transfer with knee block, maxA to initiate, unable to weight shift to take steps  ADL:  Cognition: Cognition Overall Cognitive Status: Impaired/Different from baseline Orientation Level: Oriented to person,Oriented to place,Oriented to situation,Disoriented to time Cognition Arousal/Alertness: Awake/alert Behavior During Therapy: Flat affect Overall Cognitive Status: Impaired/Different from baseline Area of Impairment: Orientation,Attention,Memory,Following commands,Safety/judgement,Awareness,Problem  solving Orientation Level: Disoriented to,Place,Time,Situation Current Attention Level: Sustained Memory: Decreased short-term memory Following Commands: Follows one step commands inconsistently Safety/Judgement: Decreased awareness of deficits,Decreased awareness of safety Awareness: Intellectual Problem Solving: Slow processing,Difficulty sequencing,Requires verbal cues,Requires tactile cues General Comments: Pt oriented to self only. Able to correctly state month and year with options. Knows he is in the hospital, but does not know which one. Pt demonstrates STM deficits, cannot recall after re-orientation. Repetition required for 1 step commands.  Blood pressure (!) 138/94, pulse 72, temperature 98.9 F (37.2 C), temperature source Axillary, resp. rate 15, height 5\' 9"  (1.753 m), weight 77 kg, SpO2 98 %. Physical Exam Vitals and nursing note reviewed.  Constitutional:      Appearance: He is normal weight.  Eyes:     Extraocular Movements: Extraocular movements intact.     Conjunctiva/sclera: Conjunctivae normal.     Pupils: Pupils are equal, round, and reactive to light.  Cardiovascular:     Rate and Rhythm: Normal rate and regular rhythm.     Heart sounds: Normal heart sounds.  Pulmonary:     Effort: Pulmonary effort is normal.     Breath sounds: Normal breath sounds.  Abdominal:     General: Abdomen is flat. Bowel sounds are normal. There is no distension.     Palpations: Abdomen is soft.  Musculoskeletal:     Comments: No pain with upper limb or lower limb range of motion  Skin:    General: Skin is warm and dry.  Neurological:     Mental Status: He is lethargic.     Motor: Weakness and abnormal muscle tone present. No tremor.     Coordination: Coordination abnormal.     Gait: Gait abnormal.     Comments: Patient is alert in no acute distress.  Speech is dysarthric but intelligible.  Provides name and age.  Follows simple commands. Patient not oriented to place or time,  thinks he is in [place] and in the hospital  Increased tone left pectoralis  Motor strength difficult to assess secondary to level of alertness.  The patient has 3 - hand grip on the left 0 proximally, 0/5 in the left hip flexor knee extensor ankle dorsiflexor 4-/5 in the right upper and right lower limb. Sensory difficult to assess secondary to lethargy and confusion.    Psychiatric:        Mood and Affect: Mood normal.        Behavior: Behavior normal.     Lab Results Last 24 Hours       Results for orders placed or performed during the hospital encounter of 10/14/20 (from the past 24 hour(s))  Glucose, capillary     Status: Abnormal   Collection Time: 10/15/20 11:29 AM  Result Value Ref Range   Glucose-Capillary 480 (H) 70 - 99 mg/dL  Glucose, capillary     Status: Abnormal   Collection Time: 10/15/20 12:38 PM  Result Value Ref Range   Glucose-Capillary 422 (H) 70 - 99 mg/dL  Glucose, capillary     Status: Abnormal   Collection Time: 10/15/20  1:33 PM  Result Value Ref Range   Glucose-Capillary 254 (H) 70 - 99 mg/dL  Glucose, capillary     Status: Abnormal   Collection Time: 10/15/20  2:34 PM  Result Value Ref Range   Glucose-Capillary 142 (H) 70 - 99 mg/dL  Glucose, capillary     Status: None   Collection Time: 10/15/20  3:33 PM  Result Value Ref Range   Glucose-Capillary 81 70 - 99 mg/dL  Glucose, capillary     Status: Abnormal   Collection Time: 10/15/20  4:25 PM  Result Value Ref Range   Glucose-Capillary 109 (H) 70 - 99 mg/dL  Glucose, capillary     Status: Abnormal   Collection Time: 10/15/20  6:02 PM  Result Value Ref Range   Glucose-Capillary 216 (H) 70 - 99 mg/dL  Glucose, capillary     Status: Abnormal   Collection Time: 10/15/20  9:20 PM  Result Value Ref Range   Glucose-Capillary 186 (H) 70 - 99 mg/dL  CBC     Status: Abnormal   Collection Time: 10/16/20  4:23 AM  Result Value Ref Range   WBC 10.8 (H) 4.0 - 10.5 K/uL   RBC  4.64 4.22 - 5.81 MIL/uL   Hemoglobin 14.1 13.0 - 17.0 g/dL   HCT 43.4 39.0 - 52.0 %   MCV 93.5 80.0 - 100.0 fL   MCH 30.4 26.0 - 34.0 pg   MCHC 32.5 30.0 - 36.0 g/dL   RDW 13.0 11.5 - 15.5 %   Platelets 223 150 - 400 K/uL   nRBC 0.0 0.0 - 0.2 %  Basic metabolic panel     Status: Abnormal   Collection Time: 10/16/20  4:23 AM  Result Value Ref Range   Sodium 136 135 - 145 mmol/L   Potassium 3.9 3.5 - 5.1 mmol/L   Chloride 103 98 - 111 mmol/L   CO2 24 22 - 32 mmol/L   Glucose, Bld 201 (H) 70 - 99 mg/dL   BUN 15 8 - 23 mg/dL   Creatinine, Ser 0.83 0.61 - 1.24 mg/dL   Calcium 8.6 (L) 8.9 - 10.3 mg/dL   GFR, Estimated >60 >60 mL/min   Anion gap 9 5 - 15  Glucose, capillary     Status: Abnormal   Collection Time: 10/16/20  7:33 AM  Result Value Ref Range   Glucose-Capillary 222 (H) 70 - 99 mg/dL      Imaging Results (Last 48 hours)  DG Chest 2 View  Result Date: 10/14/2020 CLINICAL DATA:  Neck pain and left leg weakness. EXAM: CHEST - 2 VIEW COMPARISON:  May 14, 2020 FINDINGS: Chronic appearing increased interstitial lung markings are seen without evidence of acute infiltrate, pleural effusion or pneumothorax. The heart size and mediastinal contours are within normal limits. Degenerative changes seen involving the right shoulder and thoracic spine. Ill-defined suture like material is seen overlying the left upper quadrant. IMPRESSION: Stable exam without active cardiopulmonary disease. Electronically Signed   By: Virgina Norfolk M.D.   On: 10/14/2020 19:27   CT Head Wo Contrast  Result Date: 10/14/2020 CLINICAL DATA:  Neck pain and left leg weakness. EXAM: CT HEAD WITHOUT CONTRAST TECHNIQUE: Contiguous axial images were obtained from the base of the skull through the vertex without intravenous contrast. COMPARISON:  March 22, 2020 FINDINGS: Brain: There is mild cerebral atrophy  with widening of the extra-axial spaces and ventricular dilatation. There are  areas of decreased attenuation within the white matter tracts of the supratentorial brain, consistent with microvascular disease changes. A 3.4 cm x 1.7 cm x 2.8 cm lobulated acute parenchymal bleed is seen within the parasagittal portion of the frontoparietal region on the right. Very mild mass effect is seen on the adjacent sulci with no evidence of midline shift. A stable meningioma is seen along the anterior aspect of the anterior cranial fossa with a mild amount of stable mass-effect noted on the right frontal lobe. Vascular: No hyperdense vessel or unexpected calcification. Skull: Normal. Negative for fracture or focal lesion. Sinuses/Orbits: No acute finding. Other: Persistent moderate to marked severity mildly dense subcutaneous inflammatory fat stranding is seen along the posterior fossa on the right. IMPRESSION: 1. 3.4 cm x 1.7 cm x 2.8 cm acute right frontoparietal parenchymal bleed, as described above. MRI correlation is recommended. 2. Stable right anterior cranial fossa meningioma. Electronically Signed   By: Virgina Norfolk M.D.   On: 10/14/2020 19:37   CT Cervical Spine Wo Contrast  Result Date: 10/14/2020 CLINICAL DATA:  Neck pain and left leg weakness. EXAM: CT CERVICAL SPINE WITHOUT CONTRAST TECHNIQUE: Multidetector CT imaging of the cervical spine was performed without intravenous contrast. Multiplanar CT image reconstructions were also generated. COMPARISON:  None. FINDINGS: Alignment: Normal. Skull base and vertebrae: No acute fracture. Chronic changes are seen involving the body and tip of the dens as well as the anterior arch of C1. Soft tissues and spinal canal: No prevertebral fluid or swelling. No visible canal hematoma. Disc levels: Marked severity endplate sclerosis is seen at the levels of C3-C4, C4-C5 and C5-C6. Moderate to marked severity intervertebral disc space narrowing is seen at the level of C5-C6. Marked severity bilateral multilevel facet joint hypertrophy is noted.  Upper chest: Negative. Other: None. IMPRESSION: 1. No evidence of acute cervical spine fracture or subluxation. 2. Marked severity multilevel degenerative changes. Electronically Signed   By: Virgina Norfolk M.D.   On: 10/14/2020 19:41   MR BRAIN W WO CONTRAST  Result Date: 10/15/2020 CLINICAL DATA:  75 year old male with superior right hemisphere intra-axial hemorrhage on presentation head CT for left leg weakness. Suspicion of amyloid angiopathy, and also demonstration of a 6 cm right anterior frontal convexity meningioma invading and occluding the anterior segment of the superior sagittal sinus on December MRI. EXAM: MRI HEAD WITHOUT AND WITH CONTRAST TECHNIQUE: Multiplanar, multiecho pulse sequences of the brain and surrounding structures were obtained without and with intravenous contrast. CONTRAST:  7.27mL GADAVIST GADOBUTROL 1 MMOL/ML IV SOLN COMPARISON:  Head and cervical spine CT 10/14/2020. Brain MRI 05/15/2020. FINDINGS: Brain: Right superior frontal gyrus motor strip intra-axial hemorrhage encompassing 41 by 22 x 33 mm (AP by transverse by CC), estimated volume 15 mL) with mostly hyperintense T2 and isointense T1 signal. Surrounding edema is contiguous with other chronic white matter T2 and FLAIR hyperintensity. There is only mild regional mass effect. Abnormal diffusion associated with the blood products, no larger area of restricted diffusion. No contrast enhancement. No diffusion restriction elsewhere in the brain. The previous small bilateral subdural hematomas seen in December appear resolved. Superimposed anterior right frontal convexity en plaque type meningioma appears stable and size and configuration since December, roughly 6.6 cm long axis, 1.9 cm thickness. Stable mass effect on the underlying brain with no convincing cerebral edema. Innumerable micro hemorrhages on SWI redemonstrated in addition to the acute parenchymal hemorrhage (series 14, image 43). Stable  gray and white matter  signal elsewhere. No ventriculomegaly or intraventricular hemorrhage. Basilar cisterns remain normal. Negative pituitary and cervicomedullary junction. No other abnormal intracranial enhancement. Vascular: Major intracranial vascular flow voids are stable since December, including loss of the anterior but preserved posterior superior sagittal sinus dural venous flow void. And the other major dural venous sinuses are enhancing and appear to be patent following contrast today. Skull and upper cervical spine: Stable, negative for age. Sinuses/Orbits: Stable, negative. Other: Mastoids remain clear. Visible internal auditory structures appear normal. IMPRESSION: 1. Stable intra-axial hemorrhage in the right superior frontal gyrus/motor strip since yesterday, estimated volume 15 mL. 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 Mild surrounding edema. No significant mass effect or other complicating features. 2. Stable right anterior frontal convexity en plaque Meningioma since December. Anterior segment of the superior sagittal sinus occluded as before but the other dural venous sinuses remain patent. 3. Small bilateral subdural hematomas have resolved since December. Electronically Signed   By: Genevie Ann M.D.   On: 10/15/2020 11:28   ECHOCARDIOGRAM COMPLETE  Result Date: 10/15/2020    ECHOCARDIOGRAM REPORT   Patient Name:   WYITT SUDLER Date of Exam: 10/15/2020 Medical Rec #:  [id number]        Height:       69.0 in Accession #:    [id number]       Weight:       169.8 lb Date of Birth:  06/27/1945        BSA:          1.927 m Patient Age:    32 years         BP:           122/77 mmHg Patient Gender: M                HR:           83 bpm. Exam Location:  Inpatient Procedure: 2D Echo Indications:    stroke  History:        Patient has prior history of Echocardiogram examinations, most                 recent 05/15/2020. Hepatitis C; Risk Factors:Diabetes and IV drug                  use.  Sonographer:    Johny Chess Referring PhysHH:[id number] ASHISH ARORA IMPRESSIONS  1. Left ventricular ejection fraction, by estimation, is 70 to 75%. The left ventricle has hyperdynamic function. The left ventricle has no regional wall motion abnormalities. There is mild concentric left ventricular hypertrophy. Left ventricular diastolic parameters are consistent with Grade I diastolic dysfunction (impaired relaxation).  2. Right ventricular systolic function is normal. The right ventricular size is normal.  3. The mitral valve is normal in structure. No evidence of mitral valve regurgitation. No evidence of mitral stenosis.  4. The aortic valve is normal in structure. Aortic valve regurgitation is not visualized. No aortic stenosis is present.  5. The inferior vena cava is normal in size with greater than 50% respiratory variability, suggesting right atrial pressure of 3 mmHg. FINDINGS  Left Ventricle: Left ventricular ejection fraction, by estimation, is 70 to 75%. The left ventricle has hyperdynamic function. The left ventricle has no regional wall motion abnormalities. The left ventricular internal cavity size was normal in size. There is mild concentric left ventricular hypertrophy. Left ventricular diastolic parameters are consistent with Grade I diastolic  dysfunction (impaired relaxation). Right Ventricle: The right ventricular size is normal. No increase in right ventricular wall thickness. Right ventricular systolic function is normal. Left Atrium: Left atrial size was normal in size. Right Atrium: Right atrial size was normal in size. Prominent Eustachian valve. Pericardium: There is no evidence of pericardial effusion. Mitral Valve: The mitral valve is normal in structure. No evidence of mitral valve regurgitation. No evidence of mitral valve stenosis. Tricuspid Valve: The tricuspid valve is normal in structure. Tricuspid valve regurgitation is not demonstrated. No evidence of tricuspid  stenosis. Aortic Valve: The aortic valve is normal in structure. Aortic valve regurgitation is not visualized. No aortic stenosis is present. Pulmonic Valve: The pulmonic valve was normal in structure. Pulmonic valve regurgitation is not visualized. No evidence of pulmonic stenosis. Aorta: The aortic root is normal in size and structure. Venous: The inferior vena cava is normal in size with greater than 50% respiratory variability, suggesting right atrial pressure of 3 mmHg. IAS/Shunts: No atrial level shunt detected by color flow Doppler.  LEFT VENTRICLE PLAX 2D LVIDd:         4.20 cm  Diastology LVIDs:         2.80 cm  LV e' medial:    6.20 cm/s LV PW:         1.40 cm  LV E/e' medial:  8.6 LV IVS:        1.40 cm  LV e' lateral:   9.25 cm/s LVOT diam:     2.10 cm  LV E/e' lateral: 5.8 LVOT Area:     3.46 cm  RIGHT VENTRICLE             IVC RV S prime:     18.50 cm/s  IVC diam: 1.70 cm TAPSE (M-mode): 2.3 cm LEFT ATRIUM             Index       RIGHT ATRIUM           Index LA diam:        3.40 cm 1.76 cm/m  RA Area:     16.70 cm LA Vol (A2C):   56.4 ml 29.27 ml/m RA Volume:   42.60 ml  22.11 ml/m LA Vol (A4C):   36.1 ml 18.73 ml/m LA Biplane Vol: 46.1 ml 23.92 ml/m   AORTA Ao Root diam: 3.20 cm MITRAL VALVE MV Area (PHT): 2.56 cm    SHUNTS MV Decel Time: 296 msec    Systemic Diam: 2.10 cm MV E velocity: 53.60 cm/s MV A velocity: 75.40 cm/s MV E/A ratio:  0.71 Mihai Croitoru MD Electronically signed by Sanda Klein MD Signature Date/Time: 10/15/2020/3:53:16 PM    Final      Assessment/Plan: Diagnosis: 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  Left hemiplegia and lethargy 1. Does the need for close, 24 hr/day medical supervision in concert with the patient's rehab needs make it unreasonable for this patient to be served in a less intensive setting? Yes 2. Co-Morbidities requiring supervision/potential complications: Hypertension, cocaine abuse 3. Due to bladder management, bowel  management, safety, skin/wound care, disease management, medication administration, pain management and patient education, does the patient require 24 hr/day rehab nursing? Yes 4. Does the patient require coordinated care of a physician, rehab nurse, therapy disciplines of PT, OT, speech to address physical and functional deficits in the context of the above medical diagnosis(es)? Yes Addressing deficits in the following areas: balance, endurance, locomotion, strength, transferring, bowel/bladder control, bathing, dressing, feeding, grooming, toileting,  cognition, speech, language, swallowing and psychosocial support 5. Can the patient actively participate in an intensive therapy program of at least 3 hrs of therapy per day at least 5 days per week? Yes 6. The potential for patient to make measurable gains while on inpatient rehab is excellent 7. Anticipated functional outcomes upon discharge from inpatient rehab are min assist and mod assist  with PT, min assist and mod assist with OT, supervision with SLP. 8. Estimated rehab length of stay to reach the above functional goals is: 23 to 25 days 9. Anticipated discharge destination: Home 10. Overall Rehab/Functional Prognosis: fair  RECOMMENDATIONS: This patient's condition is appropriate for continued rehabilitative care in the following setting: CIR once able to participate in  PT OT sessions.  Up in chair 3 hours/day Patient has agreed to participate in recommended program. N/A Note that insurance prior authorization may be required for reimbursement for recommended care.  Comment: Patient states that he lives alone but he has a daughter in [place] that can help him.   Lavon Paganini Angiulli, PA-C 10/16/2020   "I have personally performed a face to face diagnostic evaluation of this patient.  Additionally, I have reviewed and concur with the physician assistant's documentation above." Charlett Blake M.D. [place]  Group Fellow Am Acad of Phys Med and Rehab Diplomate Am Board of Electrodiagnostic Med Fellow Am Board of Interventional Pain        Revision History                             Routing History                   Note Details  Author Charlett Blake, MD File Time 10/16/2020 12:05 PM  Author Type Physician Status Addendum  Last Editor Charlett Blake, MD Service Physical Medicine and [place] # [id number] Admit Date 10/19/2020

## 2020-10-19 NOTE — Plan of Care (Signed)
  Problem: Education: Goal: Knowledge of secondary prevention will improve Outcome: Progressing   Problem: Education: Goal: Knowledge of secondary prevention will improve Outcome: Progressing   Problem: Education: Goal: Knowledge of disease or condition will improve Outcome: Progressing   Problem: Education: Goal: Knowledge of secondary prevention will improve Outcome: Progressing   Problem: Education: Goal: Knowledge of disease or condition will improve Outcome: Progressing

## 2020-10-19 NOTE — Progress Notes (Signed)
Inpatient Rehabilitation Medication Review by a Pharmacist  A complete drug regimen review was completed for this patient to identify any potential clinically significant medication issues.  Clinically significant medication issues were identified:  yes   Type of Medication Issue Identified Description of Issue Urgent (address now) Non-Urgent (address on AM team rounds) Plan Plan Accepted by Provider? (Yes / No / Pending AM Rounds)  Drug Interaction(s) (clinically significant)       Duplicate Therapy       Allergy       No Medication Administration End Date       Incorrect Dose       Additional Drug Therapy Needed  Atorvastatin 20mg  and zyprexa mentioned as starting per Dr [place] Pink at discharge Urgent.  Spoke with Dr Letta Pate he clarifed to start atorvastatin 20mg  po daily at this time but hold zyprexa for now  MD determined correct medication therapy   Other         Name of provider notified for urgent issues identified: Dr Letta Pate   Provider Method of Notification: secure chat    For non-urgent medication issues to be resolved on team rounds tomorrow morning a [facility] Secure Chat Handoff was sent to:    Pharmacist comments:   Time spent performing this drug regimen review (minutes):  20   Lance [name] 10/19/2020 3:01 PM

## 2020-10-19 NOTE — Progress Notes (Signed)
[name], RN  Rehab Admission Coordinator  Physical Medicine and Rehabilitation  PMR Pre-admission     Signed  Date of Service:  10/16/2020 4:20 PM      Related encounter: ED to Hosp-Admission (Discharged) from 10/14/2020 in [facility]       Signed          Show:Clear all [x] Manual[x] Template[x] Copied  Added by: [x] Lind Covert, Runell Gess, CCC-SLP[x] [name], RN   [] Hover for details  PMR Admission Coordinator Pre-Admission Assessment  Patient: Lance [name] is an 75 y.o., male MRN: [id number] DOB: 1946-01-02 Height: 5' 9"  (175.3 cm) Weight: 77 kg                                                                                                                                                  Insurance Information HMO: yes    PPO:      PCP:      IPA:      80/20:      OTHER:  PRIMARY: [employer] Medicare      Policy#: [id number]      Subscriber: patient CM Name: [name] with follow up by [name]      Phone#: [phone] Ext [phone]    Fax#: [phone] Pre-Cert#: [id number] for 7 days with update due on 10/25/20      Employer: Retired Benefits:  Phone #: [phone]     Name: [email or web address] Eff. Date: 09/08/20     Deduct: $233 ($0 met)      Out of Pocket Max: $3450 (met $0)      Life Max: N/A  CIR: $2524.00 per admission      SNF: 100% coverage Outpatient: 80%     Co-Pay: 20% Home Health: 100%      Co-Pay: none DME: 80%     Co-Pay: 20% Providers: in-network  SECONDARY: Medicaid [place] Access      Policy#: [id number] o      Phone#: [phone]  Financial Counselor:       Phone#:   The "Data Collection Information Summary" for patients in Inpatient Rehabilitation Facilities with attached "Privacy Act [place] Records" was provided and verbally reviewed with: Patient and Family  Emergency Contact Information         Contact Information    Name Relation Home Work Mobile   Rice,Joyce Daughter    [phone]     Current Medical History  Patient Admitting Diagnosis:  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 Per chart review patient lives alone. 1 level home 3 steps to entry. Used a Rollator for the past few weeks due to gait dysfunction. Presented 10/14/2020 with weakness x2 weeks especially left lower  extremity with gait disorder. CT of the head showed a 3.4 x 1.7 x 2.8 acute right frontal parietal parenchymal bleed. CT cervical spine negative. MRI follow-up showed stable intra-axial hemorrhage in the right superior frontal gyrus motor strip. Stable right anterior frontal convexity en plaque meningioma since December with comparison of prior imaging. Small bilateral subdural hematomas resolved since earlier tracings of December. Prior MRIs were also suggestive of chronic microangiopathy and mild hemorrhages concerning for possible amyloid angiopathy. Echocardiogram with ejection fraction of 70 to 23% grade 1 diastolic dysfunction. Admission chemistries unremarkable except glucose 167 alcohol negative urine drug screen positive cocaine. Neurology follow-up conservative care close monitoring of blood pressure. Tolerating a regular consistency diet. Due to patient's decreased functional ability he is to be  admitted for a comprehensive inpatient rehab program.   Complete NIHSS TOTAL: 7 Glasgow Coma Scale Score: 15  Past Medical History      Past Medical History:  Diagnosis Date  . Diabetes mellitus without complication ([place])   . Hepatitis C   . Schizophrenia ([place])     Family History  family history includes Diabetes in his sister.  Prior Rehab/Hospitalizations:  Has the patient had prior rehab or hospitalizations prior to admission? Yes  Has  the patient had major surgery during 100 days prior to admission? Yes  Current Medications   Current Facility-Administered Medications:  .   stroke: mapping our early stages of recovery book, , Does not apply, Once, Bailey-Modzik, Delila A, NP .  acetaminophen (TYLENOL) tablet 650 mg, 650 mg, Oral, Q4H PRN, 650 mg at 10/18/20 2041 **OR** acetaminophen (TYLENOL) 160 MG/5ML solution 650 mg, 650 mg, Per Tube, Q4H PRN **OR** acetaminophen (TYLENOL) suppository 650 mg, 650 mg, Rectal, Q4H PRN, Bailey-Modzik, Delila A, NP .  [START ON 10/20/2020] amLODipine (NORVASC) tablet 10 mg, 10 mg, Oral, Daily, Bonnielee Haff, MD .  insulin aspart (novoLOG) injection 0-15 Units, 0-15 Units, Subcutaneous, TID WC, Bailey-Modzik, Delila A, NP, 3 Units at 10/19/20 0700 .  insulin aspart (novoLOG) injection 0-5 Units, 0-5 Units, Subcutaneous, QHS, Bailey-Modzik, Delila A, NP, 3 Units at 10/18/20 2209 .  insulin aspart (novoLOG) injection 7 Units, 7 Units, Subcutaneous, TID WC, Ezekiel Slocumb, DO, 7 Units at 10/19/20 0935 .  insulin detemir (LEVEMIR) injection 22 Units, 22 Units, Subcutaneous, QHS, Bonnielee Haff, MD, 22 Units at 10/18/20 2244 .  labetalol (NORMODYNE) injection 10 mg, 10 mg, Intravenous, Q2H PRN, Greta Doom, MD, 10 mg at 10/17/20 0521 .  mupirocin ointment (BACTROBAN) 2 % 1 application, 1 application, Nasal, BID, Bailey-Modzik, Delila A, NP, 1 application at 76/28/31 1034 .  pantoprazole (PROTONIX) injection 40 mg, 40 mg, Intravenous, QHS, Bailey-Modzik, Delila A, NP, 40 mg at 10/18/20 2101 .  senna-docusate (Senokot-S) tablet 1 tablet, 1 tablet, Oral, BID, Bailey-Modzik, Delila A, NP, 1 tablet at 10/19/20 0935  Patients Current Diet:     Diet Order                  Diet Carb Modified Fluid consistency: Thin; Room service appropriate? Yes  Diet effective now                  Precautions / Restrictions Precautions Precautions: Fall,Other (comment) Precaution  Comments: L hemiplegia, BP < 160 Restrictions Weight Bearing Restrictions: No   Has the patient had 2 or more falls or a fall with injury in the past year?Yes  Prior Activity Level Limited Community (1-2x/wk): goes to appointements per daughter report  Prior Functional Level Prior  Function Level of Independence: Needs assistance Gait / Transfers Assistance Needed: Using Rollator in past couple weeks when ambulatory dysfunction occurred ADL's / Homemaking Assistance Needed: Pt family drives him to grocery shopping, appointment Comments: Poor historian  Self Care: Did the patient need help bathing, dressing, using the toilet or eating?  Needed some help  Indoor Mobility: Did the patient need assistance with walking from room to room (with or without device)? Independent  Stairs: Did the patient need assistance with internal or external stairs (with or without device)? Independent  Functional Cognition: Did the patient need help planning regular tasks such as shopping or remembering to take medications? Needed some help  Home Assistive Devices / [place] Devices/Equipment: None Home Equipment: Walker - 4 wheels,Cane - single point  Prior Device Use: Indicate devices/aids used by the patient prior to current illness, exacerbation or injury? cane  Current Functional Level Cognition  Arousal/Alertness: Awake/alert Overall Cognitive Status: Impaired/Different from baseline Current Attention Level: Sustained Orientation Level: Oriented X4 Following Commands: Follows one step commands inconsistently,Follows one step commands with increased time Safety/Judgement: Decreased awareness of deficits,Decreased awareness of safety General Comments: Pt pleasant and appropriate in conversation, improved attention though did perseverate on desire for pillows and assist to scratch back. Pt with difficulty understanding cues to lean forward, to right, etc but improved  movements with pt's purposeful movements (bending to reach sock) Attention: Sustained Sustained Attention: Impaired Memory: Impaired Memory Impairment: Retrieval deficit,Storage deficit Awareness: Impaired Awareness Impairment: Emergent impairment,Anticipatory impairment Problem Solving: Impaired Problem Solving Impairment: Verbal basic Safety/Judgment: Impaired    Extremity Assessment (includes Sensation/Coordination)  Upper Extremity Assessment: LUE deficits/detail LUE Deficits / Details: Weak hand grip but strong thumb adduction, elbow flexion/extension 3/5, shoulder flexion 0/5 LUE Sensation: WNL LUE Coordination: decreased fine motor,decreased gross motor  Lower Extremity Assessment: Defer to PT evaluation LLE Deficits / Details: No active movement noted LLE Sensation: WNL    ADLs  Overall ADL's : Needs assistance/impaired Eating/Feeding: Set up,Sitting Grooming: Minimal assistance,Sitting Grooming Details (indicate cue type and reason): supported sitting using RUE Upper Body Bathing: Moderate assistance,Sitting Lower Body Bathing: Maximal assistance,Sitting/lateral leans,Sit to/from stand Upper Body Dressing : Moderate assistance,Sitting Lower Body Dressing: Maximal assistance,Sitting/lateral leans,Sit to/from stand,+2 for safety/equipment,+2 for physical assistance Lower Body Dressing Details (indicate cue type and reason): Pt did impulsively bend forward to pull up R sock with R UE sitting EOB well though L lateral lean present Toilet Transfer: Maximal assistance,+2 for physical assistance,+2 for safety/equipment Toilet Transfer Details (indicate cue type and reason): use of stedy maxA+2 Toileting- Clothing Manipulation and Hygiene: Total assistance Functional mobility during ADLs: Maximal assistance,+2 for physical assistance,+2 for safety/equipment,Cueing for safety,Cueing for sequencing General ADL Comments: Limited by heavy L lateral lean and difficulty attending to  directions to correct. Guided pt in WB tasks, as well as shifting to achieve midline.    Mobility  Overal bed mobility: Needs Assistance Bed Mobility: Rolling,Sidelying to Sit,Sit to Sidelying Rolling: Mod assist Sidelying to sit: Max assist,+2 for physical assistance Supine to sit: Max assist,+2 for physical assistance Sit to supine: Max assist,+2 for physical assistance General bed mobility comments: Pt overall Max A x 2 for all bed mobility, multimodal cues to initiate though pt with difficulty motor planning. Pt modA to roll to R side for adjustment of bed pads    Transfers  Overall transfer level: Needs assistance Equipment used: None,Ambulation equipment used Transfers: Sit to/from Stand Sit to Stand: Max assist,+2 physical assistance,+2 safety/equipment General transfer comment: deferred due  to poor sitting balance    Ambulation / Gait / Stairs / Wheelchair Mobility       Posture / Balance Dynamic Sitting Balance Sitting balance - Comments: Requires grossly Max A to maintain sitting balance and correct to acheive midline. Balance Overall balance assessment: Needs assistance Sitting-balance support: Feet supported Sitting balance-Leahy Scale: Poor Sitting balance - Comments: Requires grossly Max A to maintain sitting balance and correct to acheive midline. Postural control: Posterior lean,Left lateral lean Standing balance support: Bilateral upper extremity supported Standing balance-Leahy Scale: Poor Standing balance comment: standing x2 times with L lateral lean and unltimately used stedy maxA+2 for sit to stand to pivot to recliner    Special needs/care consideration Continuous Drip IV  None, Diabetic management Yes, is DM and is on insulin in acute hospital , Bladder incontinence, External urinary catheter and Designated visitor Bela Nyborg, daughter; Kenney Houseman, niece     Previous Environmental health practitioner (from acute therapy documentation) Living Arrangements:  Alone  Lives With: Alone Available Help at Discharge: Family,Available 24 hours/day Type of Home: House Home Layout: One level Home Access: Stairs to enter Entrance Stairs-Rails: Right,Left,Can reach both Entrance Stairs-Number of Steps: 4 Bathroom Shower/Tub: Chiropodist: Standard Bathroom Accessibility: Yes How Accessible: Accessible via walker [place]: Yes Type of [place]: [place] (if known): [place]  Discharge Living Setting Plans for Discharge Living Setting: Patient's home Type of Home at Discharge: House Discharge Home Layout: One level Discharge Home Access: Stairs to enter Entrance Stairs-Rails: Right,Left,Can reach both Entrance Stairs-Number of Steps: 4 Discharge Bathroom Shower/Tub: Tub/shower unit Discharge Bathroom Toilet: Standard Discharge Bathroom Accessibility: Yes How Accessible: Accessible via walker Does the patient have any problems obtaining your medications?: Yes (Describe) (pt reported sometimes financially difficult to obtain meds)  Social/Family/Support Systems Anticipated Caregiver: Theophilus Walz, daughter Anticipated Caregiver's Contact Information: [phone] Caregiver Availability: 24/7 (daughter reported that she and other family will provide care for pt) Discharge Plan Discussed with Primary Caregiver: Yes Is Caregiver In Agreement with Plan?: Yes Does Caregiver/Family have Issues with Lodging/Transportation while Pt is in Rehab?: No  Goals Patient/Family Goal for Rehab: Min-Mod A:PT/OT, Supervision-ST Expected length of stay: 23-25 days Pt/Family Agrees to Admission and willing to participate: Yes Program Orientation Provided & Reviewed with Pt/Caregiver Including Roles  & Responsibilities: Yes  Decrease burden of Care through IP rehab admission: NA  Possible need for SNF placement upon discharge: Not anticipated  Patient Condition: This patient's medical and  functional status has changed since the consult dated: 10/16/20 in which the Rehabilitation Physician determined and documented that the patient's condition is appropriate for intensive rehabilitative care in an inpatient rehabilitation facility. See "History of Present Illness" (above) for medical update. Functional changes are: Currently requiring max assist +2 for mobility and ADLs. Patient's medical and functional status update has been discussed with the Rehabilitation physician and patient remains appropriate for inpatient rehabilitation. Will admit to inpatient rehab today.  Preadmission Screen Completed By:  [name], RN, 10/19/2020 12:01 PM ______________________________________________________________________   Discussed status with Dr. Dagoberto Ligas on 10/19/20 at 1000 and received approval for admission today.  Admission Coordinator:  [name], time 1201/Date 10/19/20           Cosigned by: Courtney Heys, MD at 10/19/2020 12:08 PM    Revision History  Note Details  Author [name], RN File Time 10/19/2020 12:02 PM  Author Type Rehab Admission Coordinator Status Signed  Last Editor [name], RN Service Physical Medicine and [place] # [id number] Admit Date 10/19/2020

## 2020-10-19 NOTE — Discharge Summary (Signed)
[facility]  Physician Discharge Summary   Patient ID: Lance [name] MRN: [id number] DOB/AGE: 1946-05-31 75 y.o.  Admit date: 10/14/2020 Discharge date: 10/19/2020  PCP: Theotis Burrow, MD  DISCHARGE DIAGNOSES:  Intracranial hemorrhage Essential hypertension Chronic diastolic CHF Diabetes mellitus type 2, uncontrolled with hyperglycemia Hyperlipidemia Peripheral artery disease History of meningioma History of schizophrenia History of hepatitis C    RECOMMENDATIONS FOR OUTPATIENT FOLLOW UP: Patient is being discharged/transferred to [facility] inpatient rehabilitation  CODE STATUS: Full code  DISCHARGE CONDITION: fair  Diet recommendation: Modified carbohydrate  INITIAL HISTORY: Lance [name] a 75 y.o.malepast medical history of hepatitis C, diabetes, schizophreniapresented to ER for 2 weeks of weakness and difficulty walking.  There was left-sided weakness.  CT scan showed right frontal intraparenchymal hemorrhage.  Patient had poorly controlled blood pressure.  He was admitted to the neuro ICU.   Consultations:  Neurology  Procedures:  None    HOSPITAL COURSE:    Right frontal intracranial hemorrhage  Patient was admitted by the stroke service.  Initially admitted to the intensive care unit.  Patient underwent echocardiogram which showed EF of 70%.  LDL 59.  HbA1c 10.1.  Urine drug screen positive for cocaine.  No antithrombotic agents due to intracranial hemorrhage.  Patient remained stable.  He was transferred to the floor.  Seen by PT and OT.  Inpatient rehabilitation is recommended.  Okay for discharge today.  Essential hypertension Came in with a significantly elevated blood pressure likely due to cocaine abuse and not being on antihypertensives at home.  Initially was on Cleviprex infusion.  Started on amlodipine with reasonable control of blood pressure.  Has occasionally required labetalol as needed.  Cannot go up on the  dose of amlodipine for better blood pressure control.  Chronic diastolic CHF Grade 1 diastolic dysfunction and mild LVH on echo.  Euvolemic.  .  Type 2 diabetes, uncontrolled HbA1c 10.1.    Levemir dose was adjusted with better control of CBGs.  Continue meal coverage as well.  Monitor CBGs.  Hyperlipidemia Started on Lipitor 20 mg Goal LDL < 70 (current LDL 59).  PAD  on statin  Meningioma Known previously and stable on imaging.  Not contributing to his acute neurologic deficits.  Schizophrenia Continue Zyprexa.  Hx of hepatitis C  Outpatient follow-up.  Overall patient is stable.  Okay for discharge/transfer to inpatient rehabilitation.   PERTINENT LABS:  The results of significant diagnostics from this hospitalization (including imaging, microbiology, ancillary and laboratory) are listed below for reference.    Microbiology: Recent Results (from the past 240 hour(s))  Resp Panel by RT-PCR (Flu A&B, Covid) Nasopharyngeal Swab     Status: None   Collection Time: 10/14/20  8:00 PM   Specimen: Nasopharyngeal Swab; Nasopharyngeal(NP) swabs in vial transport medium  Result Value Ref Range Status   SARS Coronavirus 2 by RT PCR NEGATIVE NEGATIVE Final    Comment: (NOTE) SARS-CoV-2 target nucleic acids are NOT DETECTED.  The SARS-CoV-2 RNA is generally detectable in upper respiratory specimens during the acute phase of infection. The lowest concentration of SARS-CoV-2 viral copies this assay can detect is 138 copies/mL. A negative result does not preclude SARS-Cov-2 infection and should not be used as the sole basis for treatment or other patient management decisions. A negative result may occur with  improper specimen collection/handling, submission of specimen other than nasopharyngeal swab, presence of viral mutation(s) within the areas targeted by this assay, and inadequate number of viral copies(<138 copies/mL). A negative result  must be combined with clinical  observations, patient history, and epidemiological information. The expected result is Negative.  Fact Sheet for Patients:  EntrepreneurPulse.com.au  Fact Sheet for Healthcare Providers:  IncredibleEmployment.be  This test is no t yet approved or cleared by the [place] FDA and  has been authorized for detection and/or diagnosis of SARS-CoV-2 by FDA under an Emergency Use Authorization (EUA). This EUA will remain  in effect (meaning this test can be used) for the duration of the COVID-19 declaration under Section 564(b)(1) of the Act, 21 U.S.C.section 360bbb-3(b)(1), unless the authorization is terminated  or revoked sooner.       Influenza A by PCR NEGATIVE NEGATIVE Final   Influenza B by PCR NEGATIVE NEGATIVE Final    Comment: (NOTE) The Xpert Xpress SARS-CoV-2/FLU/RSV plus assay is intended as an aid in the diagnosis of influenza from Nasopharyngeal swab specimens and should not be used as a sole basis for treatment. Nasal washings and aspirates are unacceptable for Xpert Xpress SARS-CoV-2/FLU/RSV testing.  Fact Sheet for Patients: EntrepreneurPulse.com.au  Fact Sheet for Healthcare Providers: IncredibleEmployment.be  This test is not yet approved or cleared by the [place] FDA and has been authorized for detection and/or diagnosis of SARS-CoV-2 by FDA under an Emergency Use Authorization (EUA). This EUA will remain in effect (meaning this test can be used) for the duration of the COVID-19 declaration under Section 564(b)(1) of the Act, 21 U.S.C. section 360bbb-3(b)(1), unless the authorization is terminated or revoked.  Performed at [facility], [address] [place], [place] [address]   MRSA PCR Screening     Status: Abnormal   Collection Time: 10/14/20 10:34 PM   Specimen: Nasal Mucosa; Nasopharyngeal  Result Value Ref Range Status   MRSA by PCR POSITIVE (A) NEGATIVE  Final    Comment:        The GeneXpert MRSA Assay (FDA approved for NASAL specimens only), is one component of a comprehensive MRSA colonization surveillance program. It is not intended to diagnose MRSA infection nor to guide or monitor treatment for MRSA infections. RESULT CALLED TO, READ BACK BY AND VERIFIED WITH: RN B ALI AT 0105 10/15/2020 BY L BENFIELD Performed at [facility], [place] [address] [place], [place] [address]      Labs:  COVID-19 Labs   Lab Results  Component Value Date   SARSCOV2NAA NEGATIVE 10/14/2020   [place] NEGATIVE 08/11/2020   [place] NEGATIVE 06/01/2020   [place] NEGATIVE 05/14/2020      Basic Metabolic Panel: Recent Labs  Lab 10/14/20 1922 10/15/20 0234 10/16/20 0423 10/17/20 0347 10/18/20 0329  NA 137 135 136 136 134*  K 3.8 3.6 3.9 3.9 4.1  CL 103 101 103 104 102  CO2 26 27 24 26 28   GLUCOSE 167* 342* 201* 194* 225*  BUN 17 13 15 17 12   CREATININE 0.75 0.86 0.83 0.87 0.85  CALCIUM 8.9 8.9 8.6* 8.6* 8.6*   Liver Function Tests: Recent Labs  Lab 10/14/20 1922 10/15/20 0234  AST 14* 14*  ALT 13 13  ALKPHOS 81 93  BILITOT 0.6 0.5  PROT 7.4 7.3  ALBUMIN 3.4* 3.1*   CBC: Recent Labs  Lab 10/14/20 1922 10/15/20 0234 10/16/20 0423 10/17/20 0347 10/18/20 0329  WBC 11.1* 13.0* 10.8* 9.4 8.8  NEUTROABS 8.1*  --   --   --   --   HGB 13.2 14.5 14.1 13.6 12.9*  HCT 39.4 43.5 43.4 42.3 39.7  MCV 90.6 91.4 93.5 93.8 92.5  PLT  170 250 223 198 207    CBG: Recent Labs  Lab 10/18/20 1315 10/18/20 1602 10/18/20 2114 10/19/20 0612 10/19/20 0845  GLUCAP 224* 235* 256* 154* 141*     IMAGING STUDIES DG Chest 2 View  Result Date: 10/14/2020 CLINICAL DATA:  Neck pain and left leg weakness. EXAM: CHEST - 2 VIEW COMPARISON:  May 14, 2020 FINDINGS: Chronic appearing increased interstitial lung markings are seen without evidence of acute infiltrate, pleural effusion or pneumothorax. The heart size and  mediastinal contours are within normal limits. Degenerative changes seen involving the right shoulder and thoracic spine. Ill-defined suture like material is seen overlying the left upper quadrant. IMPRESSION: Stable exam without active cardiopulmonary disease. Electronically Signed   By: Virgina Norfolk M.D.   On: 10/14/2020 19:27   CT Head Wo Contrast  Result Date: 10/14/2020 CLINICAL DATA:  Neck pain and left leg weakness. EXAM: CT HEAD WITHOUT CONTRAST TECHNIQUE: Contiguous axial images were obtained from the base of the skull through the vertex without intravenous contrast. COMPARISON:  March 22, 2020 FINDINGS: Brain: There is mild cerebral atrophy with widening of the extra-axial spaces and ventricular dilatation. There are areas of decreased attenuation within the white matter tracts of the supratentorial brain, consistent with microvascular disease changes. A 3.4 cm x 1.7 cm x 2.8 cm lobulated acute parenchymal bleed is seen within the parasagittal portion of the frontoparietal region on the right. Very mild mass effect is seen on the adjacent sulci with no evidence of midline shift. A stable meningioma is seen along the anterior aspect of the anterior cranial fossa with a mild amount of stable mass-effect noted on the right frontal lobe. Vascular: No hyperdense vessel or unexpected calcification. Skull: Normal. Negative for fracture or focal lesion. Sinuses/Orbits: No acute finding. Other: Persistent moderate to marked severity mildly dense subcutaneous inflammatory fat stranding is seen along the posterior fossa on the right. IMPRESSION: 1. 3.4 cm x 1.7 cm x 2.8 cm acute right frontoparietal parenchymal bleed, as described above. MRI correlation is recommended. 2. Stable right anterior cranial fossa meningioma. Electronically Signed   By: Virgina Norfolk M.D.   On: 10/14/2020 19:37   CT Cervical Spine Wo Contrast  Result Date: 10/14/2020 CLINICAL DATA:  Neck pain and left leg weakness. EXAM: CT  CERVICAL SPINE WITHOUT CONTRAST TECHNIQUE: Multidetector CT imaging of the cervical spine was performed without intravenous contrast. Multiplanar CT image reconstructions were also generated. COMPARISON:  None. FINDINGS: Alignment: Normal. Skull base and vertebrae: No acute fracture. Chronic changes are seen involving the body and tip of the dens as well as the anterior arch of C1. Soft tissues and spinal canal: No prevertebral fluid or swelling. No visible canal hematoma. Disc levels: Marked severity endplate sclerosis is seen at the levels of C3-C4, C4-C5 and C5-C6. Moderate to marked severity intervertebral disc space narrowing is seen at the level of C5-C6. Marked severity bilateral multilevel facet joint hypertrophy is noted. Upper chest: Negative. Other: None. IMPRESSION: 1. No evidence of acute cervical spine fracture or subluxation. 2. Marked severity multilevel degenerative changes. Electronically Signed   By: Virgina Norfolk M.D.   On: 10/14/2020 19:41   MR BRAIN W WO CONTRAST  Result Date: 10/15/2020 CLINICAL DATA:  75 year old male with superior right hemisphere intra-axial hemorrhage on presentation head CT for left leg weakness. Suspicion of amyloid angiopathy, and also demonstration of a 6 cm right anterior frontal convexity meningioma invading and occluding the anterior segment of the superior sagittal sinus on December MRI. EXAM:  MRI HEAD WITHOUT AND WITH CONTRAST TECHNIQUE: Multiplanar, multiecho pulse sequences of the brain and surrounding structures were obtained without and with intravenous contrast. CONTRAST:  7.57mL GADAVIST GADOBUTROL 1 MMOL/ML IV SOLN COMPARISON:  Head and cervical spine CT 10/14/2020. Brain MRI 05/15/2020. FINDINGS: Brain: Right superior frontal gyrus motor strip intra-axial hemorrhage encompassing 41 by 22 x 33 mm (AP by transverse by CC), estimated volume 15 mL) with mostly hyperintense T2 and isointense T1 signal. Surrounding edema is contiguous with other chronic  white matter T2 and FLAIR hyperintensity. There is only mild regional mass effect. Abnormal diffusion associated with the blood products, no larger area of restricted diffusion. No contrast enhancement. No diffusion restriction elsewhere in the brain. The previous small bilateral subdural hematomas seen in December appear resolved. Superimposed anterior right frontal convexity en plaque type meningioma appears stable and size and configuration since December, roughly 6.6 cm long axis, 1.9 cm thickness. Stable mass effect on the underlying brain with no convincing cerebral edema. Innumerable micro hemorrhages on SWI redemonstrated in addition to the acute parenchymal hemorrhage (series 14, image 43). Stable gray and white matter signal elsewhere. No ventriculomegaly or intraventricular hemorrhage. Basilar cisterns remain normal. Negative pituitary and cervicomedullary junction. No other abnormal intracranial enhancement. Vascular: Major intracranial vascular flow voids are stable since December, including loss of the anterior but preserved posterior superior sagittal sinus dural venous flow void. And the other major dural venous sinuses are enhancing and appear to be patent following contrast today. Skull and upper cervical spine: Stable, negative for age. Sinuses/Orbits: Stable, negative. Other: Mastoids remain clear. Visible internal auditory structures appear normal. IMPRESSION: 1. Stable intra-axial hemorrhage in the right superior frontal gyrus/motor strip since yesterday, estimated volume 15 mL. 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 Mild surrounding edema. No significant mass effect or other complicating features. 2. Stable right anterior frontal convexity en plaque Meningioma since December. Anterior segment of the superior sagittal sinus occluded as before but the other dural venous sinuses remain patent. 3. Small bilateral subdural hematomas have resolved  since December. Electronically Signed   By: Genevie Ann M.D.   On: 10/15/2020 11:28   ECHOCARDIOGRAM COMPLETE  Result Date: 10/15/2020    ECHOCARDIOGRAM REPORT   Patient Name:   VERNEL LANGENDERFER Date of Exam: 10/15/2020 Medical Rec #:  [id number]        Height:       69.0 in Accession #:    [id number]       Weight:       169.8 lb Date of Birth:  September 18, 1945        BSA:          1.927 m Patient Age:    36 years         BP:           122/77 mmHg Patient Gender: M                HR:           83 bpm. Exam Location:  Inpatient Procedure: 2D Echo Indications:    stroke  History:        Patient has prior history of Echocardiogram examinations, most                 recent 05/15/2020. Hepatitis C; Risk Factors:Diabetes and IV drug                 use.  Sonographer:    Ander Purpura  [place] Referring Phys: [id number] ASHISH ARORA IMPRESSIONS  1. Left ventricular ejection fraction, by estimation, is 70 to 75%. The left ventricle has hyperdynamic function. The left ventricle has no regional wall motion abnormalities. There is mild concentric left ventricular hypertrophy. Left ventricular diastolic parameters are consistent with Grade I diastolic dysfunction (impaired relaxation).  2. Right ventricular systolic function is normal. The right ventricular size is normal.  3. The mitral valve is normal in structure. No evidence of mitral valve regurgitation. No evidence of mitral stenosis.  4. The aortic valve is normal in structure. Aortic valve regurgitation is not visualized. No aortic stenosis is present.  5. The inferior vena cava is normal in size with greater than 50% respiratory variability, suggesting right atrial pressure of 3 mmHg. FINDINGS  Left Ventricle: Left ventricular ejection fraction, by estimation, is 70 to 75%. The left ventricle has hyperdynamic function. The left ventricle has no regional wall motion abnormalities. The left ventricular internal cavity size was normal in size. There is mild concentric left ventricular  hypertrophy. Left ventricular diastolic parameters are consistent with Grade I diastolic dysfunction (impaired relaxation). Right Ventricle: The right ventricular size is normal. No increase in right ventricular wall thickness. Right ventricular systolic function is normal. Left Atrium: Left atrial size was normal in size. Right Atrium: Right atrial size was normal in size. Prominent Eustachian valve. Pericardium: There is no evidence of pericardial effusion. Mitral Valve: The mitral valve is normal in structure. No evidence of mitral valve regurgitation. No evidence of mitral valve stenosis. Tricuspid Valve: The tricuspid valve is normal in structure. Tricuspid valve regurgitation is not demonstrated. No evidence of tricuspid stenosis. Aortic Valve: The aortic valve is normal in structure. Aortic valve regurgitation is not visualized. No aortic stenosis is present. Pulmonic Valve: The pulmonic valve was normal in structure. Pulmonic valve regurgitation is not visualized. No evidence of pulmonic stenosis. Aorta: The aortic root is normal in size and structure. Venous: The inferior vena cava is normal in size with greater than 50% respiratory variability, suggesting right atrial pressure of 3 mmHg. IAS/Shunts: No atrial level shunt detected by color flow Doppler.  LEFT VENTRICLE PLAX 2D LVIDd:         4.20 cm  Diastology LVIDs:         2.80 cm  LV e' medial:    6.20 cm/s LV PW:         1.40 cm  LV E/e' medial:  8.6 LV IVS:        1.40 cm  LV e' lateral:   9.25 cm/s LVOT diam:     2.10 cm  LV E/e' lateral: 5.8 LVOT Area:     3.46 cm  RIGHT VENTRICLE             IVC RV S prime:     18.50 cm/s  IVC diam: 1.70 cm TAPSE (M-mode): 2.3 cm LEFT ATRIUM             Index       RIGHT ATRIUM           Index LA diam:        3.40 cm 1.76 cm/m  RA Area:     16.70 cm LA Vol (A2C):   56.4 ml 29.27 ml/m RA Volume:   42.60 ml  22.11 ml/m LA Vol (A4C):   36.1 ml 18.73 ml/m LA Biplane Vol: 46.1 ml 23.92 ml/m   AORTA Ao Root diam:  3.20 cm MITRAL VALVE MV Area (PHT): 2.56 cm  SHUNTS MV Decel Time: 296 msec    Systemic Diam: 2.10 cm MV E velocity: 53.60 cm/s MV A velocity: 75.40 cm/s MV E/A ratio:  0.71 Mihai Croitoru MD Electronically signed by Sanda Klein MD Signature Date/Time: 10/15/2020/3:53:16 PM    Final     DISCHARGE EXAMINATION: Vitals:   10/18/20 1959 10/18/20 2350 10/19/20 0435 10/19/20 0805  BP: 136/79 133/89 (!) 164/84 (!) 155/90  Pulse:    70  Resp: (!) 21 15 15 14   Temp: 98.4 F (36.9 C) 98 F (36.7 C) 98.3 F (36.8 C) 97.9 F (36.6 C)  TempSrc: Oral Axillary Axillary Oral  SpO2:    100%  Weight:      Height:       General appearance: Awake alert.  In no distress Resp: Clear to auscultation bilaterally.  Normal effort Cardio: S1-S2 is normal regular.  No S3-S4.  No rubs murmurs or bruit GI: Abdomen is soft.  Nontender nondistended.  Bowel sounds are present normal.  No masses organomegaly    DISPOSITION: CIR     Current Inpatient Medications:  Scheduled: .  stroke: mapping our early stages of recovery book   Does not apply Once  . [START ON 10/20/2020] amLODipine  10 mg Oral Daily  . insulin aspart  0-15 Units Subcutaneous TID WC  . insulin aspart  0-5 Units Subcutaneous QHS  . insulin aspart  7 Units Subcutaneous TID WC  . insulin detemir  22 Units Subcutaneous QHS  . mupirocin ointment  1 application Nasal BID  . pantoprazole (PROTONIX) IV  40 mg Intravenous QHS  . senna-docusate  1 tablet Oral BID   Continuous:  [id number] **OR** acetaminophen (TYLENOL) oral liquid 160 mg/5 mL **OR** acetaminophen, labetalol    TOTAL DISCHARGE TIME: 35 minutes  Byrant Valent [employer] on [email or web address]  10/19/2020, 10:18 AM

## 2020-10-20 DIAGNOSIS — I619 Nontraumatic intracerebral hemorrhage, unspecified: Secondary | ICD-10-CM

## 2020-10-20 LAB — COMPREHENSIVE METABOLIC PANEL
ALT: 15 U/L (ref 0–44)
AST: 11 U/L — ABNORMAL LOW (ref 15–41)
Albumin: 2.9 g/dL — ABNORMAL LOW (ref 3.5–5.0)
Alkaline Phosphatase: 83 U/L (ref 38–126)
Anion gap: 7 (ref 5–15)
BUN: 18 mg/dL (ref 8–23)
CO2: 28 mmol/L (ref 22–32)
Calcium: 9.1 mg/dL (ref 8.9–10.3)
Chloride: 101 mmol/L (ref 98–111)
Creatinine, Ser: 0.94 mg/dL (ref 0.61–1.24)
GFR, Estimated: >60 mL/min (ref >60)
Glucose, Bld: 191 mg/dL — ABNORMAL HIGH (ref 70–99)
Potassium: 4.3 mmol/L (ref 3.5–5.1)
Sodium: 136 mmol/L (ref 135–145)
Total Bilirubin: 0.5 mg/dL (ref 0.3–1.2)
Total Protein: 6.8 g/dL (ref 6.5–8.1)

## 2020-10-20 LAB — CBC WITH DIFFERENTIAL/PLATELET
Abs Immature Granulocytes: 0.06 10*3/uL (ref 0.00–0.07)
Basophils Absolute: 0 10*3/uL (ref 0.0–0.1)
Basophils Relative: 0 %
Eosinophils Absolute: 0.1 10*3/uL (ref 0.0–0.5)
Eosinophils Relative: 1 %
HCT: 41.2 % (ref 39.0–52.0)
Hemoglobin: 13.7 g/dL (ref 13.0–17.0)
Immature Granulocytes: 1 %
Lymphocytes Relative: 18 %
Lymphs Abs: 1.7 10*3/uL (ref 0.7–4.0)
MCH: 30.5 pg (ref 26.0–34.0)
MCHC: 33.3 g/dL (ref 30.0–36.0)
MCV: 91.8 fL (ref 80.0–100.0)
Monocytes Absolute: 0.8 10*3/uL (ref 0.1–1.0)
Monocytes Relative: 8 %
Neutro Abs: 6.7 10*3/uL (ref 1.7–7.7)
Neutrophils Relative %: 72 %
Platelets: 226 10*3/uL (ref 150–400)
RBC: 4.49 MIL/uL (ref 4.22–5.81)
RDW: 12.4 % (ref 11.5–15.5)
WBC: 9.4 10*3/uL (ref 4.0–10.5)
nRBC: 0 % (ref 0.0–0.2)

## 2020-10-20 LAB — GLUCOSE, CAPILLARY
Glucose-Capillary: 182 mg/dL — ABNORMAL HIGH (ref 70–99)
Glucose-Capillary: 162 mg/dL — ABNORMAL HIGH (ref 70–99)
Glucose-Capillary: 232 mg/dL — ABNORMAL HIGH (ref 70–99)
Glucose-Capillary: 192 mg/dL — ABNORMAL HIGH (ref 70–99)

## 2020-10-20 NOTE — Progress Notes (Signed)
Inpatient Rehabilitation Care Coordinator Assessment and Plan Patient Details  Name: Lance [name] MRN: [id number] Date of Birth: August 05, 1945  Today's Date: 10/20/2020  Hospital Problems: Active Problems:   Intraparenchymal hemorrhage of brain [facility])  Past Medical History:  Past Medical History:  Diagnosis Date  . Diabetes mellitus without complication ([place])   . Hepatitis C   . Schizophrenia [facility])    Past Surgical History:  Past Surgical History:  Procedure Laterality Date  . COLONOSCOPY WITH PROPOFOL N/A 08/15/2020   Procedure: COLONOSCOPY WITH PROPOFOL;  Surgeon: Lucilla Lame, MD;  Location: [facility] ENDOSCOPY;  Service: Endoscopy;  Laterality: N/A;  . ESOPHAGOGASTRODUODENOSCOPY (EGD) WITH PROPOFOL N/A 10/10/2016   Procedure: ESOPHAGOGASTRODUODENOSCOPY (EGD) WITH PROPOFOL;  Surgeon: Lucilla Lame, MD;  Location: [facility] ENDOSCOPY;  Service: Endoscopy;  Laterality: N/A;  . HERNIA REPAIR    . VISCERAL ARTERY INTERVENTION N/A 10/11/2016   Procedure: Visceral Artery Intervention;  Surgeon: Algernon Huxley, MD;  Location: [place] CV LAB;  Service: Cardiovascular;  Laterality: N/A;   Social History:  reports that he has been smoking. He has a 2.50 pack-year smoking history. He has never used smokeless tobacco. He reports previous alcohol use of about 10.0 standard drinks of alcohol per week. He reports that he does not use drugs.  Family / Support Systems Marital Status: Single Children: [name] (DTR) Anticipated Caregiver: Gid Schoffstall Ability/Limitations of Caregiver: Dtr and family to arrange 24/7 Caregiver Availability: 24/7  Social History Preferred language: English Religion: Christian Reformed Read: Yes Write: Yes Guardian/Conservator: Velda Shell   Abuse/Neglect Abuse/Neglect Assessment Can Be Completed: Yes Physical Abuse: Denies Verbal Abuse: Denies Sexual Abuse: Denies Exploitation of patient/patient's resources: Denies Self-Neglect: Denies  Emotional  Status Pt's affect, behavior and adjustment status: coping, substance Recent Psychosocial Issues: substance abuse Psychiatric History: Schizophrenia Substance Abuse History: Alcohol and Polysubstance  Patient / Family Perceptions, Expectations & Goals Pt/Family understanding of illness & functional limitations: yes Premorbid pt/family roles/activities: Previously required assistance with ADLs and medications. Pt limited in the community, family drives pt to complete errands Anticipated changes in roles/activities/participation: Family will cont to assit pt Pt/family expectations/goals: Min/Mod-Supervision  [employer]: None Premorbid Home Care/DME Agencies: Other (Comment) [employer] forces training and education officer, [place]) Transportation available at discharge: Family able to transport Resource referrals recommended: Neuropsychology (Substance, coping)  Discharge Planning Living Arrangements: Alone Support Systems: Children Type of Residence: Private residence (1 level home, 3 steps to enter) Insurance Resources: Medicaid (specify county),Medicare Actor) Financial Screen Referred: No Living Expenses: Education officer, community Management: Patient Does the patient have any problems obtaining your medications?: No (Cost is a concern) Home Management: Pt recieved some assistance with medication management Patient/Family Preliminary Plans: Family to continue Care Coordinator Barriers to Discharge: Insurance for SNF coverage,Medication compliance Care Coordinator Anticipated Follow Up Needs: HH/OP Expected length of stay: 23-25 Days  Clinical Impression Sw entered pt room, assessment began- pt fell asleep. Called pt dtr, [name] to cont. Introduced self, explained role and addressed questions and concerns. Dtr [name]) is currently out of town until 5/22. [name] would like other daughter Katy Apo) to discuss discharge planning until she returns. [name] or [name] have not been provided  past updates of pt's care. Dtr [name]) is a LPN and was unaware of her father having a stroke. Dtr was informed pt was being transported to the ED and has not received updates since. Sw added dtr (Cassandra) contact information to pt chart. Made attempt to call Cassandra, VM not set up- unable to leave VM, sw will  cont to follow up.  [name] 10/20/2020, 12:35 PM

## 2020-10-20 NOTE — Progress Notes (Signed)
[facility] Individual Statement of Services  Patient Name:  Lance [name]  Date:  10/20/2020  Welcome to the [facility].  Our goal is to provide you with an individualized program based on your diagnosis and situation, designed to meet your specific needs.  With this comprehensive rehabilitation program, you will be expected to participate in at least 3 hours of rehabilitation therapies Monday-Friday, with modified therapy programming on the weekends.  Your rehabilitation program will include the following services:  Physical Therapy (PT), Occupational Therapy (OT), Speech Therapy (ST), 24 hour per day rehabilitation nursing, Therapeutic Recreaction (TR), Neuropsychology, Care Coordinator, Rehabilitation Medicine, Nutrition Services, Pharmacy Services and Other  Weekly team conferences will be held on Wednesdays to discuss your progress.  Your Inpatient Rehabilitation Care Coordinator will talk with you frequently to get your input and to update you on team discussions.  Team conferences with you and your family in attendance may also be held.  Expected length of stay: 23-25 Days  Overall anticipated outcome: Min/Mod  Depending on your progress and recovery, your program may change. Your Inpatient Rehabilitation Care Coordinator will coordinate services and will keep you informed of any changes. Your Inpatient Rehabilitation Care Coordinator's name and contact numbers are listed  below.  The following services may also be recommended but are not provided by the [facility]:    [place] will be made to provide these services after discharge if needed.  Arrangements include referral to agencies that provide these services.  Your insurance has been verified to be:  [employer] Your primary doctor is:  Revelo, Elyse Jarvis, MD  Pertinent  information will be shared with your doctor and your insurance company.  Inpatient Rehabilitation Care Coordinator:  Erlene Quan, [place] or [phone]  Information discussed with and copy given to patient by: [name], 10/20/2020, 10:51 AM

## 2020-10-20 NOTE — Evaluation (Signed)
Physical Therapy Assessment and Plan  Patient Details  Name: Lance [name] MRN: [id number] Date of Birth: 10/16/1945  PT Diagnosis: Abnormal posture, Abnormality of gait, Cognitive deficits, Difficulty walking, Hemiplegia non-dominant, Hypertonia, Impaired cognition, Impaired sensation, Muscle weakness and Pain in neck Rehab Potential: Fair ELOS: 3.5-4 weeks   Today's Date: 10/20/2020 PT Individual Time: 1306-1400 PT Individual Time Calculation (min): 54 min    Hospital Problem: Active Problems:   Intraparenchymal hemorrhage of brain [facility])   Past Medical History:  Past Medical History:  Diagnosis Date  . Diabetes mellitus without complication ([place])   . Hepatitis C   . Schizophrenia [facility])    Past Surgical History:  Past Surgical History:  Procedure Laterality Date  . COLONOSCOPY WITH PROPOFOL N/A 08/15/2020   Procedure: COLONOSCOPY WITH PROPOFOL;  Surgeon: Lucilla Lame, MD;  Location: [facility] ENDOSCOPY;  Service: Endoscopy;  Laterality: N/A;  . ESOPHAGOGASTRODUODENOSCOPY (EGD) WITH PROPOFOL N/A 10/10/2016   Procedure: ESOPHAGOGASTRODUODENOSCOPY (EGD) WITH PROPOFOL;  Surgeon: Lucilla Lame, MD;  Location: [facility] ENDOSCOPY;  Service: Endoscopy;  Laterality: N/A;  . HERNIA REPAIR    . VISCERAL ARTERY INTERVENTION N/A 10/11/2016   Procedure: Visceral Artery Intervention;  Surgeon: Algernon Huxley, MD;  Location: [facility] CV LAB;  Service: Cardiovascular;  Laterality: N/A;    Assessment & Plan Clinical Impression: Patient is a 75 y.o. right-handed male with history of diabetes mellitus, hepatitis C and schizophrenia as well as alcohol and polysubstance use. Per chart review patient lives alone. 1 level home 3 steps to entry. Used a Rollator for the past few weeks due to gait dysfunction. Presented 10/14/2020 with weakness x2 weeks especially left lower extremity with gait disorder. CT of the head showed a 3.4 x 1.7 x 2.8 acute right frontal parietal parenchymal bleed. CT cervical spine  negative. MRI follow-up showed stable intra-axial hemorrhage in the right superior frontal gyrus motor strip. Stable right anterior frontal convexity en plaque meningioma since December with comparison of prior imaging. Small bilateral subdural hematomas resolved since earlier tracings of December. Prior MRIs were also suggestive of chronic microangiopathy and mild hemorrhages concerning for possible amyloid angiopathy. Echocardiogram with ejection fraction of 70 to 50% grade 1 diastolic dysfunction. Admission chemistries unremarkable except glucose 167 alcohol negative urine drug screen positive cocaine. Neurology follow-up conservative care close monitoring of blood pressure. Tolerating a regular consistency diet. Due to patient's decreased functional ability was admitted for a comprehensive rehab program. Patient transferred to CIR on 10/19/2020 .   Patient currently requires +2 total assist with mobility secondary to muscle weakness, muscle joint tightness and muscle paralysis, decreased cardiorespiratoy endurance, impaired timing and sequencing, abnormal tone, unbalanced muscle activation and decreased motor planning, decreased midline orientation, decreased initiation, decreased attention, decreased awareness, decreased problem solving, decreased safety awareness, decreased memory and delayed processing and decreased sitting balance, decreased standing balance, decreased postural control, hemiplegia and decreased balance strategies.  Prior to hospitalization, patient was min assist with mobility and lived with Alone in a House home.  Home access is 4Stairs to enter.  Patient will benefit from skilled PT intervention to maximize safe functional mobility, minimize fall risk and decrease caregiver burden for planned discharge home with 24 hour assist.  Anticipate patient will benefit from follow up [facility] at discharge.  PT - End of Session Activity Tolerance: Tolerates 30+ min activity with multiple  rests Endurance Deficit: Yes Endurance Deficit Description: requires supported seated rest breaks PT Assessment Rehab Potential (ACUTE/IP ONLY): Fair PT Barriers to Discharge: [place] home environment;Decreased caregiver  support;Home environment access/layout;Lack of/limited family support PT Patient demonstrates impairments in the following area(s): Balance;Perception;Behavior;Safety;Edema;Sensory;Endurance;Skin Integrity;Motor;Nutrition;Pain PT Transfers Functional Problem(s): Bed Mobility;Bed to Chair;Car;Furniture PT Locomotion Functional Problem(s): Ambulation;Wheelchair Mobility;Stairs PT Plan PT Intensity: Minimum of 1-2 x/day ,45 to 90 minutes PT Frequency: 5 out of 7 days PT Duration Estimated Length of Stay: 3.5-4 weeks PT Treatment/Interventions: Ambulation/gait training;Community reintegration;DME/adaptive equipment instruction;Neuromuscular re-education;Psychosocial support;Stair training;UE/LE Strength taining/ROM;Wheelchair propulsion/positioning;Balance/vestibular training;Discharge planning;Functional electrical stimulation;Pain management;Skin care/wound management;Therapeutic Activities;UE/LE Coordination activities;Disease management/prevention;Cognitive remediation/compensation;Functional mobility training;Patient/family education;Splinting/orthotics;Therapeutic Exercise;Visual/perceptual remediation/compensation PT Transfers Anticipated Outcome(s): mod assist PT Locomotion Anticipated Outcome(s): max assist PT Recommendation Follow Up Recommendations: Home health PT;24 hour supervision/assistance Patient destination: Home Equipment Recommended: To be determined   PT Evaluation Precautions/Restrictions Precautions Precautions: Fall;Other (comment) Precaution Comments: L hemiplegia Restrictions Weight Bearing Restrictions: No Pain Pain Assessment Pain Scale: 0-10 Pain Score: 10-Worst pain ever Faces Pain Scale: Hurts little more Pain Type: Acute pain Pain  Location: Neck Pain Orientation: Posterior Pain Descriptors / Indicators: Aching Pain Onset: On-going Pain Intervention(s): Medication (See eMAR);Relaxation;Rest;Distraction;Emotional support;Repositioned (premedicated with muscle rub) Home Living/Prior Functioning Home Living Living Arrangements: Alone Available Help at Discharge: Family;Available 24 hours/day;Other (Comment);Personal care attendant (daughter, [name], and a PCA that comes M-F for ~3hours) Type of Home: House Home Access: Stairs to enter [employer] of Steps: 4 Entrance Stairs-Rails: Right;Left;Can reach both Home Layout: One level  Lives With: Alone Prior Function Level of Independence: Requires assistive device for independence;Needs assistance with tranfers;Needs assistance with gait (reports using a cane)  Able to Take Stairs?: Yes (with assistance) Driving: No Vocation: Retired Cytogeneticist: Impaired Spatial Orientation: impaired midline orientation Praxis Praxis: Impaired Praxis Impairment Details: Motor planning;Initiation  Cognition  Overall Cognitive Status: Impaired/Different from baseline Arousal/Alertness: Awake/alert Orientation Level: Oriented to person;Oriented to place;Oriented to situation;Disoriented to time (states "I had a brain infection and fell about 5 times" states it is 2002) Attention: Focused Focused Attention: Appears intact Sustained Attention: Impaired Selective Attention: Impaired Memory: Impaired Awareness: Impaired Problem Solving: Impaired Initiating: Impaired Safety/Judgment: Impaired Sensation Sensation Light Touch: Impaired Detail Light Touch Impaired Details: Impaired LLE Hot/Cold: Not tested Proprioception: Impaired Detail Proprioception Impaired Details: Impaired LLE Stereognosis: Not tested Coordination Gross Motor Movements are Fluid and Coordinated: No Coordination and Movement Description: impaired due to L hemiplegia, poor  trunk control, and impaired midline orientation Heel Shin Test: not able to do with L LE due to paresis Motor  Motor Motor: Abnormal tone;Abnormal postural alignment and control;Hemiplegia Motor - Skilled Clinical Observations: L hemiplegia, impaired trunk control   Trunk/Postural Assessment  Cervical Assessment Cervical Assessment: Exceptions to [facility] (rigid with L posterior cervical muscle hypertonia - pt avoids any neck movement at this time due to pain stating it "pops" when he moves) Thoracic Assessment Thoracic Assessment: Exceptions to [facility] (scapular protraction, rigid trunk) Lumbar Assessment Lumbar Assessment: Exceptions to [facility] (posterior pelvic tilt in sitting) Postural Control Postural Control: Deficits on evaluation Trunk Control: impaired with L lean/pushing tendencies Righting Reactions: impaired with poor motor planning and delayed Protective Responses: delayed and inadequate with poor motor planning  Balance Balance Balance Assessed: Yes Static Sitting Balance Static Sitting - Balance Support: Feet supported Static Sitting - Level of Assistance: 2: Max assist Dynamic Sitting Balance Dynamic Sitting - Balance Support: Feet supported Dynamic Sitting - Level of Assistance: 1: +1 Total assist Sitting balance - Comments: repeated L lean/LOB with possible pushing and impaired righting reactions Extremity Assessment      RLE Assessment RLE Assessment: Exceptions to [facility] Active Range of Motion (  AROM) Comments: WFL General Strength Comments: assessed in supine RLE Strength Right Hip Flexion: 4/5 Right Knee Flexion: 4+/5 Right Knee Extension: 4+/5 Right Ankle Dorsiflexion: 4+/5 Right Ankle Plantar Flexion: 4+/5 LLE Assessment LLE Assessment: Exceptions to [facility] Passive Range of Motion (PROM) Comments: decreased ankle DF ROM LLE Strength Left Hip Flexion: 0/5 Left Hip ABduction: 0/5 Left Hip ADduction: 0/5 Left Knee Flexion: 0/5 Left Knee Extension: 0/5 Left Ankle  Dorsiflexion: 0/5 Left Ankle Plantar Flexion: 0/5 LLE Tone LLE Tone: Moderate;Hypertonic;Modified Ashworth Body Part - Modified Ashworth Scale: Quadriceps;Hamstrings Hamstrings - Modified Ashworth Scale for Grading Hypertonia LLE: No increase in muscle tone Quadriceps - Modified Ashworth Scale for Grading Hypertonia LLE: Slight increase in muscle tone, manifested by a catch, followed by minimal resistance throughout the remainder (less than half) of the ROM  Care Tool Care Tool Bed Mobility Roll left and right activity   Roll left and right assist level: Total Assistance - Patient < 25%    Sit to lying activity   Sit to lying assist level: 2 Helpers    Lying to sitting edge of bed activity   Lying to sitting edge of bed assist level: 2 Helpers     Care Tool Transfers Sit to stand transfer Sit to stand activity did not occur: Safety/medical concerns     Chair/bed transfer Chair/bed transfer activity did not occur: Safety/medical concerns       Psychologist, counselling transfer activity did not occur: Safety/medical concerns        Care Tool Locomotion Ambulation Ambulation activity did not occur: Safety/medical concerns        Walk 10 feet activity Walk 10 feet activity did not occur: Safety/medical concerns       Walk 50 feet with 2 turns activity Walk 50 feet with 2 turns activity did not occur: Safety/medical concerns      Walk 150 feet activity Walk 150 feet activity did not occur: Safety/medical concerns      Walk 10 feet on uneven surfaces activity Walk 10 feet on uneven surfaces activity did not occur: Safety/medical concerns      Stairs Stair activity did not occur: Safety/medical concerns        Walk up/down 1 step activity Walk up/down 1 step or curb (drop down) activity did not occur: Safety/medical concerns     Walk up/down 4 steps activity did not occuR: Safety/medical concerns  Walk up/down 4 steps activity      Walk up/down 12  steps activity Walk up/down 12 steps activity did not occur: Safety/medical concerns      Pick up small objects from floor Pick up small object from the floor (from standing position) activity did not occur: Safety/medical concerns      Wheelchair Will patient use wheelchair at discharge?: Yes (TBD but likely)          Wheel 50 feet with 2 turns activity      Wheel 150 feet activity        Refer to Care Plan for Long Term Goals  SHORT TERM GOAL WEEK 1 PT Short Term Goal 1 (Week 1): Pt will perform rolling R/L in bed with max assist of 1 PT Short Term Goal 2 (Week 1): Pt will perform supine>sitting EOB with max assist of 2 PT Short Term Goal 3 (Week 1): Pt will perform sit>supine with mod assist of 2 PT Short Term Goal 4 (Week 1): Pt will perform bed<>chair transfer  with +2 max assist using equipment as needed  Recommendations for other services: None   Skilled Therapeutic Intervention Evaluation completed (see details above) with patient education regarding purpose of PT evaluation, PT POC and goals, therapy schedule, weekly team meetings, and other CIR information including safety plan and fall risk safety. Pt demonstrates impaired L hemibody sensation of light touch and proprioception as well as hemiplegia with pt demoing no activation in L LE. Patient reports significant L posterior neck pain and noted to have increased tone with muscular tightness palpated - pt also noted to guard his neck avoiding any movement. Pt notes to have very rigid trunk as well. Patient completed the below functional mobility tasks with the specified level of assistance. Pt tolerates sitting EOB ~15-53mnutes demonstrating repeated L lateral lean/LOB/pushing with significantly delayed righting reactions and poor motor planning to maintain upright requiring max to total assist for sitting balance. Unsafe to attempt further OOB mobility at this time. Therapist provides pt with TIS wheelchair and full lap tray  to promote increased upright, OOB activity tolerance in future sessions. Pt left supine in bed with needs in reach and bed alarm on - pt noted to quickly be falling asleep.  Mobility Bed Mobility Bed Mobility: Supine to Sit;Sit to Supine;Rolling Right;Rolling Left Rolling Right: Total Assistance - Patient < 25% Rolling Left: Maximal Assistance - Patient 25-49% Supine to Sit: Total Assistance - Patient < 25% Sit to Supine: Total Assistance - Patient < 25% Transfers Transfers:  (Not safe to attempt at this time with no +2 assist available) Locomotion  Gait Ambulation: No Gait Gait: No Stairs / Additional Locomotion Stairs: No Wheelchair Mobility Wheelchair Mobility: No   Discharge Criteria: Patient will be discharged from PT if patient refuses treatment 3 consecutive times without medical reason, if treatment goals not met, if there is a change in medical status, if patient makes no progress towards goals or if patient is discharged from hospital.  The above assessment, treatment plan, treatment alternatives and goals were discussed and mutually agreed upon: by patient  CTawana Scale, PT, DPT, CSRS  10/20/2020, 12:17 PM

## 2020-10-20 NOTE — Progress Notes (Signed)
PROGRESS NOTE   Subjective/Complaints:  Incont of bowel an dbladder [place] with OT Pushes to Left side heavily   ROS- limited due to cognition   Objective:   No results found. Recent Labs    10/18/20 0329 10/20/20 0532  WBC 8.8 9.4  HGB 12.9* 13.7  HCT 39.7 41.2  PLT 207 226   Recent Labs    10/18/20 0329 10/20/20 0532  NA 134* 136  K 4.1 4.3  CL 102 101  CO2 28 28  GLUCOSE 225* 191*  BUN 12 18  CREATININE 0.85 0.94  CALCIUM 8.6* 9.1    Intake/Output Summary (Last 24 hours) at 10/20/2020 0836 Last data filed at 10/20/2020 0545 Gross per 24 hour  Intake 240 ml  Output 1200 ml  Net -960 ml        Physical Exam: Vital Signs Blood pressure (!) 160/93, pulse 75, temperature 97.8 F (36.6 C), temperature source Oral, resp. rate 14, weight 77.9 kg, SpO2 100 %.  General: No acute distress Mood and affect are appropriate Heart: Regular rate and rhythm no rubs murmurs or extra sounds Lungs: Clear to auscultation, breathing unlabored, no rales or wheezes Abdomen: Positive bowel sounds, soft nontender to palpation, nondistended Extremities: No clubbing, cyanosis, or edema Skin: No evidence of breakdown, no evidence of rash Neurologic: Cranial nerves II through XII intact, motor strength is 5/5 in right  deltoid, bicep, tricep, grip, hip flexor, knee extensors, ankle dorsiflexor and plantar flexor 3- L finger flexors and trace delt, no active movemnt LLE,  + LLE flexor withdrawl Sensory exam normal sensation to light touch and proprioception in bilateral upper and lower extremities Cerebellar exam normal finger to nose to finger as well as heel to shin in bilateral upper and lower extremities Musculoskeletal: Full range of motion in all 4 extremities. No joint swelling    Assessment/Plan: 1. Functional deficits which require 3+ hours per day of interdisciplinary therapy in a comprehensive inpatient rehab  setting.  Physiatrist is providing close team supervision and 24 hour management of active medical problems listed below.  Physiatrist and rehab team continue to assess barriers to discharge/monitor patient progress toward functional and medical goals  Care Tool:  Bathing              Bathing assist       Upper Body Dressing/Undressing Upper body dressing   What is the patient wearing?: Hospital gown only    Upper body assist Assist Level: Maximal Assistance - Patient 25 - 49%    Lower Body Dressing/Undressing Lower body dressing      What is the patient wearing?: Hospital gown only,Incontinence brief     Lower body assist Assist for lower body dressing: 2 Helpers     Toileting Toileting    Toileting assist Assist for toileting: 2 Helpers     Transfers Chair/bed transfer  Transfers assist     Chair/bed transfer assist level: 2 Helpers     Locomotion Ambulation   Ambulation assist              Walk 10 feet activity   Assist           Walk 50 feet  activity   Assist           Walk 150 feet activity   Assist           Walk 10 feet on uneven surface  activity   Assist           Wheelchair     Assist               Wheelchair 50 feet with 2 turns activity    Assist            Wheelchair 150 feet activity     Assist          Blood pressure (!) 160/93, pulse 75, temperature 97.8 F (36.6 C), temperature source Oral, resp. rate 14, weight 77.9 kg, SpO2 100 %.  Medical Problem List and Plan: 1.Left hemiplegia and dysarthriasecondary to right intraparenchymal hemorrhage on the right frontal parietal area likely due to hypertension as well as cocaine use and suggestion of amyloid angiopathy -patient may shower -ELOS/Goals: 23-25 days- min-mod A and S for SLP 2. Antithrombotics: -DVT/anticoagulation:SCDs -antiplatelet therapy: N/A 3. Pain  Management:Tylenol as needed -pt reports tylenol doesn't work - suggest Tramadol? 4. Mood/schizophrenia:Provide emotional support -antipsychotic agents: N/A 5. Neuropsych: This patientiscapable of making decisions on hisown behalf. 6. Skin/Wound Care:Routine skin checks 7. Fluids/Electrolytes/Nutrition:Routine in and outs with follow-up chemistries 8. Hypertension. Norvasc 10mg  daily Vitals:   10/19/20 1929 10/20/20 0542  BP: (!) 157/86 (!) 160/93  Pulse: 83 75  Resp: 16 14  Temp: 98.2 F (36.8 C) 97.8 F (36.6 C)  SpO2: 100% 100%  GOal <299 mmHg systolic  9. Diabetes mellitus. Hemoglobin A1c 10.1. NovoLog 7units 3 times daily, Levemir 22units nightly CBG (last 3)  Recent Labs    10/19/20 1625 10/19/20 2053 10/20/20 0638  GLUCAP 206* 162* 182*  Monitor on current doses  10. History of alcohol polysubstance use. Urine drug screen positive cocaine. Provide counseling 11. History of hepatitis C. Follow-up outpatient 12. L foot drop- suggest possible AFO, etc and PRAFO to reduce risk of L ankle contracture 13. L inattention- per primary team    LOS: 1 days A FACE TO [place] E Deoni Cosey 10/20/2020, 8:36 AM

## 2020-10-20 NOTE — Evaluation (Signed)
Occupational Therapy Assessment and Plan  Patient Details  Name: Lance [name] MRN: [id number] Date of Birth: 1946/04/09  OT Diagnosis: abnormal posture, cognitive deficits, hemiplegia affecting non-dominant side and muscle weakness (generalized) Rehab Potential: Rehab Potential (ACUTE ONLY): Good ELOS: 26-28 days   Today's Date: 10/20/2020 OT Individual Time: 6803-2122 OT Individual Time Calculation (min): 63 min     Hospital Problem: Active Problems:   Intraparenchymal hemorrhage of brain [facility])   Past Medical History:  Past Medical History:  Diagnosis Date  . Diabetes mellitus without complication ([place])   . Hepatitis C   . Schizophrenia [facility])    Past Surgical History:  Past Surgical History:  Procedure Laterality Date  . COLONOSCOPY WITH PROPOFOL N/A 08/15/2020   Procedure: COLONOSCOPY WITH PROPOFOL;  Surgeon: Lucilla Lame, MD;  Location: [facility] ENDOSCOPY;  Service: Endoscopy;  Laterality: N/A;  . ESOPHAGOGASTRODUODENOSCOPY (EGD) WITH PROPOFOL N/A 10/10/2016   Procedure: ESOPHAGOGASTRODUODENOSCOPY (EGD) WITH PROPOFOL;  Surgeon: Lucilla Lame, MD;  Location: [facility] ENDOSCOPY;  Service: Endoscopy;  Laterality: N/A;  . HERNIA REPAIR    . VISCERAL ARTERY INTERVENTION N/A 10/11/2016   Procedure: Visceral Artery Intervention;  Surgeon: Algernon Huxley, MD;  Location: [place] CV LAB;  Service: Cardiovascular;  Laterality: N/A;    Assessment & Plan Clinical Impression: Patient is a 75 y.o. year old male with recent admission to the hospital on Lance [name]. Lance [name] is a 75 year old right-handed male with history of diabetes mellitus, hepatitis C and schizophrenia as well as alcohol and polysubstance use. Per chart review patient lives alone. 1 level home 3 steps to entry. Used a Rollator for the past few weeks due to gait dysfunction. Presented 10/14/2020 with weakness x2 weeks especially left lower extremity with gait disorder. CT of the head showed a 3.4 x 1.7 x 2.8 acute right frontal  parietal parenchymal bleed. CT cervical spine negative. MRI follow-up showed stable intra-axial hemorrhage in the right superior frontal gyrus motor strip.  Patient transferred to CIR on 10/19/2020 .    Patient currently requires total with basic self-care skills secondary to muscle weakness and muscle paralysis, impaired timing and sequencing, unbalanced muscle activation, motor apraxia, decreased coordination and decreased motor planning, decreased midline orientation and decreased attention to left, decreased initiation, decreased attention, decreased awareness, decreased problem solving, decreased safety awareness, decreased memory and delayed processing and decreased sitting balance, decreased standing balance, decreased postural control, hemiplegia and decreased balance strategies.  Prior to hospitalization, patient could complete ADLs with modified independent .  Patient will benefit from skilled intervention to decrease level of assist with basic self-care skills and increase independence with basic self-care skills prior to discharge home with care partner.  Anticipate patient will require minimal physical assistance and moderate physical assestance and follow up home health.  OT - End of Session Activity Tolerance: Decreased this session Endurance Deficit: Yes Endurance Deficit Description: requires supported seated rest breaks OT Assessment Rehab Potential (ACUTE ONLY): Good OT Barriers to Discharge: Decreased caregiver support OT Barriers to Discharge Comments: Will need 24 hour physical assist OT Patient demonstrates impairments in the following area(s): Balance;Motor;Perception;Sensory;Endurance;Cognition;Pain;Safety OT Basic ADL's Functional Problem(s): Eating;Grooming;Bathing;Dressing;Toileting OT Transfers Functional Problem(s): Toilet;Tub/Shower OT Additional Impairment(s): Fuctional Use of Upper Extremity OT Plan OT Intensity: Minimum of 1-2 x/day, 45 to 90 minutes OT  Frequency: 5 out of 7 days OT Duration/Estimated Length of Stay: 26-28 days OT Treatment/Interventions: Balance/vestibular training;Discharge planning;Functional electrical stimulation;Pain management;Self Care/advanced ADL retraining;UE/LE Coordination activities;Therapeutic Exercise;Patient/family education;Functional mobility training;Cognitive remediation/compensation;Disease mangement/prevention;Visual/perceptual remediation/compensation;DME/adaptive equipment instruction;Neuromuscular re-education;Psychosocial support;UE/LE  Strength taining/ROM;Wheelchair propulsion/positioning;Community reintegration OT Self Feeding Anticipated Outcome(s): modified independent OT Basic Self-Care Anticipated Outcome(s): min assist OT Toileting Anticipated Outcome(s): min assist OT Bathroom Transfers Anticipated Outcome(s): min to mod assist OT Recommendation Recommendations for Other Services: Neuropsych consult Patient destination: Home Follow Up Recommendations: Home health OT;24 hour supervision/assistance Equipment Recommended: To be determined   OT Evaluation Precautions/Restrictions  Precautions Precautions: Fall;Other (comment) Precaution Comments: L hemiplegia Restrictions Weight Bearing Restrictions: No General   Vital Signs Therapy Vitals Temp: 98.1 F (36.7 C) Pulse Rate: 89 Resp: 20 BP: 132/85 Patient Position (if appropriate): Lying Oxygen Therapy SpO2: 100 % O2 Device: Room Air Pain   Home Living/Prior Functioning Home Living Living Arrangements: Alone Available Help at Discharge: Family,Available 24 hours/day,Other (Comment),Personal care attendant Type of Home: House Home Access: Stairs to enter [employer] of Steps: 4 Entrance Stairs-Rails: Right,Left,Can reach both Home Layout: One level Bathroom Shower/Tub: Optometrist: Yes  Lives With: Alone IADL History Homemaking Responsibilities: Yes Meal  Prep Responsibility: Primary Current License: Yes Education: 12th and trade school Occupation: Retired Prior Function Level of Independence: Requires assistive device for independence,Needs assistance with tranfers,Needs assistance with gait  Able to Take Stairs?: Yes Driving: No Vocation: Retired Surveyor, mining Baseline Vision/History: Wears glasses Wears Glasses: Reading only (per pt report) Patient Visual Report: No change from baseline Vision Assessment?: Yes Eye Alignment: Within Functional Limits Ocular Range of Motion: Within Functional Limits Tracking/Visual Pursuits: Able to track stimulus in all quads without difficulty Convergence: Within functional limits Perception  Perception: Impaired Inattention/Neglect: Does not attend to left side of body Spatial Orientation: impaired midline orientation Praxis Praxis: Impaired Praxis Impairment Details: Motor planning;Initiation Cognition Overall Cognitive Status: Impaired/Different from baseline Arousal/Alertness: Awake/alert Orientation Level: Person;Place;Situation Person: Oriented Place: Oriented Situation: Oriented Year: 2022 Month: May Day of Week: Correct Memory: Impaired Memory Impairment: Storage deficit;Decreased recall of new information Immediate Memory Recall: Sock;Blue;Bed Memory Recall Sock: Not able to recall Memory Recall Blue: Not able to recall Memory Recall Bed: Not able to recall Attention: Focused;Sustained Focused Attention: Appears intact Sustained Attention: Appears intact Sustained Attention Impairment: Verbal basic;Functional basic Selective Attention: Impaired Selective Attention Impairment: Verbal basic;Functional basic Awareness: Impaired Awareness Impairment: Emergent impairment Problem Solving: Impaired Problem Solving Impairment: Verbal basic Executive Function: Reasoning;Initiating Reasoning: Impaired Reasoning Impairment: Verbal basic Initiating: Impaired Initiating Impairment:  Verbal basic Behaviors: Perseveration Safety/Judgment: Impaired Sensation Sensation Light Touch: Impaired Detail Light Touch Impaired Details: Impaired LUE Proprioception: Impaired Detail Proprioception Impaired Details: Impaired LUE Stereognosis: Not tested Coordination Gross Motor Movements are Fluid and Coordinated: No Fine Motor Movements are Fluid and Coordinated: No Coordination and Movement Description: LUE and LLE coordination deficits with pt needing mod assist to incorporate the LUE into functional tasks such as applying deodorant or washing the right arm. Motor  Motor Motor: Abnormal tone;Abnormal postural alignment and control;Hemiplegia Motor - Skilled Clinical Observations: L hemiplegia, impaired trunk control  Trunk/Postural Assessment  Cervical Assessment Cervical Assessment: Exceptions to [facility] (forward head with limited cervical rotation to both sides as well as decreased extension.  Pt with increased pain in his neck reported throughout session.) Thoracic Assessment Thoracic Assessment: Exceptions to [facility] (thoracic kyphosis) Lumbar Assessment Lumbar Assessment:  (posterior pelvic tilt) Postural Control Postural Control: Deficits on evaluation Trunk Control: impaired with L lean/pushing tendencies as well as posterior lean/LOB Righting Reactions: impaired with poor motor planning and delayed Protective Responses: delayed and inadequate with poor motor planning  Balance Balance Balance Assessed: Yes Static Sitting Balance Static Sitting -  Balance Support: Feet supported Static Sitting - Level of Assistance: 1: +1 Total assist Dynamic Sitting Balance Dynamic Sitting - Balance Support: Feet supported Dynamic Sitting - Level of Assistance: 1: +1 Total assist Sitting balance - Comments: repeated L lean/LOB with possible pushing and impaired righting reactions Static Standing Balance Static Standing - Balance Support: During functional activity;Right upper extremity  supported Static Standing - Level of Assistance: 1: +2 Total assist Dynamic Standing Balance Dynamic Standing - Balance Support: During functional activity Dynamic Standing - Level of Assistance: 1: +2 Total assist Extremity/Trunk Assessment RUE Assessment RUE Assessment: Within Functional Limits Active Range of Motion (AROM) Comments: WFLs overall General Strength Comments: WFLs for selfcare tasks, not formally assessed. LUE Assessment LUE Assessment: Exceptions to [facility] Passive Range of Motion (PROM) Comments: WFLs for all joints Active Range of Motion (AROM) Comments: Brunnstrum stage IV in the arm with stage VI in the hand.  Isolated movements noted in the elbow, wrist, and digits, but limite AROM at the shoulder . General Strength Comments: Gross digit flexion 3/5 as well as elbow flexion/extension.  Shoulder flexion 2+/5  Care Tool Care Tool Self Care Eating        Oral Care         Bathing   Body parts bathed by patient: Chest;Abdomen;Left arm;Right upper leg;Left upper leg;Face Body parts bathed by helper: Front perineal area;Buttocks;Right arm;Right lower leg;Left lower leg   Assist Level: 2 Helpers    Upper Body Dressing(including orthotics)   What is the patient wearing?: Pull over shirt   Assist Level: Maximal Assistance - Patient 25 - 49%    Lower Body Dressing (excluding footwear)   What is the patient wearing?: Incontinence brief;Pants Assist for lower body dressing: 2 Helpers    Putting on/Taking off footwear   What is the patient wearing?: Non-skid slipper socks;Ted hose Assist for footwear: Dependent - Patient 0%       Care Tool Toileting Toileting activity   Assist for toileting: 2 Helpers     Care Tool Bed Mobility Roll left and right activity        Sit to lying activity   Sit to lying assist level: 2 Helpers    Lying to sitting edge of bed activity   Lying to sitting edge of bed assist level: 2 Helpers     Care Tool Transfers Sit to  stand transfer   Sit to stand assist level: 2 Helpers    Chair/bed transfer         Toilet transfer         Care Tool Cognition Expression of Ideas and Wants Expression of Ideas and Wants: Some difficulty - exhibits some difficulty with expressing needs and ideas (e.g, some words or finishing thoughts) or speech is not clear   Understanding Verbal and Non-Verbal Content Understanding Verbal and Non-Verbal Content: Usually understands - understands most conversations, but misses some part/intent of message. Requires cues at times to understand   Memory/Recall Ability *first 3 days only      Refer to Care Plan for Long Term Goals  SHORT TERM GOAL WEEK 1 OT Short Term Goal 1 (Week 1): Pt will complete UB bathing with min assist in support sitting for two consecutive sessions. OT Short Term Goal 2 (Week 1): Pt will maintain static sitting balance for 5 mins with no more than mod assist in preparation for selfcare tasks. OT Short Term Goal 3 (Week 1): Pt will donn pullover shirt with mod assist in supported sitting  following hemi techniques. OT Short Term Goal 4 (Week 1): Pt will complete toilet transfer to the drop arm commode with max assist squat pivot. OT Short Term Goal 5 (Week 1): Pt will use the LUE with bathing tasks with no more than min assist to wash the RUE.  Recommendations for other services: Neuropsych   Skilled Therapeutic Intervention ADL ADL Eating: Supervision/safety Where Assessed-Eating: Bed level Grooming: Minimal assistance Where Assessed-Grooming: Bed level Upper Body Bathing: Maximal assistance Where Assessed-Upper Body Bathing: Edge of bed Lower Body Bathing: Other (comment) (total +2) Where Assessed-Lower Body Bathing: Edge of bed Upper Body Dressing: Other (Comment) (total assist +2) Where Assessed-Upper Body Dressing: Edge of bed Lower Body Dressing: Other (Comment) (total +2) Where Assessed-Lower Body Dressing: Edge of bed Toileting: Other  (Comment) (total +2) Toilet Transfer: Other (comment) (total +2) Toilet Transfer Method: Squat pivot Toilet Transfer Equipment: Drop arm bedside commode Mobility  Bed Mobility Bed Mobility: Supine to Sit;Sit to Supine;Rolling Right;Rolling Left Rolling Right: Total Assistance - Patient < 25% Rolling Left: Maximal Assistance - Patient 25-49% Supine to Sit: Total Assistance - Patient < 25% Sit to Supine: Total Assistance - Patient < 25% Transfers Sit to Stand: 2 Helpers Stand to Sit: 2 Helpers  Session Note:  Pt completed supine to sit EOB with total assist to begin session.  Increased pushing to the left during transition from sidelying as well as once he was in sitting.  Total assist for static sitting balance secondary to pushing as well as posterior LOB.  He needed mod assist for integration of the LUE for holding washcloth and washing the RUE.  Total assist for dynamic sitting balance as while working on this as well as for UB dressing.  Completed sit to stand with total +2 (pt 20%) for washing front and back peri area.  Pt unable to achieve full upright standing.  Returned to supine for rolling to donn brief and pants.  Total assist for rolling to the right side of the bed with mod assist to the left.  Pt throughout session reporting increased posterior and left side neck pain, with limited cervical AROM noted.  Therapist applied sports cream to his neck as well as provided K pad for comfort.  He was left in bed with the call button and phone in reach.  Discussed anticipation for 3-4 week LOS and 24 hour physical assistance at discharge.    Discharge Criteria: Patient will be discharged from OT if patient refuses treatment 3 consecutive times without medical reason, if treatment goals not met, if there is a change in medical status, if patient makes no progress towards goals or if patient is discharged from hospital.  The above assessment, treatment plan, treatment alternatives and goals were  discussed and mutually agreed upon: by patient     Marguita Venning,Antionne OTR/L 10/20/2020, 5:21 PM

## 2020-10-20 NOTE — IPOC Note (Addendum)
Overall Plan of Care [facility]) Patient Details Name: Lance [name] MRN: [id number] DOB: March 08, 1946  Admitting Diagnosis: <principal problem not specified>  Hospital Problems: Active Problems:   Intraparenchymal hemorrhage of brain [facility])     Functional Problem List: Nursing Bladder,Bowel,Endurance,Medication Management,Pain,Nutrition,Safety  PT Balance,Perception,Behavior,Safety,Edema,Sensory,Endurance,Skin Integrity,Motor,Nutrition,Pain  OT Balance,Motor,Perception,Sensory,Endurance,Cognition,Pain,Safety  SLP Safety,Cognition,Linguistic  TR         Basic ADL's: OT Eating,Grooming,Bathing,Dressing,Toileting     Advanced  ADL's: OT       Transfers: PT Bed Mobility,Bed to Chair,Car,Furniture  OT Toilet,Tub/Shower     Locomotion: PT Ambulation,Wheelchair Mobility,Stairs     Additional Impairments: OT Fuctional Use of Upper Extremity  SLP Communication comprehension,expression    TR      Anticipated Outcomes Item Anticipated Outcome  Self Feeding modified independent  Swallowing  N/A   Basic self-care  min assist  Toileting  min assist   Bathroom Transfers min to mod assist  Bowel/Bladder  manage bowel and bladder with mod I assist  Transfers  mod assist  Locomotion  max assist  Communication  supervision basic, minA complex comprehension  Cognition  supervision basic, minA mild complex problem solving/safety/awareness  Pain  pain level less than 3 on scale of 0-10  Safety/Judgment  remain free of injury, prevent falls with cues and reminders   Therapy Plan: PT Intensity: Minimum of 1-2 x/day ,45 to 90 minutes PT Frequency: 5 out of 7 days PT Duration Estimated Length of Stay: 3.5-4 weeks OT Intensity: Minimum of 1-2 x/day, 45 to 90 minutes OT Frequency: 5 out of 7 days OT Duration/Estimated Length of Stay: 26-28 days SLP Intensity: Minumum of 1-2 x/day, 30 to 90 minutes SLP Frequency: 3 to 5 out of 7 days SLP Duration/Estimated Length of Stay:  3-4 weeks   Due to the current state of emergency, patients may not be receiving their 3-hours of Medicare-mandated therapy.   Team Interventions: Nursing Interventions Patient/Family Education,Pain Management,Bladder Management,Medication Management,Discharge Planning,Bowel Management,Psychosocial Support,Disease Management/Prevention  PT interventions Ambulation/gait training,Community Corporate treasurer re-education,Psychosocial support,Stair training,UE/LE Strength taining/ROM,Wheelchair propulsion/positioning,Balance/vestibular training,Discharge planning,Functional electrical stimulation,Pain management,Skin care/wound management,Therapeutic Activities,UE/LE Coordination activities,Disease management/prevention,Cognitive remediation/compensation,Functional mobility training,Patient/family education,Splinting/orthotics,Therapeutic Exercise,Visual/perceptual remediation/compensation  OT Interventions Balance/vestibular training,Discharge planning,Functional electrical stimulation,Pain management,Self Care/advanced ADL retraining,UE/LE Coordination activities,Therapeutic Exercise,Patient/family education,Functional mobility training,Cognitive remediation/compensation,Disease mangement/prevention,Visual/perceptual remediation/compensation,DME/adaptive equipment instruction,Neuromuscular re-education,Psychosocial support,UE/LE Strength taining/ROM,Wheelchair propulsion/positioning,Community reintegration  SLP Interventions Cognitive remediation/compensation,Medication managment,Cueing hierarchy,Patient/family education,Functional tasks,Speech/Language facilitation  TR Interventions    SW/CM Interventions Discharge Planning,Psychosocial Support,Patient/Family Education,Disease Management/Prevention   Barriers to Discharge MD  Medical stability  Nursing      PT Inaccessible home environment,Decreased caregiver support,Home environment access/layout,Lack  of/limited family support    OT Decreased caregiver support Will need 24 hour physical assist  SLP Decreased caregiver support Paitent will require 24 hour supervision upon discharge  [place] for SNF coverage,Medication compliance     Team Discharge Planning: Destination: PT-Home ,OT- Home , SLP-Home Projected Follow-up: PT-Home health PT,24 hour supervision/assistance, OT-  Home health OT,24 hour supervision/assistance, SLP-24 hour supervision/assistance,Home Health SLP Projected Equipment Needs: PT-To be determined, OT- To be determined, SLP-None recommended by SLP Equipment Details: PT- , OT-  Patient/family involved in discharge planning: PT- Patient,  OT-Patient, SLP-Patient  MD ELOS: 23-25d Medical Rehab Prognosis:  Good Assessment:  75 year old right-handed male with history of diabetes mellitus, hepatitis C and schizophrenia as well as alcohol and polysubstance use. Per chart review patient lives alone. 1 level home 3 steps to entry. Used a Rollator for the past few weeks due to gait dysfunction. Presented 10/14/2020 with  weakness x2 weeks especially left lower extremity with gait disorder. CT of the head showed a 3.4 x 1.7 x 2.8 acute right frontal parietal parenchymal bleed. CT cervical spine negative. MRI follow-up showed stable intra-axial hemorrhage in the right superior frontal gyrus motor strip. Stable right anterior frontal convexity en plaque meningioma since December with comparison of prior imaging. Small bilateral subdural hematomas resolved since earlier tracings of December. Prior MRIs were also suggestive of chronic microangiopathy and mild hemorrhages concerning for possible amyloid angiopathy. Echocardiogram with ejection fraction of 70 to 65% grade 1 diastolic dysfunction. Admission chemistries unremarkable except glucose 167 alcohol negative urine drug screen positive cocaine. Neurology follow-up conservative care close monitoring of blood pressure.  Tolerating a regular consistency diet. Due to patient's decreased functional ability was admitted for a comprehensive rehab program. .    See Team Conference Notes for weekly updates to the plan of care

## 2020-10-20 NOTE — Evaluation (Addendum)
Speech Language Pathology Assessment and Plan  Patient Details  Name: Lance [name] MRN: [id number] Date of Birth: 04-05-1946  SLP Diagnosis: Speech and Language deficits;Cognitive Impairments  Rehab Potential: Good ELOS: 3-4 weeks    Today's Date: 10/20/2020 SLP Individual Time: 1100-1155 SLP Individual Time Calculation (min): 55 min   Hospital Problem: Active Problems:   Intraparenchymal hemorrhage of brain [facility])  Past Medical History:  Past Medical History:  Diagnosis Date  . Diabetes mellitus without complication ([place])   . Hepatitis C   . Schizophrenia [facility])    Past Surgical History:  Past Surgical History:  Procedure Laterality Date  . COLONOSCOPY WITH PROPOFOL N/A 08/15/2020   Procedure: COLONOSCOPY WITH PROPOFOL;  Surgeon: Lucilla Lame, MD;  Location: [facility] ENDOSCOPY;  Service: Endoscopy;  Laterality: N/A;  . ESOPHAGOGASTRODUODENOSCOPY (EGD) WITH PROPOFOL N/A 10/10/2016   Procedure: ESOPHAGOGASTRODUODENOSCOPY (EGD) WITH PROPOFOL;  Surgeon: Lucilla Lame, MD;  Location: [facility] ENDOSCOPY;  Service: Endoscopy;  Laterality: N/A;  . HERNIA REPAIR    . VISCERAL ARTERY INTERVENTION N/A 10/11/2016   Procedure: Visceral Artery Intervention;  Surgeon: Algernon Huxley, MD;  Location: [facility] CV LAB;  Service: Cardiovascular;  Laterality: N/A;    Assessment / Plan / Recommendation  Patient is a 75 y.o. right-handed male with history of diabetes mellitus, hepatitis C and schizophrenia as well as alcohol and polysubstance use. Per chart review patient lives alone. 1 level home 3 steps to entry. Used a Rollator for the past few weeks due to gait dysfunction. Presented 10/14/2020 with weakness x2 weeks especially left lower extremity with gait disorder. CT of the head showed a 3.4 x 1.7 x 2.8 acute right frontal parietal parenchymal bleed. CT cervical spine negative. MRI follow-up showed stable intra-axial hemorrhage in the right superior frontal gyrus motor strip. Stable right anterior  frontal convexity en plaque meningioma since December with comparison of prior imaging. Small bilateral subdural hematomas resolved since earlier tracings of December. Prior MRIs were also suggestive of chronic microangiopathy and mild hemorrhages concerning for possible amyloid angiopathy. Echocardiogram with ejection fraction of 70 to 67% grade 1 diastolic dysfunction. Admission chemistries unremarkable except glucose 167 alcohol negative urine drug screen positive cocaine. Neurology follow-up conservative care close monitoring of blood pressure. Tolerating a regular consistency diet. Due to patient's decreased functional ability was admitted for a comprehensive rehab program. Patient transferred to CIR on 10/19/2020 .   Clinical Impression Patient presents with a moderate cognitive-linguistic impairment with main areas of dysfunction being: memory, reasoning/awareness, comprehension of mild complex level, expressive language involving naming/word finding, initiating. Patient exhibited slow cognitive processing during all language and cognitive assessment tasks and in addition, upon SLP entering room, he was very lethargic and c/o significant pain in neck. Initially, he appeared slightly irritable asking SLP , "Why are you asking me all these questions?" but when he was more awake and when SLP informed him of reasoning for questions, he was pleasant and cooperative. He was able to name common object pictures but for less common, he had significant difficulty. During divergent naming task, he was only able to name 2-3 items in each category given 60 seconds to complete. He did demonstrate some awareness, telling SLP that he is here "because of my brain" and that "I fell a few times at home." He was oriented to "hospital", self, Month, but not date. When asked day of week he replied, "Well yesterday was Thursday so," but he was unable to spontaneously determine that today is Friday. Patient currently  requires  modA at times mod-maxA for safety, basic problem solving, awareness and ability to effectively communicate and understand basic to mild complex level language. He will benefit from skilled SLP intervention to improve function and reduce burden of care when discharged home with supervision from family.  Skilled Therapeutic Interventions          Speech-language cognitive evaluation  SLP Assessment  Patient will need skilled [place] [facility] Services during CIR admission    Recommendations  Recommendations for Other Services: Neuropsych consult Patient destination: Home Follow up Recommendations: 24 hour supervision/assistance;Home Health SLP Equipment Recommended: None recommended by SLP    SLP Frequency 3 to 5 out of 7 days   SLP Duration  SLP Intensity  SLP Treatment/Interventions 3-4 weeks  Minumum of 1-2 x/day, 30 to 90 minutes  Cognitive remediation/compensation;Medication managment;Cueing hierarchy;Patient/family education;Functional tasks;Speech/Language facilitation    Pain Pain Assessment Pain Scale: Faces Faces Pain Scale: Hurts little more Pain Type: Acute pain Pain Location: Neck Pain Orientation: Posterior Pain Descriptors / Indicators: Aching Pain Onset: On-going Pain Intervention(s): Other (Comment) (RN has already addressed with pain meds and muscle rub)  Prior Functioning Cognitive/Linguistic Baseline: Information not available (suspect some baseline deficits as patient reports family and hired Teaching laboratory technician help "a lot") Type of Home: House  Lives With: Alone Available Help at Discharge: Family;Available 24 hours/day;Other (Comment) (patient reports having daily hired help) Education: 12th and trade school Vocation: Retired  Programmer, systems Overall Cognitive Status: Impaired/Different from baseline Arousal/Alertness: Awake/alert Orientation Level: Oriented to person;Oriented to place;Disoriented to time Attention:  Selective;Sustained Sustained Attention: Impaired Sustained Attention Impairment: Verbal basic;Functional basic Selective Attention: Impaired Selective Attention Impairment: Verbal basic;Functional basic Memory: Impaired Memory Impairment: Storage deficit;Retrieval deficit;Decreased recall of new information Awareness: Impaired Awareness Impairment: Emergent impairment Problem Solving: Impaired Problem Solving Impairment: Verbal basic Executive Function: Reasoning;Initiating Reasoning: Impaired Reasoning Impairment: Verbal basic Initiating: Impaired Initiating Impairment: Verbal basic Safety/Judgment: Impaired  Comprehension Auditory Comprehension Overall Auditory Comprehension: Impaired Yes/No Questions: Within Functional Limits Commands: Within Functional Limits Conversation: Simple Interfering Components: Processing speed;Working Field seismologist: Conservation officer, nature: Not tested Reading Comprehension Reading Status: Not tested Expression Expression Primary Mode of Expression: Verbal Verbal Expression Overall Verbal Expression: Impaired Initiation: Impaired Level of Generative/Spontaneous Verbalization: Sentence;Conversation Repetition: No impairment Naming: Impairment Responsive: Not tested Confrontation: Impaired Convergent: Not tested Divergent: 25-49% accurate Pragmatics: No impairment Effective Techniques: Open ended questions;Semantic cues Non-Verbal Means of Communication: Not applicable Written Expression Dominant Hand: Right Written Expression: Not tested Oral Motor Oral Motor/Sensory Function Overall Oral Motor/Sensory Function: Within functional limits Motor Speech Overall Motor Speech: Appears within functional limits for tasks assessed Articulation: Within functional limitis Intelligibility: Intelligible Motor Planning: Witnin functional limits  Care Tool Care Tool  Cognition Expression of Ideas and Wants Expression of Ideas and Wants: Some difficulty - exhibits some difficulty with expressing needs and ideas (e.g, some words or finishing thoughts) or speech is not clear   Understanding Verbal and Non-Verbal Content Understanding Verbal and Non-Verbal Content: Sometimes understands - understands only basic conversations or simple, direct phrases. Frequently requires cues to understand   Memory/Recall Ability *first 3 days only Memory/Recall Ability *first 3 days only: Current season;That he or she is in a hospital/hospital unit       Short Term Goals: Week 1: SLP Short Term Goal 1 (Week 1): Patient will name at least 10 items in a category within 90 seconds with minA. SLP Short Term Goal 2 (Week 1): Patient will orient to day of week, date,  year using visual aides (calendar, etc) with modA. SLP Short Term Goal 3 (Week 1): Patient will complete basic level medication label reading interpreting/comprehension and pill box simulation with modA. SLP Short Term Goal 4 (Week 1): Patient will complete functional tasks and simulated tasks ADL's with modA cues for maintaining adequate selective attention. SLP Short Term Goal 5 (Week 1): Patient will answer basic level comprehension and recall questions based on 2-3 sentence text ready aloud by SLP, with 75% accuracy and minA.  SLP Short Term Goal 6 (Week 1): Patient will verbally summarize after SLP read aloud of 3-4 sentence text, with 75% accuracy and modA.  Refer to Care Plan for Long Term Goals  Recommendations for other services: Neuropsych  Discharge Criteria: Patient will be discharged from SLP if patient refuses treatment 3 consecutive times without medical reason, if treatment goals not met, if there is a change in medical status, if patient makes no progress towards goals or if patient is discharged from hospital.  The above assessment, treatment plan, treatment alternatives and goals were discussed and  mutually agreed upon: by patient  Sonia Baller, MA, CCC-SLP Speech Therapy

## 2020-10-20 NOTE — Progress Notes (Signed)
Inpatient Rehabilitation  Patient information reviewed and entered into eRehab system by Pearlee Arvizu M. Ravleen Ries, M.A., CCC/SLP, PPS Coordinator.  Information including medical coding, functional ability and quality indicators will be reviewed and updated through discharge.    

## 2020-10-21 LAB — GLUCOSE, CAPILLARY
Glucose-Capillary: 178 mg/dL — ABNORMAL HIGH (ref 70–99)
Glucose-Capillary: 159 mg/dL — ABNORMAL HIGH (ref 70–99)
Glucose-Capillary: 271 mg/dL — ABNORMAL HIGH (ref 70–99)

## 2020-10-21 NOTE — Progress Notes (Signed)
Physical Therapy Session Note  Patient Details  Name: Lance [name] MRN: [id number] Date of Birth: 22-May-1946  Today's Date: 10/21/2020 PT Individual Time: 6144-3154 PT Individual Time Calculation (min): 70 min   Short Term Goals: Week 1:  PT Short Term Goal 1 (Week 1): Pt will perform rolling R/L in bed with max assist of 1 PT Short Term Goal 2 (Week 1): Pt will perform supine>sitting EOB with max assist of 2 PT Short Term Goal 3 (Week 1): Pt will perform sit>supine with mod assist of 2 PT Short Term Goal 4 (Week 1): Pt will perform bed<>chair transfer with +2 max assist using equipment as needed  Skilled Therapeutic Interventions/Progress Updates:    Pt received supine in bed with RN and NT present changing bed linen and providing patient with morning medications. Pt agreeable to therapy session. Pt continues to report L posterior cervical pain rated as 10/10 - RN administered medications at beginning of session with minimal relief - despite this pt eager to continue with therapy session stating his goal is to get OOB into the wheelchair. Donned pants, B LE TED hose and socks max assist - attempted hooklying bridge with facilitation for L LE management but pt unable to clear hips sufficiently to pull pants up over hips. Rolling R with total assist and L with mod assist, total assist to pull up pants - pt continues to demo very delayed initiation of movement with impaired motor planning and overall slow/stiff movements. Supine>sitting L EOB 3x during session with +2 total assist progressing towards +2 max assist - max sequential cuing for logroll technique to increase pt participation and independence - requires assist for B LE management and trunk upright. Had to return to supine 3x during session for rest breaks and for pain management. Sitting EOB, participated in the FIST with pt continuing to demonstrate strong L lean with pushing as well as impaired midline orientation and intermittent  posterior LOB - requires close supervision to max/total assist to maintain static sitting balance and prevent LOB with delayed or no righting reactions noted. Sit>stand EOB>3 Musketeer support with +2 max assist to come to stand - demos flexed posturing with repeated cuing for trunk/hip extension. Sit>stand in stedy with +2 max assist to come to stand and then once in stedy pt demonstrates even more intense pushing with R UE and R LE causing full L lateral trunk lean that despite attempts, pt was unable to correct and became unsafe therefore with +2 total assist returned to sitting EOB. L lateral scoot transfer EOB>TIS w/c using transfer board with max assist of 1 for trunk control during total assist board placement and then +2 total assist for scooting hips. Pt demos some pushing in wheelchair until supported on backrest and tilted backwards. Therapist adjusted head rest and foot rests for improved midline orientation and improved neck support. At end of session pt left sitting tilted back in TIS wheelchair with needs in reach and seat belt alarm on - nursing staff aware of pt's position.  Function In Sitting Test (FIST)  (1/2 femur on surface; hips/knees flexed to 90deg)   - indicate bed or mat table / step stool if used  SCORING KEY: 4 = Independent (completes task independently & successfully) 3 = Verbal Cues/Increased Time (completes task independently & successfully and only needs more time/cues) 2 = Upper Extremity Support (must use UE for support or assistance to complete successfully) 1 = Needs Assistance (unable to complete w/o physical assist; DOCUMENT LEVEL: min,  mod, max) 0 = Dependent (requires complete physical assist; unable to complete successfully even w/ physical assist)  Randomly Administer Once Throughout Exam  0 - Anterior Nudge (superior sternum)  0 - Posterior Nudge (between scapular spines)  0 - Lateral Nudge (to dominant side at acromion)     2 (inconsistently able to do  this) - Static sitting (30 seconds)  1 (max) - Sitting, shake 'no' (left and right)  2 (inconsistently) - Sitting, eyes closed (30 seconds)   0 - Sitting, lift foot (dominant side, lift foot 1 inch twice)    1 (max) - Pick up object from behind (object at midline, hands breadth posterior)  1 (max) - Forward reach (use dominant arm, must complete full motion) 0 (unable to clear hip) - Lateral reach (use dominant arm, clear opposite ischial tuberosity) 1 (max) - Pick up object from floor (from between feet)   0 (+2 assist) - Posterior scooting (move backwards 2 inches)  0 (+2 assist) - Anterior scooting (move forward 2 inches)  0 (+2 assist) - Lateral scooting (move to dominant side 2 inches)    TOTAL = 8/56  MCD > 5 points MCID for IP REHAB > 6 points      Therapy Documentation Precautions:  Precautions Precautions: Fall,Other (comment) Precaution Comments: L hemiplegia Restrictions Weight Bearing Restrictions: No  Pain:   Reports 10/10 L posterior neck pain - RN premedicated - provided distraction, emotional support, repositioning, and rest breaks for pain management during session.   Therapy/Group: Individual Therapy  Tawana Scale , PT, DPT, CSRS  10/21/2020, 7:48 AM

## 2020-10-21 NOTE — Progress Notes (Signed)
Occupational Therapy Session Note  Patient Details  Name: ALLYN BERTONI MRN: [id number] Date of Birth: Aug 25, 1945  Today's Date: 10/21/2020 OT Individual Time: 1125-1210 OT Individual Time Calculation (min): 45 min   Session 2: OT Individual Time: 1400-1450 OT Individual Time Calculation (min): 50 min  10 min missed d/t fatigue    Short Term Goals: Week 1:  OT Short Term Goal 1 (Week 1): Pt will complete UB bathing with min assist in support sitting for two consecutive sessions. OT Short Term Goal 2 (Week 1): Pt will maintain static sitting balance for 5 mins with no more than mod assist in preparation for selfcare tasks. OT Short Term Goal 3 (Week 1): Pt will donn pullover shirt with mod assist in supported sitting following hemi techniques. OT Short Term Goal 4 (Week 1): Pt will complete toilet transfer to the drop arm commode with max assist squat pivot. OT Short Term Goal 5 (Week 1): Pt will use the LUE with bathing tasks with no more than min assist to wash the RUE.  Skilled Therapeutic Interventions/Progress Updates:    Pt received sitting in TIS w/c with c/o pain in his back, requesting to rub back with washcloth. He was taken to the sink and as soon as chair was tilted forward pt began pushing strongly toward the L. He required heavy facilitation throughout session to maintain sitting balance in chair. Pt completed grooming tasks at the sink with min A, good spontaneous bimanual manipulation of self care items. Despite the LUE being placed in weightbearing pt continued to push actively toward the L with and without RUE support. Pt completed UB dressing with max A. Min A slideboard transfer (pushing assisted greatly), poorly controlled force production and immediate transfer to supine with +2 required for safety. Pt completed bridging supine for brief change with max A. Pt left supine with all needs met, bed alarm set.   Session 2: Pt received supine with no c/o pain. Pt completed  bed mobility R with mod A and L with min A for max A brief change (incontinent of urine). Sidelying to sitting EOB with heavy max A+2. Pt cued to place R UE on end bed rail and he was then able to maintain sitting balance with only CGA for several minutes. Once he removed the RUE he required mod A to return to bedrail and to maintain midline. Pt completed slideboard transfer to the R with max +2 assist. Pt was taken to the therapy gym. Pt completed squat pivot transfer to the mat toward the R with max +2 assist. Pt completed sitting balance activity with focus on the R UE reaching to the R to reduce L pushing/lean. Pt required intermittent mod A once he would come back to midline but overall only required min A with great reduction in L pushing. Interestingly, pt's pushing seems to be worse in supported sitting. Pt returned to the w/c with a max +2 squat pivot. He was taken back to his room and left sitting up with the chair alarm belt on and TIS seatbelt on for safety. All needs within reach. 10 min missed d/t pt fatigue.    Therapy Documentation Precautions:  Precautions Precautions: Fall,Other (comment) Precaution Comments: L hemiplegia Restrictions Weight Bearing Restrictions: No  Therapy/Group: Individual Therapy  Curtis Sites 10/21/2020, 6:41 AM

## 2020-10-22 LAB — GLUCOSE, CAPILLARY
Glucose-Capillary: 161 mg/dL — ABNORMAL HIGH (ref 70–99)
Glucose-Capillary: 337 mg/dL — ABNORMAL HIGH (ref 70–99)
Glucose-Capillary: 262 mg/dL — ABNORMAL HIGH (ref 70–99)

## 2020-10-22 MED ORDER — HYDROCORTISONE 1 % EX OINT
TOPICAL_OINTMENT | Freq: Two times a day (BID) | CUTANEOUS | Status: DC
  Administered 2020-10-23 – 2020-11-15 (×7): 1 via TOPICAL
  Filled 2020-10-22 (×2): qty 28

## 2020-10-22 MED ORDER — INSULIN ASPART 100 UNIT/ML IJ SOLN
8.0000 [IU] | Freq: Three times a day (TID) | INTRAMUSCULAR | Status: DC
  Administered 2020-10-22 – 2020-10-23 (×2): 8 [IU] via SUBCUTANEOUS

## 2020-10-22 NOTE — Progress Notes (Signed)
PROGRESS NOTE   Subjective/Complaints:  No issues overnite, neck stiffness discussed CT Cspine results of diffuse degenerative changes in C spine, gets some releif with sportscream Reviewed labs  ROS- limited due to cognition   Objective:   No results found. Recent Labs    10/20/20 0532  WBC 9.4  HGB 13.7  HCT 41.2  PLT 226   Recent Labs    10/20/20 0532  NA 136  K 4.3  CL 101  CO2 28  GLUCOSE 191*  BUN 18  CREATININE 0.94  CALCIUM 9.1    Intake/Output Summary (Last 24 hours) at 10/22/2020 1043 Last data filed at 10/22/2020 0734 Gross per 24 hour  Intake 600 ml  Output 1 ml  Net 599 ml        Physical Exam: Vital Signs Blood pressure (!) 114/99, pulse 67, temperature 97.7 F (36.5 C), temperature source Oral, resp. rate 19, weight 77.9 kg, SpO2 99 %.   General: No acute distress Mood and affect are appropriate Heart: Regular rate and rhythm no rubs murmurs or extra sounds Lungs: Clear to auscultation, breathing unlabored, no rales or wheezes Abdomen: Positive bowel sounds, soft nontender to palpation, nondistended Extremities: No clubbing, cyanosis, or edema Skin: No evidence of breakdown, no evidence of rash   Neurologic: Cranial nerves II through XII intact, motor strength is 5/5 in right  deltoid, bicep, tricep, grip, hip flexor, knee extensors, ankle dorsiflexor and plantar flexor 3- L finger flexors and trace delt, no active movemnt LLE,  + LLE flexor withdrawl Sensory exam normal sensation to light touch and proprioception in bilateral upper and lower extremities Cerebellar exam normal finger to nose to finger as well as heel to shin in bilateral upper and lower extremities Musculoskeletal: Full range of motion in all 4 extremities. No joint swelling    Assessment/Plan: 1. Functional deficits which require 3+ hours per day of interdisciplinary therapy in a comprehensive inpatient rehab  setting.  Physiatrist is providing close team supervision and 24 hour management of active medical problems listed below.  Physiatrist and rehab team continue to assess barriers to discharge/monitor patient progress toward functional and medical goals  Care Tool:  Bathing    Body parts bathed by patient: Chest,Abdomen,Left arm,Right upper leg,Left upper leg,Face   Body parts bathed by helper: Front perineal area,Buttocks,Right arm,Right lower leg,Left lower leg     Bathing assist Assist Level: 2 Helpers     Upper Body Dressing/Undressing Upper body dressing   What is the patient wearing?: Pull over shirt    Upper body assist Assist Level: Maximal Assistance - Patient 25 - 49%    Lower Body Dressing/Undressing Lower body dressing      What is the patient wearing?: Incontinence brief,Pants     Lower body assist Assist for lower body dressing: 2 Helpers     Toileting Toileting    Toileting assist Assist for toileting: 2 Helpers     Transfers Chair/bed transfer  Transfers assist  Chair/bed transfer activity did not occur: Safety/medical concerns  Chair/bed transfer assist level: 2 Helpers (+2 total assist slideboard)     Locomotion Ambulation   Ambulation assist   Ambulation activity did not occur:  Safety/medical concerns          Walk 10 feet activity   Assist  Walk 10 feet activity did not occur: Safety/medical concerns        Walk 50 feet activity   Assist Walk 50 feet with 2 turns activity did not occur: Safety/medical concerns         Walk 150 feet activity   Assist Walk 150 feet activity did not occur: Safety/medical concerns         Walk 10 feet on uneven surface  activity   Assist Walk 10 feet on uneven surfaces activity did not occur: Safety/medical concerns         Wheelchair     Assist Will patient use wheelchair at discharge?: Yes (TBD but likely)             Wheelchair 50 feet with 2 turns  activity    Assist            Wheelchair 150 feet activity     Assist          Blood pressure (!) 114/99, pulse 67, temperature 97.7 F (36.5 C), temperature source Oral, resp. rate 19, weight 77.9 kg, SpO2 99 %.  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 Antithrombotics: -DVT/anticoagulation:SCDs -antiplatelet therapy: N/A 3. Pain Management:Tylenol as needed -pt reports tylenol doesn't work - suggest Tramadol? 4. Mood/schizophrenia:Provide emotional support -antipsychotic agents: N/A 5. Neuropsych: This patientiscapable of making decisions on hisown behalf. 6. Skin/Wound Care:Routine skin checks 7. Fluids/Electrolytes/Nutrition:Routine in and outs with follow-up chemistries 8. Hypertension. Norvasc 10mg  daily Vitals:   10/21/20 1936 10/22/20 0414  BP: (!) 136/108 (!) 114/99  Pulse: 84 67  Resp: 16 19  Temp: 98 F (36.7 C) 97.7 F (36.5 C)  SpO2: 100% 99%  GOal <229 mmHg systolic , started 10mg  of amlodipine on 5/13, no dosage change for now , systolic controlled but has some diastolic elevation  9. Diabetes mellitus. Hemoglobin A1c 10.1. NovoLog 7units 3 times daily, Levemir 22units nightly CBG (last 3)  Recent Labs    10/21/20 1200 10/21/20 2100 10/22/20 0612  GLUCAP 159* 271* 161*  Monitor on current doses, pm elevation , increase novalog at mealtimes   10. History of alcohol polysubstance use. Urine drug screen positive cocaine. Provide counseling 11. History of hepatitis C. Follow-up outpatient 12. L foot drop- suggest possible AFO, etc and PRAFO to reduce risk of L ankle contracture 13. L inattention-  per primary team  14.  ? Hx of schizophrenia no antipsychotic meds, no auditory hallucinations, no agitation , cont to monitor for symptoms   LOS: 3 days A FACE TO Sublimity E Adream Parzych 10/22/2020, 10:43 AM

## 2020-10-23 LAB — GLUCOSE, CAPILLARY
Glucose-Capillary: 229 mg/dL — ABNORMAL HIGH (ref 70–99)
Glucose-Capillary: 97 mg/dL (ref 70–99)
Glucose-Capillary: 256 mg/dL — ABNORMAL HIGH (ref 70–99)
Glucose-Capillary: 121 mg/dL — ABNORMAL HIGH (ref 70–99)
Glucose-Capillary: 183 mg/dL — ABNORMAL HIGH (ref 70–99)
Glucose-Capillary: 227 mg/dL — ABNORMAL HIGH (ref 70–99)
Glucose-Capillary: 112 mg/dL — ABNORMAL HIGH (ref 70–99)

## 2020-10-23 MED ORDER — GABAPENTIN 100 MG PO CAPS
100.0000 mg | ORAL_CAPSULE | Freq: Three times a day (TID) | ORAL | Status: DC
  Administered 2020-10-23 – 2020-11-17 (×75): 100 mg via ORAL
  Filled 2020-10-23 (×75): qty 1

## 2020-10-23 MED ORDER — LIDOCAINE 5 % EX PTCH
1.0000 | MEDICATED_PATCH | CUTANEOUS | Status: DC
  Administered 2020-10-23 – 2020-11-16 (×25): 1 via TRANSDERMAL
  Filled 2020-10-23 (×25): qty 1

## 2020-10-23 MED ORDER — INSULIN ASPART 100 UNIT/ML IJ SOLN
9.0000 [IU] | Freq: Three times a day (TID) | INTRAMUSCULAR | Status: DC
  Administered 2020-10-23 – 2020-10-25 (×5): 9 [IU] via SUBCUTANEOUS

## 2020-10-23 MED ORDER — TOPIRAMATE 25 MG PO TABS
25.0000 mg | ORAL_TABLET | Freq: Every day | ORAL | Status: DC
  Administered 2020-10-23 – 2020-10-24 (×2): 25 mg via ORAL
  Filled 2020-10-23 (×3): qty 1

## 2020-10-23 MED ORDER — TRAZODONE HCL 50 MG PO TABS
50.0000 mg | ORAL_TABLET | Freq: Every evening | ORAL | Status: DC | PRN
  Administered 2020-10-23 – 2020-11-15 (×13): 50 mg via ORAL
  Filled 2020-10-23 (×14): qty 1

## 2020-10-23 NOTE — Progress Notes (Signed)
PROGRESS NOTE   Subjective/Complaints: C/o severe pain in coccyx. Examined and no evidence of pressure injury. Heating pad placed under coccyx and lidocaine patch ordered CBGs uncontrolled to 337 C/o headache at night that makes it hard for him to sleep  ROS- +coccyx pain  Objective:   No results found. No results for input(s): WBC, HGB, HCT, PLT in the last 72 hours. No results for input(s): NA, K, CL, CO2, GLUCOSE, BUN, CREATININE, CALCIUM in the last 72 hours.  Intake/Output Summary (Last 24 hours) at 10/23/2020 1241 Last data filed at 10/23/2020 1231 Gross per 24 hour  Intake 800 ml  Output --  Net 800 ml        Physical Exam: Vital Signs Blood pressure (!) 145/89, pulse 73, temperature 97.9 F (36.6 C), temperature source Oral, resp. rate 17, weight 77.9 kg, SpO2 100 %. Gen: no distress, normal appearing HEENT: oral mucosa pink and moist, NCAT Cardio: Reg rate Chest: normal effort, normal rate of breathing Abd: soft, non-distended Ext: no edema Psych: pleasant, normal affect Skin: intact Neurologic: Cranial nerves II through XII intact, motor strength is 5/5 in right  deltoid, bicep, tricep, grip, hip flexor, knee extensors, ankle dorsiflexor and plantar flexor 3- L finger flexors and trace delt, no active movemnt LLE,  + LLE flexor withdrawl Sensory exam normal sensation to light touch and proprioception in bilateral upper and lower extremities Cerebellar exam normal finger to nose to finger as well as heel to shin in bilateral upper and lower extremities Musculoskeletal: Full range of motion in all 4 extremities. No joint swelling    Assessment/Plan: 1. Functional deficits which require 3+ hours per day of interdisciplinary therapy in a comprehensive inpatient rehab setting.  Physiatrist is providing close team supervision and 24 hour management of active medical problems listed below.  Physiatrist and  rehab team continue to assess barriers to discharge/monitor patient progress toward functional and medical goals  Care Tool:  Bathing    Body parts bathed by patient: Chest,Abdomen,Left arm,Right upper leg,Left upper leg,Face   Body parts bathed by helper: Front perineal area,Buttocks,Right arm,Right lower leg,Left lower leg Body parts n/a: Right arm,Front perineal area,Buttocks,Right lower leg,Left lower leg   Bathing assist Assist Level: 2 Helpers     Upper Body Dressing/Undressing Upper body dressing   What is the patient wearing?: Pull over shirt    Upper body assist Assist Level: Maximal Assistance - Patient 25 - 49%    Lower Body Dressing/Undressing Lower body dressing      What is the patient wearing?: Incontinence brief,Pants     Lower body assist Assist for lower body dressing: 2 Helpers     Toileting Toileting    Toileting assist Assist for toileting: Dependent - Patient 0%     Transfers Chair/bed transfer  Transfers assist  Chair/bed transfer activity did not occur: Safety/medical concerns  Chair/bed transfer assist level: 2 Helpers     Locomotion Ambulation   Ambulation assist   Ambulation activity did not occur: Safety/medical concerns          Walk 10 feet activity   Assist  Walk 10 feet activity did not occur: Safety/medical concerns  Walk 50 feet activity   Assist Walk 50 feet with 2 turns activity did not occur: Safety/medical concerns         Walk 150 feet activity   Assist Walk 150 feet activity did not occur: Safety/medical concerns         Walk 10 feet on uneven surface  activity   Assist Walk 10 feet on uneven surfaces activity did not occur: Safety/medical concerns         Wheelchair     Assist Will patient use wheelchair at discharge?: Yes (TBD but likely)             Wheelchair 50 feet with 2 turns activity    Assist            Wheelchair 150 feet activity      Assist          Blood pressure (!) 145/89, pulse 73, temperature 97.9 F (36.6 C), temperature source Oral, resp. rate 17, weight 77.9 kg, SpO2 100 %.  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 Antithrombotics: -DVT/anticoagulation:SCDs -antiplatelet therapy: N/A 3. Pain management:Tylenol as needed  5/16: topamax 25mg  started at night for headache/insomnia. Lidocaine patch added for coccyx pain 4. Mood/schizophrenia:Provide emotional support -antipsychotic agents: N/A 5. Neuropsych: This patientiscapable of making decisions on hisown behalf. 6. Skin/Wound Care:Routine skin checks 7. Fluids/Electrolytes/Nutrition:Routine in and outs with follow-up chemistries 8. Hypertension. Continue Norvasc 10mg  daily Vitals:   10/22/20 1915 10/23/20 0449  BP: 133/84 (!) 145/89  Pulse: 78 73  Resp: (!) 21 17  Temp: 97.9 F (36.6 C) 97.9 F (36.6 C)  SpO2: 99% 100%  GOal <740 mmHg systolic , started 10mg  of amlodipine on 5/13, no dosage change for now , systolic controlled but has some diastolic elevation  9. Diabetes mellitus. Hemoglobin A1c 10.1. NovoLog increased to 9units 3 times daily, Levemir 22units nightly CBG (last 3)  Recent Labs    10/22/20 2107 10/23/20 0609 10/23/20 1138  GLUCAP 262* 121* 183*  Monitor on current doses, pm elevation , increase novalog at mealtimes   10. History of alcohol polysubstance use. Urine drug screen positive cocaine. Provide counseling 11. History of hepatitis C. Follow-up outpatient 12. L foot drop- suggest possible AFO, etc and PRAFO to reduce risk of L ankle contracture 13. L  inattention- per primary team  14.  ? Hx of schizophrenia no antipsychotic meds, no auditory hallucinations, no agitation , cont to monitor for symptoms   LOS: 4 days A FACE TO FACE EVALUATION WAS PERFORMED  Clide Deutscher Angelyna Henderson 10/23/2020, 12:41 PM

## 2020-10-23 NOTE — Progress Notes (Signed)
Patient ID: Lance [name], male   DOB: [birth year], 75 y.o.   MRN: [id number] Follow up with the patient regarding educational needs. Reinforced secondary stroke risk management including HTN, HLD (Trig level 369 and LDL 59) with DM (A1C 10.1). Patient noted was familiar with insulin administration and probably did not do as well with carb counting as he should. Reviewed dietary modifications and foods to avoid on CMM diet. Patient given handouts on CMM diet, carb counting, DM management and low salt recommendations for high triglyceride eating plan. Patient reported HA uncontrolled with tylenol and inability to sleep at HS. Reviewed concerns with nurse

## 2020-10-23 NOTE — Progress Notes (Signed)
Occupational Therapy Session Note  Patient Details  Name: Lance [name] MRN: [id number] Date of Birth: Jul 06, 1945  Today's Date: 10/23/2020 OT Individual Time: 0802-0905 OT Individual Time Calculation (min): 63 min    Short Term Goals: Week 1:  OT Short Term Goal 1 (Week 1): Pt will complete UB bathing with min assist in support sitting for two consecutive sessions. OT Short Term Goal 2 (Week 1): Pt will maintain static sitting balance for 5 mins with no more than mod assist in preparation for selfcare tasks. OT Short Term Goal 3 (Week 1): Pt will donn pullover shirt with mod assist in supported sitting following hemi techniques. OT Short Term Goal 4 (Week 1): Pt will complete toilet transfer to the drop arm commode with max assist squat pivot. OT Short Term Goal 5 (Week 1): Pt will use the LUE with bathing tasks with no more than min assist to wash the RUE.  Skilled Therapeutic Interventions/Progress Updates:    Pt in bed to start session laying on his left side.  Noted bladder incontinence in his brief as well as slight stool incontinence.  Had him complete rolling side to side in the bed to cleaning of buttocks and peri area as well as donning new brief.  He was able to roll to the right with total assist and to the left with min assist.  Increased time needed for rolling both directions secondary to neck stiffness.  He does not turn his head to the side he is rolling to.  Total assist for washing and drying his buttocks with setup assist for washing his front peri area and drying it.  Total assist for donning the brief before transferring from left sidelying to sitting at the same total assist level.  Once up, he demonstrates increased pushing to the left and needs cueing to reach for the foot rail.  He was able to maintain static sitting balance for less than 3-4 seconds with min guard assist.  He was able to complete squat pivot transfer to the right with total assist to the wheelchair to  continue working on dressing and bathing.  He was able to wash his face with setup and UB with min assist in supported sitting.  Max demonstrational cueing to maintain midline orientation using the mirror for feedback secondary to pushing to the left.  He was only able to maintain visual attention to the mirror for a couple of seconds before being internally distracted and looking down or reporting increased left side neck pain.  Total assist for washing lower legs as therapist had pt focus on keeping himself at midline in the tilt in space wheelchair.  Total assist for donning pants sit to stand as well as max assist for donning pullover shirt.  Finished with pt in the wheelchair with lap tray in place and safety belt in place as well.  Call button and phone in reach.    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 Restrictions Weight Bearing Restrictions: No  Pain: Pain Assessment Pain Scale: Faces Faces Pain Scale: Hurts little more Pain Type: Acute pain Pain Location: Neck Pain Orientation: Left Pain Descriptors / Indicators: Aching;Discomfort Pain Onset: With Activity Pain Intervention(s): Repositioned ADL: See Care Tool Section for some details of mobility and selfcare  Therapy/Group: Individual Therapy  Renuka Farfan,Joseff OTR/L 10/23/2020, 9:57 AM

## 2020-10-23 NOTE — Progress Notes (Signed)
Physical Therapy Session Note  Patient Details  Name: Lance [name] MRN: [id number] Date of Birth: 22-Jan-1946  Today's Date: 10/23/2020 PT Individual Time: 0912-1015; 9381-0175 PT Individual Time Calculation (min): 63 min and 25 mins  Short Term Goals: Week 1:  PT Short Term Goal 1 (Week 1): Pt will perform rolling R/L in bed with max assist of 1 PT Short Term Goal 2 (Week 1): Pt will perform supine>sitting EOB with max assist of 2 PT Short Term Goal 3 (Week 1): Pt will perform sit>supine with mod assist of 2 PT Short Term Goal 4 (Week 1): Pt will perform bed<>chair transfer with +2 max assist using equipment as needed  Skilled Therapeutic Interventions/Progress Updates:    Session 1: Patient received sitting up in wc, agreeable to PT. He reports pain in posterior neck, back and bottom. RN present to provide pain rx and AM rx. PT providing rest breaks, distractions and repositioning to assist with pain management. PT transporting patient in wc to therapy gym for time management and energy conservation. He transferred to therapy mat via slideboard with MaxA x2. Static sitting balance practice completed EOM with mirror for visual feedback. Vertical line drawn on mirror to further assist patient with midline orientation. Patient with perpetual L lateral lean despite patients R UE being withheld from pushing off of mat/his leg. Patient responsive to cues to have R shoulder touch PTs shoulder on his R side, but was unable to maintain this posture for longer than ~5s. Patient with limited use of mirror for visual feedback, reporting pain in his neck when looking neutral/up at mirror. Patient engaged in dynamic sitting balance task reaching to the R to retrieve horseshoes. First with R UE and then with L UE to encourage R lateral weight shift and then return to neutral to hand tech horseshoe. When reaching with L UE, PT noting minimal trunk rotation and no cervical rotation to scan for horseshoe. He  demonstrates poor dissociation from hips/trunk to maintain appropriate sitting balance when asked to rotate trunk to retrieve object. Patient performing R lateral leans onto R elbow on airex pad and then mat to allow patient to develop comfort leaning R and with profound R weight shift. He was able to return to midline with CGA and Max verbal cues for initiation. Same task completed with L lateral lean to encourage use of UE to push up to midline/ relative R. He required up to [facility] to return to midline due to L UE weakness. In static sitting, patient eventually drifting posterior L. When asked if he could feel which direction he was leaning, patient able to report that he was leaning back, but was unable to determine if he was leaning L (which he was), in the middle or to the R. Question poor truncal  proprioceptive input, but patient is able to determine if he's leaning forward or backward. Slideboard transfer back to wc with MaxA x2 leading L. Once in chair, but before properly positioned, patient noted to have strong L lateral and posterior pushing resulting in L hip before pushed too far anteriorly. TotalA to reposition in chair. TotalA x2 slideboard transfer to bed with PT preventing patient from using R UE to prevent excessive L pushing. TotalA x2 to return supine due to truncal rigidity. MD in/out for assessment. ModA to roll R to assess bottom due to report of pain. No skin breakdown noted. Bed alarm on, call light within reach.  Session 2: Patient received reclined in bed, agreeable to PT.  He continues to report neck/back "stiffness" and pain, but did not rate. PT providing rest breaks, distractions, repositioning and replaced heat pack when session was over. He was able to come sit edge of bed with MaxA x2. Unable to attempt log roll technique due to poor truncal dissociation and poor attention to task. Once sitting edge of bed, patient able to maintain somewhat midline posture by reaching for foot of  bed with R UE. If patient became distracted, he would push off of foot of bed instead of holding onto it, requiring consistent redirection to task. Sit <>stand x2 3 musketeers assist, MaxA x2. Patient maintaining forward flexed posture despite max multi modal cues for upright posture. Patient with increased flexor tone to L LE with PT unable to assist with achieving full flat foot contact to ground with L foot. PT also unable to fully extend L knee in stance. When returning to sit, patient with such significant truncal rigidity, he had difficulty further flexing his hips to return buttocks to bed until PT manually facilitated hip flexion. Patient requiring TotalA x2 to return supine. Bed alarm on, call light within reach.   Therapy Documentation Precautions:  Precautions Precautions: Fall,Other (comment) Precaution Comments: L hemiplegia Restrictions Weight Bearing Restrictions: No    Therapy/Group: Individual Therapy  Karoline Caldwell, PT, DPT, CBIS  10/23/2020, 7:39 AM

## 2020-10-23 NOTE — Progress Notes (Signed)
Speech Language Pathology Daily Session Note  Patient Details  Name: Lance [name] MRN: [id number] Date of Birth: [birth year]  Today's Date: 10/23/2020 SLP Individual Time: 1300-1345 SLP Individual Time Calculation (min): 45 min  Short Term Goals: Week 1: SLP Short Term Goal 1 (Week 1): Patient will name at least 10 items in a category within 90 seconds with minA. SLP Short Term Goal 2 (Week 1): Patient will orient to day of week, date, year using visual aides (calendar, etc) with modA. SLP Short Term Goal 3 (Week 1): Patient will complete basic level medication label reading interpreting/comprehension and pill box simulation with modA. SLP Short Term Goal 4 (Week 1): Patient will complete functional tasks and simulated tasks ADL's with modA cues for maintaining adequate selective attention. SLP Short Term Goal 5 (Week 1): Patient will answer basic level comprehension and recall questions based on 2-3 sentence text ready aloud by SLP, with 75% accuracy and minA. SLP Short Term Goal 6 (Week 1): Patient will verbally summarize after SLP read aloud of 3-4 sentence text, with 75% accuracy and modA.  Skilled Therapeutic Interventions: Skilled SLP intervention focused on cognition. Mod A with visual aid to orient to date and day of the week. He required increased time with processing for all task completed this session. P t given mod A to name 5+items during simple divergent naming task. He increased accuracy with number of items listed in categories with min A verbal cues. Mod- Max A with visual cues needed to read and understand medication labels. Pt left seated upright in bed with bed alarm set and call button within reach. Cont with therapy per plan of care.   Pain Pain Assessment Pain Scale: Faces Pain Score: 4  Faces Pain Scale: No hurt  Therapy/Group: Individual Therapy  Darrol Poke Jeanie Mccard 10/23/2020, 1:39 PM

## 2020-10-24 LAB — GLUCOSE, CAPILLARY
Glucose-Capillary: 143 mg/dL — ABNORMAL HIGH (ref 70–99)
Glucose-Capillary: 175 mg/dL — ABNORMAL HIGH (ref 70–99)
Glucose-Capillary: 301 mg/dL — ABNORMAL HIGH (ref 70–99)
Glucose-Capillary: 289 mg/dL — ABNORMAL HIGH (ref 70–99)

## 2020-10-24 MED ORDER — OLANZAPINE 2.5 MG PO TABS
2.5000 mg | ORAL_TABLET | Freq: Every day | ORAL | Status: DC
  Administered 2020-10-24 – 2020-10-30 (×7): 2.5 mg via ORAL
  Filled 2020-10-24 (×7): qty 1

## 2020-10-24 MED ORDER — TIZANIDINE HCL 2 MG PO TABS
2.0000 mg | ORAL_TABLET | Freq: Three times a day (TID) | ORAL | Status: DC
  Administered 2020-10-24 – 2020-11-01 (×25): 2 mg via ORAL
  Filled 2020-10-24 (×24): qty 1

## 2020-10-24 NOTE — Progress Notes (Signed)
PROGRESS NOTE   Subjective/Complaints: Pt c/o "HA " but is pointing to post cervical area, discussed CT neck showing arthritic changes Pt notes he was on a med at night that helped him sleep, review of meds showed pt on Zyprexa Per PT with standing pt is in a "flamingo posture"  ROS- no c/o coccyx pain, neg CP, SOB, N/V/D,   Objective:   No results found. No results for input(s): WBC, HGB, HCT, PLT in the last 72 hours. No results for input(s): NA, K, CL, CO2, GLUCOSE, BUN, CREATININE, CALCIUM in the last 72 hours.  Intake/Output Summary (Last 24 hours) at 10/24/2020 0811 Last data filed at 10/24/2020 0750 Gross per 24 hour  Intake 660 ml  Output --  Net 660 ml        Physical Exam: Vital Signs Blood pressure (!) 145/90, pulse 85, temperature 98.3 F (36.8 C), temperature source Oral, resp. rate 18, weight 77.9 kg, SpO2 100 %.  General: No acute distress Mood and affect are appropriate Heart: Regular rate and rhythm no rubs murmurs or extra sounds Lungs: Clear to auscultation, breathing unlabored, no rales or wheezes Abdomen: Positive bowel sounds, soft nontender to palpation, nondistended Extremities: No clubbing, cyanosis, or edema Skin: No evidence of breakdown, no evidence of rash  Neurologic: Cranial nerves II through XII intact, motor strength is 5/5 in right  deltoid, bicep, tricep, grip, hip flexor, knee extensors, ankle dorsiflexor and plantar flexor 3- L finger flexors and trace delt, no active movemnt LLE,  + LLE flexor withdrawal, MAS 3 in L knee flexors  Sensory exam normal sensation to light touch and proprioception in bilateral upper and lower extremities Cerebellar exam normal finger to nose to finger as well as heel to shin in bilateral upper and lower extremities Musculoskeletal: Full range of motion in all 4 extremities. No joint swelling, mild tenderness C spine , no trap pain to palp, reduced C  spine ROM     Assessment/Plan: 1. Functional deficits which require 3+ hours per day of interdisciplinary therapy in a comprehensive inpatient rehab setting.  Physiatrist is providing close team supervision and 24 hour management of active medical problems listed below.  Physiatrist and rehab team continue to assess barriers to discharge/monitor patient progress toward functional and medical goals  Care Tool:  Bathing    Body parts bathed by patient: Chest,Abdomen,Left arm,Right upper leg,Left upper leg,Face   Body parts bathed by helper: Front perineal area,Buttocks,Right arm,Right lower leg,Left lower leg Body parts n/a: Right arm,Front perineal area,Buttocks,Right lower leg,Left lower leg   Bathing assist Assist Level: 2 Helpers     Upper Body Dressing/Undressing Upper body dressing   What is the patient wearing?: Pull over shirt    Upper body assist Assist Level: Maximal Assistance - Patient 25 - 49%    Lower Body Dressing/Undressing Lower body dressing      What is the patient wearing?: Incontinence brief,Pants     Lower body assist Assist for lower body dressing: 2 Helpers     Toileting Toileting    Toileting assist Assist for toileting: Dependent - Patient 0%     Transfers Chair/bed transfer  Transfers assist  Chair/bed transfer activity  did not occur: Safety/medical concerns  Chair/bed transfer assist level: 2 Helpers     Locomotion Ambulation   Ambulation assist   Ambulation activity did not occur: Safety/medical concerns          Walk 10 feet activity   Assist  Walk 10 feet activity did not occur: Safety/medical concerns        Walk 50 feet activity   Assist Walk 50 feet with 2 turns activity did not occur: Safety/medical concerns         Walk 150 feet activity   Assist Walk 150 feet activity did not occur: Safety/medical concerns         Walk 10 feet on uneven surface  activity   Assist Walk 10 feet on uneven  surfaces activity did not occur: Safety/medical concerns         Wheelchair     Assist Will patient use wheelchair at discharge?: Yes (TBD but likely) Type of Wheelchair: Manual           Wheelchair 50 feet with 2 turns activity    Assist    Wheelchair 50 feet with 2 turns activity did not occur: Safety/medical concerns       Wheelchair 150 feet activity     Assist  Wheelchair 150 feet activity did not occur: Safety/medical concerns       Blood pressure (!) 145/90, pulse 85, temperature 98.3 F (36.8 C), temperature source Oral, resp. rate 18, weight 77.9 kg, SpO2 100 %.  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 Antithrombotics: -DVT/anticoagulation:SCDs -antiplatelet therapy: N/A 3. Pain management:Tylenol as needed  5/16: topamax 25mg  started at night for headache/insomnia. Lidocaine patch added for coccyx pain 4. Mood/schizophrenia:Provide emotional support- was on Zyprexa 5 mg at home- will resume QHS which should help with sleep  -antipsychotic agents: resume Zyprexa 5. Neuropsych: This patientiscapable of making decisions on hisown behalf. 6. Skin/Wound Care:Routine skin checks 7. Fluids/Electrolytes/Nutrition:Routine in and outs with follow-up chemistries 8. Hypertension. Continue Norvasc 10mg  daily Vitals:   10/23/20 1940 10/24/20 0434  BP: 134/74 (!) 145/90  Pulse: 87 85  Resp: 18 18  Temp: 98.2 F (36.8 C) 98.3 F (36.8 C)  SpO2: 100% 100%  GOal <161 mmHg systolic , started 10mg  of amlodipine on 5/13, overall improved cont to  mionitor  9. Diabetes mellitus. Hemoglobin A1c 10.1. NovoLog increased to 9units 3 times daily, Levemir 22units nightly CBG (last 3)  Recent Labs    10/23/20 1138 10/23/20 1616 10/23/20 2104  GLUCAP 183* 227* 112*  Monitor on current doses, pm elevation , increase novalog at mealtimes , await am CBG   10. History of alcohol polysubstance use. Urine drug screen positive cocaine. Provide counseling 11. History of hepatitis C. Follow-up outpatient 12. L foot drop- suggest possible AFO, etc and PRAFO to reduce risk of L ankle contracture 13. L inattention- per primary team  14.   Hx of schizophrenia resume Zyprexa LOS: 5 days A FACE TO FACE EVALUATION WAS PERFORMED  Charlett Blake 10/24/2020, 8:11 AM

## 2020-10-24 NOTE — Progress Notes (Signed)
Occupational Therapy Session Note  Patient Details  Name: Lance [name] MRN: [id number] Date of Birth: 05/16/1946  Today's Date: 10/24/2020 OT Individual Time: 7425-9563 OT Individual Time Calculation (min): 24 min    Short Term Goals: Week 1:  OT Short Term Goal 1 (Week 1): Pt will complete UB bathing with min assist in support sitting for two consecutive sessions. OT Short Term Goal 2 (Week 1): Pt will maintain static sitting balance for 5 mins with no more than mod assist in preparation for selfcare tasks. OT Short Term Goal 3 (Week 1): Pt will donn pullover shirt with mod assist in supported sitting following hemi techniques. OT Short Term Goal 4 (Week 1): Pt will complete toilet transfer to the drop arm commode with max assist squat pivot. OT Short Term Goal 5 (Week 1): Pt will use the LUE with bathing tasks with no more than min assist to wash the RUE.  Skilled Therapeutic Interventions/Progress Updates:    Session 1: [phone])  Pt in tilt in space [id number] to start session.  Had him work on cervical PROM/AROM from this position.  Pt still with very limited cervical rotation in both directions with increased posterior and lateral neck pain on both sides.  Began with soft tissue work on scalenes and sternocleidomastoid on the left side as this was the most impaired pain wise per his report.  Next, had him work on rotation and lateral flexion place and hold for periods of 30 seconds with therapist completing stretch to a level of 5/10 on the pain scale per pt report.  He was able to tolerate only approximately 20-25 degrees of PROM to either side and only 15 degrees or less with lateral flexion to either side.  Educated pt on completing AROM/PROM exercises for rotation throughout the day to assist with reducing pain and increasing AROM.  He was left up in the tilt in space wheelchair with the call button and phone in reach and nursing present to administer medications.     Session 2:  ([id number])  Pt in tilt in space wheelchair to start session requesting to go back to the bed.  Discussed that therapy would help him back to the bed at conclusion of session.  Took him down to the dayroom where he completed sliding board transfer to the therapy mat with total +2 (pt 25%) to the left.  Increased pushing noted with therapist using Bobath technique and having pt try and bring his head and trunk to the right of therapist with forward flexion to help decrease left side pushing.  Once on the mat, provided mirror for visual feedback on sitting posture as he pushes to the left and tends to maintain flexed head and trunk.  He needed max assist for maintaining static sitting balance with focus of session on functional reaching from seated position to the right with the RUE.  When resting had pt lean his right forearm on therapist's leg to limit pushing.  He returned to the wheelchair with total assist +2 (pt 25%) to the right via sliding board with pt being instructed to use the RUE to hold on to the left hand during transfer.  Completed squat pivot to the left for transfer back to the bed once in the room at the above stated levels.  Total assist for transition to supine from seated position.  Call button and phone in reach with safety alarm in place.      Therapy Documentation Precautions:  Precautions Precautions: Fall,Other (comment)  Precaution Comments: L hemiplegia, pusher to the left Restrictions Weight Bearing Restrictions: No General:   Vital Signs: Therapy Vitals BP: (!) 142/88 Pain: Pain Assessment Pain Scale: Faces Pain Score: 6  Faces Pain Scale: Hurts little more Pain Type: Acute pain Pain Location: Neck Pain Orientation: Mid Pain Radiating Towards: head Pain Descriptors / Indicators: Aching;Discomfort Pain Frequency: Intermittent Pain Onset: With Activity Patients Stated Pain Goal: 0 Pain Intervention(s): Repositioned Multiple Pain Sites: No ADL: Therapy/Group:  Individual Therapy  Keelee Yankey,[place] OTR/L 10/24/2020, 9:50 AM

## 2020-10-24 NOTE — Progress Notes (Signed)
Physical Therapy Session Note  Patient Details  Name: Lance [name] MRN: [id number] Date of Birth: 1946/01/30  Today's Date: 10/24/2020 PT Individual Time: 0800-0855 PT Individual Time Calculation (min): 55 min   Short Term Goals: Week 1:  PT Short Term Goal 1 (Week 1): Pt will perform rolling R/L in bed with max assist of 1 PT Short Term Goal 2 (Week 1): Pt will perform supine>sitting EOB with max assist of 2 PT Short Term Goal 3 (Week 1): Pt will perform sit>supine with mod assist of 2 PT Short Term Goal 4 (Week 1): Pt will perform bed<>chair transfer with +2 max assist using equipment as needed  Skilled Therapeutic Interventions/Progress Updates:   Patient received supine in bed, asleep, but easy to wake. He reports 10/10 pain in head, but points to posterior neck. RN aware and provided AM and pain rx partway through session. He required extensive encouragement to participate in therapy. He reports that he feels as though he "only needs a good neck massage" demonstrating poor and limited insight into his deficits. MD in/out for daily assessment. He required MaxA to don pants at bedlevel with grossly ModA to roll to pull up pants. TotalA to come to sit edge of bed with significant truncal rigidity maintained throughout. Verbal cues to have patient hold onto foot of bed with R UE to prevent L lateral LOB. Sitting edge of bed, question truncal rigidity vs extensor tone vs poor proprioceptive input. Slideboard transfer to wc with MaxA x2 leading R. PT transporting patient in wc to therapy gym for time management and energy conservation. Slideboard transfer MaxA x2 to therapy mat. Prior to transferring, patient consistently putting his R LE onto therapy mat blocking transfer and had difficulty understanding why R LE had to be down in order to transfer. Patient requiring up to TotalA to maintain static sitting balance edge of mat. Mirror used for Warden/ranger. Patient with poor attention to task  requiring consistent cuing to use mirror for visual feedback. Patient with perpetual L lateral LOB with poor ability to return to midline independently. He was able to remain sitting midline with close supervision for a max of 10s at a time, but only 1 instance of this. Patient intermittently pushing to the L as well. Slideboard transfer back to wc with MaxA x2 leading L. In chair, patient requiring TIS wc to be tilted all the way back to prevent sliding out of chair due to truncal rigidity vs extensor tone vs poor proprioception. Patient remaining up in wc, seatbelt alarm on, call light within reach.    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 Restrictions Weight Bearing Restrictions: No    Therapy/Group: Individual Therapy  Karoline Caldwell, PT, DPT, CBIS  10/24/2020, 7:38 AM

## 2020-10-24 NOTE — Progress Notes (Signed)
Physical Therapy Session Note  Patient Details  Name: Lance [name] MRN: [id number] Date of Birth: [birth year]  Today's Date: 10/24/2020 PT Individual Time: 1435-1501 PT Individual Time Calculation (min): 26 min   Short Term Goals: Week 1:  PT Short Term Goal 1 (Week 1): Pt will perform rolling R/L in bed with max assist of 1 PT Short Term Goal 2 (Week 1): Pt will perform supine>sitting EOB with max assist of 2 PT Short Term Goal 3 (Week 1): Pt will perform sit>supine with mod assist of 2 PT Short Term Goal 4 (Week 1): Pt will perform bed<>chair transfer with +2 max assist using equipment as needed  Skilled Therapeutic Interventions/Progress Updates:    Pt received supine in bed and initially not agreeable to therapy session stating he "didn't sleep well last night" because his "head was hurting." With minimal encouragement pt agreeable to session focused on sitting balance/trunk control while eating a snack. Supine>sitting L EOB, HOB flat but using bedrail, via logroll technique with min/mod assist for rolling and +2 max assist for trunk upright - max multimodal cuing for sequencing. Sitting EOB with varying max assist to close supervision due to repeated either L or posterior trunk LOB - cuing for R hand positioning towards foot of the bed (avoiding using of R UE support on footboard to prevent pt from having impaired motor planning of balance recovery strategies) with visual feedback for midline orientation. Pt consumed snack with no signs of swallowing difficulty. At end of session returned to supine via reverse logroll technique with +2 mod assist for trunk descent and L LE management onto bed - cuing for sequencing. Pt left supine in bed, HOB elevated, with needs in reach and bed alarm on.  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 Restrictions Weight Bearing Restrictions:  No  Pain: Continues to report L posterior neck pain, unrated, but increases during transition from supine>sitting EOB. Provided rest, repositioning, emotional support, and distraction for pain management. Pt using K-pad heat before and after session.   Therapy/Group: Individual Therapy  Tawana Scale , PT, DPT, CSRS  10/24/2020, 3:14 PM

## 2020-10-25 LAB — GLUCOSE, CAPILLARY
Glucose-Capillary: 178 mg/dL — ABNORMAL HIGH (ref 70–99)
Glucose-Capillary: 129 mg/dL — ABNORMAL HIGH (ref 70–99)
Glucose-Capillary: 318 mg/dL — ABNORMAL HIGH (ref 70–99)
Glucose-Capillary: 195 mg/dL — ABNORMAL HIGH (ref 70–99)

## 2020-10-25 MED ORDER — INSULIN ASPART 100 UNIT/ML IJ SOLN
10.0000 [IU] | Freq: Three times a day (TID) | INTRAMUSCULAR | Status: DC
  Administered 2020-10-25 – 2020-11-17 (×62): 10 [IU] via SUBCUTANEOUS

## 2020-10-25 MED ORDER — BISACODYL 10 MG RE SUPP: 10.0000 mg | Freq: Every day | RECTAL | Status: DC | PRN

## 2020-10-25 MED ORDER — SORBITOL 70 % SOLN
30.0000 mL | Freq: Every day | Status: DC | PRN
  Administered 2020-10-25: 30 mL via ORAL

## 2020-10-25 NOTE — Progress Notes (Addendum)
Patient ID: Lance [name], male   DOB: Jun 24, 1945, 75 y.o.   MRN: [id number] Team Conference Report to Patient/Family  Team Conference discussion was reviewed with the patient and caregiver, including goals, any changes in plan of care and target discharge date.  Patient and caregiver express understanding and are in agreement.  The patient has a target discharge date of 11/17/20.   Sw made attempt to call pt daughter, [name]. No answer-unable to leave VM. Sw reached out to pt primary daughter, [name]. Provided dtr with conference updates. Informed family that pt will require extensive assistance at discharge and anticipating MOD/MAX goals. Pt dtr [name], states that she has been providing care for pt primarily, but pt will have assistance at home. Pt daughters, son in law and an additional person will assist pt at discharge. Pt dtr [name] mentioned she will be returning to town on next week and will be here to start observing on Monday. SW will plan to schedule extensive family education closer to discharge. Sw and dtr [name] will begin working on discharge planning for pt once she returns. [name] plans to rearrange work schedule to be able to assist pt at home. No additional questions or concern, sw will cont to follow up.     [name] 10/25/2020, 11:46 AM

## 2020-10-25 NOTE — Progress Notes (Signed)
Physical Therapy Session Note  Patient Details  Name: Lance [name] MRN: [id number] Date of Birth: [birth year]  Today's Date: 10/25/2020 PT Individual Time: 0900-1025 PT Individual Time Calculation (min): 85 min   Short Term Goals: Week 1:  PT Short Term Goal 1 (Week 1): Pt will perform rolling R/L in bed with max assist of 1 PT Short Term Goal 2 (Week 1): Pt will perform supine>sitting EOB with max assist of 2 PT Short Term Goal 3 (Week 1): Pt will perform sit>supine with mod assist of 2 PT Short Term Goal 4 (Week 1): Pt will perform bed<>chair transfer with +2 max assist using equipment as needed  Skilled Therapeutic Interventions/Progress Updates:    Patient received reclined in bed, agreeable to PT. He reports 10/10 headache, but points to posterior cervical area. RN present to provide am and pain rx, PT providing rest breaks, distractions and repositioning to assist with pain management. Patient found to have been incontinent of bladder requiring TotalA brief change at bedlevel. He was able to wipe peri area with washcloth and max cues for thoroughness. Patient rolling in bed to don pants with ModA/MaxA to roll and TotalA to don pants. Maximove used to transfer to wc and PT transporting patient to therapy gym for time management. TotalA x2 slideboard transfer to therapy mat leading L with PT preventing patient from using R UE because he would tend to push trunk too far L making slideboard transfer unsafe. Static sitting balance edge of mat with mirror for visual feedback with line drawn down center to assist with midline orientation. Patient with strong pushing L with R UE and originating at trunk as well despite the above noted interventions. PT facilitating patient reaching R outside BOS to manipulate cones to offer prolonged R lateral lean. Patient with increased forward trunk flexion with this task with limited ability to come sit back up. Prolonged R lateral lean onto R elbow to allow time  to "recalibrate." After doing this, patient actually able to remain sitting midline with no UE propping and close supervision for ~1.24mins. Patient completing "recalibration" + static sitting balance for ~1-2 mins x3 trials. Patient completing sit <> stand with 3 musketeers MaxA x2. Mirror for visual feedback on posture + shirt with vertical tape line. Patient able to achieve more upright posture + PT TotalA to improve L foot contact to minimize L knee flexor tone. Patient with minimizing L flexor tone, but remains with flexor withdrawal. Patient reports needing to use bathroom. TotalA x2 slideboard transfer to wc. Patient transferring to bed via slideboard transfer [place] x2, TotalA x2 to return supine and roll to place bed pan. Patient remaining in bed, bed pan in place, bed alarm on, call light within reach.   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 Restrictions Weight Bearing Restrictions: No    Therapy/Group: Individual Therapy  Karoline Caldwell, PT, DPT, CBIS  10/25/2020, 7:40 AM

## 2020-10-25 NOTE — Patient Care Conference (Signed)
Inpatient RehabilitationTeam Conference and Plan of Care Update Date: 10/25/2020   Time: 10:30 AM    Patient Name: Lance [name]      Medical Record Number: [id number]  Date of Birth: 18-Jan-1946 Sex: Male         Room/Bed: 4W24C/4W24C-01 Payor Info: Payor: [employer] MEDICARE / Plan: [place] [employer] / Product Type: *No Product type* /    Admit Date/Time:  10/19/2020  1:15 PM  Primary Diagnosis:  <principal problem not specified>  Hospital Problems: Active Problems:   Intraparenchymal hemorrhage of brain [facility])    Expected Discharge Date: Expected Discharge Date: 11/17/20  Team Members Present: Physician leading conference: Dr. Alysia Penna Care Coodinator Present: Dorien Chihuahua, RN, BSN, CRRN;Christina Makena, [place] Nurse Present: Dorien Chihuahua, RN PT Present: Estevan Ryder, PT OT Present: Clyda Greener, OT SLP Present: Other (comment) Gunnar Fusi, SLP) PPS Coordinator present : Gunnar Fusi, SLP     Current Status/Progress Goal Weekly Team Focus  Bowel/Bladder   Pt is incont of B/B. LB 10/21/20.  Pt gains cont. of B/B  [place] patient q2h and PRN   Swallow/Nutrition/ Hydration             ADL's   Mod assist for UB selfcare supported sitting.  Total assist +2 for LB selfcare sit to stand.  LUE currently more distal movement compared to proximal.  Brunnstrum IV in the arm and stage VI in the hand  min to mod assist  selfcare retraining, transfer training, DME education, balance retraining, neuromuscular re-education, pt education   Mobility   +2 bed mob, +2 SBT, +2 STS 3 musketeers, significant rigidity vs extensor tone in trunk, L LE flexor tone + flexor withdrawal, STRONG pushing to the L despite any intervention  ModA overall, honestly probably lofty  activity tolerance, static sitting balance, transfers, pt education, dc planning (likely will need SNF placement )   Communication   Mod A  supervision basic, minA mild complex  auditory comprehenison    Safety/Cognition/ Behavioral Observations  Mod A basic, mod-max A mild  supervision basic, minA mild complex  attention, problem solving, awareness and initiation   Pain   Pt states pain 10/10 on pain scale in back of neck/head.Pt currently getting tylenol and topiramate prn  Pain <3 out of 10  Assess for pain qshift and PRN and provide medication  and pain relieving methods as needed   Skin   Skin intact  Skin remains free of injury  Assess skin qshift and provide skin care PRN     Discharge Planning:  pt to d/c home with dtrs to provide care   Team Discussion: Left spastic hemiparesis, dysarthria and poor sitting balance post IPH. Hx of DM, and Schizophrenia; MD ordered Zyprexa as at home. Neck pain/posterior-occipital head area; using a kpad and lidoderm patch. MD added zanaflex. Progress limited by strong pusher syndrome, flexor tone and withdrawal, poor initiation and poor trunchal proprioception.  Patient on target to meet rehab goals: Currently mod assit for upper body care and total assist +2 for lower body care at the bed level. +2 assist for slide-board transfers and sit- stand 3 musketeer style. Mod assist goals set for discharge  *See Care Plan and progress notes for long and short-term goals.   Revisions to Treatment Plan:  Mirror therapy 3 musketeer style transfers  Teaching Needs: Transfers, toileting, medications, secondary stroke risk management, etc.  Current Barriers to Discharge: Decreased caregiver support, Home enviroment access/layout and Incontinence and lack of insurance for SNF  placement  Possible Resolutions to Barriers: Extensive family education     Medical Summary Current Status: No issues overnite, Left spastic hemi persists, restarted on Zyprexa , home med for sleep  Barriers to Discharge: Medical stability;Other (comments)  Barriers to Discharge Comments: spasticity Possible Resolutions to Barriers/Weekly Focus: may need to adjust spasticity  meds, may need botulinum toxin, monitor for sedation   Continued Need for Acute Rehabilitation Level of Care: The patient requires daily medical management by a physician with specialized training in physical medicine and rehabilitation for the following reasons: Direction of a multidisciplinary physical rehabilitation program to maximize functional independence : Yes Medical management of patient stability for increased activity during participation in an intensive rehabilitation regime.: Yes Analysis of laboratory values and/or radiology reports with any subsequent need for medication adjustment and/or medical intervention. : Yes   I attest that I was present, lead the team conference, and concur with the assessment and plan of the team.   Dorien Chihuahua B 10/25/2020, 1:36 PM

## 2020-10-25 NOTE — Progress Notes (Signed)
Speech Language Pathology Daily Session Note  Patient Details  Name: Lance [name] MRN: [id number] Date of Birth: 24-Sep-1945  Today's Date: 10/25/2020 SLP Individual Time: 9449-6759 SLP Individual Time Calculation (min): 41 min  Short Term Goals: Week 1: SLP Short Term Goal 1 (Week 1): Patient will name at least 10 items in a category within 90 seconds with minA. SLP Short Term Goal 2 (Week 1): Patient will orient to day of week, date, year using visual aides (calendar, etc) with modA. SLP Short Term Goal 3 (Week 1): Patient will complete basic level medication label reading interpreting/comprehension and pill box simulation with modA. SLP Short Term Goal 4 (Week 1): Patient will complete functional tasks and simulated tasks ADL's with modA cues for maintaining adequate selective attention. SLP Short Term Goal 5 (Week 1): Patient will answer basic level comprehension and recall questions based on 2-3 sentence text ready aloud by SLP, with 75% accuracy and minA. SLP Short Term Goal 6 (Week 1): Patient will verbally summarize after SLP read aloud of 3-4 sentence text, with 75% accuracy and modA.  Skilled Therapeutic Interventions:Skilled ST services focused on cognitive skills. Pt was on bedpan upon entering, NT and nurse assisted SLP in change bedding, pt required heavy max A. Pt requested to call daughter, pt was unable to dial numbers at a fast enough rate but was able to dial as SLP dictated. SLp communicated with pt's daughter about current progress. SLP facilitated basic problem solving and selection attention during 3 step picture card sequencing task, pt required max A verbal cues with accuracy in 1 out 4 opportunities with ability to correct errors with mod A verbal cues. Pt required only supervision A verbal cues for orientated once SLP posted calendar in room. Pt was left in room with call bell within reach and bed alarm set. SLP recommends to continue skilled services.     Pain Pain  Assessment Pain Score: 0-No pain  Therapy/Group: Individual Therapy  Tomasina Keasling  [facility] 10/25/2020, 1:25 PM

## 2020-10-25 NOTE — Progress Notes (Signed)
Occupational Therapy Session Note  Patient Details  Name: Lance [name] MRN: [id number] Date of Birth: Oct 09, 1945  Today's Date: 10/25/2020 OT Individual Time: 1449-1539 OT Individual Time Calculation (min): 50 min    Short Term Goals: Week 1:  OT Short Term Goal 1 (Week 1): Pt will complete UB bathing with min assist in support sitting for two consecutive sessions. OT Short Term Goal 2 (Week 1): Pt will maintain static sitting balance for 5 mins with no more than mod assist in preparation for selfcare tasks. OT Short Term Goal 3 (Week 1): Pt will donn pullover shirt with mod assist in supported sitting following hemi techniques. OT Short Term Goal 4 (Week 1): Pt will complete toilet transfer to the drop arm commode with max assist squat pivot. OT Short Term Goal 5 (Week 1): Pt will use the LUE with bathing tasks with no more than min assist to wash the RUE.  Skilled Therapeutic Interventions/Progress Updates:    Pt in bed during session with focus on bed mobility with activation of the abdominals and learning sequence to be able roll more efficiently for toileting and selfcare tasks.  He was able to roll to the left with min assist without use of the rails and integration of the abdominals.  Min to mod assist is still needed for rolling to the right with mod instructional cueing for sequencing flexing up the left knee with assistance and min assist to reach across midline with the LUE to roll.  Educated pt to work on these techniques with rolling when therapist assists him as well.  Finished session with changing of his brief secondary to bladder incontinence with rolling technique he practiced.  Total assist for all cleaning and donning new brief.  He was left in the bed with the call button and phone in reach and safety alarm in place.    Therapy Documentation Precautions:  Precautions Precautions: Fall,Other (comment) Precaution Comments: L hemiplegia, pusher to the  left Restrictions Weight Bearing Restrictions: No  Pain: Pain Assessment Pain Scale: Faces Pain Score: 0-No pain Faces Pain Scale: Hurts little more Pain Type: Acute pain Pain Location: Neck Pain Orientation: Left Pain Descriptors / Indicators: Discomfort Pain Onset: With Activity Pain Intervention(s): Repositioned ADL: See Care Tool Section for some details of mobility and selfcare  Therapy/Group: Individual Therapy  Odessie Polzin,[place] OTR/L 10/25/2020, 4:06 PM

## 2020-10-25 NOTE — Progress Notes (Addendum)
PROGRESS NOTE   Subjective/Complaints:  No issues overnite , sleeping this am but awakens to voice ROS- no c/o coccyx pain, neg CP, SOB, N/V/D,   Objective:   No results found. No results for input(s): WBC, HGB, HCT, PLT in the last 72 hours. No results for input(s): NA, K, CL, CO2, GLUCOSE, BUN, CREATININE, CALCIUM in the last 72 hours.  Intake/Output Summary (Last 24 hours) at 10/25/2020 0831 Last data filed at 10/25/2020 0700 Gross per 24 hour  Intake 480 ml  Output 1 ml  Net 479 ml        Physical Exam: Vital Signs Blood pressure (!) 158/83, pulse 71, temperature 99.5 F (37.5 C), temperature source Oral, resp. rate 14, weight 77.9 kg, SpO2 100 %.    General: No acute distress Mood and affect are appropriate Heart: Regular rate and rhythm no rubs murmurs or extra sounds Lungs: Clear to auscultation, breathing unlabored, no rales or wheezes Abdomen: Positive bowel sounds, soft nontender to palpation, nondistended Extremities: No clubbing, cyanosis, or edema Skin: No evidence of breakdown, no evidence of rash Neurologic: Cranial nerves II through XII intact, motor strength is 5/5 in right  deltoid, bicep, tricep, grip, hip flexor, knee extensors, ankle dorsiflexor and plantar flexor 3- L finger flexors and trace delt, no active movemnt LLE,  + LLE flexor withdrawal, MAS 2 in L knee flexors improved vs yesterday  Sensory exam normal sensation to light touch and proprioception in bilateral upper and lower extremities Cerebellar exam normal finger to nose to finger as well as heel to shin in bilateral upper and lower extremities Musculoskeletal: Full range of motion in all 4 extremities. No joint swelling, mild tenderness C spine , no trap pain to palp, reduced C spine ROM     Assessment/Plan: 1. Functional deficits which require 3+ hours per day of interdisciplinary therapy in a comprehensive inpatient rehab  setting.  Physiatrist is providing close team supervision and 24 hour management of active medical problems listed below.  Physiatrist and rehab team continue to assess barriers to discharge/monitor patient progress toward functional and medical goals  Care Tool:  Bathing    Body parts bathed by patient: Chest,Abdomen,Left arm,Right upper leg,Left upper leg,Face   Body parts bathed by helper: Front perineal area,Buttocks,Right arm,Right lower leg,Left lower leg Body parts n/a: Right arm,Front perineal area,Buttocks,Right lower leg,Left lower leg   Bathing assist Assist Level: 2 Helpers     Upper Body Dressing/Undressing Upper body dressing   What is the patient wearing?: Pull over shirt    Upper body assist Assist Level: Maximal Assistance - Patient 25 - 49%    Lower Body Dressing/Undressing Lower body dressing      What is the patient wearing?: Incontinence brief,Pants     Lower body assist Assist for lower body dressing: 2 Helpers     Toileting Toileting    Toileting assist Assist for toileting: Dependent - Patient 0%     Transfers Chair/bed transfer  Transfers assist  Chair/bed transfer activity did not occur: Safety/medical concerns  Chair/bed transfer assist level: 2 Helpers     Locomotion Ambulation   Ambulation assist   Ambulation activity did not occur: Safety/medical concerns  Walk 10 feet activity   Assist  Walk 10 feet activity did not occur: Safety/medical concerns        Walk 50 feet activity   Assist Walk 50 feet with 2 turns activity did not occur: Safety/medical concerns         Walk 150 feet activity   Assist Walk 150 feet activity did not occur: Safety/medical concerns         Walk 10 feet on uneven surface  activity   Assist Walk 10 feet on uneven surfaces activity did not occur: Safety/medical concerns         Wheelchair     Assist Will patient use wheelchair at discharge?: Yes (TBD but  likely) Type of Wheelchair: Manual           Wheelchair 50 feet with 2 turns activity    Assist    Wheelchair 50 feet with 2 turns activity did not occur: Safety/medical concerns       Wheelchair 150 feet activity     Assist  Wheelchair 150 feet activity did not occur: Safety/medical concerns       Blood pressure (!) 158/83, pulse 71, temperature 99.5 F (37.5 C), temperature source Oral, resp. rate 14, weight 77.9 kg, SpO2 100 %.  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 Antithrombotics: -DVT/anticoagulation:SCDs -antiplatelet therapy: N/A 3. Pain management:Tylenol as needed  5/16: topamax 25mg  started at night for headache/insomnia. Lidocaine patch added for coccyx pain 4. Mood/schizophrenia:Provide emotional support- was on Zyprexa 5 mg at home- will resume QHS which should help with sleep  -antipsychotic agents: resume Zyprexa 5. Neuropsych: This patientiscapable of making decisions on hisown behalf. 6. Skin/Wound Care:Routine skin checks 7. Fluids/Electrolytes/Nutrition:Routine in and outs with follow-up chemistries 8. Hypertension. Continue Norvasc 10mg  daily Vitals:   10/24/20 1953 10/25/20 0545  BP: 130/81 (!) 158/83  Pulse: 84 71  Resp: 18 14  Temp: 97.8 F (36.6 C) 99.5 F (37.5 C)  SpO2: 100% 100%  Goal <938 mmHg systolic , started 10mg  of amlodipine on 5/13, overall improved cont to monitor   CBG (last 3)  Recent Labs    10/24/20 1619 10/24/20 2115 10/25/20 0614  GLUCAP 301* 289* 178*  Monitor on current  doses, pm elevation , increase novalog at mealtimes , increase novalog to 10U with meals  Cont Levemir  10. History of alcohol polysubstance use. Urine drug screen positive cocaine. Provide counseling 11. History of hepatitis C. Follow-up outpatient 12. L foot drop- suggest possible AFO, etc and PRAFO to reduce risk of L ankle contracture 13. L inattention- per primary team  14.   Hx of schizophrenia resume Zyprexa, 2.5mg  rather than 5mg , pt states he slept well, groggy this am will monitor  LOS: 6 days A FACE TO [place] E Luan Maberry 10/25/2020, 8:31 AM

## 2020-10-25 NOTE — Progress Notes (Signed)
Occupational Therapy Session Note  Patient Details  Name: Lance [name] MRN: [id number] Date of Birth: October 02, 1945  Today's Date: 10/25/2020 OT Individual Time: 1352-1430 OT Individual Time Calculation (min): 38 min    Short Term Goals: Week 1:  OT Short Term Goal 1 (Week 1): Pt will complete UB bathing with min assist in support sitting for two consecutive sessions. OT Short Term Goal 2 (Week 1): Pt will maintain static sitting balance for 5 mins with no more than mod assist in preparation for selfcare tasks. OT Short Term Goal 3 (Week 1): Pt will donn pullover shirt with mod assist in supported sitting following hemi techniques. OT Short Term Goal 4 (Week 1): Pt will complete toilet transfer to the drop arm commode with max assist squat pivot. OT Short Term Goal 5 (Week 1): Pt will use the LUE with bathing tasks with no more than min assist to wash the RUE.  Skilled Therapeutic Interventions/Progress Updates:    Pt received semi-reclined in bed, c/o neck/head pain but reports RN had just applied pain patch. Req extensive encouragement + time to be agreeable to sit EOB for therapy. Came to sitting EOB with total A for RUE placement + to lift trunk + to progress BLE off bed. Req overall max to total A to maintain static sitting balance 2/2 left lean. At times, pt able to maintain static sitting while holding on to bed rail with CGA. Self-drank from cup with overall max A to maintain balance and req A to open juice and pour into cup. Pt reports being aware of L lean and noted to attempt to self-correct without cues. Returned to supine total A. Assisted pt with dialing phone number to reach daughter.  Pt left with HOB greater than 30 degrees, bed alarm engaged, call bell in reach, 4 bed rails up for safety and all immediate needs met.    Therapy Documentation Precautions:  Precautions Precautions: Fall,Other (comment) Precaution Comments: L hemiplegia, pusher to the  left Restrictions Weight Bearing Restrictions: No Pain: see session note ADL: See Care Tool for more details.  Therapy/Group: Individual Therapy  Volanda Napoleon MS, OTR/L  10/25/2020, 2:34 PM

## 2020-10-26 LAB — GLUCOSE, CAPILLARY
Glucose-Capillary: 168 mg/dL — ABNORMAL HIGH (ref 70–99)
Glucose-Capillary: 132 mg/dL — ABNORMAL HIGH (ref 70–99)
Glucose-Capillary: 108 mg/dL — ABNORMAL HIGH (ref 70–99)
Glucose-Capillary: 93 mg/dL (ref 70–99)

## 2020-10-26 NOTE — Progress Notes (Signed)
Occupational Therapy Weekly Progress Note  Patient Details  Name: Lance [name] MRN: [id number] Date of Birth: [birth year]  Beginning of progress report period: Oct 20, 2020 End of progress report period: Oct 26, 2020  Today's Date: 10/26/2020 OT Individual Time: 1404-1501 OT Individual Time Calculation (min): 57 min    Patient has met 1 of 5 short term goals.  Pt is making slower than expected progress at this time with OT sessions.  He continues to demonstrate severe pusher tendencies to the right side in sitting and with attempted standing.  He continues to need mod assist for UB selfcare in supported sitting with total +2 (pt 20%) for LB selfcare sit to stand.  He continues to demonstrate LUE and LLE weakness with greater weakness noted in the leg compared to the arm.  Brunnstrum stage III-IV movement is noted in the arm with stage VI in the hand.  Isolated digit movement is noted in the fingers with ability to use the LUE at a gross assist level for holding items to be opened at a supervision level.  Mod assist is needed for using the LUE to wash the right arm or other parts of the body.  Static sitting balance continues to be at a max assist level with dynamic sitting balance at total assist.  Feel he will continue to need extensive CIR level therapy at this time in order to work toward min to mod assist level goals.      Patient continues to demonstrate the following deficits: muscle weakness, muscle joint tightness and muscle paralysis, impaired timing and sequencing, abnormal tone, unbalanced muscle activation, motor apraxia, decreased coordination and decreased motor planning, field cut, decreased midline orientation, decreased attention to right and decreased motor planning, decreased awareness, decreased problem solving, decreased safety awareness and decreased memory and decreased sitting balance, decreased standing balance, decreased postural control, hemiplegia and decreased balance  strategies and therefore will continue to benefit from skilled OT intervention to enhance overall performance with BADL and Reduce care partner burden.  Patient progressing toward long term goals..  Continue plan of care.  OT Short Term Goals Week 2:  OT Short Term Goal 1 (Week 2): Pt will complete UB bathing with min assist in support sitting for two consecutive sessions. OT Short Term Goal 2 (Week 2): Pt will maintain static sitting balance for 5 mins with no more than mod assist in preparation for selfcare tasks. OT Short Term Goal 3 (Week 2): Pt will use the LUE with bathing tasks with no more than min assist to wash the RUE. OT Short Term Goal 4 (Week 2): Pt will complete toilet transfer to the drop arm commode with max assist squat pivot.  Skilled Interventions/Progress Updates:    Pt in wheelchair to start session, agreeable to working on selfcare tasks this session.  Completed sliding board transfer to the bed from the wheelchair with total +2 (pt 25%) to the EOB for work on bathing tasks.  Pt needed total assist for sitting balance throughout task as he continues to demonstrate severe pushing to the right side with decreased awareness that he is doing it as well a motor planning deficits when attempting to activate his trunk for balance.  Water and washcloths were positioned to the right side to help promote weightshift on the pushing side with functional reaching.  Pt still with increased neck pain at times as well as decreased awareness of why he is having to work on these tasks.  Provided redirection to  current spatial awareness deficits to that he can understand easier, but this will likely need to be repeated and generalized based on current cognitive deficits.  He was able to stand X1 with total +2 (pt 20%) in order to check his brief and complete 2 small steps up toward the top of the bed before laying down.  In standing, he continues to exhibit pushing to the right side as well as LLE  hemiparesis and increased trunk and cervical flexion.  Finished session with transition to supine with max assist and pt left positioned in the bed with the call button and phone in reach and safety belt in place.    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 Restrictions Weight Bearing Restrictions: No  Pain: Pain Assessment Pain Scale: 0-10 Pain Score: 9  Faces Pain Scale: Hurts little more Pain Type: Acute pain Pain Location: Head Pain Orientation: Posterior Pain Descriptors / Indicators: Aching;Headache Pain Onset: On-going Pain Intervention(s): RN made aware Multiple Pain Sites: No ADL: See Care Tool Section for some details of mobility and selfcare  Therapy/Group: Individual Therapy  Avi Kerschner,[place] OTR/L 10/26/2020, 4:23 PM

## 2020-10-26 NOTE — Progress Notes (Signed)
Physical Therapy Session Note  Patient Details  Name: Lance [name] MRN: [id number] Date of Birth: 16-Mar-1946  Today's Date: 10/26/2020 PT Individual Time: 0086-7619 PT Individual Time Calculation (min): 42 min   Short Term Goals: Week 1:  PT Short Term Goal 1 (Week 1): Pt will perform rolling R/L in bed with max assist of 1 PT Short Term Goal 2 (Week 1): Pt will perform supine>sitting EOB with max assist of 2 PT Short Term Goal 3 (Week 1): Pt will perform sit>supine with mod assist of 2 PT Short Term Goal 4 (Week 1): Pt will perform bed<>chair transfer with +2 max assist using equipment as needed  Skilled Therapeutic Interventions/Progress Updates: Pt presents in bed and agreeable to therapy.  Pt states that he needs to be changed.  Pt incontinent of urine, NT notified.  Pt required mod A and verbal cues for rolling side to side using siderail to perform pericare and doff, then don clean brief.  Pt required total A to thread LLE through pants, but then able to pull pants over knees.  Pt performs unilateral bridge to assist pulling pants over hips, although does require mod A to complete.  Pt attempted bridging once LLE placed in hooklying position, although unable to clear hips.  Pt rolled side to side for completing LB dressing.  Pt required total A for log roll transfer to EOB sitting.  Pt w/ strong pushing to Left, but does reach for foot board w/ cueing and improves although still requires hands-on assist.  Pt required Assist of 2 for SB transfer to [place].  Pt required Assist of 2 to scoot back into w/c for positioning.  Pt wheeled to gym for time conservation.  Pt performed reaching forward and to right outside of BOS for cones.  Pt performed reaching forward w/ RUE and then w/ facilitation for LLE w/ TIS reclined.  Pt returned to room and remained sitting in w/c w/ chair alarm on and all needs in reach.  Pillow under LUE for improved positioning.  NT notified of pt in TIS, reclined.      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 Restrictions Weight Bearing Restrictions: No General:   Vital Signs:  Pain:9/10 neck, w/ K-pad. Pain Assessment Pain Scale: 0-10 Pain Score: 9  Faces Pain Scale: Hurts little more Pain Type: Acute pain Pain Location: Head Pain Orientation: Posterior Pain Descriptors / Indicators: Aching;Headache Pain Onset: On-going Patients Stated Pain Goal: 2 Pain Intervention(s): Medication (See eMAR) (tylenol) Mobility:   Locomotion :    Trunk/Postural Assessment :    Balance:   Exercises:   Other Treatments:      Therapy/Group: Individual Therapy  Ladoris Gene 10/26/2020, 11:30 AM

## 2020-10-26 NOTE — Progress Notes (Signed)
Speech Language Pathology Daily Session Note  Patient Details  Name: Lance [name] MRN: [id number] Date of Birth: 21-May-1946  Today's Date: 10/26/2020 SLP Individual Time: 6759-1638 SLP Individual Time Calculation (min): 45 min  Short Term Goals: Week 1: SLP Short Term Goal 1 (Week 1): Patient will name at least 10 items in a category within 90 seconds with minA. SLP Short Term Goal 2 (Week 1): Patient will orient to day of week, date, year using visual aides (calendar, etc) with modA. SLP Short Term Goal 3 (Week 1): Patient will complete basic level medication label reading interpreting/comprehension and pill box simulation with modA. SLP Short Term Goal 4 (Week 1): Patient will complete functional tasks and simulated tasks ADL's with modA cues for maintaining adequate selective attention. SLP Short Term Goal 5 (Week 1): Patient will answer basic level comprehension and recall questions based on 2-3 sentence text ready aloud by SLP, with 75% accuracy and minA. SLP Short Term Goal 6 (Week 1): Patient will verbally summarize after SLP read aloud of 3-4 sentence text, with 75% accuracy and modA.  Skilled Therapeutic Interventions:   Patient seen for skilled ST session focusing on cognitive-linguistic goals. Patient was asleep in bed when SLP entered room and was difficult to arouse but once awake, he was able to maintain alertness for session. He described back of head headache pain "9/10" and SLP notified RN who later arrived with pain medications. Patient unable to name more than one object in category even with extended time and SLP providing semantic cues. He was able to recall some of discharge planning that has been completed with his one daughter but was not completely oriented to planned discharge date. Patient continues to require significant amount of time to initiate and demonstrate adequate cognitive processing during problem solving and linguistic tasks. He continues to benefit from  skilled SLP intervention to maximize cognitive-linguistic abilities prior to discharge.  Pain Pain Assessment Pain Scale: 0-10 Pain Score: 9  Pain Type: Acute pain Pain Location: Head Pain Orientation: Posterior Pain Descriptors / Indicators: Aching;Headache Pain Onset: On-going Pain Intervention(s): RN made aware Multiple Pain Sites: No  Therapy/Group: Individual Therapy  Sonia Baller, MA, CCC-SLP Speech Therapy

## 2020-10-26 NOTE — Progress Notes (Signed)
PROGRESS NOTE   Subjective/Complaints:   No issues overnite, discussed elevated CBG with pt and dietary , he had cake yesterday afternoon, discussed pushing to Left an dweakness Also discussed need for physical assist post discharge   ROS- no c/o coccyx pain, neg CP, SOB, N/V/D,   Objective:   No results found. No results for input(s): WBC, HGB, HCT, PLT in the last 72 hours. No results for input(s): NA, K, CL, CO2, GLUCOSE, BUN, CREATININE, CALCIUM in the last 72 hours.  Intake/Output Summary (Last 24 hours) at 10/26/2020 0816 Last data filed at 10/25/2020 2025 Gross per 24 hour  Intake 840 ml  Output --  Net 840 ml        Physical Exam: Vital Signs Blood pressure (!) 155/83, pulse 68, temperature 98 F (36.7 C), temperature source Oral, resp. rate 16, weight 77.9 kg, SpO2 100 %.  General: No acute distress Mood and affect are appropriate Heart: Regular rate and rhythm no rubs murmurs or extra sounds Lungs: Clear to auscultation, breathing unlabored, no rales or wheezes Abdomen: Positive bowel sounds, soft nontender to palpation, nondistended Extremities: No clubbing, cyanosis, or edema Skin: No evidence of breakdown, no evidence of rash   Neurologic: Cranial nerves II through XII intact, motor strength is 5/5 in right  deltoid, bicep, tricep, grip, hip flexor, knee extensors, ankle dorsiflexor and plantar flexor 3- L finger flexors and trace delt, no active movemnt LLE,  + LLE flexor withdrawal, MAS 2 in L knee flexors improved vs yesterday  Sensory exam normal sensation to light touch and proprioception in bilateral upper and lower extremities Cerebellar exam normal finger to nose to finger as well as heel to shin in bilateral upper and lower extremities Musculoskeletal: Full range of motion in all 4 extremities. No joint swelling, mild tenderness C spine , no trap pain to palp, reduced C spine ROM      Assessment/Plan: 1. Functional deficits which require 3+ hours per day of interdisciplinary therapy in a comprehensive inpatient rehab setting.  Physiatrist is providing close team supervision and 24 hour management of active medical problems listed below.  Physiatrist and rehab team continue to assess barriers to discharge/monitor patient progress toward functional and medical goals  Care Tool:  Bathing    Body parts bathed by patient: Chest,Abdomen,Left arm,Right upper leg,Left upper leg,Face   Body parts bathed by helper: Front perineal area,Buttocks,Right arm,Right lower leg,Left lower leg Body parts n/a: Right arm,Front perineal area,Buttocks,Right lower leg,Left lower leg   Bathing assist Assist Level: 2 Helpers     Upper Body Dressing/Undressing Upper body dressing   What is the patient wearing?: Pull over shirt    Upper body assist Assist Level: Maximal Assistance - Patient 25 - 49%    Lower Body Dressing/Undressing Lower body dressing      What is the patient wearing?: Incontinence brief,Pants     Lower body assist Assist for lower body dressing: 2 Helpers     Toileting Toileting    Toileting assist Assist for toileting: Dependent - Patient 0%     Transfers Chair/bed transfer  Transfers assist  Chair/bed transfer activity did not occur: Safety/medical concerns  Chair/bed transfer assist level:  2 Helpers     Locomotion Ambulation   Ambulation assist   Ambulation activity did not occur: Safety/medical concerns          Walk 10 feet activity   Assist  Walk 10 feet activity did not occur: Safety/medical concerns        Walk 50 feet activity   Assist Walk 50 feet with 2 turns activity did not occur: Safety/medical concerns         Walk 150 feet activity   Assist Walk 150 feet activity did not occur: Safety/medical concerns         Walk 10 feet on uneven surface  activity   Assist Walk 10 feet on uneven surfaces  activity did not occur: Safety/medical concerns         Wheelchair     Assist Will patient use wheelchair at discharge?: Yes (TBD but likely) Type of Wheelchair: Manual           Wheelchair 50 feet with 2 turns activity    Assist    Wheelchair 50 feet with 2 turns activity did not occur: Safety/medical concerns       Wheelchair 150 feet activity     Assist  Wheelchair 150 feet activity did not occur: Safety/medical concerns       Blood pressure (!) 155/83, pulse 68, temperature 98 F (36.7 C), temperature source Oral, resp. rate 16, weight 77.9 kg, SpO2 100 %.  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 Antithrombotics: -DVT/anticoagulation:SCDs -antiplatelet therapy: N/A 3. Pain management:Tylenol as needed  5/16: topamax 25mg  started at night for headache/insomnia. Lidocaine patch added for coccyx pain 4. Mood/schizophrenia:Provide emotional support- was on Zyprexa 5 mg at home- will resume QHS which should help with sleep  -antipsychotic agents: resume Zyprexa 5. Neuropsych: This patientiscapable of making decisions on hisown behalf. 6. Skin/Wound Care:Routine skin checks 7. Fluids/Electrolytes/Nutrition:Routine in and outs with follow-up chemistries 8. Hypertension. Continue Norvasc 10mg  daily Vitals:   10/25/20 2022 10/26/20 0450  BP: 129/73 (!) 155/83  Pulse: 81 68  Resp: 16 16  Temp: 98.2 F (36.8 C) 98 F (36.7 C)  SpO2: 99% 100%  Goal <762 mmHg systolic , started 10mg  of amlodipine on 5/13, overall improved cont  to monitor   CBG (last 3)  Recent Labs    10/25/20 1641 10/25/20 2121 10/26/20 0607  GLUCAP 318* 195* 168*  Monitor on current doses, pm elevation , increase novalog at mealtimes , increase novalog to 10U with meals  Cont Levemir  10. History of alcohol polysubstance use. Urine drug screen positive cocaine. Provide counseling 11. History of hepatitis C. Follow-up outpatient 12. L foot drop- suggest possible AFO, etc and PRAFO to reduce risk of L ankle contracture 13. L inattention- per primary team  14.   Hx of schizophrenia resume Zyprexa, 2.5mg  rather than 5mg , pt states he slept well, groggy this am will monitor  LOS: 7 days A FACE TO Lance [name] 10/26/2020, 8:16 AM

## 2020-10-26 NOTE — Progress Notes (Signed)
Physical Therapy Session Note  Patient Details  Name: Lance [name] MRN: [id number] Date of Birth: 10-Jun-1946  Today's Date: 10/26/2020 PT Individual Time: 1453-1601 PT Individual Time Calculation (min): 68 min   Short Term Goals: Week 1:  PT Short Term Goal 1 (Week 1): Pt will perform rolling R/L in bed with max assist of 1 PT Short Term Goal 2 (Week 1): Pt will perform supine>sitting EOB with max assist of 2 PT Short Term Goal 3 (Week 1): Pt will perform sit>supine with mod assist of 2 PT Short Term Goal 4 (Week 1): Pt will perform bed<>chair transfer with +2 max assist using equipment as needed  Skilled Therapeutic Interventions/Progress Updates:    Pt received supine in bed and agreeable to therapy session. Noted to be incontinent of bladder - pt states he was aware of this and was calling for assistance. Rolling R in bed with mod/max assist and then L with lighter min/mod assist and cuing for carryover of bed mobility training performed during OT session yesterday due to pt wanting to rely heavily on R UE pulling on bedrail therefore removed access to bedrails to promote increased core activation. Total assist LB clothing management and peri-care. Supine>sitting L EOB via logroll technique with +2 max assist for trunk upright and B LE management with pt immediately reaching for footboard with R UE to keep sitting balance once upright - cued for placing R hand on bed towards footboard instead to decrease compensation with R UE strength for weak core muscles. L lateral scoot transfer EOB>TIS w/c with +2 assist for board placement requiring 1 to assist with sitting balance while board being positioned and then +2 total assist for scooting hips - cuing for head/hips relationship and maintaining anterior trunk lean/weight shift while.  Transported to/from gym in w/c for time management and energy conservation. R lateral scoot TIS w/c>EOM using transfer board with +2 total assist; however, with  cuing/facilitaiton to keep R UE extended and abducted pt able to transfer in this direction with increased ease compared to towards L though requiring same amount of assistance due to impaired motor planning and muscle activation with paresis. Participated in sitting balance/trunk control tasks with mirror feedback targeting achieving midline orientation with trunk upright and sustaining this position - pt repeatedly demos L LOB/lean when becoming distracted requiring total assist to prevent LOB - also demos intermittent anterior trunk LOB requiring total assist to maintain upright - cued pt to weight shift towards R and place his shoulder touching the therapy tech's shoulder and as long as pt sustaining attention to the task he is able to complete with close guarding for up to 30seconds; however the second pt becomes distracted either internally or externally he will have total L anterior LOB. Applied tape down middle of pt's paper scrub top and drew line on mirror to improve pt's understanding of midline orientation with increased spatial awareness; however, noticed same as above that pt able to achieve and sustain this position as long as attending to the task and using mirror feedback otherwise has immediate L anterior LOB with no righting reactions requiring total assist to maintain upright. L lateral scoot EOM>TIS w/c with +2 total assist using transfer board as above. Transported back to room and pt requesting to return to bed - R lateral scoot w/c>EOB as above. Sit>supine with +2 total assist for trunk descent and L hemibody management into bed. Pt left supine in bed with needs in reach and bed alarm on.  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   Therapy/Group: Individual Therapy  Tawana Scale , PT, DPT, CSRS  10/26/2020, 2:37 PM

## 2020-10-27 LAB — GLUCOSE, CAPILLARY
Glucose-Capillary: 114 mg/dL — ABNORMAL HIGH (ref 70–99)
Glucose-Capillary: 148 mg/dL — ABNORMAL HIGH (ref 70–99)
Glucose-Capillary: 161 mg/dL — ABNORMAL HIGH (ref 70–99)
Glucose-Capillary: 144 mg/dL — ABNORMAL HIGH (ref 70–99)

## 2020-10-27 NOTE — Progress Notes (Signed)
Speech Language Pathology Weekly Progress and Session Note  Patient Details  Name: Lance [name] MRN: [id number] Date of Birth: [birth year]  Beginning of progress report period: 10/20/2020 End of progress report period: 10/27/2020  Today's Date: 10/27/2020    Short Term Goals: Week 1: SLP Short Term Goal 1 (Week 1): Patient will name at least 10 items in a category within 90 seconds with minA. SLP Short Term Goal 1 - Progress (Week 1): Not met SLP Short Term Goal 2 (Week 1): Patient will orient to day of week, date, year using visual aides (calendar, etc) with modA. SLP Short Term Goal 2 - Progress (Week 1): Met SLP Short Term Goal 3 (Week 1): Patient will complete basic level medication label reading interpreting/comprehension and pill box simulation with modA. SLP Short Term Goal 3 - Progress (Week 1): Not met SLP Short Term Goal 4 (Week 1): Patient will complete functional tasks and simulated tasks ADL's with modA cues for maintaining adequate selective attention. SLP Short Term Goal 4 - Progress (Week 1): Not met SLP Short Term Goal 5 (Week 1): Patient will answer basic level comprehension and recall questions based on 2-3 sentence text ready aloud by SLP, with 75% accuracy and minA. SLP Short Term Goal 5 - Progress (Week 1): Not met SLP Short Term Goal 6 (Week 1): Patient will verbally summarize after SLP read aloud of 3-4 sentence text, with 75% accuracy and modA.    New Short Term Goals: Week 2: SLP Short Term Goal 1 (Week 2): Patient will be able to name at least 5 items in a given category with maxA cues. SLP Short Term Goal 2 (Week 2): Patient will answer comprehension questions after reading short (1-2 sentence length) instructions, with 75% accuracy and modA. SLP Short Term Goal 3 (Week 2): Patient will complete functional tasks and simulated tasks ADL's with maxA cues for maintaining adequate sustained attention. SLP Short Term Goal 4 (Week 2): Patient will orient to time and  recall specific daily events related to therapeutic and nursing/medical interventions with modA cues.  Weekly Progress Updates:  Patient has made slow progress overall and met 1/4 STG's. Patient's lethargy with associated c/o mod-severe headache pain and reported poor sleep appears to be main cause of this. He is able to orient to time with assistance but is not able to adequately recall recent events without cues. He continues to only be able to name 1-3 words in a category even with extended time and cueing. Patient is not demonstrating adequate alertness, attention or active participation in simulated problem solving tasks. SLP plans to downgrade LTG"s at this time.   Intensity: Minumum of 1-2 x/day, 30 to 90 minutes Frequency: 3 to 5 out of 7 days Duration/Length of Stay: 6/10 Treatment/Interventions: Cognitive remediation/compensation;Medication managment;Cueing hierarchy;Patient/family education;Functional tasks;Speech/Language facilitation       Therapy/Group: Individual Therapy   Sonia Baller, MA, CCC-SLP Speech Therapy

## 2020-10-27 NOTE — Progress Notes (Signed)
Physical Therapy Session Note  Patient Details  Name: Lance [name] MRN: [id number] Date of Birth: 10-11-1945  Today's Date: 10/27/2020 PT Individual Time: 1300-1330 PT Individual Time Calculation (min): 30 min   Short Term Goals: Week 1:  PT Short Term Goal 1 (Week 1): Pt will perform rolling R/L in bed with max assist of 1 PT Short Term Goal 1 - Progress (Week 1): Progressing toward goal PT Short Term Goal 2 (Week 1): Pt will perform supine>sitting EOB with max assist of 2 PT Short Term Goal 2 - Progress (Week 1): Met PT Short Term Goal 3 (Week 1): Pt will perform sit>supine with mod assist of 2 PT Short Term Goal 3 - Progress (Week 1): Progressing toward goal PT Short Term Goal 4 (Week 1): Pt will perform bed<>chair transfer with +2 max assist using equipment as needed PT Short Term Goal 4 - Progress (Week 1): Progressing toward goal  Skilled Therapeutic Interventions/Progress Updates:   pt received in TIS [facility] and agreeable to therapy. Pt reported fatigue and requested to return to bed. Per notes, pt required x2 assist for safety and rehab tech brought into room to assist. Pt demonstrated heavy lean and poor trunk control to Lt and anteriorly. Pt directed in reaching toward Rt side for improved midline positioning and decreased LOB, pt required max A x2 for setup and transfer to bed with slide board and max A for maintaining balance at EOB, max A x2 sit>supine and max A x2 for upward scooting. Pt requested to call for meal ordering, PT assisting in this. Pt left in bed,  All needs in reach and in good condition. Call light in hand.  And alarm set.    Ambulation/gait training;Community reintegration;DME/adaptive equipment instruction;Neuromuscular re-education;Psychosocial support;Stair training;UE/LE Strength taining/ROM;Wheelchair propulsion/positioning;Balance/vestibular training;Discharge planning;Functional electrical stimulation;Pain management;Skin care/wound management;Therapeutic  Activities;UE/LE Coordination activities;Disease management/prevention;Cognitive remediation/compensation;Functional mobility training;Patient/family education;Splinting/orthotics;Therapeutic Exercise;Visual/perceptual remediation/compensation   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 Restrictions Weight Bearing Restrictions: No General:   Vital Signs: Therapy Vitals Temp: 98.4 F (36.9 C) Temp Source: Oral Pulse Rate: 72 Resp: 18 BP: 115/79 Patient Position (if appropriate): Lying Oxygen Therapy SpO2: 95 % O2 Device: Room Air Pain: Pain Assessment Pain Score: 2  Mobility:   Locomotion :    Trunk/Postural Assessment :    Balance:   Exercises:   Other Treatments:      Therapy/Group: Individual Therapy  Junie Panning 10/27/2020, 3:48 PM

## 2020-10-27 NOTE — Progress Notes (Signed)
PROGRESS NOTE   Subjective/Complaints:  No c/o s today, poor awareness of deficits, no HA, remains incont   ROS- neg CP, SOB, N/V/D,   Objective:   No results found. No results for input(s): WBC, HGB, HCT, PLT in the last 72 hours. No results for input(s): NA, K, CL, CO2, GLUCOSE, BUN, CREATININE, CALCIUM in the last 72 hours.  Intake/Output Summary (Last 24 hours) at 10/27/2020 0743 Last data filed at 10/26/2020 1845 Gross per 24 hour  Intake 480 ml  Output --  Net 480 ml        Physical Exam: Vital Signs Blood pressure 140/73, pulse 68, temperature 98.6 F (37 C), resp. rate 18, weight 77.9 kg, SpO2 100 %.  General: No acute distress Mood and affect are appropriate Heart: Regular rate and rhythm no rubs murmurs or extra sounds Lungs: Clear to auscultation, breathing unlabored, no rales or wheezes Abdomen: Positive bowel sounds, soft nontender to palpation, nondistended Extremities: No clubbing, cyanosis, or edema Skin: No evidence of breakdown, no evidence of rash  Neurologic: Cranial nerves II through XII intact, motor strength is 5/5 in right  deltoid, bicep, tricep, grip, hip flexor, knee extensors, ankle dorsiflexor and plantar flexor 3- L finger flexors and trace delt, no active movemnt LLE,  + LLE flexor withdrawal, MAS 2 in L knee flexors improved vs yesterday  Sensory exam normal sensation to light touch and proprioception in bilateral upper and lower extremities Cerebellar exam normal finger to nose to finger as well as heel to shin in bilateral upper and lower extremities Musculoskeletal: Full range of motion in all 4 extremities. No joint swelling, mild tenderness C spine , no trap pain to palp, reduced C spine ROM     Assessment/Plan: 1. Functional deficits which require 3+ hours per day of interdisciplinary therapy in a comprehensive inpatient rehab setting.  Physiatrist is providing close team  supervision and 24 hour management of active medical problems listed below.  Physiatrist and rehab team continue to assess barriers to discharge/monitor patient progress toward functional and medical goals  Care Tool:  Bathing    Body parts bathed by patient: Right arm,Left arm,Chest,Abdomen   Body parts bathed by helper: Front perineal area,Buttocks,Right arm,Right lower leg,Left lower leg Body parts n/a: Right arm,Front perineal area,Buttocks,Right lower leg,Left lower leg   Bathing assist Assist Level: Maximal Assistance - Patient 24 - 49% (sitting unsupported EOB)     Upper Body Dressing/Undressing Upper body dressing   What is the patient wearing?: Pull over shirt    Upper body assist Assist Level: Moderate Assistance - Patient 50 - 74%    Lower Body Dressing/Undressing Lower body dressing      What is the patient wearing?: Incontinence brief,Pants     Lower body assist Assist for lower body dressing: 2 Helpers     Toileting Toileting    Toileting assist Assist for toileting: Dependent - Patient 0%     Transfers Chair/bed transfer  Transfers assist  Chair/bed transfer activity did not occur: Safety/medical concerns  Chair/bed transfer assist level: 2 Helpers (+2 total assist) Chair/bed transfer assistive device: Sliding board   Locomotion Ambulation   Ambulation assist   Ambulation activity  did not occur: Safety/medical concerns          Walk 10 feet activity   Assist  Walk 10 feet activity did not occur: Safety/medical concerns        Walk 50 feet activity   Assist Walk 50 feet with 2 turns activity did not occur: Safety/medical concerns         Walk 150 feet activity   Assist Walk 150 feet activity did not occur: Safety/medical concerns         Walk 10 feet on uneven surface  activity   Assist Walk 10 feet on uneven surfaces activity did not occur: Safety/medical concerns         Wheelchair     Assist Will  patient use wheelchair at discharge?: Yes (TBD but likely) Type of Wheelchair: Manual           Wheelchair 50 feet with 2 turns activity    Assist    Wheelchair 50 feet with 2 turns activity did not occur: Safety/medical concerns       Wheelchair 150 feet activity     Assist  Wheelchair 150 feet activity did not occur: Safety/medical concerns       Blood pressure 140/73, pulse 68, temperature 98.6 F (37 C), resp. rate 18, weight 77.9 kg, SpO2 100 %.  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 Antithrombotics: -DVT/anticoagulation:SCDs -antiplatelet therapy: N/A 3. Pain management:Tylenol as needed  5/16: topamax 25mg  started at night for headache/insomnia. Lidocaine patch added for coccyx pain 4. Mood/schizophrenia:Provide emotional support- was on Zyprexa 5 mg at home- will resume QHS which should help with sleep  -antipsychotic agents: resume Zyprexa 5. Neuropsych: This patientiscapable of making decisions on hisown behalf. 6. Skin/Wound Care:Routine skin checks 7. Fluids/Electrolytes/Nutrition:Routine in and outs with follow-up chemistries 8. Hypertension. Continue Norvasc 10mg  daily Vitals:   10/26/20 2034 10/27/20 0558  BP: 129/73 140/73  Pulse: 70 68  Resp: 16 18  Temp: 98.1 F (36.7 C) 98.6 F (37 C)  SpO2: 100% 100%  Goal <809 mmHg systolic , started 10mg  of amlodipine on 5/13, overall improved cont to monitor   CBG (last 3)  Recent Labs    10/26/20 1626 10/26/20 2041 10/27/20 0558   GLUCAP 108* 93 114*  Monitor on current doses, pm elevation , increase novalog at mealtimes , increase novalog to 10U with meals well controlled monitor for hypoglycemia  Cont Levemir  10. History of alcohol polysubstance use. Urine drug screen positive cocaine. Provide counseling 11. History of hepatitis C. Follow-up outpatient 12. L foot drop- suggest possible AFO, etc and PRAFO to reduce risk of L ankle contracture 13. L inattention- per primary team  14.   Hx of schizophrenia resume Zyprexa, 2.5mg  rather than 5mg , pt states he slept well, groggy this am will monitor  LOS: 8 days A FACE TO [place] E Hilma Steinhilber 10/27/2020, 7:43 AM

## 2020-10-27 NOTE — Consult Note (Signed)
Neuropsychological Consultation   Patient:   Lance [name]   DOB:   May 28, 1946  MR Number:  [id number]  Location:  [facility] [id number] [place] [address] Dept: [place]: [phone]           Date of Service:   10/27/2020  Start Time:   8 AM End Time:   9 AM  Provider/Observer:  Ilean Skill, Psy.D.       Clinical Neuropsychologist       Billing Code/Service: [phone]  Chief Complaint:    Lance [name] is a 75 year old male with history of diabetes, hepatitis C and schizophrenia as well as alcohol and polysubstance use.  Patient presented on 10/14/2020 with weakness over the prior 2 weeks especially left lower extremity with gait disorder.  CT head showed 3.4 x 1.7 x 2.8 acute right frontal parietal bleed.  MRI follow-up showed stable intra-axial hemorrhage in the right superior frontal gyrus motor strip.  Stable right anterior frontal convexity en plaque meningioma since December with comparison of prior imaging.  Small bilateral subdural hematomas resolved since earlier tracings in December.  Prior MRIs have been concerning for possible amyloid angiopathy.  Reason for Service:  Patient was referred for neuropsychological consultation due to coping and adjustment issues and assess his current psychiatric status.  Below is the HPI for the current admission.  Lance [name] is a 75 year old right-handed male with history of diabetes mellitus, hepatitis C and schizophrenia as well as alcohol and polysubstance use. Per chart review patient lives alone. 1 level home 3 steps to entry. Used a Rollator for the past few weeks due to gait dysfunction. Presented 10/14/2020 with weakness x2 weeks especially left lower extremity with gait disorder. CT of the head showed a 3.4 x 1.7 x 2.8 acute right frontal parietal parenchymal bleed. CT cervical spine negative. MRI follow-up showed  stable intra-axial hemorrhage in the right superior frontal gyrus motor strip. Stable right anterior frontal convexity en plaque meningioma since December with comparison of prior imaging. Small bilateral subdural hematomas resolved since earlier tracings of December. Prior MRIs were also suggestive of chronic microangiopathy and mild hemorrhages concerning for possible amyloid angiopathy. Echocardiogram with ejection fraction of 70 to 92% grade 1 diastolic dysfunction. Admission chemistries unremarkable except glucose 167 alcohol negative urine drug screen positive cocaine. Neurology follow-up conservative care close monitoring of blood pressure. Tolerating a regular consistency diet. Due to patient's decreased functional ability was admitted for a comprehensive rehab program.  Current Status:  Patient was awake and alert reporting stable mood state.  Patient denied any exacerbation of his underlying schizophrenia condition and reports that he has been stable for some time.  Patient aware and oriented with significant left-sided motor deficits.  Patient knew he had had a stroke/bleed but was not able to give a lot of details regarding what is going on over the past year especially back in December.  Patient denied any auditory or visual hallucination and describes stable mood state.  Behavioral Observation: Lance [name]  presents as a 75 y.o.-year-old Right African American Male who appeared his stated age. his dress was Appropriate and he was Well Groomed and his manners were Appropriate to the situation.  his participation was indicative of Appropriate and Redirectable behaviors.  There were physical disabilities noted.  he displayed an appropriate level of cooperation and motivation.     Interactions:    Active Appropriate and Redirectable  Attention:   abnormal and attention span appeared shorter than expected for age  Memory:   within normal limits; recent and remote memory  intact  Visuo-spatial:  not examined  Speech (Volume):  low  Speech:   normal; normal  Thought Process:  Coherent and Relevant  Though Content:  WNL; not suicidal and not homicidal  Orientation:   person, place and time/date  Judgment:   Fair  Planning:   Fair  Affect:    Appropriate  Mood:    Dysphoric  Insight:   Good  Intelligence:   normal  Substance Use:  There is a documented history of alcohol and polysubstance abuse confirmed by the patient.    Medical History:   Past Medical History:  Diagnosis Date  . Diabetes mellitus without complication ([place])   . Hepatitis C   . Schizophrenia [facility])          Patient Active Problem List   Diagnosis Date Noted  . Intraparenchymal hemorrhage of brain ([place]) 10/19/2020  . Left hemiplegia ([place]) 10/19/2020  . ICH (intracerebral hemorrhage) ([place]) 10/14/2020  . Weakness   . Acute metabolic encephalopathy   . Silent micro-hemorrhage of brain ([place])   . Functional assessment declined   . Malnutrition of moderate degree 05/26/2020  . Subacute delirium 05/16/2020  . Syncope 05/15/2020  . Sepsis ([place]) 05/14/2020  . Leg pain 12/07/2018  . PAD (peripheral artery disease) ([place]) 12/07/2018  . Acute upper GI bleed 10/10/2016  . Hypotension   . Polysubstance abuse ([place]) 03/10/2015  . Mass of arm 03/10/2015  . 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 03/10/2015  . Perianal abscess 08/31/2014  . 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 Adenomatous polyp of colon 08/30/2013  . Type 2 diabetes mellitus with hyperglycemia, without long-term current use of insulin ([place]) 08/26/2013  . Hepatitis C virus infection without hepatic coma 03/21/2013  . Need for hepatitis B vaccination 03/21/2013  . Depression 10/23/2012  . IVDU (intravenous drug user) 10/23/2012  . Schizophrenia ([place]) 10/23/2012  . Carpal tunnel syndrome on both sides 07/31/2012  . Diabetic peripheral neuropathy ([place]) 07/31/2012  . Osteoarthritis of both hips 07/31/2012  .  Esophageal reflux 10/21/2009  . Diverticulitis of colon 10/21/2009    Psychiatric History:  Patient with prior history of diagnosis of schizophrenia and has continued with his treatment regimen including Zyprexa and Celexa.  Family Med/Psych History:  Family History  Problem Relation Age of Onset  . Diabetes Sister    Impression/DX:  Lance [name] is a 75 year old male with history of diabetes, hepatitis C and schizophrenia as well as alcohol and polysubstance use.  Patient presented on 10/14/2020 with weakness over the prior 2 weeks especially left lower extremity with gait disorder.  CT head showed 3.4 x 1.7 x 2.8 acute right frontal parietal bleed.  MRI follow-up showed stable intra-axial hemorrhage in the right superior frontal gyrus motor strip.  Stable right anterior frontal convexity en plaque meningioma since December with comparison of prior imaging.  Small bilateral subdural hematomas resolved since earlier tracings in December.  Prior MRIs have been concerning for possible amyloid angiopathy.  Patient was awake and alert reporting stable mood state.  Patient denied any exacerbation of his underlying schizophrenia condition and reports that he has been stable for some time.  Patient aware and oriented with significant left-sided motor deficits.  Patient knew he had had a stroke/bleed but was not able to give a lot of details regarding what is going on over the past year  especially back in December.  Patient denied any auditory or visual hallucination and describes stable mood state.  Disposition/Plan:  .  Coping and adjustment issues.  Patient appears to be managing fairly well from my mood/psychiatric standpoint.  Patient has been making very slow progress in therapies but reports that he is understanding the purpose and goals and is managing fairly well with extended hospital stay.  Diagnosis:    Intraparenchymal hemorrhage of brain [facility]) - Plan: Ambulatory referral to Neurology          Electronically Signed   _______________________ Ilean Skill, Psy.D. Clinical Neuropsychologist

## 2020-10-27 NOTE — Progress Notes (Signed)
Physical Therapy Weekly Progress Note  Patient Details  Name: Lance [name] MRN: [id number] Date of Birth: 06-17-1945  Beginning of progress report period: Oct 20, 2020 End of progress report period: Oct 27, 2020  Today's Date: 10/27/2020 PT Individual Time: 1000-1113 PT Individual Time Calculation (min): 73 min   Patient has met 1 of 4 short term goals.  Patient is making slow progress toward his goals. He remains severely limited by poor insight into his deficits, profound pushing to the L, poor midline orientation, decreased L LE strength/coordination, L LE flexor tone and flexor withdrawal. He requires grossly MaxA x2 for all functional mobility and visual feedback + consistent multimodal cues to maintain static sitting balance midline.   Patient continues to demonstrate the following deficits muscle weakness, decreased cardiorespiratoy endurance, decreased midline orientation and decreased motor planning, decreased initiation, decreased attention, decreased awareness, decreased problem solving, decreased safety awareness, decreased memory and delayed processing and decreased sitting balance, decreased standing balance, decreased postural control, hemiplegia and decreased balance strategies and therefore will continue to benefit from skilled PT intervention to increase functional independence with mobility.  Patient progressing toward long term goals..  Continue plan of care.  PT Short Term Goals Week 1:  PT Short Term Goal 1 (Week 1): Pt will perform rolling R/L in bed with max assist of 1 PT Short Term Goal 1 - Progress (Week 1): Progressing toward goal PT Short Term Goal 2 (Week 1): Pt will perform supine>sitting EOB with max assist of 2 PT Short Term Goal 2 - Progress (Week 1): Met PT Short Term Goal 3 (Week 1): Pt will perform sit>supine with mod assist of 2 PT Short Term Goal 3 - Progress (Week 1): Progressing toward goal PT Short Term Goal 4 (Week 1): Pt will perform bed<>chair  transfer with +2 max assist using equipment as needed PT Short Term Goal 4 - Progress (Week 1): Progressing toward goal Week 2:  PT Short Term Goal 1 (Week 2): Pt will perform rolling R/L in bed with max assist of 1 PT Short Term Goal 2 (Week 2): Pt will perform sit>supine with mod assist of 2 PT Short Term Goal 3 (Week 2): Pt will perform bed<>chair transfer with +2 max assist using equipment as needed PT Short Term Goal 4 (Week 2): Patient will complete sit <> stand with LRAD and MaxA x2 consistently PT Short Term Goal 5 (Week 2): Patient will maintain static sitting balance at midline with no more than ModA for at least 30s  Skilled Therapeutic Interventions/Progress Updates:    Patient received supine in bed, asleep but easy to wake, agreeable to PT. He denied pain/HA, but endorsed neck "stiffness." Patient requesting to use urinal and was able to place urinal with set up assist for brief management. Donning pants at bedlevel with TotalA. MaxA/ TotalA to roll due to poor initiation/motor planning. Once L side lying, PT attempting to have patient push up on bedrail/bed to come to sit up, assisting at L shoulder blade. Patient reported too much neck pain from PTs hand positioning and requested that PT not assist there. PT then attempting to facilitate sidleying > sitting by applying pressure to R thigh/hip. Patient reporting that this caused neck pain as well. Patient unable to complete this transition without significant assist. Ultimately, patient required up to [place] x2 to come to sit and verbal cues to reach R UE toward foot of bed. With R UE holding onto foot of bed, patient able to maintain static sitting  balance with slight L lateral lean for ~30s. Slideboard transfer TotalAx2 to wc. PT transporting patient in wc to therapy gym. Static/dynamic sitting balance completed in wc with mirror for visual feedback. Patient able to achieve midline with BUE in his lap and lean forward, but upon returning to  resting position against chair, patient would develop strong L lateral lean despite no R UE support on chair. With consistent repetition and max cuing, patient able to complete A/P weight shift maintaining midline x5. Patient reporting need to recline back due to neck pain. 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 Patient reporting that these exercises are "annoying-" PT reiterating that patient needs to be able to sit midline before he can stand, walk, etc. Patient verbalizing understanding. Sit <> stand x3 in [employer] . Multimodal cues to reach R UE outside BOS to prevent pushing with R UE and facilitate further midline orientation. Clarise Cruz lift facilitating L knee extension, however, with prolonged standing, L LE began to move midline and flex more. Patient able to engage glutes to facilitate upright posture/pelvic neutral. Mirror used for visual feedback on posture. Patient noted to be able to attend to mirror for longer periods of time during this session as well. Patient returning to room in wc, seatbelt alarm on, call light within reach.    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 Restrictions Weight Bearing Restrictions: No   Therapy/Group: Individual Therapy  Debbora Dus 10/27/2020, 7:44 AM

## 2020-10-27 NOTE — Progress Notes (Signed)
Occupational Therapy Session Note  Patient Details  Name: Lance [name] MRN: [id number] Date of Birth: [birth year]  Today's Date: 10/27/2020 OT Individual Time: [phone] and [id number] OT Individual Time Calculation (min): 29 min and 61 min  Short Term Goals: Week 2:  OT Short Term Goal 1 (Week 2): Pt will complete UB bathing with min assist in support sitting for two consecutive sessions. OT Short Term Goal 2 (Week 2): Pt will maintain static sitting balance for 5 mins with no more than mod assist in preparation for selfcare tasks. OT Short Term Goal 3 (Week 2): Pt will use the LUE with bathing tasks with no more than min assist to wash the RUE. OT Short Term Goal 4 (Week 2): Pt will complete toilet transfer to the drop arm commode with max assist squat pivot.    Skilled Therapeutic Interventions/Progress Updates:    Pt greeted in bed with some c/o neck pain. RN notified and provided pain medicine. OT also provided pt with MHP and supported cervical area during exercises, also guided him through gentle cervical stretches during rest breaks. To work on core strengthening, pt completed supine anterior reaches to target outside of base of support and across midline using the unaffected Rt UE. Rest breaks provided as needed and utilized therapeutically as stated above. He remained in bed at close of session, all needs within reach and bed alarm set. OT provided him with some aromatherapy to promote relaxation/decreased muscular tension.   2nd Session 1:1 tx (61 min) Pt greeted in bed, agreeable to engage in bathing/dressing tasks during tx. Tried to sit EOB for UB self care, however pt was Total A for sitting balance due to pushing tendencies. No +2 assist during session. Therefore UB/LB self care completed bedlevel with pt sitting in chair position to work on trunk control. Hand over hand to incorporate the affected Lt UE functionally, pt with some proximal + distal activation. Reclined figure 4  position with assist and pt was able to help with washing both feet. Facilitated Lt LE weightbearing by bridging during dressing tasks. Rolling Rt>Lt completed with Mod-Max A. Pt with continent BM using bedpan. Note that pt was asked if he needed to void before pericare was completed, stated no, and then after brief was donned stated he had to void. Pt unable to hold bladder until OT retrieved his urinal, voided in brief. Was able to hold BM until bedpan was placed. Worked on neutral trunk alignment throughout session as pt tended to rotate his trunk towards the Lt side. At end of session pt remained in bed, left with all needs within reach and bed alarm set.   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 Restrictions Weight Bearing Restrictions: No Pain: Pain Assessment Pain Score: 3  ADL: ADL Eating: Supervision/safety Where Assessed-Eating: Bed level Grooming: Minimal assistance Where Assessed-Grooming: Bed level Upper Body Bathing: Maximal assistance Where Assessed-Upper Body Bathing: Edge of bed Lower Body Bathing: Other (comment) (total +2) Where Assessed-Lower Body Bathing: Edge of bed Upper Body Dressing: Other (Comment) (total assist +2) Where Assessed-Upper Body Dressing: Edge of bed Lower Body Dressing: Other (Comment) (total +2) Where Assessed-Lower Body Dressing: Edge of bed Toileting: Other (Comment) (total +2) Toilet Transfer: Other (comment) (total +2) Toilet Transfer Method: Squat pivot Toilet Transfer Equipment: Drop arm bedside commode      Therapy/Group: Individual Therapy  Lance [name] A Lance [name] 10/27/2020, 12:45 PM

## 2020-10-27 NOTE — Plan of Care (Signed)
  Problem: RH Expression Communication Goal: LTG Patient will verbally express basic/complex needs(SLP) Description: LTG:  Patient will verbally express basic/complex needs, wants or ideas with cues  (SLP) Flowsheets (Taken 10/27/2020 0948) LTG: Patient will verbally express basic/complex needs, wants or ideas (SLP): Minimal Assistance - Patient > 75%   Problem: RH Expression Communication Goal: LTG Patient will increase word finding of common (SLP) Description: LTG:  Patient will increase word finding of common objects/daily info/abstract thoughts with cues using compensatory strategies (SLP). Flowsheets (Taken 10/27/2020 [phone]) LTG: Patient will increase word finding of common (SLP): Minimal Assistance - Patient > 75%   Problem: RH Memory Goal: LTG Patient will use memory compensatory aids to (SLP) Description: LTG:  Patient will use memory compensatory aids to recall biographical/new, daily complex information with cues (SLP) Flowsheets (Taken 10/27/2020 0948) LTG: Patient will use memory compensatory aids to (SLP): Minimal Assistance - Patient > 75%   Problem: RH Awareness Goal: LTG: Patient will demonstrate awareness during functional activites type of (SLP) Description: LTG: Patient will demonstrate awareness during functional activites type of (SLP) Flowsheets (Taken 10/27/2020 0948) LTG: Patient will demonstrate awareness during cognitive/linguistic activities with assistance of (SLP): Minimal Assistance - Patient > 75%   Sonia Baller, MA, CCC-SLP Speech Therapy [facility]

## 2020-10-28 DIAGNOSIS — I10 Essential (primary) hypertension: Secondary | ICD-10-CM

## 2020-10-28 DIAGNOSIS — G8110 Spastic hemiplegia affecting unspecified side: Secondary | ICD-10-CM

## 2020-10-28 DIAGNOSIS — E1165 Type 2 diabetes mellitus with hyperglycemia: Secondary | ICD-10-CM

## 2020-10-28 DIAGNOSIS — G441 Vascular headache, not elsewhere classified: Secondary | ICD-10-CM

## 2020-10-28 LAB — GLUCOSE, CAPILLARY
Glucose-Capillary: 337 mg/dL — ABNORMAL HIGH (ref 70–99)
Glucose-Capillary: 210 mg/dL — ABNORMAL HIGH (ref 70–99)
Glucose-Capillary: 69 mg/dL — ABNORMAL LOW (ref 70–99)
Glucose-Capillary: 168 mg/dL — ABNORMAL HIGH (ref 70–99)
Glucose-Capillary: 313 mg/dL — ABNORMAL HIGH (ref 70–99)

## 2020-10-28 NOTE — Progress Notes (Signed)
Orthopedic Tech Progress Note Patient Details:  Lance [name] Oct 26, 1945 [id number] Applied a HANGER PRAFO BOOT to patient Patient ID: Lance [name], male   DOB: 1945-07-05, 75 y.o.   MRN: [id number]   Lance [name] 10/28/2020, 3:38 PM

## 2020-10-28 NOTE — Progress Notes (Signed)
Occupational Therapy Session Note  Patient Details  Name: Lance [name] MRN: [id number] Date of Birth: [birth year]  Today's Date: 10/28/2020 OT Individual Time: 1101-1159 OT Individual Time Calculation (min): 58 min   Short Term Goals: Week 2:  OT Short Term Goal 1 (Week 2): Pt will complete UB bathing with min assist in support sitting for two consecutive sessions. OT Short Term Goal 2 (Week 2): Pt will maintain static sitting balance for 5 mins with no more than mod assist in preparation for selfcare tasks. OT Short Term Goal 3 (Week 2): Pt will use the LUE with bathing tasks with no more than min assist to wash the RUE. OT Short Term Goal 4 (Week 2): Pt will complete toilet transfer to the drop arm commode with max assist squat pivot.  Skilled Therapeutic Interventions/Progress Updates:    Pt greeted in bed, stating that his neck pain was manageable without additional interventions. Once Teds + gripper socks were donned, supine<sit completed with +2 for safety due to pts pusher tendencies. Pt did well initially with his sitting balance with cues to reach out with the Rt arm but while Sara+ was being set up, pt required Max A for balance due to increase in Lt pushing. +2 assist for Sara+ transfer to the [facility]. Brought in a mirror and worked on midline awareness/balance while sitting to void. Pt did well with manual, visual, and verbal cues for short windows of time without overt LOBs or pushing episodes. Pt ultimately unable to void though attempted to do so for a majority of the session. He kept commenting how he liked the Sara+ and the [facility], stating the [facility] was "better than the tub." Sara+ of 2 assist for transfer back to bed, pt rolling Rt>Lt for pericare/brief change. Hemi dressing strategies for donning pants with pt bridging given assistance for Lt LE placement to promote weightbearing. +2 for boosting up in bed. He remained comfortably in bed at close of session, all needs within reach and bed  alarm set. Tx focus placed on ADL retraining, midline awareness, sitting balance, and Lt NMR.     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 Restrictions Weight Bearing Restrictions: No ADL: ADL Eating: Supervision/safety Where Assessed-Eating: Bed level Grooming: Minimal assistance Where Assessed-Grooming: Bed level Upper Body Bathing: Maximal assistance Where Assessed-Upper Body Bathing: Edge of bed Lower Body Bathing: Other (comment) (total +2) Where Assessed-Lower Body Bathing: Edge of bed Upper Body Dressing: Other (Comment) (total assist +2) Where Assessed-Upper Body Dressing: Edge of bed Lower Body Dressing: Other (Comment) (total +2) Where Assessed-Lower Body Dressing: Edge of bed Toileting: Other (Comment) (total +2) Toilet Transfer: Other (comment) (total +2) Toilet Transfer Method: Squat pivot Toilet Transfer Equipment: Drop arm bedside commode :     Therapy/Group: Individual Therapy  Skeet Simmer 10/28/2020, 12:37 PM

## 2020-10-28 NOTE — Progress Notes (Signed)
PROGRESS NOTE   Subjective/Complaints: Patient seen sitting up in bed this morning.  He states he slept well overnight.  He denies complaints.  ROS: Denies CP, SOB, N/V/D  Objective:   No results found. No results for input(s): WBC, HGB, HCT, PLT in the last 72 hours. No results for input(s): NA, K, CL, CO2, GLUCOSE, BUN, CREATININE, CALCIUM in the last 72 hours.  Intake/Output Summary (Last 24 hours) at 10/28/2020 1332 Last data filed at 10/28/2020 0731 Gross per 24 hour  Intake 1200 ml  Output --  Net 1200 ml        Physical Exam: Vital Signs Blood pressure (!) 150/82, pulse 64, temperature 98.3 F (36.8 C), temperature source Oral, resp. rate 16, weight 77.9 kg, SpO2 100 %. Constitutional: No distress . Vital signs reviewed. HENT: Normocephalic.  Atraumatic. Eyes: EOMI. No discharge. Cardiovascular: No JVD.  RRR. Respiratory: Normal effort.  No stridor.  Bilateral clear to auscultation. GI: Non-distended.  BS +. Skin: Warm and dry.  Intact. Psych: Normal mood.  Normal behavior. Musc: No edema in extremities.  No tenderness in extremities. Neuro: Alert Motor: RUE/RLE: 5/5 proximal distal LUE: RUE -/5 proximal distal LLE: 0/5 proximal and distal  Assessment/Plan: 1. Functional deficits which require 3+ hours per day of interdisciplinary therapy in a comprehensive inpatient rehab setting.  Physiatrist is providing close team supervision and 24 hour management of active medical problems listed below.  Physiatrist and rehab team continue to assess barriers to discharge/monitor patient progress toward functional and medical goals  Care Tool:  Bathing    Body parts bathed by patient: Chest,Abdomen,Right upper leg,Left upper leg   Body parts bathed by helper: Front perineal area,Buttocks,Right arm,Left lower leg,Left arm,Right lower leg Body parts n/a: Right arm,Front perineal area,Buttocks,Right lower leg,Left  lower leg   Bathing assist Assist Level: Maximal Assistance - Patient 24 - 49%     Upper Body Dressing/Undressing Upper body dressing   What is the patient wearing?: Pull over shirt    Upper body assist Assist Level: Maximal Assistance - Patient 25 - 49%    Lower Body Dressing/Undressing Lower body dressing      What is the patient wearing?: Incontinence brief,Pants     Lower body assist Assist for lower body dressing: Dependent - Patient 0%     Toileting Toileting    Toileting assist Assist for toileting: Dependent - Patient 0% (using bedpan)     Transfers Chair/bed transfer  Transfers assist  Chair/bed transfer activity did not occur: Safety/medical concerns  Chair/bed transfer assist level: 2 Helpers Chair/bed transfer assistive device: Sliding board   Locomotion Ambulation   Ambulation assist   Ambulation activity did not occur: Safety/medical concerns          Walk 10 feet activity   Assist  Walk 10 feet activity did not occur: Safety/medical concerns        Walk 50 feet activity   Assist Walk 50 feet with 2 turns activity did not occur: Safety/medical concerns         Walk 150 feet activity   Assist Walk 150 feet activity did not occur: Safety/medical concerns  Walk 10 feet on uneven surface  activity   Assist Walk 10 feet on uneven surfaces activity did not occur: Safety/medical concerns         Wheelchair     Assist Will patient use wheelchair at discharge?: Yes (TBD but likely) Type of Wheelchair: Manual           Wheelchair 50 feet with 2 turns activity    Assist    Wheelchair 50 feet with 2 turns activity did not occur: Safety/medical concerns       Wheelchair 150 feet activity     Assist  Wheelchair 150 feet activity did not occur: Safety/medical concerns       Blood pressure (!) 150/82, pulse 64, temperature 98.3 F (36.8 C), temperature source Oral, resp. rate 16, weight 77.9  kg, SpO2 100 %.  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 Antithrombotics: -DVT/anticoagulation:SCDs -antiplatelet therapy: N/A 3. Pain management:Tylenol as needed  5/16: topamax 25mg  started at night for headache/insomnia. Lidocaine patch added for coccyx pain  Controlled with meds on 5/21 4. Mood/schizophrenia:Provide emotional support- was on Zyprexa 5 mg at home- will resume QHS which should help with sleep  -antipsychotic agents: resume Zyprexa 5. Neuropsych: This patientiscapable of making decisions on hisown behalf. 6. Skin/Wound Care:Routine skin checks 7. Fluids/Electrolytes/Nutrition:Routine in and outs with follow-up chemistries 8. Hypertension. Continue Norvasc 10mg  daily Vitals:   10/28/20 0554 10/28/20 0559  BP: (!) 169/85 (!) 150/82  Pulse: 68 64  Resp: 16   Temp: 98.3 F (36.8 C)   SpO2: 100%    Controlled for the most part, elevated this a.m., continue to monitor for trend 9.  Diabetes mellitus type 2 with hyperglycemia CBG (last 3)  Recent Labs    10/28/20 0621 10/28/20 1131 10/28/20 1223  GLUCAP 337* 69* 210*   Increased novalog to 10U with meals   Continue Levemir  Extremely labile on 5/21, monitor for trend 10. History of alcohol polysubstance use. Urine drug screen positive cocaine. Provide counseling 11. History of hepatitis C. Follow-up outpatient 12. L foot drop- suggest possible AFO, etc and PRAFO to reduce risk of L ankle contracture 13.   Hx of schizophrenia resume Zyprexa, 2.5mg  rather than 5mg , pt states he slept well, groggy this am  will monitor   LOS: 9 days A FACE TO FACE EVALUATION WAS PERFORMED  Dauna Ziska Lorie Phenix 10/28/2020, 1:32 PM

## 2020-10-29 LAB — GLUCOSE, CAPILLARY
Glucose-Capillary: 136 mg/dL — ABNORMAL HIGH (ref 70–99)
Glucose-Capillary: 95 mg/dL (ref 70–99)
Glucose-Capillary: 129 mg/dL — ABNORMAL HIGH (ref 70–99)
Glucose-Capillary: 261 mg/dL — ABNORMAL HIGH (ref 70–99)

## 2020-10-29 NOTE — Progress Notes (Signed)
Speech Language Pathology Daily Session Note  Patient Details  Name: Lance [name] MRN: [id number] Date of Birth: [birth year]  Today's Date: 10/29/2020 SLP Individual Time: 1015-1045 SLP Individual Time Calculation (min): 30 min  Short Term Goals: Week 2: SLP Short Term Goal 1 (Week 2): Patient will be able to name at least 5 items in a given category with maxA cues. SLP Short Term Goal 2 (Week 2): Patient will answer comprehension questions after reading short (1-2 sentence length) instructions, with 75% accuracy and modA. SLP Short Term Goal 3 (Week 2): Patient will complete functional tasks and simulated tasks ADL's with maxA cues for maintaining adequate sustained attention. SLP Short Term Goal 4 (Week 2): Patient will orient to time and recall specific daily events related to therapeutic and nursing/medical interventions with modA cues.  Skilled Therapeutic Interventions:  Pt was seen for skilled ST targeting cognitive goals.  Pt was sleeping upon therapist's arrival but awakened easily to voice and was agreeable to participating in treatment.  Pt was oriented to day of the week independently upon waking.  During functional conversations pt needed mod verbal cues for redirection to topic given decreased topic maintenance.  He often would perseverate on expressing his appreciation for the care has received since being hospitalized.  Pt reported complaints of neck pain and stated that he wanted Tylenol and was able to use the call bell to convey needs to nursing with min assist verbal cues.  Pt was left in bed with bed alarm set and call bell within reach.  Continue per current plan of care.    Pain Pain Assessment Pain Scale: 0-10 Pain Score: 7  Pain Type: Acute pain Pain Location: Neck Pain Descriptors / Indicators: Aching Pain Intervention(s): Heat applied;RN made aware  Therapy/Group: Individual Therapy  Natavia Sublette, Selinda Orion 10/29/2020, 12:56 PM

## 2020-10-30 LAB — GLUCOSE, CAPILLARY
Glucose-Capillary: 146 mg/dL — ABNORMAL HIGH (ref 70–99)
Glucose-Capillary: 127 mg/dL — ABNORMAL HIGH (ref 70–99)
Glucose-Capillary: 222 mg/dL — ABNORMAL HIGH (ref 70–99)
Glucose-Capillary: 176 mg/dL — ABNORMAL HIGH (ref 70–99)

## 2020-10-30 NOTE — Progress Notes (Signed)
Occupational Therapy Session Note  Patient Details  Name: Lance [name] MRN: [id number] Date of Birth: 05/09/1946  Today's Date: 10/30/2020 OT Individual Time: 4193-7902 OT Individual Time Calculation (min): 26 min    Short Term Goals: Week 2:  OT Short Term Goal 1 (Week 2): Pt will complete UB bathing with min assist in support sitting for two consecutive sessions. OT Short Term Goal 2 (Week 2): Pt will maintain static sitting balance for 5 mins with no more than mod assist in preparation for selfcare tasks. OT Short Term Goal 3 (Week 2): Pt will use the LUE with bathing tasks with no more than min assist to wash the RUE. OT Short Term Goal 4 (Week 2): Pt will complete toilet transfer to the drop arm commode with max assist squat pivot.   Skilled Therapeutic Interventions/Progress Updates:    Pt greeted at time of session supine in bed resting agreeable to OT session no pain. Pt received phone call at beginning of session, given a few minutes to talk with family members on phone. Supine > sit Max A and sitting EOB to eat crackers/snack with adaptive technique to open wrapper. Note Max/total A for repositioning sitting EOB for feet on floor and adjusting back to midline, at times Min A up to Max A for sitting balance. Note improved when using LUE propped back in external rotation and support on bed surface. Pt hyperverbal throughout. Sit > supine Max A. Alarm on call bell in reach.   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

## 2020-10-30 NOTE — Progress Notes (Signed)
PROGRESS NOTE   Subjective/Complaints:  No issues overnight   ROS: Denies CP, SOB, N/V/D  Objective:   No results found. No results for input(s): WBC, HGB, HCT, PLT in the last 72 hours. No results for input(s): NA, K, CL, CO2, GLUCOSE, BUN, CREATININE, CALCIUM in the last 72 hours.  Intake/Output Summary (Last 24 hours) at 10/30/2020 0856 Last data filed at 10/30/2020 0518 Gross per 24 hour  Intake 720 ml  Output 250 ml  Net 470 ml        Physical Exam: Vital Signs Blood pressure (!) 158/86, pulse 73, temperature 98.2 F (36.8 C), temperature source Oral, resp. rate 18, weight 77.9 kg, SpO2 100 %.  General: No acute distress Mood and affect are appropriate Heart: Regular rate and rhythm no rubs murmurs or extra sounds Lungs: Clear to auscultation, breathing unlabored, no rales or wheezes Abdomen: Positive bowel sounds, soft nontender to palpation, nondistended Extremities: No clubbing, cyanosis, or edema Skin: No evidence of breakdown, no evidence of rash  Neuro: Alert Motor: RUE/RLE: 5/5 proximal distal LUE: RUE -/5 proximal distal LLE: 0/5 proximal and distal  Assessment/Plan: 1. Functional deficits which require 3+ hours per day of interdisciplinary therapy in a comprehensive inpatient rehab setting.  Physiatrist is providing close team supervision and 24 hour management of active medical problems listed below.  Physiatrist and rehab team continue to assess barriers to discharge/monitor patient progress toward functional and medical goals  Care Tool:  Bathing    Body parts bathed by patient: Chest,Abdomen,Right upper leg,Left upper leg   Body parts bathed by helper: Front perineal area,Buttocks,Right arm,Left lower leg,Left arm,Right lower leg Body parts n/a: Right arm,Front perineal area,Buttocks,Right lower leg,Left lower leg   Bathing assist Assist Level: Maximal Assistance - Patient 24 - 49%      Upper Body Dressing/Undressing Upper body dressing   What is the patient wearing?: Pull over shirt    Upper body assist Assist Level: Maximal Assistance - Patient 25 - 49%    Lower Body Dressing/Undressing Lower body dressing      What is the patient wearing?: Incontinence brief,Pants     Lower body assist Assist for lower body dressing: Dependent - Patient 0%     Toileting Toileting    Toileting assist Assist for toileting: Dependent - Patient 0% (using bedpan)     Transfers Chair/bed transfer  Transfers assist  Chair/bed transfer activity did not occur: Safety/medical concerns  Chair/bed transfer assist level: 2 Helpers Chair/bed transfer assistive device: Sliding board   Locomotion Ambulation   Ambulation assist   Ambulation activity did not occur: Safety/medical concerns          Walk 10 feet activity   Assist  Walk 10 feet activity did not occur: Safety/medical concerns        Walk 50 feet activity   Assist Walk 50 feet with 2 turns activity did not occur: Safety/medical concerns         Walk 150 feet activity   Assist Walk 150 feet activity did not occur: Safety/medical concerns         Walk 10 feet on uneven surface  activity   Assist Walk 10 feet on  uneven surfaces activity did not occur: Safety/medical concerns         Wheelchair     Assist Will patient use wheelchair at discharge?: Yes (TBD but likely) Type of Wheelchair: Manual           Wheelchair 50 feet with 2 turns activity    Assist    Wheelchair 50 feet with 2 turns activity did not occur: Safety/medical concerns       Wheelchair 150 feet activity     Assist  Wheelchair 150 feet activity did not occur: Safety/medical concerns       Blood pressure (!) 158/86, pulse 73, temperature 98.2 F (36.8 C), temperature source Oral, resp. rate 18, weight 77.9 kg, SpO2 100 %.  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 Antithrombotics: -DVT/anticoagulation:SCDs -antiplatelet therapy: N/A 3. Pain management:Tylenol as needed  5/16: topamax 25mg  started at night for headache/insomnia. Lidocaine patch added for coccyx pain  No c/os  4. Mood/schizophrenia:Provide emotional support- was on Zyprexa 5 mg at home- will resume QHS which should help with sleep  -antipsychotic agents: resume Zyprexa 5. Neuropsych: This patientiscapable of making decisions on hisown behalf. 6. Skin/Wound Care:Routine skin checks 7. Fluids/Electrolytes/Nutrition:Routine in and outs with follow-up chemistries 8. Hypertension. Continue Norvasc 10mg  daily Vitals:   10/29/20 1925 10/30/20 0452  BP: 138/86 (!) 158/86  Pulse: 85 73  Resp: 18 18  Temp: 98.4 F (36.9 C) 98.2 F (36.8 C)  SpO2: 97% 100%   Am elevation but not consistently  9.  Diabetes mellitus type 2 with hyperglycemia CBG (last 3)  Recent Labs    10/29/20 1634 10/29/20 2119 10/30/20 0617  GLUCAP 129* 261* 146*   Increased novalog to 10U with meals   Continue Levemir  Still labile but may be diet related , am CBG ok  10. History of alcohol polysubstance use. Urine drug screen positive cocaine. Provide counseling 11. History of hepatitis C. Follow-up outpatient 12. L foot drop- suggest possible AFO, etc and PRAFO to reduce risk of L ankle contracture 13.   Hx of schizophrenia?, was in a "recovery house" in [place] for a year after wife's death, states it was not drug rehab , increase Zyprexa, to  5mg ,saw a "Counselor " as OP denies seeing psychiatry  LOS: 11 days A FACE TO [place] 10/30/2020, 8:56 AM

## 2020-10-30 NOTE — Progress Notes (Signed)
Physical Therapy Session Note  Patient Details  Name: Lance [name] MRN: [id number] Date of Birth: 12-09-1945  Today's Date: 10/30/2020 PT Individual Time: 1015-1059; 1415-1440 PT Individual Time Calculation (min): 44 min and 25 mins  Short Term Goals: Week 2:  PT Short Term Goal 1 (Week 2): Pt will perform rolling R/L in bed with max assist of 1 PT Short Term Goal 2 (Week 2): Pt will perform sit>supine with mod assist of 2 PT Short Term Goal 3 (Week 2): Pt will perform bed<>chair transfer with +2 max assist using equipment as needed PT Short Term Goal 4 (Week 2): Patient will complete sit <> stand with LRAD and MaxA x2 consistently PT Short Term Goal 5 (Week 2): Patient will maintain static sitting balance at midline with no more than ModA for at least 30s  Skilled Therapeutic Interventions/Progress Updates:    Session 1: Patient received sitting up in wc, agreeable to PT. He reports "10/10" neck pain, premedicated. PT providing rest breaks, distractions and repositioning to assist with pain management. Patient requesting to speak with SW at some point about getting more food on his tray with meals and discuss needs at dc, specifically "diabetes strips." PT informing patient that all dc needs will be addressed as we approach dc date including all equipment. PT transporting patient in wc to therapy gym for time management and energy conservation. Sit <> stand x2 in [place] with mirror for visual feedback on posture/midline orientation. Patient able to remain midline and attend to mirror for ~5s at a time. Max verbal cues needed to facilitate and maintain midline posture. PT cuing patient to reach R UE outside BOS to encourage weight shift R in standing. Patient reporting that standing hurts his neck. Patient then standing x2 with 3 musketeers assist with much stronger L lateral lean/push originating at trunk/ LE because R UE inhibited from pushing when around techs shoulders. Patient only able to  remain standing ~25-30s because of increase in neck pain. Patient cued to aim L hip for L posterior corner of wc to assist in maintaining upright posture. Patient only able to complete this 1/2 trials despite max multi modal cues. Patient returning to room in wc, seatbelt alarm on, call light within reach.    Session 2: Patient received supine in bed, agreeable to bedlevel treat due to fatigue and "10/10" neck pain. RN alerted to patients report of pain. PT providing rest breaks, distractions and repositioning to assist with pain management. PT placing k-pad behind patients shoulders to further assist with pain management as well. No +2 assist available for safe functional mobility. PT educating patient on progress made thus far in therapy and remaining deficits + plan of care to address remaining deficits. Patient continues to perseverate on needing diabetes test strips for home use upon dc. PT reiterating to patient that he will receive the equipment he needs or at least instructions on how to access necessary equipment upon dc. Patient demonstrating improved insight into deficits stating "I know I'm pretty messed up," but had difficulty pinpointing what his deficits were to PT. At end of session, patient remaining in bed, bed alarm on, call light within reach.   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

## 2020-10-30 NOTE — Progress Notes (Signed)
Speech Language Pathology Daily Session Note  Patient Details  Name: Lance [name] MRN: [id number] Date of Birth: 12-Jul-1945  Today's Date: 10/30/2020 SLP Individual Time: 1333-1415 SLP Individual Time Calculation (min): 42 min  Short Term Goals: Week 2: SLP Short Term Goal 1 (Week 2): Patient will be able to name at least 5 items in a given category with maxA cues. SLP Short Term Goal 2 (Week 2): Patient will answer comprehension questions after reading short (1-2 sentence length) instructions, with 75% accuracy and modA. SLP Short Term Goal 3 (Week 2): Patient will complete functional tasks and simulated tasks ADL's with maxA cues for maintaining adequate sustained attention. SLP Short Term Goal 4 (Week 2): Patient will orient to time and recall specific daily events related to therapeutic and nursing/medical interventions with modA cues.  Skilled Therapeutic Interventions:Skilled ST services focused on cognitive skills. Pt was orientated to time with supervision A verbal cues for calendar when brought into visual field. SLP facilitated recall and reading comprehension at the sentence level, when matching sentence to picture in a field of 3 pt demonstrated accuracy in  9 out 10 opportunities. However in functional reading tasks, when required to recall question and locate specific information pt demonstrated accuracy in 2 out 6 opportunities. PT requested brief change, SLP and NT changed brief. Pt was left in room with call bell within reach and bed alarm set. SLP recommends to continue skilled services.     Pain Pain Assessment Pain Score: 0-No pain Faces Pain Scale: No hurt PAINAD (Pain Assessment in Advanced Dementia) Breathing: normal Negative Vocalization: none  Therapy/Group: Individual Therapy  [name]  [facility] 10/30/2020, 3:43 PM

## 2020-10-30 NOTE — Progress Notes (Signed)
Occupational Therapy Session Note  Patient Details  Name: Lance [name] MRN: [id number] Date of Birth: 06-21-1945  Today's Date: 10/30/2020 OT Individual Time: 0902-1012 OT Individual Time Calculation (min): 70 min    Short Term Goals: Week 2:  OT Short Term Goal 1 (Week 2): Pt will complete UB bathing with min assist in support sitting for two consecutive sessions. OT Short Term Goal 2 (Week 2): Pt will maintain static sitting balance for 5 mins with no more than mod assist in preparation for selfcare tasks. OT Short Term Goal 3 (Week 2): Pt will use the LUE with bathing tasks with no more than min assist to wash the RUE. OT Short Term Goal 4 (Week 2): Pt will complete toilet transfer to the drop arm commode with max assist squat pivot.  Skilled Therapeutic Interventions/Progress Updates:    Pt in bed to start, worked on transition to sitting with max assist.  Increased pushing noted to the left with cueing to reach for the foot of the bed to help decrease pushing.  He completed squat pivot transfer to the tilt in space wheelchair at total assist for work on bathing and dressing at the sink.  Pt with severe left pushing in sitting with inattention to be able to maintain gaze at the mirror for feedback.  He is able to self correct sitting posture with use of the mirror, but only for 2 seconds before pushing himself back to the left without awareness.  Mod assist for UB bathing leaning back in the wheelchair for support.  Max assist for donning pullover shirt.  Total +2 (pt 20%) for sit to stand when donning LB clothing with increased cervical and trunk flexion and severe pushing to the left.  Pt with increased frustration throughout session when therapist would try to point put his pushing to the left as well as trying to get him to maintain sustained attention to it and correct it.  He would continue to report increased neck pain throughout as well even when pressing his head into therapist's  side and stating that therapist was pushing on his neck.  Therapist would have him look into the mirror where he would realize that he is pushing to the left.   Finished session with pt in the tilt in space wheelchair reclined awaiting next session with PT in 5 mins.  Call button and phone in reach.    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

## 2020-10-31 LAB — GLUCOSE, CAPILLARY
Glucose-Capillary: 93 mg/dL (ref 70–99)
Glucose-Capillary: 109 mg/dL — ABNORMAL HIGH (ref 70–99)
Glucose-Capillary: 112 mg/dL — ABNORMAL HIGH (ref 70–99)
Glucose-Capillary: 269 mg/dL — ABNORMAL HIGH (ref 70–99)

## 2020-10-31 MED ORDER — OLANZAPINE 5 MG PO TABS
5.0000 mg | ORAL_TABLET | Freq: Every day | ORAL | Status: DC
  Administered 2020-10-31 – 2020-11-16 (×17): 5 mg via ORAL
  Filled 2020-10-31 (×18): qty 1

## 2020-10-31 MED ORDER — TAMSULOSIN HCL 0.4 MG PO CAPS
0.4000 mg | ORAL_CAPSULE | Freq: Every day | ORAL | Status: DC
  Administered 2020-10-31 – 2020-11-16 (×17): 0.4 mg via ORAL
  Filled 2020-10-31 (×17): qty 1

## 2020-10-31 NOTE — Progress Notes (Signed)
Physical Therapy Session Note  Patient Details  Name: Lance [name] MRN: [id number] Date of Birth: 1946-05-05  Today's Date: 10/31/2020 PT Individual Time: 1010-1106 and 1535-1616 PT Individual Time Calculation (min): 56 min and 41 min  Short Term Goals: Week 2:  PT Short Term Goal 1 (Week 2): Pt will perform rolling R/L in bed with max assist of 1 PT Short Term Goal 2 (Week 2): Pt will perform sit>supine with mod assist of 2 PT Short Term Goal 3 (Week 2): Pt will perform bed<>chair transfer with +2 max assist using equipment as needed PT Short Term Goal 4 (Week 2): Patient will complete sit <> stand with LRAD and MaxA x2 consistently PT Short Term Goal 5 (Week 2): Patient will maintain static sitting balance at midline with no more than ModA for at least 30s  Skilled Therapeutic Interventions/Progress Updates:    Session 1: Pt received sitting tilted back in TIS w/c and excited to participate in therapy session. Pt continues to demo internal and external distractibility with poor sustained  attention to task - noticed with sitting balance when providing pt a more cognitively engaging/challenging visual task that requires him to look straight ahead, then he is able to sustain midline trunk control for a longer duration (~9minute) with only CGA.  Transported to/from gym in w/c for time management and energy conservation. R squat pivot transfer w/c>EOM with +2 max assist for lifting/pivoting hips and maintaining trunk control. Sitting EOM, focused on trunk control, midline orientation, decreased pushing, and L UE NMR:  - placed vertical line of tape on front of pt's shirt to improve upright, midline orientation with pt having increased difficulty understanding this cuing today therefore making it less effective - placed target laterally to pt's R side that pt was required to keep his R hand incontact with to promote R trunk weight shift to prevent pushing/L lean, LOB  - when pt becomes  distracted and removes R hand from this target he immediately has L trunk LOB with no righting reactions requiring total assist to maintain upright - provided visual scanning and L UE NMR task via reaching forward to grasp object on verbal command with hand-over-hand assistance for LUE reaching as pt demos more proximal weakness  Sit<>stands 3x to/from EOM with +2 max/total assist via 3 Musketeer with mirror feedback - pt continues to be limited in standing tolerance due to posterior cervical pain when extending neck to look upright - lacks sufficient trunk/hip extension and demos R LE pushing by abducted it out to the side. L squat pivot EOM>w/c with +2 max/total assist for lifting/pivoing hips. Transported back to room and pt agreeable to remain sitting up in w/c - left tilted back in TIS w/c with needs in reach, LUE supported on pillows, and seat belt alarm on.  During session, pt reports buttocks pain stating it is from where he is being wiped so frequently during peri-care - therapist provided pillow under pelvis while sitting on mat table to increase pressure relief - [name], RN notified to perform skin assessment to ensure no breakdown.   Session 2: Pt received supine in bed and eager to participate in therapy session. Pt continues to demo impaired attention to task with internal and external distractibility requiring frequent cuing to redirect focus onto task. Supine>sitting L EOB, HOB flat and not using bedrail, with heavy max assist of 1 primarily for trunk upright as pt continues to demo rigid trunk. Continues to demo L lean/pushing requiring up to total assist  to prevent L LOB onto bed - pt continues to demo lack of righting reactions when having L LOB (ex: pt distracted by a wrapper on the floor to his L causing him to point to it with his R hand resulting in total L trunk LOB with no awareness nor initiation to prevent LOB requiring total assist to stay upright) - when focused on task at hand  and maintaining R hand in contact with a target on his R promoting R trunk weight shift then can sustain static sitting balance with CGA. Sit>stand EOB>Sara Plus dependently - pt demos good midline orientation while standing in this. Clarise Cruz transfer to [facility] w/c.  Transported to/from gym in w/c for time management and energy conservation. Sit>stand TIS w/c>3 Musketeer support with heavy +2 max/total assist to come to stand with +2 assist providing total facilitation to promote trunk/hip extension while therapist blocking L knee buckle and facilitating hip extension as well. Gait training 12ft with +2 max assist via 3 Musketeer and 3rd person providing intermittent facilitation and cuing to promote sustained trunk extension with head upright - pt demos slight pusher symptoms via keeping R LE abducted slightly requiring increased facilitation to weight shift onto R LE during stance - requires total assist to block L knee buckle during stance but is able to initiate L swing requiring max assist to advance it. Transported pt back to room and agreeable to remain sitting up in w/c - left tilted back in TIS w/c with needs in reach, L UE supported on pillows, and seat belt alarm on.   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   Session 2: No specific complaints of pain during this session.   Therapy/Group: Individual Therapy  Tawana Scale , PT, DPT, CSRS  10/31/2020, 7:53 AM

## 2020-10-31 NOTE — Progress Notes (Addendum)
Occupational Therapy Session Note  Patient Details  Name: Lance [name] MRN: [id number] Date of Birth: 02-17-1946  Today's Date: 10/31/2020 OT Individual Time: 0902-1000 OT Individual Time Calculation (min): 58 min    Short Term Goals: Week 2:  OT Short Term Goal 1 (Week 2): Pt will complete UB bathing with min assist in support sitting for two consecutive sessions. OT Short Term Goal 2 (Week 2): Pt will maintain static sitting balance for 5 mins with no more than mod assist in preparation for selfcare tasks. OT Short Term Goal 3 (Week 2): Pt will use the LUE with bathing tasks with no more than min assist to wash the RUE. OT Short Term Goal 4 (Week 2): Pt will complete toilet transfer to the drop arm commode with max assist squat pivot.  Skilled Therapeutic Interventions/Progress Updates:    Session 1 (0902-1000) Pt completed transfer from supine to sit EOB with max assist to begin session.  Increased pusher tendencies noted in sitting while working on washing up and donning clothing.  He was able to maintain static sitting balance with overall mod assist with max instructional cueing at times to lean down onto the right forearm to help decrease pushing.  He worked on washing his face and front peri area with max assist for dynamic sitting balance.  Total +2 (pt 25%) was needed for sit to stand and standing in order to complete washing buttocks as well as for pulling brief and pants over his hips.  He was able to complete donning the brief and pants over his feet with max assist as well.  Next he was able to complete stand pivot transfer using three muskateers technique with total +2 (pt 25%).  He exhibits increased pushing in standing as well as trunk and cervical flexion with transfer.  He was able to advance the LLE with mod assist once weightshift to the right was completed. He completed oral hygiene from seated position with supervision and mod demonstrational cueing for integration of the  LUE with min to mod assist.  Call button and phone in reach with safety alarm belt in place.    Session 2 (1400-1434)  Pt in bed to start with work on rolling side to side for changing of brief with mod assist for rolling side to side.  He required total assist for doffing new brief and donning new one as well as pulling his pants over the feet.  He then worked on supine to sit with max assist.  Had him focus on static sitting balance.   He was able to demonstrate episodes of 20-30 seconds where he could maintain static sitting balance with min assist, but overall he would continue to push forward and to the left with max assist to regain midline.  When asked if he was leaning he would respond yes, but could not spontaneously correct it.  Finished session with return to supine from sitting with mod assist.  He was left with the call button and phone in reach as well as safety alarm in place.    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

## 2020-10-31 NOTE — Progress Notes (Signed)
PROGRESS NOTE   Subjective/Complaints:  freq urination but no burning with urination    ROS: Denies CP, SOB, N/V/D  Objective:   No results found. No results for input(s): WBC, HGB, HCT, PLT in the last 72 hours. No results for input(s): NA, K, CL, CO2, GLUCOSE, BUN, CREATININE, CALCIUM in the last 72 hours.  Intake/Output Summary (Last 24 hours) at 10/31/2020 0834 Last data filed at 10/31/2020 0700 Gross per 24 hour  Intake 712 ml  Output 125 ml  Net 587 ml        Physical Exam: Vital Signs Blood pressure (!) 145/74, pulse 63, temperature 98.2 F (36.8 C), temperature source Oral, resp. rate 18, weight 77.9 kg, SpO2 100 %.  General: No acute distress Mood and affect are appropriate Heart: Regular rate and rhythm no rubs murmurs or extra sounds Lungs: Clear to auscultation, breathing unlabored, no rales or wheezes Abdomen: Positive bowel sounds, soft nontender to palpation, nondistended Extremities: No clubbing, cyanosis, or edema Skin: No evidence of breakdown, no evidence of rash  Neuro: Alert Motor: RUE/RLE: 5/5 proximal distal LUE: RUE -/5 proximal distal LLE: 0/5 proximal and distal  Assessment/Plan: 1. Functional deficits which require 3+ hours per day of interdisciplinary therapy in a comprehensive inpatient rehab setting.  Physiatrist is providing close team supervision and 24 hour management of active medical problems listed below.  Physiatrist and rehab team continue to assess barriers to discharge/monitor patient progress toward functional and medical goals  Care Tool:  Bathing    Body parts bathed by patient: Chest,Abdomen,Left arm,Front perineal area,Right upper leg,Left upper leg,Face   Body parts bathed by helper: Buttocks,Right arm Body parts n/a: Left lower leg,Right lower leg   Bathing assist Assist Level: 2 Helpers (sit to stand)     Upper Body Dressing/Undressing Upper body  dressing   What is the patient wearing?: Pull over shirt    Upper body assist Assist Level: Maximal Assistance - Patient 25 - 49%    Lower Body Dressing/Undressing Lower body dressing      What is the patient wearing?: Incontinence brief,Pants     Lower body assist Assist for lower body dressing: 2 Helpers (sit to stand)     Chartered loss adjuster assist Assist for toileting: Dependent - Patient 0% (using bedpan)     Transfers Chair/bed transfer  Transfers assist  Chair/bed transfer activity did not occur: Safety/medical concerns  Chair/bed transfer assist level: Total Assistance - Patient < 25% (squat pivot) Chair/bed transfer assistive device: Sliding board   Locomotion Ambulation   Ambulation assist   Ambulation activity did not occur: Safety/medical concerns          Walk 10 feet activity   Assist  Walk 10 feet activity did not occur: Safety/medical concerns        Walk 50 feet activity   Assist Walk 50 feet with 2 turns activity did not occur: Safety/medical concerns         Walk 150 feet activity   Assist Walk 150 feet activity did not occur: Safety/medical concerns         Walk 10 feet on uneven surface  activity   Assist Walk  10 feet on uneven surfaces activity did not occur: Safety/medical concerns         Wheelchair     Assist Will patient use wheelchair at discharge?: Yes (TBD but likely) Type of Wheelchair: Manual           Wheelchair 50 feet with 2 turns activity    Assist    Wheelchair 50 feet with 2 turns activity did not occur: Safety/medical concerns       Wheelchair 150 feet activity     Assist  Wheelchair 150 feet activity did not occur: Safety/medical concerns       Blood pressure (!) 145/74, pulse 63, temperature 98.2 F (36.8 C), temperature source Oral, resp. rate 18, weight 77.9 kg, SpO2 100 %.  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 Antithrombotics: -DVT/anticoagulation:SCDs -antiplatelet therapy: N/A 3. Pain management:Tylenol as needed  5/16: topamax 25mg  started at night for headache/insomnia. Lidocaine patch added for coccyx pain  No c/os  4. Mood/schizophrenia:Provide emotional support- was on Zyprexa 5 mg at home- will resume QHS which should help with sleep  -antipsychotic agents: resume Zyprexa 5. Neuropsych: This patientiscapable of making decisions on hisown behalf. 6. Skin/Wound Care:Routine skin checks 7. Fluids/Electrolytes/Nutrition:Routine in and outs with follow-up chemistries 8. Hypertension. Continue Norvasc 10mg  daily Vitals:   10/30/20 2020 10/31/20 0527  BP: 132/81 (!) 145/74  Pulse: 78 63  Resp: 18 18  Temp: 97.8 F (36.6 C) 98.2 F (36.8 C)  SpO2: 100% 100%   Am elevation but not consistently  9.  Diabetes mellitus type 2 with hyperglycemia CBG (last 3)  Recent Labs    10/30/20 1658 10/30/20 2111 10/31/20 0617  GLUCAP 222* 176* 93   Increased novalog to 10U with meals   Continue Levemir  Some improvement but pm cbgs remain elevated  10. History of alcohol polysubstance use. Urine drug screen positive cocaine. Provide counseling 11. History of hepatitis C. Follow-up outpatient 12. L foot drop- suggest possible AFO, etc and PRAFO to reduce risk of L ankle contracture 13.   Hx of schizophrenia?, was in a "recovery house" in [place] for a year after wife's death, states it was not drug rehab , increase Zyprexa, to  5mg ,saw a "Counselor " as OP denies seeing psychiatry  14.  Urinary freq , small amt will trial  flomax LOS: 12 days A FACE TO [place] E Rickey Sadowski 10/31/2020, 8:34 AM

## 2020-11-01 LAB — GLUCOSE, CAPILLARY
Glucose-Capillary: 156 mg/dL — ABNORMAL HIGH (ref 70–99)
Glucose-Capillary: 147 mg/dL — ABNORMAL HIGH (ref 70–99)
Glucose-Capillary: 310 mg/dL — ABNORMAL HIGH (ref 70–99)
Glucose-Capillary: 111 mg/dL — ABNORMAL HIGH (ref 70–99)

## 2020-11-01 MED ORDER — TIZANIDINE HCL 2 MG PO TABS
2.0000 mg | ORAL_TABLET | Freq: Three times a day (TID) | ORAL | Status: DC
  Administered 2020-11-01 – 2020-11-17 (×64): 2 mg via ORAL
  Filled 2020-11-01 (×63): qty 1

## 2020-11-01 NOTE — Progress Notes (Signed)
Physical Therapy Session Note  Patient Details  Name: Lance [name] MRN: [id number] Date of Birth: [birth year]  Today's Date: 11/01/2020 PT Individual Time: 1102-1158 PT Individual Time Calculation (min): 56 min   Short Term Goals: Week 2:  PT Short Term Goal 1 (Week 2): Pt will perform rolling R/L in bed with max assist of 1 PT Short Term Goal 2 (Week 2): Pt will perform sit>supine with mod assist of 2 PT Short Term Goal 3 (Week 2): Pt will perform bed<>chair transfer with +2 max assist using equipment as needed PT Short Term Goal 4 (Week 2): Patient will complete sit <> stand with LRAD and MaxA x2 consistently PT Short Term Goal 5 (Week 2): Patient will maintain static sitting balance at midline with no more than ModA for at least 30s  Skilled Therapeutic Interventions/Progress Updates:    Patient received sitting up in TIS wc, agreeable to PT. He denies pain at rest and reports decreased neck pain throughout the day so far today. PT transporting patient in wc to therapy gym for time management and energy conservation. Sit > stand in [place] with mirror for visual feedback. Patient able to remain standing for ~3 mins total and attend to mirror for roughly 10s at a time. Numbers placed on mirror to allow patient to complete simple dual task math addition problems with number. No evidence of pushing to the L evident, even with divided attention. Patient responsive to verbal and tactile cues to aim L hip for posterior left corner of wc. Patient with external cue to reach R UE outside BOS to further prevent L pushing. Patient standing again in [place] and able to attend to mirror and posture for ~20-30s at a time with no evidence of pushing. PT discussing with patient progress through therapy and importance of follow through with cues provided and therapeutic tasks that he is guided through. Patient receptive to discussion about participation in therapy. Sit <> stand with MaxA x2 and 3 musketeers  assist. He remains with increased L knee flexor tone resulting in further L lateral lean/weight displacement. During 2nd stand, PT able to facilitate increased L knee extension in stance and therefore improved midline posture. PT also able to note trace activation of L hip flexors in sitting and trace hip extensors in stance. Patient returning to room in wc, seatbelt alarm on, call light within reach.   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

## 2020-11-01 NOTE — Progress Notes (Signed)
Occupational Therapy Session Note  Patient Details  Name: Lance [name] MRN: [id number] Date of Birth: [birth year]  Today's Date: 11/01/2020 OT Individual Time: 0902-1006 OT Individual Time Calculation (min): 64 min    Short Term Goals: Week 2:  OT Short Term Goal 1 (Week 2): Pt will complete UB bathing with min assist in support sitting for two consecutive sessions. OT Short Term Goal 2 (Week 2): Pt will maintain static sitting balance for 5 mins with no more than mod assist in preparation for selfcare tasks. OT Short Term Goal 3 (Week 2): Pt will use the LUE with bathing tasks with no more than min assist to wash the RUE. OT Short Term Goal 4 (Week 2): Pt will complete toilet transfer to the drop arm commode with max assist squat pivot.  Skilled Therapeutic Interventions/Progress Updates:    Session 1 [phone]) Pt completed supine to sit EOB with max assist for work on bathing and dressing EOB.  He was able to maintain static sitting balance with min assist for periods of 30 seconds or less and then needs max assist secondary to pushing and LOB to the left.  Max demonstrational cueing for sequencing and completion of ADL unsupported EOB with target on the left side to reach to and help regain balance.  Once supported, he can complete UB bathing with min assist and increased time.  LB bathing at total assist +2 (pt 25%) for sit to stand with moderate pushing and increased trunk and cervical flexion in standing.  Max assist for donning pullover shirt with total +2 (pt 25%) for LB dressing and for squat pivot transfer to the right secondary to severe left pushing.  Finished session with pt in the tilt in space wheelchair with the call button and phone in reach and safety belt in place.    Session 2 [phone])  Pt up in wheelchair to start session with increased sleepiness noted.  Took him down to the dayroom via wheelchair where he worked on sit to stand and standing balance with total +2 (pt  25%) assist.  He was able to complete standing with use of the mirror for feedback.  Increased trunk and bilateral knee flexion is noted in standing.  Worked on having him stand up tall by having to pick up and place clothespins on a vertical bar with the RUE which was placed to the right and overhead to promote upright posture and right weightshift.  He was able to complete this for 2 intervals of 6-8 reps while standing.  Finished session with scooting back in the tilt in space wheelchair with max assist and return to the room.  He was left sitting up in the wheelchair with the call button and phone in reach and safety belt in place.    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

## 2020-11-01 NOTE — Progress Notes (Signed)
PROGRESS NOTE   Subjective/Complaints:  No issues overnite  ROS: Denies CP, SOB, N/V/D  Objective:   No results found. No results for input(s): WBC, HGB, HCT, PLT in the last 72 hours. No results for input(s): NA, K, CL, CO2, GLUCOSE, BUN, CREATININE, CALCIUM in the last 72 hours.  Intake/Output Summary (Last 24 hours) at 11/01/2020 0904 Last data filed at 11/01/2020 0748 Gross per 24 hour  Intake 592 ml  Output --  Net 592 ml        Physical Exam: Vital Signs Blood pressure (!) 147/88, pulse 65, temperature (!) 97.5 F (36.4 C), temperature source Oral, resp. rate 16, weight 77.9 kg, SpO2 100 %.  General: No acute distress Mood and affect are appropriate Heart: Regular rate and rhythm no rubs murmurs or extra sounds Lungs: Clear to auscultation, breathing unlabored, no rales or wheezes Abdomen: Positive bowel sounds, soft nontender to palpation, nondistended Extremities: No clubbing, cyanosis, or edema Skin: No evidence of breakdown, no evidence of ras Neuro: Alert Motor: RUE/RLE: 5/5 proximal distal LUE: RUE -/5 proximal distal LLE: 0/5 proximal and distal  Assessment/Plan: 1. Functional deficits which require 3+ hours per day of interdisciplinary therapy in a comprehensive inpatient rehab setting.  Physiatrist is providing close team supervision and 24 hour management of active medical problems listed below.  Physiatrist and rehab team continue to assess barriers to discharge/monitor patient progress toward functional and medical goals  Care Tool:  Bathing    Body parts bathed by patient: Face,Right upper leg,Left upper leg   Body parts bathed by helper: Buttocks,Front perineal area Body parts n/a: Right lower leg,Left lower leg,Right arm,Left arm,Chest,Abdomen   Bathing assist Assist Level: 2 Helpers     Upper Body Dressing/Undressing Upper body dressing   What is the patient wearing?: Pull over  shirt    Upper body assist Assist Level: Maximal Assistance - Patient 25 - 49%    Lower Body Dressing/Undressing Lower body dressing      What is the patient wearing?: Incontinence brief,Pants     Lower body assist Assist for lower body dressing: 2 Helpers     Toileting Toileting    Toileting assist Assist for toileting: Maximal Assistance - Patient 25 - 49%     Transfers Chair/bed transfer  Transfers assist  Chair/bed transfer activity did not occur: Safety/medical concerns  Chair/bed transfer assist level: 2 Helpers (+2 max A squat pivot) Chair/bed transfer assistive device: Sliding board   Locomotion Ambulation   Ambulation assist   Ambulation activity did not occur: Safety/medical concerns  Assist level: 2 helpers (+2 max/total A) Assistive device: Other (comment) (3 Muskteer) Max distance: 58ft   Walk 10 feet activity   Assist  Walk 10 feet activity did not occur: Safety/medical concerns        Walk 50 feet activity   Assist Walk 50 feet with 2 turns activity did not occur: Safety/medical concerns         Walk 150 feet activity   Assist Walk 150 feet activity did not occur: Safety/medical concerns         Walk 10 feet on uneven surface  activity   Assist Walk 10 feet on  uneven surfaces activity did not occur: Safety/medical concerns         Wheelchair     Assist Will patient use wheelchair at discharge?: Yes (TBD but likely) Type of Wheelchair: Manual           Wheelchair 50 feet with 2 turns activity    Assist    Wheelchair 50 feet with 2 turns activity did not occur: Safety/medical concerns       Wheelchair 150 feet activity     Assist  Wheelchair 150 feet activity did not occur: Safety/medical concerns       Blood pressure (!) 147/88, pulse 65, temperature (!) 97.5 F (36.4 C), temperature source Oral, resp. rate 16, weight 77.9 kg, SpO2 100 %.  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 Antithrombotics: -DVT/anticoagulation:SCDs -antiplatelet therapy: N/A 3. Pain management:Tylenol as needed  5/16: topamax 25mg  started at night for headache/insomnia. Lidocaine patch added for coccyx pain  No c/os  4. Mood/schizophrenia:Provide emotional support- was on Zyprexa 5 mg at home- will resume QHS which should help with sleep  -antipsychotic agents: resume Zyprexa 5mg  5. Neuropsych: This patientiscapable of making decisions on hisown behalf. 6. Skin/Wound Care:Routine skin checks 7. Fluids/Electrolytes/Nutrition:Routine in and outs with follow-up chemistries 8. Hypertension. Continue Norvasc 10mg  daily Vitals:   10/31/20 2025 11/01/20 0523  BP: 133/64 (!) 147/88  Pulse: 78 65  Resp: 18 16  Temp: 98.4 F (36.9 C) (!) 97.5 F (36.4 C)  SpO2: 100% 100%   Am elevation but not consistently  9.  Diabetes mellitus type 2 with hyperglycemia CBG (last 3)  Recent Labs    10/31/20 1629 10/31/20 2115 11/01/20 0623  GLUCAP 112* 269* 156*   Increased novalog to 10U with meals   Continue Levemir  Some improvement but pm cbgs remain elevated- discuss dietary habits with nursing   10. History of alcohol polysubstance use. Urine drug screen positive cocaine. Provide counseling 11. History of hepatitis C. Follow-up outpatient 12. L foot drop- suggest possible AFO, etc and PRAFO to reduce risk of L ankle contracture 13.   Hx of schizophrenia?, was in a "recovery house" in [place] for a year after wife's death, states it was not drug rehab , increase Zyprexa, to  5mg ,saw a "Counselor " as OP denies  seeing psychiatry  14.  Urinary freq , small amt will trial flomax, incont  LOS: 13 days A FACE TO [place] Lance [name] 11/01/2020, 9:04 AM

## 2020-11-01 NOTE — Progress Notes (Signed)
Patient ID: Lance [name], male   DOB: 10/15/1945, 75 y.o.   MRN: [id number]   Sw called pt daughter to provide conference updates. No answer- left detailed VM.  [place], [place]

## 2020-11-01 NOTE — Patient Care Conference (Signed)
Inpatient RehabilitationTeam Conference and Plan of Care Update Date: 11/01/2020   Time: 10:56 AM    Patient Name: Lance [name]      Medical Record Number: [id number]  Date of Birth: July 24, 1945 Sex: Male         Room/Bed: 4W24C/4W24C-01 Payor Info: Payor: [employer] MEDICARE / Plan: [place] HMO / Product Type: *No Product type* /    Admit Date/Time:  10/19/2020  1:15 PM  Primary Diagnosis:  Intraparenchymal hemorrhage of brain [facility])  Hospital Problems: Principal Problem:   Intraparenchymal hemorrhage of brain ([place]) Active Problems:   Controlled type 2 diabetes mellitus with hyperglycemia [facility])   Essential hypertension   Vascular headache   Spastic hemiparesis [facility])    Expected Discharge Date: Expected Discharge Date: 11/17/20  Team Members Present: Physician leading conference: Dr. Alysia Penna Care Coodinator Present: Dorien Chihuahua, RN, BSN, CRRN;Christina [place], BSW Nurse Present: Dorien Chihuahua, RN PT Present: Estevan Ryder, PT OT Present: Clyda Greener, OT SLP Present: Nadara Mode, SLP PPS Coordinator present : Gunnar Fusi, SLP     Current Status/Progress Goal Weekly Team Focus  Bowel/Bladder   pt is incont at times. Last BM-5/24  Pt gains cont. of B/B  assess q shift and PRN   Swallow/Nutrition/ Hydration             ADL's   Min assist for UB bathing and mod assist for UB dressing in supported sitting, setup for oral hygiene in supported sitting as well.  total +2 for LB selfcare sit to stand or toileting tasks as well as total +2 for sliding board or squat pivot transfers.  min to mod assist  selfcare retraining, transfer training, DME education, neuromuscular re-educaiton, pt education, therapeutic exercise   Mobility   max assist supine>sit, +2 max A sit>supine, +2 max/total assist squat pivot transfers, +2 total assist sit<>stand via 3 Muskteer, +2 max/total assist gait up to 70ft via 3 [place] (3rd person w/c follow); continues to demo  significant L inttention, pusher syndrome, impaired midline orientation, poor focused and sustained attention to tasks, as well as significant L hemiplegia  mod assist overall, will monitor if needing to be downgraded based on pt progress  activity tolerance, midline orientation, pt awareness, bed mobility training, transfer training, sitting balance/trunk control, L UE and L LE NMR, standing balance and tolerance, progression towards gait training, pt education   Communication             Safety/Cognition/ Behavioral Observations            Pain   Moderate pain to lower back/ shoulders. Pain managment in progress, effective  Pain <3 out of 10  assess pain q shift and PRN   Skin   skin intact  Skin remains free of injury  Assess skin q shift and PRNN     Discharge Planning:  pt to d/c home with dtrs to provide care   Team Discussion: Left spastic hemiparesis and dysarthria, confusion. Also note  Left inattention, impaired midline orientation, poor sustained attention, poor motor planning and pusher syndrome. CBGs fluctuate up in the evening; focus on dietary modifications and decreasing snacks in the evenings.  Patient on target to meet rehab goals: Currently min assist for upper body care and total assist +2 for lower body care. +2 Max assist for squat pivot transfers, and able to ambulate 3' with 3 muskateer assist. Reports HA and neck pain due to straining.   *See Care Plan and progress notes for long and short-term  goals.   Revisions to Treatment Plan:   Teaching Needs: Transfers and toileting using a hoyer lift, medications, secondary stroke risk management, etc.   Current Barriers to Discharge: Decreased caregiver support, Home enviroment access/layout and Incontinence  Possible Resolutions to Barriers: Recommend DME/Hoyer lift Customized wheelchair Family education       Medical Summary Current Status: poor sleep at noc incont of bowel adn bladder, pushers syndrome,  spasiticty LLE  Barriers to Discharge: Medical stability   Possible Resolutions to [employer] Focus: adjust spasticity meds, increase midline awareness   Continued Need for Acute Rehabilitation Level of Care: The patient requires daily medical management by a physician with specialized training in physical medicine and rehabilitation for the following reasons: Direction of a multidisciplinary physical rehabilitation program to maximize functional independence : Yes Medical management of patient stability for increased activity during participation in an intensive rehabilitation regime.: Yes Analysis of laboratory values and/or radiology reports with any subsequent need for medication adjustment and/or medical intervention. : Yes   I attest that I was present, lead the team conference, and concur with the assessment and plan of the team.   Dorien Chihuahua B 11/01/2020, 2:13 PM

## 2020-11-01 NOTE — Progress Notes (Signed)
Speech Language Pathology Daily Session Note  Patient Details  Name: Lance [name] MRN: [id number] Date of Birth: 13-Jan-1946  Today's Date: 11/01/2020 SLP Individual Time: 0511-0211 SLP Individual Time Calculation (min): 45 min  Short Term Goals: Week 2: SLP Short Term Goal 1 (Week 2): Patient will be able to name at least 5 items in a given category with maxA cues. SLP Short Term Goal 2 (Week 2): Patient will answer comprehension questions after reading short (1-2 sentence length) instructions, with 75% accuracy and modA. SLP Short Term Goal 3 (Week 2): Patient will complete functional tasks and simulated tasks ADL's with maxA cues for maintaining adequate sustained attention. SLP Short Term Goal 4 (Week 2): Patient will orient to time and recall specific daily events related to therapeutic and nursing/medical interventions with modA cues.  Skilled Therapeutic Interventions:   Patient seen for skilled ST session focusing on cognitive-linguistic goals. Patient did not recognize SLP from previous sessions, however he was in significantly better mood and very receptive towards working with SLP. He was able to name and write down 6 different objects in category (fruits, animals) with min-modA cues. He did perseverate on topics during semi-structured conversation. SLP provided patient with notebook for keeping track of daily events and therapies as patient reported he did this at home. SLP to f/u with patient on his ability to use adequately. Patient continues to benefit from skilled SLP intervention to maximize cognitive-linguistic goals prior to discharge.  Pain Pain Assessment Pain Scale: 0-10 Pain Score: 0-No pain  Therapy/Group: Individual Therapy  Sonia Baller, MA, CCC-SLP Speech Therapy

## 2020-11-02 LAB — GLUCOSE, CAPILLARY
Glucose-Capillary: 114 mg/dL — ABNORMAL HIGH (ref 70–99)
Glucose-Capillary: 210 mg/dL — ABNORMAL HIGH (ref 70–99)
Glucose-Capillary: 150 mg/dL — ABNORMAL HIGH (ref 70–99)
Glucose-Capillary: 115 mg/dL — ABNORMAL HIGH (ref 70–99)

## 2020-11-02 NOTE — Progress Notes (Signed)
Speech Language Pathology Daily Session Note  Patient Details  Name: Lance [name] MRN: [id number] Date of Birth: Jul 06, 1945  Today's Date: 11/02/2020 SLP Individual Time: 1300-1400 SLP Individual Time Calculation (min): 60 min  Short Term Goals: Week 2: SLP Short Term Goal 1 (Week 2): Patient will be able to name at least 5 items in a given category with maxA cues. SLP Short Term Goal 2 (Week 2): Patient will answer comprehension questions after reading short (1-2 sentence length) instructions, with 75% accuracy and modA. SLP Short Term Goal 3 (Week 2): Patient will complete functional tasks and simulated tasks ADL's with maxA cues for maintaining adequate sustained attention. SLP Short Term Goal 4 (Week 2): Patient will orient to time and recall specific daily events related to therapeutic and nursing/medical interventions with modA cues.  Skilled Therapeutic Interventions: Skilled SLP intervention focused on cognition. Pt completed mildly complex card sorting task  requiring sustained attention for 10 minutes he completed with mod A verbal and visual cues. Pt named 5 items in simple categories with max a descriptive cues to name more than 3 items. He read and answered simple comprehension questions related to today's therapy schedule with min-mod A verbal cues. Pt demonstrated use of call button during session to request tylenol due to pain in neck.  Cont with therapy per plan of care.      Pain Pain Assessment Pain Scale: Faces Faces Pain Scale: Hurts a little bit Pain Location: Neck Pain Descriptors / Indicators: Aching  Therapy/Group: Individual Therapy  Darrol Poke Kemet Nijjar 11/02/2020, 1:52 PM

## 2020-11-02 NOTE — Progress Notes (Signed)
Physical Therapy Session Note  Patient Details  Name: Lance [name] MRN: [id number] Date of Birth: [birth year]  Today's Date: 11/02/2020 PT Individual Time: 4825-0037 PT Individual Time Calculation (min): 53 min   Short Term Goals: Week 2:  PT Short Term Goal 1 (Week 2): Pt will perform rolling R/L in bed with max assist of 1 PT Short Term Goal 2 (Week 2): Pt will perform sit>supine with mod assist of 2 PT Short Term Goal 3 (Week 2): Pt will perform bed<>chair transfer with +2 max assist using equipment as needed PT Short Term Goal 4 (Week 2): Patient will complete sit <> stand with LRAD and MaxA x2 consistently PT Short Term Goal 5 (Week 2): Patient will maintain static sitting balance at midline with no more than ModA for at least 30s Skilled Therapeutic Interventions/Progress Updates:   Pt received supine in bed and agreeable to PT. Supine>sit transfer with mod assist through log roll to the L. Sitting balance EOB with min assist and cues for lateal reach to the R to prevent pushing to the L. Squat pivot transfer to [facility] with max assist +2. Pt noted to have been incontinent. Sit<>stand x 2 with +2, 42msketeer assist to don/doff brief. [facility] and RLE blocked and BUE support over PT/ tech shoulders x 3 with visual feedback from mirror to improve midline and trunkal extension. Pt performed pregat training for forward/reverse stepping x 10 BLE. With +2 assist as listed. Gait training then performed with +3 assist for additional WC follow x 530fwith max cues for attention to task, posture, step length and midline orientation; as well as max-total A to advance the LLE. Pt returned to room and performed squat pivot transfer to bed with max assist. Sitting balance EOB x 5 minutes with supervision assist-min assist with moderate cues for midline.  Sit>supine completed with mod assist through log roll, and left supine in bed with call bell in  reach and all needs met.        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

## 2020-11-02 NOTE — Progress Notes (Signed)
Occupational Therapy Session Note  Patient Details  Name: Lance [name] MRN: [id number] Date of Birth: 11/04/1945  Today's Date: 11/02/2020 OT Individual Time: 7416-3845 OT Individual Time Calculation (min): 68 min    Short Term Goals: Week 1:  OT Short Term Goal 1 (Week 1): Pt will complete UB bathing with min assist in support sitting for two consecutive sessions. OT Short Term Goal 1 - Progress (Week 1): Not met OT Short Term Goal 2 (Week 1): Pt will maintain static sitting balance for 5 mins with no more than mod assist in preparation for selfcare tasks. OT Short Term Goal 2 - Progress (Week 1): Not met OT Short Term Goal 3 (Week 1): Pt will donn pullover shirt with mod assist in supported sitting following hemi techniques. OT Short Term Goal 3 - Progress (Week 1): Met OT Short Term Goal 4 (Week 1): Pt will complete toilet transfer to the drop arm commode with max assist squat pivot. OT Short Term Goal 4 - Progress (Week 1): Not met OT Short Term Goal 5 (Week 1): Pt will use the LUE with bathing tasks with no more than min assist to wash the RUE. OT Short Term Goal 5 - Progress (Week 1): Not met Week 2:  OT Short Term Goal 1 (Week 2): Pt will complete UB bathing with min assist in support sitting for two consecutive sessions. OT Short Term Goal 2 (Week 2): Pt will maintain static sitting balance for 5 mins with no more than mod assist in preparation for selfcare tasks. OT Short Term Goal 3 (Week 2): Pt will use the LUE with bathing tasks with no more than min assist to wash the RUE. OT Short Term Goal 4 (Week 2): Pt will complete toilet transfer to the drop arm commode with max assist squat pivot.   Skilled Therapeutic Interventions/Progress Updates:    Pt greeted at time of session supine in bed resting agreeable to OT session. Mild discomfort in L side of neck. Pt noted to be incontinent of urine, rolled R with Mod/Max and L with Min/CGA for therapist to change brief and complete  perihygiene. Pt states he "sometimes" knows when he has to go, knew he was wet today, but waited for therapy and did not call. Pt wanting to lay on L side for a few minutes with heating pad, proprioceptive input for L side. Sidelying > sit Max A, Max A for sitting balance initially fading to occasional CGA with frequent LOB to L side d/t pushing. UB dressing with Mod A for sitting balance and Max A for donning/doffing. Donned pants sitting EOB to thread, trialed sit > stand level but unable, trialed lateral leans but unable to lean far enough, returned to supine and rolled to don over hips. Pt transfer bed > wheelchair with Sara+ with 2nd helper (RN) spotting for safety. Set up with alarm on call bell in reach and LUE elevated.    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

## 2020-11-03 LAB — GLUCOSE, CAPILLARY
Glucose-Capillary: 77 mg/dL (ref 70–99)
Glucose-Capillary: 259 mg/dL — ABNORMAL HIGH (ref 70–99)
Glucose-Capillary: 119 mg/dL — ABNORMAL HIGH (ref 70–99)
Glucose-Capillary: 290 mg/dL — ABNORMAL HIGH (ref 70–99)

## 2020-11-03 MED ORDER — MELATONIN 3 MG PO TABS
3.0000 mg | ORAL_TABLET | Freq: Every day | ORAL | Status: DC
  Administered 2020-11-03 – 2020-11-16 (×14): 3 mg via ORAL
  Filled 2020-11-03 (×14): qty 1

## 2020-11-03 NOTE — Progress Notes (Signed)
Occupational Therapy Weekly Progress Note  Patient Details  Name: Lance [name] MRN: [id number] Date of Birth: Apr 26, 1946  Beginning of progress report period: Oct 27, 2020 End of progress report period: Nov 03, 2020  Today's Date: 11/03/2020 OT Individual Time: 1118-1200 OT Individual Time Calculation (min): 42 min    Patient has met 2 of 4 short term goals.  [name] continues to make slower progress with OT than originally expected.  UB bathing is currently at a min assist level with UB dressing at mod to max assist, both in supported sitting positions.  They are both max assist when completed unsupported EOB or EOC.  He continues to demonstrate increased pusher tendencies to the left in sitting with periods of being able to maintain static sitting balance on the EOB with min to min guard assist for up to five mins.  However, once he has to divide his attention between a task and his balance, he pushes himself over to the left without awareness to correct it.  LB bathing and dressing are at a total +2 (pt 25%) level with sit to stand.  Increased trunk and cervical flexion is still present with increased pusher tendencies to the left in standing as well.   He continues to demonstrate Brunnstrum stage III movement in the left arm and stage V in the hand.  Greater proximal weakness is noted with full grasp and released present in the hand.  He doesn't however integrate the LUE spontaneously secondary to neglect, requiring max instructional cueing and mod assist to use it when washing the RUE and right shoulder.  Transfers are currently total +2 (pt 25%) squat pivot or sliding board.  Feel overall his progress is slower than originally predicted and his current goals of min to mod assist are not reasonable at this time.  Will downgrade to more mod to max assist as appropriate with recommendation for continued CIR level therapies at this time.     Patient continues to demonstrate the following  deficits: muscle weakness, muscle joint tightness and muscle paralysis, impaired timing and sequencing, unbalanced muscle activation, motor apraxia, decreased coordination and decreased motor planning, field cut, decreased midline orientation, left side neglect and decreased motor planning, decreased attention, decreased awareness, decreased problem solving, decreased safety awareness, decreased memory and delayed processing and decreased sitting balance, decreased standing balance, decreased postural control, hemiplegia and decreased balance strategies and therefore will continue to benefit from skilled OT intervention to enhance overall performance with BADL and Reduce care partner burden.  Patient progressing toward long term goals..  Continue plan of care.  OT Short Term Goals Week 3:  OT Short Term Goal 1 (Week 3): Pt will use the LUE with bathing tasks with no more than min assist to wash the RUE. OT Short Term Goal 2 (Week 3): Pt will complete toilet transfer to the drop arm commode with max assist squat pivot. OT Short Term Goal 3 (Week 3): Pt will donn pullover shirt in supported sitting with min assist. OT Short Term Goal 4 (Week 3): Pt will complete sit to stand for LB selfcare with max assist at the sink or from EOB.  Skilled Therapeutic Interventions/Progress Updates:    Session 1: (0350-0938)  Pt in bed to start with transition to sitting at max assist level on the left side of the bed.  He was able to maintain static sitting for over 5 mins with min guard assist with the RUE stabilized on his knee or on the  bed.  Once he began working on oral hygiene tasks however, he exhibited increased lean and LOB to the left, requiring overall max assist to regain.  Pt with decreased awareness, decreased problem solving and decreased attention to the LUE.  When attempting to open toothpaste, he placed the past in the right hand and then attempted to loosen the top by pushing it into the towel in his  lap, instead of using the LUE to help hold it.  Mod instructional cueing for initiation of using the LUE to assist with this task.  He was internally distracted at times while sitting with objects on the left side such as a towel and his lotions.  He would move both objects while talking to therapist and then move them back without any direct plan or reason as to why he had moved them.  Finished session with total assist squat pivot transfer to the right to the tilt in space wheelchair.  Pt with increased pushing to the left noted making it extremely difficult for him to shift his hips to the right during transfer.  He was left positioned in the chair with the call button and phone in reach with safety belt in place and NT in changing linens.    Session 2: (5003-7048) Pt in bed to start session, worked on transfer to the EOB with max assist to work on sitting balance as well as scooting up the EOB.  He needed min assist for static sitting balance with max assist for dynamic sitting balance with transitional movements scooting up and down the bed.  Significant increased pushing to the left when attempting forward trunk flexion for scooting.  Max assist for taking small scoots up and down the EOB, with greater difficulty noted when attempting to scoot to the right.  Finished session with transfer to supine with max assist where he was left with the call button and phone in reach and safety alarm in place.    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 Restrictions Weight Bearing Restrictions: No  Pain: Pain Assessment Pain Scale: Faces Pain Score: 9  Faces Pain Scale: No hurt Pain Type: Acute pain Pain Location: Neck Pain Orientation: Medial Pain Descriptors / Indicators: Aching ADL: See Care Tool Section for some details of mobility and selfcare  Therapy/Group: Individual Therapy  Coda Mathey,[place]  OTR/L 11/03/2020, 12:57 PM

## 2020-11-03 NOTE — Progress Notes (Signed)
Physical Therapy Weekly Progress Note  Patient Details  Name: Lance [name] MRN: [id number] Date of Birth: [birth year]  Beginning of progress report period: Oct 27, 2020 End of progress report period: Nov 03, 2020  Today's Date: 11/03/2020 PT Individual Time: 1345-1440 PT Individual Time Calculation (min): 55 min   Patient has met 5 of 5 short term goals.  Patient making slow progress toward his goals. He remains limited by poor attention to task, dense L hemi with L Pusher's, poor coordination, poor proprioception at the trunk compounding poor sitting balance, increased L flexor tone in LE. He continues to require grossly +2 assist for functional mobility for safety, though he has improved to being able to maintain static sitting balance with CGA/MinA for a few minutes at a time. He does demonstrate poor ability to complete dual motor task and maintain appropriate sitting balance, but has progressed to being able to complete dual cog task while maintaining appropriate sitting balance.   Patient continues to demonstrate the following deficits muscle weakness and muscle joint tightness, decreased cardiorespiratoy endurance, decreased midline orientation, decreased attention to left and decreased motor planning, decreased initiation, decreased attention, decreased awareness, decreased problem solving, decreased safety awareness, decreased memory and delayed processing and decreased sitting balance, decreased standing balance, decreased postural control, hemiplegia and decreased balance strategies and therefore will continue to benefit from skilled PT intervention to increase functional independence with mobility.  Patient progressing toward long term goals..  Continue plan of care.  PT Short Term Goals Week 2:  PT Short Term Goal 1 (Week 2): Pt will perform rolling R/L in bed with max assist of 1 PT Short Term Goal 1 - Progress (Week 2): Met PT Short Term Goal 2 (Week 2): Pt will perform  sit>supine with mod assist of 2 PT Short Term Goal 2 - Progress (Week 2): Met PT Short Term Goal 3 (Week 2): Pt will perform bed<>chair transfer with +2 max assist using equipment as needed PT Short Term Goal 3 - Progress (Week 2): Met PT Short Term Goal 4 (Week 2): Patient will complete sit <> stand with LRAD and MaxA x2 consistently PT Short Term Goal 4 - Progress (Week 2): Met PT Short Term Goal 5 (Week 2): Patient will maintain static sitting balance at midline with no more than ModA for at least 30s PT Short Term Goal 5 - Progress (Week 2): Met Week 3:  PT Short Term Goal 1 (Week 3): Patient will ambulate >63f with MaxA x2 and LRAD PT Short Term Goal 2 (Week 3): Patient will transfer bed <> wc with MaxA x1 and LRAD PT Short Term Goal 3 (Week 3): Patient will complete supine <> sit with ModA x1  Skilled Therapeutic Interventions/Progress Updates:   Patient received sitting up in wc, agreeable to PT. He continues to report "stiffness" in posterior neck- Lidocaine patch currently on. PT transporting patient in wc to therapy gym for time management and energy conservation. MaxA x2 slideboard transfer to therapy mat. Static sitting balance EOM with mirror for visual feedback on posture. Patient requiring multimodal cues to reach R UE laterally to prevent excessive pushing and LOB L. He was able to maintain static sitting balance with MinA/CGA for ~45s-171m at a time before becoming distracted in the gym and losing his balance to the L. Dynamic sitting balance completed reaching R to retrieve horseshoes. However, when returning back to midline, patient would overcorrect to the L with poor ability to return to midline without additional external cuing.  Patient completing sit <> stand x3 with 3-musketeers assist, MaxA x2. Patient with improving relative B hip extension, but continues to demonstrate forward trunk flexion and limited ability to engage B glutes to correct without external cuing. Patient also  persists with strong L LE flexor tone and slight adduction. Attempting sit <> stand using EVA walker with ModA x2 and patient able to maintain a more upright posture and relative midline due to input from walker- this allowed PT to achieve greater L knee extension in stance. Patient reporting that he had to use the bathroom. Slideboard transfer back to wc with MaxA x2. MaxA x2 slideboard transfer to bed and return supine. Patient incontinent of bladder in brief, but continued to void into urinal with TotalA for clothing management and placement. MaxA rolling in bed for bedlevel perihygiene and dressing. Patient remaining in bed, bed alarm on, call light within reach.     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 Restrictions Weight Bearing Restrictions: No   Therapy/Group: Individual Therapy  Debbora Dus 11/03/2020, 7:41 AM

## 2020-11-03 NOTE — Progress Notes (Signed)
PROGRESS NOTE   Subjective/Complaints: Working with therapy in gym Initially has no complaints When asked, therapy notes chronic neck and back pain and patient says he has been sleeping poorly at night  ROS: Denies CP, SOB, N/V/D, +insomnia  Objective:   No results found. No results for input(s): WBC, HGB, HCT, PLT in the last 72 hours. No results for input(s): NA, K, CL, CO2, GLUCOSE, BUN, CREATININE, CALCIUM in the last 72 hours.  Intake/Output Summary (Last 24 hours) at 11/03/2020 1458 Last data filed at 11/02/2020 1806 Gross per 24 hour  Intake 410 ml  Output --  Net 410 ml        Physical Exam: Vital Signs Blood pressure 135/66, pulse 83, temperature 97.6 F (36.4 C), temperature source Oral, resp. rate 18, weight 77.9 kg, SpO2 98 %. Gen: no distress, normal appearing HEENT: oral mucosa pink and moist, NCAT Cardio: Reg rate Chest: normal effort, normal rate of breathing Abd: soft, non-distended Extremities: No clubbing, cyanosis, or edema Skin: No evidence of breakdown, no evidence of ras Neuro: Alert Motor: RUE/RLE: 5/5 proximal distal LUE: RUE -/5 proximal distal LLE: 0/5 proximal and distal  Assessment/Plan: 1. Functional deficits which require 3+ hours per day of interdisciplinary therapy in a comprehensive inpatient rehab setting.  Physiatrist is providing close team supervision and 24 hour management of active medical problems listed below.  Physiatrist and rehab team continue to assess barriers to discharge/monitor patient progress toward functional and medical goals  Care Tool:  Bathing    Body parts bathed by patient: Left arm,Chest,Abdomen,Front perineal area,Right upper leg,Left upper leg,Face   Body parts bathed by helper: Buttocks,Right arm,Right lower leg,Left lower leg Body parts n/a: Right lower leg,Left lower leg,Right arm,Left arm,Chest,Abdomen   Bathing assist Assist Level: 2  Helpers     Upper Body Dressing/Undressing Upper body dressing   What is the patient wearing?: Pull over shirt    Upper body assist Assist Level: Maximal Assistance - Patient 25 - 49%    Lower Body Dressing/Undressing Lower body dressing      What is the patient wearing?: Incontinence brief,Pants     Lower body assist Assist for lower body dressing: Dependent - Patient 0%     Toileting Toileting    Toileting assist Assist for toileting: Maximal Assistance - Patient 25 - 49%     Transfers Chair/bed transfer  Transfers assist  Chair/bed transfer activity did not occur: Safety/medical concerns  Chair/bed transfer assist level: Total Assistance - Patient < 25% (squat pivot) Chair/bed transfer assistive device:  (squat pivot)   Locomotion Ambulation   Ambulation assist   Ambulation activity did not occur: Safety/medical concerns  Assist level: 2 helpers (+2 max/total A) Assistive device: Other (comment) (3 [place]) Max distance: 57ft   Walk 10 feet activity   Assist  Walk 10 feet activity did not occur: Safety/medical concerns        Walk 50 feet activity   Assist Walk 50 feet with 2 turns activity did not occur: Safety/medical concerns         Walk 150 feet activity   Assist Walk 150 feet activity did not occur: Safety/medical concerns  Walk 10 feet on uneven surface  activity   Assist Walk 10 feet on uneven surfaces activity did not occur: Safety/medical concerns         Wheelchair     Assist Will patient use wheelchair at discharge?: Yes (TBD but likely) Type of Wheelchair: Manual           Wheelchair 50 feet with 2 turns activity    Assist    Wheelchair 50 feet with 2 turns activity did not occur: Safety/medical concerns       Wheelchair 150 feet activity     Assist  Wheelchair 150 feet activity did not occur: Safety/medical concerns       Blood pressure 135/66, pulse 83, temperature 97.6 F  (36.4 C), temperature source Oral, resp. rate 18, weight 77.9 kg, SpO2 98 %.  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 Antithrombotics: -DVT/anticoagulation:SCDs -antiplatelet therapy: N/A 3. Pain management:Tylenol as needed  5/16: topamax 25mg  started at night for headache/insomnia. Lidocaine patch added for coccyx pain  No c/os  4. Mood/schizophrenia:Provide emotional support- was on Zyprexa 5 mg at home- will resume QHS which should help with sleep  -antipsychotic agents: continue Zyprexa 5mg  5. Neuropsych: This patientiscapable of making decisions on hisown behalf. 6. Skin/Wound Care:Routine skin checks 7. Fluids/Electrolytes/Nutrition:Routine in and outs with follow-up chemistries 8. Hypertension. Continue Norvasc 10mg  daily Vitals:   11/02/20 2010 11/03/20 0427  BP: (!) 146/75 135/66  Pulse: 90 83  Resp: 18 18  Temp: 99.5 F (37.5 C) 97.6 F (36.4 C)  SpO2: 100% 98%   Am elevation but not consistently  9.  Diabetes mellitus type 2 with hyperglycemia CBG (last 3)  Recent Labs    11/02/20 2127 11/03/20 0552 11/03/20 1330  GLUCAP 115* 77 259*   Increased novalog to 10U with meals   Continue Levemir  Some improvement but pm cbgs remain elevated- provided dietary education and made goal to replace grape juice with water  10. History of alcohol polysubstance use. Urine drug screen positive cocaine. Provide counseling 11. History of hepatitis C. Follow-up outpatient 12. L foot drop- suggest possible AFO, etc and PRAFO to reduce risk of L ankle contracture 13.   Hx of schizophrenia?, was in a  "recovery house" in [place] for a year after wife's death, states it was not drug rehab , increase Zyprexa, to  5mg ,saw a "Counselor " as OP denies seeing psychiatry  14.  Urinary freq , small amt will trial flomax, incont  15. Insomnia: start melatonin 3mg  HS.  LOS: 15 days A FACE TO FACE EVALUATION WAS PERFORMED  Clide Deutscher Apurva Reily 11/03/2020, 2:58 PM

## 2020-11-03 NOTE — Progress Notes (Signed)
Physical Therapy Session Note  Patient Details  Name: Lance [name] MRN: [id number] Date of Birth: May 28, 1946  Today's Date: 11/03/2020 PT Individual Time: 1000-1042 PT Individual Time Calculation (min): 42 min   Short Term Goals: Week 1:  PT Short Term Goal 1 (Week 1): Pt will perform rolling R/L in bed with max assist of 1 PT Short Term Goal 1 - Progress (Week 1): Progressing toward goal PT Short Term Goal 2 (Week 1): Pt will perform supine>sitting EOB with max assist of 2 PT Short Term Goal 2 - Progress (Week 1): Met PT Short Term Goal 3 (Week 1): Pt will perform sit>supine with mod assist of 2 PT Short Term Goal 3 - Progress (Week 1): Progressing toward goal PT Short Term Goal 4 (Week 1): Pt will perform bed<>chair transfer with +2 max assist using equipment as needed PT Short Term Goal 4 - Progress (Week 1): Progressing toward goal Week 2:  PT Short Term Goal 1 (Week 2): Pt will perform rolling R/L in bed with max assist of 1 PT Short Term Goal 2 (Week 2): Pt will perform sit>supine with mod assist of 2 PT Short Term Goal 3 (Week 2): Pt will perform bed<>chair transfer with +2 max assist using equipment as needed PT Short Term Goal 4 (Week 2): Patient will complete sit <> stand with LRAD and MaxA x2 consistently PT Short Term Goal 5 (Week 2): Patient will maintain static sitting balance at midline with no more than ModA for at least 30s  Skilled Therapeutic Interventions/Progress Updates:   Received pt sidelying in bed, pt agreeable to PT session, and reported 9/10 pain in posterior neck. RN notified and present to administer medication. Repositioning, rest breaks, and distraction done to reduce pain levels. Session with emphasis on toileting, bed mobility, dressing,  generalized strengthening, dynamic sitting balance/coordination, and improved activity tolerance. Pt declined getting OOB due to pain stating "I"m not ready to get up right now" but agreeable to getting dressed and ready  for the day. Checked to ensure brief was clean; pt found to be incontinent of urine. No +2 available during session. Pt rolled L/R with max A and use of bedrails and doffed dirty brief and performed peri-care dependently. Donned clean brief with total A and pants with max A. Pt able to lift RLE to thread through pants and use RUE to pull pants over hips but required total A on L side. Pt transferred supine<>sitting EOB with max A; cues to reach for footboard to prevent pushing to L. Pt able to maintain static sitting balance with as little as CGA. Doffed gown and donned clean pull over shirt with max A then transferred sit<>supine with light mod A for LE management. Scooted to [facility] with supervision pulling on headboard with use of [place] bed position. Pt then reported urge to urinate. Before therapist could set up urinal pt soiled brief/pants then reported actively having BM. Doffed soiled pants and brief in same manner listed above and performed peri-care dependently. Called RN to take over due to time restrictions. Concluded session with pt supine in bed with RN present attending to care.   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 Restrictions Weight Bearing Restrictions: No  Therapy/Group: Individual Therapy Alfonse Alpers PT, DPT   11/03/2020, 7:25 AM

## 2020-11-03 NOTE — Progress Notes (Signed)
Speech Language Pathology Weekly Progress and Session Note  Patient Details  Name: Lance [name] MRN: [id number] Date of Birth: 1946/05/09  Beginning of progress report period: 10/27/2020 End of progress report period: 11/03/2020  Today's Date: 11/03/2020 SLP Individual Time:  -     Short Term Goals: Week 2: SLP Short Term Goal 1 (Week 2): Patient will be able to name at least 5 items in a given category with maxA cues. SLP Short Term Goal 1 - Progress (Week 2): Met SLP Short Term Goal 2 (Week 2): Patient will answer comprehension questions after reading short (1-2 sentence length) instructions, with 75% accuracy and modA. SLP Short Term Goal 2 - Progress (Week 2): Progressing toward goal SLP Short Term Goal 3 (Week 2): Patient will complete functional tasks and simulated tasks ADL's with maxA cues for maintaining adequate sustained attention. SLP Short Term Goal 3 - Progress (Week 2): Met SLP Short Term Goal 4 (Week 2): Patient will orient to time and recall specific daily events related to therapeutic and nursing/medical interventions with modA cues. SLP Short Term Goal 4 - Progress (Week 2): Met    New Short Term Goals: Week 3: SLP Short Term Goal 1 (Week 3): Patient will perform divergent and convergent naming tasks with modA cues. SLP Short Term Goal 2 (Week 3): Patient will describe object function for familiar objects at phrase level with modA. SLP Short Term Goal 3 (Week 3): Patient will demonstrate adequate orientation to time using visual aids with mod. SLP Short Term Goal 4 (Week 3): Patient will perform basic level functional tasks with minA cues for maintaining attention and active performance. SLP Short Term Goal 5 (Week 3): Patient will demonstrate emergent-anticipatory awareness to deficits and impact, with modA cues.  Weekly Progress Updates:  Patient made good progress, meeting 3/4 STG's set at mod and maxA cues level. Headache pain, poor sleep were factors in  progress however this appears to have improved and patient more participatory and engaged in sessions. He continues with language and cognitive disorder resulting in poor awareness, delayed processing, language errors and decreased ability to complete problem solving tasks.    Intensity: Minumum of 1-2 x/day, 30 to 90 minutes Frequency: 3 to 5 out of 7 days Duration/Length of Stay: 6/10 Treatment/Interventions: Cognitive remediation/compensation;Medication managment;Cueing hierarchy;Patient/family education;Functional tasks;Speech/Language facilitation  Sonia Baller, MA, CCC-SLP Speech Therapy

## 2020-11-04 DIAGNOSIS — E119 Type 2 diabetes mellitus without complications: Secondary | ICD-10-CM

## 2020-11-04 LAB — GLUCOSE, CAPILLARY
Glucose-Capillary: 116 mg/dL — ABNORMAL HIGH (ref 70–99)
Glucose-Capillary: 55 mg/dL — ABNORMAL LOW (ref 70–99)
Glucose-Capillary: 145 mg/dL — ABNORMAL HIGH (ref 70–99)
Glucose-Capillary: 263 mg/dL — ABNORMAL HIGH (ref 70–99)
Glucose-Capillary: 243 mg/dL — ABNORMAL HIGH (ref 70–99)

## 2020-11-04 MED ORDER — INSULIN GLARGINE 100 UNIT/ML ~~LOC~~ SOLN
5.0000 [IU] | Freq: Every day | SUBCUTANEOUS | Status: DC
  Administered 2020-11-05 – 2020-11-06 (×2): 5 [IU] via SUBCUTANEOUS
  Filled 2020-11-04 (×3): qty 0.05

## 2020-11-04 MED ORDER — INSULIN DETEMIR 100 UNIT/ML ~~LOC~~ SOLN
17.0000 [IU] | Freq: Every day | SUBCUTANEOUS | Status: DC
  Administered 2020-11-04 – 2020-11-06 (×3): 17 [IU] via SUBCUTANEOUS
  Filled 2020-11-04 (×4): qty 0.17

## 2020-11-04 NOTE — Progress Notes (Signed)
Hypoglycemic Event  CBG: 55  Treatment: OJ; ensure  Symptoms: pt was hungry  Follow-up CBG: Time:1237 CBG Result:145  Possible Reasons for Event: meds  Comments/MD notified:followed protocol    Jillyn Ledger BSN ; RNC

## 2020-11-04 NOTE — Progress Notes (Signed)
PROGRESS NOTE   Subjective/Complaints: No new complaints. Tells me he slept ok last night. Pain controlled  ROS: Patient denies fever, rash, sore throat, blurred vision, nausea, vomiting, diarrhea, cough, shortness of breath or chest pain, joint or back pain, headache, or mood change.    Objective:   No results found. No results for input(s): WBC, HGB, HCT, PLT in the last 72 hours. No results for input(s): NA, K, CL, CO2, GLUCOSE, BUN, CREATININE, CALCIUM in the last 72 hours.  Intake/Output Summary (Last 24 hours) at 11/04/2020 1233 Last data filed at 11/04/2020 0800 Gross per 24 hour  Intake 741 ml  Output 300 ml  Net 441 ml        Physical Exam: Vital Signs Blood pressure 137/69, pulse 64, temperature 97.9 F (36.6 C), resp. rate 16, weight 77.9 kg, SpO2 100 %. Constitutional: No distress . Vital signs reviewed. HEENT: EOMI, oral membranes moist Neck: supple Cardiovascular: RRR without murmur. No JVD    Respiratory/Chest: CTA Bilaterally without wheezes or rales. Normal effort    GI/Abdomen: BS +, non-tender, non-distended Ext: no clubbing, cyanosis, or edema Psych: pleasant and cooperative Skin: No evidence of breakdown, no evidence of ras Neuro: Alert Motor: RUE/RLE: 5/5 proximal distal LUE: RUE tr /5 proximal distal LLE: 0/5 proximal and distal  Assessment/Plan: 1. Functional deficits which require 3+ hours per day of interdisciplinary therapy in a comprehensive inpatient rehab setting.  Physiatrist is providing close team supervision and 24 hour management of active medical problems listed below.  Physiatrist and rehab team continue to assess barriers to discharge/monitor patient progress toward functional and medical goals  Care Tool:  Bathing    Body parts bathed by patient: Left arm,Chest,Abdomen,Front perineal area,Right upper leg,Left upper leg,Face   Body parts bathed by helper: Buttocks,Right  arm,Right lower leg,Left lower leg Body parts n/a: Right lower leg,Left lower leg,Right arm,Left arm,Chest,Abdomen   Bathing assist Assist Level: 2 Helpers     Upper Body Dressing/Undressing Upper body dressing   What is the patient wearing?: Pull over shirt    Upper body assist Assist Level: Maximal Assistance - Patient 25 - 49%    Lower Body Dressing/Undressing Lower body dressing      What is the patient wearing?: Incontinence brief,Pants     Lower body assist Assist for lower body dressing: Dependent - Patient 0%     Toileting Toileting    Toileting assist Assist for toileting: Maximal Assistance - Patient 25 - 49%     Transfers Chair/bed transfer  Transfers assist  Chair/bed transfer activity did not occur: Safety/medical concerns  Chair/bed transfer assist level: 2 Helpers Chair/bed transfer assistive device: Sliding board   Locomotion Ambulation   Ambulation assist   Ambulation activity did not occur: Safety/medical concerns  Assist level: 2 helpers (+2 max/total A) Assistive device: Other (comment) (3 Muskteer) Max distance: 38ft   Walk 10 feet activity   Assist  Walk 10 feet activity did not occur: Safety/medical concerns        Walk 50 feet activity   Assist Walk 50 feet with 2 turns activity did not occur: Safety/medical concerns         Walk 150 feet  activity   Assist Walk 150 feet activity did not occur: Safety/medical concerns         Walk 10 feet on uneven surface  activity   Assist Walk 10 feet on uneven surfaces activity did not occur: Safety/medical concerns         Wheelchair     Assist Will patient use wheelchair at discharge?: Yes (TBD but likely) Type of Wheelchair: Manual           Wheelchair 50 feet with 2 turns activity    Assist    Wheelchair 50 feet with 2 turns activity did not occur: Safety/medical concerns       Wheelchair 150 feet activity     Assist  Wheelchair 150 feet  activity did not occur: Safety/medical concerns       Blood pressure 137/69, pulse 64, temperature 97.9 F (36.6 C), resp. rate 16, weight 77.9 kg, SpO2 100 %.  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 Antithrombotics: -DVT/anticoagulation:SCDs -antiplatelet therapy: N/A 3. Pain management:Tylenol as needed  5/16: topamax 25mg  started at night for headache/insomnia. Lidocaine patch added for coccyx pain  Pain control improved  4. Mood/schizophrenia:Provide emotional support- was on Zyprexa 5 mg at home- will resume QHS which should help with sleep  -antipsychotic agents: continue Zyprexa 5mg  5. Neuropsych: This patientiscapable of making decisions on hisown behalf. 6. Skin/Wound Care:Routine skin checks 7. Fluids/Electrolytes/Nutrition:Routine in and outs with follow-up chemistries 8. Hypertension. Continue Norvasc 10mg  daily Vitals:   11/03/20 1946 11/04/20 0520  BP: 125/68 137/69  Pulse: 79 64  Resp: 18 16  Temp: 98.5 F (36.9 C) 97.9 F (36.6 C)  SpO2: 98% 100%   BP controlled  9.  Diabetes mellitus type 2 with hyperglycemia CBG (last 3)  Recent Labs    11/03/20 2114 11/04/20 0552 11/04/20 1145  GLUCAP 290* 116* 55*   Increased novalog to 10U with meals   CBG"s all over the place  -reduce PM levemir and begin low dose am levemir to cover pm readings better 10. History of alcohol polysubstance use. Urine drug screen positive cocaine. Provide counseling 11. History of hepatitis C. Follow-up outpatient 12. L foot drop- suggest possible AFO, etc and PRAFO to reduce risk of L ankle contracture 13.   Hx of schizophrenia?, was in a "recovery house" in  [place] for a year after wife's death, states it was not drug rehab , increase Zyprexa, to  5mg ,saw a "Counselor " as OP denies seeing psychiatry  14.  Urinary freq , small amt will trial flomax, incont  15. Insomnia:  melatonin 3mg  HS with some improvement  .  LOS: 16 days A FACE TO [place] 11/04/2020, 12:33 PM

## 2020-11-05 LAB — URINALYSIS, COMPLETE (UACMP) WITH MICROSCOPIC
Bacteria, UA: NONE SEEN
Bilirubin Urine: NEGATIVE
Glucose, UA: >=500 mg/dL — AB
Hgb urine dipstick: NEGATIVE
Ketones, ur: NEGATIVE mg/dL
Leukocytes,Ua: NEGATIVE
Nitrite: NEGATIVE
Protein, ur: 30 mg/dL — AB
Specific Gravity, Urine: 1.02 (ref 1.005–1.030)
pH: 5 (ref 5.0–8.0)

## 2020-11-05 LAB — GLUCOSE, CAPILLARY
Glucose-Capillary: 168 mg/dL — ABNORMAL HIGH (ref 70–99)
Glucose-Capillary: 154 mg/dL — ABNORMAL HIGH (ref 70–99)
Glucose-Capillary: 58 mg/dL — ABNORMAL LOW (ref 70–99)
Glucose-Capillary: 260 mg/dL — ABNORMAL HIGH (ref 70–99)
Glucose-Capillary: 333 mg/dL — ABNORMAL HIGH (ref 70–99)

## 2020-11-05 NOTE — Progress Notes (Signed)
Hypoglycemic Event  CBG: 58  Treatment: glucerna; dinner  Symptoms: none  Follow-up CBG: Time:1835 CBG Result:268  Possible Reasons for Event: medication( possible)  Comments/MD notified:followed protocol    Lance [name]

## 2020-11-05 NOTE — Progress Notes (Signed)
PROGRESS NOTE   Subjective/Complaints: Pt rested fairly well. Denies pain. Tm 100.3 yesterday afternoon  ROS: Patient denies fever, rash, sore throat, blurred vision, nausea, vomiting, diarrhea, cough, shortness of breath or chest pain, joint or back pain, headache, or mood change.    Objective:   No results found. No results for input(s): WBC, HGB, HCT, PLT in the last 72 hours. No results for input(s): NA, K, CL, CO2, GLUCOSE, BUN, CREATININE, CALCIUM in the last 72 hours.  Intake/Output Summary (Last 24 hours) at 11/05/2020 0937 Last data filed at 11/05/2020 0740 Gross per 24 hour  Intake 840 ml  Output 300 ml  Net 540 ml        Physical Exam: Vital Signs Blood pressure 136/62, pulse 73, temperature 98.4 F (36.9 C), resp. rate 17, weight 77.9 kg, SpO2 97 %. Constitutional: No distress . Vital signs reviewed. HEENT: EOMI, oral membranes moist Neck: supple Cardiovascular: RRR without murmur. No JVD    Respiratory/Chest: CTA Bilaterally without wheezes or rales. Normal effort    GI/Abdomen: BS +, non-tender, non-distended Ext: no clubbing, cyanosis, or edema Psych: pleasant and cooperative Skin: No evidence of breakdown, no evidence of ras Neuro: Alert Motor: RUE/RLE: 5/5 proximal distal LUE: RUE tr /5 proximal distal LLE: 0/5 proximal and distal  Assessment/Plan: 1. Functional deficits which require 3+ hours per day of interdisciplinary therapy in a comprehensive inpatient rehab setting.  Physiatrist is providing close team supervision and 24 hour management of active medical problems listed below.  Physiatrist and rehab team continue to assess barriers to discharge/monitor patient progress toward functional and medical goals  Care Tool:  Bathing    Body parts bathed by patient: Left arm,Chest,Abdomen,Front perineal area,Right upper leg,Left upper leg,Face   Body parts bathed by helper: Buttocks,Right  arm,Right lower leg,Left lower leg Body parts n/a: Right lower leg,Left lower leg,Right arm,Left arm,Chest,Abdomen   Bathing assist Assist Level: 2 Helpers     Upper Body Dressing/Undressing Upper body dressing   What is the patient wearing?: Pull over shirt    Upper body assist Assist Level: Maximal Assistance - Patient 25 - 49%    Lower Body Dressing/Undressing Lower body dressing      What is the patient wearing?: Incontinence brief,Pants     Lower body assist Assist for lower body dressing: Dependent - Patient 0%     Toileting Toileting    Toileting assist Assist for toileting: Maximal Assistance - Patient 25 - 49%     Transfers Chair/bed transfer  Transfers assist  Chair/bed transfer activity did not occur: Safety/medical concerns  Chair/bed transfer assist level: 2 Helpers Chair/bed transfer assistive device: Sliding board   Locomotion Ambulation   Ambulation assist   Ambulation activity did not occur: Safety/medical concerns  Assist level: 2 helpers (+2 max/total A) Assistive device: Other (comment) (3 Muskteer) Max distance: 76ft   Walk 10 feet activity   Assist  Walk 10 feet activity did not occur: Safety/medical concerns        Walk 50 feet activity   Assist Walk 50 feet with 2 turns activity did not occur: Safety/medical concerns         Walk 150 feet activity  Assist Walk 150 feet activity did not occur: Safety/medical concerns         Walk 10 feet on uneven surface  activity   Assist Walk 10 feet on uneven surfaces activity did not occur: Safety/medical concerns         Wheelchair     Assist Will patient use wheelchair at discharge?: Yes (TBD but likely) Type of Wheelchair: Manual           Wheelchair 50 feet with 2 turns activity    Assist    Wheelchair 50 feet with 2 turns activity did not occur: Safety/medical concerns       Wheelchair 150 feet activity     Assist  Wheelchair 150 feet  activity did not occur: Safety/medical concerns       Blood pressure 136/62, pulse 73, temperature 98.4 F (36.9 C), resp. rate 17, weight 77.9 kg, SpO2 97 %.  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 Antithrombotics: -DVT/anticoagulation:SCDs -antiplatelet therapy: N/A 3. Pain management:Tylenol as needed  5/16: topamax 25mg  started at night for headache/insomnia. Lidocaine patch added for coccyx pain  Pain control improved  4. Mood/schizophrenia:Provide emotional support- was on Zyprexa 5 mg at home- will resume QHS which should help with sleep  -antipsychotic agents: continue Zyprexa 5mg  5. Neuropsych: This patientiscapable of making decisions on hisown behalf. 6. Skin/Wound Care:Routine skin checks 7. Fluids/Electrolytes/Nutrition:Routine in and outs with follow-up chemistries 8. Hypertension. Continue Norvasc 10mg  daily Vitals:   11/04/20 1945 11/05/20 0525  BP: 136/68 136/62  Pulse: 95 73  Resp: 17 17  Temp: 97.9 F (36.6 C) 98.4 F (36.9 C)  SpO2: 99% 97%   BP controlled  5/29 9.  Diabetes mellitus type 2 with hyperglycemia CBG (last 3)  Recent Labs    11/04/20 1628 11/04/20 2051 11/05/20 0612  GLUCAP 263* 243* 168*   5/29 novolog 10U with meals    - observe with reduced PM levemir (17u) and  low dose am levemir (5u)   -pt eating consistently 10. History of alcohol polysubstance use. Urine drug screen positive cocaine. Provide counseling 11. History of hepatitis C. Follow-up outpatient 12. L foot drop- suggest possible AFO, etc and PRAFO to reduce risk of L ankle contracture 13.   Hx of schizophrenia?, was in a "recovery house" in [place] for  a year after wife's death, states it was not drug rehab , increase Zyprexa, to  5mg ,saw a "Counselor " as OP denies seeing psychiatry  14.  Urinary freq , small amt will trial flomax, incont  5/29 low grade temp, check UA, UCX  15. Insomnia:  melatonin 3mg  HS with some improvement  .  LOS: 17 days A FACE TO [place] 11/05/2020, 9:37 AM

## 2020-11-05 NOTE — Plan of Care (Signed)
  Problem: RH BOWEL ELIMINATION Goal: RH STG MANAGE BOWEL WITH ASSISTANCE Description: STG Manage Bowel with mod Assistance. Outcome: Not Progressing; incontinence   Problem: RH BLADDER ELIMINATION Goal: RH STG MANAGE BLADDER WITH ASSISTANCE Description: STG Manage Bladder With min Assistance Outcome: Not Progressing; incontinence

## 2020-11-06 LAB — URINE CULTURE

## 2020-11-06 LAB — GLUCOSE, CAPILLARY
Glucose-Capillary: 130 mg/dL — ABNORMAL HIGH (ref 70–99)
Glucose-Capillary: 222 mg/dL — ABNORMAL HIGH (ref 70–99)
Glucose-Capillary: 163 mg/dL — ABNORMAL HIGH (ref 70–99)
Glucose-Capillary: 131 mg/dL — ABNORMAL HIGH (ref 70–99)

## 2020-11-06 MED ORDER — INSULIN DETEMIR 100 UNIT/ML ~~LOC~~ SOLN
5.0000 [IU] | Freq: Every day | SUBCUTANEOUS | Status: DC
  Administered 2020-11-07: 5 [IU] via SUBCUTANEOUS
  Filled 2020-11-06: qty 0.05

## 2020-11-06 NOTE — Progress Notes (Signed)
Speech Language Pathology Daily Session Note  Patient Details  Name: Lance [name] MRN: [id number] Date of Birth: Aug 17, 1945  Today's Date: 11/06/2020 SLP Individual Time: 4970-2637 SLP Individual Time Calculation (min): 40 min  Short Term Goals: Week 3: SLP Short Term Goal 1 (Week 3): Patient will perform divergent and convergent naming tasks with modA cues. SLP Short Term Goal 2 (Week 3): Patient will describe object function for familiar objects at phrase level with modA. SLP Short Term Goal 3 (Week 3): Patient will demonstrate adequate orientation to time using visual aids with mod. SLP Short Term Goal 4 (Week 3): Patient will perform basic level functional tasks with minA cues for maintaining attention and active performance. SLP Short Term Goal 5 (Week 3): Patient will demonstrate emergent-anticipatory awareness to deficits and impact, with modA cues.  Skilled Therapeutic Interventions: Skilled SLP intervention focused on cognition. Pt required moderate verbal cues for redirection to treatment tasks. He demonstrated adequate orientation to day of the week and month with mod A visual and verbal cues. Pt able to state 3 tasks he will need assistance to complete once home. Divergent naming task completed with mod A verbal cues to name >6 items for specific categories. Pt requested to speak with social worker at end of session and expressed concerns for plan with dc. Pt given explanation of plan with prescription medications and Social worker notified of patients request.  Pt transferred to bathroom with RN staff at end of session. Cont with therapy per plan of care.      Pain Pain Assessment Pain Scale: Faces Pain Score: 9  Faces Pain Scale: No hurt Pain Type: Acute pain Pain Location: Head Pain Descriptors / Indicators: Aching Pain Frequency: Intermittent Patients Stated Pain Goal: 2 Pain Intervention(s): Medication (See eMAR)  Therapy/Group: Individual Therapy  Darrol Poke  Ozie Lupe 11/06/2020, 9:16 AM

## 2020-11-06 NOTE — Progress Notes (Signed)
PROGRESS NOTE   Subjective/Complaints:  No further fevers , has neck pain at noc at times, thinks a lot about his deceased wife at night as well   ROS: Patient denies CP, SOB, N/V/D  Objective:   No results found. No results for input(s): WBC, HGB, HCT, PLT in the last 72 hours. No results for input(s): NA, K, CL, CO2, GLUCOSE, BUN, CREATININE, CALCIUM in the last 72 hours.  Intake/Output Summary (Last 24 hours) at 11/06/2020 1119 Last data filed at 11/06/2020 0740 Gross per 24 hour  Intake 1080 ml  Output 154 ml  Net 926 ml        Physical Exam: Vital Signs Blood pressure 131/90, pulse 82, temperature 97.9 F (36.6 C), resp. rate 20, weight 77.9 kg, SpO2 98 %.  General: No acute distress Mood and affect are appropriate Heart: Regular rate and rhythm no rubs murmurs or extra sounds Lungs: Clear to auscultation, breathing unlabored, no rales or wheezes Abdomen: Positive bowel sounds, soft nontender to palpation, nondistended Extremities: No clubbing, cyanosis, or edema Skin: No evidence of breakdown, no evidence of rash   Skin: No evidence of breakdown, no evidence of ras Neuro: Alert Motor: RUE/RLE: 5/5 proximal distal LUE: RUE tr /5 proximal distal LLE: 0/5 proximal and distal  Assessment/Plan: 1. Functional deficits which require 3+ hours per day of interdisciplinary therapy in a comprehensive inpatient rehab setting.  Physiatrist is providing close team supervision and 24 hour management of active medical problems listed below.  Physiatrist and rehab team continue to assess barriers to discharge/monitor patient progress toward functional and medical goals  Care Tool:  Bathing    Body parts bathed by patient: Left arm,Chest,Abdomen,Front perineal area,Right upper leg,Left upper leg,Face   Body parts bathed by helper: Buttocks,Right arm,Right lower leg,Left lower leg Body parts n/a: Right lower leg,Left  lower leg,Right arm,Left arm,Chest,Abdomen   Bathing assist Assist Level: 2 Helpers     Upper Body Dressing/Undressing Upper body dressing   What is the patient wearing?: Pull over shirt    Upper body assist Assist Level: Maximal Assistance - Patient 25 - 49%    Lower Body Dressing/Undressing Lower body dressing      What is the patient wearing?: Incontinence brief,Pants     Lower body assist Assist for lower body dressing: Dependent - Patient 0%     Toileting Toileting    Toileting assist Assist for toileting: Maximal Assistance - Patient 25 - 49%     Transfers Chair/bed transfer  Transfers assist  Chair/bed transfer activity did not occur: Safety/medical concerns  Chair/bed transfer assist level: 2 Helpers Chair/bed transfer assistive device: Sliding board   Locomotion Ambulation   Ambulation assist   Ambulation activity did not occur: Safety/medical concerns  Assist level: 2 helpers (+2 max/total A) Assistive device: Other (comment) (3 Muskteer) Max distance: 59ft   Walk 10 feet activity   Assist  Walk 10 feet activity did not occur: Safety/medical concerns        Walk 50 feet activity   Assist Walk 50 feet with 2 turns activity did not occur: Safety/medical concerns         Walk 150 feet activity   Assist  Walk 150 feet activity did not occur: Safety/medical concerns         Walk 10 feet on uneven surface  activity   Assist Walk 10 feet on uneven surfaces activity did not occur: Safety/medical concerns         Wheelchair     Assist Will patient use wheelchair at discharge?: Yes (TBD but likely) Type of Wheelchair: Manual           Wheelchair 50 feet with 2 turns activity    Assist    Wheelchair 50 feet with 2 turns activity did not occur: Safety/medical concerns       Wheelchair 150 feet activity     Assist  Wheelchair 150 feet activity did not occur: Safety/medical concerns       Blood  pressure 131/90, pulse 82, temperature 97.9 F (36.6 C), resp. rate 20, weight 77.9 kg, SpO2 98 %.  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 Antithrombotics: -DVT/anticoagulation:SCDs -antiplatelet therapy: N/A 3. Pain management:Tylenol as needed  5/16: topamax 25mg  started at night for headache/insomnia. Lidocaine patch added for coccyx pain  Pain control improved  4. Mood/schizophrenia:Provide emotional support- was on Zyprexa 5 mg at home- will resume QHS which should help with sleep  -antipsychotic agents: continue Zyprexa 5mg  5. Neuropsych: This patientiscapable of making decisions on hisown behalf. 6. Skin/Wound Care:Routine skin checks 7. Fluids/Electrolytes/Nutrition:Routine in and outs with follow-up chemistries 8. Hypertension. Continue Norvasc 10mg  daily Vitals:   11/05/20 1943 11/06/20 0404  BP: (!) 149/93 131/90  Pulse: 84 82  Resp: 20 20  Temp: 98.2 F (36.8 C) 97.9 F (36.6 C)  SpO2: 100% 98%   BP controlled  5/30 9.  Diabetes mellitus type 2 with hyperglycemia CBG (last 3)  Recent Labs    11/05/20 1826 11/05/20 2112 11/06/20 0600  GLUCAP 260* 333* 130*   5/29 novolog 10U with meals    - observe with reduced PM levemir (17u) and  low dose am levemir (5u)   -pt eating consistently CBG ok this an will monitor and adjust  10. History of alcohol polysubstance use. Urine drug screen positive cocaine. Provide counseling 11. History of hepatitis C. Follow-up outpatient 12. L foot drop- suggest possible AFO, etc and PRAFO to reduce risk of L ankle contracture 13.   Hx of schizophrenia?, was in a "recovery house" in [place] for a year after wife's  death, states it was not drug rehab , increase Zyprexa, to  5mg ,saw a "Counselor " as OP denies seeing psychiatry  14.  Urinary freq , small amt will trial flomax, incont  5/29 low grade tempx 1, negative  UA, UCX pnd 15. Insomnia:  melatonin 3mg  HS with some improvement  .  LOS: 18 days A FACE TO [place] 11/06/2020, 11:19 AM

## 2020-11-06 NOTE — Progress Notes (Signed)
Occupational Therapy Session Note  Patient Details  Name: Lance [name] MRN: [id number] Date of Birth: Nov 24, 1945  Today's Date: 11/06/2020 OT Individual Time: 3582-5189 OT Individual Time Calculation (min): 49 min    Short Term Goals: Week 3:  OT Short Term Goal 1 (Week 3): Pt will use the LUE with bathing tasks with no more than min assist to wash the RUE. OT Short Term Goal 2 (Week 3): Pt will complete toilet transfer to the drop arm commode with max assist squat pivot. OT Short Term Goal 3 (Week 3): Pt will donn pullover shirt in supported sitting with min assist. OT Short Term Goal 4 (Week 3): Pt will complete sit to stand for LB selfcare with max assist at the sink or from EOB.  Skilled Therapeutic Interventions/Progress Updates:    Pt in wheelchair to start session with therapist transporting him down to the ortho gym to work on standing with use of the standing frame.  He continues to demonstrate poor spatial awareness and pushing tendencies to the left.  He attempted to scoot to the [place] once the footrests were off without therapist assist and almost scooted out in to the floor as he was leaning back and to the right with the right hip almost off of the cushion.  Therapist re-directed him, however because of his limited awareness, he argued with therapist that he wasn't going to fall out of the chair.  Eventually, he was convinced to be repositioned and was scooted back with use of therapist and therapy tech.  Positioned standing frame sling with max assist and then had him complete intervals of 10 mins and 5 mins in the standing frame.  Mirror provided for feedback with max instructional cueing for him to maintain trunk and cervical extension while completing simple addition and subtraction problems.  He continued to demonstrate increased pushing to the right but less when given the cue to keep his right forearm on the table and not push up with his hand.  Finished session with transition  back to the room where he was left sitting up in the wheelchair with the call button and phone in reach and safety belts in place.  Pt aware that he is supposed to go home on 6/10 and wants to keep working as hard as he can.    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 Restrictions Weight Bearing Restrictions: No  Pain: Pain Assessment Pain Scale: Faces Faces Pain Scale: Hurts little more Pain Type: Acute pain Pain Location: Neck Pain Orientation: Posterior Pain Descriptors / Indicators: Discomfort ADL: See Care Tool Section for some details of mobility and selfcare  Therapy/Group: Individual Therapy  Bambi Fehnel,Ioan OTR/L 11/06/2020, 4:05 PM

## 2020-11-06 NOTE — Progress Notes (Signed)
Physical Therapy Session Note  Patient Details  Name: Lance [name] MRN: [id number] Date of Birth: 1946-01-16  Today's Date: 11/06/2020 PT Individual Time: 1010-1105 PT Individual Time Calculation (min): 55 min   Short Term Goals: Week 3:  PT Short Term Goal 1 (Week 3): Patient will ambulate >90ft with MaxA x2 and LRAD PT Short Term Goal 2 (Week 3): Patient will transfer bed <> wc with MaxA x1 and LRAD PT Short Term Goal 3 (Week 3): Patient will complete supine <> sit with ModA x1  Skilled Therapeutic Interventions/Progress Updates:    Pt received supine in bed and agreeable to therapy session. Supine>sitting L EOB, HOB slightly elevated and using bedrail, with max assist of 1 for trunk upright and pivoting hips to EOB. Pt demos awareness that he needs to lean onto R forearm for support to maintain sitting balance but becomes distracted by placing a pillow for him to put his forearm on resulting in 3x L LOB requiring total assist to maintain upright as pt with inability to divide attention and recognize LOB and initiate a balance recovery strategy instead of moving the pillow. Therapist cued for attention to this and educated pt on importance of prioritizing his sitting balance/trunk control over other tasks. Pt able to lean onto R forearm with CGA for safety while therapist placed transfer board under his L hip. L lateral scoot transfer EOB>TIS w/c using transfer board with total assist of 1 - in this quiet environment with no additional distractions pt able to safely focus on therapist's cuing to lean forward to off-weight hips for the transfer but continues to require total assist to scoot to the L as when pt attempts to assist he has poor motor planning scooting laterally and anteriorly to the R making him at risk for sliding forward into the floor.  Transported to/from gym in w/c for time management and energy conservation. Sit<>stands to/from TIS w/c using 3 Musketeer support with +2 min/mod  assist for lifting/lowering - pt has delayed trunk/hip extension to achieve upright requirng cuing for improvement - therapist facilitating increased L knee extension and weight shift for L LE NMR but pt demos activation of L glute and quad. Gait training ~4ft 3x with 3 Musketeer support - +2 max progressed to +2 mod assist for balance and to maintain upright posture as pt progresses into worsening crouched posture with trunk/hip flexion, mirror feedback helpful towards end of walk to improve upright posture when pt closer to the mirror - pt able to initiate L LE advancement during swing with good hip flexion and some hamstring activation but requires max assist to completely advance it ~90% of the time - pt demos activation of L glutes/quads during stance but therapist providing facilitation for increased knee extension to improve safety - pt continues to have some pusher tendencies keeping RLE excessively abducted. Transported back to room in w/c and pt agreeable to remain sitting upright. Left tilted back in TIS w/c with needs in reach, seat belt alarm on, L UE therapeutically positioned on pillows, and snack provided.  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   Therapy/Group: Individual Therapy  Tawana Scale , PT, DPT, CSRS  11/06/2020, 7:57 AM

## 2020-11-06 NOTE — Progress Notes (Signed)
Physical Therapy Session Note  Patient Details  Name: Lance [name] MRN: [id number] Date of Birth: 10-15-1945  Today's Date: 11/06/2020 PT Individual Time: 1500-1525 PT Individual Time Calculation (min): 25 min   Short Term Goals: Week 3:  PT Short Term Goal 1 (Week 3): Patient will ambulate >58ft with MaxA x2 and LRAD PT Short Term Goal 2 (Week 3): Patient will transfer bed <> wc with MaxA x1 and LRAD PT Short Term Goal 3 (Week 3): Patient will complete supine <> sit with ModA x1  Skilled Therapeutic Interventions/Progress Updates:    Patient received sitting up in wc, agreeable to PT. He denied pain when asked. He did report that he needed to use the urinal and was agreeable to attempting to use the urinal from the wc. Upon doffing patients pants below his hips, patient fond to have already been incontinent of urine. Patient transferring back to bed via slideboard transfer with MaxA x2 leading R. R arm blocked by PT to prevent pushing L/blocking progress of transfer. Patient returning supine with MaxA. Rolling R with CGA, rolling L with MaxA to complete bed level pericare. He was able to wash front periarea with supervision and verbal cues. PT donning new shorts with MaxA. Patient requesting assist to call daughter- remaining in bed, bed alarm on, call light within reach.   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

## 2020-11-07 LAB — GLUCOSE, CAPILLARY
Glucose-Capillary: 182 mg/dL — ABNORMAL HIGH (ref 70–99)
Glucose-Capillary: 74 mg/dL (ref 70–99)
Glucose-Capillary: 220 mg/dL — ABNORMAL HIGH (ref 70–99)
Glucose-Capillary: 158 mg/dL — ABNORMAL HIGH (ref 70–99)

## 2020-11-07 MED ORDER — INSULIN DETEMIR 100 UNIT/ML ~~LOC~~ SOLN
22.0000 [IU] | Freq: Every day | SUBCUTANEOUS | Status: DC
  Administered 2020-11-07 – 2020-11-11 (×5): 22 [IU] via SUBCUTANEOUS
  Filled 2020-11-07 (×6): qty 0.22

## 2020-11-07 NOTE — Progress Notes (Signed)
Patient ID: Lance [name], male   DOB: [birth year], 75 y.o.   MRN: [id number]   Pt daughter works during the week currently. Scheduled to work on Saturday 6/4. Family education scheduled with pt daughter Lance [name]) on Sunday, November 12, 2020. [address] [place]

## 2020-11-07 NOTE — Progress Notes (Signed)
Speech Language Pathology Daily Session Note  Patient Details  Name: Lance [name] MRN: [id number] Date of Birth: July 01, 1945  Today's Date: 11/07/2020 SLP Individual Time: 1415-1459 SLP Individual Time Calculation (min): 44 min  Short Term Goals: Week 3: SLP Short Term Goal 1 (Week 3): Patient will perform divergent and convergent naming tasks with modA cues. SLP Short Term Goal 2 (Week 3): Patient will describe object function for familiar objects at phrase level with modA. SLP Short Term Goal 3 (Week 3): Patient will demonstrate adequate orientation to time using visual aids with mod. SLP Short Term Goal 4 (Week 3): Patient will perform basic level functional tasks with minA cues for maintaining attention and active performance. SLP Short Term Goal 5 (Week 3): Patient will demonstrate emergent-anticipatory awareness to deficits and impact, with modA cues.  Skilled Therapeutic Interventions:Skilled ST services focused on cognitive skills. SLP facilitated education on current medication name/function/times per day. Pt demonstrated basic problem solving with medication x1 per day with QID pill organizer, however more complex medication 2-4 times per day required max A verbal cues for problem solving. Pt required min A verbal cues for error awareness in task. Pt was participatory in task, although tangential and reduced sustained attention but easily redirected. Pt was left in room with call bell within reach and chair alarm set. SLP recommends to continue skilled services.     Pain Pain Assessment Pain Score: 0-No pain  Therapy/Group: Individual Therapy  Kalub Morillo  [facility] 11/07/2020, 3:44 PM

## 2020-11-07 NOTE — Progress Notes (Signed)
PROGRESS NOTE   Subjective/Complaints:  No issues overnite, slept well discussed diabetic management   ROS: Patient denies CP, SOB, N/V/D  Objective:   No results found. No results for input(s): WBC, HGB, HCT, PLT in the last 72 hours. No results for input(s): NA, K, CL, CO2, GLUCOSE, BUN, CREATININE, CALCIUM in the last 72 hours.  Intake/Output Summary (Last 24 hours) at 11/07/2020 0835 Last data filed at 11/06/2020 1800 Gross per 24 hour  Intake 240 ml  Output --  Net 240 ml        Physical Exam: Vital Signs Blood pressure 124/75, pulse 66, temperature 98.3 F (36.8 C), temperature source Oral, resp. rate 18, weight 77.9 kg, SpO2 100 %.  General: No acute distress Mood and affect are appropriate Heart: Regular rate and rhythm no rubs murmurs or extra sounds Lungs: Clear to auscultation, breathing unlabored, no rales or wheezes Abdomen: Positive bowel sounds, soft nontender to palpation, nondistended Extremities: No clubbing, cyanosis, or edema    Skin: No evidence of breakdown, no evidence of rash Neuro: Alert oriented to person and place Motor: RUE/RLE: 5/5 proximal distal LUE: RUE tr /5 proximal distal LLE: 0/5 proximal and distal  Assessment/Plan: 1. Functional deficits which require 3+ hours per day of interdisciplinary therapy in a comprehensive inpatient rehab setting.  Physiatrist is providing close team supervision and 24 hour management of active medical problems listed below.  Physiatrist and rehab team continue to assess barriers to discharge/monitor patient progress toward functional and medical goals  Care Tool:  Bathing    Body parts bathed by patient: Left arm,Chest,Abdomen,Front perineal area,Right upper leg,Left upper leg,Face   Body parts bathed by helper: Buttocks,Right arm,Right lower leg,Left lower leg Body parts n/a: Right lower leg,Left lower leg,Right arm,Left arm,Chest,Abdomen    Bathing assist Assist Level: 2 Helpers     Upper Body Dressing/Undressing Upper body dressing   What is the patient wearing?: Pull over shirt    Upper body assist Assist Level: Maximal Assistance - Patient 25 - 49%    Lower Body Dressing/Undressing Lower body dressing      What is the patient wearing?: Incontinence brief,Pants     Lower body assist Assist for lower body dressing: Dependent - Patient 0%     Toileting Toileting    Toileting assist Assist for toileting: Maximal Assistance - Patient 25 - 49%     Transfers Chair/bed transfer  Transfers assist  Chair/bed transfer activity did not occur: Safety/medical concerns  Chair/bed transfer assist level: Total Assistance - Patient < 25% Chair/bed transfer assistive device: Sliding board   Locomotion Ambulation   Ambulation assist   Ambulation activity did not occur: Safety/medical concerns  Assist level: 2 helpers (+2 mod/max A) Assistive device: Other (comment) (3 Musketeer) Max distance: 78ft   Walk 10 feet activity   Assist  Walk 10 feet activity did not occur: Safety/medical concerns        Walk 50 feet activity   Assist Walk 50 feet with 2 turns activity did not occur: Safety/medical concerns         Walk 150 feet activity   Assist Walk 150 feet activity did not occur: Safety/medical concerns  Walk 10 feet on uneven surface  activity   Assist Walk 10 feet on uneven surfaces activity did not occur: Safety/medical concerns         Wheelchair     Assist Will patient use wheelchair at discharge?: Yes (TBD but likely) Type of Wheelchair: Manual           Wheelchair 50 feet with 2 turns activity    Assist    Wheelchair 50 feet with 2 turns activity did not occur: Safety/medical concerns       Wheelchair 150 feet activity     Assist  Wheelchair 150 feet activity did not occur: Safety/medical concerns       Blood pressure 124/75, pulse 66,  temperature 98.3 F (36.8 C), temperature source Oral, resp. rate 18, weight 77.9 kg, SpO2 100 %.  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 Antithrombotics: -DVT/anticoagulation:SCDs -antiplatelet therapy: N/A 3. Pain management:Tylenol as needed  5/16: topamax 25mg  started at night for headache/insomnia. Lidocaine patch added for coccyx pain  Pain control improved  4. Mood/schizophrenia:Provide emotional support- was on Zyprexa 5 mg at home- will resume QHS which should help with sleep  -antipsychotic agents: continue Zyprexa 5mg  5. Neuropsych: This patientiscapable of making decisions on hisown behalf. 6. Skin/Wound Care:Routine skin checks 7. Fluids/Electrolytes/Nutrition:Routine in and outs with follow-up chemistries 8. Hypertension. Continue Norvasc 10mg  daily Vitals:   11/06/20 1952 11/07/20 0425  BP: 136/79 124/75  Pulse: 99 66  Resp: (!) 21 18  Temp: 98.1 F (36.7 C) 98.3 F (36.8 C)  SpO2: 99% 100%   BP controlled  5/31 9.  Diabetes mellitus type 2 with hyperglycemia CBG (last 3)  Recent Labs    11/06/20 1612 11/06/20 2057 11/07/20 0557  GLUCAP 163* 131* 182*   5/29 novolog 10U with meals    - observe with reduced PM levemir (17u) and  low dose am levemir (5u)   -pt eating consistently CBG a bit elevated this am d/c am levimir and increase pm 10. History of alcohol polysubstance use. Urine drug screen positive cocaine. Provide counseling 11. History of hepatitis C. Follow-up outpatient 12. L foot drop- suggest possible AFO, etc and PRAFO to reduce risk of L ankle contracture 13.   Hx of schizophrenia?, was in a "recovery house" in [place] for a  year after wife's death, states it was not drug rehab , increase Zyprexa, to  5mg ,saw a "Counselor " as OP denies seeing psychiatry  14.  Urinary freq , small amt will trial flomax, incont  5/29 low grade tempx 1, negative  UA, UCX pnd 15. Insomnia:  melatonin 3mg  HS with some improvement  .  LOS: 19 days A FACE TO Vernon E Janace Decker 11/07/2020, 8:35 AM

## 2020-11-07 NOTE — Progress Notes (Signed)
Physical Therapy Session Note  Patient Details  Name: Lance [name] MRN: [id number] Date of Birth: Mar 06, 1946  Today's Date: 11/07/2020 PT Individual Time: 1315-1415 PT Individual Time Calculation (min): 60 min   Short Term Goals: Week 3:  PT Short Term Goal 1 (Week 3): Patient will ambulate >88ft with MaxA x2 and LRAD PT Short Term Goal 2 (Week 3): Patient will transfer bed <> wc with MaxA x1 and LRAD PT Short Term Goal 3 (Week 3): Patient will complete supine <> sit with ModA x1  Skilled Therapeutic Interventions/Progress Updates:    Pt received sitting in TIS w/c and agreeable to therapy session. Transported to/from gym in w/c for time management and energy conservation. L lateral scoot transfer TIS w/c>EOM using transfer board, requires total assist for board placement with max cuing to sustain R lateral trunk lean to off-weight L hip by having pt reach to external target with R hand, total assist of 1 (close +2 available for safety) to scoot laterally with max cuing to sustain R anterior trunk lean. Pt reports severe onset of L posterior neck pain - currently wearing lidocaine patch - notified Benjie Karvonen, RN for need of any additional medication administration as available per pt request. Sit>supine to R side with max assist for trunk descent and B LE management onto mat with max cuing to use R UE to assist with trunk descent for increased pt independence with task. Provided supine rest break for pain management. Returned to sitting EOM with max assist for L LE management off EOM and for trunk upright - continued cuing for use of R UE to assist with trunk control. Sitting EOM participated in trunk control tasks with L attention to visually scan and locate items on L side then reach slightly cross body with R UE to grasp item - pt demos some improving awareness of midline orientation and when starting to have L LOB - requires varying min to max assist to maintain upright depending on how far leaning  L outside BOS prior to pt initiating balance strategy. Unable to advance to sit<>stand transfers today due to no consistent +2 assist available. L lateral scoot transfer EOM>TIS w/c using transfer board with same assist as above. Transported back to room and pt agreeable to remain sitting up in TIS w/c - left tilted back with needs in reach, seat belt alarm on, and L UE therapeutically positioned on pillows.  Placed sign above pt's HOB to ensure TIS w/c tilted backwards when pt sitting up to ensure pt safety.   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   Therapy/Group: Individual Therapy  Tawana Scale , PT, DPT, CSRS  11/07/2020, 12:44 PM

## 2020-11-07 NOTE — Progress Notes (Signed)
Occupational Therapy Session Note  Patient Details  Name: Lance [name] MRN: [id number] Date of Birth: [birth year]  Today's Date: 11/07/2020 OT Individual Time: 5093-2671 OT Individual Time Calculation (min): 63 min    Short Term Goals: Week 3:  OT Short Term Goal 1 (Week 3): Pt will use the LUE with bathing tasks with no more than min assist to wash the RUE. OT Short Term Goal 2 (Week 3): Pt will complete toilet transfer to the drop arm commode with max assist squat pivot. OT Short Term Goal 3 (Week 3): Pt will donn pullover shirt in supported sitting with min assist. OT Short Term Goal 4 (Week 3): Pt will complete sit to stand for LB selfcare with max assist at the sink or from EOB.  Skilled Therapeutic Interventions/Progress Updates:    Pt received in room in bed and consented to OT tx. Pt's brief was soiled, req max A for brief change and hygiene. Instructed pt to complete LB bathing while in bed, pt able to wash tops of legs, req assist to wash lower legs. Total assist with urinal at bed level, Max A for LB dressing while in bed, pt able to bridge on R side to hike, req assist with L side. Pt req max A to sit EOB and min A for sitting balance. Pt able to hold onto bed rail to avoid leaning towards left side. Utilized Sara lift and 2 helpers to transition from EOB to TIS w/c. Pt assisted with UB dressing with max A and cuing for hemi dressing techs with poor carryover. Pt completed oral hygiene with setup and cuing for thoroughness and to wash toothpaste out of mouth/beard after oral care. Pt taken down to therapy gym and instructed in anterior weight shifting activity, along with functional reach to facilitate R side lean to improve sitting balance when unsupported. After tx, pt left up in TIS w/c, seatbelt alarm on and all needs met. RN notified of pt's 8/10 neck pain.   Therapy Documentation Precautions:  Precautions Precautions: Fall,Other (comment) Precaution Comments: L hemiplegia,  pusher to the left Restrictions Weight Bearing Restrictions: No Pain: Pain Assessment Pain Scale: 0-10 Pain Score: 8  Pain Type: Acute pain Pain Location: Neck Pain Orientation: Posterior Pain Descriptors / Indicators: Aching Pain Frequency: Constant Pain Onset: On-going Patients Stated Pain Goal: 4 Pain Intervention(s): Repositioned   Therapy/Group: Individual Therapy  Jhony Antrim 11/07/2020, 8:47 AM

## 2020-11-07 NOTE — Progress Notes (Signed)
Occupational Therapy Session Note  Patient Details  Name: Lance [name] MRN: [id number] Date of Birth: 06/13/1945  Today's Date: 11/07/2020 OT Individual Time: 1500-1530 OT Individual Time Calculation (min): 30 min    Short Term Goals: Week 3:  OT Short Term Goal 1 (Week 3): Pt will use the LUE with bathing tasks with no more than min assist to wash the RUE. OT Short Term Goal 2 (Week 3): Pt will complete toilet transfer to the drop arm commode with max assist squat pivot. OT Short Term Goal 3 (Week 3): Pt will donn pullover shirt in supported sitting with min assist. OT Short Term Goal 4 (Week 3): Pt will complete sit to stand for LB selfcare with max assist at the sink or from EOB.  Skilled Therapeutic Interventions/Progress Updates:    Pt received in room in w/c and consented to OT tx. Session focused on standing tolerance in Sara lift, midline orientation, and sitting balance. 2 helpers to get pt set up in Sara for safety as pt leans heavily to L side. Pt constantly cues for proper hand placement, body positioning, and upright posture during session. Pt sat back down via Sara at EOB with therapist mod A for unsupported sitting balance. Utilized mirror to facilitate midline orientation, however pt had to be continuously cued to look up at himself in mirror. Pt responded better to tactile cuing "touch your R shoulder to my left shoulder" as therapist sat next to him at EOB. Pt able to maintain for a few seconds at a time before leaning back to R side. Incorporated functional reach to R side to facilitate core strength and leaning towards R side. After tx, pt helped back to bed and left with all needs met and bed alarm on.   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 

## 2020-11-08 LAB — GLUCOSE, CAPILLARY
Glucose-Capillary: 119 mg/dL — ABNORMAL HIGH (ref 70–99)
Glucose-Capillary: 254 mg/dL — ABNORMAL HIGH (ref 70–99)
Glucose-Capillary: 179 mg/dL — ABNORMAL HIGH (ref 70–99)
Glucose-Capillary: 225 mg/dL — ABNORMAL HIGH (ref 70–99)

## 2020-11-08 MED ORDER — TRAMADOL HCL 50 MG PO TABS: 50.0000 mg | ORAL_TABLET | Freq: Two times a day (BID) | ORAL | Status: DC | PRN

## 2020-11-08 NOTE — Progress Notes (Signed)
PROGRESS NOTE   Subjective/Complaints:  No issues overnite except neck pain only partially relieved by tylenol   ROS: Patient denies CP, SOB, N/V/D  Objective:   No results found. No results for input(s): WBC, HGB, HCT, PLT in the last 72 hours. No results for input(s): NA, K, CL, CO2, GLUCOSE, BUN, CREATININE, CALCIUM in the last 72 hours.  Intake/Output Summary (Last 24 hours) at 11/08/2020 0939 Last data filed at 11/08/2020 0700 Gross per 24 hour  Intake 680 ml  Output --  Net 680 ml        Physical Exam: Vital Signs Blood pressure 129/70, pulse 82, temperature 98.3 F (36.8 C), temperature source Oral, resp. rate 20, weight 77.9 kg, SpO2 100 %.  General: No acute distress Mood and affect are appropriate Heart: Regular rate and rhythm no rubs murmurs or extra sounds Lungs: Clear to auscultation, breathing unlabored, no rales or wheezes Abdomen: Positive bowel sounds, soft nontender to palpation, nondistended Extremities: No clubbing, cyanosis, or edema Skin: No evidence of breakdown, no evidence of rash Neuro: Alert oriented to person and place Motor: RUE/RLE: 5/5 proximal distal LUE: RUE tr /5 proximal distal LLE: 0/5 proximal and distal  Assessment/Plan: 1. Functional deficits which require 3+ hours per day of interdisciplinary therapy in a comprehensive inpatient rehab setting.  Physiatrist is providing close team supervision and 24 hour management of active medical problems listed below.  Physiatrist and rehab team continue to assess barriers to discharge/monitor patient progress toward functional and medical goals  Care Tool:  Bathing    Body parts bathed by patient: Left arm,Chest,Abdomen,Front perineal area,Right upper leg,Left upper leg,Face   Body parts bathed by helper: Buttocks,Right arm,Right lower leg,Left lower leg Body parts n/a: Right lower leg,Left lower leg,Right arm,Left  arm,Chest,Abdomen   Bathing assist Assist Level: 2 Helpers     Upper Body Dressing/Undressing Upper body dressing   What is the patient wearing?: Pull over shirt    Upper body assist Assist Level: Maximal Assistance - Patient 25 - 49%    Lower Body Dressing/Undressing Lower body dressing      What is the patient wearing?: Incontinence brief,Pants     Lower body assist Assist for lower body dressing: Total Assistance - Patient < 25%     Toileting Toileting    Toileting assist Assist for toileting: Maximal Assistance - Patient 25 - 49%     Transfers Chair/bed transfer  Transfers assist  Chair/bed transfer activity did not occur: Safety/medical concerns  Chair/bed transfer assist level: Total Assistance - Patient < 25% Chair/bed transfer assistive device: Sliding board   Locomotion Ambulation   Ambulation assist   Ambulation activity did not occur: Safety/medical concerns  Assist level: 2 helpers (+2 mod/max A) Assistive device: Other (comment) (3 Musketeer) Max distance: 27ft   Walk 10 feet activity   Assist  Walk 10 feet activity did not occur: Safety/medical concerns        Walk 50 feet activity   Assist Walk 50 feet with 2 turns activity did not occur: Safety/medical concerns         Walk 150 feet activity   Assist Walk 150 feet activity did not occur: Safety/medical concerns  Walk 10 feet on uneven surface  activity   Assist Walk 10 feet on uneven surfaces activity did not occur: Safety/medical concerns         Wheelchair     Assist Will patient use wheelchair at discharge?: Yes (TBD but likely) Type of Wheelchair: Manual           Wheelchair 50 feet with 2 turns activity    Assist    Wheelchair 50 feet with 2 turns activity did not occur: Safety/medical concerns       Wheelchair 150 feet activity     Assist  Wheelchair 150 feet activity did not occur: Safety/medical concerns       Blood  pressure 129/70, pulse 82, temperature 98.3 F (36.8 C), temperature source Oral, resp. rate 20, weight 77.9 kg, SpO2 100 %.  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 Antithrombotics: -DVT/anticoagulation:SCDs -antiplatelet therapy: N/A 3. Pain management:Tylenol as needed  5/16: topamax 25mg  started at night for headache/insomnia. Lidocaine patch added for coccyx pain  Pain control improved  4. Mood/schizophrenia:Provide emotional support- was on Zyprexa 5 mg at home- will resume QHS which should help with sleep  -antipsychotic agents: continue Zyprexa 5mg  5. Neuropsych: This patientiscapable of making decisions on hisown behalf. 6. Skin/Wound Care:Routine skin checks 7. Fluids/Electrolytes/Nutrition:Routine in and outs with follow-up chemistries 8. Hypertension. Continue Norvasc 10mg  daily Vitals:   11/07/20 1952 11/08/20 0515  BP: (!) 119/55 129/70  Pulse: 73 82  Resp: 19 20  Temp: 98.1 F (36.7 C) 98.3 F (36.8 C)  SpO2: 99% 100%   BP controlled  6/1 9.  Diabetes mellitus type 2 with hyperglycemia CBG (last 3)  Recent Labs    11/07/20 1621 11/07/20 2137 11/08/20 0605  GLUCAP 220* 158* 119*   5/29 novolog 10U with meals     CBG improved Levimir 22U qhs  10. History of alcohol polysubstance use. Urine drug screen positive cocaine. Provide counseling 11. History of hepatitis C. Follow-up outpatient 12. L foot drop- suggest possible AFO, etc and PRAFO to reduce risk of L ankle contracture 13.   Hx of schizophrenia?, was in a "recovery house" in [place] for a year after wife's death, states it was not drug rehab , increase Zyprexa, to  5mg ,saw a "Counselor  " as OP denies seeing psychiatry  14.  Urinary freq , small amt will trial flomax, incont  5/29 low grade tempx 1, negative  UA, UCX pnd 15. Insomnia:  melatonin 3mg  HS with some improvement  .  LOS: 20 days A FACE TO [place] E Dazani Norby 11/08/2020, 9:39 AM

## 2020-11-08 NOTE — Progress Notes (Signed)
Physical Therapy Session Note  Patient Details  Name: Lance [name] MRN: [id number] Date of Birth: [birth year]  Today's Date: 11/08/2020 PT Individual Time: 1020-1100 and 1702-1740 PT Individual Time Calculation (min): 40 min and 38 min  Short Term Goals: Week 3:  PT Short Term Goal 1 (Week 3): Patient will ambulate >64ft with MaxA x2 and LRAD PT Short Term Goal 2 (Week 3): Patient will transfer bed <> wc with MaxA x1 and LRAD PT Short Term Goal 3 (Week 3): Patient will complete supine <> sit with ModA x1  Skilled Therapeutic Interventions/Progress Updates:    Session 1: Pt received L sidelying in bed, HOB elevated eating graham crackers. Pt agreeable to therapy session. Therapist educated pt on D/C plan and DME recommendations as follows: pt requiring 24hr physical assistance with need for hospital bed, manual hoyer lift, and custom TIS wheelchair - educated pt on current plan for [place] on Sunday 6/5 to review this information with his daughter, Lance [name], and plan to schedule custom wheelchair consultation early next week. Pt in agreement with these recommendations but concerned that his son-in-law is not supportive of his daughter being his primary caregiver - therapist provided emotional support and encouraged him to continue conversations with his family. Pt noted to be incontinent of bladder. Rolling L in bed using bedrail with min assist and R with max assist during total assist LB clothing management and peri-care. Supine>sitting L EOB via logroll with heavy max assist for trunk upright and min assist for L LE management off bed - continues to demo very rigid/stiff trunk. Sitting EOB, pt continues to demos some improving midline orientation/awareness with improving ability to recognize when he is starting to have L LOB but lacks motor planning and initiation to implement balance recovery strategy unless LOB is only slight (also has very rigid trunk limiting ability to weight shift). Sitting EOB  with mod assist of 1 to prevent L LOB during total assist for threading pants on LEs. Sit>stand EOB>3 Musketeer support with +2 mod assist for lifting to stand - when provided increased time able to come to stand with decreased assist - total assist to pull pants up over hips. Educated pt on proper use and positioning of transfer board to complete transfer from EOB>w/c. L lateral scoot EOB>TIS w/c using transfer board, total assist for board placement, and max assist of 1 to lift and pivot hips with +2 min assist for safety - repeated cuing to sustain R anterior trunk lean/weight shift during transfer - with cuing pt able to initiate lifting hips from bed to assist with transfer - demos some improving motor planning to perform L lateral scoot. At end of session, pt left tilted back in TIS w/c with needs in reach, seat belt alarm on, and L UE therapeutically positioned on a pillow.  Session 2: Pt received sitting in TIS w/c and agreeable to therapy session. Therapy session focused on block-practice bed<>chair and sit<>stand transfers along with sitting balance and awareness of when L trunk LOB occurring. Therapist reinforced earlier education regarding proper transfer board placement with pt having difficulty recalling this information.  L lateral scoot transfer w/c>EOB using transfer board, total assist for board placement, max assist of 1 for scooting with +2 stabilizing transfer board for safety - required several scoots to complete transfer - repeated cuing for R anterior trunk lean/weight shift with therapist facilitating pt leaning onto R forearm on w/c armrest to sustain trunk position - pt demos improving motor planning to perform L  lateral scoot as opposed to previously scooting R hip forward causing his hips to slide forward.  R squat pivot transfer EOB>TIS w/c with max assist of 1 and +2 assist intermittently providing facilitation to sustain anterior trunk lean/weight shift - pt able to push through B  LEs to lift hips slightly from bed during transfer but difficulty moving hips towards R as pt pushing with R UE, cuing with some improvement in this noted.  L squat pivot TIS w/c>EOB with max assist of 1 for lifting/pivoting hips - repeated cuing to lean R with forearm support on R w/c armrest to promote forward/R lateral trunk lean/weight shift - pt able to lift hips slightly for clearance and continues to demo some improvements in motor planning to scoot hips towards L.  Sit<>stands to/from EOB using 3 Musketeer support x3reps with +2 min/mod assist for lifting to stand and balance - demos slow, gradual progression into standing with posterior lean (backs of legs pushing on bed) and insufficient hip extension for full upright posture.  While sitting on EOB pt has L trunk LOB onto bed while distracted in conversation and reaching for something with R UE. Sit>supine via reverse logroll with max assist for B LE management onto bed.  Pt noted to be incontinent of bladder. Supine rolling L using bedrail with min assist and rolling R with mod/max assist during total assist LB clothing management and peri-care.  Pt left supine in bed with HOB maximally elevated, meal tray set-up, and bed alarm on.  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 Restrictions Weight Bearing Restrictions: No  Pain: Session 1: Continues to report L posterior neck pain, unrated, - provided repositioning, distraction, and adjustment of therapist positioning and facilitation for pain management.  Session 2: Continues to report L posterior neck pain - provided repositioning, distraction, and adjustment of facilitation techniques for pain management.   Therapy/Group: Individual Therapy  Tawana Scale , PT, DPT, CSRS  11/08/2020, 7:58 AM

## 2020-11-08 NOTE — Progress Notes (Signed)
Patient ID: Lance [name], male   DOB: February 10, 1946, 75 y.o.   MRN: [id number] Team Conference Report to Patient/Family  Team Conference discussion was reviewed with the patient and caregiver, including goals, any changes in plan of care and target discharge date.  Patient and caregiver express understanding and are in agreement.  The patient has a target discharge date of 11/17/20.  SW met with pt. Called pt daughter, no answer- left VM. SW will cont to follow up [name] 11/08/2020, 1:44 PM

## 2020-11-08 NOTE — Progress Notes (Signed)
Occupational Therapy Session Note  Patient Details  Name: Lance [name] MRN: [id number] Date of Birth: [birth year]  Today's Date: 11/08/2020 OT Individual Time: 7412-8786 OT Individual Time Calculation (min): 60 min    Short Term Goals: Week 3:  OT Short Term Goal 1 (Week 3): Pt will use the LUE with bathing tasks with no more than min assist to wash the RUE. OT Short Term Goal 2 (Week 3): Pt will complete toilet transfer to the drop arm commode with max assist squat pivot. OT Short Term Goal 3 (Week 3): Pt will donn pullover shirt in supported sitting with min assist. OT Short Term Goal 4 (Week 3): Pt will complete sit to stand for LB selfcare with max assist at the sink or from EOB.  Skilled Therapeutic Interventions/Progress Updates:    Worked on bathing at shower level this session with dressing from the tilt in space wheelchair.  He was able to complete transfer into the shower on the tub bench with total +2 with use of the [place].  Decreased ability throughout session, in and out of the [employer], to achieve upright posture during all sit to stand transitions.  He also would exhibit increased pushing to the left side while being flexed at the head, neck, and trunk.  Total assist +2 for all LOB clothing management as well as dressing.  He needed mod assist for sitting balance on the BSC during bathing with increased pushing and LOB to the left.  Pt exhibits no awareness that he is pushing himself to the left.  Min hand over hand assist needed with use of the LUE when washing the RUE. Did not attempt sit to stand with bathing secondary to safety issues.  Pt was unable to stand up on the [place] to get out of the shower as he could not get to midline and could not extend his hips high enough and extend his trunk to a position to be able to flip the seat of the [place] out.  Had him complete stand pivot transfer with total +2 (pt 20%) to the wheelchair.  Max assist for UB dressing with max assist also for  threading brief and pants over his LEs following hemi technique.  He was able to complete sit to stand with total +2 (pt 20%) for pulling items over hips.  Finished session with pt in the tilt in space wheelchair with the call button and phone in reach with safety belt in place.     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 Restrictions Weight Bearing Restrictions: No  Pain: Pain Assessment Pain Scale: Faces Faces Pain Scale: Hurts a little bit Pain Type: Acute pain Pain Location: Neck Pain Orientation: Posterior Pain Descriptors / Indicators: Aching;Discomfort Pain Onset: With Activity Pain Intervention(s): Repositioned;Medication (See eMAR) ADL: See Care Tool Section for some details of mobility and selfcare  Therapy/Group: Individual Therapy  Aradhana Gin,Azzan OTR/L 11/08/2020, 4:21 PM

## 2020-11-08 NOTE — Patient Care Conference (Addendum)
Inpatient RehabilitationTeam Conference and Plan of Care Update Date: 11/08/2020   Time: 10:54 AM    Patient Name: Lance [name]      Medical Record Number: [id number]  Date of Birth: 03-Nov-1945 Sex: Male         Room/Bed: 4W24C/4W24C-01 Payor Info: Payor: [employer] MEDICARE / Plan: [place] HMO / Product Type: *No Product type* /    Admit Date/Time:  10/19/2020  1:15 PM  Primary Diagnosis:  Intraparenchymal hemorrhage of brain [facility])  Hospital Problems: Principal Problem:   Intraparenchymal hemorrhage of brain ([place]) Active Problems:   Controlled type 2 diabetes mellitus with hyperglycemia [facility])   Essential hypertension   Vascular headache   Spastic hemiparesis [facility])    Expected Discharge Date: Expected Discharge Date: 11/17/20  Team Members Present: Physician leading conference: Dr. Alysia Penna Care Coodinator Present: Erlene Quan, BSW;Danaly Bari Hervey Ard, RN, BSN, [place] Nurse Present: Dorien Chihuahua, RN PT Present: Estevan Ryder, PT OT Present: Clyda Greener, OT SLP Present: Junius Argyle, SLP PPS Coordinator present : Gunnar Fusi, SLP     Current Status/Progress Goal Weekly Team Focus  Bowel/Bladder             Swallow/Nutrition/ Hydration             ADL's   min A for UB bathing, mod A for UB dressing when supported. Setup/SUP for oral hygiene seated sink side. Max A for LB ADLs at bed level  min to mod assist  sef care retraining with hemi dressing techniques, transfer training, NMR, education, ther ex, ther at, midline orientation, balance   Mobility   max assist of 1 sit<>supine, total assist of 1-2 for lateral scoot transfers using transfer board, +2 mod/max assist sit<>stand via 3 Musketeer support, +2 max A gait up to 39ft via 3 Musketeer support (3rd person w/c follow); continues to demo significant L inattention, impaired midline orientation, and poor focused/sustained attention to tasks though improving; along with significant L hemiplegia  (though pt now able to initiate lifting L LE during w/c management and demos activation of quad/glutes and hip flexors during gait training)  mod assist overall, will monitor if needing to be downgraded based on pt progress  activity tolerance, midline orientation, pt awareness, bed mobility training, transfer training, sitting balance/trunk control, L UE and L LE NMR, standing balance and tolerance, gait training, pt education   Communication   Min-mod A  supervision basic, minA mild complex  following commands and word finding   Safety/Cognition/ Behavioral Observations  Min A basic, Mod-max A mild  supervision basic, minA mild complex  attention, problem solving and awareness   Pain             Skin               Discharge Planning:  pt to d/c home with dtrs to provide care   Team Discussion: Patient is tangential, neck pain with left neglect; keeps head turned, MD added tramadol. BP controlled. Patient on target to meet rehab goals: Currently min guard assist unsupported sitting. - total assist for left lean. Requires mod - max assist for upper body bathing. Gait with three musketeer support. Progress limited by left inattention, poor sustained attention, and focus plus flexed neck, flexed posturing; little progress noted. Discharge goals set for mod assist.  *See Care Plan and progress notes for long and short-term goals.   Revisions to Treatment Plan:   Teaching Needs: Transfers, toileting, medication management, secondary stroke risk management, etc.  Current Barriers to Discharge: Decreased caregiver support, Home enviroment access/layout and Incontinence  Possible Resolutions to Barriers: Family education set for 11/12/20; add additional sessions with identified family support     Medical Summary Current Status: CBG control improving , BP control improving , tone increase on LEFT LE>UE  Barriers to Discharge: Medical stability   Possible Resolutions to Barriers/Weekly  Focus: adjust pain meds,spasticity management , increase midline awareness   Continued Need for Acute Rehabilitation Level of Care: The patient requires daily medical management by a physician with specialized training in physical medicine and rehabilitation for the following reasons: Direction of a multidisciplinary physical rehabilitation program to maximize functional independence : Yes Medical management of patient stability for increased activity during participation in an intensive rehabilitation regime.: Yes Analysis of laboratory values and/or radiology reports with any subsequent need for medication adjustment and/or medical intervention. : Yes   I attest that I was present, lead the team conference, and concur with the assessment and plan of the team.   Dorien Chihuahua B 11/08/2020, 3:10 PM

## 2020-11-08 NOTE — Discharge Instructions (Addendum)
Inpatient Rehab Discharge Instructions  Lance [name] Discharge date and time: No discharge date for patient encounter.   Activities/Precautions/ Functional Status: Activity: activity as tolerated Diet: diabetic diet Wound Care: Routine skin checks Functional status:  ___ No restrictions     ___ Walk up steps independently ___ 24/7 supervision/assistance   ___ Walk up steps with assistance ___ Intermittent supervision/assistance  ___ Bathe/dress independently ___ Walk with walker     _x__ Bathe/dress with assistance ___ Walk Independently    ___ Shower independently ___ Walk with assistance    ___ Shower with assistance ___ No alcohol     ___ Return to work/school ________ COMMUNITY REFERRALS UPON DISCHARGE:    Home Health:   [place]    Medical Equipment/Items Ordered: Hospital Bed, Engineer, drilling, Air cabin crew                                                  Agency/Supplier: [employer]    Special Instructions:  No driving smoking alcohol or illicit drug use  My questions have been answered and I understand these instructions. I will adhere to these goals and the provided educational materials after my discharge from the hospital.  Patient/Caregiver Signature _______________________________ Date __________  Clinician Signature _______________________________________ Date __________  Please bring this form and your medication list with you to all your follow-up doctor's appointments.

## 2020-11-08 NOTE — Progress Notes (Signed)
Speech Language Pathology Daily Session Note  Patient Details  Name: SHIRL LUDINGTON MRN: [id number] Date of Birth: [birth year]  Today's Date: 11/08/2020 SLP Individual Time: 8295-6213 SLP Individual Time Calculation (min): 42 min  Short Term Goals: Week 3: SLP Short Term Goal 1 (Week 3): Patient will perform divergent and convergent naming tasks with modA cues. SLP Short Term Goal 2 (Week 3): Patient will describe object function for familiar objects at phrase level with modA. SLP Short Term Goal 3 (Week 3): Patient will demonstrate adequate orientation to time using visual aids with mod. SLP Short Term Goal 4 (Week 3): Patient will perform basic level functional tasks with minA cues for maintaining attention and active performance. SLP Short Term Goal 5 (Week 3): Patient will demonstrate emergent-anticipatory awareness to deficits and impact, with modA cues.  Skilled Therapeutic Interventions:Skilled ST services focused on cognitive skills. SLP facilitated mildly complex problem solving, selective attention and error awareness skills in calendar task. Pt required max A verbal cues for problem solving but with step by step directions reduced to mod A verbal cues. Pt's problem solving skills were impacted by internal distractions and required max A verbal cues for error awareness. Pt was left in room with call bell within reach and bed alarm set. SLP recommends to continue skilled services.     Pain Pain Assessment Pain Scale: 0-10 Pain Score: 0-No pain Pain Type: Acute pain Pain Location: Neck Pain Descriptors / Indicators: Aching Patients Stated Pain Goal: 4 Pain Intervention(s): Repositioned  Therapy/Group: Individual Therapy  Zanaiya Calabria  [facility] 11/08/2020, 11:24 AM

## 2020-11-09 LAB — GLUCOSE, CAPILLARY
Glucose-Capillary: 98 mg/dL (ref 70–99)
Glucose-Capillary: 119 mg/dL — ABNORMAL HIGH (ref 70–99)
Glucose-Capillary: 303 mg/dL — ABNORMAL HIGH (ref 70–99)
Glucose-Capillary: 123 mg/dL — ABNORMAL HIGH (ref 70–99)

## 2020-11-09 NOTE — Progress Notes (Signed)
Patient ID: Lance [name], male   DOB: May 19, 1946, 75 y.o.   MRN: [id number]   [facility] Bed ordered through [place].  [place], [place]

## 2020-11-09 NOTE — Progress Notes (Signed)
PROGRESS NOTE   Subjective/Complaints:  Pt took a shower yesterday   ROS: Patient denies CP, SOB, N/V/D  Objective:   No results found. No results for input(s): WBC, HGB, HCT, PLT in the last 72 hours. No results for input(s): NA, K, CL, CO2, GLUCOSE, BUN, CREATININE, CALCIUM in the last 72 hours.  Intake/Output Summary (Last 24 hours) at 11/09/2020 0807 Last data filed at 11/08/2020 1812 Gross per 24 hour  Intake 360 ml  Output --  Net 360 ml        Physical Exam: Vital Signs Blood pressure 117/85, pulse 72, temperature 98.3 F (36.8 C), resp. rate 18, weight 77.9 kg, SpO2 100 %.   General: No acute distress Mood and affect are appropriate Heart: Regular rate and rhythm no rubs murmurs or extra sounds Lungs: Clear to auscultation, breathing unlabored, no rales or wheezes Abdomen: Positive bowel sounds, soft nontender to palpation, nondistended Extremities: No clubbing, cyanosis, or edema Skin: No evidence of breakdown, no evidence of rash  Neuro: Alert oriented to person and place Motor: RUE/RLE: 5/5 proximal distal LUE: RUE tr /5 proximal distal LLE: 0/5 proximal and distal  Assessment/Plan: 1. Functional deficits which require 3+ hours per day of interdisciplinary therapy in a comprehensive inpatient rehab setting.  Physiatrist is providing close team supervision and 24 hour management of active medical problems listed below.  Physiatrist and rehab team continue to assess barriers to discharge/monitor patient progress toward functional and medical goals  Care Tool:  Bathing    Body parts bathed by patient: Chest,Left arm,Abdomen,Front perineal area,Right upper leg,Left upper leg,Face   Body parts bathed by helper: Right arm,Buttocks,Left lower leg,Right lower leg Body parts n/a: Right lower leg,Left lower leg,Right arm,Left arm,Chest,Abdomen   Bathing assist Assist Level: Maximal Assistance - Patient 24  - 49%     Upper Body Dressing/Undressing Upper body dressing   What is the patient wearing?: Pull over shirt    Upper body assist Assist Level: Maximal Assistance - Patient 25 - 49%    Lower Body Dressing/Undressing Lower body dressing      What is the patient wearing?: Incontinence brief,Pants     Lower body assist Assist for lower body dressing: 2 Helpers     Toileting Toileting    Toileting assist Assist for toileting: Maximal Assistance - Patient 25 - 49%     Transfers Chair/bed transfer  Transfers assist  Chair/bed transfer activity did not occur: Safety/medical concerns  Chair/bed transfer assist level: Maximal Assistance - Patient 25 - 49% Chair/bed transfer assistive device: Sliding board   Locomotion Ambulation   Ambulation assist   Ambulation activity did not occur: Safety/medical concerns  Assist level: 2 helpers (+2 mod/max A) Assistive device: Other (comment) (3 Musketeer) Max distance: 69ft   Walk 10 feet activity   Assist  Walk 10 feet activity did not occur: Safety/medical concerns        Walk 50 feet activity   Assist Walk 50 feet with 2 turns activity did not occur: Safety/medical concerns         Walk 150 feet activity   Assist Walk 150 feet activity did not occur: Safety/medical concerns  Walk 10 feet on uneven surface  activity   Assist Walk 10 feet on uneven surfaces activity did not occur: Safety/medical concerns         Wheelchair     Assist Will patient use wheelchair at discharge?: Yes (TBD but likely) Type of Wheelchair: Manual           Wheelchair 50 feet with 2 turns activity    Assist    Wheelchair 50 feet with 2 turns activity did not occur: Safety/medical concerns       Wheelchair 150 feet activity     Assist  Wheelchair 150 feet activity did not occur: Safety/medical concerns       Blood pressure 117/85, pulse 72, temperature 98.3 F (36.8 C), resp. rate 18,  weight 77.9 kg, SpO2 100 %.  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 Antithrombotics: -DVT/anticoagulation:SCDs -antiplatelet therapy: N/A 3. Pain management:Tylenol as needed  5/16: topamax 25mg  started at night for headache/insomnia. Lidocaine patch added for coccyx pain  Pain control improved  4. Mood/schizophrenia:Provide emotional support- was on Zyprexa 5 mg at home- will resume QHS which should help with sleep  -antipsychotic agents: continue Zyprexa 5mg  5. Neuropsych: This patientiscapable of making decisions on hisown behalf. 6. Skin/Wound Care:Routine skin checks 7. Fluids/Electrolytes/Nutrition:Routine in and outs with follow-up chemistries 8. Hypertension. Continue Norvasc 10mg  daily Vitals:   11/08/20 2027 11/09/20 0351  BP: 126/80 117/85  Pulse: 91 72  Resp: 16 18  Temp: 98.5 F (36.9 C) 98.3 F (36.8 C)  SpO2: 99% 100%   BP controlled  6/2 9.  Diabetes mellitus type 2 with hyperglycemia CBG (last 3)  Recent Labs    11/08/20 1639 11/08/20 2121 11/09/20 0639  GLUCAP 179* 225* 98   5/29 novolog 10U with meals     CBG improved Levimir 22U qhs  10. History of alcohol polysubstance use. Urine drug screen positive cocaine. Provide counseling 11. History of hepatitis C. Follow-up outpatient 12. L foot drop- suggest possible AFO, etc and PRAFO to reduce risk of L ankle contracture 13.   Hx of schizophrenia?, was in a "recovery house" in [place] for a year after wife's death, states it was not drug rehab , increase Zyprexa, to  5mg ,saw a "Counselor " as OP denies seeing psychiatry  14.  Urinary freq , small amt will trial flomax, incont  5/29  low grade tempx 1, negative  UA, UCX multispecies 15. Insomnia:  melatonin 3mg  HS with some improvement  .  LOS: 21 days A FACE TO FACE EVALUATION WAS PERFORMED  Charlett Blake 11/09/2020, 8:07 AM

## 2020-11-09 NOTE — Progress Notes (Signed)
Speech Language Pathology Daily Session Note  Patient Details  Name: GREGGORY SAFRANEK MRN: [id number] Date of Birth: 11-21-1945  Today's Date: 11/09/2020 SLP Individual Time: 0920-1020 SLP Individual Time Calculation (min): 60 min  Short Term Goals: Week 3: SLP Short Term Goal 1 (Week 3): Patient will perform divergent and convergent naming tasks with modA cues. SLP Short Term Goal 2 (Week 3): Patient will describe object function for familiar objects at phrase level with modA. SLP Short Term Goal 3 (Week 3): Patient will demonstrate adequate orientation to time using visual aids with mod. SLP Short Term Goal 4 (Week 3): Patient will perform basic level functional tasks with minA cues for maintaining attention and active performance. SLP Short Term Goal 5 (Week 3): Patient will demonstrate emergent-anticipatory awareness to deficits and impact, with modA cues.  Skilled Therapeutic Interventions: Skilled SLP intervention focused on cognition. Pt completed divergent naming task with min-mod A for redirection and repetition of items in categories he already stated. Pt continues to require redirection in conversation due to getting distracted by his own thoughts. He was able to recall 2 tasks completed in previous session with PT and rehab tech. Pt oriented to date and day of the week with monthly calendar and min visual cues. Cont with therapy per plan of care..     Pain Pain Assessment Pain Scale: Faces Faces Pain Scale: No hurt  Therapy/Group: Individual Therapy  Darrol Poke Jo Cerone 11/09/2020, 10:19 AM

## 2020-11-09 NOTE — Progress Notes (Signed)
Occupational Therapy Session Note  Patient Details  Name: Lance [name] MRN: [id number] Date of Birth: Dec 19, 1945  Today's Date: 11/09/2020 OT Individual Time: 3009-2330 OT Individual Time Calculation (min): 30 min    Short Term Goals: Week 3:  OT Short Term Goal 1 (Week 3): Pt will use the LUE with bathing tasks with no more than min assist to wash the RUE. OT Short Term Goal 2 (Week 3): Pt will complete toilet transfer to the drop arm commode with max assist squat pivot. OT Short Term Goal 3 (Week 3): Pt will donn pullover shirt in supported sitting with min assist. OT Short Term Goal 4 (Week 3): Pt will complete sit to stand for LB selfcare with max assist at the sink or from EOB.  Skilled Therapeutic Interventions/Progress Updates:    Pt in bed to start, initially stating he wasn't doing anymore therapy.  Both therapist and therapy tech persuaded him to participate and just sit on the side of the bed, which he agreed to complete.  He was able to maintain static sitting balance with supervision.  He remained hyperverbal throughout sitting and would constantly get internally distracted by his thoughts, requiring mod instructional cueing to focus on balance and transitions to standing.  He was able to complete sit to stand X2 for 45-60 seconds using three muskateers technique.  Increased cervical and trunk flexion noted in standing with pt reporting increased neck pain while limited his standing tolerance and required him to sit down.  Finished session with squat pivot transfer to the left to the wheelchair with total assist +2 (pt 30%).  He was left with the call button and phone in reach with the safety belt in place.    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 Restrictions Weight Bearing Restrictions: No  Pain: Pain Assessment Pain Scale: Faces Pain Score: 0-No pain Faces Pain Scale:  Hurts a little bit Pain Type: Acute pain Pain Location: Neck Pain Orientation: Posterior Pain Descriptors / Indicators: Discomfort Pain Onset: With Activity Pain Intervention(s): Repositioned ADL: See Care Tool Section for some details of mobility and selfcare  Therapy/Group: Individual Therapy  Aymara Sassi,Tavarion OTR/L 11/09/2020, 4:39 PM

## 2020-11-09 NOTE — Progress Notes (Signed)
Physical Therapy Session Note  Patient Details  Name: Lance [name] MRN: [id number] Date of Birth: 29-Mar-1946  Today's Date: 11/09/2020 PT Individual Time: [phone] and [id number] PT Individual Time Calculation (min): 58 min and 44 min  Short Term Goals: Week 3:  PT Short Term Goal 1 (Week 3): Patient will ambulate >34ft with MaxA x2 and LRAD PT Short Term Goal 2 (Week 3): Patient will transfer bed <> wc with MaxA x1 and LRAD PT Short Term Goal 3 (Week 3): Patient will complete supine <> sit with ModA x1  Skilled Therapeutic Interventions/Progress Updates:    Session 1: Pt received supine in bed and agreeable to therapy session. RN in/out for morning medications. Therapist reinforced pt education regarding D/C plan of pt needing a hospital bed and needing his daughter to set him up with a bedroom on the 1st floor as pt continues to talk about his bed at home and talking about his daughter's bedrooms being on the 2nd floor. Therapy session focused on trunk control/sitting balance and improving awareness of when L LOB is occurring to initiate a balance recovery strategy opposed to pt relying on total assist from therapist to remain upright. Supine>sitting L EOB, HOB partially elevated and using bedrail, with max assist for trunk upright and min assist for L LE management. Sitting EOB donned shirt with significantly increased time and mod/max assist with repeated cuing to lean R onto forearm support if pt starts having L LOB - pt had 3x L LOB back onto the bed during this task with therapist reinforcing education on returning to his "safe" position of R lateral trunk lean onto forearm support. Sitting EOB donned his pants with max assist for threading LEs and cuing for pt to pull pants up over knees - has 2x L LOB during this task with reinforcement of above education. Sit>stand EOB>3 Musketeer support 3x with +2 mod assist for lifting to stand - on 1st stand pt demoing more severe L pushing with R LE  that improved - repeated cuing for increased hip extension to achieve upright posture - on 3rd stand able to progress to R HHA and L UE support around thearpist's shoulders and maintain static standing balance for ~30seconds. MD in/out for morning assessment - discussed pt's L hip external rotation tightness impacting trunk control in sitting.  Block practice squat pivot transfers EOB>TIS w/c 3x to R and 2x w/c>EOB towards L - EOB>TIS to R: pt demos improved ability to use R UE to assist with pulling and pivoting hips towards w/c (as opposed to pushing) requires max assist of 1 and +2 min assist for safety and to pivot hips - continues to require cuing to sustain anterior trunk flexion and therapist heavily assisting with trunk control to prevent total L LOB during this  - TIS>EOB to L: continued cuing for carryover of putting R elbow support on w/c armrest to facilitating R lateral trunk lean and cuing/manual facilitation for sustained anterior trunk flexion with heavy max assist for lifting/pivoting hips to L  At end of session pt left sitting tilted back in TIS w/c with needs in reach, seat belt alarm on, and L UE therapeutically positioned on pillow.   Session 2: Pt received sitting in TIS w/c and agreeable to therapy session.  Transported to/from gym in w/c for time management and energy conservation. R squat pivot transfer w/c>EOM with max assist of 1 (+2 close for safety) - continued cuing for carryover of above education/training with pt demoing some increased  pushing during this transfer but still improving - continues to require max assist for trunk control to prevent L LOB and cuing to sustain anterior trunk lean. Pt noted to be incontinent of bladder. Sit<>stand to/from EOM with 3 Musketeer support and +2 mod assist for balance due to L lean/pushing - pt continues to demo activation of L LE glute/quads to initiate extension coming to stand but therapist blocking and facilitating further extension  for improved upright posture. Standing with mod assist of 1 due to L lean and +2 min assist during total assist LB clothing management and peri-care. During unsupported, seated rest breaks reinforced earlier education regarding importance of leaning onto R forearm support to maintain balance for safety. Gait training ~33ft x2 with 3 Musketeer support and +2 mod assist - pt demos increasing L glute/quad activation during stance resulting in increasing R LE step length - continues to have ability to initiate L LE swing phase though requires assist to complete step 100% of the time - did not require 3rd person for w/c follow at this time as pt able to safely turn and sit on mat with +2 assist. Transported back to room and pt requesting to return to bed. L squat pivot w/c>EOB, pt able to recall educ from earlier session to lean R forearm onto w/c armrest to sustain R trunk lean and then cuing for anterior trunk lean as well, then cuing to push through B LEs just a little, like he is going to come to stand allowing hip clearance during transfer - max assist primarily for pivoting hips and maintaining trunk control. Sit>supine max assist for B LE management. Pt left supine in bed with needs in reach, HOB elevated, pillow placed under L hip to avoid hip external rotation position, L UE therapeutically positioned on pillows, and bed alarm on.  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 Restrictions Weight Bearing Restrictions: No  Pain: Session 1: Continues to report some L posterior neck pain, unrated, but not as significant as yesterday - provided repositioning and distraction for pain management.  Session 2: Continues to report some L posterior neck pain, unrated, but less complaints than even this morning - repositioning for pain management.   Therapy/Group: Individual Therapy  Tawana Scale , PT, DPT,  CSRS  11/09/2020, 7:46 AM

## 2020-11-09 NOTE — Plan of Care (Signed)
  Problem: RH Tub/Shower Transfers Goal: LTG Patient will perform tub/shower transfers w/assist (OT) Description: LTG: Patient will perform tub/shower transfers with assist, with/without cues using equipment (OT) Outcome: Not Applicable Flowsheets (Taken 11/09/2020 1743) LTG: Pt will perform tub/shower stall transfers with assistance level of: (goal discharged) --

## 2020-11-09 NOTE — Progress Notes (Signed)
Occupational Therapy Weekly Progress Note  Patient Details  Name: Lance [name] MRN: [id number] Date of Birth: 23-Dec-1945  Beginning of progress report period: Nov 04, 2020 End of progress report period: November 10, 2020  Today's Date: 11/09/2020 OT Individual Time: 0350-0938 OT Individual Time Calculation (min): 30 min    Patient has met 1 of 4 short term goals.  Lance [name] continues to make slower progress than expected with completion of selfcare tasks, attention, awareness, balance, transfers, and LUE strengthening and functional use.  He currently is able to sit statically EOB with supervision to min guard assist but this transitions to max assist when any dynamic task is presented such as bathing or dressing.  Decreased spatial orientation with increased pusher tendencies to the left are present at a moderate level.  He continues to need overall mod to max assist for UB selfcare in unsupported sitting with min assist for UB bathing when supported in the tilt in space wheelchair.  Dressing apraxia is present with the need for mod demonstrational cueing along with max assist to donn a pullover shirt.  LB selfcare tasks are currently total assist +2 (pt 25%) when completed sit to stand or max to total assist of one when completed in supine rolling.  He currently needs min assist for rolling to the left with max assist for rolling to the right.  Left neglect is also still present as he will not attempt to use the left hand at times, requiring cueing for integration, with Brunnstrum stage III movement noted in the arm and stage V in the hand.  Motor planning deficits make it difficult to consistently use, requiring min to mod assist at times for washing the right arm or applying deodorant.  Lance [name] continues to require total +2 (pt 25%) for squat pivot transfers to the wheelchair secondary to increased pushing to the left.  Because of his current need for total +2 assist, feel at home he will  likely have to perform most selfcare and toileting tasks in bed for safety because of his increased pushing to the left and decreased ability to correct without max assist.  Sustained attention continues to be limited as well, requiring mod instructional cueing to complete selfcare tasks for short intervals.   Family education is currently planned starting 6/5 with expected discharge on 6/10.  Feel he will continue to benefit from continued CIR level therapy to help increase independence in preparation for discharge home with family.    Patient continues to demonstrate the following deficits: muscle weakness and muscle paralysis, impaired timing and sequencing, unbalanced muscle activation, motor apraxia, decreased coordination and decreased motor planning, field cut, decreased midline orientation, decreased attention to left and left side neglect, decreased attention, decreased awareness, decreased problem solving, decreased safety awareness, decreased memory and delayed processing and decreased sitting balance, decreased standing balance, decreased postural control, hemiplegia and decreased balance strategies and therefore will continue to benefit from skilled OT intervention to enhance overall performance with BADL and Reduce care partner burden.  Patient not progressing toward long term goals.  See goal revision..  Continue plan of care.  OT Short Term Goals Week 4:  OT Short Term Goal 1 (Week 4): Continue working on established LTGs in prep for discharge.  Skilled Therapeutic Interventions/Progress Updates:    Pt in wheelchair scooted out to the edge to start session with NT present.  Worked on re-positioning in the wheelchair with max demonstrational cueing for forward trunk flexion in order to scoot  back in the chair.  He continues to demonstrate increased pushing to the left, so tried to give him a target for him to reach forward on the right to help with weightshift for scooting.  He was then  transferred back to the bed at max assist level squat pivot to the left with max assist for transfer to supine.  Had him then focus on LUE neuromuscular re-education from bed level.  Had him work on towel slides with use of the sliding board and washcloth under the hand.  Mod assist for simple shoulder flexion with max instructional cueing for sustained visual attention.  Also had him complete functional reach with mod assist to take paper towel from therapist in different positions with emphasis on shoulder flexion and shoulder abduction.  Finished session with pt in the bed and with the call button and phone in reach with safety alarm in place.    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 Restrictions Weight Bearing Restrictions: No   Pain: Pain Assessment Pain Scale: 0-10 Pain Score: 8  Faces Pain Scale: Hurts a little bit Pain Type: Acute pain Pain Location: Neck Pain Orientation: Mid Pain Radiating Towards: head Pain Descriptors / Indicators: Aching Pain Frequency: Constant Pain Onset: With Activity Patients Stated Pain Goal: 3 Pain Intervention(s): Medication (See eMAR);Repositioned ADL: See Care Tool Section for some details of mobility and selfcare  Therapy/Group: Individual Therapy  Ladarrius Bogdanski,Cyle OTR/L 11/09/2020, 5:27 PM

## 2020-11-10 LAB — GLUCOSE, CAPILLARY
Glucose-Capillary: 183 mg/dL — ABNORMAL HIGH (ref 70–99)
Glucose-Capillary: 129 mg/dL — ABNORMAL HIGH (ref 70–99)
Glucose-Capillary: 244 mg/dL — ABNORMAL HIGH (ref 70–99)
Glucose-Capillary: 197 mg/dL — ABNORMAL HIGH (ref 70–99)

## 2020-11-10 NOTE — Progress Notes (Signed)
Occupational Therapy Session Note  Patient Details  Name: Lance [name] MRN: [id number] Date of Birth: Jul 29, 1945  Today's Date: 11/10/2020 OT Individual Time: 4818-5631 OT Individual Time Calculation (min): 54 min    Short Term Goals: Week 3:  OT Short Term Goal 1 (Week 3): Pt will use the LUE with bathing tasks with no more than min assist to wash the RUE. OT Short Term Goal 1 - Progress (Week 3): Met OT Short Term Goal 2 (Week 3): Pt will complete toilet transfer to the drop arm commode with max assist squat pivot. OT Short Term Goal 2 - Progress (Week 3): Not met OT Short Term Goal 3 (Week 3): Pt will donn pullover shirt in supported sitting with min assist. OT Short Term Goal 3 - Progress (Week 3): Not met OT Short Term Goal 4 (Week 3): Pt will complete sit to stand for LB selfcare with max assist at the sink or from EOB. OT Short Term Goal 4 - Progress (Week 3): Not met  Skilled Therapeutic Interventions/Progress Updates:     Pt received seated in TIS with rehab tech present, c/o neck pain but reports being premedicated, agreeable to therapy. Session focus on anterior WS + unsupported sitting balance + attention to task + LUE NMR/WB in prep for improved ADL +  func mobility performance. Pt becoming tearful at start of session when relating a story about his family. Pt upset with his SIL who  Per pt is unwilling to help upon DC. Extensive conservation and therapeutic use of self utilized to remind pt of his current rehab progress + potential + the importance of focusing on his health and recovery to improve outcomes. Pt noted to become easily internally distracted with tangential topics. Completed 1 game of checkers tilted back in TIS to encourage core strength and TIS - req min to mod A throughout to facilitate and maintain trunk flexion. Noted improved sustained attention to task and appropriate game play. Additionally attempted to complete forward towel slide, but req max A to maintain  LUE on towel.   Pt left tilted back in TIS with safety belt alarm engaged, call bell in reach, and all immediate needs met.   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 Restrictions Weight Bearing Restrictions: No Pain:   see session note ADL: See Care Tool for more details.   Therapy/Group: Individual Therapy  Volanda Napoleon MS, OTR/L  11/10/2020, 6:58 AM

## 2020-11-10 NOTE — Progress Notes (Signed)
PROGRESS NOTE   Subjective/Complaints:  No issues overnite but finds L PRAFO uncomfortable   ROS: Patient denies CP, SOB, N/V/D  Objective:   No results found. No results for input(s): WBC, HGB, HCT, PLT in the last 72 hours. No results for input(s): NA, K, CL, CO2, GLUCOSE, BUN, CREATININE, CALCIUM in the last 72 hours.  Intake/Output Summary (Last 24 hours) at 11/10/2020 0749 Last data filed at 11/10/2020 0745 Gross per 24 hour  Intake 1416 ml  Output 800 ml  Net 616 ml        Physical Exam: Vital Signs Blood pressure 136/78, pulse 70, temperature 98.5 F (36.9 C), resp. rate 16, weight 77.9 kg, SpO2 99 %.  General: No acute distress Mood and affect are appropriate Heart: Regular rate and rhythm no rubs murmurs or extra sounds Lungs: Clear to auscultation, breathing unlabored, no rales or wheezes Abdomen: Positive bowel sounds, soft nontender to palpation, nondistended Extremities: No clubbing, cyanosis, or edema Skin: No evidence of breakdown, no evidence of rash  Neuro: Alert oriented to person and place Motor: RUE/RLE: 5/5 proximal distal LUE: RUE tr /5 proximal distal LLE: 0/5 proximal and distal  Assessment/Plan: 1. Functional deficits which require 3+ hours per day of interdisciplinary therapy in a comprehensive inpatient rehab setting.  Physiatrist is providing close team supervision and 24 hour management of active medical problems listed below.  Physiatrist and rehab team continue to assess barriers to discharge/monitor patient progress toward functional and medical goals  Care Tool:  Bathing    Body parts bathed by patient: Chest,Left arm,Abdomen,Front perineal area,Right upper leg,Left upper leg,Face   Body parts bathed by helper: Right arm,Buttocks,Left lower leg,Right lower leg Body parts n/a: Right lower leg,Left lower leg,Right arm,Left arm,Chest,Abdomen   Bathing assist Assist Level:  Maximal Assistance - Patient 24 - 49%     Upper Body Dressing/Undressing Upper body dressing   What is the patient wearing?: Pull over shirt    Upper body assist Assist Level: Maximal Assistance - Patient 25 - 49%    Lower Body Dressing/Undressing Lower body dressing      What is the patient wearing?: Incontinence brief,Pants     Lower body assist Assist for lower body dressing: 2 Helpers     Toileting Toileting    Toileting assist Assist for toileting: Maximal Assistance - Patient 25 - 49%     Transfers Chair/bed transfer  Transfers assist  Chair/bed transfer activity did not occur: Safety/medical concerns  Chair/bed transfer assist level: 2 Helpers (max A of 1 and +2 for safety via squat pivot) Chair/bed transfer assistive device: Sliding board   Locomotion Ambulation   Ambulation assist   Ambulation activity did not occur: Safety/medical concerns  Assist level: 2 helpers (+2 mod A) Assistive device: Other (comment) (3 Musketeer) Max distance: 58ft   Walk 10 feet activity   Assist  Walk 10 feet activity did not occur: Safety/medical concerns        Walk 50 feet activity   Assist Walk 50 feet with 2 turns activity did not occur: Safety/medical concerns         Walk 150 feet activity   Assist Walk 150 feet activity did  not occur: Safety/medical concerns         Walk 10 feet on uneven surface  activity   Assist Walk 10 feet on uneven surfaces activity did not occur: Safety/medical concerns         Wheelchair     Assist Will patient use wheelchair at discharge?: Yes (TBD but likely) Type of Wheelchair: Manual           Wheelchair 50 feet with 2 turns activity    Assist    Wheelchair 50 feet with 2 turns activity did not occur: Safety/medical concerns       Wheelchair 150 feet activity     Assist  Wheelchair 150 feet activity did not occur: Safety/medical concerns       Blood pressure 136/78, pulse 70,  temperature 98.5 F (36.9 C), resp. rate 16, weight 77.9 kg, SpO2 99 %.  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 Antithrombotics: -DVT/anticoagulation:SCDs -antiplatelet therapy: N/A 3. Pain management:Tylenol as needed  5/16: topamax 25mg  started at night for headache/insomnia. Lidocaine patch added for coccyx pain  Pain control improved  4. Mood/schizophrenia:Provide emotional support- was on Zyprexa 5 mg at home- will resume QHS which should help with sleep  -antipsychotic agents: continue Zyprexa 5mg  5. Neuropsych: This patientiscapable of making decisions on hisown behalf. 6. Skin/Wound Care:Routine skin checks 7. Fluids/Electrolytes/Nutrition:Routine in and outs with follow-up chemistries 8. Hypertension. Continue Norvasc 10mg  daily Vitals:   11/09/20 2319 11/10/20 0502  BP: 133/77 136/78  Pulse: 85 70  Resp: 14 16  Temp: 98.2 F (36.8 C) 98.5 F (36.9 C)  SpO2: 98% 99%   BP controlled  6/3 9.  Diabetes mellitus type 2 with hyperglycemia CBG (last 3)  Recent Labs    11/09/20 1712 11/09/20 2053 11/10/20 0613  GLUCAP 303* 123* 183*   5/29 novolog 10U with meals     CBG improved Levimir 22U qhs  10. History of alcohol polysubstance use. Urine drug screen positive cocaine. Provide counseling 11. History of hepatitis C. Follow-up outpatient 12. L foot drop- suggest possible AFO, etc and PRAFO to reduce risk of L ankle contracture 13.   Hx of schizophrenia?, was in a "recovery house" in [place] for a year after wife's death, states it was not drug rehab , increase Zyprexa, to  5mg ,saw a "Counselor " as OP denies seeing psychiatry  14.  Urinary  freq , small amt will trial flomax, incont  5/29 low grade tempx 1, negative  UA, UCX multispecies 15. Insomnia:  melatonin 3mg  HS with some improvement  .  LOS: 22 days A FACE TO [place] E Gionna Polak 11/10/2020, 7:49 AM

## 2020-11-10 NOTE — Progress Notes (Addendum)
Speech Language Pathology Weekly Progress and Session Note  Patient Details  Name: Lance [name] MRN: [id number] Date of Birth: 22-Feb-1946  Beginning of progress report period: 11/02/2020 End of progress report period: 11/10/2020  Today's Date: 11/10/2020 SLP Individual Time:  - 1300-1340 40 minutes    Short Term Goals: Week 3: SLP Short Term Goal 1 (Week 3): Patient will perform divergent and convergent naming tasks with modA cues. SLP Short Term Goal 1 - Progress (Week 3): Progressing toward goal SLP Short Term Goal 2 (Week 3): Patient will describe object function for familiar objects at phrase level with modA. SLP Short Term Goal 2 - Progress (Week 3): Met SLP Short Term Goal 3 (Week 3): Patient will demonstrate adequate orientation to time using visual aids with mod. SLP Short Term Goal 3 - Progress (Week 3): Met SLP Short Term Goal 4 (Week 3): Patient will perform basic level functional tasks with minA cues for maintaining attention and active performance. SLP Short Term Goal 4 - Progress (Week 3): Progressing toward goal SLP Short Term Goal 5 (Week 3): Patient will demonstrate emergent-anticipatory awareness to deficits and impact, with modA cues. SLP Short Term Goal 5 - Progress (Week 3): Progressing toward goal    New Short Term Goals: Week 4: SLP Short Term Goal 1 (Week 4): STG=LTG (anticipated discharge date 6/9)  Weekly Progress Updates:  Patient made slow progress, meeting 2/5 STG's but is making progress towards goals he did not meet. Patient's attention and level of participation are both significant factors that are negatively impacting his overall performance and progress. 24 hour supervision and assistance with all ADL's continues to be recommended.   Intensity: Minumum of 1-2 x/day, 30 to 90 minutes Frequency: 3 to 5 out of 7 days Duration/Length of Stay: 6/10 Treatment/Interventions: Cognitive remediation/compensation;Medication managment;Cueing  hierarchy;Patient/family education;Functional tasks;Speech/Language facilitation   Daily Session  Skilled Therapeutic Interventions: Patient seen for skilled ST session focusing on cognitive-linguistic goals. He was sitting up in tilt WC when SLP arrived and was alert and pleasant overall. He did not recognize SLP despite this being approximately the 4th time this SLP has worked with him. Patient was very perseverative on asking about planned Sunday family education meeting and asking SLP if he needed to call his daughter, etc. Although patient was able to be redirected with modA verbal cues, he would keep coming back to this topic. He was oriented to day of week, discharge date and was able to recall Lance [name] (primary OT working with him). SLP printed patient a June calendar and wrote numbers in larger font which patient reported helped to see. He became slightly irritated when SLP asked him how long he had been here, stating, "about two weeks". Patient continues to benefit from skilled SLP intervention to maximize cognitive-linguistic function prior to discharge.  General    Pain Pain Assessment Pain Scale: 0-10 Pain Score: 0-No pain  Therapy/Group: Individual Therapy  Sonia Baller, MA, CCC-SLP Speech Therapy

## 2020-11-10 NOTE — Progress Notes (Signed)
Physical Therapy Weekly Progress Note  Patient Details  Name: Lance [name] MRN: [id number] Date of Birth: [birth year]  Beginning of progress report period: Nov 03, 2020 End of progress report period: November 10, 2020  Today's Date: 11/10/2020 PT Individual Time: 1005-1100 PT Individual Time Calculation (min): 55 min   Patient has met 2 of 3 short term goals.  Patient has made slow progress toward his goals. He continues to require grossly 2 assist for safety and due to the following impairments: L inattention, L hemiparesis (UE/LE), strong Pusher's to the L, poor attention to task, decreased safety awareness, decreased postural support and control in both sitting and standing as well as neck pain. He has been able to progress to gait training, but requires +2 skilled assist and a wc follow. Patients limited postural awareness and control contributes to his need for a TIS while in CIR and therefore he would benefit from a custom wc for home use upon dc for maximal safety and positioning.   Patient continues to demonstrate the following deficits muscle weakness, decreased cardiorespiratoy endurance, decreased midline orientation, decreased attention to left and decreased motor planning, decreased initiation, decreased attention, decreased awareness, decreased problem solving, decreased safety awareness, decreased memory and delayed processing and decreased sitting balance, decreased standing balance, decreased postural control, hemiplegia and decreased balance strategies and therefore will continue to benefit from skilled PT intervention to increase functional independence with mobility.  Patient progressing toward long term goals..  Continue plan of care.  PT Short Term Goals Week 3:  PT Short Term Goal 1 (Week 3): Patient will ambulate >22f with MaxA x2 and LRAD PT Short Term Goal 1 - Progress (Week 3): Met PT Short Term Goal 2 (Week 3): Patient will transfer bed <> wc with MaxA x1 and LRAD PT  Short Term Goal 2 - Progress (Week 3): Progressing toward goal PT Short Term Goal 3 (Week 3): Patient will complete supine <> sit with ModA x1 PT Short Term Goal 3 - Progress (Week 3): Met Week 4:  PT Short Term Goal 1 (Week 4): STG= LTG based on ELOS  Skilled Therapeutic Interventions/Progress Updates:    Patient received supine in bed, agreeable to PT. He reports "severe" neck pain, but reports that he has already received pain rx. PT providing rest breaks, distractions and repositioning to assist with pain management. MaxA to don shorts at bedlevel with max cues for rolling and sequencing. Once pants were donned, patient reporting that he had to use the urinal. MaxA for placement of urinal- continent void. MaxA to re-don shorts. Patient coming to sit edge of bed with ModA and extended time with Max verbal cues for sequencing. TotalA x2 slideboard transfer to [place] During transfer, patient very easily distracted and perseverating on PTs shoulder touching the side of his face. Very difficult to redirect to task. PT transporting patient in wc to therapy gym for time management and energy conservation. Sit <> stand x4 with MaxA x2 and mirror for visual feedback on posture. Patient, again, very easily distracted and perseverating on other things despite failing posture while standing. Dynamic sitting and attention tasks completed matching playing cards on the board. Initially with board biased L to encourage L attention. Patient with often uncontrolled L lateral lean to reach card and poor awareness of this posture requiring TotalA to correct to upright. 2nd trial with board biased on the R to encourage R weight shift/lean, but patient with significant difficulty completing this task due to poor attention,  poor truncal dissociation and limited effort put forth. Patient returning to room in wc, seatbelt alarm on, call light within reach, TIS reclined.   Therapy Documentation Precautions:   Precautions Precautions: Fall,Other (comment) Precaution Comments: L hemiplegia, pusher to the left Restrictions Weight Bearing Restrictions: No   Therapy/Group: Individual Therapy  Debbora Dus 11/10/2020, 7:42 AM

## 2020-11-11 LAB — GLUCOSE, CAPILLARY
Glucose-Capillary: 160 mg/dL — ABNORMAL HIGH (ref 70–99)
Glucose-Capillary: 99 mg/dL (ref 70–99)
Glucose-Capillary: 246 mg/dL — ABNORMAL HIGH (ref 70–99)
Glucose-Capillary: 209 mg/dL — ABNORMAL HIGH (ref 70–99)

## 2020-11-11 NOTE — Progress Notes (Signed)
Occupational Therapy Session Note  Patient Details  Name: JOURDON ZIMMERLE MRN: [id number] Date of Birth: Nov 07, 1945  Today's Date: 11/11/2020 OT Individual Time: 7225-7505 OT Individual Time Calculation (min): 8 min    Short Term Goals: Week 1:  OT Short Term Goal 1 (Week 1): Pt will complete UB bathing with min assist in support sitting for two consecutive sessions. OT Short Term Goal 1 - Progress (Week 1): Not met OT Short Term Goal 2 (Week 1): Pt will maintain static sitting balance for 5 mins with no more than mod assist in preparation for selfcare tasks. OT Short Term Goal 2 - Progress (Week 1): Not met OT Short Term Goal 3 (Week 1): Pt will donn pullover shirt with mod assist in supported sitting following hemi techniques. OT Short Term Goal 3 - Progress (Week 1): Met OT Short Term Goal 4 (Week 1): Pt will complete toilet transfer to the drop arm commode with max assist squat pivot. OT Short Term Goal 4 - Progress (Week 1): Not met OT Short Term Goal 5 (Week 1): Pt will use the LUE with bathing tasks with no more than min assist to wash the RUE. OT Short Term Goal 5 - Progress (Week 1): Not met  Skilled Therapeutic Interventions/Progress Updates:    1:1. On intitial arrival, pt alseep easily aroused but reporting fatigue. Pt requesting to rest. OT returns 20 min later and tells pt schedule unable to be rearranged and pt continues to decline tx at EOB. Pt reporting pain in neck and Ot retrieves disposable heat pack and educates that K pad/heat cannot be put on lidocane patch. Pt educated on POC and need to participate tomorrow as it was family education. Pt states, "no that's on Sunday" reoriented pt to time. Exited session with pt seated in bed, exit alarm on and call light in reach   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 Restrictions Weight Bearing Restrictions: No General:    Vital Signs: Therapy Vitals Temp: 98.2 F (36.8 C) Temp Source: Oral Pulse Rate: 76 Resp: 16 BP: 123/74 Patient Position (if appropriate): Lying Oxygen Therapy SpO2: 99 % O2 Device: Room Air Pain:   ADL: ADL Eating: Supervision/safety Where Assessed-Eating: Bed level Grooming: Minimal assistance Where Assessed-Grooming: Bed level Upper Body Bathing: Maximal assistance Where Assessed-Upper Body Bathing: Edge of bed Lower Body Bathing: Other (comment) (total +2) Where Assessed-Lower Body Bathing: Edge of bed Upper Body Dressing: Other (Comment) (total assist +2) Where Assessed-Upper Body Dressing: Edge of bed Lower Body Dressing: Other (Comment) (total +2) Where Assessed-Lower Body Dressing: Edge of bed Toileting: Other (Comment) (total +2) Toilet Transfer: Other (comment) (total +2) Toilet Transfer Method: Engineer, water: Drop arm bedside commode Vision   Perception    Praxis   Exercises:   Other Treatments:     Therapy/Group: Individual Therapy  Tonny Branch 11/11/2020, 2:10 PM

## 2020-11-11 NOTE — Progress Notes (Signed)
PROGRESS NOTE   Subjective/Complaints: Sleepy Patient's chart reviewed- No issues reported overnight Vitals signs stable  ROS: Patient denies CP, SOB, N/V/D  Objective:   No results found. No results for input(s): WBC, HGB, HCT, PLT in the last 72 hours. No results for input(s): NA, K, CL, CO2, GLUCOSE, BUN, CREATININE, CALCIUM in the last 72 hours.  Intake/Output Summary (Last 24 hours) at 11/11/2020 1543 Last data filed at 11/11/2020 0823 Gross per 24 hour  Intake 836 ml  Output --  Net 836 ml        Physical Exam: Vital Signs Blood pressure 123/74, pulse 76, temperature 98.2 F (36.8 C), temperature source Oral, resp. rate 16, weight 77.9 kg, SpO2 99 %. Gen: no distress, normal appearing HEENT: oral mucosa pink and moist, NCAT Cardio: Reg rate Chest: normal effort, normal rate of breathing Abd: soft, non-distended Ext: no edema Psych: pleasant, normal affect Skin: intact  Neuro: Alert oriented to person and place Motor: RUE/RLE: 5/5 proximal distal LUE: RUE tr /5 proximal distal LLE: 0/5 proximal and distal  Assessment/Plan: 1. Functional deficits which require 3+ hours per day of interdisciplinary therapy in a comprehensive inpatient rehab setting.  Physiatrist is providing close team supervision and 24 hour management of active medical problems listed below.  Physiatrist and rehab team continue to assess barriers to discharge/monitor patient progress toward functional and medical goals  Care Tool:  Bathing    Body parts bathed by patient: Chest,Left arm,Abdomen,Front perineal area,Right upper leg,Left upper leg,Face   Body parts bathed by helper: Right arm,Buttocks,Left lower leg,Right lower leg Body parts n/a: Right lower leg,Left lower leg,Right arm,Left arm,Chest,Abdomen   Bathing assist Assist Level: Maximal Assistance - Patient 24 - 49%     Upper Body Dressing/Undressing Upper body dressing    What is the patient wearing?: Pull over shirt    Upper body assist Assist Level: Maximal Assistance - Patient 25 - 49%    Lower Body Dressing/Undressing Lower body dressing      What is the patient wearing?: Incontinence brief,Pants     Lower body assist Assist for lower body dressing: 2 Helpers     Toileting Toileting    Toileting assist Assist for toileting: Maximal Assistance - Patient 25 - 49%     Transfers Chair/bed transfer  Transfers assist  Chair/bed transfer activity did not occur: Safety/medical concerns  Chair/bed transfer assist level: Maximal Assistance - Patient 25 - 49% (squat pivot to the left) Chair/bed transfer assistive device: Sliding board   Locomotion Ambulation   Ambulation assist   Ambulation activity did not occur: Safety/medical concerns  Assist level: 2 helpers (+2 mod A) Assistive device: Other (comment) (3 Musketeer) Max distance: 27ft   Walk 10 feet activity   Assist  Walk 10 feet activity did not occur: Safety/medical concerns        Walk 50 feet activity   Assist Walk 50 feet with 2 turns activity did not occur: Safety/medical concerns         Walk 150 feet activity   Assist Walk 150 feet activity did not occur: Safety/medical concerns         Walk 10 feet on uneven surface  activity   Assist Walk 10 feet on uneven surfaces activity did not occur: Safety/medical concerns         Wheelchair     Assist Will patient use wheelchair at discharge?: Yes (TBD but likely) Type of Wheelchair: Manual           Wheelchair 50 feet with 2 turns activity    Assist    Wheelchair 50 feet with 2 turns activity did not occur: Safety/medical concerns       Wheelchair 150 feet activity     Assist  Wheelchair 150 feet activity did not occur: Safety/medical concerns       Blood pressure 123/74, pulse 76, temperature 98.2 F (36.8 C), temperature source Oral, resp. rate 16, weight 77.9 kg,  SpO2 99 %.  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 Antithrombotics: -DVT/anticoagulation:SCDs -antiplatelet therapy: N/A 3. Pain management:Tylenol as needed  5/16: topamax 25mg  started at night for headache/insomnia. Lidocaine patch added for coccyx pain, continue  Pain control improved  4. Mood/schizophrenia:Provide emotional support- was on Zyprexa 5 mg at home- will resume QHS which should help with sleep  -antipsychotic agents: continue Zyprexa 5mg  5. Neuropsych: This patientiscapable of making decisions on hisown behalf. 6. Skin/Wound Care:Routine skin checks 7. Fluids/Electrolytes/Nutrition:Routine in and outs with follow-up chemistries 8. Hypertension. Continue Norvasc 10mg  daily Vitals:   11/11/20 0413 11/11/20 1135  BP: 125/70 123/74  Pulse: 73 76  Resp: 18 16  Temp: 98 F (36.7 C) 98.2 F (36.8 C)  SpO2: 100% 99%   BP controlled  6/3 9.  Diabetes mellitus type 2 with hyperglycemia CBG (last 3)  Recent Labs    11/10/20 2054 11/11/20 0604 11/11/20 1141  GLUCAP 197* 160* 99   5/29 novolog 10U with meals     CBG improved, continue Levimir 22U qhs  10. History of alcohol polysubstance use. Urine drug screen positive cocaine. Provide counseling 11. History of hepatitis C. Follow-up outpatient 12. L foot drop- suggest possible AFO, etc and PRAFO to reduce risk of L ankle contracture 13.   Hx of schizophrenia?, was in a "recovery house" in [place] for a year after wife's death, states it was not drug rehab , increase Zyprexa, to  5mg ,saw a "Counselor " as OP denies seeing psychiatry  14.  Urinary freq , small amt will trial flomax,  incont  5/29 low grade tempx 1, negative  UA, UCX multispecies 15. Insomnia:  melatonin 3mg  HS with some improvement  .  LOS: 23 days A FACE TO FACE EVALUATION WAS PERFORMED  Lance [name] Lance [name] 11/11/2020, 3:43 PM

## 2020-11-12 LAB — GLUCOSE, CAPILLARY
Glucose-Capillary: 188 mg/dL — ABNORMAL HIGH (ref 70–99)
Glucose-Capillary: 217 mg/dL — ABNORMAL HIGH (ref 70–99)
Glucose-Capillary: 169 mg/dL — ABNORMAL HIGH (ref 70–99)
Glucose-Capillary: 254 mg/dL — ABNORMAL HIGH (ref 70–99)

## 2020-11-12 MED ORDER — DICLOFENAC SODIUM 1 % EX GEL
2.0000 g | Freq: Four times a day (QID) | CUTANEOUS | Status: DC
  Administered 2020-11-12 – 2020-11-17 (×19): 2 g via TOPICAL
  Filled 2020-11-12: qty 100

## 2020-11-12 MED ORDER — INSULIN DETEMIR 100 UNIT/ML ~~LOC~~ SOLN
23.0000 [IU] | Freq: Every day | SUBCUTANEOUS | Status: DC
  Administered 2020-11-12 – 2020-11-16 (×5): 23 [IU] via SUBCUTANEOUS
  Filled 2020-11-12 (×6): qty 0.23

## 2020-11-12 NOTE — Progress Notes (Signed)
Physical Therapy Session Note  Patient Details  Name: Lance [name] MRN: [id number] Date of Birth: Oct 18, 1945  Today's Date: 11/12/2020 PT Individual Time: 1105-1205 PT Individual Time Calculation (min): 60 min   Short Term Goals: Week 4:  PT Short Term Goal 1 (Week 4): STG= LTG based on ELOS  Skilled Therapeutic Interventions/Progress Updates:     Patient in TIS w/c with his daughter, [name], present for family education upon PT arrival. Patient alert and agreeable to PT session. Patient denied pain during session.  Focused session on hands on training with [name] for use of Hoyer lift and TIS w/c. Educated on bed and seated positioning for hemi-side and trunk support, pressure relief in the TIS w/c every 30 min using TIS feature to full tilt to move the pressure off the patient's sacrum and shift patient's hips back in the chair, as patient tends to shift forward, and positioning in the bed with rolling every 2 hours. Encouraged that patient sit OOB 1-2 x per day initially and progressing to longer time OOB as tolerated. [name] reports that she is a Marine scientist and has experience with using a Hoyer lift and assisting patient's with hemiplegia.   Educated patient and [name] on [employer] w/c functions for tilt, locking breaks and leg rest management. Elongated L leg rest for improved patient positioning in sitting. Educated on benefits of custom TIS, informed of w/c eval on Tuesday, for improved patient positioning and sitting tolerance. Encouraged [name] to reach out to [facility] with any questions about loaner or custom TIS at d/c.  Performed [employer] transfer +2 with [name] using teach back method to tell therapist what to do, as if PT where a family member that needed to learn for the first time. Sling was placed appropriately with total A, patient able to lift his L leg with min A to place sling under his leg. Encouraged patient to participate and assist as able throughout. [name] managed the [employer]  lift without cues. Educated on use of back strap to move patient for ease of turning patient in the lift to position him in the bed. [name] removed the sling without assist, cuing patient to roll R/L with mod A using chuck pad. [name] demonstrated good body mechanics throughout.   Patient sat EOB with bed flat and use of bed rail with mod-max A x1 and with HOB elevated with mod A x1 with use of bed rail. Provided cues for sequencing, hand placement, and R foot and hand placement to reduce pushing to the L. Patient sat EOB with min A-CGA 2x2-3 min with therapist on his R side for visual target to reduce L lean. Instructed [name] to wait to attempt this mobility at home until HHPT comes out to assist with management of hospital bed that will be provided at d/c, as it will be different from his bed here, [name] and the patient stated understanding.   Patient in bed with R arm elevated with his daughter leaving at end of session with breaks locked, bed alarm set, and all needs within reach.    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 Restrictions Weight Bearing Restrictions: No   Therapy/Group: Individual Therapy  Ali Mclaurin L Cniyah Sproull PT, DPT  11/12/2020, 5:10 PM

## 2020-11-12 NOTE — Progress Notes (Signed)
PROGRESS NOTE   Subjective/Complaints: C/o left sided neck pain, has been using muscle rub and heating pad- latter helps at night and former is not very helpful. Discussed trying voltaren gel and he is agreeable Daughter is present for caregiver training.   ROS: Patient denies CP, SOB, N/V/D  Objective:   No results found. No results for input(s): WBC, HGB, HCT, PLT in the last 72 hours. No results for input(s): NA, K, CL, CO2, GLUCOSE, BUN, CREATININE, CALCIUM in the last 72 hours.  Intake/Output Summary (Last 24 hours) at 11/12/2020 1233 Last data filed at 11/12/2020 0758 Gross per 24 hour  Intake 948 ml  Output 100 ml  Net 848 ml        Physical Exam: Vital Signs Blood pressure 125/71, pulse 72, temperature 98 F (36.7 C), resp. rate 18, weight 77.9 kg, SpO2 100 %. Gen: no distress, normal appearing HEENT: oral mucosa pink and moist, NCAT Cardio: Reg rate Chest: normal effort, normal rate of breathing Abd: soft, non-distended Ext: no edema Psych: pleasant, normal affect Skin: intact  Neuro: Alert oriented to person and place Motor: RUE/RLE: 5/5 proximal distal LUE: RUE tr /5 proximal distal LLE: 0/5 proximal and distal  Assessment/Plan: 1. Functional deficits which require 3+ hours per day of interdisciplinary therapy in a comprehensive inpatient rehab setting.  Physiatrist is providing close team supervision and 24 hour management of active medical problems listed below.  Physiatrist and rehab team continue to assess barriers to discharge/monitor patient progress toward functional and medical goals  Care Tool:  Bathing    Body parts bathed by patient: Chest,Left arm,Abdomen,Front perineal area,Right upper leg,Left upper leg,Face   Body parts bathed by helper: Right arm,Buttocks,Left lower leg,Right lower leg Body parts n/a: Right lower leg,Left lower leg,Right arm,Left arm,Chest,Abdomen   Bathing assist  Assist Level: Maximal Assistance - Patient 24 - 49%     Upper Body Dressing/Undressing Upper body dressing   What is the patient wearing?: Pull over shirt    Upper body assist Assist Level: Maximal Assistance - Patient 25 - 49%    Lower Body Dressing/Undressing Lower body dressing      What is the patient wearing?: Incontinence brief,Pants     Lower body assist Assist for lower body dressing: 2 Helpers     Toileting Toileting    Toileting assist Assist for toileting: Maximal Assistance - Patient 25 - 49%     Transfers Chair/bed transfer  Transfers assist  Chair/bed transfer activity did not occur: Safety/medical concerns  Chair/bed transfer assist level: Maximal Assistance - Patient 25 - 49% (squat pivot to the left) Chair/bed transfer assistive device: Sliding board   Locomotion Ambulation   Ambulation assist   Ambulation activity did not occur: Safety/medical concerns  Assist level: 2 helpers (+2 mod A) Assistive device: Other (comment) (3 Musketeer) Max distance: 17ft   Walk 10 feet activity   Assist  Walk 10 feet activity did not occur: Safety/medical concerns        Walk 50 feet activity   Assist Walk 50 feet with 2 turns activity did not occur: Safety/medical concerns         Walk 150 feet activity  Assist Walk 150 feet activity did not occur: Safety/medical concerns         Walk 10 feet on uneven surface  activity   Assist Walk 10 feet on uneven surfaces activity did not occur: Safety/medical concerns         Wheelchair     Assist Will patient use wheelchair at discharge?: Yes (TBD but likely) Type of Wheelchair: Manual           Wheelchair 50 feet with 2 turns activity    Assist    Wheelchair 50 feet with 2 turns activity did not occur: Safety/medical concerns       Wheelchair 150 feet activity     Assist  Wheelchair 150 feet activity did not occur: Safety/medical concerns       Blood  pressure 125/71, pulse 72, temperature 98 F (36.7 C), resp. rate 18, weight 77.9 kg, SpO2 100 %.  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 Antithrombotics: -DVT/anticoagulation:SCDs -antiplatelet therapy: N/A 3. Pain management:Tylenol as needed  5/16: topamax 25mg  started at night for headache/insomnia. Lidocaine patch added for coccyx pain, continue  Pain control improved  4. Mood/schizophrenia:Provide emotional support- was on Zyprexa 5 mg at home- will resume QHS which should help with sleep  -antipsychotic agents: continue Zyprexa 5mg  5. Neuropsych: This patientiscapable of making decisions on hisown behalf. 6. Skin/Wound Care:Routine skin checks 7. Fluids/Electrolytes/Nutrition:Routine in and outs with follow-up chemistries 8. Hypertension. Continue Norvasc 10mg  daily Vitals:   11/11/20 1953 11/12/20 0326  BP: 135/73 125/71  Pulse: 99 72  Resp: 18 18  Temp: 98.2 F (36.8 C) 98 F (36.7 C)  SpO2: 98% 100%   BP controlled  6/3 9.  Diabetes mellitus type 2 with hyperglycemia CBG (last 3)  Recent Labs    11/11/20 2103 11/12/20 0546 11/12/20 1128  GLUCAP 209* 188* 217*   5/29 novolog 10U with meals     increase Levimir to 23U qhs  10. History of alcohol polysubstance use. Urine drug screen positive cocaine. Provide counseling 11. History of hepatitis C. Follow-up outpatient 12. L foot drop- suggest possible AFO, etc and PRAFO to reduce risk of L ankle contracture 13.   Hx of schizophrenia?, was in a "recovery house" in [place] for a year after wife's death, states it was not drug rehab , increase Zyprexa, to  5mg ,saw a "Counselor " as OP denies  seeing psychiatry  14.  Urinary freq , small amt will trial flomax, incont  5/29 low grade tempx 1, negative  UA, UCX multispecies 15. Insomnia:  Continue melatonin 3mg  HS, with some improvement 16. Left cervical myofascial pain: ordered voltaren gel.   .  LOS: 24 days A FACE TO FACE EVALUATION WAS PERFORMED  Lance [name] 11/12/2020, 12:33 PM

## 2020-11-12 NOTE — Progress Notes (Signed)
Speech Language Pathology Daily Session Note  Patient Details  Name: Lance [name] MRN: [id number] Date of Birth: 11-11-1945  Today's Date: 11/12/2020 SLP Individual Time: 4709-2957 SLP Individual Time Calculation (min): 39 min  Short Term Goals: Week 4: SLP Short Term Goal 1 (Week 4): STG=LTG (anticipated discharge date 6/9)  Skilled Therapeutic Interventions: Pt seen for skilled ST with focus on cognitive goals and pt/caregiver education. Pt's daughter present throughout tx and has extensive knowledge, hx and training in being a caregiver. She currently works as a Marine scientist and states she will be taking over all financial and med management tasks going forward. Pt will be living with family member and will have assist/supervision 24/7 between multiple family members. Daughter states SW is working on Programmer, applications as well. Pt able to ID 3 daily living tasks at home that he will need assistance with due to current cognitive and physical deficits. Pt does demonstrate decreased awareness into deficits at times, 24/7 supervision recommend at d/c to increase safety. Pt and daughter with no further questions at this time. Pt left in w/c with seatbelt intact with daughter in room. Cont ST POC.   Pain Pain Assessment Pain Scale: 0-10 Pain Score: 0-No pain  Therapy/Group: Individual Therapy  Dewaine Conger 11/12/2020, 12:25 PM

## 2020-11-12 NOTE — Progress Notes (Signed)
Occupational Therapy Session Note  Patient Details  Name: Lance [name] MRN: [id number] Date of Birth: 1945/06/30  Today's Date: 11/12/2020 OT Individual Time: 0855-1005 OT Individual Time Calculation (min): 70 min    Short Term Goals: Week 4:  OT Short Term Goal 1 (Week 4): Continue working on established LTGs in prep for discharge.  Skilled Therapeutic Interventions/Progress Updates:   Pt received supine with c/o pain in his neck, un-rated. He and his daughter endorsed this pain as being chronic, and that the tylenol he received earlier should be "kicking in soon". Focus on session on family education with pt's daughter. His daughter has extensive hx with being a caregiver, having provided total level care with hoyer for her late husband and currently works as a Marine scientist, with hx as a CMA/CNA as well. Demonstrated bed level ADLs. Pt able to functionally use LUE with min-mod cueing for attention. Pt completed rolling L with mod A and min A for R. Supine to sitting with mod A. Once in sitting he had mild pushing to the L and required min A for sitting balance overall. Demonstrated hemi dressing technique, pt required mod A to don shirt. Pt returned to supine and manual hoyer was used to transfer pt to TIS w/c. Pt required max A to scoot hips back. Pt was passed off to SLP in room.   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

## 2020-11-13 LAB — GLUCOSE, CAPILLARY
Glucose-Capillary: 144 mg/dL — ABNORMAL HIGH (ref 70–99)
Glucose-Capillary: 161 mg/dL — ABNORMAL HIGH (ref 70–99)
Glucose-Capillary: 107 mg/dL — ABNORMAL HIGH (ref 70–99)
Glucose-Capillary: 123 mg/dL — ABNORMAL HIGH (ref 70–99)

## 2020-11-13 NOTE — Plan of Care (Signed)
  Problem: RH Comprehension Communication Goal: LTG Patient will comprehend basic/complex auditory (SLP) Description: LTG: Patient will comprehend basic/complex auditory information with cues (SLP). Flowsheets (Taken 11/13/2020 1537) LTG: Patient will comprehend: (mildly complex) -- LTG: Patient will comprehend auditory information with cueing (SLP): Moderate Assistance - Patient 50 - 74% Note: Downgrade due to poor carryover    Problem: RH Memory Goal: LTG Patient will demonstrate ability for day to day (SLP) Description: LTG:   Patient will demonstrate ability for day to day recall/carryover during cognitive/linguistic activities with assist  (SLP) Flowsheets (Taken 11/13/2020 1537) LTG: Patient will demonstrate ability for day to day recall/carryover during cognitive/linguistic activities with assist (SLP): Moderate Assistance - Patient 50 - 74% Note: Downgraded due to poor carryover    Problem: RH Awareness Goal: LTG: Patient will demonstrate awareness during functional activites type of (SLP) Description: LTG: Patient will demonstrate awareness during functional activites type of (SLP) Flowsheets Taken 11/13/2020 1537 by Charolett Bumpers, [place] Patient will demonstrate during cognitive/linguistic activities awareness type of: Emergent Taken 10/27/2020 0948 by Nadara Mode T, CCC-SLP LTG: Patient will demonstrate awareness during cognitive/linguistic activities with assistance of (SLP): Minimal Assistance - Patient > 75% Note: Downgraded due to poor carryover

## 2020-11-13 NOTE — Progress Notes (Signed)
Physical Therapy Session Note  Patient Details  Name: Lance [name] MRN: [id number] Date of Birth: 1945/07/22  Today's Date: 11/13/2020 PT Individual Time:  -      Short Term Goals: Week 3:  PT Short Term Goal 1 (Week 3): Patient will ambulate >31f with MaxA x2 and LRAD PT Short Term Goal 1 - Progress (Week 3): Met PT Short Term Goal 2 (Week 3): Patient will transfer bed <> wc with MaxA x1 and LRAD PT Short Term Goal 2 - Progress (Week 3): Progressing toward goal PT Short Term Goal 3 (Week 3): Patient will complete supine <> sit with ModA x1 PT Short Term Goal 3 - Progress (Week 3): Met  Skilled Therapeutic Interventions/Progress Updates: Pt presents semi-reclined in bed asleep and required increased verbal stim to arouse w/ MD present for assessment.  Pt states need for urination, so performed left sidelying to sit w/ max A.  Pt able to hold urinal once PT assists to place urinal and then mod A to maintain seated balance.  Pt continent of urine in sitting.  Pt returned to supine w/ max A and required mod A to thread pants over LLE and then raises R foot to put foot through.  Pt then able to pull pants up over hips on R w/ bridge/roll and RUE, but required roll to R for PT to complete pants over L hip.  Pt returned to sitting w/ max A and then required verbal and visual cueing to maintain midline seated balance.  Pt w/ posterolateral LOB to Left and performed multiple transition down to R elbow/FA to re-focus and then pushes back to midline.  Pt unable to place R hand on bed or thigh or pushes, but cueing to hold L UE improves seated balance.  Pt required max A transfer sit to stand/SPT bed > w.c> bed > w/c.  Pt positioned in midline in TIS w/c and reclined.  Chair alarm on and all needs in reach.     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 Restrictions Weight Bearing Restrictions:  No General:   Vital Signs:   Pain:9/10 neck Pain Assessment Pain Scale: 0-10 Pain Score: 4  Pain Type: Acute pain Pain Location: Head Pain Descriptors / Indicators: Headache Pain Intervention(s): Medication (See eMAR) Mobility:      Therapy/Group: Individual Therapy  JLadoris Gene6/11/2020, 9:54 AM

## 2020-11-13 NOTE — Progress Notes (Signed)
Occupational Therapy Session Note  Patient Details  Name: Lance [name] MRN: [id number] Date of Birth: March 11, 1946  Today's Date: 11/13/2020 OT Individual Time: 1405-1500 OT Individual Time Calculation (min): 55 min    Short Term Goals: Week 4:  OT Short Term Goal 1 (Week 4): Continue working on established LTGs in prep for discharge.  Skilled Therapeutic Interventions/Progress Updates:    Pt in bed to start session with max assist needed for supine to sit and total assist for squat pivot to the wheelchair to the right side.  He was taken down to the dayroom where he transferred squat pivot to the right with total assist to the therapy mat.  Worked on upright midline posture in sitting and standing with use of the mirror for feedback.  He was able to maintain static sitting balance with min guard assist but needed total +2 (pt 30%) for standing using three muskateers technique.  Increased pushing to the left with decreased activation of the left knee and decreased hip extension is noted.  He needs max demonstrational cueing to achieve head up looking at the mirror or objects in the room for greater than 3 seconds.  Motor impersistence is present and he slowly drops into flexion while standing.  Increased neck pain is reported as well which may contribute to his inability to maintain cervical extension and upright posture for longer periods.  He was able to maintain standing for up to 3 mins before sitting down.  Once therapy tech left, had pt work on forward weightshifts to the right for scooting to the right.  Max assist isneeded for pt to achieve forward trunk flexion over the pushing right side and complete scooting up and down the therapy mat.  Finished session with squat pivot transfer to the wheelchair at total assist to the left with return to the EOB at the same level.  He needed max assist for transition into supine from sitting on the left side as well.  Call button and phone in reach with  safety alarm in place.    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

## 2020-11-13 NOTE — Progress Notes (Signed)
PROGRESS NOTE   Subjective/Complaints:  No issues overnight , daughter participated in family training yesterday   ROS: Patient denies CP, SOB, N/V/D  Objective:   No results found. No results for input(s): WBC, HGB, HCT, PLT in the last 72 hours. No results for input(s): NA, K, CL, CO2, GLUCOSE, BUN, CREATININE, CALCIUM in the last 72 hours.  Intake/Output Summary (Last 24 hours) at 11/13/2020 0851 Last data filed at 11/13/2020 0751 Gross per 24 hour  Intake 476 ml  Output --  Net 476 ml        Physical Exam: Vital Signs Blood pressure 123/74, pulse 69, temperature 98.8 F (37.1 C), resp. rate 18, weight 77.9 kg, SpO2 99 %.  General: No acute distress Mood and affect are appropriate Heart: Regular rate and rhythm no rubs murmurs or extra sounds Lungs: Clear to auscultation, breathing unlabored, no rales or wheezes Abdomen: Positive bowel sounds, soft nontender to palpation, nondistended Extremities: No clubbing, cyanosis, or edema Skin: No evidence of breakdown, no evidence of rash    Neuro: Alert oriented to person and place Motor: RUE/RLE: 5/5 proximal distal LUE: RUE tr /5 proximal distal LLE: 0/5 proximal and distal  Assessment/Plan: 1. Functional deficits which require 3+ hours per day of interdisciplinary therapy in a comprehensive inpatient rehab setting.  Physiatrist is providing close team supervision and 24 hour management of active medical problems listed below.  Physiatrist and rehab team continue to assess barriers to discharge/monitor patient progress toward functional and medical goals  Care Tool:  Bathing    Body parts bathed by patient: Chest,Left arm,Abdomen,Front perineal area,Right upper leg,Left upper leg,Face   Body parts bathed by helper: Right arm,Buttocks,Left lower leg,Right lower leg Body parts n/a: Right lower leg,Left lower leg,Right arm,Left arm,Chest,Abdomen   Bathing  assist Assist Level: Maximal Assistance - Patient 24 - 49%     Upper Body Dressing/Undressing Upper body dressing   What is the patient wearing?: Pull over shirt    Upper body assist Assist Level: Maximal Assistance - Patient 25 - 49%    Lower Body Dressing/Undressing Lower body dressing      What is the patient wearing?: Incontinence brief,Pants     Lower body assist Assist for lower body dressing: 2 Helpers     Toileting Toileting    Toileting assist Assist for toileting: Maximal Assistance - Patient 25 - 49%     Transfers Chair/bed transfer  Transfers assist  Chair/bed transfer activity did not occur: Safety/medical concerns  Chair/bed transfer assist level: Dependent - mechanical lift Chair/bed transfer assistive device: Mechanical lift   Locomotion Ambulation   Ambulation assist   Ambulation activity did not occur: Safety/medical concerns  Assist level: 2 helpers (+2 mod A) Assistive device: Other (comment) (3 Musketeer) Max distance: 23ft   Walk 10 feet activity   Assist  Walk 10 feet activity did not occur: Safety/medical concerns        Walk 50 feet activity   Assist Walk 50 feet with 2 turns activity did not occur: Safety/medical concerns         Walk 150 feet activity   Assist Walk 150 feet activity did not occur: Safety/medical concerns  Walk 10 feet on uneven surface  activity   Assist Walk 10 feet on uneven surfaces activity did not occur: Safety/medical concerns         Wheelchair     Assist Will patient use wheelchair at discharge?: Yes (TBD but likely) Type of Wheelchair: Manual           Wheelchair 50 feet with 2 turns activity    Assist    Wheelchair 50 feet with 2 turns activity did not occur: Safety/medical concerns       Wheelchair 150 feet activity     Assist  Wheelchair 150 feet activity did not occur: Safety/medical concerns       Blood pressure 123/74, pulse 69,  temperature 98.8 F (37.1 C), resp. rate 18, weight 77.9 kg, SpO2 99 %.  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 Antithrombotics: -DVT/anticoagulation:SCDs -antiplatelet therapy: N/A 3. Pain management:Tylenol as needed  5/16: topamax 25mg  started at night for headache/insomnia. Lidocaine patch added for coccyx pain, continue, tial of voltaren gel for neck pain  Pain control improved  4. Mood/schizophrenia:Provide emotional support- was on Zyprexa 5 mg at home- will resume QHS which should help with sleep  -antipsychotic agents: continue Zyprexa 5mg  5. Neuropsych: This patientiscapable of making decisions on hisown behalf. 6. Skin/Wound Care:Routine skin checks 7. Fluids/Electrolytes/Nutrition:Routine in and outs with follow-up chemistries 8. Hypertension. Continue Norvasc 10mg  daily Vitals:   11/12/20 1935 11/13/20 0312  BP: 136/78 123/74  Pulse: 84 69  Resp: 18 18  Temp: 98.5 F (36.9 C) 98.8 F (37.1 C)  SpO2: 100% 99%   BP controlled  6/6 9.  Diabetes mellitus type 2 with hyperglycemia CBG (last 3)  Recent Labs    11/12/20 1707 11/12/20 2117 11/13/20 0622  GLUCAP 169* 254* 144*   5/29 novolog 10U with meals     increase Levimir to 23U qhs - some lability in pms, may be diet related  10. History of alcohol polysubstance use. Urine drug screen positive cocaine. Provide counseling 11. History of hepatitis C. Follow-up outpatient 12. L foot drop- suggest possible AFO, etc and PRAFO to reduce risk of L ankle contracture 13.   Hx of schizophrenia?, was in a "recovery house" in [place] for a year after wife's death, states it was not drug rehab ,  increase Zyprexa, to  5mg ,saw a "Counselor " as OP denies seeing psychiatry  14.  Urinary freq , small amt will trial flomax, incont  5/29 low grade tempx 1, negative  UA, UCX multispecies 15. Insomnia:  Continue melatonin 3mg  HS, with some improvement 16. Left cervical myofascial pain: ordered voltaren gel.   .  LOS: 25 days A FACE TO FACE EVALUATION WAS PERFORMED  Charlett Blake 11/13/2020, 8:51 AM

## 2020-11-13 NOTE — Progress Notes (Signed)
Patient ID: Lance [name], male   DOB: [birth year], 75 y.o.   MRN: [id number]   Pt referral sent to [facility].  [place], [place]

## 2020-11-13 NOTE — Progress Notes (Signed)
Patient ID: Lance [name], male   DOB: 1946-02-06, 75 y.o.   MRN: [id number]  Pt declined by [facility], [place]

## 2020-11-13 NOTE — Progress Notes (Signed)
Speech Language Pathology Daily Session Note  Patient Details  Name: RYATT CORSINO MRN: [id number] Date of Birth: May 16, 1946  Today's Date: 11/13/2020 SLP Individual Time: 1002-1053 SLP Individual Time Calculation (min): 51 min  Short Term Goals: Week 4: SLP Short Term Goal 1 (Week 4): STG=LTG (anticipated discharge date 6/9)  Skilled Therapeutic Interventions: Skilled ST services focused on cognitive skills. SLP facilitated basic-mildly complex problem solving and error awareness skills utilizing 4 picture card sequence task, pt required mod A verbal cues. Pt expressed frustration throughout session, demonstrated impacted awareness of impact of cognitive deficits and carryover of targeted skills. SLP attempted to administer SLUMS cognitive assessments to support current deficits, however pt becoming increaselying more frustrated. Pt expressed desire and only concern to call daughter to discuss d/c plan. Pt was unable to dial number due to memory, attention and problem solving deficits. SLP educated pt on cognitive deficits impacting functional skills, such as dialing numbers on phone. Pt continues to demonstrate limited awareness and SLP downgraded goals to reflect poor carryover. Pt missed 7 minutes due to refusal. Pt was left with call bell within reach and chair alarm set. Recommend to continue skilled ST services.      Pain Pain Assessment Pain Score: 0-No pain  Therapy/Group: Individual Therapy  Amethyst Gainer  [facility] 11/13/2020, 12:44 PM

## 2020-11-14 LAB — GLUCOSE, CAPILLARY
Glucose-Capillary: 156 mg/dL — ABNORMAL HIGH (ref 70–99)
Glucose-Capillary: 147 mg/dL — ABNORMAL HIGH (ref 70–99)
Glucose-Capillary: 228 mg/dL — ABNORMAL HIGH (ref 70–99)
Glucose-Capillary: 217 mg/dL — ABNORMAL HIGH (ref 70–99)

## 2020-11-14 NOTE — Progress Notes (Signed)
Patient ID: Lance [name], male   DOB: 1945/12/04, 75 y.o.   MRN: [id number] Patient approved by [facility].   [place], [place]

## 2020-11-14 NOTE — Plan of Care (Signed)
  Problem: RH Car Transfers Goal: LTG Patient will perform car transfers with assist (PT) Description: LTG: Patient will perform car transfers with assistance (PT). Outcome: Not Applicable Note: Ambulance/medical transport   Problem: RH Ambulation Goal: LTG Patient will ambulate in controlled environment (PT) Description: LTG: Patient will ambulate in a controlled environment, # of feet with assistance (PT). Outcome: Not Applicable Flowsheets (Taken 11/14/2020 0922) LTG: Pt will ambulate in controlled environ  assist needed:: (+2 skilled assist) Other (Comment) Note: Downgraded due to patient progress; patient will not be ambulatory at home

## 2020-11-14 NOTE — Progress Notes (Signed)
Physical Therapy Session Note  Patient Details  Name: ABDULAHI [name] MRN: [id number] Date of Birth: [birth year]  Today's Date: 11/14/2020 PT Individual Time: 5993-5701 PT Individual Time Calculation (min): 47 min   Short Term Goals: Week 4:  PT Short Term Goal 1 (Week 4): STG= LTG based on ELOS  Skilled Therapeutic Interventions/Progress Updates:    Pt received supine in bed and agreeable to therapy session. Pt continues to demo hyper-verbosity with difficulty directing pt's attention to the task at hand requiring max, repeated cuing to attend to ask. Pt excitedly reports and demonstrates increased movement in L hand - pt demos ability to open/close all fingers and individually move fingers to a certain degree - therapist educated pt on importance of using LUE more during daily tasks to continue advancing motor return. Supine>sitting L EOB, HOB flat but using bedrail, with max assist for L LE management and trunk upright - pt demos improved ability to sustain midline once upright with pt quickly reaching for footboard with R UE to assist with his trunk control without cuing. Sitting EOB, with mod assist for trunk control while +2 provided max assist for threading shorts on LEs - pt still unable to lift L LE from ground to assist with this task despite efforts. Sit>stand from EOB with 3 Musketeer support (pt able to place L UE around therapist's shoulders with less assistance) and +2 mod assist for lifting to stand and balance, blocking L knee - pt demos slight decreased pushing with R LE requiring less assist to maintain midline and prevent L lean - total assist to pull pants up over hips. Sit<>stand again from EOB using 3 Musketeer support working on L LE NMR, midline orientation, and standing tolerance/balance - +2 mod assist for lifting to stand and balance while blocking L knee - facilitation and cuing for increased trunk/hip extension to improve upright posture.   R squat pivot EOB>TIS w/c with  heavy max assist of 1 and +2 mod assist for lifting/pivoting hips and maintaining trunk control due to L lean - pt demos decreased motor planning and recall of transfer training from last week, requires increased cuing for head/hips relationship and sequencing with pt demoing increased pushing with R UE.  Transported to/from gym in w/c for time management and energy conservation. Gait training ~11ft 3 Musketeer support with +2 mod progressed to +2 max assist for standing balance and facilitating stepping - max facilitation for increased L knee extension during stance phase along with hip extension and L weight shift - max facilitation for R weight shift onto R LE during stance phase to promote L swing to overcome pushing tendencies - requires max facilitation for L LE swing phase advancement 100% of the time.  Pt requesting to return to bed at end of session. L squat pivot EOM>TIS w/c with heavy max assist of 1 and +2 min assist for lifting/pivoting hips and maintaining trunk control - requires repeated cuing to sustain R anterior trunk weight shift to maintain head/hips relationship and cuing for pt to push down through B LEs to assist with lifting hips - continues to have poor recall/carryover of transfer training from last week. Transported back to room. L squat pivot TIS w/c>EOB with same assist as just written but cuing for pt to place R forearm onto w/c armrest to help with sustained R trunk lean/weight shift. Pt asking therapist to place pillows at foot of bed due to L inattention and pt not seeing head of bed with pillows on  his L - reoriented pt. Sit>supine with max assist of 1 for trunk descent and B LE management onto bed. Pt left supine in bed with needs in reach and bed alarm on.  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 Restrictions Weight Bearing Restrictions: No  Pain: No reports of pain  throughout session.   Therapy/Group: Individual Therapy  Tawana Scale , PT, DPT, CSRS  11/14/2020, 3:07 PM

## 2020-11-14 NOTE — Progress Notes (Signed)
PROGRESS NOTE   Subjective/Complaints:  Had a good night , no pain c/os  ROS: Patient denies CP, SOB, N/V/D  Objective:   No results found. No results for input(s): WBC, HGB, HCT, PLT in the last 72 hours. No results for input(s): NA, K, CL, CO2, GLUCOSE, BUN, CREATININE, CALCIUM in the last 72 hours.  Intake/Output Summary (Last 24 hours) at 11/14/2020 0751 Last data filed at 11/13/2020 1855 Gross per 24 hour  Intake 474 ml  Output --  Net 474 ml        Physical Exam: Vital Signs Blood pressure (!) 152/84, pulse 76, temperature 98 F (36.7 C), resp. rate 18, weight 77.9 kg, SpO2 98 %.  General: No acute distress Mood and affect are appropriate Heart: Regular rate and rhythm no rubs murmurs or extra sounds Lungs: Clear to auscultation, breathing unlabored, no rales or wheezes Abdomen: Positive bowel sounds, soft nontender to palpation, nondistended Extremities: No clubbing, cyanosis, or edema Skin: No evidence of breakdown, no evidence of rash  Neuro: Alert oriented to person and place Motor: RUE/RLE: 5/5 proximal distal LUE: RUE tr /5 proximal distal LLE: 0/5 proximal and distal  Assessment/Plan: 1. Functional deficits which require 3+ hours per day of interdisciplinary therapy in a comprehensive inpatient rehab setting.  Physiatrist is providing close team supervision and 24 hour management of active medical problems listed below.  Physiatrist and rehab team continue to assess barriers to discharge/monitor patient progress toward functional and medical goals  Care Tool:  Bathing    Body parts bathed by patient: Chest,Left arm,Abdomen,Front perineal area,Right upper leg,Left upper leg,Face   Body parts bathed by helper: Right arm,Buttocks,Left lower leg,Right lower leg Body parts n/a: Right lower leg,Left lower leg,Right arm,Left arm,Chest,Abdomen   Bathing assist Assist Level: Maximal Assistance - Patient  24 - 49%     Upper Body Dressing/Undressing Upper body dressing   What is the patient wearing?: Pull over shirt    Upper body assist Assist Level: Maximal Assistance - Patient 25 - 49%    Lower Body Dressing/Undressing Lower body dressing      What is the patient wearing?: Incontinence brief,Pants     Lower body assist Assist for lower body dressing: 2 Helpers     Toileting Toileting    Toileting assist Assist for toileting: Maximal Assistance - Patient 25 - 49%     Transfers Chair/bed transfer  Transfers assist  Chair/bed transfer activity did not occur: Safety/medical concerns  Chair/bed transfer assist level: Total Assistance - Patient < 25% (squat pivot) Chair/bed transfer assistive device: Mechanical lift   Locomotion Ambulation   Ambulation assist   Ambulation activity did not occur: Safety/medical concerns  Assist level: 2 helpers (+2 mod A) Assistive device: Other (comment) (3 Musketeer) Max distance: 94ft   Walk 10 feet activity   Assist  Walk 10 feet activity did not occur: Safety/medical concerns        Walk 50 feet activity   Assist Walk 50 feet with 2 turns activity did not occur: Safety/medical concerns         Walk 150 feet activity   Assist Walk 150 feet activity did not occur: Safety/medical concerns  Walk 10 feet on uneven surface  activity   Assist Walk 10 feet on uneven surfaces activity did not occur: Safety/medical concerns         Wheelchair     Assist Will patient use wheelchair at discharge?: Yes (TBD but likely) Type of Wheelchair: Manual           Wheelchair 50 feet with 2 turns activity    Assist    Wheelchair 50 feet with 2 turns activity did not occur: Safety/medical concerns       Wheelchair 150 feet activity     Assist  Wheelchair 150 feet activity did not occur: Safety/medical concerns       Blood pressure (!) 152/84, pulse 76, temperature 98 F (36.7 C),  resp. rate 18, weight 77.9 kg, SpO2 98 %.  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 Antithrombotics: -DVT/anticoagulation:SCDs -antiplatelet therapy: N/A 3. Pain management:Tylenol as needed  5/16: topamax 25mg  started at night for headache/insomnia. Lidocaine patch added for coccyx pain, continue, tial of voltaren gel for neck pain  Pain control improved  4. Mood/schizophrenia:Provide emotional support- was on Zyprexa 5 mg at home- will resume QHS which should help with sleep  -antipsychotic agents: continue Zyprexa 5mg  5. Neuropsych: This patientiscapable of making decisions on hisown behalf. 6. Skin/Wound Care:Routine skin checks 7. Fluids/Electrolytes/Nutrition:Routine in and outs with follow-up chemistries 8. Hypertension. Continue Norvasc 10mg  daily Vitals:   11/13/20 1920 11/14/20 0429  BP: 117/69 (!) 152/84  Pulse: 77 76  Resp: 18 18  Temp: 98 F (36.7 C) 98 F (36.7 C)  SpO2: 100% 98%   BP controlled  6/7 9.  Diabetes mellitus type 2 with hyperglycemia CBG (last 3)  Recent Labs    11/13/20 1619 11/13/20 2106 11/14/20 0604  GLUCAP 107* 123* 156*   5/29 novolog 10U with meals - controlled 6/6    increase Levimir to 23U qhs - some lability in pms, may be diet related  10. History of alcohol polysubstance use. Urine drug screen positive cocaine. Provide counseling 11. History of hepatitis C. Follow-up outpatient 12. L foot drop- suggest possible AFO, etc and PRAFO to reduce risk of L ankle contracture 13.   Hx of schizophrenia?, was in a "recovery house" in [place] for a year after wife's death, states it was  not drug rehab , increase Zyprexa, to  5mg ,saw a "Counselor " as OP denies seeing psychiatry  14.  Urinary freq , small amt will trial flomax, incont  5/29 low grade tempx 1, negative  UA, UCX multispecies 15. Insomnia:  Continue melatonin 3mg  HS, with some improvement 16. Left cervical myofascial pain: ordered voltaren gel.   .  LOS: 26 days A FACE TO FACE EVALUATION WAS PERFORMED  Charlett Blake 11/14/2020, 7:51 AM
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
## 2020-11-14 NOTE — Progress Notes (Signed)
Occupational Therapy Session Note  Patient Details  Name: Lance [name] MRN: [id number] Date of Birth: 1946-03-06  Today's Date: 11/14/2020 OT Individual Time: 5397-6734 OT Individual Time Calculation (min): 56 min    Short Term Goals: Week 4:  OT Short Term Goal 1 (Week 4): Continue working on established LTGs in prep for discharge.  Skilled Therapeutic Interventions/Progress Updates:    Pt in the tilt in space wheelchair leaning to the left when therapist entered.  No awareness of left lean with therapist providing max instructional cueing and max assist to correct.  He was able to complete transfer into the shower with use of the [facility] and severe pushing to the left.  Total assist for removal of clothing sit to stand in the shower with min assist to complete UB bathing and mod assist for LB bathing in sitting only.  Still with increased pushing to the left and max demonstrational cueing to relax back in the 3:1 and place his right forearm on the grab bar to decrease pushing.  He is unable to maintain upright midline posture with any functional task and leans forward and to the left.  He needed total assist with washing buttocks in sitting with lateral lean to the right.  Once he dried off he completed stand pivot transfer to the wheelchair to the right with total assist and maintained squat position throughout secondary decreased spatial awareness.  Dressing was completed sit to stand at the sink with total +2 (pt 25%) for all LB dressing and max assist to [employer] shirt.  Total assist for donning gripper socks as well.  Finished session with transfer squat pivot to the bed at total assist with max facilitation for sit to supine.  Call button and phone in reach with safety alarm in place.     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 Restrictions Weight Bearing Restrictions: No  Pain: Pain  Assessment Pain Scale: Faces Pain Score: 0-No pain Faces Pain Scale: Hurts a little bit Pain Type: Chronic pain Pain Location: Neck Pain Orientation: Posterior Pain Descriptors / Indicators: Discomfort Pain Onset: With Activity Pain Intervention(s): Repositioned Multiple Pain Sites: No ADL: See Care Tool Section for some details of mobility and selfcare  Therapy/Group: Individual Therapy  Evan Mackie,Konstantine OTR/L 11/14/2020, 12:53 PM

## 2020-11-15 LAB — GLUCOSE, CAPILLARY
Glucose-Capillary: 110 mg/dL — ABNORMAL HIGH (ref 70–99)
Glucose-Capillary: 185 mg/dL — ABNORMAL HIGH (ref 70–99)
Glucose-Capillary: 300 mg/dL — ABNORMAL HIGH (ref 70–99)
Glucose-Capillary: 222 mg/dL — ABNORMAL HIGH (ref 70–99)

## 2020-11-15 NOTE — Progress Notes (Signed)
PROGRESS NOTE   Subjective/Complaints:  TIred today some neck stiffness no new c/os  ROS: Patient denies CP, SOB, N/V/D  Objective:   No results found. No results for input(s): WBC, HGB, HCT, PLT in the last 72 hours. No results for input(s): NA, K, CL, CO2, GLUCOSE, BUN, CREATININE, CALCIUM in the last 72 hours.  Intake/Output Summary (Last 24 hours) at 11/15/2020 0831 Last data filed at 11/15/2020 0810 Gross per 24 hour  Intake 1380 ml  Output --  Net 1380 ml        Physical Exam: Vital Signs Blood pressure (!) 142/73, pulse 65, temperature 98.2 F (36.8 C), resp. rate 18, weight 77.9 kg, SpO2 100 %.   General: No acute distress Mood and affect are appropriate Heart: Regular rate and rhythm no rubs murmurs or extra sounds Lungs: Clear to auscultation, breathing unlabored, no rales or wheezes Abdomen: Positive bowel sounds, soft nontender to palpation, nondistended Extremities: No clubbing, cyanosis, or edema Skin: No evidence of breakdown, no evidence of rash  Neuro: Alert oriented to person and place Motor: RUE/RLE: 5/5 proximal distal LUE: RUE tr /5 proximal distal LLE: 0/5 proximal and distal  Assessment/Plan: 1. Functional deficits which require 3+ hours per day of interdisciplinary therapy in a comprehensive inpatient rehab setting.  Physiatrist is providing close team supervision and 24 hour management of active medical problems listed below.  Physiatrist and rehab team continue to assess barriers to discharge/monitor patient progress toward functional and medical goals  Care Tool:  Bathing    Body parts bathed by patient: Right arm,Left arm,Chest,Abdomen,Front perineal area,Right upper leg,Left upper leg,Face   Body parts bathed by helper: Left lower leg,Right lower leg,Buttocks Body parts n/a: Right lower leg,Left lower leg,Right arm,Left arm,Chest,Abdomen   Bathing assist Assist Level: Moderate  Assistance - Patient 50 - 74% (in sitting only)     Upper Body Dressing/Undressing Upper body dressing   What is the patient wearing?: Pull over shirt    Upper body assist Assist Level: Maximal Assistance - Patient 25 - 49%    Lower Body Dressing/Undressing Lower body dressing      What is the patient wearing?: Incontinence brief,Pants     Lower body assist Assist for lower body dressing: 2 Helpers     Toileting Toileting    Toileting assist Assist for toileting: Maximal Assistance - Patient 25 - 49%     Transfers Chair/bed transfer  Transfers assist  Chair/bed transfer activity did not occur: Safety/medical concerns  Chair/bed transfer assist level: 2 Helpers (max A of 1 and +2 mod A squat pivot) Chair/bed transfer assistive device: Mechanical lift   Locomotion Ambulation   Ambulation assist   Ambulation activity did not occur: Safety/medical concerns  Assist level: 2 helpers (+2 max A) Assistive device: Other (comment) (3Muskteer) Max distance: 70f   Walk 10 feet activity   Assist  Walk 10 feet activity did not occur: Safety/medical concerns        Walk 50 feet activity   Assist Walk 50 feet with 2 turns activity did not occur: Safety/medical concerns         Walk 150 feet activity   Assist Walk 150 feet activity  did not occur: Safety/medical concerns         Walk 10 feet on uneven surface  activity   Assist Walk 10 feet on uneven surfaces activity did not occur: Safety/medical concerns         Wheelchair     Assist Will patient use wheelchair at discharge?: Yes (TBD but likely) Type of Wheelchair: Manual           Wheelchair 50 feet with 2 turns activity    Assist    Wheelchair 50 feet with 2 turns activity did not occur: Safety/medical concerns       Wheelchair 150 feet activity     Assist  Wheelchair 150 feet activity did not occur: Safety/medical concerns       Blood pressure (!) 142/73,  pulse 65, temperature 98.2 F (36.8 C), resp. rate 18, weight 77.9 kg, SpO2 100 %.  Medical Problem List and Plan: 1.Left hemiparesis, now with spasticity and dysarthriasecondary to right intraparenchymal hemorrhage on the right frontal parietal area likely due to hypertension as well as cocaine use and suggestion of amyloid angiopathy  Continue CIR PT, OT- Team conference today please see physician documentation under team conference tab, met with team  to discuss problems,progress, and goals. Formulized individual treatment plan based on medical history, underlying problem and comorbidities.   PRAFO ordered  Tizanidine trial for LLE tone some improvement  2. Antithrombotics: -DVT/anticoagulation:SCDs -antiplatelet therapy: N/A 3. Pain management:Tylenol as needed  5/16: topamax 74m started at night for headache/insomnia. Lidocaine patch added for coccyx pain, continue, tial of voltaren gel for neck pain  Pain control improved  4. Mood/schizophrenia:Provide emotional support- was on Zyprexa 5 mg at home- will resume QHS which should help with sleep  -antipsychotic agents: continue Zyprexa 517m5. Neuropsych: This patientiscapable of making decisions on hisown behalf. 6. Skin/Wound Care:Routine skin checks 7. Fluids/Electrolytes/Nutrition:Routine in and outs with follow-up chemistries 8. Hypertension. Continue Norvasc 1059maily Vitals:   11/14/20 2025 11/15/20 0519  BP: 111/71 (!) 142/73  Pulse: 86 65  Resp: 18 18  Temp: 98.4 F (36.9 C) 98.2 F (36.8 C)  SpO2: 100% 100%   BP controlled  6/8 9.  Diabetes mellitus type 2 with hyperglycemia CBG (last 3)  Recent Labs    11/14/20 1649 11/14/20 2131 11/15/20 0605  GLUCAP 228* 217* 110*   5/29 novolog 10U with meals - controlled 6/8    increase Levimir to 23U qhs - some lability in pms, may be diet related  10. History of alcohol polysubstance use. Urine drug screen positive cocaine.  Provide counseling 11. History of hepatitis C. Follow-up outpatient 12. L foot drop- suggest possible AFO, etc and PRAFO to reduce risk of L ankle contracture 13.   Hx of schizophrenia?, [place]" [place] wife's death, states it was not drug rehab , increase Zyprexa, to  5mg49mw a "Counselor " as OP denies seeing psychiatry  14.  Urinary freq , small amt will trial flomax, incont  5/29 low grade tempx 1, negative  UA, UCX multispecies 15. Insomnia:  Continue melatonin 3mg 70m with some improvement 16. Left cervical myofascial pain: ordered voltaren gel.   .  LOS: 27 days A FACE TO FACE EVALUATION WAS PERFORMED  AndreCharlett Blake2022, 8:31 AM

## 2020-11-15 NOTE — Discharge Summary (Signed)
Physician Discharge Summary  Patient ID: Lance [name] MRN: [id number] DOB/AGE: 07/09/1945 75 y.o.  Admit date: 10/19/2020 Discharge date: 11/17/2020  Discharge Diagnoses:  Principal Problem:   Intraparenchymal hemorrhage of brain [facility]) Active Problems:   Controlled type 2 diabetes mellitus with hyperglycemia (HCC)   Essential hypertension   Vascular headache   Spastic hemiparesis (HCC) History of alcohol polysubstance abuse History of hepatitis C Question history of schizophrenia Left cervical myofascial pain  Discharged Condition: Stable  Significant Diagnostic Studies: No results found.  Labs:  Basic Metabolic Panel: No results for input(s): NA, K, CL, CO2, GLUCOSE, BUN, CREATININE, CALCIUM, MG, PHOS in the last 168 hours.  CBC: No results for input(s): WBC, NEUTROABS, HGB, HCT, MCV, PLT in the last 168 hours.  CBG: Recent Labs  Lab 11/15/20 2101 11/16/20 0557 11/16/20 1159 11/16/20 1700 11/16/20 2109  GLUCAP 222* 96 178* 93 263*   Family history.  Sister with diabetes.  Denies any colon cancer esophageal cancer or rectal cancer  Brief HPI:   Lance [name] is a 75 y.o. right-handed male with history of diabetes mellitus, hepatitis C and schizophrenia as well as alcohol polysubstance use.  Per chart review patient lives alone 1 level home 3 steps to entry.  Used a Rollator for the past few weeks due to gait dysfunction.  Presented 10/14/2020 with weakness x2 weeks especially left lower extremity with gait disorder.  CT the head showed a 3.4 x 1.7 x 2.8 cm acute right frontal parietal parenchymal bleed.  CT cervical spine negative.  MRI follow-up showed stable intra-axial hemorrhage in the right superior frontal gyrus motor strip.  Stable right anterior frontal convexity en plaque meningioma since December with comparison of prior imaging.  Small bilateral subdural hematomas resolved since earlier tracings of December.  Prior MRIs were also suggestive of chronic  microangiopathy and mild hemorrhages concerning for possible amyloid angiopathy.  Echocardiogram with ejection fraction of 70 to 54% grade 1 diastolic dysfunction.  Admission chemistries unremarkable except glucose 167 alcohol negative urine drug screen positive cocaine.  Neurology follow-up conservative care as well as monitoring of blood pressure.  Tolerating a regular consistency diet.  Due to patient's decreased functional mobility was admitted for a comprehensive rehab program.   Hospital Course: JERARDO COSTABILE was admitted to rehab 10/19/2020 for inpatient therapies to consist of PT, ST and OT at least three hours five days a week. Past admission physiatrist, therapy team and rehab RN have worked together to provide customized collaborative inpatient rehab.  Pertain to patient's right intraparenchymal hemorrhage on the right frontal parietal area likely hypertensive as well as cocaine induced suggestive of possible amyloid angiopathy.  Patient continued to be followed by neurology services.  Making steady progressive gains.  SCDs for DVT prophylaxis.  Pain management with the use of Topamax started at nighttime for headaches as well as some insomnia.  Lidocaine patch added for coccyx pain continued Voltaren gel for neck pain.  Mood/schizophrenia and maintained on Zyprexa as prior to admission.  He was also followed by neuropsychology during his rehabilitation stay.  Blood pressure controlled and monitored on Norvasc.  Blood sugars better controlled with diabetic teaching insulin therapy as directed.  Patient did have a history of alcohol polysubstance abuse urine drug screen positive cocaine on admission patient family receiving counsel regards to cessation of illicit drug products.  Patient did have some urinary frequency no dysuria placed on trial of Flomax urinalysis negative.   Blood pressures were monitored on TID basis and  controlled  Diabetes has been monitored with ac/hs CBG checks and SSI was  use prn for tighter BS control.    Rehab course: During patient's stay in rehab weekly team conferences were held to monitor patient's progress, set goals and discuss barriers to discharge. At admission, patient required +2 physical assist supine to sit +2 physical assist sit to stand moderate assist upper body bathing max is lower body bathing moderate assist upper body dressing max is lower body dressing  Physical exam.  Blood pressure 160/85 pulse 68 temperature 99 respirations 18 oxygen saturations 100% room air Constitutional.  No acute distress HEENT Head.  Mild left facial droop Eyes.  Pupils round and reactive to light no discharge without nystagmus Neck.  Supple nontender no JVD without thyromegaly Cardiac regular rate rhythm not extra sounds or murmur heard Abdomen.  Soft nontender positive bowel sounds without rebound Respiratory effort normal no respiratory distress without wheeze Musculoskeletal. Comments.  Right upper extremity 5/5 Left upper extremity biceps question 1/5, triceps 2/5, wrist extension 4 -/5, grip 3/5, and FA 2/5 Right lower extremity 5/5 Left lower extremity 0/5 except 1/5 in PF Skin.  Warm and dry  He/She  has had improvement in activity tolerance, balance, postural control as well as ability to compensate for deficits. He/She has had improvement in functional use RUE/LUE  and RLE/LLE as well as improvement in awareness.  Patient attending therapies needing some direction at time to maintain attention.  Patient demonstrates improved ability to sustain midline once upright with patient quickly reaching for footboard as needed.  Ambulating 20 feet +2 moderate assist.  Patient did have some poor carryover stressed the need for safety.  He was able to complete transfer into the shower with use of the [facility] and severe pushing to the left at times.  Total assist removal of clothing sit to stand to shower with minimal assist to complete upper body bathing moderate assist  lower body bathing and sitting only.  SLP facilitated basic mildly complex problem-solving with air awareness skills utilizing for picture card sequence task but required moderate assist verbal cues.  Full family teaching completed stressed the need for safety supervision on discharge.       Disposition: Discharged to home    Diet: Carb modified  Special Instructions: No driving smoking or alcohol  Medications at discharge 1.  Tylenol as needed 2.  Norvasc 10 mg p.o. daily 3.  Lipitor 20 mg p.o. daily 4.  Voltaren gel 2 g 4 times daily to affected area 5.  Neurontin 100 mg p.o. 3 times daily 6.  NovoLog 10 units 3 times daily with meals 7.  Levemir 23 units nightly 8.  Lidoderm patch as directed 9.  Melatonin 3 mg p.o. nightly 10.  Zyprexa 5 mg p.o. nightly 11.  Protonix 40 mg p.o. nightly 12.  Flomax 0.4 mg p.o. daily after supper 13.  Zanaflex 2 mg p.o. 3 times daily with meals and bedtime 14.  Ultram 50 mg p.o. every 12 hours as needed  30-35 minutes were spent completing discharge summary and discharge planning  Discharge Instructions     Ambulatory referral to Neurology   Complete by: As directed    An appointment is requested in approximately 4 weeks intraparenchymal hemorrhage   Ambulatory referral to Physical Medicine Rehab   Complete by: As directed    Moderate complexity follow-up 1 to 2 weeks IPH        Follow-up Information     Kirsteins, Luanna Salk, MD  Follow up.   Specialty: Physical Medicine and Rehabilitation Why: Office to call for appointment Contact information: [place] [address] [phone]                 Signed: Cathlyn Parsons 11/17/2020, 5:31 AM

## 2020-11-15 NOTE — Progress Notes (Addendum)
Physical Therapy Session Note  Patient Details  Name: Lance [name] MRN: [id number] Date of Birth: February 19, 1946  Today's Date: 11/15/2020 PT Individual Time: 1310-1403 PT Individual Time Calculation (min): 53 min   Short Term Goals: Week 4:  PT Short Term Goal 1 (Week 4): STG= LTG based on ELOS  Skilled Therapeutic Interventions/Progress Updates:    Pt received sitting in TIS w/c with nursing staff present assisting pt in Ranger lift for toileting. Pt agreeable to therapy session and so therapist assumed care of patient. Sitting>standing dependently via use of Clarise Cruz - pt demos strong pushing with R UE/LE causing strong L lateral trunk lean in [place], requires assist to position L LE in front of knee block for patient safety. Clarise Cruz lift transfer in/out of bathroom. Standing with dependent assist in Wilder stedy during total assist peri-care and LB clothing management - requires +2 assist for safety as pt continues to demo strong pushing with RUE and R LE with pt 1x reaching to push against wall with R UE. Clarise Cruz transfer back to TIS w/c.  Transported to/from gym in w/c for time management and energy conservation.   R squat pivot transfer TIS w/c>EOM with max assist of 1 and +2 min assist for safety - pt demos improved motor planning and sequencing of this transfer (more similarly to how pt was performing it on 6/2, improved from yesterday) - cuing and facilitation to maintain anterior trunk lean/weight shift during transfer.  Dynamic sitting balance task on EOM working on midline awareness/orientation while performing reaching task focused on R lateral trunk lean/pelvic weight shift then bringing object slightly L of midline while avoiding L trunk LOB - pt able to perform with CGA progressed towards min/mod assist to prevent LOB due to increasing fatigue.  Sit<>stand 2x to/from EOM with +2 mod/max assist for lifting to stand, blocking L knee, - mirror feedback and cuing for improved midline orientation and  decreased pushing - cuing for increased trunk/hip extension for upright posturing - tolerated standing ~13minute each time.  L squat pivot with total assist of 1 and +2 min assist (requries increased assist in this direction) - pt able to recall proper head/hips relationship and with cuing able to sustain R anterior trunk lean during transfer - requires assist for lifting hips and pivoting.  Transported back to room and left sitting, tilted back in TIS w/c with needs in reach and seat belt alarm on.  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 Restrictions Weight Bearing Restrictions: No  Pain: Addendum: Pt reports posterior neck pain that radiates downwards towards his thoracic spine while participating in reaching task sitting EOM - pt continues to have very rigid/stiff neck posturing as he has displayed throughout entire CIR stay - therapist provides rest breaks and repositioning for pain management.  Therapy/Group: Individual Therapy  Tawana Scale , PT, DPT, CSRS  11/15/2020, 12:19 PM

## 2020-11-15 NOTE — Progress Notes (Signed)
Patient ID: Lance [name], male   DOB: February 03, 1946, 75 y.o.   MRN: [id number] Team Conference Report to Patient/Family  Team Conference discussion was reviewed with the patient and caregiver, including goals, any changes in plan of care and target discharge date.  Patient and caregiver express understanding and are in agreement.  The patient has a target discharge date of 11/17/20.  SW met with patient, provided updates. Sw confirming transportation time with patient daughter  [name] 11/15/2020, 1:38 PM

## 2020-11-15 NOTE — Progress Notes (Signed)
Occupational Therapy Session Note  Patient Details  Name: Lance [name] MRN: [id number] Date of Birth: [birth year]  Today's Date: 11/15/2020 OT Individual Time: 6063-0160 OT Individual Time Calculation (min): 42 min    Short Term Goals: Week 4:  OT Short Term Goal 1 (Week 4): Continue working on established LTGs in prep for discharge.  Skilled Therapeutic Interventions/Progress Updates:    Pt in tilt in space wheelchair to start session.  He completed transition down to the dayroom where he transferred squat pivot to the therapy mat with total +2 (pt 30%).  He was able to complete static sitting balance with min guard but still with increased left lean.  Mirror was provided for feedback to which he is able to use to correct his posture.  However, he is unable to maintain his attention as he will flex his head and push to the left.  Worked on sit to stand and standing at total assist +2 (pt 30%) using three muskateers technique.  He exhibits increased flexion through his trunk and neck in standing.  Had him work on reaching up and to the right with the RUE while standing to promote upright posture.  He was able to tolerate standing intervals of up to 3 mins before needing to sit secondary to neck or left shoulder pain.  He was able to perform short distance mobility at the same +2 level to transfer back to the wheelchair at 4-5'.  Returned to the room where he used the urinal from the wheelchair with mod assist.  Total assist +2 (pt 30%) for transfer squat pivot transfer to the bed with max assist for transfer to supine.  Finished session with pt in the bed and call button and phone in reach.  Safety alarm in place at this time.    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 Restrictions Weight Bearing Restrictions: No  Pain: Pain Assessment Pain Scale: Faces Faces Pain Scale: Hurts a little  bit Pain Type: Chronic pain Pain Location: Neck Pain Orientation: Posterior Pain Descriptors / Indicators: Discomfort Pain Onset: With Activity Pain Intervention(s): Repositioned ADL: See Care Tool Section for some details of mobility and selfcare Therapy/Group: Individual Therapy  Thara Searing,[place] OTR/L 11/15/2020, 4:34 PM

## 2020-11-15 NOTE — Progress Notes (Signed)
Speech Language Pathology Daily Session Note  Patient Details  Name: Lance [name] MRN: [id number] Date of Birth: 01/16/1946  Today's Date: 11/15/2020 SLP Individual Time: 1000-1055 SLP Individual Time Calculation (min): 55 min  Short Term Goals: Week 4: SLP Short Term Goal 1 (Week 4): STG=LTG (anticipated discharge date 6/9)  Skilled Therapeutic Interventions:Skilled ST services focused on cognitive skills. SLP facilitated administration of cognitive linguistic assessment, SLUMS, pt scored 15 out 30 (n=>27) indicating moderate-severe deficits in word finding, sustained attention, short term recall, basic problem solving and error awareness. Pt is aware of cognitive "changes" however does not seem to understand the impact of the deficits. Poor attention and word finding deficits are noted in conversation, in which pt demonstrated circumlocution, use of non specific langauge and requires cuing to remain on topic. SLP provided education pertaining to acute cognitive deficits and need to continue skilled ST services after d/c, pt agrees. Pt was left in room with call bell within reach and chair alarm set. SLP recommends to continue skilled services.     Pain Pain Assessment Pain Scale: 0-10 Pain Score: 0-No pain  Therapy/Group: Individual Therapy  Lance [name]  [name] 11/15/2020, 11:00 AM

## 2020-11-15 NOTE — Progress Notes (Signed)
Physical Therapy Session Note  Patient Details  Name: Lance [name] MRN: [id number] Date of Birth: April 23, 1946  Today's Date: 11/15/2020 PT Individual Time: 0900-0945 PT Individual Time Calculation (min): 45 min   Short Term Goals: Week 3:  PT Short Term Goal 1 (Week 3): Patient will ambulate >58f with MaxA x2 and LRAD PT Short Term Goal 1 - Progress (Week 3): Met PT Short Term Goal 2 (Week 3): Patient will transfer bed <> wc with MaxA x1 and LRAD PT Short Term Goal 2 - Progress (Week 3): Progressing toward goal PT Short Term Goal 3 (Week 3): Patient will complete supine <> sit with ModA x1 PT Short Term Goal 3 - Progress (Week 3): Met  Skilled Therapeutic Interventions/Progress Updates: Pt presents supine in bed asleep and requires arousal to participate w/ therapy.  Pt able to bridge for doffing soiled brief of urine.  Pt able to clean periarea w/ verbal cues and set-up of washcloth.  Pt starts to clean face and then asked where he is supposed to clean, he states down in his privates.  Pt able to clean self and dry but reminders for performing correct area.  Pt able to bridge w/ verbal cues for RLE and max A for placement of LLE, to don clean brief although requires roll to complete pulling up on Left.  Pt required mod to max A for log roll and to sit, and then reaches to R for footboard.  Pt able to maintain sitting for threading pants over feet w/ pt placing RLE in pants.  Pt can assist pulling pants up over knees.  Pt required A of 2 for 3 musketeers for pulling up pants in standing.  Pt able to put L UE over PT shoulders, but unable to tolerate increased standing or transfer to w/c 2/2 height differential, c/o L neck/shoulder pain.  Pt transferred sit to stand w/ max A and then SPT w/ A+2.  Pt required assist w/ repositioning in w/c w/ verbal cueing for monitoring L side lean.  Pt wheeled to gym for time conservation into // bars.  Pt performed multiple sit to stand transfers w/ A + 2 w/  placement of L hand on // bars.  Pt w/ max A for maintaining midline stance and blocking L knee.  Pt returned to room and remained in TIS reclined w/ seat belt and chair alarm on, all needs in reach.     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 Restrictions Weight Bearing Restrictions: No General:   Vital Signs:  Pain:states 10/10 pain in neck. Pain Assessment Pain Scale: 0-10 Pain Score: 0-No pain Mobility:      Therapy/Group: Individual Therapy  JLadoris Gene6/01/2021, 10:39 AM

## 2020-11-15 NOTE — Patient Care Conference (Signed)
Inpatient RehabilitationTeam Conference and Plan of Care Update Date: 11/15/2020   Time: 11:03 AM    Patient Name: Lance [name]      Medical Record Number: [id number]  Date of Birth: [birth year] Sex: Male         Room/Bed: 4W24C/4W24C-01 Payor Info: Payor: HU[employer] MEDICARE / Plan: HU[employer] MEDICARE HMO / Product Type: *No Product type* /    Admit Date/Time:  10/19/2020  1:15 PM  Primary Diagnosis:  Intraparenchymal hemorrhage of brain [facility])  Hospital Problems: Principal Problem:   Intraparenchymal hemorrhage of brain ([place]) Active Problems:   Controlled type 2 diabetes mellitus with hyperglycemia [facility])   Essential hypertension   Vascular headache   Spastic hemiparesis [facility])    Expected Discharge Date: Expected Discharge Date: 11/17/20  Team Members Present: Physician leading conference: Dr. Alysia Penna Care Coodinator Present: Dorien Chihuahua, RN, BSN, CRRN;Christina Broadway, [place] Nurse Present: Dorien Chihuahua, RN PT Present: Page Spiro, PT OT Present: Clyda Greener, OT SLP Present: Nadara Mode, SLP PPS Coordinator present : Gunnar Fusi, SLP     Current Status/Progress Goal Weekly Team Focus  Bowel/Bladder   continent of bowel and continent to incontinent of bladder,  LBM 11/14/20.  Remain continent of bowel, retrain bladder and assess for any change in bladder function.  Assess Q shift and PRN bowel and bladder function.   Swallow/Nutrition/ Hydration             ADL's   Min guard for UB bathing in supported sitting, mod assist for LB in sitting with no standing.  Max assist for UB dressing with total +2 for LB dressing sit to stand.  Total assist for squat pivot transfers.  downgraded to max assist overall  selfcare retraining, transfer training, balance retraining, NMR, pt/family education, DME use   Mobility   MaxA/ModA sit <> supine, TotalA 1-2 lateral scoot/slideboard, +2 Mod/MaxA sit <> stand with 3 musketeers, +2 maxA gait 22ft, wc eval completed for custom  TIS wc, family ed completed with dtr [name] on [place], hospital bed and custom wc  downgraded to MaxA/TotalA  fam ed, patient ed, dc planning   Communication             Safety/Cognition/ Behavioral Observations            Pain   c/o pain to posterior neck, Voltaran gel and PRN tylenol administered- effective.  pain level less than 1  Assess pain level Q shift and PRN   Skin   skin dry and intact  remain skin dry and intact  Assess skin Q shift and PRN, float heels on pillows, use skin barrier cream during pericare.     Discharge Planning:  pt to d/c home with dtrs to provide care   Team Discussion: Family education completed. Left inattention and poor sustained attention to tasks limits progress. Requires constant cues for tasks.  Patient on target to meet rehab goals: Currently min assist for upper body bathing and max assist for upper body dressing and total assist for sit - stand tranfers.   *See Care Plan and progress notes for long and short-term goals.   Revisions to Treatment Plan:  Down graded goals to min - mod assist for SLP Teaching Needs:   Current Barriers to Discharge: Decreased caregiver support, Home enviroment access/layout, Incontinence and Insurance for SNF coverage  Possible Resolutions to Barriers: Family education completed with daughter Custom wheelchair consult 11/14/20 Arrange ambulance transport for discharge home     Medical Summary Current Status: chronic  neck pain , pusher' s syndrome, Left neglect  Barriers to Discharge: Medical stability   Possible Resolutions to Barriers/Weekly Focus: needs [facility] consult, downgraded cognitive goals, ambulance transfer home   Continued Need for Acute Rehabilitation Level of Care: The patient requires daily medical management by a physician with specialized training in physical medicine and rehabilitation for the following reasons: Direction of a multidisciplinary physical rehabilitation program to maximize  functional independence : Yes Medical management of patient stability for increased activity during participation in an intensive rehabilitation regime.: Yes Analysis of laboratory values and/or radiology reports with any subsequent need for medication adjustment and/or medical intervention. : Yes   I attest that I was present, lead the team conference, and concur with the assessment and plan of the team.   Dorien Chihuahua B 11/15/2020, 3:12 PM

## 2020-11-16 LAB — GLUCOSE, CAPILLARY
Glucose-Capillary: 96 mg/dL (ref 70–99)
Glucose-Capillary: 178 mg/dL — ABNORMAL HIGH (ref 70–99)
Glucose-Capillary: 93 mg/dL (ref 70–99)
Glucose-Capillary: 263 mg/dL — ABNORMAL HIGH (ref 70–99)

## 2020-11-16 MED ORDER — OLANZAPINE 5 MG PO TABS: 5.0000 mg | ORAL_TABLET | Freq: Every day | ORAL | 0 refills | Status: AC

## 2020-11-16 MED ORDER — NOVOLOG FLEXPEN 100 UNIT/ML ~~LOC~~ SOPN: 10.0000 [IU] | PEN_INJECTOR | Freq: Three times a day (TID) | SUBCUTANEOUS | 11 refills | Status: AC

## 2020-11-16 MED ORDER — ATORVASTATIN CALCIUM 20 MG PO TABS: 20.0000 mg | ORAL_TABLET | Freq: Every day | ORAL | 0 refills | Status: AC

## 2020-11-16 MED ORDER — DICLOFENAC SODIUM 1 % EX GEL: 2.0000 g | Freq: Four times a day (QID) | CUTANEOUS | 0 refills | Status: AC

## 2020-11-16 MED ORDER — TAMSULOSIN HCL 0.4 MG PO CAPS: 0.4000 mg | ORAL_CAPSULE | Freq: Every day | ORAL | 0 refills | Status: AC

## 2020-11-16 MED ORDER — PANTOPRAZOLE SODIUM 40 MG PO TBEC: 40.0000 mg | DELAYED_RELEASE_TABLET | Freq: Every day | ORAL | 0 refills | Status: AC

## 2020-11-16 MED ORDER — GABAPENTIN 100 MG PO CAPS: 100.0000 mg | ORAL_CAPSULE | Freq: Three times a day (TID) | ORAL | 0 refills | Status: AC

## 2020-11-16 MED ORDER — MELATONIN 3 MG PO TABS: 3.0000 mg | ORAL_TABLET | Freq: Every day | ORAL | 0 refills | Status: AC

## 2020-11-16 MED ORDER — TIZANIDINE HCL 2 MG PO TABS: 2.0000 mg | ORAL_TABLET | Freq: Three times a day (TID) | ORAL | 0 refills | Status: DC

## 2020-11-16 MED ORDER — LEVEMIR FLEXTOUCH 100 UNIT/ML ~~LOC~~ SOPN: 23.0000 [IU] | PEN_INJECTOR | Freq: Every day | SUBCUTANEOUS | 11 refills | Status: DC

## 2020-11-16 MED ORDER — LIDOCAINE 5 % EX PTCH: 1.0000 | MEDICATED_PATCH | CUTANEOUS | 0 refills | Status: DC

## 2020-11-16 MED ORDER — ALBUTEROL SULFATE HFA 108 (90 BASE) MCG/ACT IN AERS: 2.0000 | INHALATION_SPRAY | Freq: Four times a day (QID) | RESPIRATORY_TRACT | 0 refills | Status: AC | PRN

## 2020-11-16 MED ORDER — TRAMADOL HCL 50 MG PO TABS: 50.0000 mg | ORAL_TABLET | Freq: Two times a day (BID) | ORAL | 0 refills | Status: DC | PRN

## 2020-11-16 MED ORDER — SENNOSIDES-DOCUSATE SODIUM 8.6-50 MG PO TABS: 1.0000 | ORAL_TABLET | Freq: Two times a day (BID) | ORAL | Status: AC

## 2020-11-16 MED ORDER — CYANOCOBALAMIN 1000 MCG PO TABS: 1000.0000 ug | ORAL_TABLET | Freq: Every day | ORAL | 0 refills | Status: AC

## 2020-11-16 MED ORDER — AMLODIPINE BESYLATE 10 MG PO TABS: 10.0000 mg | ORAL_TABLET | Freq: Every day | ORAL | 0 refills | Status: AC

## 2020-11-16 MED ORDER — ACETAMINOPHEN 325 MG PO TABS: 650.0000 mg | ORAL_TABLET | ORAL | Status: AC | PRN

## 2020-11-16 MED ORDER — INSULIN DETEMIR 100 UNIT/ML FLEXPEN: 23.0000 [IU] | PEN_INJECTOR | Freq: Every day | SUBCUTANEOUS | 11 refills | Status: DC

## 2020-11-16 MED ORDER — FERROUS SULFATE 325 (65 FE) MG PO TABS: 325.0000 mg | ORAL_TABLET | Freq: Every day | ORAL | 0 refills | Status: AC

## 2020-11-16 NOTE — Progress Notes (Signed)
PROGRESS NOTE   Subjective/Complaints:  No issues overnite Thinks Zyprexa helps him sleep    ROS: Patient denies CP, SOB, N/V/D  Objective:   No results found. No results for input(s): WBC, HGB, HCT, PLT in the last 72 hours. No results for input(s): NA, K, CL, CO2, GLUCOSE, BUN, CREATININE, CALCIUM in the last 72 hours.  Intake/Output Summary (Last 24 hours) at 11/16/2020 0738 Last data filed at 11/15/2020 1800 Gross per 24 hour  Intake 600 ml  Output 125 ml  Net 475 ml         Physical Exam: Vital Signs Blood pressure 129/75, pulse 90, temperature 98 F (36.7 C), resp. rate 18, weight 77.9 kg, SpO2 100 %.   General: No acute distress Mood and affect are appropriate Heart: Regular rate and rhythm no rubs murmurs or extra sounds Lungs: Clear to auscultation, breathing unlabored, no rales or wheezes Abdomen: Positive bowel sounds, soft nontender to palpation, nondistended Extremities: No clubbing, cyanosis, or edema Skin: No evidence of breakdown, no evidence of rash   Neuro: Alert oriented to person and place Motor: RUE/RLE: 5/5 proximal distal LUE: 3- Biceps, triceps and grip- poor release  LLE: trace hip/knee ext synergy   Assessment/Plan: 1. Functional deficits which require 3+ hours per day of interdisciplinary therapy in a comprehensive inpatient rehab setting. Physiatrist is providing close team supervision and 24 hour management of active medical problems listed below. Physiatrist and rehab team continue to assess barriers to discharge/monitor patient progress toward functional and medical goals  Care Tool:  Bathing    Body parts bathed by patient: Right arm, Left arm, Chest, Abdomen, Front perineal area, Right upper leg, Left upper leg, Face   Body parts bathed by helper: Left lower leg, Right lower leg, Buttocks Body parts n/a: Right lower leg, Left lower leg, Right arm, Left arm, Chest,  Abdomen   Bathing assist Assist Level: Moderate Assistance - Patient 50 - 74% (in sitting only)     Upper Body Dressing/Undressing Upper body dressing   What is the patient wearing?: Pull over shirt    Upper body assist Assist Level: Maximal Assistance - Patient 25 - 49%    Lower Body Dressing/Undressing Lower body dressing      What is the patient wearing?: Incontinence brief, Pants     Lower body assist Assist for lower body dressing: 2 Helpers     Toileting Toileting    Toileting assist Assist for toileting: Maximal Assistance - Patient 25 - 49%     Transfers Chair/bed transfer  Transfers assist  Chair/bed transfer activity did not occur: Safety/medical concerns  Chair/bed transfer assist level: 2 Helpers (total A of 1 and +2 min A - squat pivot) Chair/bed transfer assistive device: Other (SPT)   Locomotion Ambulation   Ambulation assist   Ambulation activity did not occur: Safety/medical concerns  Assist level: 2 helpers Assistive device: Other (comment) (three muskateers) Max distance: 5'   Walk 10 feet activity   Assist  Walk 10 feet activity did not occur: Safety/medical concerns        Walk 50 feet activity   Assist Walk 50 feet with 2 turns activity did not occur: Safety/medical  concerns         Walk 150 feet activity   Assist Walk 150 feet activity did not occur: Safety/medical concerns         Walk 10 feet on uneven surface  activity   Assist Walk 10 feet on uneven surfaces activity did not occur: Safety/medical concerns         Wheelchair     Assist Will patient use wheelchair at discharge?: Yes (TBD but likely) Type of Wheelchair: Manual           Wheelchair 50 feet with 2 turns activity    Assist    Wheelchair 50 feet with 2 turns activity did not occur: Safety/medical concerns       Wheelchair 150 feet activity     Assist  Wheelchair 150 feet activity did not occur: Safety/medical  concerns       Blood pressure 129/75, pulse 90, temperature 98 F (36.7 C), resp. rate 18, weight 77.9 kg, SpO2 100 %.  Medical Problem List and Plan: 1.  Left hemiparesis, now with spasticity and dysarthria secondary to right intraparenchymal hemorrhage on the right frontal parietal area likely due to hypertension as well as cocaine use and suggestion of amyloid angiopathy  Continue CIR PT, OT-    PRAFO ordered  Tizanidine trial for LLE tone some improvement  2.  Antithrombotics: -DVT/anticoagulation: SCDs             -antiplatelet therapy: N/A 3. Pain management: Tylenol as needed  5/16: topamax 25mg  started at night for headache/insomnia. Lidocaine patch added for coccyx pain, continue, tial of voltaren gel for neck pain  Pain control improved  4. Mood/schizophrenia: Provide emotional support- was on Zyprexa 5 mg at home- will resume QHS which should help with sleep              -antipsychotic agents: continue Zyprexa 5mg  5. Neuropsych: This patient is capable of making decisions on his own behalf. 6. Skin/Wound Care: Routine skin checks 7. Fluids/Electrolytes/Nutrition: Routine in and outs with follow-up chemistries 8.  Hypertension.  Continue Norvasc 10 mg daily Vitals:   11/15/20 1303 11/15/20 1957  BP: 127/72 129/75  Pulse: 88 90  Resp: 18 18  Temp: 98.2 F (36.8 C) 98 F (36.7 C)  SpO2: 99% 100%   BP controlled  6/9 9.  Diabetes mellitus type 2 with hyperglycemia CBG (last 3)  Recent Labs    11/15/20 1649 11/15/20 2101 11/16/20 0557  GLUCAP 300* 222* 96    5/29 novolog 10U with meals - controlled 6/9    increase Levimir to 23U qhs - some lability in pms, may be diet related  10.  History of alcohol polysubstance use.  Urine drug screen positive cocaine.  Provide counseling 11.  History of hepatitis C.  Follow-up outpatient 12. L foot drop- suggest possible AFO, etc and PRAFO to reduce risk of L ankle contracture 13.   Hx of schizophrenia?, was in a "recovery  house" in [place] for a year after wife's death, states it was not drug rehab , increase Zyprexa, to  5mg ,saw a "Counselor " as OP denies seeing psychiatry  14.  Urinary freq , small amt will trial flomax, incont  5/29 low grade tempx 1, negative  UA, UCX multispecies 15. Insomnia:  Continue melatonin 3mg  HS, with some improvement 16. Left cervical myofascial pain: ordered voltaren gel.   .  LOS: 28 days A FACE TO FACE EVALUATION WAS PERFORMED  Charlett Blake 11/16/2020, 7:38 AM

## 2020-11-16 NOTE — Progress Notes (Signed)
Occupational Therapy Discharge Summary  Patient Details  Name: Lance [name] MRN: [id number] Date of Birth: 06-Dec-1945  Today's Date: 11/16/2020 OT Individual Time: 1102-1200 OT Individual Time Calculation (min): 58 min    Session Note:  Pt up in tilt in space wheelchair to start session.  Sensation and UE functional movement was tested before leaving the room with results listed below.  He was then taken down to the therapy gym where he completed squat pivot transfer to the left at total assist +2 (pt 30%).  Attempted transfer to the right, however he was not able to forward weightshift to the right to complete positioning for transfer with increased neck pain reported.  Once on the mat he was able to maintain sitting balance with overall min guard assist while engaged in L functional use.  Educated pt on coordination task to be completed at home picking up small foam pieces from the bedside table and place in a cup.  He was able to complete with increased time and min assist, exhibiting occasional drops.  Also provided education on AROM/AAROM exercises for the left shoulder and elbow to be completed daily.  Mod demonstrational cueing to return demonstrate.  Finished session with squat pivot transfer to the right with total assist +2 (pt 20%) secondary to increased pushing.  He was returned to the room and left tilted back in the chair with the call button and phone in reach and safety belt in place.    Patient has met 8 of 13 long term goals due to improved activity tolerance, improved balance, postural control, ability to compensate for deficits, functional use of  LEFT upper extremity, improved attention, improved awareness, and improved coordination.  Patient to discharge at overall Independent level.  Patient's care partner requires assistance to provide the necessary physical and cognitive assistance at discharge.    Reasons goals not met: Pt continues to need total +2 for all transfers squat  pivot as well as for LB selfcare sit to stand.  Recommendation:  Patient will benefit from ongoing skilled OT services in home health setting to continue to advance functional skills in the area of BADL and Reduce care partner burden.  He continues to demonstrate severe spatial awareness deficits with increased pushing to the left side in sitting and in standing with total +2 for completion of all transfers as well as LB selfcare sit to stand.  LUE functional use continues to improve as well, however he still demonstrates only Brunnstrum stage IV movement in the arm and stage VI in the hand.  Feel he will benefit from [facility] to decrease caregiver burden and increase overall ADL performance to a mod assist level or greater once the pushing resolves.      Equipment: Wheelchair, hoyer lift, hospital bed  Reasons for discharge: discharge from hospital  Patient/family agrees with progress made and goals achieved: Yes  OT Discharge Precautions/Restrictions  Precautions Precautions: Fall;Other (comment) Precaution Comments: L hemiplegia, pusher to the left Restrictions Weight Bearing Restrictions: No   Pain Pain Assessment Pain Scale: Faces Faces Pain Scale: Hurts little more Pain Type: Chronic pain Pain Location: Neck Pain Orientation: Posterior Pain Descriptors / Indicators: Discomfort Pain Onset: With Activity Pain Intervention(s): Repositioned ADL ADL Eating: Supervision/safety Where Assessed-Eating: Wheelchair Grooming: Supervision/safety Where Assessed-Grooming: Wheelchair Upper Body Bathing: Minimal assistance Where Assessed-Upper Body Bathing: Wheelchair Lower Body Bathing: Unable to assess (total +2 (pt 25%) sit to stand) Where Assessed-Lower Body Bathing: Wheelchair Upper Body Dressing: Maximal assistance Where Assessed-Upper Body Dressing:  Wheelchair Lower Body Dressing: Other (Comment) (total +2 (pt 25%) sit to stand) Where Assessed-Lower Body Dressing:  Wheelchair Toileting: Other (Comment) (total +2 (pt 25%) sit to stand) Toilet Transfer: Other (comment) (total +2 (pt 25%)) Toilet Transfer Method: Squat pivot Toilet Transfer Equipment: Drop arm bedside commode Tub/Shower Transfer: Not assessed Social research officer, government: Other (comment) (total +2 (pt 25%) squat pivot) Social research officer, government Method: Education officer, environmental: Gaffer Baseline Vision/History: Wears glasses Wears Glasses: Reading only Patient Visual Report: No change from baseline Vision Assessment?: Yes Eye Alignment: Within Functional Limits Ocular Range of Motion: Within Functional Limits Alignment/Gaze Preference: Within Defined Limits Tracking/Visual Pursuits: Decreased smoothness of horizontal tracking;Decreased smoothness of vertical tracking Convergence: Within functional limits Visual Fields: Other (comment) (Slight decreased ability to detect stimulus in the far left visual field but needs further testing to confirm.) Perception  Perception: Impaired Inattention/Neglect: Does not attend to left side of body Praxis Praxis: Impaired Praxis Impairment Details: Motor planning;Initiation Praxis-Other Comments: motor impersistence Cognition Overall Cognitive Status: Impaired/Different from baseline Arousal/Alertness: Awake/alert Orientation Level: Oriented X4 Focused Attention: Appears intact Sustained Attention: Impaired Sustained Attention Impairment: Verbal basic;Functional basic Selective Attention: Impaired Selective Attention Impairment: Verbal basic;Functional basic Memory: Impaired Memory Impairment: Storage deficit;Decreased recall of new information Awareness Impairment: Emergent impairment;Anticipatory impairment Problem Solving: Impaired Reasoning: Impaired Reasoning Impairment: Verbal basic;Functional basic Initiating: Impaired Initiating Impairment: Verbal basic;Functional basic Safety/Judgment:  Impaired Sensation Sensation Light Touch: Appears Intact Hot/Cold: Not tested Proprioception: Impaired Detail Proprioception Impaired Details: Impaired LUE Stereognosis: Not tested Coordination Gross Motor Movements are Fluid and Coordinated: No Fine Motor Movements are Fluid and Coordinated: No Coordination and Movement Description: Pt with Brunnstrum stage IV movement in the left arm and stage VI in the hand.  He exhibits more proximal weakness in the shoulder with min assist needed to integrate into bathing tasks with mod assist for UB dressing tasks.  He is unable to use it currently while standing for LB selfcare. Motor  Motor Motor: Abnormal postural alignment and control;Hemiplegia Mobility  Bed Mobility Bed Mobility: Supine to Sit;Sit to Supine;Rolling Right;Rolling Left Rolling Right: Moderate Assistance - Patient 50-74% Rolling Left: Contact Guard/Touching assist Supine to Sit: Maximal Assistance - Patient - Patient 25-49% Sit to Supine: Maximal Assistance - Patient 25-49% Transfers Sit to Stand: 2 Helpers Stand to Sit: 2 Helpers  Trunk/Postural Assessment  Cervical Assessment Cervical Assessment: Exceptions to [facility] (maintains cervical flexion with decreased AROM in all directions and increased posterior cervical pain.) Thoracic Assessment Thoracic Assessment: Exceptions to [facility] (thoracic rounding) Lumbar Assessment Lumbar Assessment: Exceptions to [facility] (posterior pelvic tilt) Postural Control Trunk Control: left pushing with left lean noted in sitting with dynamic sitting and standing tasks.  Balance Balance Balance Assessed: Yes Static Sitting Balance Static Sitting - Balance Support: Feet supported Static Sitting - Level of Assistance: 4: Min assist (contact guard) Dynamic Sitting Balance Dynamic Sitting - Balance Support: Feet supported Dynamic Sitting - Level of Assistance: 2: Max assist Static Standing Balance Static Standing - Balance Support: Right upper  extremity supported Static Standing - Level of Assistance: 1: +2 Total assist Dynamic Standing Balance Dynamic Standing - Balance Support: During functional activity Dynamic Standing - Level of Assistance: 1: +2 Total assist Extremity/Trunk Assessment RUE Assessment RUE Assessment: Within Functional Limits Active Range of Motion (AROM) Comments: WFLs overall LUE Assessment LUE Assessment: Exceptions to [facility] Passive Range of Motion (PROM) Comments: PROM shoulder flexion 0-140 degrees with slight end range pain. Active Range of Motion (AROM) Comments:  Brunnstrum stage IV in the arm and stage VI in the hand.  Isolate movements in the hand, wrist, and elbow but more limited with shoulder movements.  He is able to oppose the thumb to the index and middle finger but not the ring or little finger.  He needs min assist for use as a gross assist with selfcare tasks. General Strength Comments: Digit flexion 3+/5, with decreased proprioception when holding items.  Elbow flexion extension 3/5, shoulder flexion 2-/5 with motor planning deficits noted.   Green Quincy,Beck OTR/L 11/16/2020, 5:33 PM

## 2020-11-16 NOTE — Progress Notes (Signed)
Speech Language Pathology Discharge Summary  Patient Details  Name: ROBBERT LANGLINAIS MRN: [id number] Date of Birth: 10-30-1945  Today's Date: 11/16/2020 SLP Individual Time: 1400-1445 SLP Individual Time Calculation (min): 45 min   Skilled Therapeutic Interventions:  Patient seen for skilled ST session focusing on cognitive-linguistic goals. He was alert, lying in bed and reporting mild left neck/shoulder pain. Patient was perseverative on talking ao about WC that he would have at home and hoping it would not be similar to one he has been using here because "I dont like that one, its not comfortable". Patient continuously mistook SLP for someone he said "showed me a picture of the wheelchair in a book". He did seem to recognize SLP's face but did not demonstrate knowing anything more. When SLP asking him specific questions about what he has learned, what he thinks will be different at home, etc, patient is not able to give direct answers. He is very verbose, speaking in a circumlocutory manner but never giving specific responses.     Patient has met 3 of 8 long term goals.  Patient to discharge at overall Mod level.  Reasons goals not met:   Patient unfortunately did not progress at rate initially expected and did not exhibit adequate carryover.  Clinical Impression/Discharge Summary: Patient did not progress as initially anticipated and met 3/8 LTG's. He continues to exhibit poor carryover, poor recall from day to day, even having difficulty recognizing familiar faces of clinician's. At time of discharge, he is at supervision to [facility] level for initiating to communicate basic needs, modA and at times mod-maxA for emergent and anticipatory awareness, use of memory strategies. During conversation and structured divergent naming tasks, patient struggles significantly. He perseverates on a thought or something that happened and is not able to give concrete responses to questions. He will instead speak  circumlocutiously and reply in vague terms (ie: when asked what is most important thing he has learned in therapy, he replies that  he has learned "so many things" but is unable to specify. Education was completed with patient's daughter and recommendation is for her to manage his medications completely and for patient to have 24 hour supervision.  Care Partner:  Caregiver Able to Provide Assistance: Yes  Type of Caregiver Assistance: Physical;Cognitive  Recommendation:  24 hour supervision/assistance;Home Health SLP  Rationale for SLP Follow Up: Reduce caregiver burden;Maximize cognitive function and independence   Equipment: N/A   Reasons for discharge: Discharged from hospital   Patient/Family Agrees with Progress Made and Goals Achieved: Yes    Sonia Baller, MA, CCC-SLP Speech Therapy

## 2020-11-16 NOTE — Progress Notes (Signed)
Patient ID: Lance [name], male   DOB: 08/29/1945, 75 y.o.   MRN: [id number]  PTAR scheduled for 10 AM discharge  Erlene Quan, [place]

## 2020-11-16 NOTE — Progress Notes (Signed)
Physical Therapy Session Note  Patient Details  Name: Lance [name] MRN: [id number] Date of Birth: [birth year]  Today's Date: 11/16/2020 PT Individual Time: 1015-1058 1615-1700 PT Individual Time Calculation (min): 43 min and 45   Short Term Goals:  Week 4:  PT Short Term Goal 1 (Week 4): STG= LTG based on ELOS   Skilled Therapeutic Interventions/Progress Updates:   Pt received supine in bed and agreeable to PT.   Rolling R and L for clothing management with min-mod assist for motor planning. Supine>sit transfer with mod assist and max cues for attention to task and use of the RUE to push into sitting  Squat pivot transfer to [facility] with mod assist and cues for sequencing and midline throughout   Sit<>stand with three musketeer assist. Gait training with three musketeer assist x 87f with max cues for attention to task, posture, and advacement of BLE. LLE required to bed blocked intermittently to prevent buckling. .Gertie Exon 25 cm/sec x 4 minutes +2 minutes cues for initiation and attetion to the LLE throughout.   [facility] and all needs met.     Session 2.   Pt received supine in bed and agreeable to PT. Supine>sit transfer with mod assist through log roll on the R cues for attention to the LUE and task. Sitting balance EOB with min assist and RUE supported on bed rail.   Squat pivot transfer to the L with RUE supported on PT shoulder. LLE blocked for safety and mod assist to prevent lateral LOB to the  R. .   [place] and trunkal NMR sitting in WC: ball toss 2 x 1 min  to R and L. Chest press with 1# bar. Trunkal rotation R and L in paddle motion with min-mod assist to improve ROM on the L.  [facility] and all needs met.             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

## 2020-11-16 NOTE — Progress Notes (Signed)
Physical Therapy Discharge Summary  Patient Details  Name: Lance [name] MRN: [id number] Date of Birth: 1945-10-21  Patient has met 4 of 7 long term goals due to improved activity tolerance, improved balance, improved postural control, increased strength, functional use of  left upper extremity and left lower extremity, improved attention, and improved awareness.  Patient to discharge at a wheelchair level  requiring dependent assist for transfers using hoyer lift .   Patient's care partner, [name], attended hands-on education/training and demonstrated independence in  providing the necessary physical and cognitive assistance at discharge.  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 Did not progress to performing wheelchair propulsion as pt requires a custom TIS wheelchair to provide sufficient postural support to maintain sitting balance.  Recommendation:  Patient will benefit from ongoing skilled PT services in home health setting to continue to advance safe functional mobility, address ongoing impairments in midline orientation, sitting balance/trunk control, L UE and LE NMR, transfer training, standing balance, and minimize fall risk.  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 Pain Pain Assessment Pain Scale: Faces Faces Pain Scale: Hurts a little bit Pain Type: Chronic pain Pain Location: Neck Pain Orientation: Left Pain Descriptors / Indicators: Aching;Discomfort Pain Onset: On-going Pain Intervention(s): RN made aware Multiple Pain  Sites: No Perception  Perception Perception: Impaired Inattention/Neglect: Does not attend to left side of body Spatial Orientation: impaired midline orientation Praxis Praxis: Impaired Praxis Impairment Details: Motor planning;Initiation  Cognition Overall Cognitive Status: Impaired/Different from baseline Arousal/Alertness: Awake/alert Orientation Level: Oriented X4 Attention: Focused;Sustained Focused Attention: Appears intact Sustained Attention: Impaired Selective Attention: Impaired Memory: Impaired Awareness: Impaired Problem Solving: Impaired Behaviors: Perseveration Safety/Judgment: Impaired Sensation Sensation Light Touch: Impaired Detail Light Touch Impaired Details: Impaired LLE Hot/Cold: Not tested Proprioception: Impaired Detail Proprioception Impaired Details: Impaired LLE Stereognosis: Not tested Coordination Gross Motor Movements are Fluid and Coordinated: No Fine Motor Movements are Fluid and Coordinated: No Coordination and Movement Description: gross motor movements continues to be impaired due to L hemiplegia, poor trunk control, and impaired midline orientation Heel Shin Test: still unable to do with L LE due to paresis and poor motor planning to initiate open chain movements Motor  Motor Motor: Abnormal postural alignment and control;Hemiplegia Motor - Discharge Observations: L hemiplegia and impaired trunk control  Mobility Bed Mobility Bed Mobility: Supine to Sit;Sit to Supine;Rolling Right;Rolling Left Rolling Right: Moderate Assistance - Patient 50-74% Rolling Left: Contact Guard/Touching assist Supine to Sit: Maximal Assistance - Patient - Patient 25-49% Sit to Supine: Maximal Assistance - Patient 25-49% Transfers Transfers: Sit to Stand;Stand to Sit;Squat Pivot Transfers Sit to Stand: 2 Helpers (+2 max A) Stand to Sit: 2 Helpers (+2 max A) Squat Pivot Transfers: 2 Helpers;Total Assistance - Patient < 25% (variable requiring anywhere from  total assist of 1 to +2 total assist) Transfer (Assistive device): None (intermittent use of slide board but progressing to squat pivots without it) Locomotion  Gait Ambulation: Yes Gait Assistance: 2 Helpers (+2 max assist) Gait Distance (Feet): 25 Feet Assistive device: Other (Comment) (3 Muskteer support) Gait Assistance Details: Manual facilitation for placement;Manual facilitation for weight bearing;Manual facilitation for weight  shifting;Other (comment);Tactile cues for sequencing;Tactile cues for weight shifting;Verbal cues for gait pattern;Verbal cues for technique;Verbal cues for sequencing;Tactile cues for posture;Tactile cues for placement;Tactile cues for weight beaing;Tactile cues for initiation;Visual cues/gestures for sequencing (blocking L knee in stance) Gait Gait: Yes Gait Pattern: Impaired Gait Pattern:  (requires manual facilitation for L LE swing and stance phase mechanics; requires manual facilitation for R/L weight shift) Stairs / Additional Locomotion Stairs: No Wheelchair Mobility Wheelchair Mobility: No (pt required dependent assist for wheelchair mobility in TIS whelechair)  Trunk/Postural Assessment  Cervical Assessment Cervical Assessment: Exceptions to [facility] (pt holds neck in rigid posture with hypertonia noted in L upper traps) Thoracic Assessment Thoracic Assessment: Exceptions to [facility] (thoracic rounding with lateral trunk flexion) Lumbar Assessment Lumbar Assessment: Exceptions to [facility] (posterior pelvic tilt) Postural Control Postural Control: Deficits on evaluation Trunk Control: left pushing with left lean noted in sitting with dynamic sitting and standing tasks.  Balance Balance Balance Assessed: Yes Static Sitting Balance Static Sitting - Balance Support: Feet supported Static Sitting - Level of Assistance: 4: Min assist;Other (comment) (CGA) Dynamic Sitting Balance Dynamic Sitting - Balance Support: Feet supported Dynamic Sitting - Level of  Assistance: 2: Max assist;1: +1 Total assist Static Standing Balance Static Standing - Balance Support: Right upper extremity supported Static Standing - Level of Assistance: 1: +1 Total assist Dynamic Standing Balance Dynamic Standing - Balance Support: During functional activity Dynamic Standing - Level of Assistance: 1: +2 Total assist Extremity Assessment   RLE Assessment RLE Assessment: Exceptions to [facility] Active Range of Motion (AROM) Comments: WFL RLE Strength Right Hip Flexion: 4/5 Right Knee Flexion: 4+/5 Right Knee Extension: 4+/5 Right Ankle Dorsiflexion: 4+/5 Right Ankle Plantar Flexion: 4+/5 LLE Assessment LLE Assessment: Exceptions to [facility] Passive Range of Motion (PROM) Comments: decreased ankle DF ROM LLE Strength Left Hip Flexion: 2-/5 Left Hip ABduction: 0/5 Left Hip ADduction: 1/5 Left Knee Flexion: 1/5 Left Knee Extension: 2-/5 Left Ankle Dorsiflexion: 0/5 Left Ankle Plantar Flexion: 0/5 LLE Tone LLE Tone: Moderate;Hypertonic;Modified [place]    Tawana Scale , PT, DPT, CSRS 11/16/2020, 7:31 PM

## 2020-11-16 NOTE — Progress Notes (Signed)
Occupational Therapy Session Note  Patient Details  Name: Lance [name] MRN: [id number] Date of Birth: 06-25-1945  Today's Date: 11/16/2020 OT Individual Time: 7001-7494 OT Individual Time Calculation (min): 30 min    Short Term Goals: Week 2:  OT Short Term Goal 1 (Week 2): Pt will complete UB bathing with min assist in support sitting for two consecutive sessions. OT Short Term Goal 1 - Progress (Week 2): Met OT Short Term Goal 2 (Week 2): Pt will maintain static sitting balance for 5 mins with no more than mod assist in preparation for selfcare tasks. OT Short Term Goal 2 - Progress (Week 2): Met OT Short Term Goal 3 (Week 2): Pt will use the LUE with bathing tasks with no more than min assist to wash the RUE. OT Short Term Goal 3 - Progress (Week 2): Not met OT Short Term Goal 4 (Week 2): Pt will complete toilet transfer to the drop arm commode with max assist squat pivot. OT Short Term Goal 4 - Progress (Week 2): Not met Week 3:  OT Short Term Goal 1 (Week 3): Pt will use the LUE with bathing tasks with no more than min assist to wash the RUE. OT Short Term Goal 1 - Progress (Week 3): Met OT Short Term Goal 2 (Week 3): Pt will complete toilet transfer to the drop arm commode with max assist squat pivot. OT Short Term Goal 2 - Progress (Week 3): Not met OT Short Term Goal 3 (Week 3): Pt will donn pullover shirt in supported sitting with min assist. OT Short Term Goal 3 - Progress (Week 3): Not met OT Short Term Goal 4 (Week 3): Pt will complete sit to stand for LB selfcare with max assist at the sink or from EOB. OT Short Term Goal 4 - Progress (Week 3): Not met  Skilled Therapeutic Interventions/Progress Updates:    Pt received in TIS in room and consented to OT tx. Pt seen for BUE fine and gross motor control and coordination tasks this day. Pt instructed in box and blocks assessment, scored 20 blocks in 1 min on RUE, 8 blocks in 1 min on LUE. Pt required min encouragement to use  RUE, becomes easily frustrated with intention tremors in LUE. Utilized box and blocks as intervention to focus on LUE fine and gross motor control, pt required increased time to manipulate and transport blocks with LUE. Pt then instructed in 3#db held in both hands, instructed in chest press for 2x10. After tx, pt left up in TIS w/c with seatbelt alarm in place, call light in reach and all needs met.   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

## 2020-11-17 LAB — GLUCOSE, CAPILLARY: Glucose-Capillary: 84 mg/dL (ref 70–99)

## 2020-11-17 NOTE — Progress Notes (Signed)
PROGRESS NOTE   Subjective/Complaints:  Looking Forward to d/c  ROS: Patient denies CP, SOB, N/V/D  Objective:   No results found. No results for input(s): WBC, HGB, HCT, PLT in the last 72 hours. No results for input(s): NA, K, CL, CO2, GLUCOSE, BUN, CREATININE, CALCIUM in the last 72 hours.  Intake/Output Summary (Last 24 hours) at 11/17/2020 0740 Last data filed at 11/16/2020 1842 Gross per 24 hour  Intake 360 ml  Output --  Net 360 ml         Physical Exam: Vital Signs Blood pressure (!) 141/74, pulse 70, temperature 98.1 F (36.7 C), temperature source Oral, resp. rate 18, weight 77.9 kg, SpO2 100 %.   General: No acute distress Mood and affect are appropriate Heart: Regular rate and rhythm no rubs murmurs or extra sounds Lungs: Clear to auscultation, breathing unlabored, no rales or wheezes Abdomen: Positive bowel sounds, soft nontender to palpation, nondistended Extremities: No clubbing, cyanosis, or edema Skin: No evidence of breakdown, no evidence of rash   Neuro: Alert oriented to person and place Motor: RUE/RLE: 5/5 proximal distal LUE: 3- Biceps, triceps and grip- poor release  LLE: trace hip/knee ext synergy   Assessment/Plan: 1. 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 Stable for D/C today F/u PCP in 3-4 weeks F/u PM&R 2 weeks See D/C summary See D/C instructions   Care Tool:  Bathing    Body parts bathed by patient: Right arm, Left arm, Chest, Abdomen, Front perineal area, Right upper leg, Left upper leg, Face   Body parts bathed by helper: Left lower leg, Right lower leg, Buttocks Body parts n/a: Right lower leg, Left lower leg, Right arm, Left arm, Chest, Abdomen   Bathing assist Assist Level: Moderate Assistance - Patient 50 - 74% (sitting with no standing)     Upper Body Dressing/Undressing Upper body dressing   What is the patient wearing?: Pull over shirt    Upper body assist  Assist Level: Maximal Assistance - Patient 25 - 49%    Lower Body Dressing/Undressing Lower body dressing      What is the patient wearing?: Incontinence brief, Pants     Lower body assist Assist for lower body dressing: 2 Helpers (sit to stand)     Chartered loss adjuster assist Assist for toileting: 2 Helpers (sit to stand)     Transfers Chair/bed transfer  Transfers assist  Chair/bed transfer activity did not occur: Safety/medical concerns  Chair/bed transfer assist level: 2 Helpers (squat pivot) Chair/bed transfer assistive device: Other (SPT)   Locomotion Ambulation   Ambulation assist   Ambulation activity did not occur: Safety/medical concerns  Assist level: 2 helpers Assistive device: Other (comment) (3 Musketeer) Max distance: 74ft   Walk 10 feet activity   Assist  Walk 10 feet activity did not occur: Safety/medical concerns  Assist level: 2 helpers Assistive device: Other (comment) (3 Musketeer)   Walk 50 feet activity   Assist Walk 50 feet with 2 turns activity did not occur: Safety/medical concerns         Walk 150 feet activity   Assist Walk 150 feet activity did not occur: Safety/medical concerns  Walk 10 feet on uneven surface  activity   Assist Walk 10 feet on uneven surfaces activity did not occur: Safety/medical concerns         Wheelchair     Assist Will patient use wheelchair at discharge?: Yes Type of Wheelchair: Manual    Wheelchair assist level: Dependent - Patient 0%      Wheelchair 50 feet with 2 turns activity    Assist    Wheelchair 50 feet with 2 turns activity did not occur: Safety/medical concerns   Assist Level: Dependent - Patient 0%   Wheelchair 150 feet activity     Assist  Wheelchair 150 feet activity did not occur: Safety/medical concerns   Assist Level: Dependent - Patient 0%   Blood pressure (!) 141/74, pulse 70, temperature 98.1 F (36.7 C),  temperature source Oral, resp. rate 18, weight 77.9 kg, SpO2 100 %.  Medical Problem List and Plan: 1.  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  Antithrombotics: -DVT/anticoagulation: SCDs             -antiplatelet therapy: N/A 3. Pain management: Tylenol as needed  5/16: topamax 25mg  started at night for headache/insomnia. Lidocaine patch added for coccyx pain, continue, tial of voltaren gel for neck pain  Pain control improved  4. Mood/schizophrenia: Provide emotional support- was on Zyprexa 5 mg at home- will resume QHS which should help with sleep              -antipsychotic agents: continue Zyprexa 5mg  5. Neuropsych: This patient is capable of making decisions on his own behalf. 6. Skin/Wound Care: Routine skin checks 7. Fluids/Electrolytes/Nutrition: Routine in and outs with follow-up chemistries 8.  Hypertension.  Continue Norvasc 10 mg daily Vitals:   11/16/20 2003 11/17/20 0526  BP: 130/76 (!) 141/74  Pulse: 81 70  Resp: 18 18  Temp: 97.8 F (36.6 C) 98.1 F (36.7 C)  SpO2: 100% 100%   BP controlled  6/10 9.  Diabetes mellitus type 2 with hyperglycemia CBG (last 3)  Recent Labs    11/16/20 1700 11/16/20 2109 11/17/20 0554  GLUCAP 93 263* 84    5/29 novolog 10U with meals - controlled 6/10    Levimir to 23U qhs - some lability in pms, may be diet related  10.  History of alcohol polysubstance use.  Urine drug screen positive cocaine.  Provide counseling 11.  History of hepatitis C.  Follow-up outpatient 12. L foot drop- suggest possible AFO, etc and PRAFO to reduce risk of L ankle contracture 13.   Hx of schizophrenia?, was in a "recovery house" in [place] for a year after wife's death, states it was not drug rehab , increase Zyprexa, to  5mg ,saw a "Counselor " as OP  denies seeing psychiatry  14.  Urinary freq , small amt will trial flomax, incont  5/29 low grade tempx 1, negative  UA, UCX multispecies 15. Insomnia:  Continue melatonin 3mg  HS, with some improvement 16. Left cervical myofascial pain: ordered voltaren gel.   .  LOS: 29 days A FACE TO FACE EVALUATION WAS PERFORMED  Charlett Blake 11/17/2020, 7:40 AM

## 2020-11-17 NOTE — Progress Notes (Signed)
Inpatient Rehabilitation Care Coordinator Discharge Note  The overall goal for the admission was met for:   Discharge location: Yes, home  Length of Stay: Yes, 29 Days  Discharge activity level: Yes,  wheelchair level   Home/community participation: Yes  Services provided included: MD, RD, PT, OT, SLP, RN, CM, TR, Pharmacy, Neuropsych, and SW  Financial Services: Private Insurance: [employer]   Choices offered to/list presented to:Pt daughter [name])  Follow-up services arranged: Home Health: [facility]   Comments (or additional information): PT OT [facility]   Patient/Family verbalized understanding of follow-up arrangements: Yes  Individual responsible for coordination of the follow-up plan: [name] [phone]  Confirmed correct DME delivered: [name] 11/17/2020    [name]

## 2020-11-20 ENCOUNTER — Encounter: Payer: Self-pay | Admitting: Registered Nurse

## 2020-12-04 ENCOUNTER — Encounter: Payer: Medicare HMO | Attending: Registered Nurse | Admitting: Registered Nurse

## 2020-12-18 ENCOUNTER — Emergency Department: Payer: Medicare HMO

## 2020-12-18 ENCOUNTER — Other Ambulatory Visit: Payer: Self-pay

## 2020-12-18 ENCOUNTER — Emergency Department
Admission: EM | Admit: 2020-12-18 | Discharge: 2020-12-18 | Disposition: A | Discharge status: Home / Self Care | Payer: Medicare HMO | Attending: Emergency Medicine | Admitting: Emergency Medicine

## 2020-12-18 DIAGNOSIS — E119 Type 2 diabetes mellitus without complications: Secondary | ICD-10-CM | POA: Diagnosis not present

## 2020-12-18 DIAGNOSIS — R202 Paresthesia of skin: Secondary | ICD-10-CM | POA: Insufficient documentation

## 2020-12-18 DIAGNOSIS — Z79899 Other long term (current) drug therapy: Secondary | ICD-10-CM | POA: Diagnosis not present

## 2020-12-18 DIAGNOSIS — I10 Essential (primary) hypertension: Secondary | ICD-10-CM | POA: Diagnosis not present

## 2020-12-18 DIAGNOSIS — F17210 Nicotine dependence, cigarettes, uncomplicated: Secondary | ICD-10-CM | POA: Diagnosis not present

## 2020-12-18 DIAGNOSIS — Z794 Long term (current) use of insulin: Secondary | ICD-10-CM | POA: Diagnosis not present

## 2020-12-18 DIAGNOSIS — M542 Cervicalgia: Secondary | ICD-10-CM

## 2020-12-18 DIAGNOSIS — R519 Headache, unspecified: Secondary | ICD-10-CM | POA: Insufficient documentation

## 2020-12-18 MED ORDER — DIAPERS & SUPPLIES MISC: 1.0000 [IU] | 0 refills | Status: AC | PRN

## 2020-12-18 MED ORDER — ACETAMINOPHEN 325 MG PO TABS
650.0000 mg | ORAL_TABLET | Freq: Once | ORAL | Status: AC
  Administered 2020-12-18: 650 mg via ORAL
  Filled 2020-12-18: qty 2

## 2020-12-18 NOTE — ED Notes (Signed)
Pt called and no answer at this time.

## 2020-12-18 NOTE — ED Provider Notes (Signed)
[facility] Emergency Department Provider Note   ____________________________________________   Event Date/Time   First MD Initiated Contact with Patient 12/18/20 1753     (approximate)  I have reviewed the triage vital signs and the nursing notes.   HISTORY  Chief Complaint Headache    HPI BERTRUM HELMSTETTER is a 75 y.o. male who presents for headache and neck pain  LOCATION: Generalized head and left posterior neck DURATION: 2 months TIMING: Intermittent SEVERITY: 7/10 QUALITY: Aching CONTEXT: Patient admitted to the hospital on 10/14/2020 for a nontraumatic intracerebral hemorrhage.  Patient has been having headaches and neck pain since this time MODIFYING FACTORS: Denies ASSOCIATED SYMPTOMS: Left lower extremity paresthesia   Per medical record review patient has history of of [place]          Past Medical History:  Diagnosis Date   Diabetes mellitus without complication ([place])    Hepatitis C    Schizophrenia ([place])     Patient Active Problem List   Diagnosis Date Noted   Controlled type 2 diabetes mellitus with hyperglycemia ([place])    Essential hypertension    Vascular headache    Spastic hemiparesis ([place])    Intraparenchymal hemorrhage of brain ([place]) 10/19/2020   Left hemiplegia ([place]) 10/19/2020   ICH (intracerebral hemorrhage) ([place]) 10/14/2020   Weakness    Acute metabolic encephalopathy    Silent micro-hemorrhage of brain ([place])    Functional assessment declined    Malnutrition of moderate degree 05/26/2020   Subacute delirium 05/16/2020   Syncope 05/15/2020   Sepsis ([place]) 05/14/2020   Leg pain 12/07/2018   PAD (peripheral artery disease) ([place]) 12/07/2018   Acute upper GI bleed 10/10/2016   Hypotension    Polysubstance abuse ([place]) 03/10/2015   Mass of arm 03/10/2015   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 03/10/2015   Perianal abscess 08/31/2014   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 ([place]) 07/07/2014   Adenomatous polyp of colon 08/30/2013    Type 2 diabetes mellitus with hyperglycemia, without long-term current use of insulin ([place]) 08/26/2013   Hepatitis C virus infection without hepatic coma 03/21/2013   Need for hepatitis B vaccination 03/21/2013   Depression 10/23/2012   IVDU (intravenous drug user) 10/23/2012   Schizophrenia ([place]) 10/23/2012   Carpal tunnel syndrome on both sides 07/31/2012   Diabetic peripheral neuropathy ([place]) 07/31/2012   Osteoarthritis of both hips 07/31/2012   Esophageal reflux 10/21/2009   Diverticulitis of colon 10/21/2009    Past Surgical History:  Procedure Laterality Date   COLONOSCOPY WITH PROPOFOL N/A 08/15/2020   Procedure: COLONOSCOPY WITH PROPOFOL;  Surgeon: Lucilla Lame, MD;  Location: [facility] ENDOSCOPY;  Service: Endoscopy;  Laterality: N/A;   ESOPHAGOGASTRODUODENOSCOPY (EGD) WITH PROPOFOL N/A 10/10/2016   Procedure: ESOPHAGOGASTRODUODENOSCOPY (EGD) WITH PROPOFOL;  Surgeon: Lucilla Lame, MD;  Location: [facility] ENDOSCOPY;  Service: Endoscopy;  Laterality: N/A;   HERNIA REPAIR     VISCERAL ARTERY INTERVENTION N/A 10/11/2016   Procedure: Visceral Artery Intervention;  Surgeon: Algernon Huxley, MD;  Location: [place] CV LAB;  Service: Cardiovascular;  Laterality: N/A;    Prior to Admission medications   Medication Sig Start Date End Date Taking? Authorizing Provider  acetaminophen (TYLENOL) 325 MG tablet Take 2 tablets (650 mg total) by mouth every 4 (four) hours as needed for mild pain (or temp > 37.5 C (99.5 F)). 11/16/20   Angiulli, Lavon Paganini, PA-C  acetaminophen (TYLENOL) 650 MG CR tablet Take 650 mg by mouth every 8 (eight) hours as needed for pain.  [provider]  albuterol (VENTOLIN HFA) 108 (90 Base) MCG/ACT inhaler Inhale 2 puffs into the lungs every 6 (six) hours as needed for wheezing or shortness of breath. 11/16/20   Angiulli, Lavon Paganini, PA-C  amLODipine (NORVASC) 10 MG tablet Take 1 tablet (10 mg total) by mouth daily. 11/16/20   Angiulli, Lavon Paganini, PA-C  atorvastatin (LIPITOR) 20  MG tablet Take 1 tablet (20 mg total) by mouth daily. 11/16/20   Angiulli, Lavon Paganini, PA-C  cyanocobalamin 1000 MCG tablet Take 1 tablet (1,000 mcg total) by mouth daily. 11/16/20   Angiulli, Lavon Paganini, PA-C  diclofenac Sodium (VOLTAREN) 1 % GEL Apply 2 g topically 4 (four) times daily. 11/16/20   Angiulli, Lavon Paganini, PA-C  ferrous sulfate 325 (65 FE) MG tablet Take 1 tablet (325 mg total) by mouth daily with breakfast. 11/16/20   Angiulli, Lavon Paganini, PA-C  gabapentin (NEURONTIN) 100 MG capsule Take 1 capsule (100 mg total) by mouth 3 (three) times daily. 11/16/20   Angiulli, Lavon Paganini, PA-C  insulin aspart (NOVOLOG FLEXPEN) 100 UNIT/ML FlexPen Inject 10 Units into the skin 3 (three) times daily with meals. 11/16/20   Angiulli, Lavon Paganini, PA-C  insulin detemir (LEVEMIR FLEXTOUCH) 100 UNIT/ML FlexPen Inject 23 Units into the skin at bedtime. 11/16/20   Angiulli, Lavon Paganini, PA-C  lidocaine (LIDODERM) 5 % Place 1 patch onto the skin daily. Remove & Discard patch within 12 hours or as directed by MD 11/16/20   Angiulli, Lavon Paganini, PA-C  melatonin 3 MG TABS tablet Take 1 tablet (3 mg total) by mouth at bedtime. 11/16/20   Angiulli, Lavon Paganini, PA-C  Multiple Vitamin (MULTI-VITAMIN) tablet Take 1 tablet by mouth daily.    [provider]  OLANZapine (ZYPREXA) 5 MG tablet Take 1 tablet (5 mg total) by mouth at bedtime. 11/16/20   Angiulli, Lavon Paganini, PA-C  pantoprazole (PROTONIX) 40 MG tablet Take 1 tablet (40 mg total) by mouth at bedtime. 11/16/20   Angiulli, Lavon Paganini, PA-C  polyethylene glycol (MIRALAX / GLYCOLAX) 17 g packet Take 17 g by mouth daily as needed for mild constipation. 06/15/20   Loletha Grayer, MD  polyvinyl alcohol (ARTIFICIAL TEARS) 1.4 % ophthalmic solution Place 1 drop into both eyes as needed for dry eyes. 06/15/20   Loletha Grayer, MD  senna-docusate (SENOKOT-S) 8.6-50 MG tablet Take 1 tablet by mouth 2 (two) times daily. 11/16/20   Angiulli, Lavon Paganini, PA-C  tamsulosin (FLOMAX) 0.4 MG CAPS capsule Take 1  capsule (0.4 mg total) by mouth daily after supper. 11/16/20   Angiulli, Lavon Paganini, PA-C  tiZANidine (ZANAFLEX) 2 MG tablet Take 1 tablet (2 mg total) by mouth 4 (four) times daily -  with meals and at bedtime. 11/16/20   Angiulli, Lavon Paganini, PA-C  traMADol (ULTRAM) 50 MG tablet Take 1 tablet (50 mg total) by mouth every 12 (twelve) hours as needed for moderate pain. 11/16/20   Angiulli, Lavon Paganini, PA-C    Allergies Patient has no known allergies.  Family History  Problem Relation Age of Onset   Diabetes Sister     Social History Social History   Tobacco Use   Smoking status: Every Day    Packs/day: 0.25    Years: 10.00    Pack years: 2.50    Types: Cigarettes   Smokeless tobacco: Never  Vaping Use   Vaping Use: Never used  Substance Use Topics   Alcohol use: Not Currently    Alcohol/week: 10.0 standard drinks  Types: 10 Shots of liquor per week   Drug use: No    Review of Systems Constitutional: No fever/chills Eyes: No visual changes. ENT: No sore throat. Cardiovascular: Denies chest pain. Respiratory: Denies shortness of breath. Gastrointestinal: No abdominal pain.  No nausea, no vomiting.  No diarrhea. Genitourinary: Negative for dysuria. Musculoskeletal: Negative for acute arthralgias Skin: Negative for rash. Neurological: Positive for headaches and intermittent left lower extremity paresthesia, weakness/numbness in any extremity Psychiatric: Negative for suicidal ideation/homicidal ideation   ____________________________________________   PHYSICAL EXAM:  VITAL SIGNS: ED Triage Vitals  Enc Vitals Group     BP 12/18/20 1522 135/89     Pulse Rate 12/18/20 1522 95     Resp 12/18/20 1522 18     Temp 12/18/20 1522 98.9 F (37.2 C)     Temp Source 12/18/20 1522 Oral     SpO2 12/18/20 1522 100 %     Weight 12/18/20 1520 207 lb (93.9 kg)     Height 12/18/20 1520 5\' 9"  (1.753 m)     Head Circumference --      Peak Flow --      Pain Score 12/18/20 1519 7     Pain  Loc --      Pain Edu? --      Excl. in [place]? --    Constitutional: Alert and oriented. Well appearing and in no acute distress. Eyes: Conjunctivae are normal. PERRL. Head: Atraumatic. Nose: No congestion/rhinnorhea. Mouth/Throat: Mucous membranes are moist. Neck: No stridor Cardiovascular: Grossly normal heart sounds.  Good peripheral circulation. Respiratory: Normal respiratory effort.  No retractions. Gastrointestinal: Soft and nontender. No distention. Musculoskeletal: No obvious deformities Neurologic:  Normal speech and language. No gross focal neurologic deficits are appreciated. Skin:  Skin is warm and dry. No rash noted. Psychiatric: Mood and affect are normal. Speech and behavior are normal.  ____________________________________________   LABS (all labs ordered are listed, but only abnormal results are displayed)  Labs Reviewed - No data to display ____________________________________________ RADIOLOGY  ED MD interpretation: CT of the head and neck without contrast shows no acute intracranial abnormalities with an unchanged appearance of right anterior convexity meningioma and no acute fracture or subluxation of the cervical spine  Official radiology report(s): CT Head Wo Contrast  Result Date: 12/18/2020 CLINICAL DATA:  Headache EXAM: CT HEAD WITHOUT CONTRAST CT CERVICAL SPINE WITHOUT CONTRAST TECHNIQUE: Multidetector CT imaging of the head and cervical spine was performed following the standard protocol without intravenous contrast. Multiplanar CT image reconstructions of the cervical spine were also generated. COMPARISON:  None. 10/14/2020 FINDINGS: CT HEAD FINDINGS Brain: There is no mass, hemorrhage or extra-axial collection. The size and configuration of the ventricles and extra-axial CSF spaces are normal. There is hypoattenuation of the white matter, most commonly indicating chronic small vessel disease. There is a anterior right convexity meningioma that measures 21 mm  in thickness, unchanged. Vascular: No abnormal hyperdensity of the major intracranial arteries or dural venous sinuses. No intracranial atherosclerosis. Skull: The visualized skull base, calvarium and extracranial soft tissues are normal. Sinuses/Orbits: No fluid levels or advanced mucosal thickening of the visualized paranasal sinuses. No mastoid or middle ear effusion. The orbits are normal. CT CERVICAL SPINE FINDINGS Alignment: No static subluxation. Facets are aligned. Occipital condyles are normally positioned. Skull base and vertebrae: No acute fracture. Soft tissues and spinal canal: No prevertebral fluid or swelling. No visible canal hematoma. Disc levels: No advanced spinal canal or neural foraminal stenosis. Upper chest: No pneumothorax, pulmonary nodule or  pleural effusion. Other: Normal visualized paraspinal cervical soft tissues. IMPRESSION: 1. No acute intracranial abnormality. 2. 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 3. Advanced chronic small vessel disease. 4. No acute fracture or static subluxation of the cervical spine. Electronically Signed   By: Ulyses Jarred M.D.   On: 12/18/2020 19:56   CT Cervical Spine Wo Contrast  Result Date: 12/18/2020 CLINICAL DATA:  Headache EXAM: CT HEAD WITHOUT CONTRAST CT CERVICAL SPINE WITHOUT CONTRAST TECHNIQUE: Multidetector CT imaging of the head and cervical spine was performed following the standard protocol without intravenous contrast. Multiplanar CT image reconstructions of the cervical spine were also generated. COMPARISON:  None. 10/14/2020 FINDINGS: CT HEAD FINDINGS Brain: There is no mass, hemorrhage or extra-axial collection. The size and configuration of the ventricles and extra-axial CSF spaces are normal. There is hypoattenuation of the white matter, most commonly indicating chronic small vessel disease. There is a anterior right convexity meningioma that measures 21 mm in thickness, unchanged. Vascular: No abnormal  hyperdensity of the major intracranial arteries or dural venous sinuses. No intracranial atherosclerosis. Skull: The visualized skull base, calvarium and extracranial soft tissues are normal. Sinuses/Orbits: No fluid levels or advanced mucosal thickening of the visualized paranasal sinuses. No mastoid or middle ear effusion. The orbits are normal. CT CERVICAL SPINE FINDINGS Alignment: No static subluxation. Facets are aligned. Occipital condyles are normally positioned. Skull base and vertebrae: No acute fracture. Soft tissues and spinal canal: No prevertebral fluid or swelling. No visible canal hematoma. Disc levels: No advanced spinal canal or neural foraminal stenosis. Upper chest: No pneumothorax, pulmonary nodule or pleural effusion. Other: Normal visualized paraspinal cervical soft tissues. IMPRESSION: 1. No acute intracranial abnormality. 2. 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 3. Advanced chronic small vessel disease. 4. No acute fracture or static subluxation of the cervical spine. Electronically Signed   By: Ulyses Jarred M.D.   On: 12/18/2020 19:56    ____________________________________________   PROCEDURES  Procedure(s) performed (including Critical Care):  .1-3 Lead EKG Interpretation  Date/Time: 12/18/2020 8:28 PM Performed by: Naaman Plummer, MD Authorized by: Naaman Plummer, MD     Interpretation: normal     ECG rate:  88   ECG rate assessment: normal     Rhythm: sinus rhythm     Ectopy: none     Conduction: normal     ____________________________________________   INITIAL IMPRESSION / ASSESSMENT AND PLAN / ED COURSE  As part of my medical decision making, I reviewed the following data within the electronic medical record, if available:  Nursing notes reviewed and incorporated, Labs reviewed, EKG interpreted, Old chart reviewed, Radiograph reviewed and Notes from prior ED visits reviewed and incorporated      75 year old male with a history of  intracranial hemorrhage on 10/14/20 presents with Headache.  No focal neurological symptoms. Neuro exam is benign. Pt is nontoxic. VSS.  Based on history and normal neurological exam I have low suspicion for intracranial tumor, intracranial bleed, meningitis, temporal arteritis, glaucoma, CO poisoning.  Most likely patient has benign headache, recommend rest, hydration, and ibuprofen.  Disposition: Discharge home with strict return precautions and instructions for prompt primary care follow up.     ____________________________________________   FINAL CLINICAL IMPRESSION(S) / ED DIAGNOSES  Final diagnoses:  Acute nonintractable headache, unspecified headache type  Neck pain     ED Discharge Orders     None        Note:  This document was prepared using Dragon voice recognition software  and may include unintentional dictation errors.    Naaman Plummer, MD 12/18/20 2028

## 2020-12-18 NOTE — ED Notes (Addendum)
Writer in room to move pt to hallway for wait on transport back to home. Pt found by writer sitting on buttocks, on floor at the end of bed. Pt reports he was trying to get out of bed and knew he couldn't walk so he lowered himself down the end of the bed until he got to the floor. Cheri Fowler, MD in room as well as Larkin Ina, RN to Artist with assisting pt back to bed and to assess for injury. Pt not c/o pain. No injury found. Pt in bed, moved to hallway 12 at nurses station, fall bracelet on, fall alarm on, non-skid socks on. Larkin Ina, RN to call family for update.

## 2020-12-18 NOTE — ED Notes (Signed)
This RN went into the pts room with CN, to find the pt sitting on the ground . Pt states he was not hurt and needed help back in bed. The pt states he just slid down the bed resting on his bottom. No trauma noted . Pt assisted back into bed and moved to hallway in view of RN with [place] attached and Fall kit applied

## 2020-12-18 NOTE — ED Triage Notes (Signed)
Pt comes into the ED via EMS from home with c/o Head and neck pain today , hx of stroke .Marland Kitchen   115HR 135/82 99%RA

## 2020-12-18 NOTE — ED Notes (Signed)
Tried to call daughter [name] to let her know the patient was on his way and and to report that he had slid out of bed . No answer and went striaght to VM l . The number I had was [phone]

## 2020-12-20 ENCOUNTER — Emergency Department: Payer: Medicare HMO

## 2020-12-20 ENCOUNTER — Encounter: Payer: Self-pay | Admitting: Emergency Medicine

## 2020-12-20 ENCOUNTER — Emergency Department
Admission: EM | Admit: 2020-12-20 | Discharge: 2020-12-20 | Disposition: A | Discharge status: Home / Self Care | Payer: Medicare HMO | Attending: Emergency Medicine | Admitting: Emergency Medicine

## 2020-12-20 ENCOUNTER — Other Ambulatory Visit: Payer: Self-pay

## 2020-12-20 DIAGNOSIS — R1031 Right lower quadrant pain: Secondary | ICD-10-CM | POA: Diagnosis present

## 2020-12-20 DIAGNOSIS — R519 Headache, unspecified: Secondary | ICD-10-CM | POA: Diagnosis not present

## 2020-12-20 DIAGNOSIS — Z79899 Other long term (current) drug therapy: Secondary | ICD-10-CM | POA: Insufficient documentation

## 2020-12-20 DIAGNOSIS — E1140 Type 2 diabetes mellitus with diabetic neuropathy, unspecified: Secondary | ICD-10-CM | POA: Insufficient documentation

## 2020-12-20 DIAGNOSIS — Z794 Long term (current) use of insulin: Secondary | ICD-10-CM | POA: Diagnosis not present

## 2020-12-20 DIAGNOSIS — F17210 Nicotine dependence, cigarettes, uncomplicated: Secondary | ICD-10-CM | POA: Diagnosis not present

## 2020-12-20 DIAGNOSIS — M542 Cervicalgia: Secondary | ICD-10-CM | POA: Insufficient documentation

## 2020-12-20 DIAGNOSIS — K219 Gastro-esophageal reflux disease without esophagitis: Secondary | ICD-10-CM | POA: Insufficient documentation

## 2020-12-20 DIAGNOSIS — R1084 Generalized abdominal pain: Secondary | ICD-10-CM

## 2020-12-20 DIAGNOSIS — R110 Nausea: Secondary | ICD-10-CM | POA: Insufficient documentation

## 2020-12-20 DIAGNOSIS — K5641 Fecal impaction: Secondary | ICD-10-CM | POA: Diagnosis not present

## 2020-12-20 DIAGNOSIS — I10 Essential (primary) hypertension: Secondary | ICD-10-CM | POA: Insufficient documentation

## 2020-12-20 HISTORY — DX: Cerebral infarction, unspecified: I63.9

## 2020-12-20 LAB — CBC
HCT: 39 % (ref 39.0–52.0)
Hemoglobin: 13.5 g/dL (ref 13.0–17.0)
MCH: 30.5 pg (ref 26.0–34.0)
MCHC: 34.6 g/dL (ref 30.0–36.0)
MCV: 88 fL (ref 80.0–100.0)
Platelets: 231 10*3/uL (ref 150–400)
RBC: 4.43 MIL/uL (ref 4.22–5.81)
RDW: 11.9 % (ref 11.5–15.5)
WBC: 9.6 10*3/uL (ref 4.0–10.5)
nRBC: 0 % (ref 0.0–0.2)

## 2020-12-20 LAB — COMPREHENSIVE METABOLIC PANEL
ALT: 16 U/L (ref 0–44)
AST: 16 U/L (ref 15–41)
Albumin: 3.3 g/dL — ABNORMAL LOW (ref 3.5–5.0)
Alkaline Phosphatase: 122 U/L (ref 38–126)
Anion gap: 9 (ref 5–15)
BUN: 17 mg/dL (ref 8–23)
CO2: 27 mmol/L (ref 22–32)
Calcium: 9.3 mg/dL (ref 8.9–10.3)
Chloride: 104 mmol/L (ref 98–111)
Creatinine, Ser: 0.87 mg/dL (ref 0.61–1.24)
GFR, Estimated: >60 mL/min (ref >60)
Glucose, Bld: 96 mg/dL (ref 70–99)
Potassium: 3.5 mmol/L (ref 3.5–5.1)
Sodium: 140 mmol/L (ref 135–145)
Total Bilirubin: 0.7 mg/dL (ref 0.3–1.2)
Total Protein: 8.1 g/dL (ref 6.5–8.1)

## 2020-12-20 LAB — URINALYSIS, COMPLETE (UACMP) WITH MICROSCOPIC
Bilirubin Urine: NEGATIVE
Glucose, UA: 50 mg/dL — AB
Ketones, ur: 5 mg/dL — AB
Leukocytes,Ua: NEGATIVE
Nitrite: NEGATIVE
Protein, ur: 100 mg/dL — AB
Specific Gravity, Urine: >1.046 — ABNORMAL HIGH (ref 1.005–1.030)
pH: 5 (ref 5.0–8.0)

## 2020-12-20 LAB — LIPASE, BLOOD: Lipase: 26 U/L (ref 11–51)

## 2020-12-20 MED ORDER — DOCUSATE SODIUM 100 MG PO CAPS: 100.0000 mg | ORAL_CAPSULE | Freq: Two times a day (BID) | ORAL | 2 refills | Status: AC

## 2020-12-20 MED ORDER — LACTATED RINGERS IV BOLUS
1000.0000 mL | Freq: Once | INTRAVENOUS | Status: AC
  Administered 2020-12-20: 1000 mL via INTRAVENOUS

## 2020-12-20 MED ORDER — PROCHLORPERAZINE EDISYLATE 10 MG/2ML IJ SOLN
10.0000 mg | Freq: Once | INTRAMUSCULAR | Status: AC
  Administered 2020-12-20: 10 mg via INTRAVENOUS
  Filled 2020-12-20: qty 2

## 2020-12-20 MED ORDER — DIPHENHYDRAMINE HCL 50 MG/ML IJ SOLN
25.0000 mg | Freq: Once | INTRAMUSCULAR | Status: AC
  Administered 2020-12-20: 25 mg via INTRAVENOUS
  Filled 2020-12-20: qty 1

## 2020-12-20 MED ORDER — IOHEXOL 350 MG/ML SOLN
100.0000 mL | Freq: Once | INTRAVENOUS | Status: AC | PRN
  Administered 2020-12-20: 100 mL via INTRAVENOUS

## 2020-12-20 NOTE — ED Notes (Signed)
Pt given sandwich tray 

## 2020-12-20 NOTE — ED Notes (Signed)
ED Provider at bedside. 

## 2020-12-20 NOTE — ED Provider Notes (Signed)
[facility] Emergency Department Provider Note   ____________________________________________   Event Date/Time   First MD Initiated Contact with Patient 12/20/20 1533     (approximate)  I have reviewed the triage vital signs and the nursing notes.   HISTORY  Chief Complaint Abdominal Pain and Headache    HPI Lance [name] is a 75 y.o. male with past medical history of hypertension, hyperlipidemia, diabetes, hepatitis C, hemorrhagic stroke, schizophrenia, and polysubstance abuse who presents to the ED complaining of headache and abdominal pain.  Patient reports that he has been dealing with pain in his head and posterior neck for the past couple of days, was seen in the ED for this 2 days ago with unremarkable work-up and has been using Lidoderm patches without significant relief since then.  He does state that he attempted to reach the remote under his bed today, when he fell and hit his head.  This resulted in worsening of his headache along with his neck pain.  He has chronic weakness in his left lower extremity secondary to recent hemorrhagic stroke, denies any new numbness or weakness.  He also reports onset of bilateral lower quadrant abdominal pain earlier today.  He describes his pain as constant and sharp, not exacerbated or alleviated by anything.  It has been associated with nausea but he has not vomited and he denies any dysuria, flank pain, or changes in bowel movements.        Past Medical History:  Diagnosis Date   CVA (cerebral vascular accident) ([place])    Diabetes mellitus without complication ([place])    Hepatitis C    Schizophrenia ([place])     Patient Active Problem List   Diagnosis Date Noted   Controlled type 2 diabetes mellitus with hyperglycemia ([place])    Essential hypertension    Vascular headache    Spastic hemiparesis ([place])    Intraparenchymal hemorrhage of brain ([place]) 10/19/2020   Left hemiplegia ([place]) 10/19/2020   ICH  (intracerebral hemorrhage) ([place]) 10/14/2020   Weakness    Acute metabolic encephalopathy    Silent micro-hemorrhage of brain ([place])    Functional assessment declined    Malnutrition of moderate degree 05/26/2020   Subacute delirium 05/16/2020   Syncope 05/15/2020   Sepsis ([place]) 05/14/2020   Leg pain 12/07/2018   PAD (peripheral artery disease) ([place]) 12/07/2018   Acute upper GI bleed 10/10/2016   Hypotension    Polysubstance abuse ([place]) 03/10/2015   Mass of arm 03/10/2015   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 03/10/2015   Perianal abscess 08/31/2014   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 ([place]) 07/07/2014   Adenomatous polyp of colon 08/30/2013   Type 2 diabetes mellitus with hyperglycemia, without long-term current use of insulin ([place]) 08/26/2013   Hepatitis C virus infection without hepatic coma 03/21/2013   Need for hepatitis B vaccination 03/21/2013   Depression 10/23/2012   IVDU (intravenous drug user) 10/23/2012   Schizophrenia ([place]) 10/23/2012   Carpal tunnel syndrome on both sides 07/31/2012   Diabetic peripheral neuropathy ([place]) 07/31/2012   Osteoarthritis of both hips 07/31/2012   Esophageal reflux 10/21/2009   Diverticulitis of colon 10/21/2009    Past Surgical History:  Procedure Laterality Date   COLONOSCOPY WITH PROPOFOL N/A 08/15/2020   Procedure: COLONOSCOPY WITH PROPOFOL;  Surgeon: Lucilla Lame, MD;  Location: [facility];  Service: Endoscopy;  Laterality: N/A;   ESOPHAGOGASTRODUODENOSCOPY (EGD) WITH PROPOFOL N/A 10/10/2016   Procedure: ESOPHAGOGASTRODUODENOSCOPY (EGD) WITH PROPOFOL;  Surgeon: Lucilla Lame, MD;  Location: [facility] ENDOSCOPY;  Service: Endoscopy;  Laterality: N/A;   HERNIA REPAIR     VISCERAL ARTERY INTERVENTION N/A 10/11/2016   Procedure: Visceral Artery Intervention;  Surgeon: Algernon Huxley, MD;  Location: [place] [facility];  Service: Cardiovascular;  Laterality: N/A;    Prior to Admission medications   Medication Sig Start Date End Date Taking? Authorizing Provider   docusate sodium (COLACE) 100 MG capsule Take 1 capsule (100 mg total) by mouth 2 (two) times daily. 12/20/20 12/20/21 Yes Blake Divine, MD  acetaminophen (TYLENOL) 325 MG tablet Take 2 tablets (650 mg total) by mouth every 4 (four) hours as needed for mild pain (or temp > 37.5 C (99.5 F)). 11/16/20   Angiulli, Lavon Paganini, PA-C  acetaminophen (TYLENOL) 650 MG CR tablet Take 650 mg by mouth every 8 (eight) hours as needed for pain.    [provider]  albuterol (VENTOLIN HFA) 108 (90 Base) MCG/ACT inhaler Inhale 2 puffs into the lungs every 6 (six) hours as needed for wheezing or shortness of breath. 11/16/20   Angiulli, Lavon Paganini, PA-C  amLODipine (NORVASC) 10 MG tablet Take 1 tablet (10 mg total) by mouth daily. 11/16/20   Angiulli, Lavon Paganini, PA-C  atorvastatin (LIPITOR) 20 MG tablet Take 1 tablet (20 mg total) by mouth daily. 11/16/20   Angiulli, Lavon Paganini, PA-C  cyanocobalamin 1000 MCG tablet Take 1 tablet (1,000 mcg total) by mouth daily. 11/16/20   Angiulli, Lavon Paganini, PA-C  Diapers & Supplies MISC 1 Units by Does not apply route every 2 (two) hours as needed. 12/18/20 01/17/21  Naaman Plummer, MD  diclofenac Sodium (VOLTAREN) 1 % GEL Apply 2 g topically 4 (four) times daily. 11/16/20   Angiulli, Lavon Paganini, PA-C  ferrous sulfate 325 (65 FE) MG tablet Take 1 tablet (325 mg total) by mouth daily with breakfast. 11/16/20   Angiulli, Lavon Paganini, PA-C  gabapentin (NEURONTIN) 100 MG capsule Take 1 capsule (100 mg total) by mouth 3 (three) times daily. 11/16/20   Angiulli, Lavon Paganini, PA-C  insulin aspart (NOVOLOG FLEXPEN) 100 UNIT/ML FlexPen Inject 10 Units into the skin 3 (three) times daily with meals. 11/16/20   Angiulli, Lavon Paganini, PA-C  insulin detemir (LEVEMIR FLEXTOUCH) 100 UNIT/ML FlexPen Inject 23 Units into the skin at bedtime. 11/16/20   Angiulli, Lavon Paganini, PA-C  lidocaine (LIDODERM) 5 % Place 1 patch onto the skin daily. Remove & Discard patch within 12 hours or as directed by MD 11/16/20   Angiulli, Lavon Paganini, PA-C   melatonin 3 MG TABS tablet Take 1 tablet (3 mg total) by mouth at bedtime. 11/16/20   Angiulli, Lavon Paganini, PA-C  Multiple Vitamin (MULTI-VITAMIN) tablet Take 1 tablet by mouth daily.    [provider]  OLANZapine (ZYPREXA) 5 MG tablet Take 1 tablet (5 mg total) by mouth at bedtime. 11/16/20   Angiulli, Lavon Paganini, PA-C  pantoprazole (PROTONIX) 40 MG tablet Take 1 tablet (40 mg total) by mouth at bedtime. 11/16/20   Angiulli, Lavon Paganini, PA-C  polyethylene glycol (MIRALAX / GLYCOLAX) 17 g packet Take 17 g by mouth daily as needed for mild constipation. 06/15/20   Loletha Grayer, MD  polyvinyl alcohol (ARTIFICIAL TEARS) 1.4 % ophthalmic solution Place 1 drop into both eyes as needed for dry eyes. 06/15/20   Loletha Grayer, MD  senna-docusate (SENOKOT-S) 8.6-50 MG tablet Take 1 tablet by mouth 2 (two) times daily. 11/16/20   Angiulli, Lavon Paganini, PA-C  tamsulosin (FLOMAX) 0.4 MG CAPS capsule Take 1 capsule (0.4 mg  total) by mouth daily after supper. 11/16/20   Angiulli, Lavon Paganini, PA-C  tiZANidine (ZANAFLEX) 2 MG tablet Take 1 tablet (2 mg total) by mouth 4 (four) times daily -  with meals and at bedtime. 11/16/20   Angiulli, Lavon Paganini, PA-C  traMADol (ULTRAM) 50 MG tablet Take 1 tablet (50 mg total) by mouth every 12 (twelve) hours as needed for moderate pain. 11/16/20   Angiulli, Lavon Paganini, PA-C    Allergies Patient has no known allergies.  Family History  Problem Relation Age of Onset   Diabetes Sister     Social History Social History   Tobacco Use   Smoking status: Every Day    Packs/day: 0.25    Years: 10.00    Pack years: 2.50    Types: Cigarettes   Smokeless tobacco: Never  Vaping Use   Vaping Use: Never used  Substance Use Topics   Alcohol use: Not Currently    Alcohol/week: 10.0 standard drinks    Types: 10 Shots of liquor per week   Drug use: No    Review of Systems  Constitutional: No fever/chills Eyes: No visual changes. ENT: No sore throat. Cardiovascular: Denies chest  pain. Respiratory: Denies shortness of breath. Gastrointestinal: Positive for abdominal pain and nausea, no vomiting.  No diarrhea.  No constipation. Genitourinary: Negative for dysuria. Musculoskeletal: Negative for back pain.  Positive for neck pain. Skin: Negative for rash. Neurological: Positive for headaches, negative for focal weakness or numbness.  ____________________________________________   PHYSICAL EXAM:  VITAL SIGNS: ED Triage Vitals  Enc Vitals Group     BP 12/20/20 1306 (!) 118/97     Pulse Rate 12/20/20 1306 (!) 111     Resp 12/20/20 1306 19     Temp 12/20/20 1306 98.6 F (37 C)     Temp Source 12/20/20 1306 Oral     SpO2 12/20/20 1306 99 %     Weight 12/20/20 1305 207 lb 0.2 oz (93.9 kg)     Height 12/20/20 1305 5\' 9"  (1.753 m)     Head Circumference --      Peak Flow --      Pain Score 12/20/20 1305 7     Pain Loc --      Pain Edu? --      Excl. in [place]? --     Constitutional: Alert and oriented. Eyes: Conjunctivae are normal. Head: Atraumatic. Nose: No congestion/rhinnorhea. Mouth/Throat: Mucous membranes are moist. Neck: Normal ROM, midline cervical spine tenderness to palpation noted. Cardiovascular: Normal rate, regular rhythm. Grossly normal heart sounds. 2+ Radial pulses bilaterally. Respiratory: Normal respiratory effort.  No retractions. Lungs CTAB. Gastrointestinal: Soft and tender to palpation to bilateral lower quadrants with no rebound or guarding.  No distention. Genitourinary: deferred Musculoskeletal: No lower extremity tenderness nor edema. Neurologic:  Normal speech and language.  4 out of 5 strength in left lower extremity, 5 out of 5 strength in right lower extremity and bilateral upper extremities. Skin:  Skin is warm, dry and intact. No rash noted. Psychiatric: Mood and affect are normal. Speech and behavior are normal.  ____________________________________________   LABS (all labs ordered are listed, but only abnormal results  are displayed)  Labs Reviewed  COMPREHENSIVE METABOLIC PANEL - Abnormal; Notable for the following components:      Result Value   Albumin 3.3 (*)    All other components within normal limits  URINALYSIS, COMPLETE (UACMP) WITH MICROSCOPIC - Abnormal; Notable for the following components:   Color, Urine YELLOW (*)  APPearance CLEAR (*)    Specific Gravity, Urine >1.046 (*)    Glucose, UA 50 (*)    Hgb urine dipstick MODERATE (*)    Ketones, ur 5 (*)    Protein, ur 100 (*)    Bacteria, UA RARE (*)    All other components within normal limits  LIPASE, BLOOD  CBC   ____________________________________________  EKG  ED ECG REPORT I, Blake Divine, the attending physician, personally viewed and interpreted this ECG.   Date: 12/20/2020  EKG Time: 16:14  Rate: 84  Rhythm: normal sinus rhythm  Axis: LAD  Intervals:none  ST&T Change: None   PROCEDURES  Procedure(s) performed (including Critical Care):  Procedures  ------------------------------------------------------------------------------------------------------------------- Fecal Disimpaction Procedure Note:  Performed by me:  Patient placed in the lateral recumbent position with knees drawn towards chest. Nurse present for patient support. Large amount of hard brown stool removed. No complications during procedure.   ------------------------------------------------------------------------------------------------------------------  ____________________________________________   INITIAL IMPRESSION / ASSESSMENT AND PLAN / ED COURSE      75 year old male with past medical history of hypertension, hyperlipidemia, diabetes, hepatitis C, recent hemorrhagic stroke, schizophrenia, and polysubstance abuse who presents to the ED for worsening headache and neck pain after a fall earlier today, additionally complains of bilateral lower quadrant abdominal pain.  Headache and neck pain seem to be ongoing from 2 days ago, but  given trauma today with worsening pain, we will further assess with CT head and cervical spine.  No focal neurologic deficits noted on exam, left lower extremity weakness is chronic per patient.  We will also further assess with CT of his abdomen/pelvis, initial labs are unremarkable, LFTs and lipase within normal limits.  UA is pending.  We will treat his nausea and headache symptomatically with IV Compazine and Benadryl.  CT head and cervical spine are negative for acute process, CT abdomen/pelvis shows constipation with rectal stool ball but no other explanation for patient's abdominal pain.  Manual disimpaction was performed and patient reports feeling better.  Labs and UA are unremarkable, patient is appropriate for discharge home with PCP follow-up.  He was counseled to return to the ED for new worsening symptoms, patient agrees with plan.      ____________________________________________   FINAL CLINICAL IMPRESSION(S) / ED DIAGNOSES  Final diagnoses:  Generalized abdominal pain  Fecal impaction (HCC)  Acute nonintractable headache, unspecified headache type     ED Discharge Orders          Ordered    docusate sodium (COLACE) 100 MG capsule  2 times daily        12/20/20 2105             Note:  This document was prepared using Dragon voice recognition software and may include unintentional dictation errors.    Blake Divine, MD 12/20/20 2107

## 2020-12-20 NOTE — ED Notes (Signed)
Called EMS for transport back to home

## 2020-12-20 NOTE — Discharge Instructions (Addendum)

## 2020-12-20 NOTE — ED Notes (Signed)
Spoke with [name] ( Daughter ) at [phone] in regards to pt discharge status. Per [name], does not have a car to pick pt up. Transportation was provided from the last visit. Will proceed with discharge transportation at this time.

## 2020-12-20 NOTE — ED Notes (Signed)
Report received from Sarah, RN.

## 2020-12-20 NOTE — ED Notes (Signed)
Pt taken to CT.

## 2020-12-20 NOTE — ED Triage Notes (Signed)
Pt comes into the ED via [facility] from home c/o headache and abdominal.  Pt states he has been here a lot since his recent stroke a month ago.  Pt still c/o headache.  Pt states his abdominal pain is generalized with no nausea or vomiting.  PT in NAD at this time with even and unlabored respirations.

## 2020-12-20 NOTE — ED Notes (Signed)
Pt repositioned and bed alarm placed at this time via x2 RNs.

## 2021-02-04 ENCOUNTER — Observation Stay (HOSPITAL_COMMUNITY)
Admission: EM | Admit: 2021-02-04 | Discharge: 2021-02-09 | Disposition: A | Discharge status: Skilled Nursing Facility | Payer: Medicare HMO | Attending: Internal Medicine | Admitting: Internal Medicine

## 2021-02-04 DIAGNOSIS — F209 Schizophrenia, unspecified: Secondary | ICD-10-CM | POA: Diagnosis present

## 2021-02-04 DIAGNOSIS — Z20822 Contact with and (suspected) exposure to covid-19: Secondary | ICD-10-CM | POA: Diagnosis not present

## 2021-02-04 DIAGNOSIS — I5032 Chronic diastolic (congestive) heart failure: Secondary | ICD-10-CM | POA: Diagnosis not present

## 2021-02-04 DIAGNOSIS — N400 Enlarged prostate without lower urinary tract symptoms: Secondary | ICD-10-CM | POA: Diagnosis present

## 2021-02-04 DIAGNOSIS — F17210 Nicotine dependence, cigarettes, uncomplicated: Secondary | ICD-10-CM | POA: Diagnosis not present

## 2021-02-04 DIAGNOSIS — R29810 Facial weakness: Secondary | ICD-10-CM

## 2021-02-04 DIAGNOSIS — G510 Bell's palsy: Secondary | ICD-10-CM | POA: Diagnosis not present

## 2021-02-04 DIAGNOSIS — E119 Type 2 diabetes mellitus without complications: Secondary | ICD-10-CM | POA: Diagnosis not present

## 2021-02-04 DIAGNOSIS — E1165 Type 2 diabetes mellitus with hyperglycemia: Secondary | ICD-10-CM | POA: Diagnosis present

## 2021-02-04 DIAGNOSIS — B1920 Unspecified viral hepatitis C without hepatic coma: Secondary | ICD-10-CM | POA: Diagnosis present

## 2021-02-04 DIAGNOSIS — M6281 Muscle weakness (generalized): Secondary | ICD-10-CM | POA: Insufficient documentation

## 2021-02-04 DIAGNOSIS — R531 Weakness: Secondary | ICD-10-CM | POA: Diagnosis present

## 2021-02-04 DIAGNOSIS — Z794 Long term (current) use of insulin: Secondary | ICD-10-CM | POA: Diagnosis not present

## 2021-02-04 DIAGNOSIS — Z8673 Personal history of transient ischemic attack (TIA), and cerebral infarction without residual deficits: Secondary | ICD-10-CM | POA: Insufficient documentation

## 2021-02-04 DIAGNOSIS — I10 Essential (primary) hypertension: Secondary | ICD-10-CM | POA: Insufficient documentation

## 2021-02-04 DIAGNOSIS — I110 Hypertensive heart disease with heart failure: Secondary | ICD-10-CM | POA: Insufficient documentation

## 2021-02-04 DIAGNOSIS — Z79899 Other long term (current) drug therapy: Secondary | ICD-10-CM | POA: Diagnosis not present

## 2021-02-04 DIAGNOSIS — K219 Gastro-esophageal reflux disease without esophagitis: Secondary | ICD-10-CM | POA: Diagnosis present

## 2021-02-05 ENCOUNTER — Emergency Department (HOSPITAL_COMMUNITY): Payer: Medicare HMO

## 2021-02-05 ENCOUNTER — Other Ambulatory Visit: Payer: Self-pay

## 2021-02-05 ENCOUNTER — Observation Stay (HOSPITAL_COMMUNITY): Payer: Medicare HMO

## 2021-02-05 ENCOUNTER — Observation Stay (HOSPITAL_BASED_OUTPATIENT_CLINIC_OR_DEPARTMENT_OTHER): Payer: Medicare HMO

## 2021-02-05 ENCOUNTER — Encounter (HOSPITAL_COMMUNITY): Payer: Self-pay | Admitting: Internal Medicine

## 2021-02-05 DIAGNOSIS — N400 Enlarged prostate without lower urinary tract symptoms: Secondary | ICD-10-CM | POA: Diagnosis not present

## 2021-02-05 DIAGNOSIS — R531 Weakness: Secondary | ICD-10-CM | POA: Diagnosis not present

## 2021-02-05 DIAGNOSIS — I5032 Chronic diastolic (congestive) heart failure: Secondary | ICD-10-CM | POA: Diagnosis not present

## 2021-02-05 DIAGNOSIS — B1920 Unspecified viral hepatitis C without hepatic coma: Secondary | ICD-10-CM | POA: Diagnosis not present

## 2021-02-05 DIAGNOSIS — E1165 Type 2 diabetes mellitus with hyperglycemia: Secondary | ICD-10-CM

## 2021-02-05 DIAGNOSIS — K219 Gastro-esophageal reflux disease without esophagitis: Secondary | ICD-10-CM

## 2021-02-05 DIAGNOSIS — G510 Bell's palsy: Secondary | ICD-10-CM | POA: Diagnosis not present

## 2021-02-05 DIAGNOSIS — F209 Schizophrenia, unspecified: Secondary | ICD-10-CM

## 2021-02-05 LAB — RESP PANEL BY RT-PCR (FLU A&B, COVID) ARPGX2
Influenza A by PCR: NEGATIVE
Influenza B by PCR: NEGATIVE
SARS Coronavirus 2 by RT PCR: NEGATIVE

## 2021-02-05 LAB — COMPREHENSIVE METABOLIC PANEL
ALT: 13 U/L (ref 0–44)
AST: 11 U/L — ABNORMAL LOW (ref 15–41)
Albumin: 3 g/dL — ABNORMAL LOW (ref 3.5–5.0)
Alkaline Phosphatase: 95 U/L (ref 38–126)
Anion gap: 7 (ref 5–15)
BUN: 11 mg/dL (ref 8–23)
CO2: 26 mmol/L (ref 22–32)
Calcium: 8.6 mg/dL — ABNORMAL LOW (ref 8.9–10.3)
Chloride: 108 mmol/L (ref 98–111)
Creatinine, Ser: 1.12 mg/dL (ref 0.61–1.24)
GFR, Estimated: >60 mL/min (ref >60)
Glucose, Bld: 142 mg/dL — ABNORMAL HIGH (ref 70–99)
Potassium: 3.6 mmol/L (ref 3.5–5.1)
Sodium: 141 mmol/L (ref 135–145)
Total Bilirubin: 0.6 mg/dL (ref 0.3–1.2)
Total Protein: 6.5 g/dL (ref 6.5–8.1)

## 2021-02-05 LAB — I-STAT CHEM 8, ED
BUN: 12 mg/dL (ref 8–23)
Calcium, Ion: 1.12 mmol/L — ABNORMAL LOW (ref 1.15–1.40)
Chloride: 106 mmol/L (ref 98–111)
Creatinine, Ser: 1.1 mg/dL (ref 0.61–1.24)
Glucose, Bld: 137 mg/dL — ABNORMAL HIGH (ref 70–99)
HCT: 32 % — ABNORMAL LOW (ref 39.0–52.0)
Hemoglobin: 10.9 g/dL — ABNORMAL LOW (ref 13.0–17.0)
Potassium: 3.6 mmol/L (ref 3.5–5.1)
Sodium: 143 mmol/L (ref 135–145)
TCO2: 25 mmol/L (ref 22–32)

## 2021-02-05 LAB — CBC
HCT: 35.2 % — ABNORMAL LOW (ref 39.0–52.0)
Hemoglobin: 11.1 g/dL — ABNORMAL LOW (ref 13.0–17.0)
MCH: 30 pg (ref 26.0–34.0)
MCHC: 31.5 g/dL (ref 30.0–36.0)
MCV: 95.1 fL (ref 80.0–100.0)
Platelets: 206 10*3/uL (ref 150–400)
RBC: 3.7 MIL/uL — ABNORMAL LOW (ref 4.22–5.81)
RDW: 12.7 % (ref 11.5–15.5)
WBC: 9.9 10*3/uL (ref 4.0–10.5)
nRBC: 0 % (ref 0.0–0.2)

## 2021-02-05 LAB — DIFFERENTIAL
Abs Immature Granulocytes: 0.04 10*3/uL (ref 0.00–0.07)
Basophils Absolute: 0 10*3/uL (ref 0.0–0.1)
Basophils Relative: 0 %
Eosinophils Absolute: 0.1 10*3/uL (ref 0.0–0.5)
Eosinophils Relative: 1 %
Immature Granulocytes: 0 %
Lymphocytes Relative: 17 %
Lymphs Abs: 1.7 10*3/uL (ref 0.7–4.0)
Monocytes Absolute: 0.8 10*3/uL (ref 0.1–1.0)
Monocytes Relative: 8 %
Neutro Abs: 7.3 10*3/uL (ref 1.7–7.7)
Neutrophils Relative %: 74 %

## 2021-02-05 LAB — ECHOCARDIOGRAM COMPLETE BUBBLE STUDY
AR max vel: 4.31 cm2
AV Area VTI: 4.41 cm2
AV Area mean vel: 3.89 cm2
AV Mean grad: 4 mmHg
AV Peak grad: 7 mmHg
Ao pk vel: 1.32 m/s
Area-P 1/2: 2.89 cm2
S' Lateral: 2.5 cm
Single Plane A4C EF: 68.1 %

## 2021-02-05 LAB — CBG MONITORING, ED
Glucose-Capillary: 134 mg/dL — ABNORMAL HIGH (ref 70–99)
Glucose-Capillary: 210 mg/dL — ABNORMAL HIGH (ref 70–99)
Glucose-Capillary: 307 mg/dL — ABNORMAL HIGH (ref 70–99)
Glucose-Capillary: 95 mg/dL (ref 70–99)

## 2021-02-05 LAB — HEMOGLOBIN A1C
Hgb A1c MFr Bld: 8.6 % — ABNORMAL HIGH (ref 4.8–5.6)
Mean Plasma Glucose: 200.12 mg/dL

## 2021-02-05 LAB — GLUCOSE, CAPILLARY: Glucose-Capillary: 306 mg/dL — ABNORMAL HIGH (ref 70–99)

## 2021-02-05 LAB — APTT: aPTT: 26 seconds (ref 24–36)

## 2021-02-05 LAB — PROTIME-INR
INR: 1.2 (ref 0.8–1.2)
Prothrombin Time: 14.8 seconds (ref 11.4–15.2)

## 2021-02-05 MED ORDER — CITALOPRAM HYDROBROMIDE 10 MG PO TABS
20.0000 mg | ORAL_TABLET | Freq: Every day | ORAL | Status: DC
  Administered 2021-02-05 – 2021-02-09 (×5): 20 mg via ORAL
  Filled 2021-02-05 (×5): qty 2

## 2021-02-05 MED ORDER — ENOXAPARIN SODIUM 40 MG/0.4ML IJ SOSY
40.0000 mg | PREFILLED_SYRINGE | Freq: Every day | INTRAMUSCULAR | Status: DC
  Administered 2021-02-05 – 2021-02-09 (×5): 40 mg via SUBCUTANEOUS
  Filled 2021-02-05 (×5): qty 0.4

## 2021-02-05 MED ORDER — INSULIN DETEMIR 100 UNIT/ML ~~LOC~~ SOLN
23.0000 [IU] | Freq: Every day | SUBCUTANEOUS | Status: DC
  Administered 2021-02-05 – 2021-02-09 (×5): 23 [IU] via SUBCUTANEOUS
  Filled 2021-02-05 (×5): qty 0.23

## 2021-02-05 MED ORDER — TRAMADOL HCL 50 MG PO TABS
50.0000 mg | ORAL_TABLET | Freq: Two times a day (BID) | ORAL | Status: DC | PRN
  Administered 2021-02-06 – 2021-02-09 (×7): 50 mg via ORAL
  Filled 2021-02-05 (×7): qty 1

## 2021-02-05 MED ORDER — INSULIN ASPART 100 UNIT/ML IJ SOLN
10.0000 [IU] | Freq: Three times a day (TID) | INTRAMUSCULAR | Status: DC
  Administered 2021-02-05 – 2021-02-09 (×13): 10 [IU] via SUBCUTANEOUS

## 2021-02-05 MED ORDER — MELATONIN 3 MG PO TABS
3.0000 mg | ORAL_TABLET | Freq: Every day | ORAL | Status: DC
  Administered 2021-02-05 – 2021-02-09 (×5): 3 mg via ORAL
  Filled 2021-02-05 (×5): qty 1

## 2021-02-05 MED ORDER — IOHEXOL 350 MG/ML SOLN
75.0000 mL | Freq: Once | INTRAVENOUS | Status: AC | PRN
  Administered 2021-02-05: 75 mL via INTRAVENOUS

## 2021-02-05 MED ORDER — POLYETHYLENE GLYCOL 3350 17 G PO PACK: 17.0000 g | PACK | Freq: Every day | ORAL | Status: DC | PRN

## 2021-02-05 MED ORDER — GABAPENTIN 100 MG PO CAPS
100.0000 mg | ORAL_CAPSULE | Freq: Three times a day (TID) | ORAL | Status: DC
  Administered 2021-02-05 – 2021-02-09 (×15): 100 mg via ORAL
  Filled 2021-02-05 (×15): qty 1

## 2021-02-05 MED ORDER — ONDANSETRON HCL 4 MG/2ML IJ SOLN: 4.0000 mg | Freq: Four times a day (QID) | INTRAMUSCULAR | Status: DC | PRN

## 2021-02-05 MED ORDER — STROKE: EARLY STAGES OF RECOVERY BOOK
Freq: Once | Status: AC
  Filled 2021-02-05: qty 1

## 2021-02-05 MED ORDER — ACETAMINOPHEN 160 MG/5ML PO SOLN
650.0000 mg | ORAL | Status: DC | PRN
  Administered 2021-02-06 – 2021-02-09 (×2): 650 mg
  Filled 2021-02-05 (×2): qty 20.3

## 2021-02-05 MED ORDER — VITAMIN B-12 1000 MCG PO TABS
1000.0000 ug | ORAL_TABLET | Freq: Every day | ORAL | Status: DC
  Administered 2021-02-05 – 2021-02-09 (×5): 1000 ug via ORAL
  Filled 2021-02-05 (×5): qty 1

## 2021-02-05 MED ORDER — BUSPIRONE HCL 10 MG PO TABS
5.0000 mg | ORAL_TABLET | Freq: Two times a day (BID) | ORAL | Status: DC
  Administered 2021-02-05 – 2021-02-09 (×10): 5 mg via ORAL
  Filled 2021-02-05 (×10): qty 1

## 2021-02-05 MED ORDER — OLANZAPINE 2.5 MG PO TABS
5.0000 mg | ORAL_TABLET | Freq: Every day | ORAL | Status: DC
  Administered 2021-02-05 – 2021-02-09 (×5): 5 mg via ORAL
  Filled 2021-02-05: qty 1
  Filled 2021-02-05 (×5): qty 2

## 2021-02-05 MED ORDER — PREDNISONE 20 MG PO TABS
60.0000 mg | ORAL_TABLET | Freq: Every day | ORAL | Status: DC
  Administered 2021-02-05 – 2021-02-09 (×5): 60 mg via ORAL
  Filled 2021-02-05 (×5): qty 3

## 2021-02-05 MED ORDER — AMLODIPINE BESYLATE 10 MG PO TABS
10.0000 mg | ORAL_TABLET | Freq: Every day | ORAL | Status: DC
  Administered 2021-02-05 – 2021-02-09 (×5): 10 mg via ORAL
  Filled 2021-02-05: qty 1
  Filled 2021-02-05: qty 2
  Filled 2021-02-05 (×3): qty 1

## 2021-02-05 MED ORDER — TAMSULOSIN HCL 0.4 MG PO CAPS
0.4000 mg | ORAL_CAPSULE | Freq: Every day | ORAL | Status: DC
  Administered 2021-02-05 – 2021-02-09 (×5): 0.4 mg via ORAL
  Filled 2021-02-05 (×5): qty 1

## 2021-02-05 MED ORDER — HYDRALAZINE HCL 20 MG/ML IJ SOLN: 10.0000 mg | Freq: Four times a day (QID) | INTRAMUSCULAR | Status: DC | PRN

## 2021-02-05 MED ORDER — ALBUTEROL SULFATE (2.5 MG/3ML) 0.083% IN NEBU: 2.5000 mg | INHALATION_SOLUTION | Freq: Four times a day (QID) | RESPIRATORY_TRACT | Status: DC | PRN

## 2021-02-05 MED ORDER — GADOBUTROL 1 MMOL/ML IV SOLN
8.0000 mL | Freq: Once | INTRAVENOUS | Status: AC | PRN
  Administered 2021-02-05: 8 mL via INTRAVENOUS

## 2021-02-05 MED ORDER — ASPIRIN 325 MG PO TABS
325.0000 mg | ORAL_TABLET | Freq: Every day | ORAL | Status: DC
  Administered 2021-02-05: 325 mg via ORAL
  Filled 2021-02-05: qty 1

## 2021-02-05 MED ORDER — POLYVINYL ALCOHOL 1.4 % OP SOLN: 1.0000 [drp] | OPHTHALMIC | Status: DC | PRN

## 2021-02-05 MED ORDER — PANTOPRAZOLE SODIUM 40 MG PO TBEC
40.0000 mg | DELAYED_RELEASE_TABLET | Freq: Every day | ORAL | Status: DC
  Administered 2021-02-05 – 2021-02-09 (×5): 40 mg via ORAL
  Filled 2021-02-05 (×5): qty 1

## 2021-02-05 MED ORDER — FLUORESCEIN SODIUM 1 MG OP STRP
1.0000 | ORAL_STRIP | Freq: Once | OPHTHALMIC | Status: AC
  Administered 2021-02-05: 1 via OPHTHALMIC
  Filled 2021-02-05: qty 1

## 2021-02-05 MED ORDER — ACETAMINOPHEN 325 MG PO TABS
650.0000 mg | ORAL_TABLET | ORAL | Status: DC | PRN
  Administered 2021-02-06 – 2021-02-08 (×2): 650 mg via ORAL
  Filled 2021-02-05 (×2): qty 2

## 2021-02-05 MED ORDER — ARTIFICIAL TEARS OPHTHALMIC OINT
1.0000 "application " | TOPICAL_OINTMENT | OPHTHALMIC | Status: DC | PRN
  Filled 2021-02-05: qty 3.5

## 2021-02-05 MED ORDER — INSULIN ASPART 100 UNIT/ML IJ SOLN
0.0000 [IU] | Freq: Three times a day (TID) | INTRAMUSCULAR | Status: DC
  Administered 2021-02-05: 5 [IU] via SUBCUTANEOUS
  Administered 2021-02-05 (×2): 11 [IU] via SUBCUTANEOUS
  Administered 2021-02-06: 15 [IU] via SUBCUTANEOUS
  Administered 2021-02-06: 2 [IU] via SUBCUTANEOUS
  Administered 2021-02-06: 3 [IU] via SUBCUTANEOUS
  Administered 2021-02-07 (×2): 15 [IU] via SUBCUTANEOUS
  Administered 2021-02-07: 3 [IU] via SUBCUTANEOUS
  Administered 2021-02-08: 8 [IU] via SUBCUTANEOUS
  Administered 2021-02-08: 11 [IU] via SUBCUTANEOUS
  Administered 2021-02-08: 8 [IU] via SUBCUTANEOUS
  Administered 2021-02-08 – 2021-02-09 (×2): 3 [IU] via SUBCUTANEOUS
  Administered 2021-02-09: 11 [IU] via SUBCUTANEOUS
  Administered 2021-02-09: 8 [IU] via SUBCUTANEOUS
  Administered 2021-02-09: 5 [IU] via SUBCUTANEOUS

## 2021-02-05 MED ORDER — DOCUSATE SODIUM 100 MG PO CAPS
100.0000 mg | ORAL_CAPSULE | Freq: Two times a day (BID) | ORAL | Status: DC
  Administered 2021-02-05 – 2021-02-09 (×10): 100 mg via ORAL
  Filled 2021-02-05 (×10): qty 1

## 2021-02-05 MED ORDER — VALACYCLOVIR HCL 500 MG PO TABS
1000.0000 mg | ORAL_TABLET | Freq: Three times a day (TID) | ORAL | Status: DC
  Administered 2021-02-05 – 2021-02-09 (×15): 1000 mg via ORAL
  Filled 2021-02-05 (×17): qty 2

## 2021-02-05 MED ORDER — SODIUM CHLORIDE 0.9% FLUSH
3.0000 mL | Freq: Once | INTRAVENOUS | Status: AC
  Administered 2021-02-05: 3 mL via INTRAVENOUS

## 2021-02-05 MED ORDER — ACETAMINOPHEN 160 MG/5ML PO SOLN
650.0000 mg | Freq: Once | ORAL | Status: AC
  Administered 2021-02-05: 650 mg via ORAL
  Filled 2021-02-05: qty 20.3

## 2021-02-05 MED ORDER — ACETAMINOPHEN 650 MG RE SUPP: 650.0000 mg | RECTAL | Status: DC | PRN

## 2021-02-05 MED ORDER — SENNOSIDES-DOCUSATE SODIUM 8.6-50 MG PO TABS
1.0000 | ORAL_TABLET | Freq: Two times a day (BID) | ORAL | Status: DC
  Administered 2021-02-05 – 2021-02-09 (×10): 1 via ORAL
  Filled 2021-02-05 (×11): qty 1

## 2021-02-05 MED ORDER — ATORVASTATIN CALCIUM 10 MG PO TABS
20.0000 mg | ORAL_TABLET | Freq: Every day | ORAL | Status: DC
  Administered 2021-02-05 – 2021-02-09 (×5): 20 mg via ORAL
  Filled 2021-02-05 (×5): qty 2

## 2021-02-05 NOTE — ED Notes (Signed)
Pt taken to MRI  

## 2021-02-05 NOTE — Consult Note (Signed)
Referring Physician: Dr. Leonette Monarch    Chief Complaint: New onset of right sided weakness  HPI: Lance [name] is a 75 y.o. male with a PMHx of stroke, DM, hepatitis C and schizophrenia, who presents via EMS for assessment of new onset right sided weakness. The patient was "last seen normal on Saturday morning", with uncertain exact time. Later in the day he was noted to "have a right sided facial droop". Sunday morning, he was noted to have "right sided weakness" in addition to the facial droop. Tonight, patient's family decided to call EMS due to his persistent worsening.   On further interview with patient, he states that he has had right sided facial weakness since last week. Also endorses difficulty fully closing his right eye over the same period. When asked, he states that his sense of taste has also been impaired. Denies any right ear pain, vesicles or drainage. Does not complain of hyperacusis. He denies any right arm or leg weakness. States that his left side has been chronically weak since the stroke in March.   LSN: Saturday morning tPA Given: No: Out of the time window.  Thrombectomy candidate: No, out of the time window.   Past Medical History:  Diagnosis Date   CVA (cerebral vascular accident) ([place])    Diabetes mellitus without complication ([place])    Hepatitis C    Schizophrenia ([place])     Past Surgical History:  Procedure Laterality Date   COLONOSCOPY WITH PROPOFOL N/A 08/15/2020   Procedure: COLONOSCOPY WITH PROPOFOL;  Surgeon: Lucilla Lame, MD;  Location: [facility] ENDOSCOPY;  Service: Endoscopy;  Laterality: N/A;   ESOPHAGOGASTRODUODENOSCOPY (EGD) WITH PROPOFOL N/A 10/10/2016   Procedure: ESOPHAGOGASTRODUODENOSCOPY (EGD) WITH PROPOFOL;  Surgeon: Lucilla Lame, MD;  Location: [facility] ENDOSCOPY;  Service: Endoscopy;  Laterality: N/A;   HERNIA REPAIR     VISCERAL ARTERY INTERVENTION N/A 10/11/2016   Procedure: Visceral Artery Intervention;  Surgeon: Algernon Huxley, MD;  Location: [place]  [facility];  Service: Cardiovascular;  Laterality: N/A;    Family History  Problem Relation Age of Onset   Diabetes Sister    Social History:  reports that he has been smoking cigarettes. He has a 2.50 pack-year smoking history. He has never used smokeless tobacco. He reports that he does not currently use alcohol after a past usage of about 10.0 standard drinks per week. He reports that he does not use drugs.  Allergies: No Known Allergies  Medications: No current facility-administered medications on file prior to encounter.   Current Outpatient Medications on File Prior to Encounter  Medication Sig Dispense Refill   acetaminophen (TYLENOL) 325 MG tablet Take 2 tablets (650 mg total) by mouth every 4 (four) hours as needed for mild pain (or temp > 37.5 C (99.5 F)).     acetaminophen (TYLENOL) 650 MG CR tablet Take 650 mg by mouth every 8 (eight) hours as needed for pain.     albuterol (VENTOLIN HFA) 108 (90 Base) MCG/ACT inhaler Inhale 2 puffs into the lungs every 6 (six) hours as needed for wheezing or shortness of breath. 18 g 0   amLODipine (NORVASC) 10 MG tablet Take 1 tablet (10 mg total) by mouth daily. 30 tablet 0   atorvastatin (LIPITOR) 20 MG tablet Take 1 tablet (20 mg total) by mouth daily. 30 tablet 0   cyanocobalamin 1000 MCG tablet Take 1 tablet (1,000 mcg total) by mouth daily. 30 tablet 0   Diapers & Supplies MISC 1 Units by Does not apply route  every 2 (two) hours as needed. 100 Units 0   diclofenac Sodium (VOLTAREN) 1 % GEL Apply 2 g topically 4 (four) times daily. 2 g 0   docusate sodium (COLACE) 100 MG capsule Take 1 capsule (100 mg total) by mouth 2 (two) times daily. 60 capsule 2   ferrous sulfate 325 (65 FE) MG tablet Take 1 tablet (325 mg total) by mouth daily with breakfast. 30 tablet 0   gabapentin (NEURONTIN) 100 MG capsule Take 1 capsule (100 mg total) by mouth 3 (three) times daily. 90 capsule 0   insulin aspart (NOVOLOG FLEXPEN) 100 UNIT/ML FlexPen Inject 10  Units into the skin 3 (three) times daily with meals. 15 mL 11   insulin detemir (LEVEMIR FLEXTOUCH) 100 UNIT/ML FlexPen Inject 23 Units into the skin at bedtime. 15 mL 11   lidocaine (LIDODERM) 5 % Place 1 patch onto the skin daily. Remove & Discard patch within 12 hours or as directed by MD 30 patch 0   melatonin 3 MG TABS tablet Take 1 tablet (3 mg total) by mouth at bedtime. 30 tablet 0   Multiple Vitamin (MULTI-VITAMIN) tablet Take 1 tablet by mouth daily.     OLANZapine (ZYPREXA) 5 MG tablet Take 1 tablet (5 mg total) by mouth at bedtime. 30 tablet 0   pantoprazole (PROTONIX) 40 MG tablet Take 1 tablet (40 mg total) by mouth at bedtime. 30 tablet 0   polyethylene glycol (MIRALAX / GLYCOLAX) 17 g packet Take 17 g by mouth daily as needed for mild constipation. 30 each 0   polyvinyl alcohol (ARTIFICIAL TEARS) 1.4 % ophthalmic solution Place 1 drop into both eyes as needed for dry eyes. 15 mL 0   senna-docusate (SENOKOT-S) 8.6-50 MG tablet Take 1 tablet by mouth 2 (two) times daily.     tamsulosin (FLOMAX) 0.4 MG CAPS capsule Take 1 capsule (0.4 mg total) by mouth daily after supper. 30 capsule 0   tiZANidine (ZANAFLEX) 2 MG tablet Take 1 tablet (2 mg total) by mouth 4 (four) times daily -  with meals and at bedtime. 90 tablet 0   traMADol (ULTRAM) 50 MG tablet Take 1 tablet (50 mg total) by mouth every 12 (twelve) hours as needed for moderate pain. 30 tablet 0     ROS: As per HPI.   Physical Examination: There were no vitals taken for this visit.  HEENT: [place]/AT. Right scleral injection is noted.  Lungs: Respirations unlabored Ext: No edema  Neurologic Examination: Mental Status: Awake and alert. Speech is mildly dysarthric but fluent in the context of his right facial droop. Naming is intact. Comprehension intact. Affect is subdued.  Cranial Nerves: II:  Visual fields intact bilaterally. No visual extinction to DSS. PERRL.  III,IV, VI: EOMI without nystagmus.  V: Temp sensation equal  bilaterally.  VII: Unable to fully close right eye. Severe weakness of right side of face including perioral region. Normal strength to left side of face.  VIII: Hearing intact to conversation.  IX,X: No hoarseness.  XI: Head is midline. Shoulder shrug equal bilaterally  XII: Midline tongue extension  Motor: RUE 5/5 proximally and distally.  LUE 4+/5 proximally and distally Subtle pronator drift on the left when testing [place].  RLE 5/5 proximally and distally LLE 4/5 HF, KE, KF, and APF. 4-/5 ADF.  Sensory: Temp and FT sensation equal bilaterally. No extinction to DSS.  Deep Tendon Reflexes:  2+ bilateral biceps and brachioradialis Right patella 2+ Left patella 0 Right toe downgoing, left toe equivocal.  Cerebellar: No ataxia with FNF bilaterally  Gait: Deferred   Results for orders placed or performed during the hospital encounter of 02/04/21 (from the past 48 hour(s))  CBG monitoring, ED     Status: Abnormal   Collection Time: 02/05/21 12:01 AM  Result Value Ref Range   Glucose-Capillary 134 (H) 70 - 99 mg/dL    Comment: Glucose reference range applies only to samples taken after fasting for at least 8 hours.   Comment 1 Notify RN    Comment 2 Document in Chart    No results found.  Assessment: 74 y.o. male with a PMHx of stroke, DM, hepatitis C and schizophrenia, who presents via EMS for assessment of new onset right sided weakness. On interview with patient, he states that he has had right sided facial weakness since last week. Also endorses difficulty fully closing his right eye over the same period. When asked, he states that his sense of taste has also been impaired. He denies any right arm or leg weakness. States that his left side has been chronically weak since the stroke in March.  1. Exam reveals findings most consistent with a right-sided Bell's palsy. No right ear drainage or swelling noted.  2. CT head: No acute intracranial abnormality. ASPECTS is 10. Unchanged  appearance of right convexity meningioma. 3. CTA of head and neck: No emergent large vessel occlusion or hemodynamically significant stenosis. 4. Stroke Risk Factors - Prior stroke and DM  Recommendations: 1. MRI of brain with and without contrast to assess for right pontine stroke versus enhancement of right facial nerve versus compressive lesion adjacent to right facial nerve.  2. PT consult, OT consult, Speech consult 3. If MRI brain is negative for stroke, start treatment for Bell's palsy with prednisone and valacyclovir per standard protocol (including steroid taper).  4. If MRI brain reveals acute stroke, complete stroke work up with TTE.  5. Cardiac telemetry 6. Continue atorvastatin. 8. Start ASA 325 mg po qd. Appears likely to be able to swallow without aspiration, but will need bedside swallow evaluation prior to PO meds.  9. IVF 10. Permissive HTN for now.  11. Frequent neuro checks   '@Electronically'$  signed: Dr. Kerney Elbe  02/05/2021, 12:09 AM

## 2021-02-05 NOTE — Progress Notes (Signed)
Occupational Therapy Evaluation Patient Details Name: Lance [name] MRN: [id number] DOB: 03/23/1946 Today's Date: 02/05/2021    History of Present Illness 75 yo admitted with R facial droop. MRI (-) for CVA and suggestive of Bell's palsy . PMH: DM; CVA, Hep C, schizophrenia, CHF, cocaine abuse. Recently discharged from [facility] 11/16/2020 s/p R frontal parietal parenchymal bleed. Discharged home at that time with HHOT/PT with recommendations for [facility] lift, hospital bed and wc. At that time, pt required Assist with ADL and total A +2 with stand pivot transfers.   Clinical Impression   Spoke with daughter [name]) over the phone who states she is unable to provide the level of care needed to care for her father at home and is in hopes that he can DC to a SNF for continued therapy. States [facility] [place] services has been minimal and that her father is "worse now than when he left rehab". Feel pt has potential to continue to make progress to being more independent with ADL and mobility. Recommend rehab at SNF. Pt overall max A +2 with mobility and Max A with LB ADL. Will follow acutely.     Follow Up Recommendations  SNF    Equipment Recommendations  None recommended by OT    Recommendations for Other Services Other (comment) (SW consult)     Precautions / Restrictions Precautions Precautions: Fall      Mobility Bed Mobility Overal bed mobility: Needs Assistance Bed Mobility: Supine to Sit;Sit to Supine     Supine to sit: Max assist;+2 for physical assistance Sit to supine: Max assist;+2 for physical assistance        Transfers Overall transfer level: Needs assistance   Transfers: Sit to/from Stand Sit to Stand: Max assist;+2 physical assistance         General transfer comment: posterior and L bias    Balance Overall balance assessment: Needs assistance   Sitting balance-Leahy Scale: Poor       Standing balance-Leahy Scale: Zero                             ADL  either performed or assessed with clinical judgement   ADL Overall ADL's : Needs assistance/impaired Eating/Feeding: Set up   Grooming: Set up;Supervision/safety;Sitting   Upper Body Bathing: Minimal assistance;Sitting   Lower Body Bathing: Maximal assistance;Sit to/from stand   Upper Body Dressing : Moderate assistance;Sitting   Lower Body Dressing: Maximal assistance;Sit to/from stand   Toilet Transfer: +2 for physical assistance;Maximal assistance   Toileting- Clothing Manipulation and Hygiene: Total assistance Toileting - Clothing Manipulation Details (indicate cue type and reason): incontinenet     Functional mobility during ADLs: Maximal assistance;+2 for physical assistance       Vision Baseline Vision/History: 1 Wears glasses Additional Comments: decreased visual atteniton. Most likely baseline     Perception Perception Comments: skewed midline postural control to L   Praxis      Pertinent Vitals/Pain Pain Assessment: Faces Faces Pain Scale: Hurts even more Pain Location: neck Pain Descriptors / Indicators: Aching;Grimacing;Discomfort Pain Intervention(s): Limited activity within patient's tolerance;Patient requesting pain meds-RN notified;Repositioned     Hand Dominance Right   Extremity/Trunk Assessment Upper Extremity Assessment Upper Extremity Assessment: LUE deficits/detail LUE Deficits / Details: usin gfunctionally with coordiantion deficits noted   Lower Extremity Assessment Lower Extremity Assessment: Defer to PT evaluation   Cervical / Trunk Assessment Cervical / Trunk Assessment: Other exceptions (L bias)   Communication Communication Communication: No difficulties  Cognition Arousal/Alertness: Awake/alert Behavior During Therapy: Impulsive;Restless Overall Cognitive Status: No family/caregiver present to determine baseline cognitive functioning Area of Impairment: Orientation;Attention;Memory;Following  commands;Safety/judgement;Awareness;Problem solving                 Orientation Level: Disoriented to;Time Current Attention Level: Sustained Memory: Decreased short-term memory Following Commands: Follows one step commands inconsistently Safety/Judgement: Decreased awareness of safety;Decreased awareness of deficits Awareness: Intellectual Problem Solving: Slow processing;Difficulty sequencing General Comments: most likely at baseline s/p CVA. Daughter staes that "he just doesn't want to do anything"   General Comments       Exercises     Shoulder Instructions      Home Living Family/patient expects to be discharged to:: Private residence Living Arrangements: Children Available Help at Discharge: Family;Available 24 hours/day;Other (Comment);Personal care attendant Type of Home: House Home Access: Stairs to enter [employer] of Steps: 4 Entrance Stairs-Rails: Right;Left;Can reach both Home Layout: One level     Bathroom Shower/Tub: Teacher, early years/pre: Standard Bathroom Accessibility: Yes   Home Equipment: Environmental consultant - 4 wheels;Cane - single point;Wheelchair - manual;Hospital bed (hoyer)          Prior Functioning/Environment Level of Independence: Needs assistance        Comments: daughter has been assisting with all ADL and has been walking pt to the bathroom with "Max A" and use of a fold up chair. Pt's equipment is at his house adn daughter dow not have equipment in her home. Was active with HHPT however daughter states they only "got him out of the bed 1 time"        OT Problem List: Decreased strength;Decreased activity tolerance;Impaired balance (sitting and/or standing);Decreased coordination;Decreased cognition;Decreased safety awareness;Decreased knowledge of use of DME or AE;Impaired sensation;Impaired UE functional use;Pain      OT Treatment/Interventions: Self-care/ADL training;Therapeutic exercise;Neuromuscular  education;DME and/or AE instruction;Therapeutic activities;Cognitive remediation/compensation;Visual/perceptual remediation/compensation;Patient/family education;Balance training    OT Goals(Current goals can be found in the care plan section) Acute Rehab OT Goals Patient Stated Goal: to get some more rehab OT Goal Formulation: With patient/family Time For Goal Achievement: 02/19/21 Potential to Achieve Goals: Good  OT Frequency: Min 2X/week   Barriers to D/C:            Co-evaluation              AM-PAC OT "6 Clicks" Daily Activity     Outcome Measure Help from another person eating meals?: A Little Help from another person taking care of personal grooming?: A Little Help from another person toileting, which includes using toliet, bedpan, or urinal?: Total Help from another person bathing (including washing, rinsing, drying)?: A Lot Help from another person to put on and taking off regular upper body clothing?: A Lot Help from another person to put on and taking off regular lower body clothing?: A Lot 6 Click Score: 13   End of Session Equipment Utilized During Treatment: Gait belt Nurse Communication: Mobility status (DC needs)  Activity Tolerance: Patient tolerated treatment well Patient left: in bed;Other (comment) (stretcher)  OT Visit Diagnosis: Unsteadiness on feet (R26.81);Other abnormalities of gait and mobility (R26.89);Muscle weakness (generalized) (M62.81);Other symptoms and signs involving cognitive function;Pain Pain - part of body:  (neck)                Time: 1001-1031 OT Time Calculation (min): 30 min Charges:  OT General Charges $OT Visit: 1 Visit OT Evaluation $OT Eval Moderate Complexity: [place], OT/L   Acute  OT Clinical Specialist Acute Rehabilitation Services Pager [phone] Office [phone]   [facility] 02/05/2021, 11:21 AM

## 2021-02-05 NOTE — Progress Notes (Signed)
CSW called Cassandra again and also the number listed for Pt as Pt states that sometimes daughter [name] carries Pt's phone.  As of this writing CSW unable to make contact with family to discuss possible barriers to SNF placement and to ascertain whether Pt has had Covid vaccines.  CSW did start auth in [facility] portal and sent referrals to several area SNFs.

## 2021-02-05 NOTE — ED Provider Notes (Addendum)
[facility] EMERGENCY DEPARTMENT Provider Note  CSN: [id number] Arrival date & time: 02/04/21 2358  Chief Complaint(s) Weakness  HPI Lance [name] is a 75 y.o. male with a past medical history listed below who presents to the emergency department as a code stroke called by EMS.  They were called out for new onset right-sided weakness.  Last known normal was Saturday morning (more than 24 hours ago), but unsure of the exact time.  Apparently Saturday morning patient had right-sided facial droop.  This morning they noted right-sided weakness in addition to the right-sided facial droop.  Family decided to call due to persistent symptoms.   At baseline patient has left-sided deficits from prior CVA.  Patient denies any other physical complaints currently.  HPI  Past Medical History Past Medical History:  Diagnosis Date   CVA (cerebral vascular accident) ([place])    Diabetes mellitus without complication ([place])    Hepatitis C    Schizophrenia ([place])    Patient Active Problem List   Diagnosis Date Noted   Controlled type 2 diabetes mellitus with hyperglycemia ([place])    Essential hypertension    Vascular headache    Spastic hemiparesis ([place])    Intraparenchymal hemorrhage of brain ([place]) 10/19/2020   Left hemiplegia ([place]) 10/19/2020   ICH (intracerebral hemorrhage) ([place]) 10/14/2020   Weakness    Acute metabolic encephalopathy    Silent micro-hemorrhage of brain ([place])    Functional assessment declined    Malnutrition of moderate degree 05/26/2020   Subacute delirium 05/16/2020   Syncope 05/15/2020   Sepsis ([place]) 05/14/2020   Leg pain 12/07/2018   PAD (peripheral artery disease) ([place]) 12/07/2018   Acute upper GI bleed 10/10/2016   Hypotension    Polysubstance abuse ([place]) 03/10/2015   Mass of arm 03/10/2015   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 03/10/2015   Perianal abscess 08/31/2014   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 ([place]) 07/07/2014   Adenomatous polyp of colon 08/30/2013    Type 2 diabetes mellitus with hyperglycemia, without long-term current use of insulin ([place]) 08/26/2013   Hepatitis C virus infection without hepatic coma 03/21/2013   Need for hepatitis B vaccination 03/21/2013   Depression 10/23/2012   IVDU (intravenous drug user) 10/23/2012   Schizophrenia ([place]) 10/23/2012   Carpal tunnel syndrome on both sides 07/31/2012   Diabetic peripheral neuropathy ([place]) 07/31/2012   Osteoarthritis of both hips 07/31/2012   Esophageal reflux 10/21/2009   Diverticulitis of colon 10/21/2009   Home Medication(s) Prior to Admission medications   Medication Sig Start Date End Date Taking? Authorizing Provider  acetaminophen (TYLENOL) 325 MG tablet Take 2 tablets (650 mg total) by mouth every 4 (four) hours as needed for mild pain (or temp > 37.5 C (99.5 F)). 11/16/20   Angiulli, Lavon Paganini, PA-C  acetaminophen (TYLENOL) 650 MG CR tablet Take 650 mg by mouth every 8 (eight) hours as needed for pain.    [provider]  albuterol (VENTOLIN HFA) 108 (90 Base) MCG/ACT inhaler Inhale 2 puffs into the lungs every 6 (six) hours as needed for wheezing or shortness of breath. 11/16/20   Angiulli, Lavon Paganini, PA-C  amLODipine (NORVASC) 10 MG tablet Take 1 tablet (10 mg total) by mouth daily. 11/16/20   Angiulli, Lavon Paganini, PA-C  atorvastatin (LIPITOR) 20 MG tablet Take 1 tablet (20 mg total) by mouth daily. 11/16/20   Angiulli, Lavon Paganini, PA-C  cyanocobalamin 1000 MCG tablet Take 1 tablet (1,000 mcg total) by mouth daily. 11/16/20   [place], Lavon Paganini,  PA-C  Diapers & Supplies MISC 1 Units by Does not apply route every 2 (two) hours as needed. 12/18/20 01/17/21  Naaman Plummer, MD  diclofenac Sodium (VOLTAREN) 1 % GEL Apply 2 g topically 4 (four) times daily. 11/16/20   Angiulli, Lavon Paganini, PA-C  docusate sodium (COLACE) 100 MG capsule Take 1 capsule (100 mg total) by mouth 2 (two) times daily. 12/20/20 12/20/21  Blake Divine, MD  ferrous sulfate 325 (65 FE) MG tablet Take 1 tablet (325 mg  total) by mouth daily with breakfast. 11/16/20   Angiulli, Lavon Paganini, PA-C  gabapentin (NEURONTIN) 100 MG capsule Take 1 capsule (100 mg total) by mouth 3 (three) times daily. 11/16/20   Angiulli, Lavon Paganini, PA-C  insulin aspart (NOVOLOG FLEXPEN) 100 UNIT/ML FlexPen Inject 10 Units into the skin 3 (three) times daily with meals. 11/16/20   Angiulli, Lavon Paganini, PA-C  insulin detemir (LEVEMIR FLEXTOUCH) 100 UNIT/ML FlexPen Inject 23 Units into the skin at bedtime. 11/16/20   Angiulli, Lavon Paganini, PA-C  lidocaine (LIDODERM) 5 % Place 1 patch onto the skin daily. Remove & Discard patch within 12 hours or as directed by MD 11/16/20   Angiulli, Lavon Paganini, PA-C  melatonin 3 MG TABS tablet Take 1 tablet (3 mg total) by mouth at bedtime. 11/16/20   Angiulli, Lavon Paganini, PA-C  Multiple Vitamin (MULTI-VITAMIN) tablet Take 1 tablet by mouth daily.    [provider]  OLANZapine (ZYPREXA) 5 MG tablet Take 1 tablet (5 mg total) by mouth at bedtime. 11/16/20   Angiulli, Lavon Paganini, PA-C  pantoprazole (PROTONIX) 40 MG tablet Take 1 tablet (40 mg total) by mouth at bedtime. 11/16/20   Angiulli, Lavon Paganini, PA-C  polyethylene glycol (MIRALAX / GLYCOLAX) 17 g packet Take 17 g by mouth daily as needed for mild constipation. 06/15/20   Loletha Grayer, MD  polyvinyl alcohol (ARTIFICIAL TEARS) 1.4 % ophthalmic solution Place 1 drop into both eyes as needed for dry eyes. 06/15/20   Loletha Grayer, MD  senna-docusate (SENOKOT-S) 8.6-50 MG tablet Take 1 tablet by mouth 2 (two) times daily. 11/16/20   Angiulli, Lavon Paganini, PA-C  tamsulosin (FLOMAX) 0.4 MG CAPS capsule Take 1 capsule (0.4 mg total) by mouth daily after supper. 11/16/20   Angiulli, Lavon Paganini, PA-C  tiZANidine (ZANAFLEX) 2 MG tablet Take 1 tablet (2 mg total) by mouth 4 (four) times daily -  with meals and at bedtime. 11/16/20   Angiulli, Lavon Paganini, PA-C  traMADol (ULTRAM) 50 MG tablet Take 1 tablet (50 mg total) by mouth every 12 (twelve) hours as needed for moderate pain. 11/16/20   Angiulli,  Lavon Paganini, PA-C                                                                                                                                    Past Surgical History Past Surgical History:  Procedure Laterality Date   COLONOSCOPY WITH PROPOFOL  N/A 08/15/2020   Procedure: COLONOSCOPY WITH PROPOFOL;  Surgeon: Lucilla Lame, MD;  Location: [facility] ENDOSCOPY;  Service: Endoscopy;  Laterality: N/A;   ESOPHAGOGASTRODUODENOSCOPY (EGD) WITH PROPOFOL N/A 10/10/2016   Procedure: ESOPHAGOGASTRODUODENOSCOPY (EGD) WITH PROPOFOL;  Surgeon: Lucilla Lame, MD;  Location: [facility] ENDOSCOPY;  Service: Endoscopy;  Laterality: N/A;   HERNIA REPAIR     VISCERAL ARTERY INTERVENTION N/A 10/11/2016   Procedure: Visceral Artery Intervention;  Surgeon: Algernon Huxley, MD;  Location: [facility] CV LAB;  Service: Cardiovascular;  Laterality: N/A;   Family History Family History  Problem Relation Age of Onset   Diabetes Sister     Social History Social History   Tobacco Use   Smoking status: Every Day    Packs/day: 0.25    Years: 10.00    Pack years: 2.50    Types: Cigarettes   Smokeless tobacco: Never  Vaping Use   Vaping Use: Never used  Substance Use Topics   Alcohol use: Not Currently    Alcohol/week: 10.0 standard drinks    Types: 10 Shots of liquor per week   Drug use: No   Allergies Patient has no known allergies.  Review of Systems Review of Systems All other systems are reviewed and are negative for acute change except as noted in the HPI  Physical Exam Vital Signs  I have reviewed the triage vital signs BP 137/72   Pulse 78   Resp 16   SpO2 99%   Physical Exam Vitals reviewed.  Constitutional:      General: He is not in acute distress.    Appearance: He is well-developed. He is not diaphoretic.  HENT:     Head: Normocephalic and atraumatic.     Nose: Nose normal.  Eyes:     General: No scleral icterus.       Right eye: No discharge.        Left eye: No discharge.      Conjunctiva/sclera: Conjunctivae normal.     Pupils: Pupils are equal, round, and reactive to light.     Right eye: No corneal abrasion or fluorescein uptake.  Cardiovascular:     Rate and Rhythm: Normal rate and regular rhythm.     Heart sounds: No murmur heard.   No friction rub. No gallop.  Pulmonary:     Effort: Pulmonary effort is normal. No respiratory distress.     Breath sounds: Normal breath sounds. No stridor. No rales.  Abdominal:     General: There is no distension.     Palpations: Abdomen is soft.     Tenderness: There is no abdominal tenderness.  Musculoskeletal:        General: No tenderness.     Cervical back: Normal range of motion and neck supple.  Skin:    General: Skin is warm and dry.     Findings: No erythema or rash.  Neurological:     Mental Status: He is alert and oriented to person, place, and time.     Comments: Right-sided facial droop involving the forehead. Extra ocular movements intact. 4-5 left lower extremity weakness. No right-sided extremity weakness. Sensation intact. Detailed neuro exam by neurology.    ED Results and Treatments Labs (all labs ordered are listed, but only abnormal results are displayed) Labs Reviewed  CBC - Abnormal; Notable for the following components:      Result Value   RBC 3.70 (*)    Hemoglobin 11.1 (*)    HCT 35.2 (*)    All  other components within normal limits  COMPREHENSIVE METABOLIC PANEL - Abnormal; Notable for the following components:   Glucose, Bld 142 (*)    Calcium 8.6 (*)    Albumin 3.0 (*)    AST 11 (*)    All other components within normal limits  I-STAT CHEM 8, ED - Abnormal; Notable for the following components:   Glucose, Bld 137 (*)    Calcium, Ion 1.12 (*)    Hemoglobin 10.9 (*)    HCT 32.0 (*)    All other components within normal limits  CBG MONITORING, ED - Abnormal; Notable for the following components:   Glucose-Capillary 134 (*)    All other components within normal limits  RESP  PANEL BY RT-PCR (FLU A&B, COVID) ARPGX2  PROTIME-INR  APTT  DIFFERENTIAL                                                                                                                         EKG  EKG Interpretation  Date/Time:  Monday February 05 2021 00:28:05 EDT Ventricular Rate:  73 PR Interval:  149 QRS Duration: 86 QT Interval:  417 QTC Calculation: 460 R Axis:   -17 Text Interpretation: Sinus rhythm Borderline left axis deviation No significant change since last tracing Confirmed by Addison Lank [phone]) on 02/05/2021 12:36:19 AM       Radiology CT HEAD CODE STROKE WO CONTRAST  Result Date: 02/05/2021 CLINICAL DATA:  Code stroke.  Acute neurologic deficit EXAM: CT HEAD WITHOUT CONTRAST TECHNIQUE: Contiguous axial images were obtained from the base of the skull through the vertex without intravenous contrast. COMPARISON:  None. FINDINGS: Brain: Unchanged appearance right convexity meningioma. There is generalized atrophy without lobar predilection. There is hypoattenuation of the periventricular white matter, most commonly indicating chronic ischemic microangiopathy. Vascular: No abnormal hyperdensity of the major intracranial arteries or dural venous sinuses. No intracranial atherosclerosis. Skull: The visualized skull base, calvarium and extracranial soft tissues are normal. Sinuses/Orbits: No fluid levels or advanced mucosal thickening of the visualized paranasal sinuses. No mastoid or middle ear effusion. The orbits are normal. ASPECTS [facility] Stroke Program Early CT Score) - Ganglionic level infarction (caudate, lentiform nuclei, internal capsule, insula, M1-M3 cortex): 7 - Supraganglionic infarction (M4-M6 cortex): 3 Total score (0-10 with 10 being normal): 10 IMPRESSION: 1. No acute intracranial abnormality. 2. ASPECTS is 10. 3. Unchanged appearance of right convexity meningioma. These results were communicated to Dr. Roland Rack at 12:28 am on 02/05/2021 by text page via  the [facility] messaging system. Electronically Signed   By: Ulyses Jarred M.D.   On: 02/05/2021 00:28    Pertinent labs & imaging results that were available during my care of the patient were reviewed by me and considered in my medical decision making (see MDM for details).  Medications Ordered in ED Medications  artificial tears (LACRILUBE) ophthalmic ointment 1 application (has no administration in time range)  sodium chloride flush (NS) 0.9 % injection 3 mL (3 mLs Intravenous Given 02/05/21  0156)  iohexol (OMNIPAQUE) 350 MG/ML injection 75 mL (75 mLs Intravenous Contrast Given 02/05/21 0025)  fluorescein ophthalmic strip 1 strip (1 strip Right Eye Given by Other 02/05/21 0156)                                                                                                                                     Procedures Procedures  (including critical care time)  Medical Decision Making / ED Course I have reviewed the nursing notes for this encounter and the patient's prior records (if available in EHR or on provided paperwork).  CAYNE KLESS was evaluated in Emergency Department on 02/05/2021 for the symptoms described in the history of present illness. He was evaluated in the context of the global COVID-19 pandemic, which necessitated consideration that the patient might be at risk for infection with the SARS-CoV-2 virus that causes COVID-19. Institutional protocols and algorithms that pertain to the evaluation of patients at risk for COVID-19 are in a state of rapid change based on information released by regulatory bodies including the CDC and federal and state organizations. These policies and algorithms were followed during the patient's care in the ED.     Patient brought in as a code stroke. Not tPA candidate. Taken immediately to CT scan. Screening labs obtained.  Pertinent labs & imaging results that were available during my care of the patient were reviewed by me and considered  in my medical decision making:  Labs grossly reassuring without leukocytosis.  No significant anemia.  No significant electrolyte derangements or renal sufficiency.   CT negative for ICH. Neurology evaluated patient and believes this is Bell's palsy but recommended admission for stroke rule out. No fluorescein uptake or corneal abrasion noted on exam. Lacrilube ordered.   Final Clinical Impression(s) / ED Diagnoses Final diagnoses:  Facial droop     This chart was dictated using voice recognition software.  Despite best efforts to proofread,  errors can occur which can change the documentation meaning.      Fatima Blank, MD 02/05/21 [phone]

## 2021-02-05 NOTE — ED Notes (Signed)
Placed a PrimoFit and new brief on PT and changed PT's bedsheet. PT also provided a fresh blanket

## 2021-02-05 NOTE — Social Work (Signed)
CSW met with Pt at bedside. Pt is A&Ox4 and is committed to working hard on his physical rehabilitation.   CSW attempted to contact family for further information to complete assessment and FL2. # for daughter [name] is not working. CSW tried daughter [name], left message requesting call back.

## 2021-02-05 NOTE — Evaluation (Signed)
Physical Therapy Evaluation Patient Details Name: Lance [name] MRN: [id number] DOB: 12/27/1945 Today's Date: 02/05/2021   History of Present Illness  75 yo admitted with R facial droop. MRI (-) for CVA and suggestive of Bell's palsy . PMH: DM; CVA, Hep C, schizophrenia, CHF, cocaine abuse. Recently discharged from [facility] 11/16/2020 s/p R frontal parietal parenchymal bleed. discharged home at that time with HHOT/PT with recommendations for Hoyer lift, hospital bed and wc. At that time, pt required Assist with ADL and total A +2 with stand pivot transfers.  Clinical Impression   Patient received in bed, pleasant and cooperative but with definite STM deficits and poor awareness of impairments/safety. Needed MaxAx2 for all aspects of functional mobility today. Family requesting rehab for ongoing therapy as HHPT f/u has been minimal. Left in ED stretcher with all needs met positioned to comfort this morning. Will benefit from SNF f/u at DC.     Follow Up Recommendations SNF;Supervision/Assistance - 24 hour    Equipment Recommendations  Hospital bed;Wheelchair (measurements PT);Wheelchair cushion (measurements PT);Other (comment) (hoyer lift and pads)    Recommendations for Other Services       Precautions / Restrictions Precautions Precautions: Fall Restrictions Weight Bearing Restrictions: No      Mobility  Bed Mobility Overal bed mobility: Needs Assistance Bed Mobility: Supine to Sit;Sit to Supine     Supine to sit: Max assist;+2 for physical assistance Sit to supine: Max assist;+2 for physical assistance        Transfers Overall transfer level: Needs assistance Equipment used: 2 person hand held assist Transfers: Sit to/from Stand Sit to Stand: Max assist;+2 physical assistance         General transfer comment: posterior and L bias  Ambulation/Gait             General Gait Details: unable  Stairs            Wheelchair Mobility    Modified Rankin  (Stroke Patients Only)       Balance Overall balance assessment: Needs assistance   Sitting balance-Leahy Scale: Poor       Standing balance-Leahy Scale: Zero                               Pertinent Vitals/Pain Pain Assessment: Faces Faces Pain Scale: Hurts even more Pain Location: neck Pain Descriptors / Indicators: Aching;Grimacing;Discomfort Pain Intervention(s): Limited activity within patient's tolerance;Monitored during session;Patient requesting pain meds-RN notified;Repositioned    Home Living Family/patient expects to be discharged to:: Private residence Living Arrangements: Children Available Help at Discharge: Family;Available 24 hours/day;Other (Comment);Personal care attendant Type of Home: House Home Access: Stairs to enter Entrance Stairs-Rails: Right;Left;Can reach both Entrance Stairs-Number of Steps: 4 Home Layout: One level Home Equipment: Walker - 4 wheels;Cane - single point;Wheelchair - manual;Hospital bed (hoyer) Additional Comments: Pt unable to communicate home set up due to AMS and lethargy; per chart review, pt lives at home alone and has used SPC for ambulation.    Prior Function Level of Independence: Needs assistance         Comments: daughter has been assisting with all ADL and has been walking pt to the bathroom with "Max A" and use of a fold up chair. Pt's equipment is at his house adn daughter dow not have equipment in her home. Was active with HHPT however daughter states they only "got him out of the bed 1 time"     Hand Dominance     Dominant Hand: Right    Extremity/Trunk Assessment   Upper Extremity Assessment Upper Extremity Assessment: Defer to OT evaluation LUE Deficits / Details: usin gfunctionally with coordiantion deficits noted    Lower Extremity Assessment Lower Extremity Assessment: Generalized weakness    Cervical / Trunk Assessment Cervical / Trunk Assessment: Other exceptions;Kyphotic Cervical /  Trunk Exceptions: with L bias  Communication   Communication: No difficulties  Cognition Arousal/Alertness: Awake/alert Behavior During Therapy: Impulsive;Restless Overall Cognitive Status: No family/caregiver present to determine baseline cognitive functioning Area of Impairment: Orientation;Attention;Memory;Following commands;Safety/judgement;Awareness;Problem solving                 Orientation Level: Disoriented to;Time Current Attention Level: Sustained Memory: Decreased short-term memory Following Commands: Follows one step commands inconsistently Safety/Judgement: Decreased awareness of safety;Decreased awareness of deficits Awareness: Intellectual Problem Solving: Slow processing;Difficulty sequencing General Comments: most likely at baseline s/p CVA. Daughter staes that "he just doesn't want to do anything". Able to recall 1/3 words.      General Comments      Exercises     Assessment/Plan    PT Assessment Patient needs continued PT services  PT Problem List Decreased strength;Decreased cognition;Decreased activity tolerance;Decreased safety awareness;Decreased balance;Decreased knowledge of use of DME;Decreased mobility;Decreased coordination       PT Treatment Interventions DME instruction;Balance training;Gait training;Cognitive remediation;Functional mobility training;Patient/family education;Therapeutic activities;Therapeutic exercise    PT Goals (Current goals can be found in the Care Plan section)  Acute Rehab PT Goals Patient Stated Goal: to get some more rehab PT Goal Formulation: With patient/family Time For Goal Achievement: 02/19/21 Potential to Achieve Goals: Good    Frequency Min 2X/week   Barriers to discharge        Co-evaluation PT/OT/SLP Co-Evaluation/Treatment: Yes Reason for Co-Treatment: To address functional/ADL transfers;For patient/therapist safety;Complexity of the patient's impairments (multi-system involvement) PT goals  addressed during session: Mobility/safety with mobility;Balance;Strengthening/ROM         AM-PAC PT "6 Clicks" Mobility  Outcome Measure Help needed turning from your back to your side while in a flat bed without using bedrails?: A Lot Help needed moving from lying on your back to sitting on the side of a flat bed without using bedrails?: Total Help needed moving to and from a bed to a chair (including a wheelchair)?: Total Help needed standing up from a chair using your arms (e.g., wheelchair or bedside chair)?: Total Help needed to walk in hospital room?: Total Help needed climbing 3-5 steps with a railing? : Total 6 Click Score: 7    End of Session Equipment Utilized During Treatment: Gait belt Activity Tolerance: Patient tolerated treatment well Patient left: in bed;with call bell/phone within reach (ED stretcher) Nurse Communication: Mobility status PT Visit Diagnosis: Muscle weakness (generalized) (M62.81);Other symptoms and signs involving the nervous system (R29.898);Other abnormalities of gait and mobility (R26.89);Difficulty in walking, not elsewhere classified (R26.2)    Time: 1004-1030 PT Time Calculation (min) (ACUTE ONLY): 26 min   Charges:   PT Evaluation $PT Eval Moderate Complexity: 1 Mod (co-eval with OT)          U PT, DPT, PN2   Supplemental Physical Therapist [place]    Pager [phone] Acute Rehab Office [phone]  

## 2021-02-05 NOTE — ED Triage Notes (Signed)
Pt BIBA via [facility] from home. Pt presents to ED with slurred speech and new onset right sided weakness. Per medic patient LKW was either Friday evening or Saturday morning according to patient family. Pt has hx of CVA that caused patient to have deficits on the left side. Patient A&Ox4, VSS with CBG of 134 and BP of 133/64 per medic. IV access noted PTA in right forearm.

## 2021-02-05 NOTE — NC FL2 (Addendum)
[place] LEVEL OF CARE SCREENING TOOL     IDENTIFICATION  Patient Name: Lance [name] Birthdate: [birth year] Sex: male Admission Date (Current Location): 02/04/2021  [facility] and [place] Number:  Herbalist and Address:  [facility], [place] [address] [place], [place] [address]      Provider Number: [id number]  Attending Physician Name and Address:  Elmarie Shiley, MD  Relative Name and Phone Number:  Magdalene Molly Daughter   [phone]    Current Level of Care: Hospital Recommended Level of Care: [place] Prior Approval Number:    Date Approved/Denied:   PASRR Number: [id number]  Discharge Plan: SNF    Current Diagnoses: Patient Active Problem List   Diagnosis Date Noted   Right sided weakness 02/05/2021   BPH (benign prostatic hyperplasia) 02/05/2021   Chronic diastolic CHF (congestive heart failure) ([place]) 02/05/2021   Controlled type 2 diabetes mellitus with hyperglycemia ([place])    Essential hypertension    Vascular headache    Spastic hemiparesis ([place])    Intraparenchymal hemorrhage of brain ([place]) 10/19/2020   Left hemiplegia ([place]) 10/19/2020   ICH (intracerebral hemorrhage) ([place]) 10/14/2020   Weakness    Acute metabolic encephalopathy    Silent micro-hemorrhage of brain ([place])    Functional assessment declined    Malnutrition of moderate degree 05/26/2020   Subacute delirium 05/16/2020   Syncope 05/15/2020   Sepsis ([place]) 05/14/2020   Leg pain 12/07/2018   PAD (peripheral artery disease) ([place]) 12/07/2018   Acute upper GI bleed 10/10/2016   Hypotension    Polysubstance abuse ([place]) 03/10/2015   Mass of arm 03/10/2015   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 03/10/2015   Perianal abscess 08/31/2014   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 ([place]) 07/07/2014   Adenomatous polyp of colon 08/30/2013   Type 2 diabetes mellitus with hyperglycemia, without long-term current use of insulin ([place]) 08/26/2013   Hepatitis  C virus infection without hepatic coma 03/21/2013   Need for hepatitis B vaccination 03/21/2013   Depression 10/23/2012   IVDU (intravenous drug user) 10/23/2012   Schizophrenia ([place]) 10/23/2012   Carpal tunnel syndrome on both sides 07/31/2012   Diabetic peripheral neuropathy ([place]) 07/31/2012   Osteoarthritis of both hips 07/31/2012   GERD without esophagitis 10/21/2009   Diverticulitis of colon 10/21/2009    Orientation RESPIRATION BLADDER Height & Weight     Self, Time, Situation, Place  Normal Incontinent Weight: 175 lb (79.4 kg) Height:  '5\' 9"'$  (175.3 cm)  BEHAVIORAL SYMPTOMS/MOOD NEUROLOGICAL BOWEL NUTRITION STATUS      Continent Diet (Heart Healthy)  AMBULATORY STATUS COMMUNICATION OF NEEDS Skin   Extensive Assist Verbally Normal                       Personal Care Assistance Level of Assistance  Bathing, Feeding, Dressing Bathing Assistance: Maximum assistance Feeding assistance: Independent Dressing Assistance: Maximum assistance     Functional Limitations Info  Sight, Hearing, Speech Sight Info: Adequate Hearing Info: Adequate Speech Info: Adequate    SPECIAL CARE FACTORS FREQUENCY  PT (By licensed PT), OT (By licensed OT)     PT Frequency: 5x weekly OT Frequency: 5x weekly            Contractures Contractures Info: Not present    Additional Factors Info  Code Status Code Status Info: Full             Current Medications (02/05/2021):  This is the current hospital active medication  list Current Facility-Administered Medications  Medication Dose Route Frequency Provider Last Rate Last Admin   acetaminophen (TYLENOL) tablet 650 mg  650 mg Oral Q4H PRN Shalhoub, Sherryll Burger, MD       Or   acetaminophen (TYLENOL) 160 MG/5ML solution 650 mg  650 mg Per Tube Q4H PRN Shalhoub, Sherryll Burger, MD       Or   acetaminophen (TYLENOL) suppository 650 mg  650 mg Rectal Q4H PRN Shalhoub, Sherryll Burger, MD       albuterol (PROVENTIL) (2.5 MG/3ML) 0.083% nebulizer  solution 2.5 mg  2.5 mg Inhalation Q6H PRN Shalhoub, Sherryll Burger, MD       amLODipine (NORVASC) tablet 10 mg  10 mg Oral Daily Regalado, Belkys A, MD   10 mg at 02/05/21 1651   artificial tears (LACRILUBE) ophthalmic ointment 1 application  1 application Right Eye [id number] PRN Shalhoub, Sherryll Burger, MD       atorvastatin (LIPITOR) tablet 20 mg  20 mg Oral Daily Shalhoub, Sherryll Burger, MD   20 mg at 02/05/21 0932   busPIRone (BUSPAR) tablet 5 mg  5 mg Oral BID Regalado, Belkys A, MD   5 mg at 02/05/21 1652   citalopram (CELEXA) tablet 20 mg  20 mg Oral Daily Regalado, Belkys A, MD   20 mg at 02/05/21 1651   docusate sodium (COLACE) capsule 100 mg  100 mg Oral BID Vernelle Emerald, MD   100 mg at 02/05/21 0931   enoxaparin (LOVENOX) injection 40 mg  40 mg Subcutaneous Daily Shalhoub, Sherryll Burger, MD   40 mg at 02/05/21 1414   gabapentin (NEURONTIN) capsule 100 mg  100 mg Oral TID Vernelle Emerald, MD   100 mg at 02/05/21 1651   hydrALAZINE (APRESOLINE) injection 10 mg  10 mg Intravenous Q6H PRN Shalhoub, Sherryll Burger, MD       insulin aspart (novoLOG) injection 0-15 Units  0-15 Units Subcutaneous TID AC & HS Shalhoub, Sherryll Burger, MD   11 Units at 02/05/21 1822   insulin aspart (novoLOG) injection 10 Units  10 Units Subcutaneous TID WC Vernelle Emerald, MD   10 Units at 02/05/21 1822   insulin detemir (LEVEMIR) injection 23 Units  23 Units Subcutaneous QHS Shalhoub, Sherryll Burger, MD       melatonin tablet 3 mg  3 mg Oral QHS Shalhoub, Sherryll Burger, MD       OLANZapine (ZYPREXA) tablet 5 mg  5 mg Oral QHS Shalhoub, Sherryll Burger, MD       ondansetron [facility]) injection 4 mg  4 mg Intravenous Q6H PRN Shalhoub, Sherryll Burger, MD       pantoprazole (PROTONIX) EC tablet 40 mg  40 mg Oral QHS Shalhoub, Sherryll Burger, MD       polyethylene glycol (MIRALAX / GLYCOLAX) packet 17 g  17 g Oral Daily PRN Shalhoub, Sherryll Burger, MD       polyvinyl alcohol (LIQUIFILM TEARS) 1.4 % ophthalmic solution 1 drop  1 drop Both Eyes PRN Shalhoub, Sherryll Burger, MD        predniSONE (DELTASONE) tablet 60 mg  60 mg Oral Q breakfast Derek Jack, MD   60 mg at 02/05/21 1037   senna-docusate (Senokot-S) tablet 1 tablet  1 tablet Oral BID Vernelle Emerald, MD   1 tablet at 02/05/21 0931   tamsulosin (FLOMAX) capsule 0.4 mg  0.4 mg Oral QPC supper Vernelle Emerald, MD   0.4 mg at 02/05/21 1823   traMADol (ULTRAM) tablet  50 mg  50 mg Oral Q12H PRN Shalhoub, Sherryll Burger, MD       valACYclovir (VALTREX) tablet 1,000 mg  1,000 mg Oral TID Derek Jack, MD   1,000 mg at 02/05/21 1651   vitamin B-12 (CYANOCOBALAMIN) tablet 1,000 mcg  1,000 mcg Oral Daily Shalhoub, Sherryll Burger, MD   1,000 mcg at 02/05/21 [id number]   Current Outpatient Medications  Medication Sig Dispense Refill   acetaminophen (TYLENOL) 325 MG tablet Take 2 tablets (650 mg total) by mouth every 4 (four) hours as needed for mild pain (or temp > 37.5 C (99.5 F)).     albuterol (VENTOLIN HFA) 108 (90 Base) MCG/ACT inhaler Inhale 2 puffs into the lungs every 6 (six) hours as needed for wheezing or shortness of breath. 18 g 0   amLODipine (NORVASC) 10 MG tablet Take 1 tablet (10 mg total) by mouth daily. 30 tablet 0   atorvastatin (LIPITOR) 20 MG tablet Take 1 tablet (20 mg total) by mouth daily. 30 tablet 0   busPIRone (BUSPAR) 5 MG tablet Take 5 mg by mouth 2 (two) times daily.     citalopram (CELEXA) 20 MG tablet Take 20 mg by mouth daily.     cyanocobalamin 1000 MCG tablet Take 1 tablet (1,000 mcg total) by mouth daily. 30 tablet 0   docusate sodium (COLACE) 100 MG capsule Take 1 capsule (100 mg total) by mouth 2 (two) times daily. 60 capsule 2   ferrous sulfate 325 (65 FE) MG tablet Take 1 tablet (325 mg total) by mouth daily with breakfast. 30 tablet 0   insulin aspart (NOVOLOG FLEXPEN) 100 UNIT/ML FlexPen Inject 10 Units into the skin 3 (three) times daily with meals. 15 mL 11   insulin detemir (LEVEMIR FLEXTOUCH) 100 UNIT/ML FlexPen Inject 23 Units into the skin at bedtime. 15 mL 11   melatonin 3 MG TABS  tablet Take 1 tablet (3 mg total) by mouth at bedtime. 30 tablet 0   Multiple Vitamin (MULTI-VITAMIN) tablet Take 1 tablet by mouth daily.     OLANZapine (ZYPREXA) 5 MG tablet Take 1 tablet (5 mg total) by mouth at bedtime. 30 tablet 0   pantoprazole (PROTONIX) 40 MG tablet Take 1 tablet (40 mg total) by mouth at bedtime. 30 tablet 0   tamsulosin (FLOMAX) 0.4 MG CAPS capsule Take 1 capsule (0.4 mg total) by mouth daily after supper. 30 capsule 0   Diapers & Supplies MISC 1 Units by Does not apply route every 2 (two) hours as needed. 100 Units 0   diclofenac Sodium (VOLTAREN) 1 % GEL Apply 2 g topically 4 (four) times daily. (Patient not taking: Reported on 02/05/2021) 2 g 0   gabapentin (NEURONTIN) 100 MG capsule Take 1 capsule (100 mg total) by mouth 3 (three) times daily. (Patient not taking: Reported on 02/05/2021) 90 capsule 0   lidocaine (LIDODERM) 5 % Place 1 patch onto the skin daily. Remove & Discard patch within 12 hours or as directed by MD (Patient not taking: Reported on 02/05/2021) 30 patch 0   polyethylene glycol (MIRALAX / GLYCOLAX) 17 g packet Take 17 g by mouth daily as needed for mild constipation. (Patient not taking: Reported on 02/05/2021) 30 each 0   polyvinyl alcohol (ARTIFICIAL TEARS) 1.4 % ophthalmic solution Place 1 drop into both eyes as needed for dry eyes. (Patient not taking: Reported on 02/05/2021) 15 mL 0   senna-docusate (SENOKOT-S) 8.6-50 MG tablet Take 1 tablet by mouth 2 (two) times daily. (Patient not  taking: Reported on 02/05/2021)     tiZANidine (ZANAFLEX) 2 MG tablet Take 1 tablet (2 mg total) by mouth 4 (four) times daily -  with meals and at bedtime. (Patient not taking: Reported on 02/05/2021) 90 tablet 0   traMADol (ULTRAM) 50 MG tablet Take 1 tablet (50 mg total) by mouth every 12 (twelve) hours as needed for moderate pain. (Patient not taking: Reported on 02/05/2021) 30 tablet 0     Discharge Medications: Please see discharge summary for a list of discharge  medications.  Relevant Imaging Results:  Relevant Lab Results:   Additional Information SSN:  [id number]  Vergie Living, LCSW

## 2021-02-05 NOTE — Progress Notes (Signed)
PROGRESS NOTE    Lance [name]  [id number] DOB: 1945-08-26 DOA: 02/04/2021 PCP: Theotis Burrow, MD   Brief Narrative: 75 year old with past medical history significant for insulin-dependent diabetes type 2, previous stroke, hepatitis C, schizophrenia, recent diagnosis of right frontal intraparenchymal hemorrhage in May [id number], diastolic heart failure meningioma, hyperlipidemia, peripheral artery disease, cocaine abuse, GERD, BPH, presents to ED for evaluation of right side weakness.  Patient developed right facial droop between Thursday and Saturday.  His  facial droop persisted for 2 days and he also developed associated right-sided weakness and difficulty with performing ADLs.   Patient was evaluated by neurology, they could not identify any actual extremity weakness but only right facial weakness.  Since CT head without acute stroke.  Patient was admitted for further evaluation and stroke work-up.  After further evaluation patient was diagnosed with right side Bell's palsy   Assessment & Plan:   Principal Problem:   Right sided weakness Active Problems:   Hepatitis C virus infection without hepatic coma   Schizophrenia (HCC)   Type 2 diabetes mellitus with hyperglycemia, without long-term current use of insulin (HCC)   GERD without esophagitis   BPH (benign prostatic hyperplasia)   Chronic diastolic CHF (congestive heart failure) (HCC)  1-Right side Bell's palsy: -MRI: Mild asymmetric enhancement of the right IAC fundus suggestive of Bell's palsy given the history.  No acute intracranial hemorrhage or infarct.  Stable right frontal meningioma.  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 -Discussed with neurology plan for prednisone and acyclovir for 7 days.  No need for prednisone taper.  Okay to discontinue aspirin.  2-History of hepatitis C without coma Follow up As an  outpatient.  3-History of schizophrenia: Continue with Zyprexa  4-Type 2 diabetes with hyperglycemia: A1C: On Levemir 23 units daily. 10 units novolog meal coverage.  SSI  5-GERD: Continue with PPI  6-BPH: Continue with Flomax  7-Chronic diastolic heart failure: Compensated  8-HTN; Resume Norvasc.   Estimated body mass index is 25.84 kg/m as calculated from the following:   Height as of this encounter: '5\' 9"'$  (1.753 m).   Weight as of this encounter: 79.4 kg.   DVT prophylaxis: Lovenox Code Status: Full Code Family Communication: No family at bedside.  Disposition Plan:  Status is: Observation  The patient remains OBS appropriate and will d/c before 2 midnights.  Dispo: The patient is from: Home              Anticipated d/c is to: SNF              Patient currently is medically stable to d/c.   Difficult to place patient No        Consultants:  Neurology   Procedures:    Antimicrobials:    Subjective: He has right side facial weakness. Lacrimation right eye   Objective: Vitals:   02/05/21 1100 02/05/21 1215 02/05/21 1230 02/05/21 1400  BP: (!) 157/100 (!) 142/83 (!) 141/88 (!) 151/85  Pulse: 72 70 68 70  Resp: (!) '21 18 16 17  '$ Temp: 97.9 F (36.6 C)     TempSrc: Oral     SpO2: 100% 100% 100% 100%  Weight:      Height:       No intake or output data in the 24 hours ending 02/05/21 1447 Filed Weights   02/05/21 0133  Weight: 79.4 kg    Examination:  General exam: Appears calm  and comfortable  Respiratory system: Clear to auscultation. Respiratory effort normal. Cardiovascular system: S1 & S2 heard, RRR. No JVD, murmurs, rubs, gallops or clicks. No pedal edema. Gastrointestinal system: Abdomen is nondistended, soft and nontender. No organomegaly or masses felt. Normal bowel sounds heard. Central nervous system: Alert and oriented. Right side facial weakness, left side weakness old Extremities: Left side weakness   Data Reviewed: I have  personally reviewed following labs and imaging studies  CBC: Recent Labs  Lab 02/05/21 0000 02/05/21 0007  WBC 9.9  --   NEUTROABS 7.3  --   HGB 11.1* 10.9*  HCT 35.2* 32.0*  MCV 95.1  --   PLT 206  --    Basic Metabolic Panel: Recent Labs  Lab 02/05/21 0000 02/05/21 0007  NA 141 143  K 3.6 3.6  CL 108 106  CO2 26  --   GLUCOSE 142* 137*  BUN 11 12  CREATININE 1.12 1.10  CALCIUM 8.6*  --    GFR: Estimated Creatinine Clearance: 59.8 mL/min (by C-G formula based on SCr of 1.1 mg/dL). Liver Function Tests: Recent Labs  Lab 02/05/21 0000  AST 11*  ALT 13  ALKPHOS 95  BILITOT 0.6  PROT 6.5  ALBUMIN 3.0*   No results for input(s): LIPASE, AMYLASE in the last 168 hours. No results for input(s): AMMONIA in the last 168 hours. Coagulation Profile: Recent Labs  Lab 02/05/21 0000  INR 1.2   Cardiac Enzymes: No results for input(s): CKTOTAL, CKMB, CKMBINDEX, TROPONINI in the last 168 hours. BNP (last 3 results) No results for input(s): PROBNP in the last 8760 hours. HbA1C: Recent Labs    02/05/21 0000  HGBA1C 8.6*   CBG: Recent Labs  Lab 02/05/21 0001 02/05/21 0912 02/05/21 1258  GLUCAP 134* 210* 95   Lipid Profile: No results for input(s): CHOL, HDL, LDLCALC, TRIG, CHOLHDL, LDLDIRECT in the last 72 hours. Thyroid Function Tests: No results for input(s): TSH, T4TOTAL, FREET4, T3FREE, THYROIDAB in the last 72 hours. Anemia Panel: No results for input(s): VITAMINB12, FOLATE, FERRITIN, TIBC, IRON, RETICCTPCT in the last 72 hours. Sepsis Labs: No results for input(s): PROCALCITON, LATICACIDVEN in the last 168 hours.  Recent Results (from the past 240 hour(s))  Resp Panel by RT-PCR (Flu A&B, Covid) Nasopharyngeal Swab     Status: None   Collection Time: 02/05/21 12:01 AM   Specimen: Nasopharyngeal Swab; Nasopharyngeal(NP) swabs in vial transport medium  Result Value Ref Range Status   SARS Coronavirus 2 by RT PCR NEGATIVE NEGATIVE Final    Comment:  (NOTE) SARS-CoV-2 target nucleic acids are NOT DETECTED.  The SARS-CoV-2 RNA is generally detectable in upper respiratory specimens during the acute phase of infection. The lowest concentration of SARS-CoV-2 viral copies this assay can detect is 138 copies/mL. A negative result does not preclude SARS-Cov-2 infection and should not be used as the sole basis for treatment or other patient management decisions. A negative result may occur with  improper specimen collection/handling, submission of specimen other than nasopharyngeal swab, presence of viral mutation(s) within the areas targeted by this assay, and inadequate number of viral copies(<138 copies/mL). A negative result must be combined with clinical observations, patient history, and epidemiological information. The expected result is Negative.  Fact Sheet for Patients:  EntrepreneurPulse.com.au  Fact Sheet for Healthcare Providers:  IncredibleEmployment.be  This test is no t yet approved or cleared by the [place] FDA and  has been authorized for detection and/or diagnosis of SARS-CoV-2 by FDA under an Emergency Use  Authorization (EUA). This EUA will remain  in effect (meaning this test can be used) for the duration of the COVID-19 declaration under Section 564(b)(1) of the Act, 21 U.S.C.section 360bbb-3(b)(1), unless the authorization is terminated  or revoked sooner.       Influenza A by PCR NEGATIVE NEGATIVE Final   Influenza B by PCR NEGATIVE NEGATIVE Final    Comment: (NOTE) The Xpert Xpress SARS-CoV-2/FLU/RSV plus assay is intended as an aid in the diagnosis of influenza from Nasopharyngeal swab specimens and should not be used as a sole basis for treatment. Nasal washings and aspirates are unacceptable for Xpert Xpress SARS-CoV-2/FLU/RSV testing.  Fact Sheet for Patients: EntrepreneurPulse.com.au  Fact Sheet for Healthcare  Providers: IncredibleEmployment.be  This test is not yet approved or cleared by the [place] FDA and has been authorized for detection and/or diagnosis of SARS-CoV-2 by FDA under an Emergency Use Authorization (EUA). This EUA will remain in effect (meaning this test can be used) for the duration of the COVID-19 declaration under Section 564(b)(1) of the Act, 21 U.S.C. section 360bbb-3(b)(1), unless the authorization is terminated or revoked.  Performed at [facility], [place] [address], [place], [place] [address]          Radiology Studies: MR BRAIN W WO CONTRAST  Result Date: 02/05/2021 CLINICAL DATA:  Evaluate for Bell's palsy versus stroke EXAM: MRI HEAD WITHOUT AND WITH CONTRAST TECHNIQUE: Multiplanar, multiecho pulse sequences of the brain and surrounding structures were obtained without and with intravenous contrast. CONTRAST:  58m GADAVIST GADOBUTROL 1 MMOL/ML IV SOLN COMPARISON:  Same day CT/CTA head, brain MRI 10/14/2020 CT head 05/16/2020 FINDINGS: Brain: There is no evidence of acute intracranial hemorrhage, extra-axial fluid collection, or infarct. There is a homogeneously enhancing extra-axial lesion over the right frontal convexity measuring up to 5.9 cm AP in the sagittal plane consistent with a meningioma, unchanged. The lesion again exerts mass effect on the underlying frontal lobe. There is no midline shift. Extensive FLAIR signal abnormality throughout the subcortical and periventricular white matter is not significantly changed, likely reflecting sequela of advanced chronic white matter microangiopathy. The ventricles are stable in size. A prior intraparenchymal hemorrhage in the right superior frontal gyrus is again seen. Faint curvilinear enhancement at the periphery of the prior hemorrhage site is nonspecific. Extensive chronic microhemorrhages are again seen throughout the cerebral hemispheres concerning for underlying cerebral amyloid  angiopathy. There is mild asymmetric enhancement at the fundus of the right IAC (18-15). There is no other abnormal enhancement. Vascular: Normal flow voids. Skull and upper cervical spine: Normal marrow signal. Sinuses/Orbits: There is mild mucosal thickening in the ethmoid air cells. The globes and orbits are unremarkable. Other: None. IMPRESSION: 1. Mild asymmetric enhancement of the right IAC fundus suggestive of Bell's palsy given the history. 2. No acute intracranial hemorrhage or infarct. 3. Stable right frontal meningioma. 4. 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 5. Advanced chronic white matter microangiopathy, unchanged. Electronically Signed   By: PValetta MoleM.D.   On: 02/05/2021 08:54   CT HEAD CODE STROKE WO CONTRAST  Result Date: 02/05/2021 CLINICAL DATA:  Code stroke.  Acute neurologic deficit EXAM: CT HEAD WITHOUT CONTRAST TECHNIQUE: Contiguous axial images were obtained from the base of the skull through the vertex without intravenous contrast. COMPARISON:  None. FINDINGS: Brain: Unchanged appearance right convexity meningioma. There is generalized atrophy without lobar predilection. There is hypoattenuation of the periventricular white matter,  most commonly indicating chronic ischemic microangiopathy. Vascular: No abnormal hyperdensity of the major intracranial arteries or dural venous sinuses. No intracranial atherosclerosis. Skull: The visualized skull base, calvarium and extracranial soft tissues are normal. Sinuses/Orbits: No fluid levels or advanced mucosal thickening of the visualized paranasal sinuses. No mastoid or middle ear effusion. The orbits are normal. ASPECTS [facility] Stroke Program Early CT Score) - Ganglionic level infarction (caudate, lentiform nuclei, internal capsule, insula, M1-M3 cortex): 7 - Supraganglionic infarction (M4-M6 cortex): 3 Total score (0-10 with 10  being normal): 10 IMPRESSION: 1. No acute intracranial abnormality. 2. ASPECTS is 10. 3. Unchanged appearance of right convexity meningioma. These results were communicated to Dr. Roland Rack at 12:28 am on 02/05/2021 by text page via the [facility] messaging system. Electronically Signed   By: Ulyses Jarred M.D.   On: 02/05/2021 00:28   CT ANGIO HEAD CODE STROKE  Result Date: 02/05/2021 CLINICAL DATA:  Acute neurologic deficit EXAM: CT ANGIOGRAPHY HEAD AND NECK TECHNIQUE: Multidetector CT imaging of the head and neck was performed using the standard protocol during bolus administration of intravenous contrast. Multiplanar CT image reconstructions and MIPs were obtained to evaluate the vascular anatomy. Carotid stenosis measurements (when applicable) are obtained utilizing NASCET criteria, using the distal internal carotid diameter as the denominator. CONTRAST:  24m OMNIPAQUE IOHEXOL 350 MG/ML SOLN COMPARISON:  None. FINDINGS: CTA NECK FINDINGS SKELETON: There is no bony spinal canal stenosis. No lytic or blastic lesion. OTHER NECK: Normal pharynx, larynx and major salivary glands. No cervical lymphadenopathy. Unremarkable thyroid gland. UPPER CHEST: No pneumothorax or pleural effusion. No nodules or masses. AORTIC ARCH: There is calcific atherosclerosis of the aortic arch. There is no aneurysm, dissection or hemodynamically significant stenosis of the visualized portion of the aorta. Conventional 3 vessel aortic branching pattern. The visualized proximal subclavian arteries are widely patent. RIGHT CAROTID SYSTEM: Normal without aneurysm, dissection or stenosis. LEFT CAROTID SYSTEM: Normal without aneurysm, dissection or stenosis. VERTEBRAL ARTERIES: Left dominant configuration. Both origins are clearly patent. There is no dissection, occlusion or flow-limiting stenosis to the skull base (V1-V3 segments). CTA HEAD FINDINGS POSTERIOR CIRCULATION: --Vertebral arteries: Normal V4 segments. --Inferior cerebellar  arteries: Normal. --Basilar artery: Normal. --Superior cerebellar arteries: Normal. --Posterior cerebral arteries (PCA): Normal. ANTERIOR CIRCULATION: --Intracranial internal carotid arteries: Normal. --Anterior cerebral arteries (ACA): Normal. Both A1 segments are present. Patent anterior communicating artery (a-comm). --Middle cerebral arteries (MCA): Normal. VENOUS SINUSES: As permitted by contrast timing, patent. ANATOMIC VARIANTS: None Review of the MIP images confirms the above findings. IMPRESSION: 1. No emergent large vessel occlusion or hemodynamically significant stenosis. Aortic Atherosclerosis (ICD10-I70.0). Electronically Signed   By: KUlyses JarredM.D.   On: 02/05/2021 00:41   CT ANGIO NECK CODE STROKE  Result Date: 02/05/2021 CLINICAL DATA:  Acute neurologic deficit EXAM: CT ANGIOGRAPHY HEAD AND NECK TECHNIQUE: Multidetector CT imaging of the head and neck was performed using the standard protocol during bolus administration of intravenous contrast. Multiplanar CT image reconstructions and MIPs were obtained to evaluate the vascular anatomy. Carotid stenosis measurements (when applicable) are obtained utilizing NASCET criteria, using the distal internal carotid diameter as the denominator. CONTRAST:  720mOMNIPAQUE IOHEXOL 350 MG/ML SOLN COMPARISON:  None. FINDINGS: CTA NECK FINDINGS SKELETON: There is no bony spinal canal stenosis. No lytic or blastic lesion. OTHER NECK: Normal pharynx, larynx and major salivary glands. No cervical lymphadenopathy. Unremarkable thyroid gland. UPPER CHEST: No pneumothorax or pleural effusion. No nodules or masses. AORTIC ARCH: There is calcific atherosclerosis of the aortic arch. There is  no aneurysm, dissection or hemodynamically significant stenosis of the visualized portion of the aorta. Conventional 3 vessel aortic branching pattern. The visualized proximal subclavian arteries are widely patent. RIGHT CAROTID SYSTEM: Normal without aneurysm, dissection or  stenosis. LEFT CAROTID SYSTEM: Normal without aneurysm, dissection or stenosis. VERTEBRAL ARTERIES: Left dominant configuration. Both origins are clearly patent. There is no dissection, occlusion or flow-limiting stenosis to the skull base (V1-V3 segments). CTA HEAD FINDINGS POSTERIOR CIRCULATION: --Vertebral arteries: Normal V4 segments. --Inferior cerebellar arteries: Normal. --Basilar artery: Normal. --Superior cerebellar arteries: Normal. --Posterior cerebral arteries (PCA): Normal. ANTERIOR CIRCULATION: --Intracranial internal carotid arteries: Normal. --Anterior cerebral arteries (ACA): Normal. Both A1 segments are present. Patent anterior communicating artery (a-comm). --Middle cerebral arteries (MCA): Normal. VENOUS SINUSES: As permitted by contrast timing, patent. ANATOMIC VARIANTS: None Review of the MIP images confirms the above findings. IMPRESSION: 1. No emergent large vessel occlusion or hemodynamically significant stenosis. Aortic Atherosclerosis (ICD10-I70.0). Electronically Signed   By: Ulyses Jarred M.D.   On: 02/05/2021 00:41        Scheduled Meds:  atorvastatin  20 mg Oral Daily   docusate sodium  100 mg Oral BID   enoxaparin (LOVENOX) injection  40 mg Subcutaneous Daily   gabapentin  100 mg Oral TID   insulin aspart  0-15 Units Subcutaneous TID AC & HS   insulin aspart  10 Units Subcutaneous TID WC   insulin detemir  23 Units Subcutaneous QHS   melatonin  3 mg Oral QHS   OLANZapine  5 mg Oral QHS   pantoprazole  40 mg Oral QHS   predniSONE  60 mg Oral Q breakfast   senna-docusate  1 tablet Oral BID   tamsulosin  0.4 mg Oral QPC supper   valACYclovir  1,000 mg Oral TID   cyanocobalamin  1,000 mcg Oral Daily   Continuous Infusions:   LOS: 0 days    Time spent: 35 minutes.     Elmarie Shiley, MD [facility]   If 7PM-7AM, please contact night-coverage [email or web address]  02/05/2021, 2:47 PM

## 2021-02-05 NOTE — H&P (Signed)
History and Physical    KOLESON ATA [id number] DOB: 01-Sep-1945 DOA: 02/04/2021  PCP: Theotis Burrow, MD  Patient coming from: Home via EMS   Chief Complaint:  Chief Complaint  Patient presents with   Weakness     HPI:    75 year old male with past medical history of insulin-dependent diabetes mellitus type 2, previous stroke, hepatitis C, schizophrenia, recent diagnosis of right frontal intraparenchymal hemorrhage in May [id number], diastolic congestive heart failure (Echo 10/2020 EF 70-75% with G1DD), meningioma, hyperlipidemia, peripheral artery disease, cocaine abuse, gastroesophageal reflux disease and benign prostatic hyperplasia who presents to [facility] emergency department for evaluation via EMS due to development of right-sided weakness.  History is been obtained both from discussion from staff as well as speaking to patient and review of EMS notes.  Patient explains that he started to experience right facial droop anytime between Thursday and Saturday.  Patient is not entirely sure when.  Family believes that it started Saturday.  His facial droop persisted over the following 1 to 2 days and on Sunday the patient began to develop some associated right-sided weakness.  Patient's right-sided weakness began to worsen shortly thereafter leading to the patient having significant difficulty with ambulation and ADLs.  Patient denies any associated headache, changes in vision or confusion.  Because of patient's new onset and progressively worsening right-sided weakness EMS was contacted.  Upon EMS arrival, a code stroke was called and patient was promptly brought into [facility] emergency department for evaluation.  Upon evaluation in the emergency department, patient was promptly evaluated by neurology.  tPA was not administered due to the patient being well outside of the appropriate window.  Furthermore patient suffered an intraparenchymal hemorrhage in May.   On neurology evaluation, they could not identify any actual extremity weakness but only right facial weakness.  Noncontrast CT imaging of the head was performed without evidence of acute stroke.  CTA of the head neck was additionally performed.  They have recommended hospitalization for complete stroke work-up.  325 mg of aspirin was administered.  The hospitalist group was then called to assess the patient for admission to the hospital.   Review of Systems:   Review of Systems  Neurological:  Positive for focal weakness.  All other systems reviewed and are negative.  Past Medical History:  Diagnosis Date   CVA (cerebral vascular accident) ([place])    Diabetes mellitus without complication ([place])    Hepatitis C    Schizophrenia ([place])     Past Surgical History:  Procedure Laterality Date   COLONOSCOPY WITH PROPOFOL N/A 08/15/2020   Procedure: COLONOSCOPY WITH PROPOFOL;  Surgeon: Lucilla Lame, MD;  Location: [facility] ENDOSCOPY;  Service: Endoscopy;  Laterality: N/A;   ESOPHAGOGASTRODUODENOSCOPY (EGD) WITH PROPOFOL N/A 10/10/2016   Procedure: ESOPHAGOGASTRODUODENOSCOPY (EGD) WITH PROPOFOL;  Surgeon: Lucilla Lame, MD;  Location: [facility] ENDOSCOPY;  Service: Endoscopy;  Laterality: N/A;   HERNIA REPAIR     VISCERAL ARTERY INTERVENTION N/A 10/11/2016   Procedure: Visceral Artery Intervention;  Surgeon: Algernon Huxley, MD;  Location: [place] CV LAB;  Service: Cardiovascular;  Laterality: N/A;     reports that he has been smoking cigarettes. He has a 2.50 pack-year smoking history. He has never used smokeless tobacco. He reports that he does not currently use alcohol after a past usage of about 10.0 standard drinks per week. He reports that he does not use drugs.  No Known Allergies  Family History  Problem Relation Age of Onset  Diabetes Sister      Prior to Admission medications   Medication Sig Start Date End Date Taking? Authorizing Provider  acetaminophen (TYLENOL) 325 MG tablet Take 2  tablets (650 mg total) by mouth every 4 (four) hours as needed for mild pain (or temp > 37.5 C (99.5 F)). 11/16/20   Angiulli, Lavon Paganini, PA-C  acetaminophen (TYLENOL) 650 MG CR tablet Take 650 mg by mouth every 8 (eight) hours as needed for pain.    [provider]  albuterol (VENTOLIN HFA) 108 (90 Base) MCG/ACT inhaler Inhale 2 puffs into the lungs every 6 (six) hours as needed for wheezing or shortness of breath. 11/16/20   Angiulli, Lavon Paganini, PA-C  amLODipine (NORVASC) 10 MG tablet Take 1 tablet (10 mg total) by mouth daily. 11/16/20   Angiulli, Lavon Paganini, PA-C  atorvastatin (LIPITOR) 20 MG tablet Take 1 tablet (20 mg total) by mouth daily. 11/16/20   Angiulli, Lavon Paganini, PA-C  cyanocobalamin 1000 MCG tablet Take 1 tablet (1,000 mcg total) by mouth daily. 11/16/20   Angiulli, Lavon Paganini, PA-C  Diapers & Supplies MISC 1 Units by Does not apply route every 2 (two) hours as needed. 12/18/20 01/17/21  Naaman Plummer, MD  diclofenac Sodium (VOLTAREN) 1 % GEL Apply 2 g topically 4 (four) times daily. 11/16/20   Angiulli, Lavon Paganini, PA-C  docusate sodium (COLACE) 100 MG capsule Take 1 capsule (100 mg total) by mouth 2 (two) times daily. 12/20/20 12/20/21  Blake Divine, MD  ferrous sulfate 325 (65 FE) MG tablet Take 1 tablet (325 mg total) by mouth daily with breakfast. 11/16/20   Angiulli, Lavon Paganini, PA-C  gabapentin (NEURONTIN) 100 MG capsule Take 1 capsule (100 mg total) by mouth 3 (three) times daily. 11/16/20   Angiulli, Lavon Paganini, PA-C  insulin aspart (NOVOLOG FLEXPEN) 100 UNIT/ML FlexPen Inject 10 Units into the skin 3 (three) times daily with meals. 11/16/20   Angiulli, Lavon Paganini, PA-C  insulin detemir (LEVEMIR FLEXTOUCH) 100 UNIT/ML FlexPen Inject 23 Units into the skin at bedtime. 11/16/20   Angiulli, Lavon Paganini, PA-C  lidocaine (LIDODERM) 5 % Place 1 patch onto the skin daily. Remove & Discard patch within 12 hours or as directed by MD 11/16/20   Angiulli, Lavon Paganini, PA-C  melatonin 3 MG TABS tablet Take 1 tablet (3 mg  total) by mouth at bedtime. 11/16/20   Angiulli, Lavon Paganini, PA-C  Multiple Vitamin (MULTI-VITAMIN) tablet Take 1 tablet by mouth daily.    [provider]  OLANZapine (ZYPREXA) 5 MG tablet Take 1 tablet (5 mg total) by mouth at bedtime. 11/16/20   Angiulli, Lavon Paganini, PA-C  pantoprazole (PROTONIX) 40 MG tablet Take 1 tablet (40 mg total) by mouth at bedtime. 11/16/20   Angiulli, Lavon Paganini, PA-C  polyethylene glycol (MIRALAX / GLYCOLAX) 17 g packet Take 17 g by mouth daily as needed for mild constipation. 06/15/20   Loletha Grayer, MD  polyvinyl alcohol (ARTIFICIAL TEARS) 1.4 % ophthalmic solution Place 1 drop into both eyes as needed for dry eyes. 06/15/20   Loletha Grayer, MD  senna-docusate (SENOKOT-S) 8.6-50 MG tablet Take 1 tablet by mouth 2 (two) times daily. 11/16/20   Angiulli, Lavon Paganini, PA-C  tamsulosin (FLOMAX) 0.4 MG CAPS capsule Take 1 capsule (0.4 mg total) by mouth daily after supper. 11/16/20   Angiulli, Lavon Paganini, PA-C  tiZANidine (ZANAFLEX) 2 MG tablet Take 1 tablet (2 mg total) by mouth 4 (four) times daily -  with meals and at bedtime.  11/16/20   Angiulli, Lavon Paganini, PA-C  traMADol (ULTRAM) 50 MG tablet Take 1 tablet (50 mg total) by mouth every 12 (twelve) hours as needed for moderate pain. 11/16/20   Cathlyn Parsons, PA-C    Physical Exam: Vitals:   02/05/21 0315 02/05/21 0400 02/05/21 0500 02/05/21 0600  BP: (!) 156/87 (!) 144/89 133/85 126/78  Pulse: 84 92 72 68  Resp: (!) 22 (!) '22 17 14  '$ Temp:      TempSrc:      SpO2: 100% 100% 99% 100%  Weight:      Height:        Constitutional: Patient is lethargic arousable and oriented x3, no associated distress.   Skin: no rashes, no lesions, good skin turgor noted. Eyes: Pupils are equally reactive to light.  No evidence of scleral icterus or conjunctival pallor.  ENMT: Moist mucous membranes noted.  Posterior pharynx clear of any exudate or lesions.   Neck: normal, supple, no masses, no thyromegaly.  No evidence of jugular venous  distension.   Respiratory: clear to auscultation bilaterally, no wheezing, no crackles. Normal respiratory effort. No accessory muscle use.  Cardiovascular: Regular rate and rhythm, no murmurs / rubs / gallops. No extremity edema. 2+ pedal pulses. No carotid bruits.  Chest:   Nontender without crepitus or deformity.   Back:   Nontender without crepitus or deformity. Abdomen: Abdomen is soft and nontender.  No evidence of intra-abdominal masses.  Positive bowel sounds noted in all quadrants.   Musculoskeletal: No joint deformity upper and lower extremities. Good ROM, no contractures. Normal muscle tone.  Neurologic: CN 2-12 grossly intact. Sensation intact.  Patient moving all 4 extremities spontaneously.  Patient is following all commands.  Patient is responsive to verbal stimuli.   Psychiatric: Patient exhibits angry mood with flat affect.  Patient seems to possess insight as to their current situation.     Labs on Admission: I have personally reviewed following labs and imaging studies -   CBC: Recent Labs  Lab 02/05/21 0000 02/05/21 0007  WBC 9.9  --   NEUTROABS 7.3  --   HGB 11.1* 10.9*  HCT 35.2* 32.0*  MCV 95.1  --   PLT 206  --    Basic Metabolic Panel: Recent Labs  Lab 02/05/21 0000 02/05/21 0007  NA 141 143  K 3.6 3.6  CL 108 106  CO2 26  --   GLUCOSE 142* 137*  BUN 11 12  CREATININE 1.12 1.10  CALCIUM 8.6*  --    GFR: Estimated Creatinine Clearance: 59.8 mL/min (by C-G formula based on SCr of 1.1 mg/dL). Liver Function Tests: Recent Labs  Lab 02/05/21 0000  AST 11*  ALT 13  ALKPHOS 95  BILITOT 0.6  PROT 6.5  ALBUMIN 3.0*   No results for input(s): LIPASE, AMYLASE in the last 168 hours. No results for input(s): AMMONIA in the last 168 hours. Coagulation Profile: Recent Labs  Lab 02/05/21 0000  INR 1.2   Cardiac Enzymes: No results for input(s): CKTOTAL, CKMB, CKMBINDEX, TROPONINI in the last 168 hours. BNP (last 3 results) No results for  input(s): PROBNP in the last 8760 hours. HbA1C: No results for input(s): HGBA1C in the last 72 hours. CBG: Recent Labs  Lab 02/05/21 0001  GLUCAP 134*   Lipid Profile: No results for input(s): CHOL, HDL, LDLCALC, TRIG, CHOLHDL, LDLDIRECT in the last 72 hours. Thyroid Function Tests: No results for input(s): TSH, T4TOTAL, FREET4, T3FREE, THYROIDAB in the last 72 hours. Anemia Panel:  No results for input(s): VITAMINB12, FOLATE, FERRITIN, TIBC, IRON, RETICCTPCT in the last 72 hours. Urine analysis:    Component Value Date/Time   COLORURINE YELLOW (A) 12/20/2020 1753   APPEARANCEUR CLEAR (A) 12/20/2020 1753   APPEARANCEUR Clear 10/05/2018 1028   LABSPEC >1.046 (H) 12/20/2020 1753   PHURINE 5.0 12/20/2020 1753   GLUCOSEU 50 (A) 12/20/2020 1753   HGBUR MODERATE (A) 12/20/2020 1753   BILIRUBINUR NEGATIVE 12/20/2020 1753   BILIRUBINUR Negative 10/05/2018 1028   KETONESUR 5 (A) 12/20/2020 1753   PROTEINUR 100 (A) 12/20/2020 1753   NITRITE NEGATIVE 12/20/2020 1753   LEUKOCYTESUR NEGATIVE 12/20/2020 1753    Radiological Exams on Admission - Personally Reviewed: CT HEAD CODE STROKE WO CONTRAST  Result Date: 02/05/2021 CLINICAL DATA:  Code stroke.  Acute neurologic deficit EXAM: CT HEAD WITHOUT CONTRAST TECHNIQUE: Contiguous axial images were obtained from the base of the skull through the vertex without intravenous contrast. COMPARISON:  None. FINDINGS: Brain: Unchanged appearance right convexity meningioma. There is generalized atrophy without lobar predilection. There is hypoattenuation of the periventricular white matter, most commonly indicating chronic ischemic microangiopathy. Vascular: No abnormal hyperdensity of the major intracranial arteries or dural venous sinuses. No intracranial atherosclerosis. Skull: The visualized skull base, calvarium and extracranial soft tissues are normal. Sinuses/Orbits: No fluid levels or advanced mucosal thickening of the visualized paranasal sinuses.  No mastoid or middle ear effusion. The orbits are normal. ASPECTS [facility] Stroke Program Early CT Score) - Ganglionic level infarction (caudate, lentiform nuclei, internal capsule, insula, M1-M3 cortex): 7 - Supraganglionic infarction (M4-M6 cortex): 3 Total score (0-10 with 10 being normal): 10 IMPRESSION: 1. No acute intracranial abnormality. 2. ASPECTS is 10. 3. Unchanged appearance of right convexity meningioma. These results were communicated to Dr. Roland Rack at 12:28 am on 02/05/2021 by text page via the [facility] messaging system. Electronically Signed   By: Ulyses Jarred M.D.   On: 02/05/2021 00:28   CT ANGIO HEAD CODE STROKE  Result Date: 02/05/2021 CLINICAL DATA:  Acute neurologic deficit EXAM: CT ANGIOGRAPHY HEAD AND NECK TECHNIQUE: Multidetector CT imaging of the head and neck was performed using the standard protocol during bolus administration of intravenous contrast. Multiplanar CT image reconstructions and MIPs were obtained to evaluate the vascular anatomy. Carotid stenosis measurements (when applicable) are obtained utilizing NASCET criteria, using the distal internal carotid diameter as the denominator. CONTRAST:  30m OMNIPAQUE IOHEXOL 350 MG/ML SOLN COMPARISON:  None. FINDINGS: CTA NECK FINDINGS SKELETON: There is no bony spinal canal stenosis. No lytic or blastic lesion. OTHER NECK: Normal pharynx, larynx and major salivary glands. No cervical lymphadenopathy. Unremarkable thyroid gland. UPPER CHEST: No pneumothorax or pleural effusion. No nodules or masses. AORTIC ARCH: There is calcific atherosclerosis of the aortic arch. There is no aneurysm, dissection or hemodynamically significant stenosis of the visualized portion of the aorta. Conventional 3 vessel aortic branching pattern. The visualized proximal subclavian arteries are widely patent. RIGHT CAROTID SYSTEM: Normal without aneurysm, dissection or stenosis. LEFT CAROTID SYSTEM: Normal without aneurysm, dissection or stenosis.  VERTEBRAL ARTERIES: Left dominant configuration. Both origins are clearly patent. There is no dissection, occlusion or flow-limiting stenosis to the skull base (V1-V3 segments). CTA HEAD FINDINGS POSTERIOR CIRCULATION: --Vertebral arteries: Normal V4 segments. --Inferior cerebellar arteries: Normal. --Basilar artery: Normal. --Superior cerebellar arteries: Normal. --Posterior cerebral arteries (PCA): Normal. ANTERIOR CIRCULATION: --Intracranial internal carotid arteries: Normal. --Anterior cerebral arteries (ACA): Normal. Both A1 segments are present. Patent anterior communicating artery (a-comm). --Middle cerebral arteries (MCA): Normal. VENOUS SINUSES: As permitted by  contrast timing, patent. ANATOMIC VARIANTS: None Review of the MIP images confirms the above findings. IMPRESSION: 1. No emergent large vessel occlusion or hemodynamically significant stenosis. Aortic Atherosclerosis (ICD10-I70.0). Electronically Signed   By: Ulyses Jarred M.D.   On: 02/05/2021 00:41   CT ANGIO NECK CODE STROKE  Result Date: 02/05/2021 CLINICAL DATA:  Acute neurologic deficit EXAM: CT ANGIOGRAPHY HEAD AND NECK TECHNIQUE: Multidetector CT imaging of the head and neck was performed using the standard protocol during bolus administration of intravenous contrast. Multiplanar CT image reconstructions and MIPs were obtained to evaluate the vascular anatomy. Carotid stenosis measurements (when applicable) are obtained utilizing NASCET criteria, using the distal internal carotid diameter as the denominator. CONTRAST:  46m OMNIPAQUE IOHEXOL 350 MG/ML SOLN COMPARISON:  None. FINDINGS: CTA NECK FINDINGS SKELETON: There is no bony spinal canal stenosis. No lytic or blastic lesion. OTHER NECK: Normal pharynx, larynx and major salivary glands. No cervical lymphadenopathy. Unremarkable thyroid gland. UPPER CHEST: No pneumothorax or pleural effusion. No nodules or masses. AORTIC ARCH: There is calcific atherosclerosis of the aortic arch. There  is no aneurysm, dissection or hemodynamically significant stenosis of the visualized portion of the aorta. Conventional 3 vessel aortic branching pattern. The visualized proximal subclavian arteries are widely patent. RIGHT CAROTID SYSTEM: Normal without aneurysm, dissection or stenosis. LEFT CAROTID SYSTEM: Normal without aneurysm, dissection or stenosis. VERTEBRAL ARTERIES: Left dominant configuration. Both origins are clearly patent. There is no dissection, occlusion or flow-limiting stenosis to the skull base (V1-V3 segments). CTA HEAD FINDINGS POSTERIOR CIRCULATION: --Vertebral arteries: Normal V4 segments. --Inferior cerebellar arteries: Normal. --Basilar artery: Normal. --Superior cerebellar arteries: Normal. --Posterior cerebral arteries (PCA): Normal. ANTERIOR CIRCULATION: --Intracranial internal carotid arteries: Normal. --Anterior cerebral arteries (ACA): Normal. Both A1 segments are present. Patent anterior communicating artery (a-comm). --Middle cerebral arteries (MCA): Normal. VENOUS SINUSES: As permitted by contrast timing, patent. ANATOMIC VARIANTS: None Review of the MIP images confirms the above findings. IMPRESSION: 1. No emergent large vessel occlusion or hemodynamically significant stenosis. Aortic Atherosclerosis (ICD10-I70.0). Electronically Signed   By: KUlyses JarredM.D.   On: 02/05/2021 00:41    EKG: Personally reviewed.  Rhythm is normal sinus rhythm with heart rate of 73 bpm.  No dynamic ST segment changes appreciated.  Assessment/Plan Principal Problem: Right facial and right-sided weakness   Patient presenting with a vague several day history of right facial and right-sided weakness I cannot seem to get the patient to express right-sided weakness on examination, perhaps this has resolved? There are, right facial weakness is relatively subtle and the eyes not spared bring about concern for possible Bell's palsy Initial noncontrast CT imaging of the head is  unremarkable Awaiting CT angiogram of the head and neck as well as MRI brain per neurology recommendations Continue patient on daily aspirin 325 mg Obtaining lipid panel Serial neurologic checks Monitoring patient on telemetry PT, OT, SLP evaluation Echocardiogram Patient for now until stroke is ruled out  Active Problems:   Hepatitis C virus infection without hepatic coma  Outpatient follow-up    [place]  Continue [place] regimen of Zyprexa    Type 2 diabetes mellitus with hyperglycemia, without long-term current use of insulin (HCC)  Continue home regimen of basal bolus insulin therapy Obtaining hemoglobin A1c Accu-Cheks before every meal and nightly with sliding scale insulin    GERD without esophagitis  Daily PPI per home regimen    BPH (benign prostatic hyperplasia)  Continue home regimen of Flomax    [place]ronic diastolic [place]ive [place]  No evidence of cardiogenic volume overload     Code Status:  Full code Family Communication: deferred   Status is: Observation  The patient remains OBS appropriate and will d/c before 2 midnights.  Dispo: The patient is from: Home              Anticipated d/c is to: Home              Patient currently is not medically stable to d/c.   Difficult to place patient No        Vernelle Emerald MD [facility] Pager [phone]  If 7PM-7AM, please contact night-coverage [email or web address] Use universal [place] password for that web site. If you do not have the password, please call the hospital operator.  02/05/2021, 6:58 AM

## 2021-02-05 NOTE — Plan of Care (Signed)
Neurology Plan of Care  Clinical exam c/w R Bell's Palsy. MRI brain revealed no acute stroke, but did reveal mild asymmetric enhancement of the R IAC fundus suggestive of Bell's palsy in this clinical context (CNS imaging personally reviewed).  Recommendations: prednisone '60mg'$  daily x7 days no taper - valacyclovir '1000mg'$  daily x7 days - I will arrange neuro outpatient f/u in 6 weeks  No further inpatient neurologic workup indicated. Neuro will sign off, but please re-engage if new neurologic concerns arise.  Su Monks, MD [facility] [phone]  If 7pm- 7am, please page neurology on call as listed in [place].

## 2021-02-06 DIAGNOSIS — G510 Bell's palsy: Secondary | ICD-10-CM | POA: Diagnosis not present

## 2021-02-06 DIAGNOSIS — E1165 Type 2 diabetes mellitus with hyperglycemia: Secondary | ICD-10-CM | POA: Diagnosis not present

## 2021-02-06 LAB — GLUCOSE, CAPILLARY
Glucose-Capillary: 187 mg/dL — ABNORMAL HIGH (ref 70–99)
Glucose-Capillary: 140 mg/dL — ABNORMAL HIGH (ref 70–99)
Glucose-Capillary: 428 mg/dL — ABNORMAL HIGH (ref 70–99)
Glucose-Capillary: 391 mg/dL — ABNORMAL HIGH (ref 70–99)

## 2021-02-06 MED ORDER — POLYVINYL ALCOHOL 1.4 % OP SOLN
1.0000 [drp] | OPHTHALMIC | Status: DC | PRN
  Filled 2021-02-06: qty 15

## 2021-02-06 MED ORDER — INSULIN ASPART 100 UNIT/ML IJ SOLN
12.0000 [IU] | Freq: Once | INTRAMUSCULAR | Status: AC
  Administered 2021-02-06: 12 [IU] via SUBCUTANEOUS

## 2021-02-06 NOTE — Evaluation (Signed)
Speech Language Pathology Evaluation Patient Details Name: Lance [name] MRN: [id number] DOB: January 19, 1946 Today's Date: 02/06/2021 Time: [id number] SLP Time Calculation (min) (ACUTE ONLY): 35 min  Problem List:  Patient Active Problem List   Diagnosis Date Noted   Right sided weakness 02/05/2021   BPH (benign prostatic hyperplasia) 02/05/2021   Chronic diastolic CHF (congestive heart failure) ([place]) 02/05/2021   Controlled type 2 diabetes mellitus with hyperglycemia [facility])    Essential hypertension    Vascular headache    Spastic hemiparesis ([place])    Intraparenchymal hemorrhage of brain ([place]) 10/19/2020   Left hemiplegia ([place]) 10/19/2020   ICH (intracerebral hemorrhage) ([place]) 10/14/2020   Weakness    Acute metabolic encephalopathy    Silent micro-hemorrhage of brain ([place])    Functional assessment declined    Malnutrition of moderate degree 05/26/2020   Subacute delirium 05/16/2020   Syncope 05/15/2020   Sepsis ([place]) 05/14/2020   Leg pain 12/07/2018   PAD (peripheral artery disease) ([place]) 12/07/2018   Acute upper GI bleed 10/10/2016   Hypotension    Polysubstance abuse ([place]) 03/10/2015   Mass of arm 03/10/2015   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 03/10/2015   Perianal abscess 08/31/2014   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 ([place]) 07/07/2014   Adenomatous polyp of colon 08/30/2013   Type 2 diabetes mellitus with hyperglycemia, without long-term current use of insulin ([place]) 08/26/2013   Hepatitis C virus infection without hepatic coma 03/21/2013   Need for hepatitis B vaccination 03/21/2013   Depression 10/23/2012   IVDU (intravenous drug user) 10/23/2012   Schizophrenia ([place]) 10/23/2012   Carpal tunnel syndrome on both sides 07/31/2012   Diabetic peripheral neuropathy ([place]) 07/31/2012   Osteoarthritis of both hips 07/31/2012   GERD without esophagitis 10/21/2009   Diverticulitis of colon 10/21/2009   Past Medical History:  Past Medical History:  Diagnosis Date   CVA (cerebral vascular  accident) ([place])    Diabetes mellitus without complication ([place])    Hepatitis C    Schizophrenia ([place])    Past Surgical History:  Past Surgical History:  Procedure Laterality Date   COLONOSCOPY WITH PROPOFOL N/A 08/15/2020   Procedure: COLONOSCOPY WITH PROPOFOL;  Surgeon: Lucilla Lame, MD;  [facility] ENDOSCOPY;  Service: Endoscopy;  Laterality: N/A;   ESOPHAGOGASTRODUODENOSCOPY (EGD) WITH PROPOFOL N/A 10/10/2016   Procedure: ESOPHAGOGASTRODUODENOSCOPY (EGD) WITH PROPOFOL;  Surgeon: Lucilla Lame, MD;  Location: [facility] ENDOSCOPY;  Service: Endoscopy;  Laterality: N/A;   HERNIA REPAIR     VISCERAL ARTERY INTERVENTION N/A 10/11/2016   Procedure: Visceral Artery Intervention;  Surgeon: Algernon Huxley, MD;  Location: [facility];  Service: Cardiovascular;  Laterality: N/A;   HPI:  75 year old male with past medical history of insulin-dependent diabetes mellitus type 2, previous stroke, hepatitis C, schizophrenia, recent diagnosis of right frontal intraparenchymal hemorrhage in May [id number], diastolic congestive heart failure ([place] 10/2020 EF 70-75% with G1DD), meningioma, hyperlipidemia, peripheral artery disease, cocaine abuse, gastroesophageal reflux disease and benign prostatic hyperplasia who presents to [facility] emergency department for evaluation via EMS due to development of right-sided weakness.   Assessment / Plan / Recommendation Clinical Impression  Pt was seen for speech language evaluation as part of stroke work-up. Pt reports his speech is 'blurry' compared to baseline. SLP conducted oral mechanism exam which revealed right facial droop and buccal and labial right weakness. ROM, symmetry, and strength of tongue unaffected. Pt presents with mild flaccid dysarthria c/b imprecise articulation. SLP unable to establish cognitive baseline d/t no family present. Overall, cognition impaired  but intact to perform basic functions. Pt is oriented x4. SLP administered Cognistat evaluation.  Pt shows no functional deficits for immediate recall, orientation, conversation, repetition, object naming, and judgement. Cognitive deficits apparent in categories of short term memory, object categorization, following multi-step directions, and mathematical equations. Pt is cognitively functional for current venue of care but will need continued ST at next venue to target higher level cognitive functions. Recommend SLP f/u for compensatory strategy training and caregiver education re: dysarthria.    SLP Assessment  SLP Recommendation/Assessment: Patient needs continued Speech Lanaguage Pathology Services SLP Visit Diagnosis: Cognitive communication deficit (R41.841);Dysarthria and anarthria (R47.1)    Follow Up Recommendations  Skilled Nursing facility;24 hour supervision/assistance    Frequency and Duration min 2x/week  2 weeks      SLP Evaluation Cognition  Overall Cognitive Status: No family/caregiver present to determine baseline cognitive functioning Arousal/Alertness: Awake/alert Orientation Level: Oriented X4 Year: 2022 Month: August Day of Week: Incorrect Attention: Sustained Sustained Attention: Appears intact Memory: Impaired Memory Impairment: Storage deficit;Retrieval deficit Awareness: Appears intact Problem Solving: Appears intact Safety/Judgment: Appears intact       Comprehension  Auditory Comprehension Overall Auditory Comprehension: Appears within functional limits for tasks assessed Yes/No Questions: Within Functional Limits Commands: Impaired Multistep Basic Commands: 25-49% accurate Conversation: Simple Visual Recognition/Discrimination Discrimination: Within Function Limits Reading Comprehension Reading Status: Not tested    Expression Expression Primary Mode of Expression: Verbal Verbal Expression Overall Verbal Expression: Appears within functional limits for tasks assessed Initiation: No impairment Level of Generative/Spontaneous  Verbalization: Sentence;Conversation;Phrase;Word Repetition: No impairment Naming: No impairment Pragmatics: No impairment Written Expression Dominant Hand: Right Written Expression: Not tested   Oral / Motor  Oral Motor/Sensory Function Overall Oral Motor/Sensory Function: Mild impairment Facial ROM: Reduced right;Suspected CN VII (facial) dysfunction Facial Symmetry: Abnormal symmetry right;Suspected CN VII (facial) dysfunction Facial Strength: Reduced right Lingual ROM: Within Functional Limits Lingual Symmetry: Within Functional Limits Lingual Strength: Within Functional Limits Lingual Sensation: Within Functional Limits Velum: Within Functional Limits Motor Speech Overall Motor Speech: Impaired Respiration: Within functional limits Phonation: Normal Resonance: Within functional limits Articulation: Impaired Level of Impairment: Word Intelligibility: Intelligibility reduced Word: 75-100% accurate Phrase: 75-100% accurate Sentence: 75-100% accurate Conversation: 75-100% accurate Motor Planning: Witnin functional limits Motor Speech Errors: Aware Effective Techniques: Over-articulate   GO          Functional Assessment Tool Used: Cognistat       Dewitt Rota, SLP-Student    Dewitt Rota 02/06/2021, 3:37 PM

## 2021-02-06 NOTE — Plan of Care (Signed)

## 2021-02-06 NOTE — Care Management Obs Status (Signed)
[place] NOTIFICATION   Patient Details  Name: WILIAN EBANKS MRN: [id number] Date of Birth: Dec 31, 1945   Medicare Observation Status Notification Given:  Yes    Geralynn Ochs, LCSW 02/06/2021, 4:08 PM

## 2021-02-06 NOTE — Progress Notes (Signed)
[place] contacted CSW to let the hospital know they are calling to do a peer to peer for SNF placement. CSW provided Navi with the CSW on patients floor.

## 2021-02-06 NOTE — Progress Notes (Signed)
Patient arrived in the unit at 2200 from [facility], no s/s destress, A&Ox3,,initiated telemetry monitor, and will continue to monitor.

## 2021-02-06 NOTE — Progress Notes (Signed)
PROGRESS NOTE    Lance [name]  [id number] DOB: 1946-02-14 DOA: 02/04/2021 PCP: Theotis Burrow, MD   Brief Narrative: 75 year old with past medical history significant for insulin-dependent diabetes type 2, previous stroke, hepatitis C, schizophrenia, recent diagnosis of right frontal intraparenchymal hemorrhage in May [id number], diastolic heart failure meningioma, hyperlipidemia, peripheral artery disease, cocaine abuse, GERD, BPH, presents to ED for evaluation of right side weakness.  Patient developed right facial droop between Thursday and Saturday.  His  facial droop persisted for 2 days and he also developed associated right-sided weakness and difficulty with performing ADLs.   Patient was evaluated by neurology, they could not identify any actual extremity weakness but only right facial weakness.  Since CT head without acute stroke.  Patient was admitted for further evaluation and stroke work-up.  After further evaluation patient was diagnosed with right side Bell's palsy   Assessment & Plan:   Principal Problem:   Right sided weakness Active Problems:   Hepatitis C virus infection without hepatic coma   Schizophrenia (HCC)   Type 2 diabetes mellitus with hyperglycemia, without long-term current use of insulin (HCC)   GERD without esophagitis   BPH (benign prostatic hyperplasia)   Chronic diastolic CHF (congestive heart failure) (HCC)  1-Right side Bell's palsy: -MRI: Mild asymmetric enhancement of the right IAC fundus suggestive of Bell's palsy given the history.  No acute intracranial hemorrhage or infarct.  Stable right frontal meningioma.  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 -Discussed with neurology plan for prednisone and acyclovir for 7 days.  No need for prednisone taper.  Okay to discontinue aspirin.  2-History of hepatitis C without coma Follow up As an  outpatient.  3-History of schizophrenia: Continue with Zyprexa.   4-Type 2 diabetes with hyperglycemia: A1C:8.6 On Levemir 23 units daily. 10 units novolog meal coverage.  SSI  5-GERD: Continue with PPI  6-BPH: Continue with Flomax  7-Chronic diastolic heart failure: Compensated  8-HTN; Resume Norvasc.   Estimated body mass index is 25.84 kg/m as calculated from the following:   Height as of this encounter: '5\' 9"'$  (1.753 m).   Weight as of this encounter: 79.4 kg.   DVT prophylaxis: Lovenox Code Status: Full Code Family Communication: Daughter over phone 8/30. Disposition Plan:  Status is: Observation  The patient remains OBS appropriate and will d/c before 2 midnights.  Dispo: The patient is from: Home              Anticipated d/c is to: SNF              Patient currently is medically stable to d/c.   Difficult to place patient No        Consultants:  Neurology   Procedures:    Antimicrobials:    Subjective: He is complaining of lacrimation.  Denies pain.  He needs assistance ordering food.   Objective: Vitals:   02/06/21 0115 02/06/21 0454 02/06/21 0813 02/06/21 1209  BP: 138/80 137/74 (!) 156/96 (!) 139/93  Pulse: 81 68 71 69  Resp: '20 16 18 14  '$ Temp: 97.8 F (36.6 C) (!) 97.4 F (36.3 C) 98.1 F (36.7 C) 97.9 F (36.6 C)  TempSrc: Oral Oral Oral Oral  SpO2: 100% 100% 100% 100%  Weight:      Height:        Intake/Output Summary (Last 24 hours) at 02/06/2021 1355 Last data filed at 02/06/2021 1300 Gross per 24 hour  Intake 940 ml  Output 1450 ml  Net -510 ml   Filed Weights   02/05/21 0133  Weight: 79.4 kg    Examination:  General exam: NAD Respiratory system: CTA. Cardiovascular system: S 1, S 2 RRR Gastrointestinal system: BS present, soft, nt Central nervous system: Alert and oriented. Right side facial weakness, left side weakness old Extremities: Left side weakness   Data Reviewed: I have personally reviewed following  labs and imaging studies  CBC: Recent Labs  Lab 02/05/21 0000 02/05/21 0007  WBC 9.9  --   NEUTROABS 7.3  --   HGB 11.1* 10.9*  HCT 35.2* 32.0*  MCV 95.1  --   PLT 206  --     Basic Metabolic Panel: Recent Labs  Lab 02/05/21 0000 02/05/21 0007  NA 141 143  K 3.6 3.6  CL 108 106  CO2 26  --   GLUCOSE 142* 137*  BUN 11 12  CREATININE 1.12 1.10  CALCIUM 8.6*  --     GFR: Estimated Creatinine Clearance: 59.8 mL/min (by C-G formula based on SCr of 1.1 mg/dL). Liver Function Tests: Recent Labs  Lab 02/05/21 0000  AST 11*  ALT 13  ALKPHOS 95  BILITOT 0.6  PROT 6.5  ALBUMIN 3.0*    No results for input(s): LIPASE, AMYLASE in the last 168 hours. No results for input(s): AMMONIA in the last 168 hours. Coagulation Profile: Recent Labs  Lab 02/05/21 0000  INR 1.2    Cardiac Enzymes: No results for input(s): CKTOTAL, CKMB, CKMBINDEX, TROPONINI in the last 168 hours. BNP (last 3 results) No results for input(s): PROBNP in the last 8760 hours. HbA1C: Recent Labs    02/05/21 0000  HGBA1C 8.6*    CBG: Recent Labs  Lab 02/05/21 1258 02/05/21 1747 02/05/21 2228 02/06/21 0828 02/06/21 1208  GLUCAP 95 307* 306* 187* 140*    Lipid Profile: No results for input(s): CHOL, HDL, LDLCALC, TRIG, CHOLHDL, LDLDIRECT in the last 72 hours. Thyroid Function Tests: No results for input(s): TSH, T4TOTAL, FREET4, T3FREE, THYROIDAB in the last 72 hours. Anemia Panel: No results for input(s): VITAMINB12, FOLATE, FERRITIN, TIBC, IRON, RETICCTPCT in the last 72 hours. Sepsis Labs: No results for input(s): PROCALCITON, LATICACIDVEN in the last 168 hours.  Recent Results (from the past 240 hour(s))  Resp Panel by RT-PCR (Flu A&B, Covid) Nasopharyngeal Swab     Status: None   Collection Time: 02/05/21 12:01 AM   Specimen: Nasopharyngeal Swab; Nasopharyngeal(NP) swabs in vial transport medium  Result Value Ref Range Status   SARS Coronavirus 2 by RT PCR NEGATIVE NEGATIVE  Final    Comment: (NOTE) SARS-CoV-2 target nucleic acids are NOT DETECTED.  The SARS-CoV-2 RNA is generally detectable in upper respiratory specimens during the acute phase of infection. The lowest concentration of SARS-CoV-2 viral copies this assay can detect is 138 copies/mL. A negative result does not preclude SARS-Cov-2 infection and should not be used as the sole basis for treatment or other patient management decisions. A negative result may occur with  improper specimen collection/handling, submission of specimen other than nasopharyngeal swab, presence of viral mutation(s) within the areas targeted by this assay, and inadequate number of viral copies(<138 copies/mL). A negative result must be combined with clinical observations, patient history, and epidemiological information. The expected result is Negative.  Fact Sheet for Patients:  EntrepreneurPulse.com.au  Fact Sheet for Healthcare Providers:  IncredibleEmployment.be  This test is no t yet approved or cleared by the [place] FDA and  has  been authorized for detection and/or diagnosis of SARS-CoV-2 by FDA under an Emergency Use Authorization (EUA). This EUA will remain  in effect (meaning this test can be used) for the duration of the COVID-19 declaration under Section 564(b)(1) of the Act, 21 U.S.C.section 360bbb-3(b)(1), unless the authorization is terminated  or revoked sooner.       Influenza A by PCR NEGATIVE NEGATIVE Final   Influenza B by PCR NEGATIVE NEGATIVE Final    Comment: (NOTE) The Xpert Xpress SARS-CoV-2/FLU/RSV plus assay is intended as an aid in the diagnosis of influenza from Nasopharyngeal swab specimens and should not be used as a sole basis for treatment. Nasal washings and aspirates are unacceptable for Xpert Xpress SARS-CoV-2/FLU/RSV testing.  Fact Sheet for Patients: EntrepreneurPulse.com.au  Fact Sheet for Healthcare  Providers: IncredibleEmployment.be  This test is not yet approved or cleared by the [place] FDA and has been authorized for detection and/or diagnosis of SARS-CoV-2 by FDA under an Emergency Use Authorization (EUA). This EUA will remain in effect (meaning this test can be used) for the duration of the COVID-19 declaration under Section 564(b)(1) of the Act, 21 U.S.C. section 360bbb-3(b)(1), unless the authorization is terminated or revoked.  Performed at [facility], [place] [name] [place], [place] [address]           Radiology Studies: MR BRAIN W WO CONTRAST  Result Date: 02/05/2021 CLINICAL DATA:  Evaluate for Bell's palsy versus stroke EXAM: MRI HEAD WITHOUT AND WITH CONTRAST TECHNIQUE: Multiplanar, multiecho pulse sequences of the brain and surrounding structures were obtained without and with intravenous contrast. CONTRAST:  12m GADAVIST GADOBUTROL 1 MMOL/ML IV SOLN COMPARISON:  Same day CT/CTA head, brain MRI 10/14/2020 CT head 05/16/2020 FINDINGS: Brain: There is no evidence of acute intracranial hemorrhage, extra-axial fluid collection, or infarct. There is a homogeneously enhancing extra-axial lesion over the right frontal convexity measuring up to 5.9 cm AP in the sagittal plane consistent with a meningioma, unchanged. The lesion again exerts mass effect on the underlying frontal lobe. There is no midline shift. Extensive FLAIR signal abnormality throughout the subcortical and periventricular white matter is not significantly changed, likely reflecting sequela of advanced chronic white matter microangiopathy. The ventricles are stable in size. A prior intraparenchymal hemorrhage in the right superior frontal gyrus is again seen. Faint curvilinear enhancement at the periphery of the prior hemorrhage site is nonspecific. Extensive chronic microhemorrhages are again seen throughout the cerebral hemispheres concerning for underlying cerebral amyloid  angiopathy. There is mild asymmetric enhancement at the fundus of the right IAC (18-15). There is no other abnormal enhancement. Vascular: Normal flow voids. Skull and upper cervical spine: Normal marrow signal. Sinuses/Orbits: There is mild mucosal thickening in the ethmoid air cells. The globes and orbits are unremarkable. Other: None. IMPRESSION: 1. Mild asymmetric enhancement of the right IAC fundus suggestive of Bell's palsy given the history. 2. No acute intracranial hemorrhage or infarct. 3. Stable right frontal meningioma. 4. 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 5. Advanced chronic white matter microangiopathy, unchanged. Electronically Signed   By: PValetta MoleM.D.   On: 02/05/2021 08:54   ECHOCARDIOGRAM COMPLETE BUBBLE STUDY  Result Date: 02/05/2021    ECHOCARDIOGRAM REPORT   Patient Name:   Lance KOLLERDate of Exam: 02/05/2021 Medical Rec #:  [id number]       Height:       69.0 in Accession #:  [id number]       Weight:       175.0 lb Date of Birth:  [birth year]        BSA:          1.952 m Patient Age:    56 years         BP:           138/79 mmHg Patient Gender: M                HR:           68 bpm. Exam Location:  Inpatient Procedure: 2D Echo, Cardiac Doppler and Color Doppler Indications:    CVA  History:        Patient has prior history of Echocardiogram examinations, most                 recent 10/15/2020. CHF, Signs/Symptoms:Hypotension; Risk                 Factors:Diabetes.  Sonographer:    [place] Referring Phys: [id number] [place]  1. Left ventricular ejection fraction, by estimation, is 65 to 70%. The left ventricle has hyperdynamic function. The left ventricle has no regional wall motion abnormalities. There is mild left ventricular hypertrophy. Left ventricular diastolic parameters are consistent with Grade I diastolic dysfunction (impaired relaxation).  2.  Right ventricular systolic function is normal. The right ventricular size is normal. Tricuspid regurgitation signal is inadequate for assessing PA pressure.  3. The mitral valve is normal in structure. No evidence of mitral valve regurgitation. No evidence of mitral stenosis.  4. The aortic valve is tricuspid. Aortic valve regurgitation is not visualized. Mild aortic valve sclerosis is present, with no evidence of aortic valve stenosis.  5. The inferior vena cava is normal in size with greater than 50% respiratory variability, suggesting right atrial pressure of 3 mmHg. FINDINGS  Left Ventricle: Left ventricular ejection fraction, by estimation, is 65 to 70%. The left ventricle has hyperdynamic function. The left ventricle has no regional wall motion abnormalities. The left ventricular internal cavity size was normal in size. There is mild left ventricular hypertrophy. Left ventricular diastolic parameters are consistent with Grade I diastolic dysfunction (impaired relaxation). Right Ventricle: The right ventricular size is normal. No increase in right ventricular wall thickness. Right ventricular systolic function is normal. Tricuspid regurgitation signal is inadequate for assessing PA pressure. Left Atrium: Left atrial size was normal in size. Right Atrium: Right atrial size was normal in size. Pericardium: Trivial pericardial effusion is present. Mitral Valve: The mitral valve is normal in structure. There is mild calcification of the mitral valve leaflet(s). Mild mitral annular calcification. No evidence of mitral valve regurgitation. No evidence of mitral valve stenosis. Tricuspid Valve: The tricuspid valve is normal in structure. Tricuspid valve regurgitation is not demonstrated. Aortic Valve: The aortic valve is tricuspid. Aortic valve regurgitation is not visualized. Mild aortic valve sclerosis is present, with no evidence of aortic valve stenosis. Aortic valve mean gradient measures 4.0 mmHg. Aortic valve  peak gradient measures 7.0 mmHg. Aortic valve area, by VTI measures 4.41 cm. Pulmonic Valve: The pulmonic valve was normal in structure. Pulmonic valve regurgitation is trivial. Aorta: The aortic root is normal in size and structure. Venous: The inferior vena cava is normal in size with greater than 50% respiratory variability, suggesting right atrial pressure of 3 mmHg. IAS/Shunts: No atrial level shunt detected by color flow Doppler.  LEFT VENTRICLE PLAX 2D LVIDd:  3.70 cm     Diastology LVIDs:         2.50 cm     LV e' medial:    6.58 cm/s LV PW:         1.40 cm     LV E/e' medial:  8.7 LV IVS:        1.80 cm     LV e' lateral:   7.94 cm/s LVOT diam:     2.50 cm     LV E/e' lateral: 7.2 LV SV:         127 LV SV Index:   65 LVOT Area:     4.91 cm  LV Volumes (MOD) LV vol d, MOD A4C: 49.5 ml LV vol s, MOD A4C: 15.8 ml LV SV MOD A4C:     49.5 ml LEFT ATRIUM             Index       RIGHT ATRIUM           Index LA diam:        3.20 cm 1.64 cm/m  RA Area:     12.30 cm LA Vol (A2C):   32.2 ml 16.50 ml/m RA Volume:   24.60 ml  12.60 ml/m LA Vol (A4C):   31.5 ml 16.14 ml/m LA Biplane Vol: 33.0 ml 16.91 ml/m  AORTIC VALVE AV Area (Vmax):    4.31 cm AV Area (Vmean):   3.89 cm AV Area (VTI):     4.41 cm AV Vmax:           132.00 cm/s AV Vmean:          101.000 cm/s AV VTI:            0.287 m AV Peak Grad:      7.0 mmHg AV Mean Grad:      4.0 mmHg LVOT Vmax:         116.00 cm/s LVOT Vmean:        80.100 cm/s LVOT VTI:          0.258 m LVOT/AV VTI ratio: 0.90  AORTA Ao Root diam: 3.60 cm Ao Asc diam:  3.60 cm MITRAL VALVE MV Area (PHT): 2.89 cm    SHUNTS MV E velocity: 57.00 cm/s  Systemic VTI:  0.26 m MV A velocity: 83.10 cm/s  Systemic Diam: 2.50 cm MV E/A ratio:  0.69 Dalton McleanMD Electronically signed by Franki Monte Signature Date/Time: 02/05/2021/3:15:04 PM    Final    CT HEAD CODE STROKE WO CONTRAST  Result Date: 02/05/2021 CLINICAL DATA:  Code stroke.  Acute neurologic deficit EXAM: CT HEAD  WITHOUT CONTRAST TECHNIQUE: Contiguous axial images were obtained from the base of the skull through the vertex without intravenous contrast. COMPARISON:  None. FINDINGS: Brain: Unchanged appearance right convexity meningioma. There is generalized atrophy without lobar predilection. There is hypoattenuation of the periventricular white matter, most commonly indicating chronic ischemic microangiopathy. Vascular: No abnormal hyperdensity of the major intracranial arteries or dural venous sinuses. No intracranial atherosclerosis. Skull: The visualized skull base, calvarium and extracranial soft tissues are normal. Sinuses/Orbits: No fluid levels or advanced mucosal thickening of the visualized paranasal sinuses. No mastoid or middle ear effusion. The orbits are normal. ASPECTS [facility] Stroke Program Early CT Score) - Ganglionic level infarction (caudate, lentiform nuclei, internal capsule, insula, M1-M3 cortex): 7 - Supraganglionic infarction (M4-M6 cortex): 3 Total score (0-10 with 10 being normal): 10 IMPRESSION: 1. No acute intracranial abnormality. 2. ASPECTS is 10. 3. Unchanged  appearance of right convexity meningioma. These results were communicated to Dr. Roland Rack at 12:28 am on 02/05/2021 by text page via the [facility] messaging system. Electronically Signed   By: Ulyses Jarred M.D.   On: 02/05/2021 00:28   CT ANGIO HEAD CODE STROKE  Result Date: 02/05/2021 CLINICAL DATA:  Acute neurologic deficit EXAM: CT ANGIOGRAPHY HEAD AND NECK TECHNIQUE: Multidetector CT imaging of the head and neck was performed using the standard protocol during bolus administration of intravenous contrast. Multiplanar CT image reconstructions and MIPs were obtained to evaluate the vascular anatomy. Carotid stenosis measurements (when applicable) are obtained utilizing NASCET criteria, using the distal internal carotid diameter as the denominator. CONTRAST:  1m OMNIPAQUE IOHEXOL 350 MG/ML SOLN COMPARISON:  None. FINDINGS:  CTA NECK FINDINGS SKELETON: There is no bony spinal canal stenosis. No lytic or blastic lesion. OTHER NECK: Normal pharynx, larynx and major salivary glands. No cervical lymphadenopathy. Unremarkable thyroid gland. UPPER CHEST: No pneumothorax or pleural effusion. No nodules or masses. AORTIC ARCH: There is calcific atherosclerosis of the aortic arch. There is no aneurysm, dissection or hemodynamically significant stenosis of the visualized portion of the aorta. Conventional 3 vessel aortic branching pattern. The visualized proximal subclavian arteries are widely patent. RIGHT CAROTID SYSTEM: Normal without aneurysm, dissection or stenosis. LEFT CAROTID SYSTEM: Normal without aneurysm, dissection or stenosis. VERTEBRAL ARTERIES: Left dominant configuration. Both origins are clearly patent. There is no dissection, occlusion or flow-limiting stenosis to the skull base (V1-V3 segments). CTA HEAD FINDINGS POSTERIOR CIRCULATION: --Vertebral arteries: Normal V4 segments. --Inferior cerebellar arteries: Normal. --Basilar artery: Normal. --Superior cerebellar arteries: Normal. --Posterior cerebral arteries (PCA): Normal. ANTERIOR CIRCULATION: --Intracranial internal carotid arteries: Normal. --Anterior cerebral arteries (ACA): Normal. Both A1 segments are present. Patent anterior communicating artery (a-comm). --Middle cerebral arteries (MCA): Normal. VENOUS SINUSES: As permitted by contrast timing, patent. ANATOMIC VARIANTS: None Review of the MIP images confirms the above findings. IMPRESSION: 1. No emergent large vessel occlusion or hemodynamically significant stenosis. Aortic Atherosclerosis (ICD10-I70.0). Electronically Signed   By: KUlyses JarredM.D.   On: 02/05/2021 00:41   CT ANGIO NECK CODE STROKE  Result Date: 02/05/2021 CLINICAL DATA:  Acute neurologic deficit EXAM: CT ANGIOGRAPHY HEAD AND NECK TECHNIQUE: Multidetector CT imaging of the head and neck was performed using the standard protocol during bolus  administration of intravenous contrast. Multiplanar CT image reconstructions and MIPs were obtained to evaluate the vascular anatomy. Carotid stenosis measurements (when applicable) are obtained utilizing NASCET criteria, using the distal internal carotid diameter as the denominator. CONTRAST:  750mOMNIPAQUE IOHEXOL 350 MG/ML SOLN COMPARISON:  None. FINDINGS: CTA NECK FINDINGS SKELETON: There is no bony spinal canal stenosis. No lytic or blastic lesion. OTHER NECK: Normal pharynx, larynx and major salivary glands. No cervical lymphadenopathy. Unremarkable thyroid gland. UPPER CHEST: No pneumothorax or pleural effusion. No nodules or masses. AORTIC ARCH: There is calcific atherosclerosis of the aortic arch. There is no aneurysm, dissection or hemodynamically significant stenosis of the visualized portion of the aorta. Conventional 3 vessel aortic branching pattern. The visualized proximal subclavian arteries are widely patent. RIGHT CAROTID SYSTEM: Normal without aneurysm, dissection or stenosis. LEFT CAROTID SYSTEM: Normal without aneurysm, dissection or stenosis. VERTEBRAL ARTERIES: Left dominant configuration. Both origins are clearly patent. There is no dissection, occlusion or flow-limiting stenosis to the skull base (V1-V3 segments). CTA HEAD FINDINGS POSTERIOR CIRCULATION: --Vertebral arteries: Normal V4 segments. --Inferior cerebellar arteries: Normal. --Basilar artery: Normal. --Superior cerebellar arteries: Normal. --Posterior cerebral arteries (PCA): Normal. ANTERIOR CIRCULATION: --Intracranial internal carotid arteries: Normal. --  Anterior cerebral arteries (ACA): Normal. Both A1 segments are present. Patent anterior communicating artery (a-comm). --Middle cerebral arteries (MCA): Normal. VENOUS SINUSES: As permitted by contrast timing, patent. ANATOMIC VARIANTS: None Review of the MIP images confirms the above findings. IMPRESSION: 1. No emergent large vessel occlusion or hemodynamically significant  stenosis. Aortic Atherosclerosis (ICD10-I70.0). Electronically Signed   By: Ulyses Jarred M.D.   On: 02/05/2021 00:41        Scheduled Meds:  amLODipine  10 mg Oral Daily   atorvastatin  20 mg Oral Daily   busPIRone  5 mg Oral BID   citalopram  20 mg Oral Daily   docusate sodium  100 mg Oral BID   enoxaparin (LOVENOX) injection  40 mg Subcutaneous Daily   gabapentin  100 mg Oral TID   insulin aspart  0-15 Units Subcutaneous TID AC & HS   insulin aspart  10 Units Subcutaneous TID WC   insulin detemir  23 Units Subcutaneous QHS   melatonin  3 mg Oral QHS   OLANZapine  5 mg Oral QHS   pantoprazole  40 mg Oral QHS   predniSONE  60 mg Oral Q breakfast   senna-docusate  1 tablet Oral BID   tamsulosin  0.4 mg Oral QPC supper   valACYclovir  1,000 mg Oral TID   cyanocobalamin  1,000 mcg Oral Daily   Continuous Infusions:   LOS: 0 days    Time spent: 35 minutes.     Elmarie Shiley, MD [facility]   If 7PM-7AM, please contact night-coverage [email or web address]  02/06/2021, 1:55 PM

## 2021-02-06 NOTE — TOC Progression Note (Signed)
Transition of Care [facility]) - Progression Note    Patient Details  Name: Lance [name] MRN: [id number] Date of Birth: [birth year]  Transition of Care [facility]) CM/SW [place], [place] Phone Number: 02/06/2021, 4:18 PM  Clinical Narrative:   CSW received call from [facility] asking for peer to peer. CSW provided information to MD who did peer to peer and insurance denied. CSW spoke with patient to update, and ask about whether he wanted to go home with home health or look for long term SNF with Medicaid. Patient said he'd like to return home if possible, but his daughter is the one who helps take care of him and that CSW would need to talk to her to make sure. CSW spoke with [name], daughter, to update, and she is no longer able to take care of the patient and he will need long term care. CSW faxed patient out and is awaiting responses on Medicaid beds.    Expected Discharge Plan: [place] Barriers to Discharge: Continued Medical Work up, Ship broker, SNF Pending bed offer  Expected Discharge Plan and Services Expected Discharge Plan: [place] Choice: [place] arrangements for the past 2 months: Single Family Home                                       Social Determinants of Health (SDOH) Interventions    Readmission Risk Interventions No flowsheet data found.

## 2021-02-07 DIAGNOSIS — G510 Bell's palsy: Secondary | ICD-10-CM | POA: Diagnosis not present

## 2021-02-07 DIAGNOSIS — N400 Enlarged prostate without lower urinary tract symptoms: Secondary | ICD-10-CM | POA: Diagnosis not present

## 2021-02-07 LAB — GLUCOSE, CAPILLARY
Glucose-Capillary: 104 mg/dL — ABNORMAL HIGH (ref 70–99)
Glucose-Capillary: 189 mg/dL — ABNORMAL HIGH (ref 70–99)
Glucose-Capillary: 389 mg/dL — ABNORMAL HIGH (ref 70–99)
Glucose-Capillary: 427 mg/dL — ABNORMAL HIGH (ref 70–99)
Glucose-Capillary: 389 mg/dL — ABNORMAL HIGH (ref 70–99)

## 2021-02-07 NOTE — Progress Notes (Signed)
Inpatient Diabetes Program Recommendations  AACE/ADA: New Consensus Statement on Inpatient Glycemic Control (2015)  Target Ranges:  Prepandial:   less than 140 mg/dL      Peak postprandial:   less than 180 mg/dL (1-2 hours)      Critically ill patients:  140 - 180 mg/dL   Lab Results  Component Value Date   GLUCAP 104 (H) 02/07/2021   HGBA1C 8.6 (H) 02/05/2021    Review of Glycemic Control Results for Lance [name], Lance [name] (MRN [id number]) as of 02/07/2021 09:39  Ref. Range 02/06/2021 08:28 02/06/2021 12:08 02/06/2021 16:32 02/06/2021 21:39 02/07/2021 08:06  Glucose-Capillary Latest Ref Range: 70 - 99 mg/dL 187 (H)  Novolog 13 units 140 (H)  Novolog 12 units 428 (H)  Novolog 22 units 391 (H)  Novolog 15 units  104 (H)   Diabetes history: DM 2 Outpatient Diabetes medications:  Current orders for Inpatient glycemic control:  Levemir 23 units Novolog 0-15 units 4x/day Novolog 10 units tid meal coverage  A1c 8.6% on 8/29 PO prednisone 60 mg Daily  Inpatient Diabetes Program Recommendations:    - consider increasing Novolog meal coverage to 12 units   Thanks,  Tama Headings RN, MSN, BC-ADM Inpatient Diabetes Coordinator Team Pager [phone] (8a-5p)

## 2021-02-07 NOTE — Progress Notes (Signed)
PROGRESS NOTE  Lance [name] [id number] DOB: [birth year] DOA: 02/04/2021 PCP: Theotis Burrow, MD   LOS: 0 days   Brief Narrative / Interim history: 75 year old with past medical history significant for insulin-dependent diabetes type 2, previous stroke, hepatitis C, schizophrenia, recent diagnosis of right frontal intraparenchymal hemorrhage in May 123456, diastolic heart failure meningioma, hyperlipidemia, peripheral artery disease, cocaine abuse, GERD, BPH, presents to ED for evaluation of right side weakness.  Patient developed right facial droop between Thursday and Saturday.  His  facial droop persisted for 2 days and he also developed associated right-sided weakness and difficulty with performing ADLs. Patient was evaluated by neurology, they could not identify any actual extremity weakness but only right facial weakness.  Since CT head without acute stroke.  Patient was admitted for further evaluation and stroke work-up. After further evaluation patient was diagnosed with right side Bell's palsy   Subjective / 24h Interval events: Doing well today, stil with facial weakness  Assessment & Plan: Principal Problem Right side Bell's palsy -MRI: Mild asymmetric enhancement of the right IAC fundus suggestive of Bell's palsy given the history.  No acute intracranial hemorrhage or infarct.  Stable right frontal meningioma.  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 Discussed with neurology plan for prednisone and acyclovir for 7 days, today is day #3.  No need for prednisone taper.  Okay to discontinue aspirin.  Active Problems History of hepatitis C without coma -follow up As an outpatient.   History of schizophrenia-Continue with Zyprexa.    Type 2 diabetes with hyperglycemia-A1C:8.6, On Levemir 23 units daily. 10 units novolog meal coverage. SSI  CBG (last 3)  Recent Labs     02/06/21 2139 02/07/21 0806 02/07/21 1139  GLUCAP 391* 104* 189*     GERD- Continue with PPI   BPH-Continue with Flomax   Chronic diastolic heart failure-compensated   HTN-Resume Norvasc.   Scheduled Meds:  amLODipine  10 mg Oral Daily   atorvastatin  20 mg Oral Daily   busPIRone  5 mg Oral BID   citalopram  20 mg Oral Daily   docusate sodium  100 mg Oral BID   enoxaparin (LOVENOX) injection  40 mg Subcutaneous Daily   gabapentin  100 mg Oral TID   insulin aspart  0-15 Units Subcutaneous TID AC & HS   insulin aspart  10 Units Subcutaneous TID WC   insulin detemir  23 Units Subcutaneous QHS   melatonin  3 mg Oral QHS   OLANZapine  5 mg Oral QHS   pantoprazole  40 mg Oral QHS   predniSONE  60 mg Oral Q breakfast   senna-docusate  1 tablet Oral BID   tamsulosin  0.4 mg Oral QPC supper   valACYclovir  1,000 mg Oral TID   cyanocobalamin  1,000 mcg Oral Daily   Continuous Infusions: PRN Meds:.acetaminophen **OR** acetaminophen (TYLENOL) oral liquid 160 mg/5 mL **OR** acetaminophen, albuterol, artificial tears, hydrALAZINE, ondansetron (ZOFRAN) IV, polyethylene glycol, polyvinyl alcohol, traMADol  Diet Orders (From admission, onward)     Start     Ordered   02/05/21 0950  Diet Carb Modified Fluid consistency: Thin; Room service appropriate? Yes  Diet effective now       Question Answer Comment  Diet-HS Snack? Nothing   Calorie Level Medium 1600-2000   Fluid consistency: Thin   Room service appropriate? Yes      02/05/21 0949  DVT prophylaxis: enoxaparin (LOVENOX) injection 40 mg Start: 02/05/21 1400     Code Status: Full Code  Family Communication: no family at bedside   Status is: Observation  The patient will require care spanning > 2 midnights and should be moved to inpatient because: Unsafe d/c plan  Dispo: The patient is from: Home              Anticipated d/c is to: SNF              Patient currently is medically stable to d/c.    Difficult to place patient No  Level of care: Telemetry Medical  Consultants:  Neurology   Procedures:  none  Microbiology  none  Antimicrobials: none    Objective: Vitals:   02/06/21 2338 02/07/21 0351 02/07/21 0728 02/07/21 1142  BP: 123/68 132/78 (!) 166/93 117/78  Pulse: 67 60 67 70  Resp: '18 18 18 16  '$ Temp: 98 F (36.7 C) 98.6 F (37 C) 97.9 F (36.6 C) 98.3 F (36.8 C)  TempSrc:   Oral Axillary  SpO2: 100% 100% 100% 100%  Weight:      Height:        Intake/Output Summary (Last 24 hours) at 02/07/2021 1308 Last data filed at 02/07/2021 1141 Gross per 24 hour  Intake 120 ml  Output 1550 ml  Net -1430 ml   Filed Weights   02/05/21 0133  Weight: 79.4 kg    Examination:  Constitutional: NAD Eyes: no scleral icterus ENMT: Mucous membranes are moist.  Neck: normal, supple Respiratory: clear to auscultation bilaterally, no wheezing, no crackles. Normal respiratory effort. No accessory muscle use.  Cardiovascular: Regular rate and rhythm, no murmurs / rubs / gallops. No LE edema. Good peripheral pulses Abdomen: non distended, no tenderness. Bowel sounds positive.  Musculoskeletal: no clubbing / cyanosis.  Skin: no rashes Neurologic: CN 2-12 grossly intact. Strength 5/5 in all 4. Right sided facial palsy  Data Reviewed: I have independently reviewed following labs and imaging studies   CBC: Recent Labs  Lab 02/05/21 0000 02/05/21 0007  WBC 9.9  --   NEUTROABS 7.3  --   HGB 11.1* 10.9*  HCT 35.2* 32.0*  MCV 95.1  --   PLT 206  --    Basic Metabolic Panel: Recent Labs  Lab 02/05/21 0000 02/05/21 0007  NA 141 143  K 3.6 3.6  CL 108 106  CO2 26  --   GLUCOSE 142* 137*  BUN 11 12  CREATININE 1.12 1.10  CALCIUM 8.6*  --    Liver Function Tests: Recent Labs  Lab 02/05/21 0000  AST 11*  ALT 13  ALKPHOS 95  BILITOT 0.6  PROT 6.5  ALBUMIN 3.0*   Coagulation Profile: Recent Labs  Lab 02/05/21 0000  INR 1.2   HbA1C: Recent Labs     02/05/21 0000  HGBA1C 8.6*   CBG: Recent Labs  Lab 02/06/21 1208 02/06/21 1632 02/06/21 2139 02/07/21 0806 02/07/21 1139  GLUCAP 140* 428* 391* 104* 189*    Recent Results (from the past 240 hour(s))  Resp Panel by RT-PCR (Flu A&B, Covid) Nasopharyngeal Swab     Status: None   Collection Time: 02/05/21 12:01 AM   Specimen: Nasopharyngeal Swab; Nasopharyngeal(NP) swabs in vial transport medium  Result Value Ref Range Status   SARS Coronavirus 2 by RT PCR NEGATIVE NEGATIVE Final    Comment: (NOTE) SARS-CoV-2 target nucleic acids are NOT DETECTED.  The SARS-CoV-2 RNA is generally detectable in upper respiratory specimens  during the acute phase of infection. The lowest concentration of SARS-CoV-2 viral copies this assay can detect is 138 copies/mL. A negative result does not preclude SARS-Cov-2 infection and should not be used as the sole basis for treatment or other patient management decisions. A negative result may occur with  improper specimen collection/handling, submission of specimen other than nasopharyngeal swab, presence of viral mutation(s) within the areas targeted by this assay, and inadequate number of viral copies(<138 copies/mL). A negative result must be combined with clinical observations, patient history, and epidemiological information. The expected result is Negative.  Fact Sheet for Patients:  EntrepreneurPulse.com.au  Fact Sheet for Healthcare Providers:  IncredibleEmployment.be  This test is no t yet approved or cleared by the [place] FDA and  has been authorized for detection and/or diagnosis of SARS-CoV-2 by FDA under an Emergency Use Authorization (EUA). This EUA will remain  in effect (meaning this test can be used) for the duration of the COVID-19 declaration under Section 564(b)(1) of the Act, 21 U.S.C.section 360bbb-3(b)(1), unless the authorization is terminated  or revoked sooner.        Influenza A by PCR NEGATIVE NEGATIVE Final   Influenza B by PCR NEGATIVE NEGATIVE Final    Comment: (NOTE) The Xpert Xpress SARS-CoV-2/FLU/RSV plus assay is intended as an aid in the diagnosis of influenza from Nasopharyngeal swab specimens and should not be used as a sole basis for treatment. Nasal washings and aspirates are unacceptable for Xpert Xpress SARS-CoV-2/FLU/RSV testing.  Fact Sheet for Patients: EntrepreneurPulse.com.au  Fact Sheet for Healthcare Providers: IncredibleEmployment.be  This test is not yet approved or cleared by the [place] FDA and has been authorized for detection and/or diagnosis of SARS-CoV-2 by FDA under an Emergency Use Authorization (EUA). This EUA will remain in effect (meaning this test can be used) for the duration of the COVID-19 declaration under Section 564(b)(1) of the Act, 21 U.S.C. section 360bbb-3(b)(1), unless the authorization is terminated or revoked.  Performed at [facility], [place] [address], [place], [place] [address]      Radiology Studies: No results found.   Marzetta Board, MD, PhD [facility]  Between 7 am - 7 pm I am available, please contact me via Amion (for emergencies) or Securechat (non urgent messages)  Between 7 pm - 7 am I am not available, please contact night coverage MD/APP via Amion

## 2021-02-08 DIAGNOSIS — N400 Enlarged prostate without lower urinary tract symptoms: Secondary | ICD-10-CM | POA: Diagnosis not present

## 2021-02-08 DIAGNOSIS — G510 Bell's palsy: Secondary | ICD-10-CM | POA: Diagnosis not present

## 2021-02-08 LAB — GLUCOSE, CAPILLARY
Glucose-Capillary: 273 mg/dL — ABNORMAL HIGH (ref 70–99)
Glucose-Capillary: 199 mg/dL — ABNORMAL HIGH (ref 70–99)
Glucose-Capillary: 295 mg/dL — ABNORMAL HIGH (ref 70–99)
Glucose-Capillary: 345 mg/dL — ABNORMAL HIGH (ref 70–99)

## 2021-02-08 MED ORDER — DICLOFENAC SODIUM 1 % EX GEL
2.0000 g | Freq: Four times a day (QID) | CUTANEOUS | Status: DC
  Administered 2021-02-08 – 2021-02-09 (×9): 2 g via TOPICAL
  Filled 2021-02-08: qty 100

## 2021-02-08 NOTE — Progress Notes (Signed)
Occupational Therapy Treatment Note  Pt abel to sustain attention to task with mod redirectional cues. Completed seated ADL sitting EOB with S then ambulated 12 ft x 2 using +2 mod A . Used "3 musketeer" positioning technique with good carry over. Continue to recommend rehab at [facility].    02/08/21 1542  OT Visit Information  Last OT Received On 02/08/21  Assistance Needed +2  PT/OT/SLP Co-Evaluation/Treatment Yes  Reason for Co-Treatment Complexity of the patient's impairments (multi-system involvement);Necessary to address cognition/behavior during functional activity;For patient/therapist safety;To address functional/ADL transfers  OT goals addressed during session ADL's and self-care  History of Present Illness 75 yo admitted with R facial droop 02/04/21. MRI (-) for CVA and suggestive of Bell's palsy . PMH: DM; CVA, Hep C, schizophrenia, CHF, cocaine abuse. Recently discharged from [facility] 11/16/2020 s/p R frontal parietal parenchymal bleed. discharged home at that time with HHOT/PT with recommendations for [facility] lift, hospital bed and wc.  Precautions  Precautions Fall  Pain Assessment  Pain Assessment Faces  Faces Pain Scale 4  Pain Location neck (suboccipital area)  Pain Descriptors / Indicators Aching;Grimacing;Discomfort  Pain Intervention(s) Limited activity within patient's tolerance;Patient requesting pain meds-RN notified (nsg brought in Voltaren cream)  Cognition  Arousal/Alertness Awake/alert  Behavior During Therapy Impulsive;Restless  Overall Cognitive Status No family/caregiver present to determine baseline cognitive functioning  Area of Impairment Orientation;Attention;Memory;Following commands;Safety/judgement;Awareness;Problem solving  Orientation Level Disoriented to;Time  Current Attention Level Sustained  Memory Decreased short-term memory;Decreased recall of precautions  Following Commands Follows one step commands inconsistently  Safety/Judgement Decreased awareness of  safety;Decreased awareness of deficits  Awareness Intellectual  Problem Solving Slow processing;Difficulty sequencing;Requires tactile cues;Requires verbal cues  General Comments Pt easily distracted and tangential.  Upper Extremity Assessment  Upper Extremity Assessment LUE deficits/detail  LUE Deficits / Details using functionally with coordiantion deficits noted  ADL  Grooming Set up;Supervision/safety;Sitting  Functional mobility during ADLs +2 for physical assistance;Moderate assistance  General ADL Comments facilitated forward lean by rubbing lotion on BLE  Bed Mobility  Overal bed mobility Needs Assistance  Bed Mobility Supine to Sit;Sit to Supine  Supine to sit Min assist;+2 for safety/equipment  Balance  Overall balance assessment Needs assistance  Sitting-balance support No upper extremity supported  Sitting balance-Leahy Scale Fair  Sitting balance - Comments EOB with supervision  Standing balance-Leahy Scale Poor  Standing balance comment Requiring UE support and min-mod A of 2  Vision- Assessment  Vision Assessment? Vision impaired- to be further tested in functional context  Additional Comments R eye  - unable to fully close lid; would benefit form drops - notified nsg; recommend papaer tape lid closed at night to reduce risk of injury to eye; poor visual attention and impared scanning  Transfers  Overall transfer level Needs assistance  Equipment used 2 person hand held assist;Rolling walker (2 wheeled)  Transfers Sit to/from Stand  Sit to Stand +2 physical assistance;Mod assist  General transfer comment Stood from bed with RW - cues/assist for foot placement and safe hand placement with light mod A of 2 to rise.  Stood from recliner x 2 with arms over therapist shoulders with ligh mod A of 2, again cues and assist for foot placement  General Comments  General comments (skin integrity, edema, etc.) Ambulated @ 12 ft x 2 trials using arms over therapist's shoulders to  facilitate upright posture adn normal pattern of weight shift; cues ot decrease talking adn to sustain attention on activity  OT - End of Session  Equipment  Utilized During Treatment Gait belt  Activity Tolerance Patient tolerated treatment well  Patient left in chair;with call bell/phone within reach;with chair alarm set  Nurse Communication Mobility status;Other (comment) (pt wants to call daughter; wrong number in chart - attempted x 2)  OT Assessment/Plan  OT Plan Discharge plan remains appropriate  OT Visit Diagnosis Unsteadiness on feet (R26.81);Other abnormalities of gait and mobility (R26.89);Muscle weakness (generalized) (M62.81);Other symptoms and signs involving cognitive function;Pain  Pain - part of body  (neck)  OT Frequency (ACUTE ONLY) Min 2X/week  Follow Up Recommendations SNF  OT Equipment None recommended by OT  AM-PAC OT "6 Clicks" Daily Activity Outcome Measure (Version 2)  Help from another person eating meals? 3  Help from another person taking care of personal grooming? 3  Help from another person toileting, which includes using toliet, bedpan, or urinal? 1  Help from another person bathing (including washing, rinsing, drying)? 2  Help from another person to put on and taking off regular upper body clothing? 2  Help from another person to put on and taking off regular lower body clothing? 2  6 Click Score 13  Progressive Mobility  What is the highest level of mobility based on the progressive mobility assessment? Level 3 (Stands with assist) - Balance while standing  and cannot march in place  OT Goal Progression  Progress towards OT goals Progressing toward goals  Acute Rehab OT Goals  Patient Stated Goal to get some more rehab  OT Goal Formulation With patient/family  Time For Goal Achievement 02/19/21  Potential to Achieve Goals Good  ADL Goals  Pt Will Perform Lower Body Bathing with mod assist;sitting/lateral leans;sit to/from stand  Pt Will Transfer to  Toilet with mod assist;bedside commode;squat pivot transfer  Additional ADL Goal #1 Pt will maintain midlein postural control EOB with S in preparation for ADL tasks  OT Time Calculation  OT Start Time (ACUTE ONLY) 1405  OT Stop Time (ACUTE ONLY) 1433  OT Time Calculation (min) 28 min  OT General Charges  $OT Visit 1 Visit  OT Treatments  $Self Care/Home Management  8-22 mins  Maurie Boettcher, OT/L   Acute OT Clinical Specialist [place] Pager [phone] Office [phone]

## 2021-02-08 NOTE — Progress Notes (Signed)
  Speech Language Pathology Treatment: Cognitive-Linquistic  Patient Details Name: Lance [name] MRN: [id number] DOB: 1946-02-04 Today's Date: 02/08/2021 Time: [id number] SLP Time Calculation (min) (ACUTE ONLY): 32 min  Assessment / Plan / Recommendation Clinical Impression  Pt was seen for skilled speech therapy for education of compensatory speech strategies to increase intelligibility. Pt presents with Bell's Palsy resulting in weakness and asymmetry of facial muscles on right side, mildly impacting intelligibility. SLP trained pt with the following strategies: pausing between words, slow rate, over-articulation. SLP provided max verbal cues and models to elicit speech strategies. Pt eating lunch during session; SLP perceived attention deficits may have impacted generalization/carryover. SLP attempted to redirect attention by waiting for pt to pause during eating to implement strategies, resulting in slight increase in strategy implementation when max verbal cues were provided. SLP to f/u for cognitive tx.   HPI HPI: 75 year old male with past medical history of insulin-dependent diabetes mellitus type 2, previous stroke, hepatitis C, schizophrenia, recent diagnosis of right frontal intraparenchymal hemorrhage in May 123456, diastolic congestive heart failure (Echo 10/2020 EF 70-75% with G1DD), meningioma, hyperlipidemia, peripheral artery disease, cocaine abuse, gastroesophageal reflux disease and benign prostatic hyperplasia who presents to [facility] emergency department for evaluation via EMS due to development of right-sided weakness. Diagnosed with Bell's Palsy.      SLP Plan  Goals updated       Recommendations                   Oral Care Recommendations: Oral care BID Follow up Recommendations: Skilled Nursing facility;24 hour supervision/assistance SLP Visit Diagnosis: Cognitive communication deficit (R41.841);Dysarthria and anarthria (R47.1) Plan: Goals  updated       GO              Dewitt Rota, SLP-Student   Dewitt Rota 02/08/2021, 1:02 PM

## 2021-02-08 NOTE — TOC Progression Note (Signed)
Transition of Care [facility]) - Progression Note    Patient Details  Name: Lance [name] MRN: [id number] Date of Birth: 1945/09/29  Transition of Care [facility]) CM/SW [place], [place] Phone Number: 02/08/2021, 2:21 PM  Clinical Narrative:   CSW again contacted [facility] and [facility] to ask about ability to take patient for LTC. No response received yet. CSW has faxed patient out further, still no bed offers. CSW to follow.    Expected Discharge Plan: [place] Barriers to Discharge: Continued Medical Work up, Ship broker, SNF Pending bed offer  Expected Discharge Plan and Services Expected Discharge Plan: [place] Choice: [place] arrangements for the past 2 months: Single Family Home                                       Social Determinants of Health (SDOH) Interventions    Readmission Risk Interventions No flowsheet data found.

## 2021-02-08 NOTE — Progress Notes (Signed)
PROGRESS NOTE  Lance [name] [id number] DOB: 03-Feb-1946 DOA: 02/04/2021 PCP: Theotis Burrow, MD   LOS: 0 days   Brief Narrative / Interim history: 75 year old with past medical history significant for insulin-dependent diabetes type 2, previous stroke, hepatitis C, schizophrenia, recent diagnosis of right frontal intraparenchymal hemorrhage in May [id number], diastolic heart failure meningioma, hyperlipidemia, peripheral artery disease, cocaine abuse, GERD, BPH, presents to ED for evaluation of right side weakness.  Patient developed right facial droop between Thursday and Saturday.  His  facial droop persisted for 2 days and he also developed associated right-sided weakness and difficulty with performing ADLs. Patient was evaluated by neurology, they could not identify any actual extremity weakness but only right facial weakness.  Since CT head without acute stroke.  Patient was admitted for further evaluation and stroke work-up. After further evaluation patient was diagnosed with right side Bell's palsy   Subjective / 24h Interval events: Complains of neck pain, has been bothering him for several months  Assessment & Plan: Principal Problem Right side Bell's palsy -MRI: Mild asymmetric enhancement of the right IAC fundus suggestive of Bell's palsy given the history.  No acute intracranial hemorrhage or infarct.  Stable right frontal meningioma.  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 Discussed with neurology plan for prednisone and acyclovir for 7 days, today is day #4.  No need for prednisone taper.  Okay to discontinue aspirin.  Active Problems History of hepatitis C without coma -follow up As an outpatient.   History of schizophrenia-Continue with Zyprexa.    Type 2 diabetes with hyperglycemia-A1C:8.6, On Levemir 23 units daily. 10 units novolog meal coverage. SSI  CBG (last  3)  Recent Labs    02/07/21 2206 02/08/21 0610 02/08/21 1123  GLUCAP 389* 273* 199*      GERD- Continue with PPI   BPH-Continue with Flomax   Chronic diastolic heart failure-compensated   HTN-Resume Norvasc.   Scheduled Meds:  amLODipine  10 mg Oral Daily   atorvastatin  20 mg Oral Daily   busPIRone  5 mg Oral BID   citalopram  20 mg Oral Daily   diclofenac Sodium  2 g Topical QID   docusate sodium  100 mg Oral BID   enoxaparin (LOVENOX) injection  40 mg Subcutaneous Daily   gabapentin  100 mg Oral TID   insulin aspart  0-15 Units Subcutaneous TID AC & HS   insulin aspart  10 Units Subcutaneous TID WC   insulin detemir  23 Units Subcutaneous QHS   melatonin  3 mg Oral QHS   OLANZapine  5 mg Oral QHS   pantoprazole  40 mg Oral QHS   predniSONE  60 mg Oral Q breakfast   senna-docusate  1 tablet Oral BID   tamsulosin  0.4 mg Oral QPC supper   valACYclovir  1,000 mg Oral TID   cyanocobalamin  1,000 mcg Oral Daily   Continuous Infusions: PRN Meds:.acetaminophen **OR** acetaminophen (TYLENOL) oral liquid 160 mg/5 mL **OR** acetaminophen, albuterol, artificial tears, hydrALAZINE, ondansetron (ZOFRAN) IV, polyethylene glycol, polyvinyl alcohol, traMADol  Diet Orders (From admission, onward)     Start     Ordered   02/05/21 0950  Diet Carb Modified Fluid consistency: Thin; Room service appropriate? Yes  Diet effective now       Question Answer Comment  Diet-HS Snack? Nothing   Calorie Level Medium 1600-2000   Fluid consistency: Thin   Room service appropriate? Yes  02/05/21 0949            DVT prophylaxis: enoxaparin (LOVENOX) injection 40 mg Start: 02/05/21 1400     Code Status: Full Code  Family Communication: no family at bedside   Status is: Observation  The patient will require care spanning > 2 midnights and should be moved to inpatient because: Unsafe d/c plan  Dispo: The patient is from: Home              Anticipated d/c is to: SNF               Patient currently is medically stable to d/c.   Difficult to place patient No  Level of care: Telemetry Medical  Consultants:  Neurology   Procedures:  none  Microbiology  none  Antimicrobials: none    Objective: Vitals:   02/08/21 0353 02/08/21 0800 02/08/21 0901 02/08/21 1200  BP: (!) 153/83 (!) 141/85 (!) 141/81 115/87  Pulse: 71 73 67 95  Resp: '17 18 20 18  '$ Temp: 98.2 F (36.8 C) 97.7 F (36.5 C) 97.9 F (36.6 C) 97.9 F (36.6 C)  TempSrc: Oral Oral Oral Oral  SpO2: 100% 100% 100% 100%  Weight:      Height:        Intake/Output Summary (Last 24 hours) at 02/08/2021 1405 Last data filed at 02/08/2021 1200 Gross per 24 hour  Intake 1160 ml  Output 2250 ml  Net -1090 ml    Filed Weights   02/05/21 0133  Weight: 79.4 kg    Examination:  Constitutional: nad Respiratory: CTA biL, no wheezing Cardiovascular: RRR, no mrg  Data Reviewed: I have independently reviewed following labs and imaging studies   CBC: Recent Labs  Lab 02/05/21 0000 02/05/21 0007  WBC 9.9  --   NEUTROABS 7.3  --   HGB 11.1* 10.9*  HCT 35.2* 32.0*  MCV 95.1  --   PLT 206  --     Basic Metabolic Panel: Recent Labs  Lab 02/05/21 0000 02/05/21 0007  NA 141 143  K 3.6 3.6  CL 108 106  CO2 26  --   GLUCOSE 142* 137*  BUN 11 12  CREATININE 1.12 1.10  CALCIUM 8.6*  --     Liver Function Tests: Recent Labs  Lab 02/05/21 0000  AST 11*  ALT 13  ALKPHOS 95  BILITOT 0.6  PROT 6.5  ALBUMIN 3.0*    Coagulation Profile: Recent Labs  Lab 02/05/21 0000  INR 1.2    HbA1C: No results for input(s): HGBA1C in the last 72 hours.  CBG: Recent Labs  Lab 02/07/21 1534 02/07/21 2103 02/07/21 2206 02/08/21 0610 02/08/21 1123  GLUCAP 389* 427* 389* 273* 199*     Recent Results (from the past 240 hour(s))  Resp Panel by RT-PCR (Flu A&B, Covid) Nasopharyngeal Swab     Status: None   Collection Time: 02/05/21 12:01 AM   Specimen: Nasopharyngeal Swab;  Nasopharyngeal(NP) swabs in vial transport medium  Result Value Ref Range Status   SARS Coronavirus 2 by RT PCR NEGATIVE NEGATIVE Final    Comment: (NOTE) SARS-CoV-2 target nucleic acids are NOT DETECTED.  The SARS-CoV-2 RNA is generally detectable in upper respiratory specimens during the acute phase of infection. The lowest concentration of SARS-CoV-2 viral copies this assay can detect is 138 copies/mL. A negative result does not preclude SARS-Cov-2 infection and should not be used as the sole basis for treatment or other patient management decisions. A negative result may occur with  improper specimen collection/handling, submission of specimen other than nasopharyngeal swab, presence of viral mutation(s) within the areas targeted by this assay, and inadequate number of viral copies(<138 copies/mL). A negative result must be combined with clinical observations, patient history, and epidemiological information. The expected result is Negative.  Fact Sheet for Patients:  EntrepreneurPulse.com.au  Fact Sheet for Healthcare Providers:  IncredibleEmployment.be  This test is no t yet approved or cleared by the [place] FDA and  has been authorized for detection and/or diagnosis of SARS-CoV-2 by FDA under an Emergency Use Authorization (EUA). This EUA will remain  in effect (meaning this test can be used) for the duration of the COVID-19 declaration under Section 564(b)(1) of the Act, 21 U.S.C.section 360bbb-3(b)(1), unless the authorization is terminated  or revoked sooner.       Influenza A by PCR NEGATIVE NEGATIVE Final   Influenza B by PCR NEGATIVE NEGATIVE Final    Comment: (NOTE) The Xpert Xpress SARS-CoV-2/FLU/RSV plus assay is intended as an aid in the diagnosis of influenza from Nasopharyngeal swab specimens and should not be used as a sole basis for treatment. Nasal washings and aspirates are unacceptable for Xpert Xpress  SARS-CoV-2/FLU/RSV testing.  Fact Sheet for Patients: EntrepreneurPulse.com.au  Fact Sheet for Healthcare Providers: IncredibleEmployment.be  This test is not yet approved or cleared by the [place] FDA and has been authorized for detection and/or diagnosis of SARS-CoV-2 by FDA under an Emergency Use Authorization (EUA). This EUA will remain in effect (meaning this test can be used) for the duration of the COVID-19 declaration under Section 564(b)(1) of the Act, 21 U.S.C. section 360bbb-3(b)(1), unless the authorization is terminated or revoked.  Performed at [facility], [place] [address] [place], [place] [address]       Radiology Studies: No results found.   Marzetta Board, MD, PhD [facility]  Between 7 am - 7 pm I am available, please contact me via Amion (for emergencies) or Securechat (non urgent messages)  Between 7 pm - 7 am I am not available, please contact night coverage MD/APP via Amion

## 2021-02-08 NOTE — Progress Notes (Signed)
Physical Therapy Treatment Patient Details Name: Lance [name] MRN: [id number] DOB: 1945-06-29 Today's Date: 02/08/2021    History of Present Illness 75 yo admitted with R facial droop 02/04/21. MRI (-) for CVA and suggestive of Bell's palsy . PMH: DM; CVA, Hep C, schizophrenia, CHF, cocaine abuse. Recently discharged from [facility] 11/16/2020 s/p R frontal parietal parenchymal bleed. discharged home at that time with HHOT/PT with recommendations for [facility] lift, hospital bed and wc.    PT Comments    Pt making excellent progress today.  He was able to transfer to EOB with less assist and ambulated 10'x2 with "3 musketeer" gait. Pt continues to have decreased safety, tangential, and needs cues to focus on task at hand. Pt met goals and goals updated.  Do continue to recommend SNF as pt is making progress with therapy.     Follow Up Recommendations  SNF;Supervision/Assistance - 24 hour     Equipment Recommendations  Other (comment) (has DME at home)    Recommendations for Other Services       Precautions / Restrictions Precautions Precautions: Fall    Mobility  Bed Mobility Overal bed mobility: Needs Assistance Bed Mobility: Supine to Sit;Sit to Supine     Supine to sit: Min assist;+2 for safety/equipment          Transfers Overall transfer level: Needs assistance Equipment used: 2 person hand held assist;Rolling walker (2 wheeled) Transfers: Sit to/from Stand Sit to Stand: +2 physical assistance;Mod assist         General transfer comment: Stood from bed with RW - cues/assist for foot placement and safe hand placement with light mod A of 2 to rise.  Stood from recliner x 2 with arms over therapist shoulders with ligh mod A of 2, again cues and assist for foot placement  Ambulation/Gait Ambulation/Gait assistance: Mod assist;+2 physical assistance Gait Distance (Feet): 10 Feet (10'x2) Assistive device: 2 person hand held assist ("3-muskteer") Gait Pattern/deviations:  Step-to pattern;Decreased stride length;Decreased stance time - left;Decreased weight shift to left;Ataxic Gait velocity: decreased   General Gait Details: Ambulated with "3 Muskteer" gait; Ataxic on L LE with tactile cues and verbal cues for sequencing; cues to focus on task at hand; chair follow   Stairs             Wheelchair Mobility    Modified Rankin (Stroke Patients Only) Modified Rankin (Stroke Patients Only) Pre-Morbid Rankin Score: Moderately severe disability Modified Rankin: Moderately severe disability     Balance Overall balance assessment: Needs assistance Sitting-balance support: No upper extremity supported Sitting balance-Leahy Scale: Fair Sitting balance - Comments: EOB with supervision     Standing balance-Leahy Scale: Poor Standing balance comment: Requiring UE support and min-mod A of 2                            Cognition Arousal/Alertness: Awake/alert Behavior During Therapy: Impulsive;Restless Overall Cognitive Status: No family/caregiver present to determine baseline cognitive functioning Area of Impairment: Orientation;Attention;Memory;Following commands;Safety/judgement;Awareness;Problem solving                 Orientation Level: Disoriented to;Time Current Attention Level: Sustained Memory: Decreased short-term memory;Decreased recall of precautions Following Commands: Follows one step commands inconsistently Safety/Judgement: Decreased awareness of safety;Decreased awareness of deficits Awareness: Intellectual Problem Solving: Slow processing;Difficulty sequencing;Requires tactile cues;Requires verbal cues General Comments: Pt easily distracted and tangential.      Exercises      General Comments  Pertinent Vitals/Pain Pain Assessment: Faces Faces Pain Scale: Hurts little more Pain Location: neck (suboccipital area) Pain Descriptors / Indicators: Aching;Grimacing;Discomfort Pain Intervention(s):  Limited activity within patient's tolerance;Repositioned;Monitored during session;Patient requesting pain meds-RN notified;Utilized relaxation techniques    Home Living                      Prior Function            PT Goals (current goals can now be found in the care plan section) Acute Rehab PT Goals Patient Stated Goal: to get some more rehab PT Goal Formulation: With patient/family Time For Goal Achievement: 02/19/21 Potential to Achieve Goals: Good Progress towards PT goals: Goals met and updated - see care plan    Frequency    Min 2X/week      PT Plan Current plan remains appropriate    Co-evaluation PT/OT/SLP Co-Evaluation/Treatment: Yes Reason for Co-Treatment: Complexity of the patient's impairments (multi-system involvement);For patient/therapist safety PT goals addressed during session: Mobility/safety with mobility;Balance OT goals addressed during session: ADL's and self-care      AM-PAC PT "6 Clicks" Mobility   Outcome Measure  Help needed turning from your back to your side while in a flat bed without using bedrails?: A Little Help needed moving from lying on your back to sitting on the side of a flat bed without using bedrails?: A Little Help needed moving to and from a bed to a chair (including a wheelchair)?: Total Help needed standing up from a chair using your arms (e.g., wheelchair or bedside chair)?: Total Help needed to walk in hospital room?: Total Help needed climbing 3-5 steps with a railing? : Total 6 Click Score: 10    End of Session Equipment Utilized During Treatment: Gait belt Activity Tolerance: Patient tolerated treatment well Patient left: with call bell/phone within reach;with chair alarm set;in chair Nurse Communication: Mobility status;Patient requests pain meds (RN during ambulation) PT Visit Diagnosis: Muscle weakness (generalized) (M62.81);Other symptoms and signs involving the nervous system (R29.898);Other  abnormalities of gait and mobility (R26.89);Difficulty in walking, not elsewhere classified (R26.2)     Time: 1610-9604 PT Time Calculation (min) (ACUTE ONLY): 26 min  Charges:  $Gait Training: 8-22 mins                     Abran Richard, PT Acute Rehab Services Pager [phone] [facility] [place] 02/08/2021, 3:43 PM

## 2021-02-08 NOTE — TOC Progression Note (Signed)
Transition of Care [facility]) - Progression Note    Patient Details  Name: JASYAH SPOONEMORE MRN: [id number] Date of Birth: 1946-05-18  Transition of Care [facility]) CM/SW [place], [place] Phone Number: 02/08/2021, 2:20 PM  Clinical Narrative:   CSW continuing to follow for SNF placement. Patient continues to have no bed offers. CSW reached out to [facility], and they have no Medicaid beds available. CSW reached out to [facility] and [facility] and they reported having Medicaid beds available. CSW asked them to review patient's referral, awaiting response. CSW to follow.    Expected Discharge Plan: [place] Barriers to Discharge: Continued Medical Work up, Ship broker, SNF Pending bed offer  Expected Discharge Plan and Services Expected Discharge Plan: [place] Choice: [place] arrangements for the past 2 months: Single Family Home                                       Social Determinants of Health (SDOH) Interventions    Readmission Risk Interventions No flowsheet data found.

## 2021-02-09 DIAGNOSIS — I5032 Chronic diastolic (congestive) heart failure: Secondary | ICD-10-CM | POA: Diagnosis not present

## 2021-02-09 DIAGNOSIS — K219 Gastro-esophageal reflux disease without esophagitis: Secondary | ICD-10-CM | POA: Diagnosis not present

## 2021-02-09 DIAGNOSIS — G510 Bell's palsy: Secondary | ICD-10-CM | POA: Diagnosis not present

## 2021-02-09 DIAGNOSIS — N400 Enlarged prostate without lower urinary tract symptoms: Secondary | ICD-10-CM | POA: Diagnosis not present

## 2021-02-09 LAB — SARS CORONAVIRUS 2 (TAT 6-24 HRS): SARS Coronavirus 2: NEGATIVE

## 2021-02-09 LAB — GLUCOSE, CAPILLARY
Glucose-Capillary: 213 mg/dL — ABNORMAL HIGH (ref 70–99)
Glucose-Capillary: 151 mg/dL — ABNORMAL HIGH (ref 70–99)
Glucose-Capillary: 295 mg/dL — ABNORMAL HIGH (ref 70–99)
Glucose-Capillary: 324 mg/dL — ABNORMAL HIGH (ref 70–99)

## 2021-02-09 MED ORDER — ARTIFICIAL TEARS OPHTHALMIC OINT: 1.0000 "application " | TOPICAL_OINTMENT | OPHTHALMIC | Status: DC | PRN

## 2021-02-09 MED ORDER — VALACYCLOVIR HCL 1 G PO TABS: 1000.0000 mg | ORAL_TABLET | Freq: Three times a day (TID) | ORAL | 0 refills | Status: AC

## 2021-02-09 MED ORDER — TRAMADOL HCL 50 MG PO TABS: 50.0000 mg | ORAL_TABLET | Freq: Two times a day (BID) | ORAL | 0 refills | Status: AC | PRN

## 2021-02-09 MED ORDER — PREDNISONE 20 MG PO TABS: 60.0000 mg | ORAL_TABLET | Freq: Every day | ORAL | 0 refills | Status: AC

## 2021-02-09 NOTE — Progress Notes (Signed)
Attempted to give report to [facility] two times. Both times was placed on hold over 8 minutes.

## 2021-02-09 NOTE — TOC Transition Note (Signed)
Transition of Care [facility]) - CM/SW Discharge Note   Patient Details  Name: Lance [name] MRN: [id number] Date of Birth: Apr 09, 1946  Transition of Care [facility]) CM/SW Contact:  Geralynn Ochs, LCSW Phone Number: 02/09/2021, 2:06 PM   Clinical Narrative:   Nurse to call report to [phone], Room 40A.    Final next level of care: Skilled Nursing Facility Barriers to Discharge: Barriers Resolved   Patient Goals and CMS Choice Patient states their goals for this hospitalization and ongoing recovery are:: to get back home, if able CMS Medicare.gov Compare Post Acute Care list provided to:: Patient Choice offered to / list presented to : Patient, Adult Children  Discharge Placement              Patient chooses bed at: [facility] Patient to be transferred to facility by: [place] Name of family member notified: [name] Patient and family notified of of transfer: 02/09/21  Discharge Plan and Services     Post Acute Care Choice: [place]                               Social Determinants of Health (SDOH) Interventions     Readmission Risk Interventions No flowsheet data found.

## 2021-02-09 NOTE — Plan of Care (Signed)
Pt ready for discharge for [facility]. Pt took medications with out complication.    Problem: Education: Goal: Knowledge of disease or condition will improve Outcome: Adequate for Discharge Goal: Knowledge of secondary prevention will improve Outcome: Adequate for Discharge Goal: Knowledge of patient specific risk factors addressed and post discharge goals established will improve Outcome: Adequate for Discharge   Problem: Coping: Goal: Will verbalize positive feelings about self Outcome: Adequate for Discharge Goal: Will identify appropriate support needs Outcome: Adequate for Discharge   Problem: Health Behavior/Discharge Planning: Goal: Ability to manage health-related needs will improve Outcome: Adequate for Discharge   Problem: Self-Care: Goal: Ability to participate in self-care as condition permits will improve Outcome: Adequate for Discharge Goal: Verbalization of feelings and concerns over difficulty with self-care will improve Outcome: Adequate for Discharge Goal: Ability to communicate needs accurately will improve Outcome: Adequate for Discharge   Problem: Nutrition: Goal: Risk of aspiration will decrease Outcome: Adequate for Discharge Goal: Dietary intake will improve Outcome: Adequate for Discharge   Problem: Ischemic Stroke/TIA Tissue Perfusion: Goal: Complications of ischemic stroke/TIA will be minimized Outcome: Adequate for Discharge   Problem: Spontaneous Subarachnoid Hemorrhage Tissue Perfusion: Goal: Complications of Spontaneous Subarachnoid Hemorrhage will be minimized Outcome: Adequate for Discharge   Problem: Education: Goal: Knowledge of General Education information will improve Description: Including pain rating scale, medication(s)/side effects and non-pharmacologic comfort measures Outcome: Adequate for Discharge   Problem: Health Behavior/Discharge Planning: Goal: Ability to manage health-related needs will improve Outcome:  Adequate for Discharge   Problem: Clinical Measurements: Goal: Ability to maintain clinical measurements within normal limits will improve Outcome: Adequate for Discharge Goal: Will remain free from infection Outcome: Adequate for Discharge Goal: Diagnostic test results will improve Outcome: Adequate for Discharge Goal: Respiratory complications will improve Outcome: Adequate for Discharge Goal: Cardiovascular complication will be avoided Outcome: Adequate for Discharge   Problem: Coping: Goal: Level of anxiety will decrease Outcome: Adequate for Discharge   Problem: Elimination: Goal: Will not experience complications related to bowel motility Outcome: Adequate for Discharge Goal: Will not experience complications related to urinary retention Outcome: Adequate for Discharge   Problem: Pain Managment: Goal: General experience of comfort will improve Outcome: Adequate for Discharge   Problem: Safety: Goal: Ability to remain free from injury will improve Outcome: Adequate for Discharge   Problem: Skin Integrity: Goal: Risk for impaired skin integrity will decrease Outcome: Adequate for Discharge

## 2021-02-09 NOTE — Progress Notes (Signed)
Results for ANUJ, GUNNOE (MRN [id number]) as of 02/09/2021 08:58  Ref. Range 02/08/2021 06:10 02/08/2021 11:23 02/08/2021 16:09 02/08/2021 21:24 02/09/2021 06:03  Glucose-Capillary Latest Ref Range: 70 - 99 mg/dL 273 (H) 199 (H) 295 (H) 345 (H) 213 (H)  Noted that blood sugars continue to be greater than 180 mg/dl.  Recommend increasing Semglee to 25 units daily and increase Novolog meal coverage to 12 units TID if blood sugars continue to be elevated. Titrate dosages as needed.   Harvel Ricks RN BSN CDE Diabetes Coordinator Pager: [phone]  8am-5pm

## 2021-02-09 NOTE — Discharge Summary (Signed)
Physician Discharge Summary  Lance [name] [id number] DOB: [birth year] DOA: 02/04/2021  PCP: Theotis Burrow, MD  Admit date: 02/04/2021 Discharge date: 02/09/2021  Admitted From: home Disposition:  SNF  Recommendations for Outpatient Follow-up:  Follow up with PCP in 1-2 weeks Please obtain BMP/CBC in one week  Home Health: none Equipment/Devices: none  Discharge Condition: stable CODE STATUS: Full code Diet recommendation: diabetic  HPI: Per admitting MD, 75 year old male with past medical history of insulin-dependent diabetes mellitus type 2, previous stroke, hepatitis C, schizophrenia, recent diagnosis of right frontal intraparenchymal hemorrhage in May [id number], diastolic congestive heart failure (Echo 10/2020 EF 70-75% with G1DD), meningioma, hyperlipidemia, peripheral artery disease, cocaine abuse, gastroesophageal reflux disease and benign prostatic hyperplasia who presents to [facility] emergency department for evaluation via EMS due to development of right-sided weakness. History is been obtained both from discussion from staff as well as speaking to patient and review of EMS notes. Patient explains that he started to experience right facial droop anytime between Thursday and Saturday.  Patient is not entirely sure when.  Family believes that it started Saturday.  His facial droop persisted over the following 1 to 2 days and on Sunday the patient began to develop some associated right-sided weakness.  Patient's right-sided weakness began to worsen shortly thereafter leading to the patient having significant difficulty with ambulation and ADLs.  Patient denies any associated headache, changes in vision or confusion. Because of patient's new onset and progressively worsening right-sided weakness EMS was contacted.  Upon EMS arrival, a code stroke was called and patient was promptly brought into [facility] emergency department for evaluation. Upon evaluation in  the emergency department, patient was promptly evaluated by neurology.  tPA was not administered due to the patient being well outside of the appropriate window.  Furthermore patient suffered an intraparenchymal hemorrhage in May.  On neurology evaluation, they could not identify any actual extremity weakness but only right facial weakness.  Noncontrast CT imaging of the head was performed without evidence of acute stroke.  CTA of the head neck was additionally performed.  They have recommended hospitalization for complete stroke work-up.  325 mg of aspirin was administered.  The hospitalist group was then called to assess the patient for admission to the hospital.  Hospital Course / Discharge diagnoses: Principal Problem Right side Bell's palsy -MRI: Mild asymmetric enhancement of the right IAC fundus suggestive of Bell's palsy given the history.  No acute intracranial hemorrhage or infarct.  Stable right frontal meningioma.  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 Discussed with neurology plan for prednisone and acyclovir for 7 days, 4 days remaining.  No need for prednisone taper.  Okay to discontinue aspirin.   Active Problems History of hepatitis C without coma -follow up As an outpatient. History of schizophrenia-Continue with Zyprexa.  Type 2 diabetes with hyperglycemia-A1C:8.6, On Levemir 23 units daily. 10 units novolog meal coverage. SSI GERD- Continue with PPI BPH-Continue with Flomax Chronic diastolic heart failure-compensated HTN-Resume Norvasc.   Sepsis ruled out   Discharge Instructions  Discharge Instructions     Ambulatory referral to Neurology   Complete by: As directed    An appointment is requested in approximately: 6 weeks      Allergies as of 02/09/2021   No Known Allergies      Medication List     STOP taking these medications    lidocaine 5 % Commonly  known as: LIDODERM   tiZANidine 2 MG tablet Commonly known as: ZANAFLEX       TAKE these medications    acetaminophen 325 MG tablet Commonly known as: TYLENOL Take 2 tablets (650 mg total) by mouth every 4 (four) hours as needed for mild pain (or temp > 37.5 C (99.5 F)).   albuterol 108 (90 Base) MCG/ACT inhaler Commonly known as: VENTOLIN HFA Inhale 2 puffs into the lungs every 6 (six) hours as needed for wheezing or shortness of breath.   amLODipine 10 MG tablet Commonly known as: NORVASC Take 1 tablet (10 mg total) by mouth daily.   artificial tears Oint ophthalmic ointment Commonly known as: LACRILUBE Place 1 application into the right eye every 4 (four) hours as needed for dry eyes.   atorvastatin 20 MG tablet Commonly known as: LIPITOR Take 1 tablet (20 mg total) by mouth daily.   busPIRone 5 MG tablet Commonly known as: BUSPAR Take 5 mg by mouth 2 (two) times daily.   citalopram 20 MG tablet Commonly known as: CELEXA Take 20 mg by mouth daily.   cyanocobalamin 1000 MCG tablet Take 1 tablet (1,000 mcg total) by mouth daily.   Diapers & Supplies Misc 1 Units by Does not apply route every 2 (two) hours as needed.   diclofenac Sodium 1 % Gel Commonly known as: VOLTAREN Apply 2 g topically 4 (four) times daily.   docusate sodium 100 MG capsule Commonly known as: Colace Take 1 capsule (100 mg total) by mouth 2 (two) times daily.   ferrous sulfate 325 (65 FE) MG tablet Take 1 tablet (325 mg total) by mouth daily with breakfast.   gabapentin 100 MG capsule Commonly known as: NEURONTIN Take 1 capsule (100 mg total) by mouth 3 (three) times daily.   Levemir FlexTouch 100 UNIT/ML FlexPen Generic drug: insulin detemir Inject 23 Units into the skin at bedtime.   melatonin 3 MG Tabs tablet Take 1 tablet (3 mg total) by mouth at bedtime.   Multi-Vitamin tablet Take 1 tablet by mouth daily.   NovoLOG FlexPen 100 UNIT/ML FlexPen Generic drug: insulin  aspart Inject 10 Units into the skin 3 (three) times daily with meals.   OLANZapine 5 MG tablet Commonly known as: ZYPREXA Take 1 tablet (5 mg total) by mouth at bedtime.   pantoprazole 40 MG tablet Commonly known as: PROTONIX Take 1 tablet (40 mg total) by mouth at bedtime.   polyethylene glycol 17 g packet Commonly known as: MIRALAX / GLYCOLAX Take 17 g by mouth daily as needed for mild constipation.   polyvinyl alcohol 1.4 % ophthalmic solution Commonly known as: Artificial Tears Place 1 drop into both eyes as needed for dry eyes.   predniSONE 20 MG tablet Commonly known as: DELTASONE Take 3 tablets (60 mg total) by mouth daily with breakfast for 4 days.   senna-docusate 8.6-50 MG tablet Commonly known as: Senokot-S Take 1 tablet by mouth 2 (two) times daily.   tamsulosin 0.4 MG Caps capsule Commonly known as: FLOMAX Take 1 capsule (0.4 mg total) by mouth daily after supper.   traMADol 50 MG tablet Commonly known as: ULTRAM Take 1 tablet (50 mg total) by mouth every 12 (twelve) hours as needed for moderate pain.   valACYclovir 1000 MG tablet Commonly known as: VALTREX Take 1 tablet (1,000 mg total) by mouth 3 (three) times daily for 4 days.         Consultations: Neurology   Procedures/Studies:  MR BRAIN W WO CONTRAST  Result Date: 02/05/2021 CLINICAL DATA:  Evaluate for Bell's palsy versus stroke EXAM: MRI HEAD WITHOUT AND WITH CONTRAST TECHNIQUE: Multiplanar, multiecho pulse sequences of the brain and surrounding structures were obtained without and with intravenous contrast. CONTRAST:  53m GADAVIST GADOBUTROL 1 MMOL/ML IV SOLN COMPARISON:  Same day CT/CTA head, brain MRI 10/14/2020 CT head 05/16/2020 FINDINGS: Brain: There is no evidence of acute intracranial hemorrhage, extra-axial fluid collection, or infarct. There is a homogeneously enhancing extra-axial lesion over the right frontal convexity measuring up to 5.9 cm AP in the sagittal plane consistent with  a meningioma, unchanged. The lesion again exerts mass effect on the underlying frontal lobe. There is no midline shift. Extensive FLAIR signal abnormality throughout the subcortical and periventricular white matter is not significantly changed, likely reflecting sequela of advanced chronic white matter microangiopathy. The ventricles are stable in size. A prior intraparenchymal hemorrhage in the right superior frontal gyrus is again seen. Faint curvilinear enhancement at the periphery of the prior hemorrhage site is nonspecific. Extensive chronic microhemorrhages are again seen throughout the cerebral hemispheres concerning for underlying cerebral amyloid angiopathy. There is mild asymmetric enhancement at the fundus of the right IAC (18-15). There is no other abnormal enhancement. Vascular: Normal flow voids. Skull and upper cervical spine: Normal marrow signal. Sinuses/Orbits: There is mild mucosal thickening in the ethmoid air cells. The globes and orbits are unremarkable. Other: None. IMPRESSION: 1. Mild asymmetric enhancement of the right IAC fundus suggestive of Bell's palsy given the history. 2. No acute intracranial hemorrhage or infarct. 3. Stable right frontal meningioma. 4. 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 5. Advanced chronic white matter microangiopathy, unchanged. Electronically Signed   By: PValetta MoleM.D.   On: 02/05/2021 08:54   ECHOCARDIOGRAM COMPLETE BUBBLE STUDY  Result Date: 02/05/2021    ECHOCARDIOGRAM REPORT   Patient Name:   Lance MONJARAZDate of Exam: 02/05/2021 Medical Rec #:  [id number]       Height:       69.0 in Accession #:    [id number]      Weight:       175.0 lb Date of Birth:  712-03-1946       BSA:          1.952 m Patient Age:    89years         BP:           138/79 mmHg Patient Gender: M                HR:           68 bpm. Exam Location:  Inpatient  Procedure: 2D Echo, Cardiac Doppler and Color Doppler Indications:    CVA  History:        Patient has prior history of Echocardiogram examinations, most                 recent 10/15/2020. CHF, Signs/Symptoms:Hypotension; Risk                 Factors:Diabetes.  Sonographer:    MVan TassellReferring Phys: [id number] Felipe 1. Left ventricular ejection fraction, by estimation, is 65 to 70%. The left ventricle has hyperdynamic function. The left ventricle has no regional wall motion abnormalities. There is mild left ventricular hypertrophy. Left ventricular diastolic parameters are consistent with Grade I diastolic dysfunction (impaired relaxation).  2. Right ventricular systolic function  is normal. The right ventricular size is normal. Tricuspid regurgitation signal is inadequate for assessing PA pressure.  3. The mitral valve is normal in structure. No evidence of mitral valve regurgitation. No evidence of mitral stenosis.  4. The aortic valve is tricuspid. Aortic valve regurgitation is not visualized. Mild aortic valve sclerosis is present, with no evidence of aortic valve stenosis.  5. The inferior vena cava is normal in size with greater than 50% respiratory variability, suggesting right atrial pressure of 3 mmHg. FINDINGS  Left Ventricle: Left ventricular ejection fraction, by estimation, is 65 to 70%. The left ventricle has hyperdynamic function. The left ventricle has no regional wall motion abnormalities. The left ventricular internal cavity size was normal in size. There is mild left ventricular hypertrophy. Left ventricular diastolic parameters are consistent with Grade I diastolic dysfunction (impaired relaxation). Right Ventricle: The right ventricular size is normal. No increase in right ventricular wall thickness. Right ventricular systolic function is normal. Tricuspid regurgitation signal is inadequate for assessing PA pressure. Left Atrium: Left atrial size was normal in size. Right Atrium:  Right atrial size was normal in size. Pericardium: Trivial pericardial effusion is present. Mitral Valve: The mitral valve is normal in structure. There is mild calcification of the mitral valve leaflet(s). Mild mitral annular calcification. No evidence of mitral valve regurgitation. No evidence of mitral valve stenosis. Tricuspid Valve: The tricuspid valve is normal in structure. Tricuspid valve regurgitation is not demonstrated. Aortic Valve: The aortic valve is tricuspid. Aortic valve regurgitation is not visualized. Mild aortic valve sclerosis is present, with no evidence of aortic valve stenosis. Aortic valve mean gradient measures 4.0 mmHg. Aortic valve peak gradient measures 7.0 mmHg. Aortic valve area, by VTI measures 4.41 cm. Pulmonic Valve: The pulmonic valve was normal in structure. Pulmonic valve regurgitation is trivial. Aorta: The aortic root is normal in size and structure. Venous: The inferior vena cava is normal in size with greater than 50% respiratory variability, suggesting right atrial pressure of 3 mmHg. IAS/Shunts: No atrial level shunt detected by color flow Doppler.  LEFT VENTRICLE PLAX 2D LVIDd:         3.70 cm     Diastology LVIDs:         2.50 cm     LV e' medial:    6.58 cm/s LV PW:         1.40 cm     LV E/e' medial:  8.7 LV IVS:        1.80 cm     LV e' lateral:   7.94 cm/s LVOT diam:     2.50 cm     LV E/e' lateral: 7.2 LV SV:         127 LV SV Index:   65 LVOT Area:     4.91 cm  LV Volumes (MOD) LV vol d, MOD A4C: 49.5 ml LV vol s, MOD A4C: 15.8 ml LV SV MOD A4C:     49.5 ml LEFT ATRIUM             Index       RIGHT ATRIUM           Index LA diam:        3.20 cm 1.64 cm/m  RA Area:     12.30 cm LA Vol (A2C):   32.2 ml 16.50 ml/m RA Volume:   24.60 ml  12.60 ml/m LA Vol (A4C):   31.5 ml 16.14 ml/m LA Biplane Vol: 33.0 ml 16.91 ml/m  AORTIC  VALVE AV Area (Vmax):    4.31 cm AV Area (Vmean):   3.89 cm AV Area (VTI):     4.41 cm AV Vmax:           132.00 cm/s AV Vmean:           101.000 cm/s AV VTI:            0.287 m AV Peak Grad:      7.0 mmHg AV Mean Grad:      4.0 mmHg LVOT Vmax:         116.00 cm/s LVOT Vmean:        80.100 cm/s LVOT VTI:          0.258 m LVOT/AV VTI ratio: 0.90  AORTA Ao Root diam: 3.60 cm Ao Asc diam:  3.60 cm MITRAL VALVE MV Area (PHT): 2.89 cm    SHUNTS MV E velocity: 57.00 cm/s  Systemic VTI:  0.26 m MV A velocity: 83.10 cm/s  Systemic Diam: 2.50 cm MV E/A ratio:  0.69 Dalton McleanMD Electronically signed by Franki Monte Signature Date/Time: 02/05/2021/3:15:04 PM    Final    CT HEAD CODE STROKE WO CONTRAST  Result Date: 02/05/2021 CLINICAL DATA:  Code stroke.  Acute neurologic deficit EXAM: CT HEAD WITHOUT CONTRAST TECHNIQUE: Contiguous axial images were obtained from the base of the skull through the vertex without intravenous contrast. COMPARISON:  None. FINDINGS: Brain: Unchanged appearance right convexity meningioma. There is generalized atrophy without lobar predilection. There is hypoattenuation of the periventricular white matter, most commonly indicating chronic ischemic microangiopathy. Vascular: No abnormal hyperdensity of the major intracranial arteries or dural venous sinuses. No intracranial atherosclerosis. Skull: The visualized skull base, calvarium and extracranial soft tissues are normal. Sinuses/Orbits: No fluid levels or advanced mucosal thickening of the visualized paranasal sinuses. No mastoid or middle ear effusion. The orbits are normal. ASPECTS [facility] Stroke Program Early CT Score) - Ganglionic level infarction (caudate, lentiform nuclei, internal capsule, insula, M1-M3 cortex): 7 - Supraganglionic infarction (M4-M6 cortex): 3 Total score (0-10 with 10 being normal): 10 IMPRESSION: 1. No acute intracranial abnormality. 2. ASPECTS is 10. 3. Unchanged appearance of right convexity meningioma. These results were communicated to Dr. Roland Rack at 12:28 am on 02/05/2021 by text page via the [employer] messaging system.  Electronically Signed   By: Ulyses Jarred M.D.   On: 02/05/2021 00:28   CT ANGIO HEAD CODE STROKE  Result Date: 02/05/2021 CLINICAL DATA:  Acute neurologic deficit EXAM: CT ANGIOGRAPHY HEAD AND NECK TECHNIQUE: Multidetector CT imaging of the head and neck was performed using the standard protocol during bolus administration of intravenous contrast. Multiplanar CT image reconstructions and MIPs were obtained to evaluate the vascular anatomy. Carotid stenosis measurements (when applicable) are obtained utilizing NASCET criteria, using the distal internal carotid diameter as the denominator. CONTRAST:  58m OMNIPAQUE IOHEXOL 350 MG/ML SOLN COMPARISON:  None. FINDINGS: CTA NECK FINDINGS SKELETON: There is no bony spinal canal stenosis. No lytic or blastic lesion. OTHER NECK: Normal pharynx, larynx and major salivary glands. No cervical lymphadenopathy. Unremarkable thyroid gland. UPPER CHEST: No pneumothorax or pleural effusion. No nodules or masses. AORTIC ARCH: There is calcific atherosclerosis of the aortic arch. There is no aneurysm, dissection or hemodynamically significant stenosis of the visualized portion of the aorta. Conventional 3 vessel aortic branching pattern. The visualized proximal subclavian arteries are widely patent. RIGHT CAROTID SYSTEM: Normal without aneurysm, dissection or stenosis. LEFT CAROTID SYSTEM: Normal without aneurysm, dissection or stenosis. VERTEBRAL ARTERIES: Left dominant configuration.  Both origins are clearly patent. There is no dissection, occlusion or flow-limiting stenosis to the skull base (V1-V3 segments). CTA HEAD FINDINGS POSTERIOR CIRCULATION: --Vertebral arteries: Normal V4 segments. --Inferior cerebellar arteries: Normal. --Basilar artery: Normal. --Superior cerebellar arteries: Normal. --Posterior cerebral arteries (PCA): Normal. ANTERIOR CIRCULATION: --Intracranial internal carotid arteries: Normal. --Anterior cerebral arteries (ACA): Normal. Both A1 segments are  present. Patent anterior communicating artery (a-comm). --Middle cerebral arteries (MCA): Normal. VENOUS SINUSES: As permitted by contrast timing, patent. ANATOMIC VARIANTS: None Review of the MIP images confirms the above findings. IMPRESSION: 1. No emergent large vessel occlusion or hemodynamically significant stenosis. Aortic Atherosclerosis (ICD10-I70.0). Electronically Signed   By: Ulyses Jarred M.D.   On: 02/05/2021 00:41   CT ANGIO NECK CODE STROKE  Result Date: 02/05/2021 CLINICAL DATA:  Acute neurologic deficit EXAM: CT ANGIOGRAPHY HEAD AND NECK TECHNIQUE: Multidetector CT imaging of the head and neck was performed using the standard protocol during bolus administration of intravenous contrast. Multiplanar CT image reconstructions and MIPs were obtained to evaluate the vascular anatomy. Carotid stenosis measurements (when applicable) are obtained utilizing NASCET criteria, using the distal internal carotid diameter as the denominator. CONTRAST:  7m OMNIPAQUE IOHEXOL 350 MG/ML SOLN COMPARISON:  None. FINDINGS: CTA NECK FINDINGS SKELETON: There is no bony spinal canal stenosis. No lytic or blastic lesion. OTHER NECK: Normal pharynx, larynx and major salivary glands. No cervical lymphadenopathy. Unremarkable thyroid gland. UPPER CHEST: No pneumothorax or pleural effusion. No nodules or masses. AORTIC ARCH: There is calcific atherosclerosis of the aortic arch. There is no aneurysm, dissection or hemodynamically significant stenosis of the visualized portion of the aorta. Conventional 3 vessel aortic branching pattern. The visualized proximal subclavian arteries are widely patent. RIGHT CAROTID SYSTEM: Normal without aneurysm, dissection or stenosis. LEFT CAROTID SYSTEM: Normal without aneurysm, dissection or stenosis. VERTEBRAL ARTERIES: Left dominant configuration. Both origins are clearly patent. There is no dissection, occlusion or flow-limiting stenosis to the skull base (V1-V3 segments). CTA HEAD  FINDINGS POSTERIOR CIRCULATION: --Vertebral arteries: Normal V4 segments. --Inferior cerebellar arteries: Normal. --Basilar artery: Normal. --Superior cerebellar arteries: Normal. --Posterior cerebral arteries (PCA): Normal. ANTERIOR CIRCULATION: --Intracranial internal carotid arteries: Normal. --Anterior cerebral arteries (ACA): Normal. Both A1 segments are present. Patent anterior communicating artery (a-comm). --Middle cerebral arteries (MCA): Normal. VENOUS SINUSES: As permitted by contrast timing, patent. ANATOMIC VARIANTS: None Review of the MIP images confirms the above findings. IMPRESSION: 1. No emergent large vessel occlusion or hemodynamically significant stenosis. Aortic Atherosclerosis (ICD10-I70.0). Electronically Signed   By: KUlyses JarredM.D.   On: 02/05/2021 00:41     Subjective: - no chest pain, shortness of breath, no abdominal pain, nausea or vomiting.    Discharge Exam: BP (!) 154/81 (BP Location: Left Arm)   Pulse 80   Temp 98 F (36.7 C) (Oral)   Resp 16   Ht '5\' 9"'$  (1.753 m)   Wt 79.4 kg   SpO2 100%   BMI 25.84 kg/m   General: Pt is alert, awake, not in acute distress Cardiovascular: RRR, S1/S2 +, no rubs, no gallops Respiratory: CTA bilaterally, no wheezing, no rhonchi Abdominal: Soft, NT, ND, bowel sounds + Extremities: no edema, no cyanosis    The results of significant diagnostics from this hospitalization (including imaging, microbiology, ancillary and laboratory) are listed below for reference.     Microbiology: Recent Results (from the past 240 hour(s))  Resp Panel by RT-PCR (Flu A&B, Covid) Nasopharyngeal Swab     Status: None   Collection Time: 02/05/21 12:01 AM   Specimen:  Nasopharyngeal Swab; Nasopharyngeal(NP) swabs in vial transport medium  Result Value Ref Range Status   SARS Coronavirus 2 by RT PCR NEGATIVE NEGATIVE Final    Comment: (NOTE) SARS-CoV-2 target nucleic acids are NOT DETECTED.  The SARS-CoV-2 RNA is generally detectable in  upper respiratory specimens during the acute phase of infection. The lowest concentration of SARS-CoV-2 viral copies this assay can detect is 138 copies/mL. A negative result does not preclude SARS-Cov-2 infection and should not be used as the sole basis for treatment or other patient management decisions. A negative result may occur with  improper specimen collection/handling, submission of specimen other than nasopharyngeal swab, presence of viral mutation(s) within the areas targeted by this assay, and inadequate number of viral copies(<138 copies/mL). A negative result must be combined with clinical observations, patient history, and epidemiological information. The expected result is Negative.  Fact Sheet for Patients:  EntrepreneurPulse.com.au  Fact Sheet for Healthcare Providers:  IncredibleEmployment.be  This test is no t yet approved or cleared by the [place] FDA and  has been authorized for detection and/or diagnosis of SARS-CoV-2 by FDA under an Emergency Use Authorization (EUA). This EUA will remain  in effect (meaning this test can be used) for the duration of the COVID-19 declaration under Section 564(b)(1) of the Act, 21 U.S.C.section 360bbb-3(b)(1), unless the authorization is terminated  or revoked sooner.       Influenza A by PCR NEGATIVE NEGATIVE Final   Influenza B by PCR NEGATIVE NEGATIVE Final    Comment: (NOTE) The Xpert Xpress SARS-CoV-2/FLU/RSV plus assay is intended as an aid in the diagnosis of influenza from Nasopharyngeal swab specimens and should not be used as a sole basis for treatment. Nasal washings and aspirates are unacceptable for Xpert Xpress SARS-CoV-2/FLU/RSV testing.  Fact Sheet for Patients: EntrepreneurPulse.com.au  Fact Sheet for Healthcare Providers: IncredibleEmployment.be  This test is not yet approved or cleared by the [place] FDA and has been  authorized for detection and/or diagnosis of SARS-CoV-2 by FDA under an Emergency Use Authorization (EUA). This EUA will remain in effect (meaning this test can be used) for the duration of the COVID-19 declaration under Section 564(b)(1) of the Act, 21 U.S.C. section 360bbb-3(b)(1), unless the authorization is terminated or revoked.  Performed at [facility], [place] [address] [place], [place] [address]   SARS CORONAVIRUS 2 (TAT 6-24 HRS) Nasopharyngeal Nasopharyngeal Swab     Status: None   Collection Time: 02/08/21  3:58 PM   Specimen: Nasopharyngeal Swab  Result Value Ref Range Status   SARS Coronavirus 2 NEGATIVE NEGATIVE Final    Comment: (NOTE) SARS-CoV-2 target nucleic acids are NOT DETECTED.  The SARS-CoV-2 RNA is generally detectable in upper and lower respiratory specimens during the acute phase of infection. Negative results do not preclude SARS-CoV-2 infection, do not rule out co-infections with other pathogens, and should not be used as the sole basis for treatment or other patient management decisions. Negative results must be combined with clinical observations, patient history, and epidemiological information. The expected result is Negative.  Fact Sheet for Patients: SugarRoll.be  Fact Sheet for Healthcare Providers: https://www.woods-mathews.com/  This test is not yet approved or cleared by the [place] FDA and  has been authorized for detection and/or diagnosis of SARS-CoV-2 by FDA under an Emergency Use Authorization (EUA). This EUA will remain  in effect (meaning this test can be used) for the duration of the COVID-19 declaration under Se ction 564(b)(1) of the Act, 21 U.S.C. section 360bbb-3(b)(1),  unless the authorization is terminated or revoked sooner.  Performed at [facility], [place] [address], [place], [place] [address]      Labs: Basic Metabolic Panel: Recent Labs  Lab 02/05/21 0000  02/05/21 0007  NA 141 143  K 3.6 3.6  CL 108 106  CO2 26  --   GLUCOSE 142* 137*  BUN 11 12  CREATININE 1.12 1.10  CALCIUM 8.6*  --    Liver Function Tests: Recent Labs  Lab 02/05/21 0000  AST 11*  ALT 13  ALKPHOS 95  BILITOT 0.6  PROT 6.5  ALBUMIN 3.0*   CBC: Recent Labs  Lab 02/05/21 0000 02/05/21 0007  WBC 9.9  --   NEUTROABS 7.3  --   HGB 11.1* 10.9*  HCT 35.2* 32.0*  MCV 95.1  --   PLT 206  --    CBG: Recent Labs  Lab 02/08/21 0610 02/08/21 1123 02/08/21 1609 02/08/21 2124 02/09/21 0603  GLUCAP 273* 199* 295* 345* 213*   Hgb A1c No results for input(s): HGBA1C in the last 72 hours. Lipid Profile No results for input(s): CHOL, HDL, LDLCALC, TRIG, CHOLHDL, LDLDIRECT in the last 72 hours. Thyroid function studies No results for input(s): TSH, T4TOTAL, T3FREE, THYROIDAB in the last 72 hours.  Invalid input(s): FREET3 Urinalysis    Component Value Date/Time   COLORURINE YELLOW (A) 12/20/2020 1753   APPEARANCEUR CLEAR (A) 12/20/2020 1753   APPEARANCEUR Clear 10/05/2018 1028   LABSPEC >1.046 (H) 12/20/2020 1753   PHURINE 5.0 12/20/2020 1753   GLUCOSEU 50 (A) 12/20/2020 1753   HGBUR MODERATE (A) 12/20/2020 1753   BILIRUBINUR NEGATIVE 12/20/2020 1753   BILIRUBINUR Negative 10/05/2018 1028   KETONESUR 5 (A) 12/20/2020 1753   PROTEINUR 100 (A) 12/20/2020 1753   NITRITE NEGATIVE 12/20/2020 1753   LEUKOCYTESUR NEGATIVE 12/20/2020 1753    FURTHER DISCHARGE INSTRUCTIONS:   Get Medicines reviewed and adjusted: Please take all your medications with you for your next visit with your Primary MD   Laboratory/radiological data: Please request your Primary MD to go over all hospital tests and procedure/radiological results at the follow up, please ask your Primary MD to get all Hospital records sent to his/her office.   In some cases, they will be blood work, cultures and biopsy results pending at the time of your discharge. Please request that your  primary care M.D. goes through all the records of your hospital data and follows up on these results.   Also Note the following: If you experience worsening of your admission symptoms, develop shortness of breath, life threatening emergency, suicidal or homicidal thoughts you must seek medical attention immediately by calling 911 or calling your MD immediately  if symptoms less severe.   You must read complete instructions/literature along with all the possible adverse reactions/side effects for all the Medicines you take and that have been prescribed to you. Take any new Medicines after you have completely understood and accpet all the possible adverse reactions/side effects.    Do not drive when taking Pain medications or sleeping medications (Benzodaizepines)   Do not take more than prescribed Pain, Sleep and Anxiety Medications. It is not advisable to combine anxiety,sleep and pain medications without talking with your primary care practitioner   Special Instructions: If you have smoked or chewed Tobacco  in the last 2 yrs please stop smoking, stop any regular Alcohol  and or any Recreational drug use.   Wear Seat belts while driving.   Please note: You were  cared for by a hospitalist during your hospital stay. Once you are discharged, your primary care physician will handle any further medical issues. Please note that NO REFILLS for any discharge medications will be authorized once you are discharged, as it is imperative that you return to your primary care physician (or establish a relationship with a primary care physician if you do not have one) for your post hospital discharge needs so that they can reassess your need for medications and monitor your lab values.  Time coordinating discharge: 40 minutes  SIGNED:  Marzetta Board, MD, PhD 02/09/2021, 9:15 AM

## 2021-02-09 NOTE — Progress Notes (Signed)
Per pt request, pts daughter [name] called to inform that he was on his way to the facility. Daughter called and informed.

## 2021-02-09 NOTE — Progress Notes (Signed)
Report given to Mateo Flow, at [facility].

## 2021-04-05 ENCOUNTER — Encounter: Payer: Self-pay | Admitting: Neurology

## 2021-04-05 ENCOUNTER — Ambulatory Visit (INDEPENDENT_AMBULATORY_CARE_PROVIDER_SITE_OTHER): Payer: Medicare HMO | Admitting: Neurology

## 2021-04-05 VITALS — BP 146/90 | HR 84 | Ht 69.0 in

## 2021-04-05 DIAGNOSIS — Z8669 Personal history of other diseases of the nervous system and sense organs: Secondary | ICD-10-CM | POA: Diagnosis not present

## 2021-04-05 NOTE — Progress Notes (Signed)
[facility]  PATIENT: Lance [name] DOB: 03-30-1946  REFERRING CLINICIAN: Derek Jack, MD HISTORY FROM: Patient  REASON FOR VISIT: Bells Palsy    HISTORICAL  CHIEF COMPLAINT:  Chief Complaint  Patient presents with   New Patient (Initial Visit)    RM 12, alone. Internal referral for Bells Palsy. Resides at [facility], cannot remember for how long. Phone: [phone]. Brought to appt by transportation.    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  Patient was admitted for stroke work-up, MRI brain negative for acute stroke but showed evidence of enhancement of the fundus of the right IAC.  He was given a diagnosis of Bell's palsy and started on acyclovir and prednisone.  He reports since being discharged his right facial weakness improve, he is able to close his eyes without difficulty, able to eat without difficulty.  Currently does not have any complaints.  He was brought in today by transport, no staff from the health care center accompanied patient today.    OTHER MEDICAL CONDITIONS: Hypertension, CHF, diabetes, diabetic peripheral neuropathy, intracranial hemorrhage, left hemiparesis, left hemiplegia    REVIEW OF SYSTEMS: Full 14 system review of systems performed and negative with exception of: as noted in the HPI  ALLERGIES: No Known Allergies  HOME MEDICATIONS: Outpatient Medications Prior to Visit  Medication Sig Dispense Refill   acetaminophen (TYLENOL) 325 MG tablet Take 2 tablets (650 mg total) by mouth every 4 (four) hours as needed for mild pain (or temp > 37.5 C (99.5 F)).     albuterol (VENTOLIN HFA) 108 (90 Base) MCG/ACT inhaler Inhale 2  puffs into the lungs every 6 (six) hours as needed for wheezing or shortness of breath. 18 g 0   amLODipine (NORVASC) 10 MG tablet Take 1 tablet (10 mg total) by mouth daily. 30 tablet 0   atorvastatin (LIPITOR) 20 MG tablet Take 1 tablet (20 mg total) by mouth daily. 30 tablet 0   busPIRone (BUSPAR) 5 MG tablet Take 5 mg by mouth 2 (two) times daily.     citalopram (CELEXA) 20 MG tablet Take 20 mg by mouth daily.     cyanocobalamin 1000 MCG tablet Take 1 tablet (1,000 mcg total) by mouth daily. 30 tablet 0   diclofenac Sodium (VOLTAREN) 1 % GEL Apply 2 g topically 4 (four) times daily. 2 g 0   docusate sodium (COLACE) 100 MG capsule Take 1 capsule (100 mg total) by mouth 2 (two) times daily. 60 capsule 2   ferrous sulfate 325 (65 FE) MG tablet Take 1 tablet (325 mg total) by mouth daily with breakfast. 30 tablet 0   gabapentin (NEURONTIN) 100 MG capsule Take 1 capsule (100 mg total) by mouth 3 (three) times daily. 90 capsule 0   insulin aspart (NOVOLOG FLEXPEN) 100 UNIT/ML FlexPen Inject 10 Units into the skin 3 (three) times daily with meals. 15 mL 11   insulin glargine (LANTUS) 100 UNIT/ML injection Inject 25 Units into the skin at bedtime.     insulin lispro (HUMALOG) 100 UNIT/ML injection Inject 12 Units into the skin 3 (three) times daily before meals.     melatonin 3 MG TABS tablet Take 1 tablet (3 mg total) by mouth at bedtime. 30 tablet 0  Multiple Vitamin (MULTI-VITAMIN) tablet Take 1 tablet by mouth daily.     OLANZapine (ZYPREXA) 5 MG tablet Take 1 tablet (5 mg total) by mouth at bedtime. 30 tablet 0   pantoprazole (PROTONIX) 40 MG tablet Take 1 tablet (40 mg total) by mouth at bedtime. 30 tablet 0   polyethylene glycol (MIRALAX / GLYCOLAX) 17 g packet Take 17 g by mouth daily as needed for mild constipation. 30 each 0   polyvinyl alcohol (ARTIFICIAL TEARS) 1.4 % ophthalmic solution Place 1 drop into both eyes as needed for dry eyes. 15 mL 0   senna-docusate (SENOKOT-S) 8.6-50 MG  tablet Take 1 tablet by mouth 2 (two) times daily.     tamsulosin (FLOMAX) 0.4 MG CAPS capsule Take 1 capsule (0.4 mg total) by mouth daily after supper. 30 capsule 0   traMADol (ULTRAM) 50 MG tablet Take 1 tablet (50 mg total) by mouth every 12 (twelve) hours as needed for moderate pain. 10 tablet 0   Diapers & Supplies MISC 1 Units by Does not apply route every 2 (two) hours as needed. 100 Units 0   artificial tears (LACRILUBE) OINT ophthalmic ointment Place 1 application into the right eye every 4 (four) hours as needed for dry eyes.     insulin detemir (LEVEMIR FLEXTOUCH) 100 UNIT/ML FlexPen Inject 23 Units into the skin at bedtime. 15 mL 11   No facility-administered medications prior to visit.    PAST MEDICAL HISTORY: Past Medical History:  Diagnosis Date   CVA (cerebral vascular accident) ([place])    Diabetes mellitus without complication ([place])    Hepatitis C    Schizophrenia ([place])     PAST SURGICAL HISTORY: Past Surgical History:  Procedure Laterality Date   COLONOSCOPY WITH PROPOFOL N/A 08/15/2020   Procedure: COLONOSCOPY WITH PROPOFOL;  Surgeon: Lucilla Lame, MD;  Location: [facility] END[facility]COPY;  Service: Endoscopy;  Laterality: N/A;   ESOPHAGOGASTRODUODENOSCOPY (EGD) WITH PROPOFOL N/A 10/10/2016   Procedure: ESOPHAGOGASTRODUODENOSCOPY (EGD) WITH PROPOFOL;  Surgeon: Lucilla Lame, MD;  Location: [facility] ENDOSCOPY;  Service: Endoscopy;  Laterality: N/A;   HERNIA REPAIR     VISCERAL ARTERY INTERVENTION N/A 10/11/2016   Procedure: Visceral Artery Intervention;  Surgeon: Algernon Huxley, MD;  Location: [facility];  Service: Cardiovascular;  Laterality: N/A;    FAMILY HISTORY: Family History  Problem Relation Age of Onset   Diabetes Sister     SOCIAL HISTORY: Social History   Socioeconomic History   Marital status: Widowed    Spouse name: Not on file   Number of children: Not on file   Years of education: Not on file   Highest education level: Not on file  Occupational History    Not on file  Tobacco Use   Smoking status: Every Day    Packs/day: 0.25    Years: 10.00    Pack years: 2.50    Types: Cigarettes   Smokeless tobacco: Never  Vaping Use   Vaping Use: Never used  Substance and Sexual Activity   Alcohol use: Not Currently    Alcohol/week: 10.0 standard drinks    Types: 10 Shots of liquor per week   Drug use: No   Sexual activity: Yes    Birth control/protection: None  Other Topics Concern   Not on file  Social History Narrative   Not on file   Social Determinants of Health   Financial Resource Strain: Not on file  Food Insecurity: Not on file  Transportation Needs: Not on file  Physical Activity:  Not on file  Stress: Not on file  Social Connections: Not on file  Intimate Partner Violence: Not on file     PHYSICAL EXAM  GENERAL EXAM/CONSTITUTIONAL: Vitals:  Vitals:   04/05/21 1028  BP: (!) 146/90  Pulse: 84  Height: 5\' 9"  (1.753 m)   Body mass index is 25.84 kg/m. Wt Readings from Last 3 Encounters:  02/05/21 175 lb (79.4 kg)  12/20/20 207 lb 0.2 oz (93.9 kg)  12/18/20 207 lb (93.9 kg)   Patient is in no distress; well developed, nourished and groomed; neck is supple  EYES: Pupils round and reactive to light, Visual fields full to confrontation, Extraocular movements intacts,   MUSCULOSKELETAL: Gait, strength, tone, movements noted in Neurologic exam below  NEUROLOGIC: MENTAL STATUS:  No flowsheet data found. awake, alert, oriented to person, place and time Able to calculate the number of quarters in $1.75, has trouble naming the days of the week backwards. language fluent, comprehension intact, naming intact   CRANIAL NERVE:  2nd, 3rd, 4th, 6th - pupils equal and reactive to light, visual fields full to confrontation, extraocular muscles intact, no nystagmus 5th - facial sensation symmetric 7th - facial strength symmetric 8th - hearing intact 9th - palate elevates symmetrically, uvula midline 11th - shoulder  shrug symmetric 12th - tongue protrusion midline  MOTOR:  normal bulk and tone, full strength in the BUE, BLE.  There are pronation on right without drift.  Able to fully close eyes, there is mild asymmetry with decreased nasolabial fold on the right,  SENSORY:  normal and symmetric to light touch, pinprick  COORDINATION:  finger-nose-finger, fine finger movements normal  REFLEXES:  deep tendon reflexes present and symmetric  GAIT/STATION:  Deferred    DIAGNOSTIC DATA (LABS, IMAGING, TESTING) - I reviewed patient records, labs, notes, testing and imaging myself where available.  Lab Results  Component Value Date   WBC 9.9 02/05/2021   HGB 10.9 (L) 02/05/2021   HCT 32.0 (L) 02/05/2021   MCV 95.1 02/05/2021   PLT 206 02/05/2021      Component Value Date/Time   NA 143 02/05/2021 0007   NA 139 03/02/2019 1630   K 3.6 02/05/2021 0007   CL 106 02/05/2021 0007   CO2 26 02/05/2021 0000   GLUCOSE 137 (H) 02/05/2021 0007   BUN 12 02/05/2021 0007   BUN 17 03/02/2019 1630   CREATININE 1.10 02/05/2021 0007   CALCIUM 8.6 (L) 02/05/2021 0000   PROT 6.5 02/05/2021 0000   PROT 7.9 03/02/2019 1630   ALBUMIN 3.0 (L) 02/05/2021 0000   ALBUMIN 4.4 03/02/2019 1630   AST 11 (L) 02/05/2021 0000   ALT 13 02/05/2021 0000   ALKPHOS 95 02/05/2021 0000   BILITOT 0.6 02/05/2021 0000   BILITOT 0.3 03/02/2019 1630   GFRNONAA >60 02/05/2021 0000   GFRAA >60 10/28/2019 1708   Lab Results  Component Value Date   CHOL 159 10/15/2020   HDL 26 (L) 10/15/2020   LDLCALC 59 10/15/2020   TRIG 369 (H) 10/15/2020   CHOLHDL 6.1 10/15/2020   Lab Results  Component Value Date   HGBA1C 8.6 (H) 02/05/2021   Lab Results  Component Value Date   VITAMINB12 294 05/15/2020   Lab Results  Component Value Date   TSH 1.225 05/15/2020    MRI Brain:  1. Mild asymmetric enhancement of the right IAC fundus suggestive of Bell's palsy given the history. 2. No acute intracranial hemorrhage or  infarct. 3. Stable right frontal meningioma. 4. 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 5. Advanced chronic white matter microangiopathy, unchanged    ASSESSMENT AND PLAN  75 y.o. 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  Patient was treated with acyclovir and prednisone and discharged home.  Currently he reported his symptom improved, the right facial weakness improved, eating is better, closing his eyes is better, no additional complaint of a concern.  From a neurological standpoint no additional work-up needed, he will continue his current medications and return if worse.   1. History of Bell's palsy     PLAN: Continue current medications.  Return if worse  No orders of the defined types were placed in this encounter.   No orders of the defined types were placed in this encounter.   Return if symptoms worsen or fail to improve.    Alric Ran, MD 04/05/2021, 11:22 AM  [facility] [address], [place], [place] [address] [phone]

## 2021-04-05 NOTE — Patient Instructions (Signed)
Continue current medications Return if worse 

## 2021-11-29 ENCOUNTER — Ambulatory Visit (INDEPENDENT_AMBULATORY_CARE_PROVIDER_SITE_OTHER): Payer: Medicare HMO | Admitting: Neurology

## 2021-11-29 ENCOUNTER — Encounter: Payer: Self-pay | Admitting: Neurology

## 2021-11-29 VITALS — BP 127/79 | HR 84 | Ht 69.0 in

## 2021-11-29 DIAGNOSIS — D329 Benign neoplasm of meninges, unspecified: Secondary | ICD-10-CM

## 2021-11-29 DIAGNOSIS — G44209 Tension-type headache, unspecified, not intractable: Secondary | ICD-10-CM | POA: Diagnosis not present

## 2021-11-29 NOTE — Patient Instructions (Addendum)
Increase nightly Gabapentin to 300 mg, continue your other medications  Tylenol/Motrin as needed for the headaches  Repeat MRI Brain for evaluation of the meningioma  Follow up in a year

## 2021-12-03 ENCOUNTER — Telehealth: Payer: Self-pay | Admitting: Neurology

## 2021-12-10 IMAGING — CT CT HEAD W/O CM
3 series · 15 of 47 positions shown, 18 images · non-contrast
Comparison: March 22, 2020

CLINICAL DATA: Neck pain and left leg weakness.

EXAM:
CT HEAD WITHOUT CONTRAST
TECHNIQUE: Contiguous axial images were obtained from the base of the skull
through the vertex without intravenous contrast.

[Series 2: head wo · axial · 0.47mm/px · z∈[-146,-6]mm · 9 of 34 slices shown, 12 images]
[im 3/34  brain]
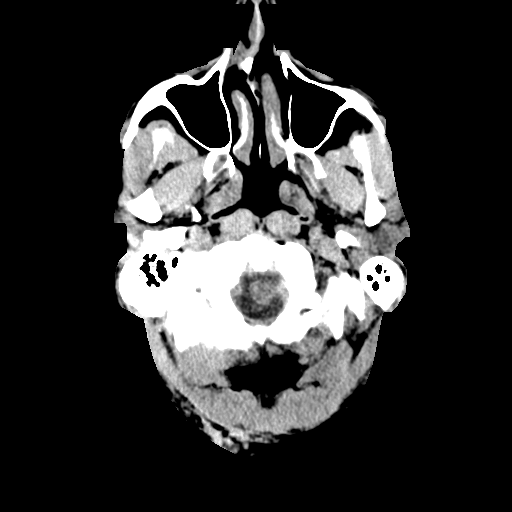
[im 3/34  bone]
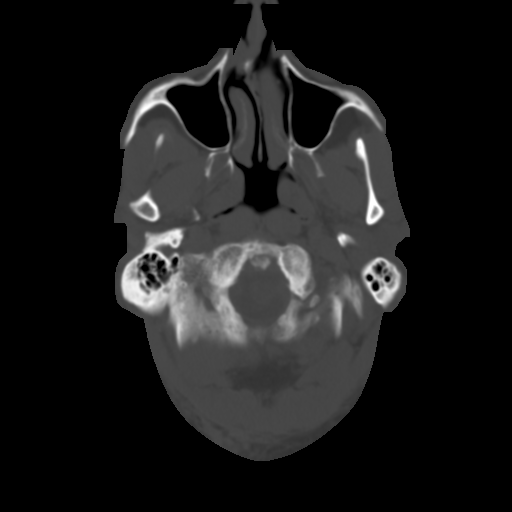
[im 6/34  brain]
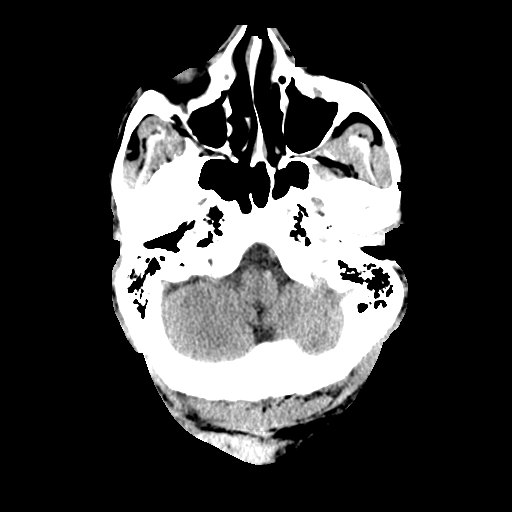
[im 10/34  brain]
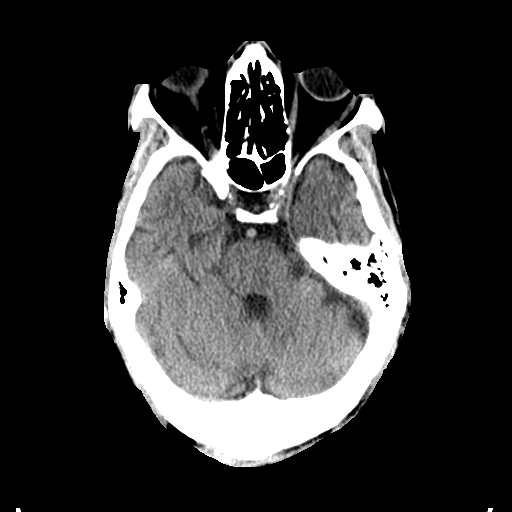
[im 13/34  brain]
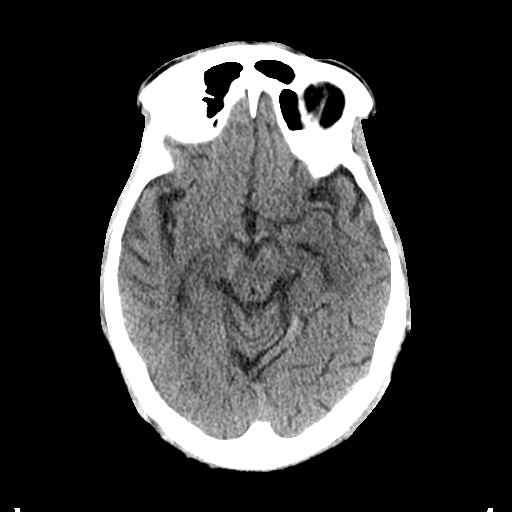
[im 18/34  brain]
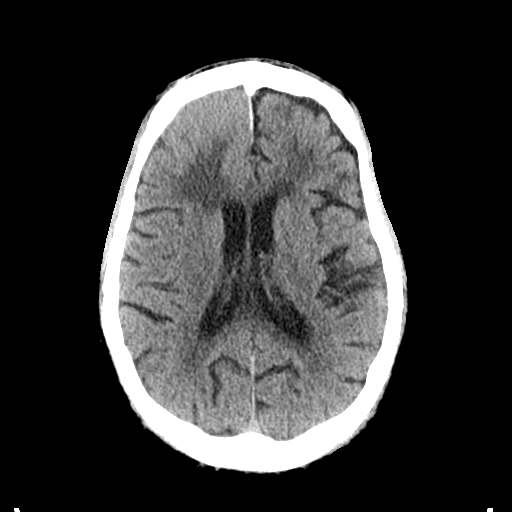
[im 18/34  bone]
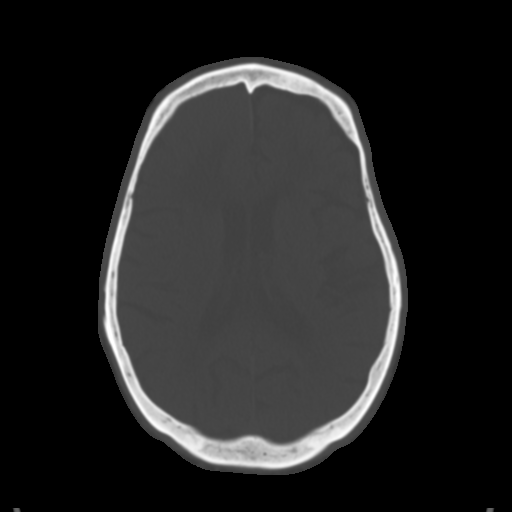
[im 21/34  brain]
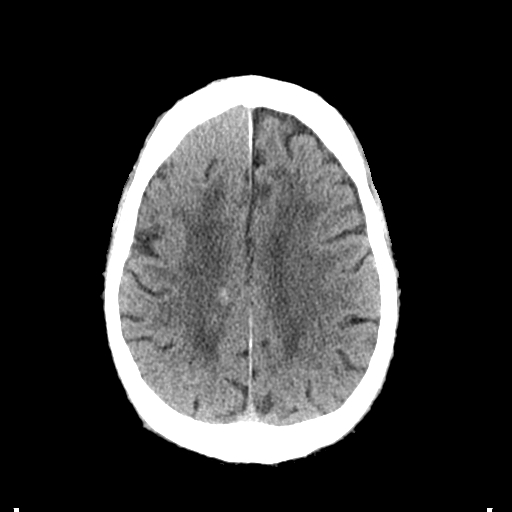
[im 24/34  brain]
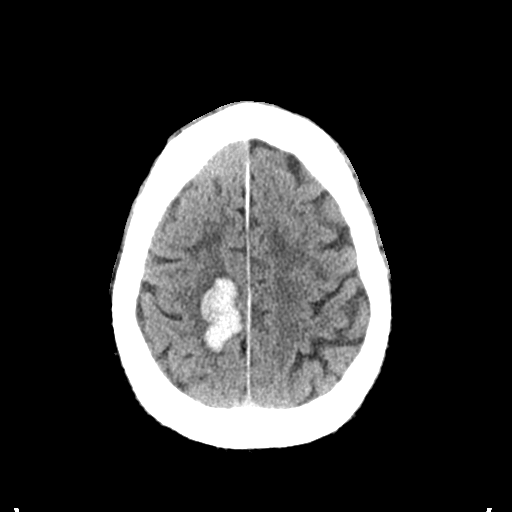
[im 28/34  brain]
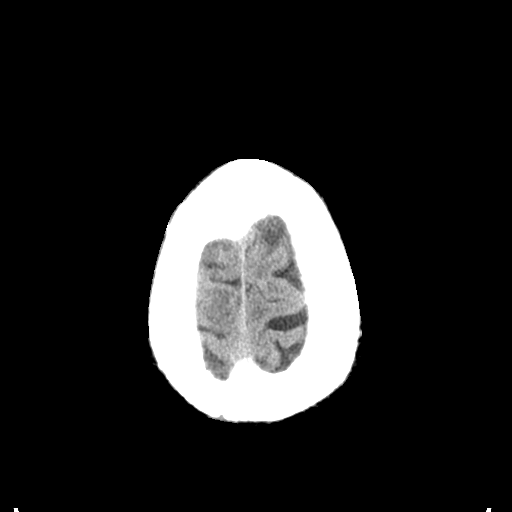
[im 31/34  brain]
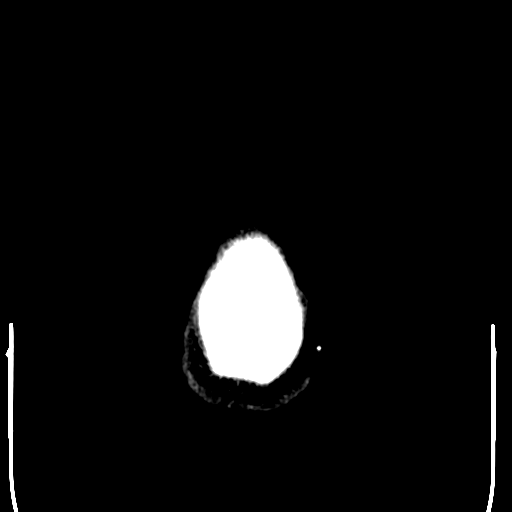
[im 31/34  bone]
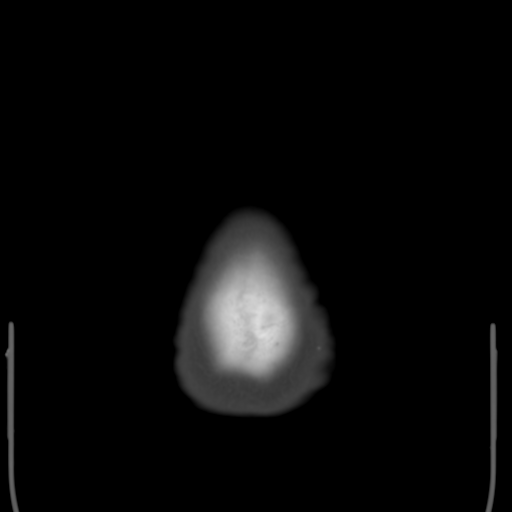

[Series 4: coronal soft tissue · coronal · 0.33mm/px · 3 of 77 slices shown]
[im 26/77  brain]
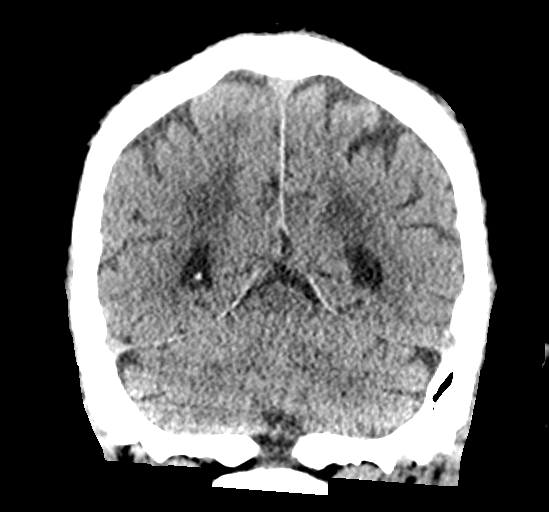
[im 34/77  brain]
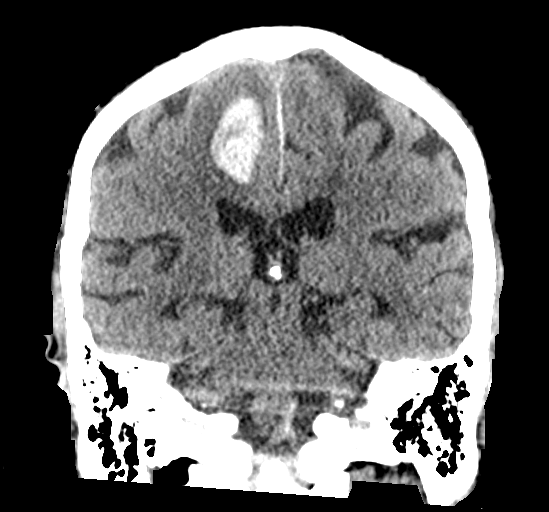
[im 43/77  brain]
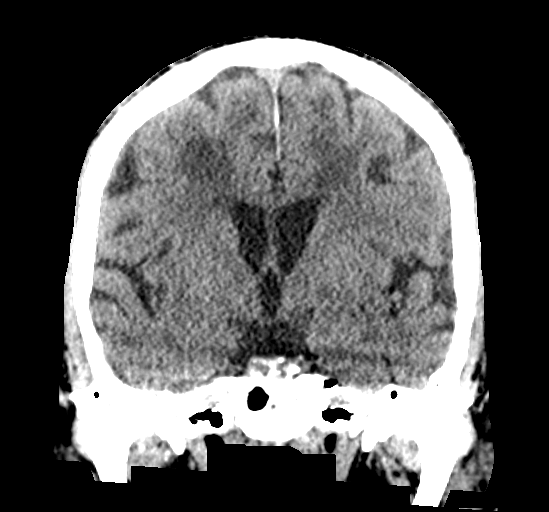

[Series 5: sagittal soft tissue · sagittal · 0.33mm/px · 3 of 60 slices shown]
[im 20/60  brain]
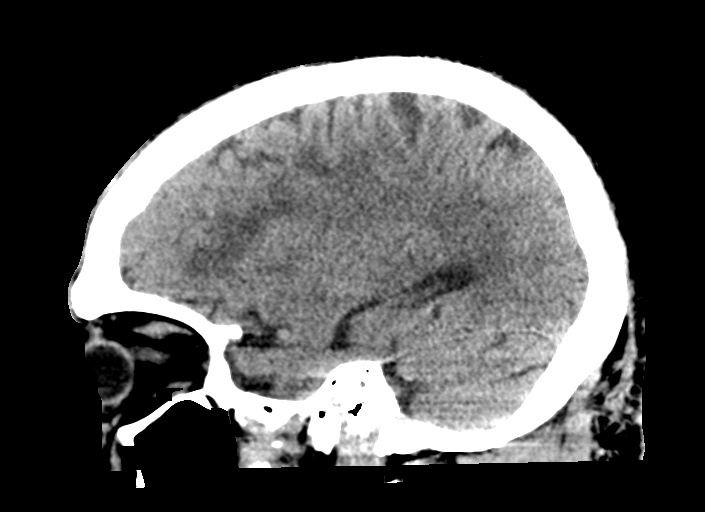
[im 30/60  brain]
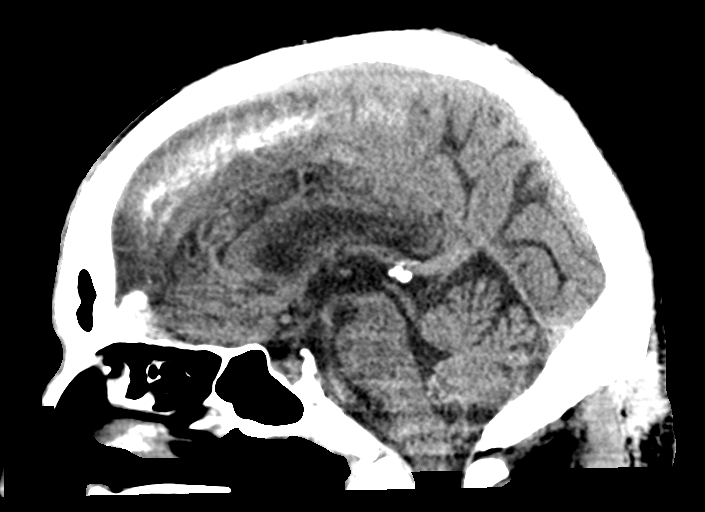
[im 40/60  brain]
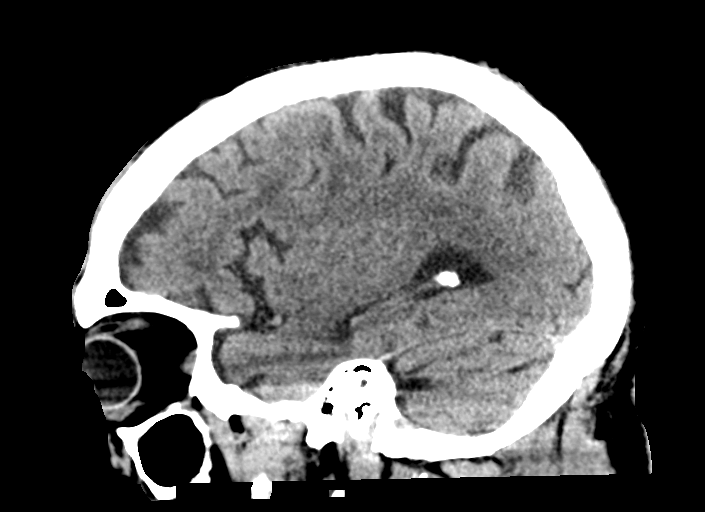

[15 of 47 positions shown; findings below may reference images not displayed]

FINDINGS: Brain: There is mild cerebral atrophy with widening of the
extra-axial spaces and ventricular dilatation.
There are areas of decreased attenuation within the white matter
tracts of the supratentorial brain, consistent with microvascular
disease changes.

A 3.4 cm x 1.7 cm x 2.8 cm lobulated acute parenchymal bleed is seen
within the parasagittal portion of the frontoparietal region on the
right. Very mild mass effect is seen on the adjacent sulci with no
evidence of midline shift.

A stable meningioma is seen along the anterior aspect of the
anterior cranial fossa with a mild amount of stable mass-effect
noted on the right frontal lobe.

Vascular: No hyperdense vessel or unexpected calcification.

Skull: Normal. Negative for fracture or focal lesion.

Sinuses/Orbits: No acute finding.

Other: Persistent moderate to marked severity mildly dense
subcutaneous inflammatory fat stranding is seen along the posterior
fossa on the right.
IMPRESSION: 1. 3.4 cm x 1.7 cm x 2.8 cm acute right frontoparietal parenchymal
bleed, as described above. MRI correlation is recommended.
2. Stable right anterior cranial fossa meningioma.

## 2021-12-30 ENCOUNTER — Emergency Department: Payer: Medicare HMO

## 2021-12-30 ENCOUNTER — Other Ambulatory Visit: Payer: Self-pay

## 2021-12-30 DIAGNOSIS — Y92129 Unspecified place in nursing home as the place of occurrence of the external cause: Secondary | ICD-10-CM | POA: Diagnosis not present

## 2021-12-30 DIAGNOSIS — D72829 Elevated white blood cell count, unspecified: Secondary | ICD-10-CM | POA: Diagnosis not present

## 2021-12-30 DIAGNOSIS — W19XXXA Unspecified fall, initial encounter: Secondary | ICD-10-CM | POA: Diagnosis not present

## 2021-12-30 DIAGNOSIS — S51011A Laceration without foreign body of right elbow, initial encounter: Secondary | ICD-10-CM | POA: Insufficient documentation

## 2021-12-30 DIAGNOSIS — W01198A Fall on same level from slipping, tripping and stumbling with subsequent striking against other object, initial encounter: Secondary | ICD-10-CM | POA: Insufficient documentation

## 2021-12-30 DIAGNOSIS — D649 Anemia, unspecified: Secondary | ICD-10-CM | POA: Diagnosis not present

## 2021-12-30 DIAGNOSIS — E119 Type 2 diabetes mellitus without complications: Secondary | ICD-10-CM | POA: Diagnosis not present

## 2021-12-30 DIAGNOSIS — R531 Weakness: Secondary | ICD-10-CM | POA: Insufficient documentation

## 2021-12-30 DIAGNOSIS — D420 Neoplasm of uncertain behavior of cerebral meninges: Secondary | ICD-10-CM | POA: Diagnosis not present

## 2021-12-30 DIAGNOSIS — S0990XA Unspecified injury of head, initial encounter: Secondary | ICD-10-CM | POA: Diagnosis present

## 2021-12-30 DIAGNOSIS — S066X0A Traumatic subarachnoid hemorrhage without loss of consciousness, initial encounter: Secondary | ICD-10-CM | POA: Diagnosis not present

## 2021-12-30 LAB — BASIC METABOLIC PANEL WITH GFR
Anion gap: 6 (ref 5–15)
BUN: 17 mg/dL (ref 8–23)
CO2: 26 mmol/L (ref 22–32)
Calcium: 8.7 mg/dL — ABNORMAL LOW (ref 8.9–10.3)
Chloride: 105 mmol/L (ref 98–111)
Creatinine, Ser: 1.34 mg/dL — ABNORMAL HIGH (ref 0.61–1.24)
GFR, Estimated: 56 mL/min — ABNORMAL LOW
Glucose, Bld: 190 mg/dL — ABNORMAL HIGH (ref 70–99)
Potassium: 4 mmol/L (ref 3.5–5.1)
Sodium: 137 mmol/L (ref 135–145)

## 2021-12-30 LAB — CBC
HCT: 35.8 % — ABNORMAL LOW (ref 39.0–52.0)
Hemoglobin: 11.6 g/dL — ABNORMAL LOW (ref 13.0–17.0)
MCH: 29.1 pg (ref 26.0–34.0)
MCHC: 32.4 g/dL (ref 30.0–36.0)
MCV: 89.7 fL (ref 80.0–100.0)
Platelets: 227 K/uL (ref 150–400)
RBC: 3.99 MIL/uL — ABNORMAL LOW (ref 4.22–5.81)
RDW: 12.5 % (ref 11.5–15.5)
WBC: 11 K/uL — ABNORMAL HIGH (ref 4.0–10.5)
nRBC: 0 % (ref 0.0–0.2)

## 2021-12-30 LAB — TROPONIN I (HIGH SENSITIVITY)
Troponin I (High Sensitivity): 6 ng/L (ref <18)
Troponin I (High Sensitivity): 6 ng/L

## 2021-12-30 NOTE — ED Triage Notes (Signed)
Arrived via EMS from [facility]. Patient states that he fell while trying to get into wheelchair. Reports hitting head and right arm. Reports head, neck and right elbow pain. Reports dizziness. Denies chest pain or shortness of breath. AOX4.

## 2021-12-30 NOTE — ED Triage Notes (Signed)
First Nurse Note;  Pt via EMS from [facility]. Pt had a unwitnessed fall. Pt was trying to get into his wheelchair. Pt states he hit his head of the bedside table. Denies LOC. Denies blood thinners. Pt c/o neck pain and R elbow pain. Pt is A&Ox4 and NAD  242 CBG  126/77 82 HR 99% on RA 22 RR

## 2021-12-30 NOTE — ED Provider Triage Note (Signed)
Emergency Medicine Provider Triage Evaluation Note  Lance [name] , a 76 y.o. male  was evaluated in triage.  Patient suffered an unwitnessed fall at [facility] just prior to arrival.  Patient states he hit his head on the table and is complaining of headache and neck pain.  Also having some right elbow pain.  Denies any LOC, nausea, vomiting.  No lower extremity discomfort.  Review of Systems  Positive: Headache, neck pain, right elbow pain, dizziness Negative: Chest pain, shortness of breath  Physical Exam  BP 121/79 (BP Location: Left Arm)   Pulse 94   Temp 98.9 F (37.2 C) (Oral)   Resp 16   Ht '5\' 9"'$  (1.753 m)   Wt 81.6 kg   SpO2 97%   BMI 26.58 kg/m  Gen:   Awake, no distress   Resp:  Normal effort  MSK:   Moves extremities without difficulty  Other:    Medical Decision Making  Medically screening exam initiated at 7:17 PM.  Appropriate orders placed.  Celso Amy was informed that the remainder of the evaluation will be completed by another provider, this initial triage assessment does not replace that evaluation, and the importance of remaining in the ED until their evaluation is complete.     Duanne Guess, [place] 12/30/21 1920

## 2021-12-31 ENCOUNTER — Emergency Department: Payer: Medicare HMO

## 2021-12-31 ENCOUNTER — Emergency Department
Admission: EM | Admit: 2021-12-31 | Discharge: 2021-12-31 | Disposition: A | Discharge status: Skilled Nursing Facility | Payer: Medicare HMO | Attending: Emergency Medicine | Admitting: Emergency Medicine

## 2021-12-31 DIAGNOSIS — D420 Neoplasm of uncertain behavior of cerebral meninges: Secondary | ICD-10-CM

## 2021-12-31 DIAGNOSIS — S066X0A Traumatic subarachnoid hemorrhage without loss of consciousness, initial encounter: Secondary | ICD-10-CM

## 2021-12-31 DIAGNOSIS — W19XXXA Unspecified fall, initial encounter: Secondary | ICD-10-CM

## 2021-12-31 LAB — PROTIME-INR
INR: 1.1 (ref 0.8–1.2)
Prothrombin Time: 14.5 s (ref 11.4–15.2)

## 2021-12-31 LAB — CBG MONITORING, ED
Glucose-Capillary: 162 mg/dL — ABNORMAL HIGH (ref 70–99)
Glucose-Capillary: 231 mg/dL — ABNORMAL HIGH (ref 70–99)

## 2021-12-31 LAB — APTT: aPTT: 32 seconds (ref 24–36)

## 2021-12-31 MED ORDER — OXYCODONE-ACETAMINOPHEN 5-325 MG PO TABS: 1.0000 | ORAL_TABLET | Freq: Four times a day (QID) | ORAL | 0 refills | Status: AC | PRN

## 2021-12-31 MED ORDER — PROCHLORPERAZINE EDISYLATE 10 MG/2ML IJ SOLN
5.0000 mg | Freq: Once | INTRAMUSCULAR | Status: AC
  Administered 2021-12-31: 5 mg via INTRAVENOUS
  Filled 2021-12-31: qty 2

## 2021-12-31 MED ORDER — ACETAMINOPHEN 500 MG PO TABS
1000.0000 mg | ORAL_TABLET | ORAL | Status: AC
  Administered 2021-12-31: 1000 mg via ORAL
  Filled 2021-12-31: qty 2

## 2021-12-31 MED ORDER — ONDANSETRON 4 MG PO TBDP: 4.0000 mg | ORAL_TABLET | Freq: Three times a day (TID) | ORAL | 0 refills | Status: DC | PRN

## 2021-12-31 MED ORDER — SODIUM CHLORIDE 0.9 % IV BOLUS
500.0000 mL | Freq: Once | INTRAVENOUS | Status: AC
  Administered 2021-12-31: 500 mL via INTRAVENOUS

## 2021-12-31 MED ORDER — MORPHINE SULFATE (PF) 4 MG/ML IV SOLN
4.0000 mg | Freq: Once | INTRAVENOUS | Status: AC
  Administered 2021-12-31: 4 mg via INTRAVENOUS
  Filled 2021-12-31: qty 1

## 2021-12-31 MED ORDER — SODIUM CHLORIDE 0.9 % IV SOLN: INTRAVENOUS | Status: DC

## 2021-12-31 MED ORDER — DIPHENHYDRAMINE HCL 50 MG/ML IJ SOLN
12.5000 mg | Freq: Once | INTRAMUSCULAR | Status: AC
Start: 2021-12-31 — End: 2021-12-31
  Administered 2021-12-31: 12.5 mg via INTRAVENOUS
  Filled 2021-12-31: qty 1

## 2021-12-31 MED ORDER — MORPHINE SULFATE (PF) 2 MG/ML IV SOLN
2.0000 mg | Freq: Once | INTRAVENOUS | Status: AC
  Administered 2021-12-31: 2 mg via INTRAVENOUS
  Filled 2021-12-31: qty 1

## 2021-12-31 NOTE — ED Notes (Signed)
Report to reina, rn.  

## 2021-12-31 NOTE — ED Notes (Signed)
Per Dr Ellender Hose, HA pain is to be reassessed again after last meds that were just given and depending on resolution of HA pain, pt will either go for MRI or discharge.

## 2021-12-31 NOTE — Consult Note (Signed)
Neurosurgery-New Consultation Evaluation 12/31/2021 Lance [name] [id number]  Identifying Statement: Lance [name] is a 76 y.o. male from [place] [id number] with a history of hep C, schizophrenia, DM, and prior stroke.  Physician Requesting Consultation: No ref. provider found  History of Present Illness: Lance [name] is a 76 y.o presenting to the ER overnight after a fall while transferring to his wheelchair resulting in hitting his head on his bedside table. He admits to right sided temporal headache since his fall but denies any LOC or changes in vision.   Past Medical History:  Past Medical History:  Diagnosis Date   CVA (cerebral vascular accident) ([place])    Diabetes mellitus without complication ([place])    Hepatitis C    Schizophrenia ([place])     Social History: Social History   Socioeconomic History   Marital status: Widowed    Spouse name: Not on file   Number of children: Not on file   Years of education: Not on file   Highest education level: Not on file  Occupational History   Not on file  Tobacco Use   Smoking status: Every Day    Packs/day: 0.25    Years: 10.00    Total pack years: 2.50    Types: Cigarettes   Smokeless tobacco: Never  Vaping Use   Vaping Use: Never used  Substance and Sexual Activity   Alcohol use: Not Currently    Alcohol/week: 10.0 standard drinks of alcohol    Types: 10 Shots of liquor per week   Drug use: No   Sexual activity: Yes    Birth control/protection: None  Other Topics Concern   Not on file  Social History Narrative   Not on file   Social Determinants of Health   Financial Resource Strain: Not on file  Food Insecurity: Not on file  Transportation Needs: Not on file  Physical Activity: Not on file  Stress: Not on file  Social Connections: Not on file  Intimate Partner Violence: Not on file   Living arrangements (living alone, with partner): assisted living   Family History: Family History  Problem  Relation Age of Onset   Diabetes Sister     Review of Systems:  Review of Systems - General ROS: Negative Psychological ROS: Negative Ophthalmic ROS: Negative ENT ROS: Negative Hematological and Lymphatic ROS: Negative  Endocrine ROS: Negative Respiratory ROS: Negative Cardiovascular ROS: Negative Gastrointestinal ROS: Negative Genito-Urinary ROS: Negative Musculoskeletal ROS: Negative Neurological ROS: Negative Dermatological ROS: Negative  Physical Exam: BP (!) 157/92   Pulse 73   Temp 97.8 F (36.6 C) (Oral)   Resp 16   Ht '5\' 9"'$  (1.753 m)   Wt 81.6 kg   SpO2 98%   BMI 26.58 kg/m  Body mass index is 26.58 kg/m. Body surface area is 1.99 meters squared. General appearance: Alert, cooperative, in no acute distress Head: Normocephalic, atraumatic Eyes: Normal, EOM intact Oropharynx: Moist without lesions Neck: Supple, no tenderness Heart: Normal, regular rate and rhythm, without murmur Lungs: good air exchange Abdomen: Soft, nondistended Ext: No edema in LE bilaterally, good distal pulses  Neurologic exam:  Mental status: alertness: alert, orientation: person, place, but says 2002 when asked the year affect: normal Speech: fluent and clear Cranial nerves:  II: Visual fields are full by confrontation, no ptosis III/IV/VI: extra-ocular motions intact bilaterally V/VII:no evidence of facial droop or weakness and facial sensation intact VIII: hearing normal XI: trapezius strength symmetric,  sternocleidomastoid strength symmetric XII: tongue strength symmetric  Motor:strength  symmetric 5/5 throughout except 4-/5 left HF (baseline per patient)  Sensory: intact to light touch in all extremities Reflexes: 2+ and symmetric bilaterally for arms and legs Coordination: intact finger to nose Gait: not tested   Laboratory: Results for orders placed or performed during the hospital encounter of 37/90/24  Basic metabolic panel  Result Value Ref Range   Sodium 137 135 -  145 mmol/L   Potassium 4.0 3.5 - 5.1 mmol/L   Chloride 105 98 - 111 mmol/L   CO2 26 22 - 32 mmol/L   Glucose, Bld 190 (H) 70 - 99 mg/dL   BUN 17 8 - 23 mg/dL   Creatinine, Ser 1.34 (H) 0.61 - 1.24 mg/dL   Calcium 8.7 (L) 8.9 - 10.3 mg/dL   GFR, Estimated 56 (L) >60 mL/min   Anion gap 6 5 - 15  CBC  Result Value Ref Range   WBC 11.0 (H) 4.0 - 10.5 K/uL   RBC 3.99 (L) 4.22 - 5.81 MIL/uL   Hemoglobin 11.6 (L) 13.0 - 17.0 g/dL   HCT 35.8 (L) 39.0 - 52.0 %   MCV 89.7 80.0 - 100.0 fL   MCH 29.1 26.0 - 34.0 pg   MCHC 32.4 30.0 - 36.0 g/dL   RDW 12.5 11.5 - 15.5 %   Platelets 227 150 - 400 K/uL   nRBC 0.0 0.0 - 0.2 %  Protime-INR  Result Value Ref Range   Prothrombin Time 14.5 11.4 - 15.2 seconds   INR 1.1 0.8 - 1.2  APTT  Result Value Ref Range   aPTT 32 24 - 36 seconds  POC CBG, ED  Result Value Ref Range   Glucose-Capillary 162 (H) 70 - 99 mg/dL  POC CBG, ED  Result Value Ref Range   Glucose-Capillary 231 (H) 70 - 99 mg/dL  Troponin I (High Sensitivity)  Result Value Ref Range   Troponin I (High Sensitivity) 6 <18 ng/L  Troponin I (High Sensitivity)  Result Value Ref Range   Troponin I (High Sensitivity) 6 <18 ng/L   I personally reviewed labs  Imaging:  I personally reviewed radiology studies to include:   Head 12/31/21 IMPRESSION: 1. Trace amount of subarachnoid hemorrhage in the right frontal region, similar to prior examination. 2. Large right anterior meningioma redemonstrated. 3. Mild cerebral and cerebellar atrophy with chronic microvascular ischemic changes in the cerebral white matter, as above.     Electronically Signed   By: Vinnie Langton M.D.   On: 12/31/2021 06:59  CT C spine 12/31/21 IMPRESSION: 1. Small focus of subarachnoid hemorrhage at the medial superior right frontal lobe. 2. Unchanged large right anterior convexity meningioma. 3. No acute fracture or static subluxation of the cervical spine.   Electronically Signed: By: Ulyses Jarred M.D. On: 12/31/2021 01:51  Impression/Plan:     1.  Diagnosis:  Small traumatic SAH and redemonstration of right anterior convexity meningioma.    2.  Plan - no plan for acute neurosurgerical intervention. Given patient's age, comorbidities and lack of LOC would likely favor avoiding AEDs - MRI brain pending for meningioma. Will follow up outpatient in 12 months to review stability of mass - Please call with any questions or concerns.  Cooper Render PA-C Neurosurgery

## 2021-12-31 NOTE — ED Notes (Signed)
Pt cleansed of incontinent urine. Pt provided with meal tray and po fluids with permission of dr. Jacqualine Code.

## 2021-12-31 NOTE — ED Notes (Signed)
Pt watching tv in room in no acute distress.

## 2021-12-31 NOTE — ED Provider Notes (Signed)
Patient here with fall, SAH. Repeat CT scan shows no worsening of bleed. Pt not on anticoagulation. Pt c/o HA in ED at time of sign out, so NSGY will see in ED. Likely traumatic headache, will tx supportively. No neuro deficits.  HA improving. NSGY has evaluated. Discussed with Dr. Izora Ribas, will treat symptomatically but no need for additional imaging, work-up at this time. Pt has an MRI scheduled for outpt but this does not need to be done emergently.  Sx improving. VS remain stable. Pt is awake, alert, neurologically stable with no deficits. D/c to facility.   Duffy Bruce, MD 12/31/21 [phone]

## 2021-12-31 NOTE — ED Notes (Signed)
[facility]  CALLED FOR  TRANSPORT  TO  [place]

## 2021-12-31 NOTE — Discharge Instructions (Addendum)
No aspirin or blood thinner use for the next week  Take the prescribed medication for headache  Follow-up with Neurosurgery as an outpatient

## 2021-12-31 NOTE — ED Notes (Signed)
EDP at bedside again reevaluating pt.

## 2021-12-31 NOTE — ED Provider Notes (Signed)
[facility] Provider Note    Event Date/Time   First MD Initiated Contact with Patient 12/31/21 0206     (approximate)   History   Fall   HPI  Lance [name] is a 76 y.o. male  With a history of prior stroke diabetes hepatitis C schizophrenia  He has significant debility and chronic issues with weakness in his left leg.  Today he was watching TV, he reports that he try to get from the TV towards a chair he fell and in so doing he struck the right forehead and his right elbow on a piece of furniture in front of them.  He did not pass out.  He has been in his normal health until that occurred and is actually doing very well with physical therapy  Denies headache no neck pain.  No numbness or weakness other than the chronic weakness in his left leg.  Denies any injuries to the arms or legs back neck or other areas except his right elbow is just slightly sore but demonstrates good use of it     Physical Exam   Triage Vital Signs: ED Triage Vitals  Enc Vitals Group     BP 12/30/21 1914 121/79     Pulse Rate 12/30/21 1914 94     Resp 12/30/21 1914 16     Temp 12/30/21 1914 98.9 F (37.2 C)     Temp Source 12/30/21 1914 Oral     SpO2 12/30/21 1914 97 %     Weight 12/30/21 1915 180 lb (81.6 kg)     Height 12/30/21 1915 '5\' 9"'$  (1.753 m)     Head Circumference --      Peak Flow --      Pain Score 12/30/21 1914 7     Pain Loc --      Pain Edu? --      Excl. in [place]? --     Most recent vital signs: Vitals:   12/31/21 0630 12/31/21 0715  BP: 131/86 (!) 154/89  Pulse: 84 82  Resp:  19  Temp:    SpO2: 97% 96%     General: Awake, no distress.  Very pleasant, just slightly flattened affect CV:  Good peripheral perfusion.  Normal heart tone Resp:  Normal effort.  Clear bilaterally speaks in full clear sentences Abd:  No distention.  Soft nontender Other:  3 out of 5 strength in the left lower extremity but no tenderness to bony palpation of all areas  patient reports this to be chronic, right leg 5 out of 5 strength no tenderness to palpation of any bony areas.  No traumatic injuries noted to the lower extremities bilateral.  Excellent use and atraumatic examination of the left upper extremity.  Right upper extremity demonstrates a very small skin tear over the elbow, perhaps may be 2 mm to 3 mm in size, and he demonstrates excellent range of motion of the elbow full through full flexion extension without pain or discomfort at this time   ED Results / Procedures / Treatments   Labs (all labs ordered are listed, but only abnormal results are displayed) Labs Reviewed  BASIC METABOLIC PANEL - Abnormal; Notable for the following components:      Result Value   Glucose, Bld 190 (*)    Creatinine, Ser 1.34 (*)    Calcium 8.7 (*)    GFR, Estimated 56 (*)    All other components within normal limits  CBC - Abnormal; Notable  for the following components:   WBC 11.0 (*)    RBC 3.99 (*)    Hemoglobin 11.6 (*)    HCT 35.8 (*)    All other components within normal limits  CBG MONITORING, ED - Abnormal; Notable for the following components:   Glucose-Capillary 162 (*)    All other components within normal limits  CBG MONITORING, ED - Abnormal; Notable for the following components:   Glucose-Capillary 231 (*)    All other components within normal limits  PROTIME-INR  APTT  TROPONIN I (HIGH SENSITIVITY)  TROPONIN I (HIGH SENSITIVITY)     EKG  Interpreted by me at 1930 heart rate 90 QRS 80 QTc 470 Normal sinus rhythm occasional sinus arrhythmia.  Left anterior fascicular block.  No evidence of acute ischemia slight artifact   RADIOLOGY  CT head interpreted by me, discussed with radiologist as well, small area of subarachnoid blood noted over the right frontal region  Patient's blood pressure well controlled.  Denies headache.  Neuro exam stable with weakness left lower extremity which is chronic  CT Head Wo Contrast  Addendum Date:  12/31/2021   ADDENDUM REPORT: 12/31/2021 01:57 ADDENDUM: Critical Value/emergent results were called by telephone at the time of interpretation on 12/31/2021 at 1:57 am to Dr. Jacqualine Code, Who verbally acknowledged these results. Electronically Signed   By: Ulyses Jarred M.D.   On: 12/31/2021 01:57   Result Date: 12/31/2021 CLINICAL DATA:  Fall EXAM: CT HEAD WITHOUT CONTRAST CT CERVICAL SPINE WITHOUT CONTRAST TECHNIQUE: Multidetector CT imaging of the head and cervical spine was performed following the standard protocol without intravenous contrast. Multiplanar CT image reconstructions of the cervical spine were also generated. RADIATION DOSE REDUCTION: This exam was performed according to the departmental dose-optimization program which includes automated exposure control, adjustment of the mA and/or kV according to patient size and/or use of iterative reconstruction technique. COMPARISON:  None Available. FINDINGS: CT HEAD FINDINGS Brain: Small focus of subarachnoid hemorrhage at the medial superior right frontal lobe. Large, sessile right anterior convexity meningioma is unchanged. Gliosis in the right frontal lobe is unchanged. There is generalized atrophy without lobar predilection. There is hypoattenuation of the periventricular white matter, most commonly indicating chronic ischemic microangiopathy. Vascular: No abnormal hyperdensity of the major intracranial arteries or dural venous sinuses. No intracranial atherosclerosis. Skull: The visualized skull base, calvarium and extracranial soft tissues are normal. Sinuses/Orbits: No fluid levels or advanced mucosal thickening of the visualized paranasal sinuses. No mastoid or middle ear effusion. The orbits are normal. CT CERVICAL SPINE FINDINGS Alignment: No static subluxation. Facets are aligned. Occipital condyles are normally positioned. Skull base and vertebrae: No acute fracture. Soft tissues and spinal canal: No prevertebral fluid or swelling. No visible canal  hematoma. Disc levels: Moderate degenerative change without spinal canal stenosis. Upper chest: No pneumothorax, pulmonary nodule or pleural effusion. Other: Normal visualized paraspinal cervical soft tissues. IMPRESSION: 1. Small focus of subarachnoid hemorrhage at the medial superior right frontal lobe. 2. Unchanged large right anterior convexity meningioma. 3. No acute fracture or static subluxation of the cervical spine. Electronically Signed: By: Ulyses Jarred M.D. On: 12/31/2021 01:51   CT Cervical Spine Wo Contrast  Addendum Date: 12/31/2021   ADDENDUM REPORT: 12/31/2021 01:57 ADDENDUM: Critical Value/emergent results were called by telephone at the time of interpretation on 12/31/2021 at 1:57 am to Dr. Jacqualine Code, Who verbally acknowledged these results. Electronically Signed   By: Ulyses Jarred M.D.   On: 12/31/2021 01:57   Result Date: 12/31/2021 CLINICAL DATA:  Fall EXAM: CT HEAD WITHOUT CONTRAST CT CERVICAL SPINE WITHOUT CONTRAST TECHNIQUE: Multidetector CT imaging of the head and cervical spine was performed following the standard protocol without intravenous contrast. Multiplanar CT image reconstructions of the cervical spine were also generated. RADIATION DOSE REDUCTION: This exam was performed according to the departmental dose-optimization program which includes automated exposure control, adjustment of the mA and/or kV according to patient size and/or use of iterative reconstruction technique. COMPARISON:  None Available. FINDINGS: CT HEAD FINDINGS Brain: Small focus of subarachnoid hemorrhage at the medial superior right frontal lobe. Large, sessile right anterior convexity meningioma is unchanged. Gliosis in the right frontal lobe is unchanged. There is generalized atrophy without lobar predilection. There is hypoattenuation of the periventricular white matter, most commonly indicating chronic ischemic microangiopathy. Vascular: No abnormal hyperdensity of the major intracranial arteries or dural  venous sinuses. No intracranial atherosclerosis. Skull: The visualized skull base, calvarium and extracranial soft tissues are normal. Sinuses/Orbits: No fluid levels or advanced mucosal thickening of the visualized paranasal sinuses. No mastoid or middle ear effusion. The orbits are normal. CT CERVICAL SPINE FINDINGS Alignment: No static subluxation. Facets are aligned. Occipital condyles are normally positioned. Skull base and vertebrae: No acute fracture. Soft tissues and spinal canal: No prevertebral fluid or swelling. No visible canal hematoma. Disc levels: Moderate degenerative change without spinal canal stenosis. Upper chest: No pneumothorax, pulmonary nodule or pleural effusion. Other: Normal visualized paraspinal cervical soft tissues. IMPRESSION: 1. Small focus of subarachnoid hemorrhage at the medial superior right frontal lobe. 2. Unchanged large right anterior convexity meningioma. 3. No acute fracture or static subluxation of the cervical spine. Electronically Signed: By: Ulyses Jarred M.D. On: 12/31/2021 01:51   DG Elbow Complete Right  Result Date: 12/30/2021 CLINICAL DATA:  fall right elbow pain EXAM: RIGHT ELBOW - COMPLETE 3+ VIEW COMPARISON:  None Available. FINDINGS: No evidence of fracture, dislocation, or joint effusion. No evidence of severe arthropathy. No aggressive appearing focal bone abnormality. Soft tissues are unremarkable. IMPRESSION: No acute displaced fracture or dislocation. Electronically Signed   By: Iven Finn M.D.   On: 12/30/2021 19:44    REPEAT CT at 659am IMPRESSION: 1. Trace amount of subarachnoid hemorrhage in the right frontal region, similar to prior examination. 2. Large right anterior meningioma redemonstrated. 3. Mild cerebral and cerebellar atrophy with chronic microvascular ischemic changes in the cerebral white matter, as above.     Electronically Signed   By: Vinnie Langton M.D.   On: 12/31/2021 06:59  PROCEDURES:  Critical Care  performed: Yes, see critical care procedure note(s)  CRITICAL CARE Performed by: Delman Kitten   Total critical care time: 35 minutes  Critical care time was exclusive of separately billable procedures and treating other patients.  Critical care was necessary to treat or prevent imminent or life-threatening deterioration.  Critical care was time spent personally by me on the following activities: development of treatment plan with patient and/or surrogate as well as nursing, discussions with consultants, evaluation of patient's response to treatment, examination of patient, obtaining history from patient or surrogate, ordering and performing treatments and interventions, ordering and review of laboratory studies, ordering and review of radiographic studies, pulse oximetry and re-evaluation of patient's condition.   Procedures   MEDICATIONS ORDERED IN ED: Medications  sodium chloride 0.9 % bolus 500 mL (has no administration in time range)  acetaminophen (TYLENOL) tablet 1,000 mg (has no administration in time range)  morphine (PF) 2 MG/ML injection 2 mg (has no administration in time range)  morphine (PF) 4 MG/ML injection 4 mg (4 mg Intravenous Given 12/31/21 5465)     IMPRESSION / MDM / ASSESSMENT AND PLAN / ED COURSE  I reviewed the triage vital signs and the nursing notes.                              Differential diagnosis includes, but is not limited to, injuries from falling, small skin tear of the right elbow, otherwise no other noted injury on physical inspection.  He does however report he struck his head did not lose consciousness, and is noted to have a right small subarachnoid hemorrhage.  Labs are reviewed and he is not on any anticoagulants on his medication list.  Coagulation studies normal  Labs notable for very mild leukocytosis and mild anemia.  Platelet count is normal   Very minimal reduction in GFR from previous baseline, no evidence of significant AKI  though  Patient's presentation is most consistent with acute presentation with potential threat to life or bodily function.  The patient is on the cardiac monitor to evaluate for evidence of arrhythmia and/or significant heart rate changes.  ----------------------------------------- 2:29 AM on 12/31/2021 ----------------------------------------- I discussed clinical history and imaging study results with Dr. Elayne Guerin of neurosurgery.  He recommends that a repeat CT scan be performed of the head in approximately 6 hours, and if this study does not show any worsening neurologic exam remained stable the patient may be discharged  Blood pressure currently normotensive   ----------------------------------------- 6:33 AM on 12/31/2021 ----------------------------------------- Patient reporting that he is now starting to have a headache.  He is alert and his neurologic exam is unchanged, but he now reports a moderate bifrontal headache that was not present earlier.  I have ordered a CT scan of the head be repeated at this time.   ----------------------------------------- 7:20 AM on 12/31/2021 ----------------------------------------- Repeat CT of the head shows stability.  Patient continues to have frontal headache.  We will give additional medications at this time.  Dr. Cari Caraway of neurosurgery advises he will see and provide consult on the patient after his first surgical case this morning.  We will continue to observe the patient closely for neurologic changes.  Ongoing care assigned to Dr. Ellender Hose, follow-up on consultation by neurosurgery  FINAL CLINICAL IMPRESSION(S) / ED DIAGNOSES   Final diagnoses:  Subarachnoid hemorrhage following injury, no loss of consciousness, initial encounter [facility])     Rx / DC Orders   ED Discharge Orders     None        Note:  This document was prepared using Dragon voice recognition software and may include unintentional dictation  errors.   Delman Kitten, MD 12/31/21 [phone]

## 2021-12-31 NOTE — ED Notes (Signed)
Pt in ct 

## 2021-12-31 NOTE — ED Notes (Signed)
Pt complains of worsening headache. Other neuro assessment remains unchanged. Md notified.

## 2022-02-04 ENCOUNTER — Other Ambulatory Visit: Payer: Self-pay | Admitting: Neurology

## 2022-02-04 DIAGNOSIS — M79605 Pain in left leg: Secondary | ICD-10-CM

## 2022-02-05 ENCOUNTER — Ambulatory Visit: Payer: Medicare HMO | Admitting: Neurosurgery

## 2022-02-13 ENCOUNTER — Ambulatory Visit
Admission: RE | Admit: 2022-02-13 | Discharge: 2022-02-13 | Disposition: A | Discharge status: Home / Self Care | Payer: Medicare Other | Source: Ambulatory Visit | Attending: Neurology | Admitting: Neurology

## 2022-02-13 DIAGNOSIS — M79605 Pain in left leg: Secondary | ICD-10-CM | POA: Diagnosis not present

## 2022-06-20 ENCOUNTER — Other Ambulatory Visit: Payer: Self-pay

## 2022-06-20 ENCOUNTER — Emergency Department
Admission: EM | Admit: 2022-06-20 | Discharge: 2022-06-21 | Disposition: A | Discharge status: Skilled Nursing Facility | Payer: Medicare Other | Attending: Emergency Medicine | Admitting: Emergency Medicine

## 2022-06-20 DIAGNOSIS — Z8619 Personal history of other infectious and parasitic diseases: Secondary | ICD-10-CM | POA: Insufficient documentation

## 2022-06-20 DIAGNOSIS — E119 Type 2 diabetes mellitus without complications: Secondary | ICD-10-CM | POA: Insufficient documentation

## 2022-06-20 DIAGNOSIS — Z1152 Encounter for screening for COVID-19: Secondary | ICD-10-CM | POA: Insufficient documentation

## 2022-06-20 DIAGNOSIS — U071 COVID-19: Secondary | ICD-10-CM | POA: Diagnosis not present

## 2022-06-20 DIAGNOSIS — R112 Nausea with vomiting, unspecified: Secondary | ICD-10-CM | POA: Diagnosis present

## 2022-06-20 DIAGNOSIS — I10 Essential (primary) hypertension: Secondary | ICD-10-CM | POA: Diagnosis not present

## 2022-06-20 DIAGNOSIS — K529 Noninfective gastroenteritis and colitis, unspecified: Secondary | ICD-10-CM | POA: Diagnosis not present

## 2022-06-20 LAB — COMPREHENSIVE METABOLIC PANEL
ALT: 20 U/L (ref 0–44)
AST: 15 U/L (ref 15–41)
Albumin: 3.6 g/dL (ref 3.5–5.0)
Alkaline Phosphatase: 127 U/L — ABNORMAL HIGH (ref 38–126)
Anion gap: 8 (ref 5–15)
BUN: 16 mg/dL (ref 8–23)
CO2: 28 mmol/L (ref 22–32)
Calcium: 8.9 mg/dL (ref 8.9–10.3)
Chloride: 105 mmol/L (ref 98–111)
Creatinine, Ser: 1.11 mg/dL (ref 0.61–1.24)
GFR, Estimated: >60 mL/min (ref >60)
Glucose, Bld: 157 mg/dL — ABNORMAL HIGH (ref 70–99)
Potassium: 4.1 mmol/L (ref 3.5–5.1)
Sodium: 141 mmol/L (ref 135–145)
Total Bilirubin: 0.5 mg/dL (ref 0.3–1.2)
Total Protein: 8.1 g/dL (ref 6.5–8.1)

## 2022-06-20 LAB — URINALYSIS, ROUTINE W REFLEX MICROSCOPIC
Bacteria, UA: NONE SEEN
Bilirubin Urine: NEGATIVE
Glucose, UA: 150 mg/dL — AB
Hgb urine dipstick: NEGATIVE
Ketones, ur: NEGATIVE mg/dL
Leukocytes,Ua: NEGATIVE
Nitrite: NEGATIVE
Protein, ur: 100 mg/dL — AB
Specific Gravity, Urine: 1.024 (ref 1.005–1.030)
Squamous Epithelial / HPF: NONE SEEN /HPF (ref 0–5)
pH: 6 (ref 5.0–8.0)

## 2022-06-20 LAB — LIPASE, BLOOD: Lipase: 29 U/L (ref 11–51)

## 2022-06-20 LAB — CBC
HCT: 38.1 % — ABNORMAL LOW (ref 39.0–52.0)
Hemoglobin: 12.1 g/dL — ABNORMAL LOW (ref 13.0–17.0)
MCH: 29.2 pg (ref 26.0–34.0)
MCHC: 31.8 g/dL (ref 30.0–36.0)
MCV: 92 fL (ref 80.0–100.0)
Platelets: 274 10*3/uL (ref 150–400)
RBC: 4.14 MIL/uL — ABNORMAL LOW (ref 4.22–5.81)
RDW: 12.8 % (ref 11.5–15.5)
WBC: 12.7 10*3/uL — ABNORMAL HIGH (ref 4.0–10.5)
nRBC: 0 % (ref 0.0–0.2)

## 2022-06-20 LAB — RESP PANEL BY RT-PCR (RSV, FLU A&B, COVID)  RVPGX2
Influenza A by PCR: NEGATIVE
Influenza B by PCR: NEGATIVE
Resp Syncytial Virus by PCR: NEGATIVE
SARS Coronavirus 2 by RT PCR: NEGATIVE

## 2022-06-20 MED ORDER — ONDANSETRON 4 MG PO TBDP: 4.0000 mg | ORAL_TABLET | Freq: Three times a day (TID) | ORAL | 0 refills | Status: AC | PRN

## 2022-06-20 MED ORDER — LACTATED RINGERS IV BOLUS
1000.0000 mL | Freq: Once | INTRAVENOUS | Status: AC
  Administered 2022-06-20: 1000 mL via INTRAVENOUS

## 2022-06-20 NOTE — ED Provider Notes (Signed)
[facility] Provider Note    Event Date/Time   First MD Initiated Contact with Patient 06/20/22 [phone]     (approximate)   History   Chief Complaint Emesis   HPI  Lance [name] is a 77 y.o. male with past medical history of hypertension, diabetes, stroke, hepatitis C, and schizophrenia who presents to the ED complaining of nausea and vomiting.  Patient reports that since last night he has been feeling persistently nauseous with multiple episodes of vomiting.  He reports some discomfort in his upper abdomen as well as diarrhea, has not noticed any blood in his emesis or stool.  He reports being told that he recently tested positive for COVID-19, is unsure exactly when he was tested.  He denies any fevers, cough, chest pain, or shortness of breath.  He reports feeling better following dose of IV Zofran with EMS, denies ongoing nausea.  EMS did report concern for coffee-ground emesis.     Physical Exam   Triage Vital Signs: ED Triage Vitals [06/20/22 0822]  Enc Vitals Group     BP (!) 136/90     Pulse Rate 99     Resp 19     Temp 97.8 F (36.6 C)     Temp Source Oral     SpO2 100 %     Weight      Height      Head Circumference      Peak Flow      Pain Score 9     Pain Loc      Pain Edu?      Excl. in [place]?     Most recent vital signs: Vitals:   06/20/22 1330 06/20/22 1336  BP: (!) 125/97   Pulse: 90   Resp: 20   Temp:  97.6 F (36.4 C)  SpO2: 98%     Constitutional: Alert and oriented. Eyes: Conjunctivae are normal. Head: Atraumatic. Nose: No congestion/rhinnorhea. Mouth/Throat: Mucous membranes are moist.  Cardiovascular: Normal rate, regular rhythm. Grossly normal heart sounds.  2+ radial pulses bilaterally. Respiratory: Normal respiratory effort.  No retractions. Lungs CTAB. Gastrointestinal: Soft and nontender. No distention. Musculoskeletal: No lower extremity tenderness nor edema.  Neurologic:  Normal speech and language. No  gross focal neurologic deficits are appreciated.    ED Results / Procedures / Treatments   Labs (all labs ordered are listed, but only abnormal results are displayed) Labs Reviewed  COMPREHENSIVE METABOLIC PANEL - Abnormal; Notable for the following components:      Result Value   Glucose, Bld 157 (*)    Alkaline Phosphatase 127 (*)    All other components within normal limits  CBC - Abnormal; Notable for the following components:   WBC 12.7 (*)    RBC 4.14 (*)    Hemoglobin 12.1 (*)    HCT 38.1 (*)    All other components within normal limits  URINALYSIS, ROUTINE W REFLEX MICROSCOPIC - Abnormal; Notable for the following components:   Color, Urine YELLOW (*)    APPearance CLEAR (*)    Glucose, UA 150 (*)    Protein, ur 100 (*)    All other components within normal limits  RESP PANEL BY RT-PCR (RSV, FLU A&B, COVID)  RVPGX2  LIPASE, BLOOD    PROCEDURES:  Critical Care performed: No  Procedures   MEDICATIONS ORDERED IN ED: Medications  lactated ringers bolus 1,000 mL (1,000 mLs Intravenous New Bag/Given 06/20/22 1028)     IMPRESSION / MDM /  ASSESSMENT AND PLAN / ED COURSE  I reviewed the triage vital signs and the nursing notes.                              77 y.o. male with past medical history of hypertension, diabetes, stroke, hepatitis C, and schizophrenia who presents to the ED complaining of nausea and vomiting since last night with some pain in his upper abdomen.  Patient's presentation is most consistent with acute presentation with potential threat to life or bodily function.  Differential diagnosis includes, but is not limited to, gastroenteritis, gastritis, dehydration, electrolyte abnormality, AKI, GI bleed, pancreatitis, hepatitis, cholecystitis, biliary colic, UTI, bowel obstruction.  Patient well-appearing and in no acute distress, vital signs are unremarkable.  He has a benign abdominal exam and reports nausea improved following Zofran with EMS.  We  will hold off on CT imaging as symptoms seem consistent with gastroenteritis associated with COVID-19.  Plan to hydrate with IV fluids and p.o. challenge, also check urinalysis.  Labs are reassuring with no significant anemia, leukocytosis, electrode abnormality, or AKI.  LFTs and lipase are unremarkable.  Patient able to tolerate oral intake without difficulty here in the ED, denies ongoing abdominal pain.  Urinalysis shows no signs of infection, COVID-19 testing is negative.  Patient is appropriate for discharge back to nursing facility and will be prescribed Zofran for use as needed.  He was counseled to return to the ED for new or worsening symptoms, patient agrees with plan.      FINAL CLINICAL IMPRESSION(S) / ED DIAGNOSES   Final diagnoses:  Gastroenteritis  Nausea, vomiting and diarrhea     Rx / DC Orders   ED Discharge Orders          Ordered    ondansetron (ZOFRAN-ODT) 4 MG disintegrating tablet  Every 8 hours PRN        06/20/22 1402             Note:  This document was prepared using Dragon voice recognition software and may include unintentional dictation errors.   Blake Divine, MD 06/20/22 [phone]

## 2022-06-20 NOTE — ED Triage Notes (Signed)
Pt presents to ED via [place] from [facility] with c/o of emesis since 0200 this morning. EMS states it possibly could represent coffee ground emesis. Pt is + for COVID at this time, pt tested 5 days ago. Pt denies blood thinner use.  Pt given '4mg'$  of Zofran 20G L hand CBG 160

## 2022-06-26 ENCOUNTER — Emergency Department: Payer: Medicare Other

## 2022-06-26 ENCOUNTER — Emergency Department
Admission: EM | Admit: 2022-06-26 | Discharge: 2022-06-27 | Disposition: A | Discharge status: Home / Self Care | Payer: Medicare Other | Attending: Emergency Medicine | Admitting: Emergency Medicine

## 2022-06-26 ENCOUNTER — Other Ambulatory Visit: Payer: Self-pay

## 2022-06-26 DIAGNOSIS — E119 Type 2 diabetes mellitus without complications: Secondary | ICD-10-CM | POA: Diagnosis not present

## 2022-06-26 DIAGNOSIS — D72829 Elevated white blood cell count, unspecified: Secondary | ICD-10-CM | POA: Diagnosis not present

## 2022-06-26 DIAGNOSIS — Z8673 Personal history of transient ischemic attack (TIA), and cerebral infarction without residual deficits: Secondary | ICD-10-CM | POA: Diagnosis not present

## 2022-06-26 DIAGNOSIS — U071 COVID-19: Secondary | ICD-10-CM | POA: Diagnosis not present

## 2022-06-26 DIAGNOSIS — Z794 Long term (current) use of insulin: Secondary | ICD-10-CM | POA: Diagnosis not present

## 2022-06-26 DIAGNOSIS — F209 Schizophrenia, unspecified: Secondary | ICD-10-CM | POA: Diagnosis not present

## 2022-06-26 DIAGNOSIS — R0602 Shortness of breath: Secondary | ICD-10-CM | POA: Diagnosis present

## 2022-06-26 LAB — BASIC METABOLIC PANEL
Anion gap: 10 (ref 5–15)
BUN: 28 mg/dL — ABNORMAL HIGH (ref 8–23)
CO2: 25 mmol/L (ref 22–32)
Calcium: 8.6 mg/dL — ABNORMAL LOW (ref 8.9–10.3)
Chloride: 104 mmol/L (ref 98–111)
Creatinine, Ser: 1.17 mg/dL (ref 0.61–1.24)
GFR, Estimated: >60 mL/min (ref >60)
Glucose, Bld: 164 mg/dL — ABNORMAL HIGH (ref 70–99)
Potassium: 3.9 mmol/L (ref 3.5–5.1)
Sodium: 139 mmol/L (ref 135–145)

## 2022-06-26 LAB — CBC
HCT: 39.5 % (ref 39.0–52.0)
Hemoglobin: 12.9 g/dL — ABNORMAL LOW (ref 13.0–17.0)
MCH: 29.5 pg (ref 26.0–34.0)
MCHC: 32.7 g/dL (ref 30.0–36.0)
MCV: 90.2 fL (ref 80.0–100.0)
Platelets: 335 10*3/uL (ref 150–400)
RBC: 4.38 MIL/uL (ref 4.22–5.81)
RDW: 12.5 % (ref 11.5–15.5)
WBC: 15 10*3/uL — ABNORMAL HIGH (ref 4.0–10.5)
nRBC: 0.1 % (ref 0.0–0.2)

## 2022-06-26 LAB — TROPONIN I (HIGH SENSITIVITY): Troponin I (High Sensitivity): 13 ng/L (ref <18)

## 2022-06-26 NOTE — ED Triage Notes (Signed)
Pt to ED via [facility] from [facility]. Pt comes off of COVID isolation tomorrow. Pt sent due to elevated WBC 15.1, cough, congestion, sore throat. Pt currently on rocephin.   EMS VS:  98.3 oral 198 CBG  HR 90 BP 148/80

## 2022-06-26 NOTE — ED Provider Notes (Signed)
[facility] Provider Note    Event Date/Time   First MD Initiated Contact with Patient 06/26/22 2036     (approximate)   History   Chief Complaint: Shortness of Breath and Abnormal Lab (WBC 15.1)   HPI  Lance [name] is a 77 y.o. male with a history of diabetes, schizophrenia, stroke who was sent to the ED from [facility] due to cough, congestion, sore throat, white blood cell count of 15,000 on outpatient labs.  Reviewing facility [facility], patient received Solu-Medrol 125 mg IM on 06/23/2022 due to wheezing.  Subsequently, on 06/25/2022 he was given prednisone 40 mg p.o. for an indication of elevated white blood cell count.  He is also received 2 doses of Rocephin over the last 36 hours.  He is on molnupiravir for COVID.     Physical Exam   Triage Vital Signs: ED Triage Vitals  Enc Vitals Group     BP 06/26/22 1811 133/74     Pulse Rate 06/26/22 1811 60     Resp 06/26/22 1811 18     Temp 06/26/22 1811 97.9 F (36.6 C)     Temp Source 06/26/22 1811 Oral     SpO2 06/26/22 1811 98 %     Weight --      Height --      Head Circumference --      Peak Flow --      Pain Score 06/26/22 1813 0     Pain Loc --      Pain Edu? --      Excl. in [place]? --     Most recent vital signs: Vitals:   06/26/22 1811 06/26/22 2031  BP: 133/74 (!) 133/97  Pulse: 60 (!) 116  Resp: 18 (!) 24  Temp: 97.9 F (36.6 C)   SpO2: 98% 100%    General: Awake, no distress.  CV:  Good peripheral perfusion.  Tachycardia heart rate 105.  Symmetric distal pulses Resp:  Normal effort.  Clear to auscultation bilaterally.  No expiratory wheezing, no stridor Abd:  No distention.  Soft nontender Other:  No lower extremity edema or calf tenderness.   ED Results / Procedures / Treatments   Labs (all labs ordered are listed, but only abnormal results are displayed) Labs Reviewed  BASIC METABOLIC PANEL - Abnormal; Notable for the following components:      Result Value    Glucose, Bld 164 (*)    BUN 28 (*)    Calcium 8.6 (*)    All other components within normal limits  CBC - Abnormal; Notable for the following components:   WBC 15.0 (*)    Hemoglobin 12.9 (*)    All other components within normal limits  TROPONIN I (HIGH SENSITIVITY)  TROPONIN I (HIGH SENSITIVITY)     EKG Interpreted by me Sinus tachycardia with frequent PJCs rate of 130.  Left axis, normal intervals.  Normal QRS ST segments and T waves.   RADIOLOGY Chest x-ray interpreted by me, appears normal.  Radiology report reviewed   PROCEDURES:  Procedures   MEDICATIONS ORDERED IN ED: Medications - No data to display   IMPRESSION / MDM / [place] / ED COURSE  I reviewed the triage vital signs and the nursing notes.                              Differential diagnosis includes, but is not limited to,  steroid side effect, pneumonia, persistent viral syndrome, dehydration, electrolyte abnormality  Patient's presentation is most consistent with acute presentation with potential threat to life or bodily function.  Patient sent to the ED due to leukocytosis, examination chest x-ray and serum labs are unremarkable.  Most likely I think this is a side effect from recent steroid use and not clinically significant.  Patient denies shortness of breath or chest pain or other acute complaints at this time.  He appears to be medically stable for continued outpatient management.  Heart rate is elevated, which on EKG appears to be related to frequent premature junctional beats, possibly related to cardiac irritability from recent viral illness or from steroid use.  Intervals are grossly okay, I think that continued supportive care can expect his condition to continue improving back toward baseline.  Doubt ACS PE dissection pericardial effusion or sepsis.  Doubt UTI, and this would very likely be sufficiently treated by the recent Rocephin use.       FINAL CLINICAL IMPRESSION(S) /  ED DIAGNOSES   Final diagnoses:  COVID-19 virus infection  Type 2 diabetes mellitus without complication, with long-term current use of insulin ([place])  Schizophrenia, unspecified type ([place])     Rx / DC Orders   ED Discharge Orders     None        Note:  This document was prepared using Dragon voice recognition software and may include unintentional dictation errors.   Carrie Mew, MD 06/26/22 2105

## 2022-06-26 NOTE — Discharge Instructions (Signed)
Your lab tests and chest xray and examination were okay today.  I suspect that your elevated white blood cell count is a side effect of recent steroid medications and not indicative of an acute infection. Continue to follow up with your regular medical provider to monitor your symptoms.

## 2022-06-26 NOTE — ED Notes (Signed)
called to [facility] for transport to [facility]/rep:richard.Marland Kitchen

## 2022-12-15 ENCOUNTER — Other Ambulatory Visit: Payer: Self-pay

## 2022-12-15 ENCOUNTER — Emergency Department: Payer: Medicare Other

## 2022-12-15 ENCOUNTER — Emergency Department
Admission: EM | Admit: 2022-12-15 | Discharge: 2022-12-15 | Disposition: A | Discharge status: Home / Self Care | Payer: Medicare Other | Attending: Emergency Medicine | Admitting: Emergency Medicine

## 2022-12-15 DIAGNOSIS — S01112A Laceration without foreign body of left eyelid and periocular area, initial encounter: Secondary | ICD-10-CM | POA: Insufficient documentation

## 2022-12-15 DIAGNOSIS — E119 Type 2 diabetes mellitus without complications: Secondary | ICD-10-CM | POA: Diagnosis not present

## 2022-12-15 DIAGNOSIS — W19XXXA Unspecified fall, initial encounter: Secondary | ICD-10-CM | POA: Insufficient documentation

## 2022-12-15 DIAGNOSIS — S0993XA Unspecified injury of face, initial encounter: Secondary | ICD-10-CM | POA: Diagnosis present

## 2022-12-15 DIAGNOSIS — S0990XA Unspecified injury of head, initial encounter: Secondary | ICD-10-CM | POA: Diagnosis not present

## 2022-12-15 DIAGNOSIS — R0781 Pleurodynia: Secondary | ICD-10-CM | POA: Diagnosis not present

## 2022-12-15 DIAGNOSIS — Y92121 Bathroom in nursing home as the place of occurrence of the external cause: Secondary | ICD-10-CM | POA: Insufficient documentation

## 2022-12-15 DIAGNOSIS — S0181XA Laceration without foreign body of other part of head, initial encounter: Secondary | ICD-10-CM

## 2022-12-15 LAB — CBC WITH DIFFERENTIAL/PLATELET
Abs Immature Granulocytes: 0.08 10*3/uL — ABNORMAL HIGH (ref 0.00–0.07)
Basophils Absolute: 0 10*3/uL (ref 0.0–0.1)
Basophils Relative: 0 %
Eosinophils Absolute: 0.2 10*3/uL (ref 0.0–0.5)
Eosinophils Relative: 2 %
HCT: 31.9 % — ABNORMAL LOW (ref 39.0–52.0)
Hemoglobin: 10.2 g/dL — ABNORMAL LOW (ref 13.0–17.0)
Immature Granulocytes: 1 %
Lymphocytes Relative: 15 %
Lymphs Abs: 1.9 10*3/uL (ref 0.7–4.0)
MCH: 28.9 pg (ref 26.0–34.0)
MCHC: 32 g/dL (ref 30.0–36.0)
MCV: 90.4 fL (ref 80.0–100.0)
Monocytes Absolute: 0.9 10*3/uL (ref 0.1–1.0)
Monocytes Relative: 7 %
Neutro Abs: 9.6 10*3/uL — ABNORMAL HIGH (ref 1.7–7.7)
Neutrophils Relative %: 75 %
Platelets: 285 10*3/uL (ref 150–400)
RBC: 3.53 MIL/uL — ABNORMAL LOW (ref 4.22–5.81)
RDW: 13.4 % (ref 11.5–15.5)
WBC: 12.7 10*3/uL — ABNORMAL HIGH (ref 4.0–10.5)
nRBC: 0 % (ref 0.0–0.2)

## 2022-12-15 LAB — BASIC METABOLIC PANEL
Anion gap: 7 (ref 5–15)
BUN: 20 mg/dL (ref 8–23)
CO2: 23 mmol/L (ref 22–32)
Calcium: 8.4 mg/dL — ABNORMAL LOW (ref 8.9–10.3)
Chloride: 104 mmol/L (ref 98–111)
Creatinine, Ser: 1.15 mg/dL (ref 0.61–1.24)
GFR, Estimated: >60 mL/min (ref >60)
Glucose, Bld: 192 mg/dL — ABNORMAL HIGH (ref 70–99)
Potassium: 5 mmol/L (ref 3.5–5.1)
Sodium: 134 mmol/L — ABNORMAL LOW (ref 135–145)

## 2022-12-15 LAB — URINALYSIS, ROUTINE W REFLEX MICROSCOPIC
Bacteria, UA: NONE SEEN
Bilirubin Urine: NEGATIVE
Glucose, UA: >=500 mg/dL — AB
Hgb urine dipstick: NEGATIVE
Ketones, ur: NEGATIVE mg/dL
Leukocytes,Ua: NEGATIVE
Nitrite: NEGATIVE
Protein, ur: 100 mg/dL — AB
Specific Gravity, Urine: 1.02 (ref 1.005–1.030)
pH: 5 (ref 5.0–8.0)

## 2022-12-15 MED ORDER — LIDOCAINE-EPINEPHRINE (PF) 2 %-1:200000 IJ SOLN
20.0000 mL | Freq: Once | INTRAMUSCULAR | Status: AC
  Administered 2022-12-15: 20 mL via INTRADERMAL
  Filled 2022-12-15: qty 20

## 2022-12-15 NOTE — ED Notes (Signed)
called to [facility] for transport to facility/[facility]/rep:richard.

## 2022-12-15 NOTE — ED Triage Notes (Signed)
First Nurse Note;  Pt via [facility] from [facility]. Pt here for a mechanical fall in the bathroom. Pt has 1in lac above L eye. Denies LOC. Denies blood thinner. Staff states he has been increasingly more confused the past couple of days.  160/93  87 HR  99% on RA 152 CBG

## 2022-12-15 NOTE — ED Provider Notes (Signed)
[facility] Emergency Department Provider Note     None    (approximate)   History   Fall   HPI  Lance [name] is a 77 y.o. male with a history of hypotension, diabetic neuropathy, diabetes, schizophrenia and CVA, presents to the ED via EMS from his facility.  Report from [facility] is that the patient had a mechanical fall in his bathroom.  He presents with a laceration over his left brow.  No reports of any LOC.  Patient does not use any blood thinners currently.  Staff reports the patient appears to be increasingly confused over the last few days.  Vital signs are stable according to EMS report.  Patient would endorse some right-sided rib pain to my nurse.   Physical Exam   Triage Vital Signs: ED Triage Vitals [12/15/22 1816]  Enc Vitals Group     BP 139/88     Pulse Rate 92     Resp 16     Temp 98 F (36.7 C)     Temp Source Oral     SpO2 98 %     Weight 180 lb 12.4 oz (82 kg)     Height 5\' 9"  (1.753 m)     Head Circumference      Peak Flow      Pain Score 8     Pain Loc      Pain Edu?      Excl. in [place]?     Most recent vital signs: Vitals:   12/15/22 2055 12/15/22 2335  BP: 138/84 (!) 142/75  Pulse: 85 93  Resp: 16 16  Temp: 97.6 F (36.4 C)   SpO2: 100%     General Awake, no distress. NAD HEENT NCAT, except for a 2 cm laceration to the lateral left brow.  Wound extends to the subcu exposing underlying galea.  PERRL. EOMI. No rhinorrhea. Mucous membranes are moist.  CV:  Good peripheral perfusion. RRR RESP:  Normal effort. CTA ABD:  No distention.  Nontender MSK:  Active range of motion of the extremities noted   ED Results / Procedures / Treatments   Labs (all labs ordered are listed, but only abnormal results are displayed) Labs Reviewed  BASIC METABOLIC PANEL - Abnormal; Notable for the following components:      Result Value   Sodium 134 (*)    Glucose, Bld 192 (*)    Calcium 8.4 (*)    All other  components within normal limits  CBC WITH DIFFERENTIAL/PLATELET - Abnormal; Notable for the following components:   WBC 12.7 (*)    RBC 3.53 (*)    Hemoglobin 10.2 (*)    HCT 31.9 (*)    Neutro Abs 9.6 (*)    Abs Immature Granulocytes 0.08 (*)    All other components within normal limits  URINALYSIS, ROUTINE W REFLEX MICROSCOPIC - Abnormal; Notable for the following components:   Color, Urine YELLOW (*)    APPearance CLEAR (*)    Glucose, UA >=500 (*)    Protein, ur 100 (*)    All other components within normal limits     EKG   RADIOLOGY  I personally viewed and evaluated these images as part of my medical decision making, as well as reviewing the written report by the radiologist.  ED Provider Interpretation: No acute findings  DG Ribs Unilateral W/Chest Right  Result Date: 12/15/2022 CLINICAL DATA:  Right rib pain, fall EXAM: RIGHT RIBS AND CHEST -  3+ VIEW COMPARISON:  06/26/2022 FINDINGS: Heart and mediastinal contours within normal limits. No confluent airspace opacities or effusion. No pneumothorax. No acute bony abnormality. No visible rib fracture. Degenerative changes in the shoulders bilaterally. IMPRESSION: No visible rib fracture. No effusions or pneumothorax. Electronically Signed   By: Charlett Nose M.D.   On: 12/15/2022 21:27   CT Head Wo Contrast  Result Date: 12/15/2022 CLINICAL DATA:  Fall EXAM: CT HEAD WITHOUT CONTRAST CT CERVICAL SPINE WITHOUT CONTRAST TECHNIQUE: Multidetector CT imaging of the head and cervical spine was performed following the standard protocol without intravenous contrast. Multiplanar CT image reconstructions of the cervical spine were also generated. RADIATION DOSE REDUCTION: This exam was performed according to the departmental dose-optimization program which includes automated exposure control, adjustment of the mA and/or kV according to patient size and/or use of iterative reconstruction technique. COMPARISON:  12/31/2021 FINDINGS: CT HEAD  FINDINGS Brain: No evidence of acute infarction, hemorrhage, or hydrocephalus. Unchanged, large meningioma overlying the right frontal pole (series 2, image 18). Extensive periventricular and deep white matter hypodensity. Vascular: No hyperdense vessel or unexpected calcification. Skull: Normal. Negative for fracture or focal lesion. Sinuses/Orbits: No acute finding. Other: Soft tissue laceration left brow (series 2, image 9) CT CERVICAL SPINE FINDINGS Alignment: Degenerative straightening of the normal cervical lordosis Skull base and vertebrae: No acute fracture. No primary bone lesion or focal pathologic process. Soft tissues and spinal canal: No prevertebral fluid or swelling. No visible canal hematoma. Disc levels: Bony ankylosis C5-C6 with otherwise mild disc degenerative change. Upper chest: Negative. Other: None. IMPRESSION: 1. No acute intracranial pathology. Small-vessel white matter disease. 2. Unchanged, large meningioma overlying the right frontal pole. 3. No fracture or static subluxation of the cervical spine. 4. Soft tissue laceration left brow. Electronically Signed   By: Jearld Lesch M.D.   On: 12/15/2022 18:43   CT Cervical Spine Wo Contrast  Result Date: 12/15/2022 CLINICAL DATA:  Fall EXAM: CT HEAD WITHOUT CONTRAST CT CERVICAL SPINE WITHOUT CONTRAST TECHNIQUE: Multidetector CT imaging of the head and cervical spine was performed following the standard protocol without intravenous contrast. Multiplanar CT image reconstructions of the cervical spine were also generated. RADIATION DOSE REDUCTION: This exam was performed according to the departmental dose-optimization program which includes automated exposure control, adjustment of the mA and/or kV according to patient size and/or use of iterative reconstruction technique. COMPARISON:  12/31/2021 FINDINGS: CT HEAD FINDINGS Brain: No evidence of acute infarction, hemorrhage, or hydrocephalus. Unchanged, large meningioma overlying the right  frontal pole (series 2, image 18). Extensive periventricular and deep white matter hypodensity. Vascular: No hyperdense vessel or unexpected calcification. Skull: Normal. Negative for fracture or focal lesion. Sinuses/Orbits: No acute finding. Other: Soft tissue laceration left brow (series 2, image 9) CT CERVICAL SPINE FINDINGS Alignment: Degenerative straightening of the normal cervical lordosis Skull base and vertebrae: No acute fracture. No primary bone lesion or focal pathologic process. Soft tissues and spinal canal: No prevertebral fluid or swelling. No visible canal hematoma. Disc levels: Bony ankylosis C5-C6 with otherwise mild disc degenerative change. Upper chest: Negative. Other: None. IMPRESSION: 1. No acute intracranial pathology. Small-vessel white matter disease. 2. Unchanged, large meningioma overlying the right frontal pole. 3. No fracture or static subluxation of the cervical spine. 4. Soft tissue laceration left brow. Electronically Signed   By: Jearld Lesch M.D.   On: 12/15/2022 18:43     PROCEDURES:  Critical Care performed: No  ..Laceration Repair  Date/Time: 12/15/2022 11:07 PM  Performed by: Ronnie Doss  Marcelyn Bruins, PA-C Authorized by: Lissa Hoard, PA-C   Consent:    Consent obtained:  Verbal   Consent given by:  Patient   Risks discussed:  Pain and poor cosmetic result Universal protocol:    Imaging studies available: yes     Site/side marked: yes     Patient identity confirmed:  Verbally with patient Anesthesia:    Anesthesia method:  Local infiltration   Local anesthetic:  Lidocaine 2% WITH epi Laceration details:    Location:  Face   Face location:  L eyebrow   Length (cm):  2   Depth (mm):  5 Pre-procedure details:    Preparation:  Patient was prepped and draped in usual sterile fashion Exploration:    Limited defect created (wound extended): no     Hemostasis achieved with:  Epinephrine   Contaminated: no   Treatment:    Area cleansed  with:  Saline   Amount of cleaning:  Standard   Irrigation solution:  Sterile saline   Irrigation volume:  10   Irrigation method:  Syringe   Debridement:  None   Undermining:  None   Scar revision: no   Skin repair:    Repair method:  Sutures   Suture size:  4-0   Suture material:  Nylon   Suture technique:  Simple interrupted   Number of sutures:  4 Approximation:    Approximation:  Close Repair type:    Repair type:  Simple Post-procedure details:    Dressing:  Non-adherent dressing   Procedure completion:  Tolerated well, no immediate complications    MEDICATIONS ORDERED IN ED: Medications  lidocaine-EPINEPHrine (XYLOCAINE W/EPI) 2 %-1:200000 (PF) injection 20 mL (20 mLs Intradermal Given 12/15/22 2247)     IMPRESSION / MDM / ASSESSMENT AND PLAN / ED COURSE  I reviewed the triage vital signs and the nursing notes.                              Differential diagnosis includes, but is not limited to, alcohol, illicit or prescription medications, or other toxic ingestion; intracranial pathology such as stroke or intracerebral hemorrhage; fever or infectious causes including sepsis; hypoxemia and/or hypercarbia; uremia; trauma; endocrine related disorders such as diabetes, hypoglycemia, and thyroid-related diseases; hypertensive encephalopathy; etc.   Patient's presentation is most consistent with acute complicated illness / injury requiring diagnostic workup.  Patient's diagnosis is consistent with mechanical fall resulting in facial contusion and laceration.  Geriatric patient to the ED from his facility for evaluation of his injuries.  Patient was evaluated with CT imaging of the head and neck as well as x-rays of the chest no acute intracranial or skeletal injury noted based on interpretation of images.  Patient labs did not reveal any acute abnormalities without any evidence of urosepsis the patient did have a mildly elevated white count which is nonspecific at this time,  given his chronic leukocytosis.  Patient alert, and engaged during his course in the ED he ate a sandwich tray as requested, and is stable now for discharge after suture repair.  Patient will be discharged home with instructions to take OTC Tylenol as needed.  Patient is to follow up with his primary provider for suture removal in 5 to 7 days as needed or otherwise directed. Patient is given ED precautions to return to the ED for any worsening or new symptoms.     FINAL CLINICAL IMPRESSION(S) / ED DIAGNOSES  Final diagnoses:  Fall in home, initial encounter  Injury of head, initial encounter  Facial laceration, initial encounter  Rib pain on right side     Rx / DC Orders   ED Discharge Orders     None        Note:  This document was prepared using Dragon voice recognition software and may include unintentional dictation errors.    Lissa Hoard, PA-C 12/16/22 0012    Willy Eddy, MD 12/19/22 1110

## 2022-12-15 NOTE — ED Notes (Signed)
Spoke to receptionist at [facility] to alert of transfer back.

## 2022-12-15 NOTE — ED Notes (Signed)
Spoke to nurse practitioner at [facility], NP. Available to 8 am [phone].   Can treat UTIs in the facility.

## 2022-12-15 NOTE — ED Triage Notes (Signed)
Pt to ed from [facility] via [facility], see first nurse note.   Pt is alert and answering questions as asked, in no acute distress in triage.

## 2022-12-15 NOTE — Discharge Instructions (Signed)
Your exam, labs, CTs and x-rays are negative and reassuring at this time but no signs of a serious injury related to your fall.  Your facial lacerations been repaired with sutures and they should be removed in 5 to 7 days.

## 2022-12-15 NOTE — ED Notes (Signed)
This tech performed peri care and dry brief applied. Wet pants were placed on a pt belongings bag. Paper pants were left at bedside whenever the pt is ready for dc.

## 2022-12-15 NOTE — ED Notes (Signed)
Patient states that he is having some head pain, r rib pain( but states it feels sore).

## 2022-12-23 ENCOUNTER — Ambulatory Visit: Payer: Medicare HMO | Admitting: Neurology

## 2023-09-26 ENCOUNTER — Other Ambulatory Visit: Payer: Self-pay | Admitting: Student

## 2023-09-26 DIAGNOSIS — G8194 Hemiplegia, unspecified affecting left nondominant side: Secondary | ICD-10-CM

## 2023-09-26 DIAGNOSIS — R519 Headache, unspecified: Secondary | ICD-10-CM

## 2023-09-26 DIAGNOSIS — R262 Difficulty in walking, not elsewhere classified: Secondary | ICD-10-CM

## 2023-10-09 ENCOUNTER — Ambulatory Visit
Admission: RE | Admit: 2023-10-09 | Discharge: 2023-10-09 | Disposition: A | Discharge status: Home / Self Care | Source: Ambulatory Visit | Attending: Student | Admitting: Student

## 2023-10-09 DIAGNOSIS — G8194 Hemiplegia, unspecified affecting left nondominant side: Secondary | ICD-10-CM | POA: Diagnosis present

## 2023-10-09 DIAGNOSIS — R519 Headache, unspecified: Secondary | ICD-10-CM

## 2023-10-09 DIAGNOSIS — R262 Difficulty in walking, not elsewhere classified: Secondary | ICD-10-CM

## 2023-10-09 MED ORDER — GADOBUTROL 1 MMOL/ML IV SOLN: 8.0000 mL | Freq: Once | INTRAVENOUS | Status: DC | PRN
